# Patient Record
Sex: Female | Born: 1950
Health system: Southern US, Community
[De-identification: ages and names within clinical notes are randomized; demographics above are authoritative.]

## PROBLEM LIST (undated history)

## (undated) DIAGNOSIS — Z9289 Personal history of other medical treatment: Secondary | ICD-10-CM

## (undated) DIAGNOSIS — F32A Depression, unspecified: Secondary | ICD-10-CM

## (undated) DIAGNOSIS — Z973 Presence of spectacles and contact lenses: Secondary | ICD-10-CM

## (undated) DIAGNOSIS — IMO0001 Reserved for inherently not codable concepts without codable children: Secondary | ICD-10-CM

## (undated) DIAGNOSIS — R7303 Prediabetes: Secondary | ICD-10-CM

## (undated) DIAGNOSIS — J189 Pneumonia, unspecified organism: Secondary | ICD-10-CM

## (undated) DIAGNOSIS — K589 Irritable bowel syndrome without diarrhea: Secondary | ICD-10-CM

## (undated) DIAGNOSIS — M797 Fibromyalgia: Secondary | ICD-10-CM

## (undated) DIAGNOSIS — E119 Type 2 diabetes mellitus without complications: Secondary | ICD-10-CM

## (undated) DIAGNOSIS — K219 Gastro-esophageal reflux disease without esophagitis: Secondary | ICD-10-CM

## (undated) DIAGNOSIS — Z8709 Personal history of other diseases of the respiratory system: Secondary | ICD-10-CM

## (undated) DIAGNOSIS — R42 Dizziness and giddiness: Secondary | ICD-10-CM

## (undated) DIAGNOSIS — R519 Headache, unspecified: Secondary | ICD-10-CM

## (undated) DIAGNOSIS — B1920 Unspecified viral hepatitis C without hepatic coma: Secondary | ICD-10-CM

## (undated) DIAGNOSIS — G8929 Other chronic pain: Secondary | ICD-10-CM

## (undated) DIAGNOSIS — M1990 Unspecified osteoarthritis, unspecified site: Secondary | ICD-10-CM

## (undated) DIAGNOSIS — J449 Chronic obstructive pulmonary disease, unspecified: Secondary | ICD-10-CM

## (undated) DIAGNOSIS — F329 Major depressive disorder, single episode, unspecified: Secondary | ICD-10-CM

## (undated) DIAGNOSIS — F419 Anxiety disorder, unspecified: Secondary | ICD-10-CM

## (undated) HISTORY — DX: Other chronic pain: G89.29

## (undated) HISTORY — DX: Unspecified osteoarthritis, unspecified site: M19.90

## (undated) HISTORY — DX: Prediabetes: R73.03

## (undated) HISTORY — DX: Irritable bowel syndrome, unspecified: K58.9

## (undated) HISTORY — DX: Unspecified viral hepatitis C without hepatic coma: B19.20

## (undated) HISTORY — DX: Depression, unspecified: F32.A

## (undated) HISTORY — PX: UPPER GASTROINTESTINAL ENDOSCOPY: SHX188

## (undated) HISTORY — DX: Fibromyalgia: M79.7

## (undated) HISTORY — PX: COLONOSCOPY: SHX174

## (undated) HISTORY — PX: TONSILLECTOMY: SUR1361

## (undated) HISTORY — DX: Gastro-esophageal reflux disease without esophagitis: K21.9

## (undated) HISTORY — PX: CHOLECYSTECTOMY: SHX55

## (undated) HISTORY — DX: Major depressive disorder, single episode, unspecified: F32.9

---

## 1979-09-06 HISTORY — PX: MANDIBLE SURGERY: SHX707

## 1998-11-24 ENCOUNTER — Inpatient Hospital Stay (HOSPITAL_COMMUNITY): Admission: EM | Admit: 1998-11-24 | Discharge: 1998-11-25 | Payer: Self-pay | Admitting: *Deleted

## 1998-11-24 ENCOUNTER — Encounter: Payer: Self-pay | Admitting: *Deleted

## 1999-08-31 ENCOUNTER — Inpatient Hospital Stay (HOSPITAL_COMMUNITY): Admission: EM | Admit: 1999-08-31 | Discharge: 1999-09-03 | Payer: Self-pay | Admitting: *Deleted

## 2001-12-17 ENCOUNTER — Ambulatory Visit (HOSPITAL_COMMUNITY): Admission: RE | Admit: 2001-12-17 | Discharge: 2001-12-17 | Payer: Self-pay | Admitting: Internal Medicine

## 2001-12-17 ENCOUNTER — Encounter: Payer: Self-pay | Admitting: Internal Medicine

## 2002-06-06 ENCOUNTER — Ambulatory Visit (HOSPITAL_COMMUNITY): Admission: RE | Admit: 2002-06-06 | Discharge: 2002-06-06 | Payer: Self-pay | Admitting: Internal Medicine

## 2002-06-06 ENCOUNTER — Encounter (INDEPENDENT_AMBULATORY_CARE_PROVIDER_SITE_OTHER): Payer: Self-pay | Admitting: Internal Medicine

## 2002-06-12 ENCOUNTER — Inpatient Hospital Stay (HOSPITAL_COMMUNITY): Admission: RE | Admit: 2002-06-12 | Discharge: 2002-06-14 | Payer: Self-pay | Admitting: General Surgery

## 2003-08-01 ENCOUNTER — Ambulatory Visit (HOSPITAL_COMMUNITY): Admission: RE | Admit: 2003-08-01 | Discharge: 2003-08-01 | Payer: Self-pay | Admitting: Family Medicine

## 2003-08-07 ENCOUNTER — Ambulatory Visit (HOSPITAL_COMMUNITY): Admission: RE | Admit: 2003-08-07 | Discharge: 2003-08-07 | Payer: Self-pay | Admitting: Family Medicine

## 2004-01-19 ENCOUNTER — Ambulatory Visit (HOSPITAL_COMMUNITY): Admission: RE | Admit: 2004-01-19 | Discharge: 2004-01-19 | Payer: Self-pay | Admitting: Internal Medicine

## 2004-05-18 ENCOUNTER — Ambulatory Visit (HOSPITAL_COMMUNITY): Admission: RE | Admit: 2004-05-18 | Discharge: 2004-05-18 | Payer: Self-pay | Admitting: Family Medicine

## 2004-08-10 ENCOUNTER — Ambulatory Visit: Payer: Self-pay | Admitting: Psychiatry

## 2004-09-16 ENCOUNTER — Ambulatory Visit (HOSPITAL_COMMUNITY): Admission: RE | Admit: 2004-09-16 | Discharge: 2004-09-16 | Payer: Self-pay | Admitting: Family Medicine

## 2004-10-12 ENCOUNTER — Ambulatory Visit: Payer: Self-pay | Admitting: Psychiatry

## 2004-11-19 ENCOUNTER — Ambulatory Visit: Payer: Self-pay | Admitting: Psychology

## 2005-01-06 ENCOUNTER — Ambulatory Visit: Payer: Self-pay | Admitting: Psychology

## 2005-01-17 ENCOUNTER — Ambulatory Visit: Payer: Self-pay | Admitting: Internal Medicine

## 2005-01-20 ENCOUNTER — Ambulatory Visit: Payer: Self-pay | Admitting: Psychology

## 2005-03-22 ENCOUNTER — Ambulatory Visit: Payer: Self-pay | Admitting: Psychiatry

## 2005-05-05 ENCOUNTER — Ambulatory Visit: Payer: Self-pay | Admitting: Psychology

## 2005-06-21 ENCOUNTER — Ambulatory Visit: Payer: Self-pay | Admitting: Psychiatry

## 2005-08-10 ENCOUNTER — Ambulatory Visit: Payer: Self-pay | Admitting: Psychology

## 2005-10-04 ENCOUNTER — Ambulatory Visit: Payer: Self-pay | Admitting: Internal Medicine

## 2005-10-13 ENCOUNTER — Ambulatory Visit: Payer: Self-pay | Admitting: Psychology

## 2005-10-28 ENCOUNTER — Ambulatory Visit (HOSPITAL_COMMUNITY): Admission: RE | Admit: 2005-10-28 | Discharge: 2005-10-28 | Payer: Self-pay | Admitting: Family Medicine

## 2005-11-10 ENCOUNTER — Ambulatory Visit (HOSPITAL_COMMUNITY): Payer: Self-pay | Admitting: Psychology

## 2005-11-30 ENCOUNTER — Ambulatory Visit (HOSPITAL_COMMUNITY): Payer: Self-pay | Admitting: Psychology

## 2005-12-21 ENCOUNTER — Ambulatory Visit (HOSPITAL_COMMUNITY): Payer: Self-pay | Admitting: Psychology

## 2006-01-03 ENCOUNTER — Ambulatory Visit (HOSPITAL_COMMUNITY): Payer: Self-pay | Admitting: Psychiatry

## 2006-01-11 ENCOUNTER — Ambulatory Visit (HOSPITAL_COMMUNITY): Payer: Self-pay | Admitting: Psychology

## 2006-02-01 ENCOUNTER — Ambulatory Visit (HOSPITAL_COMMUNITY): Payer: Self-pay | Admitting: Psychology

## 2006-02-22 ENCOUNTER — Ambulatory Visit (HOSPITAL_COMMUNITY): Payer: Self-pay | Admitting: Psychology

## 2006-03-09 ENCOUNTER — Ambulatory Visit (HOSPITAL_COMMUNITY): Payer: Self-pay | Admitting: Psychiatry

## 2006-03-15 ENCOUNTER — Ambulatory Visit: Payer: Self-pay | Admitting: Internal Medicine

## 2006-03-27 ENCOUNTER — Ambulatory Visit (HOSPITAL_COMMUNITY): Payer: Self-pay | Admitting: Psychology

## 2006-04-10 ENCOUNTER — Ambulatory Visit (HOSPITAL_COMMUNITY): Payer: Self-pay | Admitting: Psychology

## 2006-05-01 ENCOUNTER — Ambulatory Visit (HOSPITAL_COMMUNITY): Payer: Self-pay | Admitting: Psychology

## 2006-05-22 ENCOUNTER — Ambulatory Visit (HOSPITAL_COMMUNITY): Payer: Self-pay | Admitting: Psychology

## 2006-05-25 ENCOUNTER — Ambulatory Visit (HOSPITAL_COMMUNITY): Payer: Self-pay | Admitting: Psychiatry

## 2006-06-08 ENCOUNTER — Ambulatory Visit (HOSPITAL_COMMUNITY): Admission: RE | Admit: 2006-06-08 | Discharge: 2006-06-08 | Payer: Self-pay | Admitting: Family Medicine

## 2006-07-06 ENCOUNTER — Ambulatory Visit (HOSPITAL_COMMUNITY): Payer: Self-pay | Admitting: Psychology

## 2006-08-01 ENCOUNTER — Ambulatory Visit (HOSPITAL_COMMUNITY): Payer: Self-pay | Admitting: Psychology

## 2006-08-07 ENCOUNTER — Ambulatory Visit (HOSPITAL_COMMUNITY): Payer: Self-pay | Admitting: Psychology

## 2006-08-22 ENCOUNTER — Ambulatory Visit (HOSPITAL_COMMUNITY): Payer: Self-pay | Admitting: Psychiatry

## 2006-11-01 ENCOUNTER — Ambulatory Visit (HOSPITAL_COMMUNITY): Payer: Self-pay | Admitting: Psychology

## 2006-11-16 ENCOUNTER — Ambulatory Visit (HOSPITAL_COMMUNITY): Payer: Self-pay | Admitting: Psychiatry

## 2006-11-20 ENCOUNTER — Ambulatory Visit (HOSPITAL_COMMUNITY): Payer: Self-pay | Admitting: Psychology

## 2006-12-13 ENCOUNTER — Ambulatory Visit (HOSPITAL_COMMUNITY): Payer: Self-pay | Admitting: Psychology

## 2006-12-27 ENCOUNTER — Ambulatory Visit (HOSPITAL_COMMUNITY): Payer: Self-pay | Admitting: Psychology

## 2007-01-17 ENCOUNTER — Ambulatory Visit (HOSPITAL_COMMUNITY): Payer: Self-pay | Admitting: Psychology

## 2007-02-01 ENCOUNTER — Ambulatory Visit (HOSPITAL_COMMUNITY): Payer: Self-pay | Admitting: Psychiatry

## 2007-02-07 ENCOUNTER — Ambulatory Visit (HOSPITAL_COMMUNITY): Payer: Self-pay | Admitting: Psychology

## 2007-03-22 ENCOUNTER — Ambulatory Visit (HOSPITAL_COMMUNITY): Payer: Self-pay | Admitting: Psychology

## 2007-04-23 ENCOUNTER — Ambulatory Visit (HOSPITAL_COMMUNITY): Payer: Self-pay | Admitting: Psychology

## 2007-05-14 ENCOUNTER — Ambulatory Visit (HOSPITAL_COMMUNITY): Payer: Self-pay | Admitting: Psychology

## 2007-05-15 ENCOUNTER — Ambulatory Visit (HOSPITAL_COMMUNITY): Payer: Self-pay | Admitting: Psychiatry

## 2007-05-23 ENCOUNTER — Emergency Department (HOSPITAL_COMMUNITY): Admission: EM | Admit: 2007-05-23 | Discharge: 2007-05-23 | Payer: Self-pay | Admitting: Emergency Medicine

## 2007-06-04 ENCOUNTER — Ambulatory Visit (HOSPITAL_COMMUNITY): Payer: Self-pay | Admitting: Psychology

## 2007-06-25 ENCOUNTER — Ambulatory Visit (HOSPITAL_COMMUNITY): Payer: Self-pay | Admitting: Psychology

## 2007-07-12 ENCOUNTER — Ambulatory Visit (HOSPITAL_COMMUNITY): Payer: Self-pay | Admitting: Psychology

## 2007-07-30 ENCOUNTER — Ambulatory Visit (HOSPITAL_COMMUNITY): Admission: RE | Admit: 2007-07-30 | Discharge: 2007-07-30 | Payer: Self-pay | Admitting: Family Medicine

## 2007-08-13 ENCOUNTER — Ambulatory Visit (HOSPITAL_COMMUNITY): Payer: Self-pay | Admitting: Psychology

## 2007-08-14 ENCOUNTER — Ambulatory Visit (HOSPITAL_COMMUNITY): Payer: Self-pay | Admitting: Psychiatry

## 2007-09-18 ENCOUNTER — Ambulatory Visit (HOSPITAL_COMMUNITY): Payer: Self-pay | Admitting: Psychology

## 2007-10-02 ENCOUNTER — Ambulatory Visit (HOSPITAL_COMMUNITY): Payer: Self-pay | Admitting: Psychology

## 2007-10-16 ENCOUNTER — Ambulatory Visit (HOSPITAL_COMMUNITY): Payer: Self-pay | Admitting: Psychology

## 2007-11-13 ENCOUNTER — Ambulatory Visit (HOSPITAL_COMMUNITY): Payer: Self-pay | Admitting: Psychiatry

## 2007-11-26 ENCOUNTER — Ambulatory Visit (HOSPITAL_COMMUNITY): Payer: Self-pay | Admitting: Psychology

## 2008-01-10 ENCOUNTER — Ambulatory Visit (HOSPITAL_COMMUNITY): Payer: Self-pay | Admitting: Psychiatry

## 2008-01-15 ENCOUNTER — Ambulatory Visit (HOSPITAL_COMMUNITY): Payer: Self-pay | Admitting: Psychology

## 2008-01-30 ENCOUNTER — Ambulatory Visit (HOSPITAL_COMMUNITY): Payer: Self-pay | Admitting: Psychology

## 2008-02-07 ENCOUNTER — Ambulatory Visit (HOSPITAL_COMMUNITY): Payer: Self-pay | Admitting: Psychiatry

## 2008-02-13 ENCOUNTER — Ambulatory Visit (HOSPITAL_COMMUNITY): Payer: Self-pay | Admitting: Psychology

## 2008-03-19 ENCOUNTER — Ambulatory Visit (HOSPITAL_COMMUNITY): Payer: Self-pay | Admitting: Psychology

## 2008-04-07 ENCOUNTER — Ambulatory Visit (HOSPITAL_COMMUNITY): Payer: Self-pay | Admitting: Psychology

## 2008-05-07 ENCOUNTER — Ambulatory Visit (HOSPITAL_COMMUNITY): Payer: Self-pay | Admitting: Psychology

## 2008-05-08 ENCOUNTER — Ambulatory Visit (HOSPITAL_COMMUNITY): Payer: Self-pay | Admitting: Psychiatry

## 2008-08-06 ENCOUNTER — Ambulatory Visit (HOSPITAL_COMMUNITY): Payer: Self-pay | Admitting: Psychology

## 2008-08-20 ENCOUNTER — Ambulatory Visit (HOSPITAL_COMMUNITY): Payer: Self-pay | Admitting: Psychology

## 2008-09-09 ENCOUNTER — Ambulatory Visit (HOSPITAL_COMMUNITY): Payer: Self-pay | Admitting: Psychiatry

## 2008-09-19 ENCOUNTER — Ambulatory Visit (HOSPITAL_COMMUNITY): Payer: Self-pay | Admitting: Psychology

## 2008-10-07 ENCOUNTER — Ambulatory Visit (HOSPITAL_COMMUNITY): Payer: Self-pay | Admitting: Psychology

## 2008-11-11 ENCOUNTER — Ambulatory Visit (HOSPITAL_COMMUNITY): Payer: Self-pay | Admitting: Psychology

## 2008-12-04 ENCOUNTER — Ambulatory Visit (HOSPITAL_COMMUNITY): Payer: Self-pay | Admitting: Psychiatry

## 2009-01-07 ENCOUNTER — Ambulatory Visit (HOSPITAL_COMMUNITY): Payer: Self-pay | Admitting: Psychology

## 2009-01-28 ENCOUNTER — Ambulatory Visit (HOSPITAL_COMMUNITY): Payer: Self-pay | Admitting: Psychology

## 2009-02-18 ENCOUNTER — Ambulatory Visit (HOSPITAL_COMMUNITY): Admission: RE | Admit: 2009-02-18 | Discharge: 2009-02-18 | Payer: Self-pay | Admitting: Family Medicine

## 2009-02-19 ENCOUNTER — Ambulatory Visit (HOSPITAL_COMMUNITY): Payer: Self-pay | Admitting: Psychology

## 2009-03-03 ENCOUNTER — Ambulatory Visit (HOSPITAL_COMMUNITY): Payer: Self-pay | Admitting: Psychiatry

## 2009-03-19 ENCOUNTER — Ambulatory Visit (HOSPITAL_COMMUNITY): Payer: Self-pay | Admitting: Psychiatry

## 2009-03-26 ENCOUNTER — Ambulatory Visit (HOSPITAL_COMMUNITY): Payer: Self-pay | Admitting: Psychology

## 2009-04-22 ENCOUNTER — Emergency Department (HOSPITAL_COMMUNITY): Admission: EM | Admit: 2009-04-22 | Discharge: 2009-04-23 | Payer: Self-pay | Admitting: Emergency Medicine

## 2009-04-28 ENCOUNTER — Ambulatory Visit (HOSPITAL_COMMUNITY): Payer: Self-pay | Admitting: Psychiatry

## 2009-05-03 ENCOUNTER — Emergency Department (HOSPITAL_COMMUNITY): Admission: EM | Admit: 2009-05-03 | Discharge: 2009-05-03 | Payer: Self-pay | Admitting: Emergency Medicine

## 2009-05-14 ENCOUNTER — Ambulatory Visit (HOSPITAL_COMMUNITY): Payer: Self-pay | Admitting: Psychiatry

## 2009-05-19 ENCOUNTER — Ambulatory Visit (HOSPITAL_COMMUNITY): Payer: Self-pay | Admitting: Psychology

## 2009-06-19 ENCOUNTER — Ambulatory Visit (HOSPITAL_COMMUNITY): Payer: Self-pay | Admitting: Psychology

## 2009-06-25 ENCOUNTER — Ambulatory Visit (HOSPITAL_COMMUNITY): Payer: Self-pay | Admitting: Psychiatry

## 2009-07-06 ENCOUNTER — Ambulatory Visit (HOSPITAL_COMMUNITY): Payer: Self-pay | Admitting: Psychology

## 2009-08-13 ENCOUNTER — Ambulatory Visit (HOSPITAL_COMMUNITY): Payer: Self-pay | Admitting: Psychology

## 2009-08-20 ENCOUNTER — Ambulatory Visit (HOSPITAL_COMMUNITY): Admission: RE | Admit: 2009-08-20 | Discharge: 2009-08-20 | Payer: Self-pay | Admitting: Family Medicine

## 2009-09-22 ENCOUNTER — Ambulatory Visit (HOSPITAL_COMMUNITY): Payer: Self-pay | Admitting: Psychiatry

## 2009-11-25 ENCOUNTER — Ambulatory Visit (HOSPITAL_COMMUNITY): Payer: Self-pay | Admitting: Psychology

## 2009-12-16 ENCOUNTER — Ambulatory Visit (HOSPITAL_COMMUNITY): Payer: Self-pay | Admitting: Psychology

## 2010-01-06 ENCOUNTER — Ambulatory Visit (HOSPITAL_COMMUNITY): Payer: Self-pay | Admitting: Psychology

## 2010-01-07 ENCOUNTER — Ambulatory Visit (HOSPITAL_COMMUNITY): Payer: Self-pay | Admitting: Psychiatry

## 2010-02-08 ENCOUNTER — Ambulatory Visit (HOSPITAL_COMMUNITY): Payer: Self-pay | Admitting: Psychology

## 2010-02-09 ENCOUNTER — Ambulatory Visit (HOSPITAL_COMMUNITY): Payer: Self-pay | Admitting: Psychiatry

## 2010-03-01 ENCOUNTER — Ambulatory Visit (HOSPITAL_COMMUNITY): Payer: Self-pay | Admitting: Psychology

## 2010-03-16 ENCOUNTER — Ambulatory Visit (HOSPITAL_COMMUNITY): Payer: Self-pay | Admitting: Psychology

## 2010-04-06 ENCOUNTER — Ambulatory Visit (HOSPITAL_COMMUNITY): Payer: Self-pay | Admitting: Psychiatry

## 2010-04-07 ENCOUNTER — Ambulatory Visit (HOSPITAL_COMMUNITY): Payer: Self-pay | Admitting: Psychology

## 2010-04-28 ENCOUNTER — Ambulatory Visit (HOSPITAL_COMMUNITY): Payer: Self-pay | Admitting: Psychology

## 2010-05-18 ENCOUNTER — Ambulatory Visit (HOSPITAL_COMMUNITY): Payer: Self-pay | Admitting: Psychology

## 2010-05-21 ENCOUNTER — Ambulatory Visit (HOSPITAL_COMMUNITY): Admission: RE | Admit: 2010-05-21 | Discharge: 2010-05-21 | Payer: Self-pay | Admitting: Family Medicine

## 2010-05-24 ENCOUNTER — Inpatient Hospital Stay (HOSPITAL_COMMUNITY): Admission: RE | Admit: 2010-05-24 | Discharge: 2010-05-28 | Payer: Self-pay | Admitting: Orthopedic Surgery

## 2010-06-24 ENCOUNTER — Ambulatory Visit (HOSPITAL_COMMUNITY): Payer: Self-pay | Admitting: Psychology

## 2010-08-11 ENCOUNTER — Ambulatory Visit (HOSPITAL_COMMUNITY): Payer: Self-pay | Admitting: Psychology

## 2010-08-24 ENCOUNTER — Ambulatory Visit (HOSPITAL_COMMUNITY): Payer: Self-pay | Admitting: Psychiatry

## 2010-08-25 ENCOUNTER — Ambulatory Visit (HOSPITAL_COMMUNITY): Payer: Self-pay | Admitting: Psychology

## 2010-09-14 ENCOUNTER — Ambulatory Visit: Admit: 2010-09-14 | Payer: Self-pay | Admitting: Internal Medicine

## 2010-09-21 ENCOUNTER — Ambulatory Visit (HOSPITAL_COMMUNITY)
Admission: RE | Admit: 2010-09-21 | Discharge: 2010-09-21 | Payer: Self-pay | Source: Home / Self Care | Attending: Psychiatry | Admitting: Psychiatry

## 2010-09-22 ENCOUNTER — Ambulatory Visit (HOSPITAL_COMMUNITY): Admit: 2010-09-22 | Payer: Self-pay | Admitting: Psychology

## 2010-09-24 ENCOUNTER — Ambulatory Visit (HOSPITAL_COMMUNITY)
Admission: RE | Admit: 2010-09-24 | Discharge: 2010-09-24 | Payer: Self-pay | Source: Home / Self Care | Attending: Psychology | Admitting: Psychology

## 2010-09-25 ENCOUNTER — Encounter: Payer: Self-pay | Admitting: Family Medicine

## 2010-09-26 ENCOUNTER — Encounter: Payer: Self-pay | Admitting: Family Medicine

## 2010-10-11 ENCOUNTER — Encounter (INDEPENDENT_AMBULATORY_CARE_PROVIDER_SITE_OTHER): Payer: Medicare Other | Admitting: Psychology

## 2010-10-11 DIAGNOSIS — F3289 Other specified depressive episodes: Secondary | ICD-10-CM

## 2010-10-11 DIAGNOSIS — F329 Major depressive disorder, single episode, unspecified: Secondary | ICD-10-CM

## 2010-10-11 DIAGNOSIS — F411 Generalized anxiety disorder: Secondary | ICD-10-CM

## 2010-10-19 ENCOUNTER — Ambulatory Visit (INDEPENDENT_AMBULATORY_CARE_PROVIDER_SITE_OTHER): Payer: Medicare Other | Admitting: Internal Medicine

## 2010-10-19 DIAGNOSIS — K219 Gastro-esophageal reflux disease without esophagitis: Secondary | ICD-10-CM

## 2010-10-19 DIAGNOSIS — B182 Chronic viral hepatitis C: Secondary | ICD-10-CM

## 2010-10-25 ENCOUNTER — Encounter (INDEPENDENT_AMBULATORY_CARE_PROVIDER_SITE_OTHER): Payer: Medicare Other | Admitting: Psychology

## 2010-10-25 DIAGNOSIS — F329 Major depressive disorder, single episode, unspecified: Secondary | ICD-10-CM

## 2010-10-25 DIAGNOSIS — F411 Generalized anxiety disorder: Secondary | ICD-10-CM

## 2010-11-08 ENCOUNTER — Encounter (INDEPENDENT_AMBULATORY_CARE_PROVIDER_SITE_OTHER): Payer: Medicare Other | Admitting: Psychology

## 2010-11-08 DIAGNOSIS — F411 Generalized anxiety disorder: Secondary | ICD-10-CM

## 2010-11-08 DIAGNOSIS — F329 Major depressive disorder, single episode, unspecified: Secondary | ICD-10-CM

## 2010-11-16 ENCOUNTER — Encounter (INDEPENDENT_AMBULATORY_CARE_PROVIDER_SITE_OTHER): Payer: Medicare Other | Admitting: Psychiatry

## 2010-11-16 DIAGNOSIS — F332 Major depressive disorder, recurrent severe without psychotic features: Secondary | ICD-10-CM

## 2010-11-18 LAB — TYPE AND SCREEN
ABO/RH(D): A NEG
Antibody Screen: NEGATIVE

## 2010-11-18 LAB — COMPREHENSIVE METABOLIC PANEL
ALT: 27 U/L (ref 0–35)
Alkaline Phosphatase: 67 U/L (ref 39–117)
CO2: 30 mEq/L (ref 19–32)
Chloride: 101 mEq/L (ref 96–112)
GFR calc non Af Amer: >60 mL/min (ref >60)
Glucose, Bld: 95 mg/dL (ref 70–99)
Potassium: 4.9 mEq/L (ref 3.5–5.1)
Sodium: 138 mEq/L (ref 135–145)
Total Protein: 7.8 g/dL (ref 6.0–8.3)

## 2010-11-18 LAB — CBC
HCT: 25.5 % — ABNORMAL LOW (ref 36.0–46.0)
HCT: 37.8 % (ref 36.0–46.0)
Hemoglobin: 13 g/dL (ref 12.0–15.0)
MCH: 29 pg (ref 26.0–34.0)
MCH: 29.3 pg (ref 26.0–34.0)
MCHC: 34 g/dL (ref 30.0–36.0)
MCHC: 34.3 g/dL (ref 30.0–36.0)
MCV: 85.4 fL (ref 78.0–100.0)
Platelets: 184 10*3/uL (ref 150–400)
Platelets: 184 10*3/uL (ref 150–400)
Platelets: 220 10*3/uL (ref 150–400)
RBC: 4.41 MIL/uL (ref 3.87–5.11)
RDW: 11.9 % (ref 11.5–15.5)
RDW: 11.9 % (ref 11.5–15.5)
RDW: 12 % (ref 11.5–15.5)
WBC: 6.4 10*3/uL (ref 4.0–10.5)
WBC: 6.4 10*3/uL (ref 4.0–10.5)

## 2010-11-18 LAB — BASIC METABOLIC PANEL
BUN: 4 mg/dL — ABNORMAL LOW (ref 6–23)
BUN: 5 mg/dL — ABNORMAL LOW (ref 6–23)
BUN: 8 mg/dL (ref 6–23)
CO2: 30 mEq/L (ref 19–32)
CO2: 33 mEq/L — ABNORMAL HIGH (ref 19–32)
Calcium: 8.2 mg/dL — ABNORMAL LOW (ref 8.4–10.5)
Calcium: 8.6 mg/dL (ref 8.4–10.5)
Chloride: 97 mEq/L (ref 96–112)
Creatinine, Ser: 0.63 mg/dL (ref 0.4–1.2)
Creatinine, Ser: 0.66 mg/dL (ref 0.4–1.2)
Creatinine, Ser: 0.67 mg/dL (ref 0.4–1.2)
GFR calc non Af Amer: >60 mL/min (ref >60)
GFR calc non Af Amer: >60 mL/min (ref >60)
Glucose, Bld: 139 mg/dL — ABNORMAL HIGH (ref 70–99)
Glucose, Bld: 147 mg/dL — ABNORMAL HIGH (ref 70–99)
Potassium: 4.3 mEq/L (ref 3.5–5.1)

## 2010-11-18 LAB — URINALYSIS, ROUTINE W REFLEX MICROSCOPIC
Glucose, UA: NEGATIVE mg/dL
pH: 6.5 (ref 5.0–8.0)

## 2010-11-18 LAB — PROTIME-INR
INR: 0.98 (ref 0.00–1.49)
INR: 1.04 (ref 0.00–1.49)
Prothrombin Time: 13.2 seconds (ref 11.6–15.2)
Prothrombin Time: 12.3 seconds (ref 11.6–15.2)

## 2010-11-22 ENCOUNTER — Other Ambulatory Visit (INDEPENDENT_AMBULATORY_CARE_PROVIDER_SITE_OTHER): Payer: Self-pay | Admitting: Internal Medicine

## 2010-11-22 DIAGNOSIS — B182 Chronic viral hepatitis C: Secondary | ICD-10-CM

## 2010-11-30 ENCOUNTER — Other Ambulatory Visit (INDEPENDENT_AMBULATORY_CARE_PROVIDER_SITE_OTHER): Payer: Self-pay | Admitting: Internal Medicine

## 2010-11-30 ENCOUNTER — Ambulatory Visit (HOSPITAL_COMMUNITY)
Admission: RE | Admit: 2010-11-30 | Discharge: 2010-11-30 | Disposition: A | Discharge status: Home / Self Care | Payer: Medicare Other | Source: Ambulatory Visit | Attending: Internal Medicine | Admitting: Internal Medicine

## 2010-11-30 ENCOUNTER — Other Ambulatory Visit: Payer: Self-pay | Admitting: Interventional Radiology

## 2010-11-30 ENCOUNTER — Ambulatory Visit (HOSPITAL_COMMUNITY): Payer: Medicare Other

## 2010-11-30 DIAGNOSIS — Z01812 Encounter for preprocedural laboratory examination: Secondary | ICD-10-CM | POA: Insufficient documentation

## 2010-11-30 DIAGNOSIS — B182 Chronic viral hepatitis C: Secondary | ICD-10-CM | POA: Insufficient documentation

## 2010-11-30 DIAGNOSIS — Z79899 Other long term (current) drug therapy: Secondary | ICD-10-CM | POA: Insufficient documentation

## 2010-11-30 LAB — PROTIME-INR: Prothrombin Time: 12.2 seconds (ref 11.6–15.2)

## 2010-11-30 LAB — CBC
HCT: 40.1 % (ref 36.0–46.0)
Hemoglobin: 12.9 g/dL (ref 12.0–15.0)
MCH: 26.8 pg (ref 26.0–34.0)
MCV: 83.2 fL (ref 78.0–100.0)
RBC: 4.82 MIL/uL (ref 3.87–5.11)

## 2010-12-01 ENCOUNTER — Encounter (INDEPENDENT_AMBULATORY_CARE_PROVIDER_SITE_OTHER): Payer: Medicare Other | Admitting: Psychology

## 2010-12-01 ENCOUNTER — Encounter (HOSPITAL_COMMUNITY): Payer: Medicare Other | Admitting: Psychology

## 2010-12-01 DIAGNOSIS — F329 Major depressive disorder, single episode, unspecified: Secondary | ICD-10-CM

## 2010-12-01 DIAGNOSIS — F411 Generalized anxiety disorder: Secondary | ICD-10-CM

## 2010-12-11 LAB — STOOL CULTURE

## 2010-12-11 LAB — BASIC METABOLIC PANEL
CO2: 28 mEq/L (ref 19–32)
CO2: 29 mEq/L (ref 19–32)
Calcium: 9.2 mg/dL (ref 8.4–10.5)
Chloride: 100 mEq/L (ref 96–112)
Chloride: 98 mEq/L (ref 96–112)
GFR calc Af Amer: >60 mL/min (ref >60)
GFR calc Af Amer: >60 mL/min (ref >60)
Potassium: 3.6 mEq/L (ref 3.5–5.1)
Sodium: 135 mEq/L (ref 135–145)
Sodium: 136 mEq/L (ref 135–145)

## 2010-12-11 LAB — CBC
HCT: 41.3 % (ref 36.0–46.0)
Hemoglobin: 12.7 g/dL (ref 12.0–15.0)
Hemoglobin: 13.9 g/dL (ref 12.0–15.0)
MCHC: 33.7 g/dL (ref 30.0–36.0)
MCHC: 34.6 g/dL (ref 30.0–36.0)
MCV: 85.2 fL (ref 78.0–100.0)
RBC: 4.36 MIL/uL (ref 3.87–5.11)
RBC: 4.86 MIL/uL (ref 3.87–5.11)

## 2010-12-11 LAB — DIFFERENTIAL
Basophils Relative: 0 % (ref 0–1)
Basophils Relative: 0 % (ref 0–1)
Eosinophils Absolute: 0 10*3/uL (ref 0.0–0.7)
Lymphs Abs: 1.4 10*3/uL (ref 0.7–4.0)
Monocytes Absolute: 0.6 10*3/uL (ref 0.1–1.0)
Monocytes Absolute: 0.8 10*3/uL (ref 0.1–1.0)
Monocytes Relative: 10 % (ref 3–12)
Monocytes Relative: 7 % (ref 3–12)
Neutro Abs: 4.5 10*3/uL (ref 1.7–7.7)

## 2010-12-11 LAB — RAPID URINE DRUG SCREEN, HOSP PERFORMED
Amphetamines: NOT DETECTED
Tetrahydrocannabinol: NOT DETECTED

## 2010-12-11 LAB — CLOSTRIDIUM DIFFICILE EIA

## 2010-12-11 LAB — OVA AND PARASITE EXAMINATION: Ova and parasites: NONE SEEN

## 2010-12-17 ENCOUNTER — Encounter (HOSPITAL_COMMUNITY): Payer: Medicare Other | Admitting: Psychology

## 2011-01-04 ENCOUNTER — Ambulatory Visit (INDEPENDENT_AMBULATORY_CARE_PROVIDER_SITE_OTHER): Payer: Medicare Other | Admitting: Internal Medicine

## 2011-01-06 ENCOUNTER — Ambulatory Visit (INDEPENDENT_AMBULATORY_CARE_PROVIDER_SITE_OTHER): Payer: Medicare Other | Admitting: Internal Medicine

## 2011-01-06 DIAGNOSIS — B182 Chronic viral hepatitis C: Secondary | ICD-10-CM

## 2011-01-07 ENCOUNTER — Encounter (INDEPENDENT_AMBULATORY_CARE_PROVIDER_SITE_OTHER): Payer: Medicare Other | Admitting: Psychology

## 2011-01-07 DIAGNOSIS — F329 Major depressive disorder, single episode, unspecified: Secondary | ICD-10-CM

## 2011-01-07 DIAGNOSIS — F411 Generalized anxiety disorder: Secondary | ICD-10-CM

## 2011-01-11 ENCOUNTER — Encounter (INDEPENDENT_AMBULATORY_CARE_PROVIDER_SITE_OTHER): Payer: Medicare Other | Admitting: Psychiatry

## 2011-01-11 DIAGNOSIS — F331 Major depressive disorder, recurrent, moderate: Secondary | ICD-10-CM

## 2011-01-21 ENCOUNTER — Encounter (INDEPENDENT_AMBULATORY_CARE_PROVIDER_SITE_OTHER): Payer: Medicare Other | Admitting: Psychology

## 2011-01-21 DIAGNOSIS — F329 Major depressive disorder, single episode, unspecified: Secondary | ICD-10-CM

## 2011-01-21 DIAGNOSIS — F411 Generalized anxiety disorder: Secondary | ICD-10-CM

## 2011-01-21 NOTE — Consult Note (Signed)
NAME:  Lauren [name], Lauren [name]                      ACCOUNT NO.:  [id number]   MEDICAL RECORD NO.:  [id number]                  PATIENT TYPE:   LOCATION:                                       FACILITY:  [facility]   PHYSICIAN:  Lionel December, M.D.                 DATE OF BIRTH:  [birth year]   DATE OF CONSULTATION:  12/23/2003  DATE OF DISCHARGE:                                   CONSULTATION   GASTROENTEROLOGY CONSULTATION:   REFERRING PHYSICIAN:  Corrie Mckusick, M.D.   CHIEF COMPLAINT:  Follow up chronic hepatitis C, due for screening  colonoscopy.   HISTORY OF PRESENT ILLNESS:  Ms. Lauren [name] is a 60 year old Caucasian  female with a history of chronic hepatitis C.  Her initial liver biopsy was  in August 2001 and she had another one in October 2003; there was no  progression of her disease whatsoever; the 2003 biopsy showed low-grade  chronic hepatitis, etiology unknown, without increased fibrosis; liver  function tests have been normal.  It is felt that she is not at high risk  for developing progressive disease with cirrhosis by Dr. Karilyn Cota therefore,  we have continued to monitor her.  She also has history of irritable bowel  syndrome and reports loose bowel movements approximately two times per week.  She has had multiple episodes of fecal incontinence as well.  She denies any  rectal bleeding, melena, or clay-colored stools.  Overall she has been  complaining of fatigue for years and was recently diagnosed with  fibromyalgia by a rheumatologist.  She has also had multiple episodes of  acute bronchitis and asthma exacerbations over the last couple months as  well.  She states she has had multiple stressful events in the last year or  so including death of both mother and father and a brother.  She denies any  rash or jaundice; she denies any darkening of her urine; she does have  history of chronic GERD which is well controlled on Nexium 40 mg b.i.d.; she  has taken Levsin in  the past however, she currently needs refill.   CURRENT MEDICATIONS:  1. Spiriva 18 mcg daily.  2. Clarinex 5 mg daily.  3. Singulair 10 mg at bedtime.  4. Albuterol inhaler p.r.n.  5. Xopenex nebs p.r.n.  6. Wellbutrin 150 mg daily.  7. Cymbalta 60 mg daily.  8. Lorazepam 1 mg t.i.d.  9. Vytorin 10/20 mg daily.  10.      Nexium 40 mg b.i.d.  11.      Flexeril 10 mg at bedtime.  12.      Ketek 400 mg daily.  13.      Advair 250 Diskus p.r.n.   ALLERGIES:  No known drug allergies.   PAST MEDICAL HISTORY:  1. Chronic GERD.  2. Fibromyalgia.  3. Acute bronchitis and asthma.  4. Chronic hepatitis C as described in HPI.  5. Rosacea.  6.  Depression.  7. Anxiety.  8. Right Achilles tendonitis.  9. Endometriosis diagnosed via laparoscopy in 1994.  10.      Tonsillectomy and jaw bone reconstruction in 1981 where she     underwent blood transfusion.   FAMILY HISTORY:  Mother is deceased secondary to CVA.  Father deceased  secondary to CVA at age 22.  She has recently lost one brother with renal  failure secondary to diabetes mellitus complications.  No known family  history of colorectal carcinoma, liver, or chronic GI problems.   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  She denies any current  tobacco use although she does have a remote history.  She denies heavy  alcohol use in the past however, she currently does not consume alcohol.  She denies any drug use.   REVIEW OF SYSTEMS:  CONSTITUTIONAL:  Weight is reportedly stable, her  appetite is good, she is complaining of fatigue however, she attributes this  to her fibromyalgia.  GI:  See HPI.  She denies any dysphagia or  odynophagia.  SKIN:  She denies any rash or jaundice.   PHYSICAL EXAMINATION:  VITAL SIGNS:  Weight 157.5 pounds.  Blood pressure  110/74, pulse 110.  GENERAL:  Ms. Lauren [name] is a 60 year old well-developed, well-nourished  Caucasian female in no acute distress.  She is alert, oriented, pleasant  and  cooperative.  HEENT:  Sclerae are clear, nonicteric.  Conjunctivae pink.  Oropharynx pink  and moist without any lesions.  NECK:  Supple without any mass or thyromegaly.  CHEST:  Heart regular rate and rhythm with normal S1, S2, without any  murmurs, clicks, rubs, or gallops.  LUNGS:  Clear to auscultation bilaterally.  ABDOMEN:  Positive bowel sounds x4, soft, nontender, nondistended.  No  palpable mass.  Liver is palpable just at the right costal margin to one  fingerbreadth below the right costal margin.  There is no palpable  splenomegaly.  EXTREMITIES:  Good pedal pulses bilaterally.  No edema.  SKIN:  Pink, warm and dry without any rashes or jaundice.  RECTAL:  Deferred.   ASSESSMENT:  1. Evaluated 60 year old Caucasian female with chronic hepatitis C, last     liver biopsy October 2003 without any progression of disease,     transaminases have remained normal.  2. Irritable bowel syndrome has been fairly well controlled.  She did report     good relief from cramping and loose stools with antispasmodic however,     she is currently in need of refill.  She is due for screening colonoscopy     and therefore, we will schedule this for her today given her age of 46.   RECOMMENDATIONS:  1. Prescription was given for Levsin sublingual to be taken up to t.i.d.     p.r.n. abdominal cramping (#90) with no refills.  2. Prescription was given for Nexium 40 mg one p.o. b.i.d. (#60) with 11     refills.  3. Labs today to include liver function tests.  We will schedule Ms. Elliott     for screening colonoscopy by Dr.     Karilyn Cota in the near future.  I have discussed this procedure including     risks and benefits which include but are not limited to bleeding,     infection, perforation, and drug reaction.  She agrees with this plan.     Consent will be obtained.     ________________________________________ ___________________________________________  Nicholas Lose, N.P.  Lionel December, M.D.   KC/MEDQ  D:  12/23/2003  T:  12/23/2003  Job:  [id number]   cc:   Corrie Mckusick, M.D.  [address] [place]. [place]  [place]  [place] [address]  Fax: [phone]

## 2011-01-21 NOTE — H&P (Signed)
NAME:  Lauren [name], Lauren [name]                      ACCOUNT NO.:  [id number]   MEDICAL RECORD NO.:  [id number]                   PATIENT TYPE:  AMB   LOCATION:  DAY                                  FACILITY:  [facility]   PHYSICIAN:  Jerolyn Shin C. Katrinka Blazing, M.D.                DATE OF BIRTH:  15-Jun-1951   DATE OF ADMISSION:  DATE OF DISCHARGE:                                HISTORY & PHYSICAL   HISTORY OF PRESENT ILLNESS:  A 60 year old female with a history of  recurrent abdominal pain for greater than three years.  She had reportedly  had an ultrasound of the gallbladder two years ago which was normal.  She  has had intermittent symptoms which have gotten much worse over the past  year.  She has had increasing nausea over the past few months.  Because of  recurrent symptoms, she had a repeat ultrasound which showed a large  gallstone with probable cystic duct obstruction.  She has had some fever and  chills, postprandial pain and nausea.  The patient is scheduled for  laparoscopic cholecystectomy.   PAST MEDICAL HISTORY:  She has hepatitis C, which has been positive for  quite some time.  It was caused by probable blood transfusion during an  operative procedure in 1981.  Biopsy in 2001 showed no major inflammatory  response, and her liver function studies have been normal.  She has  gastroesophageal reflux disease, depression, anxiety.   MEDICATIONS:  1. Nexium 40 mg b.i.d.  2. Ativan 1 mg q.i.d.  3. Wellbutrin 150 mg q.d.  4. Prempro one q.d.  5. Lexapro 10 mg q.d.  6. Naprosyn 220 mg p.r.n.   SOCIAL HISTORY:  She does not drink, smoke, or use drugs.   PAST SURGICAL HISTORY:  Her only surgery was jaw reconstruction in 1981 and  tonsillectomy.   PHYSICAL EXAMINATION:  GENERAL:  The patient appears to be in no acute  distress.  VITAL SIGNS:  Blood pressure 114/80, pulse 92, respirations 20, weight 154  pounds.  HEENT:  Unremarkable.  There is no evidence of jaundice.  NECK:  Supple  without JVD or bruit.  CHEST:  Clear to auscultation, no rales, rubs, rhonchi, or wheezes.  CARDIAC:  Regular rate and rhythm without murmur, gallop, or rub.  ABDOMEN:  Soft, nontender, no masses.  Normoactive bowel sounds.  EXTREMITIES:  No cyanosis, clubbing, or edema.  NEUROLOGIC:  No motor, sensory, or cerebellar deficit.   IMPRESSION:  1. Cholelithiasis with cholecystitis.  2. Hepatitis C.  3. Depression with anxiety.  4. Gastroesophageal reflux disease.   PLAN:  The patient will have laparoscopic cholecystectomy and laparoscopic  liver biopsy.  Dirk Dress. Katrinka Blazing, M.D.    LCS/MEDQ  D:  06/10/2002  T:  06/11/2002  Job:  [id number]

## 2011-01-21 NOTE — Op Note (Signed)
NAME:  Lauren [name], Lauren [name]                      ACCOUNT NO.:  [id number]   MEDICAL RECORD NO.:  [id number]                   PATIENT TYPE:  OBV   LOCATION:  A321                                 FACILITY:  [facility]   PHYSICIAN:  Dirk Dress. Katrinka Blazing, M.D.                DATE OF BIRTH:  08/17/1951   DATE OF PROCEDURE:  06/11/2002  DATE OF DISCHARGE:                                 OPERATIVE REPORT   PREOPERATIVE DIAGNOSIS:  1. Cholelithiasis.  2. Cholecystitis.  3. Hepatitis C.   POSTOPERATIVE DIAGNOSIS:  1. Cholelithiasis.  2. Cholecystitis.  3. Hepatitis C.   PROCEDURE:  1. Laparoscopic cholecystectomy.  2. Laparoscopic liver biopsy.   SURGEON:  Dirk Dress. Katrinka Blazing, M.D.   DESCRIPTION OF PROCEDURE:  Under general anesthesia the patient's abdomen  was prepped and draped in a sterile field.  A supraumbilical incision was  made; and a Veress needle was inserted without difficulty.  The abdomen was  insufflated with 2.5 liters of CO2 to a pressure of 16.  Using a Vis-A-Port  guide a 10-mm port was placed uneventfully.  A laparoscope was placed.  Under videoscopic guidance a 10-mm port was placed in right upper quadrant;  and two 5-mm ports were placed laterally.   The gallbladder was positioned.  The cystic duct was dissected and clipped  with 5 clips and divided.  The cystic artery branch was dissected, clipped  with 3 clips and divided.  Using electrocautery the gallbladder was then  dissected from the infrahepatic space without difficulty.  It was grasped  and retrieved.  It contained 1 large stone.  Hemostasis in the gallbladder  bed was achieved with electrocautery.  Irrigation was carried out to dilute  some bile that leaked.   CO2 was then allowed to escape from the abdomen so that we could have better  access to the liver.  A 6-inch TruCut needle was then passed through the  anterior abdominal wall.  The right lobe of the liver was biopsied with 2  cores of liver received without  difficulty.  The biopsy sites were  cauterized until they were no longer bleeding.  The patient tolerated the  procedure well.  The residual CO2 was allowed to escape from the abdomen and  the ports were removed.   The incisions were then closed using #0 Dexon on the fascia of the larger  incisions and staples on the skin.  The patient tolerated the procedure  well.  Dressings were placed.  She was awakened from anesthesia, transferred  to a bed, and taken to the postanesthetic care unit.                                               Dirk Dress. Katrinka Blazing, M.D.    LCS/MEDQ  D:  06/11/2002  T:  06/12/2002  Job:  [id number]

## 2011-01-21 NOTE — Discharge Summary (Signed)
   NAMEANASTASIA, TOMPSON                        ACCOUNT NO.:  [id number]   MEDICAL RECORD NO.:  [id number]                   PATIENT TYPE:   LOCATION:                                       FACILITY:   PHYSICIAN:  Dirk Dress. Katrinka Blazing, M.D.                DATE OF BIRTH:  1950-10-19   DATE OF ADMISSION:  06/12/2002  DATE OF DISCHARGE:  06/14/2002                                 DISCHARGE SUMMARY   DISCHARGE DIAGNOSES:  1. Cholelithiasis, cholecystitis.  2. Low grade chronic hepatitis due to hepatitis C.   SPECIAL PROCEDURE:  Laporascopic cholecystectomy with laporascopic liver  biopsy.   DISPOSITION:  The patient was discharged home in stable, satisfactory  condition with plans for followup in the office on October 16.   DISCHARGE MEDICATIONS:  1. Tylox 1 or 2 every 4 hours as needed for pain.  2. Phenergan 25 mg every 4 hours as needed for nausea.   SUMMARY:  A 60 year old female with history of recurrent abdominal pain for  greater than 3 years.  Ultrasound of the gallbladder 2 years ago was normal.  The patient had intermittent symptoms which got worse over the past year.  She had increase in nausea for the past few months.  Repeat ultrasound  showed a large gallstone with probable cystic duct obstruction.  She had  fever, chills, postprandial pain and nausea.  She was scheduled for  laporascopic cholecystectomy.  The patient's past history revealed that she  had hepatitis C which had been positive for quite some time.  She related  this to blood transfusion during an operative procedure in 1981.  Biopsy in  2001 showed no major inflammatory response and her liver function to be  normal.  I was asked by Dr. Susann Givens, her gastroenterologist, to do liver  biopsy during the surgery.  The patient was admitted for surgery and  underwent laporascopic cholecystectomy and laporascopic liver biopsy.  During her postoperative period, she had abdominal distension with nausea  and her  discharge was delayed.  She had emesis later that evening.  By  October 9, she felt much better.  Nausea persisted however.  She was given  Dulcolax tablets and continued on diet.  By the morning of October 10, she  was doing well.  She had had 3 bowel movements.  She had no nausea and  vomiting, vital signs were stable, abdomen was benign, and she was  discharged home in satisfactory condition.                                               Dirk Dress. Katrinka Blazing, M.D.    LCS/MEDQ  D:  08/25/2002  T:  08/26/2002  Job:  [id number]

## 2011-01-21 NOTE — Op Note (Signed)
NAME:  MODESTA, SAMMONS                      ACCOUNT NO.:  [id number]   MEDICAL RECORD NO.:  [id number]                   PATIENT TYPE:  AMB   LOCATION:  DAY                                  FACILITY:  [facility]   PHYSICIAN:  Lionel December, M.D.                 DATE OF BIRTH:  November 06, 1950   DATE OF PROCEDURE:  01/19/2004  DATE OF DISCHARGE:                                 OPERATIVE REPORT   PROCEDURE:  Total colonoscopy.   INDICATIONS:  Lauren [name] is a 60 year old Caucasian female who is undergoing  screening colonoscopy.  She does have intermittent diarrhea and cramping,  felt to have irritable bowel syndrome.  The procedure is reviewed with the  patient.  Informed consent was obtained.   PREMEDICATION:  Demerol 50 mg IV, Versed 11 mg IV in divided dose.   FINDINGS:  The procedure is performed in the endoscopy suite.  The patient's  vital signs and O2 saturations were monitored during the procedure and  remained stable.  The patient was placed in the left lateral decubitus  position and the rectal exam is performed.  No abnormality is noted in the  external or distal exam.  The Olympus video scope was placed in the rectum  and advanced under direct vision into the sigmoid colon and beyond.  A very  tortuous colon with some stool but the preparation was felt to be  satisfactory.  The scope was passed into the cecum which was identified by  the appendiceal orifice and ileocecal valve.  Pictures were taken for  accuracy.  As the scope was withdrawn, the colonic mucosa was once again  carefully examined, was normal throughout.  Her rectal mucosa similarly was  normal.  The scope was retroflexed and examined to the anorectal junction  which is unremarkable.  The scope was straightened and withdrawn.  The  patient tolerated the procedure well.   FINAL DIAGNOSIS:  Tortuous but normal colon.   RECOMMENDATIONS:  She will continue a high fiber diet and Levbid as before.  While she is here, we  also reviewed her liver function tests from Jan 14, 2004 which are within normal limits.  We therefore, would continue to  monitor her as far as her IBS is concerned.      ___________________________________________                                            Lionel December, M.D.   NR/MEDQ  D:  01/19/2004  T:  01/19/2004  Job:  [id number]   cc:   Corrie Mckusick, M.D.  [address] [place]. [place]  [place]  [place] [address]  Fax: [phone]

## 2011-02-03 ENCOUNTER — Encounter (HOSPITAL_COMMUNITY): Payer: Medicare Other | Admitting: Psychology

## 2011-03-24 ENCOUNTER — Encounter (INDEPENDENT_AMBULATORY_CARE_PROVIDER_SITE_OTHER): Payer: Medicare Other | Admitting: Psychology

## 2011-03-24 DIAGNOSIS — F329 Major depressive disorder, single episode, unspecified: Secondary | ICD-10-CM

## 2011-03-24 DIAGNOSIS — F3289 Other specified depressive episodes: Secondary | ICD-10-CM

## 2011-03-24 DIAGNOSIS — F411 Generalized anxiety disorder: Secondary | ICD-10-CM

## 2011-03-31 ENCOUNTER — Encounter (INDEPENDENT_AMBULATORY_CARE_PROVIDER_SITE_OTHER): Payer: Medicare Other | Admitting: Psychiatry

## 2011-03-31 DIAGNOSIS — F332 Major depressive disorder, recurrent severe without psychotic features: Secondary | ICD-10-CM

## 2011-04-05 ENCOUNTER — Encounter (HOSPITAL_COMMUNITY): Payer: Medicare Other | Admitting: Psychiatry

## 2011-04-07 ENCOUNTER — Encounter (INDEPENDENT_AMBULATORY_CARE_PROVIDER_SITE_OTHER): Payer: Medicare Other | Admitting: Psychology

## 2011-04-07 DIAGNOSIS — F411 Generalized anxiety disorder: Secondary | ICD-10-CM

## 2011-04-07 DIAGNOSIS — F329 Major depressive disorder, single episode, unspecified: Secondary | ICD-10-CM

## 2011-04-28 ENCOUNTER — Encounter (INDEPENDENT_AMBULATORY_CARE_PROVIDER_SITE_OTHER): Payer: Medicare Other | Admitting: Psychology

## 2011-04-28 DIAGNOSIS — F411 Generalized anxiety disorder: Secondary | ICD-10-CM

## 2011-04-28 DIAGNOSIS — F329 Major depressive disorder, single episode, unspecified: Secondary | ICD-10-CM

## 2011-05-12 ENCOUNTER — Encounter (HOSPITAL_COMMUNITY): Payer: Medicare Other | Admitting: Psychology

## 2011-05-19 ENCOUNTER — Encounter (INDEPENDENT_AMBULATORY_CARE_PROVIDER_SITE_OTHER): Payer: Medicare Other | Admitting: Psychology

## 2011-05-19 DIAGNOSIS — F411 Generalized anxiety disorder: Secondary | ICD-10-CM

## 2011-05-19 DIAGNOSIS — F329 Major depressive disorder, single episode, unspecified: Secondary | ICD-10-CM

## 2011-05-26 ENCOUNTER — Encounter (INDEPENDENT_AMBULATORY_CARE_PROVIDER_SITE_OTHER): Payer: Medicare Other | Admitting: Psychiatry

## 2011-05-26 DIAGNOSIS — F331 Major depressive disorder, recurrent, moderate: Secondary | ICD-10-CM

## 2011-06-08 ENCOUNTER — Encounter (INDEPENDENT_AMBULATORY_CARE_PROVIDER_SITE_OTHER): Admitting: Psychology

## 2011-06-08 DIAGNOSIS — F411 Generalized anxiety disorder: Secondary | ICD-10-CM

## 2011-06-08 DIAGNOSIS — F329 Major depressive disorder, single episode, unspecified: Secondary | ICD-10-CM

## 2011-06-16 LAB — POCT CARDIAC MARKERS
CKMB, poc: 1.2
Myoglobin, poc: 36.3
Operator id: 215201
Troponin i, poc: <0.05

## 2011-06-16 LAB — BASIC METABOLIC PANEL
Chloride: 104
Creatinine, Ser: 0.59
GFR calc Af Amer: >60
GFR calc non Af Amer: >60
Potassium: 4.3

## 2011-06-16 LAB — DIFFERENTIAL
Eosinophils Absolute: 0.1
Lymphocytes Relative: 30
Lymphs Abs: 2.3
Monocytes Relative: 8
Neutrophils Relative %: 60

## 2011-06-16 LAB — CBC
MCV: 85.6
RBC: 4.33
WBC: 7.7

## 2011-06-23 ENCOUNTER — Encounter (INDEPENDENT_AMBULATORY_CARE_PROVIDER_SITE_OTHER): Payer: Self-pay | Admitting: *Deleted

## 2011-07-19 ENCOUNTER — Encounter (HOSPITAL_COMMUNITY): Payer: Self-pay | Admitting: Psychology

## 2011-07-19 ENCOUNTER — Ambulatory Visit (INDEPENDENT_AMBULATORY_CARE_PROVIDER_SITE_OTHER): Admitting: Psychology

## 2011-07-19 DIAGNOSIS — F419 Anxiety disorder, unspecified: Secondary | ICD-10-CM

## 2011-07-19 DIAGNOSIS — F329 Major depressive disorder, single episode, unspecified: Secondary | ICD-10-CM

## 2011-07-19 DIAGNOSIS — F411 Generalized anxiety disorder: Secondary | ICD-10-CM

## 2011-07-19 NOTE — Progress Notes (Signed)
Patient:  Lauren [name]   DOB: [birth year]  MR Number: [id number]  Location: [facility] [address] [place] [address] Dept: [phone]  Start: 2 PM End: 3 PM  Provider/Observer:     Hershal Coria PSYD  Chief Complaint:      Chief Complaint  Patient presents with  . Depression  . Anxiety    Reason For Service:     The patient has a long history of major depressive condition and ongoing underlying depressive disorder. She is a recovering alcoholic but has many many years this Friday. Significant symptoms of anxiety have played a major role in substance abuse in the past. Significant medical problems and her inability to work have complicated her situation. When all these difficulties are put in the context and relationship to potential workability it is clear that she is disabled not do not expect her to return to gainful employment.  Interventions Strategy:  Cognitive/behavioral psychotherapy  Participation Level:   Active  Participation Quality:  Appropriate      Behavioral Observation:  Well Groomed, Alert, and Appropriate.   Current Psychosocial Factors: The patient reports that she and her husband have moved into their new house and has been quite a chore for her. 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 This was very emotional for the patient as she logically knows that there were reasons both at the workplace itself as well as her physical and mental status they left this an untenable situation it was nonetheless emotional for her and further instilled her feelings of helplessness and inadequacy.  Content of Session:   Review current symptoms and  continue to work on therapeutic interventions for improved coping skills and strategies.  Current Status:   The patient has done fairly well given her history. She was able to work at one job for about 6 months before this was too overwhelming for her. We are going to continue to look at other types of jobs that she can do if it only a part-time level.  Patient Progress:   Stable and improving  Target Goals:   Reduce the frequency of depressive events including feelings of hopelessness and helplessness, anhedonia, social withdrawal, as well as significant anxiety symptoms related to social avoidance, fear of abandonment and fear of inability to manage and cope with her world. We will also look at continuing complete sobriety.  Last Reviewed:   07/19/2011  Goals Addressed Today:    Today we worked on issues related to reducing the frequency of her days that are overwhelmed by free density of helplessness and hopelessness and anhedonia. We also worked on issues related to anxiety as well.  Impression/Diagnosis:   The patient has a long history of depressive disorder and underlying anxiety disorder. She has had numerous medical issues including severe fibromyalgia and severe arthritis. She has been diagnosed with significant arthritis throughout her body and in particular her hands and her feet. Numerous events and left are overwhelmed and unable to work. The patient has had periodic times of improvement for primary symptoms but there are other times where the symptoms are so problematic that she has not been able to maintain gainful employment.  Diagnosis:  Axis I:  1. Depressive disorder   2. Anxiety         Axis II: No diagnosis

## 2011-07-21 ENCOUNTER — Encounter (HOSPITAL_COMMUNITY): Payer: Medicare Other | Admitting: Psychiatry

## 2011-07-21 ENCOUNTER — Ambulatory Visit (INDEPENDENT_AMBULATORY_CARE_PROVIDER_SITE_OTHER): Admitting: Psychiatry

## 2011-07-21 ENCOUNTER — Encounter (HOSPITAL_COMMUNITY): Payer: Self-pay | Admitting: Psychiatry

## 2011-07-21 VITALS — Wt 134.0 lb

## 2011-07-21 DIAGNOSIS — F331 Major depressive disorder, recurrent, moderate: Secondary | ICD-10-CM

## 2011-07-21 MED ORDER — LORAZEPAM 1 MG PO TABS: ORAL_TABLET | ORAL | Status: DC

## 2011-07-21 MED ORDER — TRAZODONE HCL 100 MG PO TABS: 100.0000 mg | ORAL_TABLET | Freq: Every day | ORAL | Status: DC

## 2011-07-21 NOTE — Progress Notes (Signed)
Patient came for her followup appointment she's been compliant with her medication. Recently she has noticed more fibromyalgia pain and seen her rheumatologist recommended to take 90 mg of Cymbalta. Patient has just started taking 90 mg in past 2 days so far she's been tolerating okay and reported no side effects of medication. She's been sleeping good but the trazodone and continues to take Ativan 1-2 tablet at that time. 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 Overall her anxiety depression is controlled she's been seen therapist in this office on regular basis. She denies any agitation anger mood swings or paranoid thinking. She feels her current medication working very well. She does not drink or use drugs. She does not abuse her Ativan.  Mental status examination patient is casually dressed fairly groomed her speech is fast but clear and coherent. She denies any active or passive suicidal thinking. Her attention and concentration is fair. There no psychotic symptoms present. She's alert and oriented x3. Her insight judgment impulse control is okay.  Assessment Major depressive disorder. Anxiety disorder  Plan. I will continue trazodone 100 mg at bedtime and Ativan 1 mg one to 2 tablet at bedtime. I recommended to see her neurologist go is now managing Cymbalta and had increased the dose to 90 mg patient agreed to followup with her rheumatologist for continue use of Cymbalta. I explained the risks and benefits of medication. I will see her again in 2 months.

## 2011-08-09 ENCOUNTER — Ambulatory Visit (HOSPITAL_COMMUNITY): Admitting: Psychology

## 2011-08-19 ENCOUNTER — Ambulatory Visit (INDEPENDENT_AMBULATORY_CARE_PROVIDER_SITE_OTHER): Admitting: Psychology

## 2011-08-19 DIAGNOSIS — F331 Major depressive disorder, recurrent, moderate: Secondary | ICD-10-CM

## 2011-08-19 DIAGNOSIS — F411 Generalized anxiety disorder: Secondary | ICD-10-CM

## 2011-08-19 DIAGNOSIS — F419 Anxiety disorder, unspecified: Secondary | ICD-10-CM
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
## 2011-08-25 ENCOUNTER — Other Ambulatory Visit (HOSPITAL_COMMUNITY): Payer: Self-pay | Admitting: *Deleted

## 2011-08-26 ENCOUNTER — Ambulatory Visit (INDEPENDENT_AMBULATORY_CARE_PROVIDER_SITE_OTHER): Admitting: Psychology

## 2011-08-26 DIAGNOSIS — F332 Major depressive disorder, recurrent severe without psychotic features: Secondary | ICD-10-CM

## 2011-08-26 DIAGNOSIS — F419 Anxiety disorder, unspecified: Secondary | ICD-10-CM

## 2011-08-26 DIAGNOSIS — F411 Generalized anxiety disorder: Secondary | ICD-10-CM

## 2011-08-26 NOTE — Progress Notes (Signed)
Patient:  Lauren [name]   DOB: 01/09/1951  MR Number: [id number]  Location: BEHAVIORAL [facility] [address] [place] [address] Dept: [phone]  Start: 2 PM End: 3 PM  Provider/Observer:     Hershal Coria PSYD  Chief Complaint:      Chief Complaint  Patient presents with  . Anxiety  . Depression  . Manic Behavior  . Stress    Reason For Service:     The patient has a long history of major depressive condition and ongoing underlying depressive disorder. She is a recovering alcoholic but has many many years this Friday. Significant symptoms of anxiety have played a major role in substance abuse in the past. Significant medical problems and her inability to work have complicated her situation. When all these difficulties are put in the context and relationship to potential workability it is clear that she is disabled not do not expect her to return to gainful employment.  Interventions Strategy:  Cognitive/behavioral psychotherapy  Participation Level:   Active  Participation Quality:  Appropriate      Behavioral Observation:  Well Groomed, Alert, and Appropriate.   Current Psychosocial Factors: The patient has had a number of situations recently which really triggered her intrusive feelings of worthlessness and helplessness. These happen both at her church setting as well as with one of her friends.  Content of Session:   Review current symptoms and continue to work on therapeutic interventions for improved coping skills and strategies.  Current Status:   The patient has done fairly well given her history. She was able to work at one job for about 6 months before this was too overwhelming for her. We are going to continue to look at other types of jobs that she can do if it only a part-time level.  Patient Progress:   Stable and improving  Target Goals:   Reduce the frequency of depressive  events including feelings of hopelessness and helplessness, anhedonia, social withdrawal, as well as significant anxiety symptoms related to social avoidance, fear of abandonment and fear of inability to manage and cope with her world. We will also look at continuing complete sobriety.  Last Reviewed:   08/26/2011  Goals Addressed Today:    Today we worked on issues related to a significant worsening in her depression and anxiety. The primary goals had to do with her cognitive aspects of interpreting herself says that she is worthless and that other people are constantly having negative use of her.   Impression/Diagnosis:   The patient has a long history of depressive disorder and underlying anxiety disorder. She has had numerous medical issues including severe fibromyalgia and severe arthritis. She has been diagnosed with significant arthritis throughout her body and in particular her hands and her feet. Numerous events and left are overwhelmed and unable to work. The patient has had periodic times of improvement for primary symptoms but there are other times where the symptoms are so problematic that she has not been able to maintain gainful employment.  Diagnosis:    Axis I:  1. Major depressive disorder, recurrent episode, severe degree, without mention of psychotic behavior   2. Anxiety         Axis II: No diagnosis

## 2011-09-05 MED ORDER — TRAZODONE HCL 100 MG PO TABS: 100.0000 mg | ORAL_TABLET | Freq: Every day | ORAL | Status: DC

## 2011-09-07 ENCOUNTER — Ambulatory Visit (INDEPENDENT_AMBULATORY_CARE_PROVIDER_SITE_OTHER): Admitting: Psychology

## 2011-09-07 ENCOUNTER — Ambulatory Visit (HOSPITAL_COMMUNITY): Admitting: Psychology

## 2011-09-07 DIAGNOSIS — F331 Major depressive disorder, recurrent, moderate: Secondary | ICD-10-CM

## 2011-09-08 ENCOUNTER — Encounter (HOSPITAL_COMMUNITY): Payer: Self-pay | Admitting: Psychology

## 2011-09-08 NOTE — Progress Notes (Signed)
Patient:  Lauren [name]   DOB: [birth year]  MR Number: [id number]  Location: BEHAVIORAL [facility] [address] [place] [address] Dept: [phone]  Start: 2 PM End: 3 PM  Provider/Observer:     Hershal Coria PSYD  Chief Complaint:      Chief Complaint  Patient presents with  . Anxiety  . Depression  . Stress  . Trauma    Reason For Service:     The patient has a long history of major depressive condition and ongoing underlying depressive disorder. She is a recovering alcoholic but has many many years this Friday. Significant symptoms of anxiety have played a major role in substance abuse in the past. Significant medical problems and her inability to work have complicated her situation. When all these difficulties are put in the context and relationship to potential workability it is clear that she is disabled not do not expect her to return to gainful employment.  Interventions Strategy:  Cognitive/behavioral psychotherapy  Participation Level:   Active  Participation Quality:  Appropriate      Behavioral Observation:  Well Groomed, Alert, and Appropriate.   Current Psychosocial Factors: The patient has had a number of situations recently which really triggered her intrusive feelings of worthlessness and helplessness. These happen both at her church setting as well as with one of her friends.  Content of Session:   Review current symptoms and continue to work on therapeutic interventions for improved coping skills and strategies.  Current Status:   The patient has done fairly well given her history. She was able to work at one job for about 6 months before this was too overwhelming for her. We are going to continue to look at other types of jobs that she can do if it only a part-time level.  Patient Progress:   Stable and improving  Target Goals:   Reduce the frequency of depressive events  including feelings of hopelessness and helplessness, anhedonia, social withdrawal, as well as significant anxiety symptoms related to social avoidance, fear of abandonment and fear of inability to manage and cope with her world. We will also look at continuing complete sobriety.  Last Reviewed:   09/07/2011  Goals Addressed Today:    We continue to work on issues related to her current level of emotional distress. This time and he has always been a difficult one for her and has typical things have worsened. She verbalizes a desire to stop taking her narcotic pain medications and the conflicts with the use of these and angiolytics create with her participation in AA continue to be major issues for her. We worked on a plan for her to stop taking narcotic pain medications..   Impression/Diagnosis:   The patient has a long history of depressive disorder and underlying anxiety disorder. She has had numerous medical issues including severe fibromyalgia and severe arthritis. She has been diagnosed with significant arthritis throughout her body and in particular her hands and her feet. Numerous events and left are overwhelmed and unable to work. The patient has had periodic times of improvement for primary symptoms but there are other times where the symptoms are so problematic that she has not been able to maintain gainful employment.  Diagnosis:    Axis I:  1. Major depressive disorder, recurrent, moderate         Axis II: No diagnosis

## 2011-09-12 ENCOUNTER — Ambulatory Visit (HOSPITAL_COMMUNITY): Admitting: Psychology

## 2011-09-20 ENCOUNTER — Ambulatory Visit (INDEPENDENT_AMBULATORY_CARE_PROVIDER_SITE_OTHER): Admitting: Psychiatry

## 2011-09-20 DIAGNOSIS — F339 Major depressive disorder, recurrent, unspecified: Secondary | ICD-10-CM

## 2011-09-20 DIAGNOSIS — F329 Major depressive disorder, single episode, unspecified: Secondary | ICD-10-CM

## 2011-09-20 MED ORDER — LORAZEPAM 1 MG PO TABS: ORAL_TABLET | ORAL | Status: DC

## 2011-09-20 MED ORDER — TRAZODONE HCL 150 MG PO TABS: 150.0000 mg | ORAL_TABLET | Freq: Every day | ORAL | Status: DC

## 2011-09-20 NOTE — Progress Notes (Signed)
Patient came today for her followup appointment. She reported depression and having hysterical crying. She reported she has not been sleeping good for past 2 weeks. She reported she tried Cymbalta 90 mg but that make her very tired and groggy and now she is been cutting down her Cymbalta 30 mg and does not feel Cymbalta is helping her depression and anxiety and father watch her very well. Patient appears somewhat frustrated and irritable.  She has recently seen her rheumatologist who started on Savella however patient has not started yet. She admitted having crying spells constant pain and difficult night due do nervousness. She admitted taking more trazodone without calling us and when I confronted why she become more irritable and start crying. Patient has history of been demanding and irritable behavior. Though she denies any active or passive suicidal thoughts but her mood is been very labile. At this time she is taking Cymbalta 30 mg so that she should not go into withdrawal symptoms.  Mental status examination Patient is fairly groomed and appears to be her stated age. She maintained poor eye contact. Her behavior is irritable and at times labile mood. She denies any active or passive suicidal thoughts or homicidal thoughts. Her attention and concentration is distracted at times. Her speech is rambling with increased volume. At times she is pressured and difficult to stop. She denies any auditory or visual hallucination. There no psychotic symptoms present however she has lost mood lability. She's alert and oriented x3. Her insight judgment is fair. Her impulse control is okay.  Assessment Maj. depressive disorder  Plan I have a long discussion with the patient at this time I believe patient should try Savella for her fibromyalgia which is also antidepressant. However I recommended to stop Cymbalta as she cannot take to antidepressant together. I also increase her trazodone to 150 mg at bedtime  however recommended that in the future if she change her dosage then she need to call us. I will continue her Ativan which she has been taking as prescribed. I will see her again in 2 weeks if she has not shown any improvement with Savella then we will consider starting a new antidepressant. I recommended to call us if she has any question or concern or if she feels worsening of the symptoms otherwise I will see her again in 3 weeks. She will continue to see therapist in this office for increase coping and social skills.   become more irritable were night

## 2011-10-04 ENCOUNTER — Ambulatory Visit (HOSPITAL_COMMUNITY): Admitting: Psychiatry

## 2011-10-07 ENCOUNTER — Ambulatory Visit (INDEPENDENT_AMBULATORY_CARE_PROVIDER_SITE_OTHER): Admitting: Psychology

## 2011-10-07 DIAGNOSIS — F331 Major depressive disorder, recurrent, moderate: Secondary | ICD-10-CM

## 2011-10-10 ENCOUNTER — Encounter (HOSPITAL_COMMUNITY): Payer: Self-pay | Admitting: Psychology

## 2011-10-10 NOTE — Progress Notes (Signed)
Patient:  Lauren [name]   DOB: [birth year]  MR Number: [id number]  Location: BEHAVIORAL [facility] [address] [place] [address] Dept: [phone]  Start: 4 PM End: 5 PM  Provider/Observer:     Hershal Coria PSYD  Chief Complaint:      Chief Complaint  Patient presents with  . Stress  . Anxiety  . Depression  . Agitation    Reason For Service:     The patient has a long history of major depressive condition and ongoing underlying depressive disorder. She is a recovering alcoholic but has many many years this Friday. Significant symptoms of anxiety have played a major role in substance abuse in the past. Significant medical problems and her inability to work have complicated her situation. When all these difficulties are put in the context and relationship to potential workability it is clear that she is disabled not do not expect her to return to gainful employment.  Interventions Strategy:  Cognitive/behavioral psychotherapy  Participation Level:   Active  Participation Quality:  Appropriate      Behavioral Observation:  Well Groomed, Alert, and Appropriate.   Current Psychosocial Factors: The patient has been doing better with her situation and relationship with her husband. However, a recent setback with regard to a medication that she was "help her with her pain in mood has left her quite upset.  Content of Session:   Review current symptoms and continue to work on therapeutic interventions for improved coping skills and strategies.  Current Status:   The patient has done fairly well given her history. She was able to work at one job for about 6 months before this was too overwhelming for her. We are going to continue to look at other types of jobs that she can do if it only a part-time level.  Patient Progress:   Stable and improving  Target Goals:   Reduce the frequency of depressive  events including feelings of hopelessness and helplessness, anhedonia, social withdrawal, as well as significant anxiety symptoms related to social avoidance, fear of abandonment and fear of inability to manage and cope with her world. We will also look at continuing complete sobriety.  Last Reviewed:   10/07/2011  Goals Addressed Today:    The patient came in today feeling very upset about her recent failure on different medications that she is tried. She realizes that she is reaching and searching for fixes for her chronic pain/fibromyalgia condition and that her sensitivity and reaction to several different types of medication classes have been problematic but she continues to try different things that end up failing.   Impression/Diagnosis:   The patient has a long history of depressive disorder and underlying anxiety disorder. She has had numerous medical issues including severe fibromyalgia and severe arthritis. She has been diagnosed with significant arthritis throughout her body and in particular her hands and her feet. Numerous events and left are overwhelmed and unable to work. The patient has had periodic times of improvement for primary symptoms but there are other times where the symptoms are so problematic that she has not been able to maintain gainful employment.  Diagnosis:    Axis I:  1. Major depressive disorder, recurrent, moderate         Axis II: No diagnosis

## 2011-10-25 ENCOUNTER — Encounter (HOSPITAL_COMMUNITY): Payer: Self-pay | Admitting: Psychiatry

## 2011-10-25 ENCOUNTER — Ambulatory Visit (INDEPENDENT_AMBULATORY_CARE_PROVIDER_SITE_OTHER): Admitting: Psychiatry

## 2011-10-25 DIAGNOSIS — F339 Major depressive disorder, recurrent, unspecified: Secondary | ICD-10-CM

## 2011-10-25 DIAGNOSIS — F329 Major depressive disorder, single episode, unspecified: Secondary | ICD-10-CM

## 2011-10-25 MED ORDER — DULOXETINE HCL 60 MG PO CPEP: 60.0000 mg | ORAL_CAPSULE | Freq: Every day | ORAL | Status: DC

## 2011-10-25 MED ORDER — TRAZODONE HCL 100 MG PO TABS: 100.0000 mg | ORAL_TABLET | Freq: Every day | ORAL | Status: DC

## 2011-10-25 MED ORDER — LORAZEPAM 1 MG PO TABS: ORAL_TABLET | ORAL | Status: DC

## 2011-10-25 NOTE — Progress Notes (Signed)
Chief complaint I stopped Savella and restarted cymbalta  History of presenting illness Patient is 61 year old Caucasian female who came for her followup appointment. She was recently prescribed Savella by her primary care physician. She was told to stop Cymbalta as she cannot take to antidepressant. Patient complained of manic-like symptoms Savella and do not feel any improvement in her fibromyalgia. She had stopped that medication and restart taking Cymbalta which is helping her mood sleep and some pain. She is also taking hydrocodone on a regular basis for fibromyalgia pain. Since she started taking Cymbalta her mood is pleasant and she denies any agitation anger or frustration. She denies any crying spells. She also cut down her trazodone 200 mg which was increased to 150 on the last visit. She denies abusing her Ativan. She denies any drinking or using illicit drugs. She reported no side effects of medication.  Past psychiatric history Patient has a long history of depression. She has history of inpatient psychiatry treatment. She was last admitted in 2010 at old [facility] due to change mental status. She had tried multiple medication in the pas including Lexapro Wellbutrin letter to however she usually respond well on Cymbalta.  Medical history Patient has history of fibromyalgia, osteoarthritis, IBS, and GERD and chronic pain  Mental status examination Patient is fairly groomed and well dressed. She appears to be her stated age. She maintained good eye contact. She is pleasant and cooperative. She described her mood is pleasant and her affect is mood congruent. She denies any active or passive suicidal thoughts or homicidal thoughts. Her attention and concentration is improved from the past. Her speech is clear and coherent. She denies any auditory or visual hallucination. There no psychotic symptoms present at this time. She's alert and oriented x3. Her insight judgment and impulse  control is okay.  Assessment Axis I Maj. depressive disorder Axis II deferred Axis III see medical history Axis IV mild to moderate  Plan We will restart Cymbalta 60 mg daily as patient doing better on Cymbalta. She has discontinued Savella. She will require Ativan the she's been taking 1-2 mg as needed. I will also continue trazodone 100 mg which she was taking before. I have explained risks and benefits of medication. She will continue to see therapist in this office for increase coping and social skills. Time spent 20 minutes

## 2011-10-27 ENCOUNTER — Ambulatory Visit (INDEPENDENT_AMBULATORY_CARE_PROVIDER_SITE_OTHER): Admitting: Psychology

## 2011-10-27 DIAGNOSIS — F419 Anxiety disorder, unspecified: Secondary | ICD-10-CM

## 2011-10-27 DIAGNOSIS — F331 Major depressive disorder, recurrent, moderate: Secondary | ICD-10-CM

## 2011-10-27 DIAGNOSIS — F411 Generalized anxiety disorder: Secondary | ICD-10-CM

## 2011-11-23 ENCOUNTER — Encounter (HOSPITAL_COMMUNITY): Payer: Self-pay | Admitting: Psychology

## 2011-11-23 NOTE — Progress Notes (Signed)
Patient:  Lauren [name]   DOB: [birth year]  MR Number: [id number]  Location: BEHAVIORAL [facility] [address] [place] [address] Dept: [phone]  Start: 4 PM End: 5 PM  Provider/Observer:     Hershal Coria PSYD  Chief Complaint:      Chief Complaint  Patient presents with  . Manic Behavior  . Anxiety  . Stress    Reason For Service:     The patient has a long history of major depressive condition and ongoing underlying depressive disorder. She is a recovering alcoholic but has many many years this Friday. Significant symptoms of anxiety have played a major role in substance abuse in the past. Significant medical problems and her inability to work have complicated her situation. When all these difficulties are put in the context and relationship to potential workability it is clear that she is disabled not do not expect her to return to gainful employment.  Interventions Strategy:  Cognitive/behavioral psychotherapy  Participation Level:   Active  Participation Quality:  Appropriate      Behavioral Observation:  Well Groomed, Alert, and Appropriate.   Current Psychosocial Factors: The patient has been doing better with her situation and relationship with her husband. However, a recent setback with regard to a medication that she was "help her with her pain in mood has left her quite upset.  Content of Session:   Review current symptoms and continue to work on therapeutic interventions for improved coping skills and strategies.  Current Status:   The patient has done fairly well given her history. She was able to work at one job for about 6 months before this was too overwhelming for her. We are going to continue to look at other types of jobs that she can do if it only a part-time level.  Patient Progress:   Stable and improving  Target Goals:   Reduce the frequency of depressive events  including feelings of hopelessness and helplessness, anhedonia, social withdrawal, as well as significant anxiety symptoms related to social avoidance, fear of abandonment and fear of inability to manage and cope with her world. We will also look at continuing complete sobriety.  Last Reviewed:   10/27/2011  Goals Addressed Today:    The patient came in today feeling very upset about her recent failure on different medications that she is tried. She realizes that she is reaching and searching for fixes for her chronic pain/fibromyalgia condition and that her sensitivity and reaction to several different types of medication classes have been problematic but she continues to try different things that end up failing.   Impression/Diagnosis:   The patient has a long history of depressive disorder and underlying anxiety disorder. She has had numerous medical issues including severe fibromyalgia and severe arthritis. She has been diagnosed with significant arthritis throughout her body and in particular her hands and her feet. Numerous events and left are overwhelmed and unable to work. The patient has had periodic times of improvement for primary symptoms but there are other times where the symptoms are so problematic that she has not been able to maintain gainful employment.  Diagnosis:    Axis I:  1. Major depressive disorder, recurrent, moderate   2. Anxiety         Axis II: No diagnosis

## 2011-12-22 ENCOUNTER — Ambulatory Visit (HOSPITAL_COMMUNITY): Admitting: Psychiatry

## 2011-12-27 ENCOUNTER — Encounter (HOSPITAL_COMMUNITY): Payer: Self-pay | Admitting: Psychiatry

## 2011-12-27 ENCOUNTER — Ambulatory Visit (INDEPENDENT_AMBULATORY_CARE_PROVIDER_SITE_OTHER): Payer: Medicare Other | Admitting: Psychiatry

## 2011-12-27 VITALS — Wt 135.0 lb

## 2011-12-27 DIAGNOSIS — F32A Depression, unspecified: Secondary | ICD-10-CM | POA: Insufficient documentation

## 2011-12-27 DIAGNOSIS — F329 Major depressive disorder, single episode, unspecified: Secondary | ICD-10-CM

## 2011-12-27 MED ORDER — TRAZODONE HCL 100 MG PO TABS: 100.0000 mg | ORAL_TABLET | Freq: Every day | ORAL | Status: DC

## 2011-12-27 MED ORDER — MIRTAZAPINE 15 MG PO TBDP: 15.0000 mg | ORAL_TABLET | Freq: Every day | ORAL | Status: DC

## 2011-12-27 NOTE — Progress Notes (Signed)
Chief complaint I want to stop Ativan.    History of presenting illness Patient is 61 year old Caucasian female who came for her followup appointment.  She is taking Cymbalta 60 mg daily.  She likes Cymbalta.  She complained of diarrhea for past few weeks and missed appointment .  Her diarrhea is much resolved now.  She wants to stop Ativan as she feels she is dependent on this medication.  She likes to try a different medication for sleep and anxiety.  She wants to try Remeron however she has never tried before.  Overall her depression anxiety is better .  She denies any agitation anger or mood swings.  She denies any crying spells.  She does not want to take more trazodone due to excessive sedation and tired feeling in the morning.  She's not drinking or using any illegal substance.  Current psychiatric medication Cymbalta 60 mg daily Trazodone 100 mg at bedtime Ativan 1 mg 1-2 tablet as needed   Past psychiatric history Patient has a long history of depression. She has history of inpatient psychiatry treatment. She was last admitted in 2010 at old [facility] due to change mental status. She had tried multiple medication in the past including Lexapro Wellbutrin however she usually respond well on Cymbalta.  Medical history Patient has history of fibromyalgia, osteoarthritis, IBS, and GERD and chronic pain  Mental status examination Patient is fairly groomed and well dressed. She appears to be her stated age. She maintained fair eye contact. She is pleasant and cooperative. She described her mood is ok. She denies any active or passive suicidal thoughts or homicidal thoughts. Her attention and concentration is improved from the past. Her speech is clear and coherent. She denies any auditory or visual hallucination. There no psychotic symptoms present at this time. She's alert and oriented x3. Her insight judgment and impulse control is okay.  Assessment Axis I Maj. depressive  disorder Axis II deferred Axis III see medical history Axis IV mild to moderate  Plan I will discontinue Ativan since patient liked to try different medication for sleep.  I explained possible risk of withdrawal symptoms and if that happened then she need to call us if we get a.  I will start Remeron 15 mg at bedtime .  I recommended if she cannot sleep and she can take trazodone .  I explained risks and benefits especially weight gain with Remeron .  I recommended to call us if she has any question or concern about the medication.  I will see her again in 4 weeks.

## 2012-01-05 ENCOUNTER — Ambulatory Visit (INDEPENDENT_AMBULATORY_CARE_PROVIDER_SITE_OTHER): Payer: Medicare Other | Admitting: Psychology

## 2012-01-05 DIAGNOSIS — F419 Anxiety disorder, unspecified: Secondary | ICD-10-CM

## 2012-01-05 DIAGNOSIS — F331 Major depressive disorder, recurrent, moderate: Secondary | ICD-10-CM

## 2012-01-05 DIAGNOSIS — F411 Generalized anxiety disorder: Secondary | ICD-10-CM

## 2012-01-06 ENCOUNTER — Encounter (HOSPITAL_COMMUNITY): Payer: Self-pay | Admitting: Psychology

## 2012-01-06 NOTE — Progress Notes (Signed)
Patient:  Lauren [name]   DOB: [birth year]  MR Number: [id number]  Location: BEHAVIORAL [facility] [address] [place] [address] Dept: [phone]  Start: 4 PM End: 5 PM  Provider/Observer:     Hershal Coria PSYD  Chief Complaint:      Chief Complaint  Patient presents with  . Anxiety  . Depression  . Stress    Reason For Service:     The patient has a long history of major depressive condition and ongoing underlying depressive disorder. She is a recovering alcoholic but has many many years this Friday. Significant symptoms of anxiety have played a major role in substance abuse in the past. Significant medical problems and her inability to work have complicated her situation. When all these difficulties are put in the context and relationship to potential workability it is clear that she is disabled not do not expect her to return to gainful employment.  Interventions Strategy:  Cognitive/behavioral psychotherapy  Participation Level:   Active  Participation Quality:  Appropriate      Behavioral Observation:  Well Groomed, Alert, and Appropriate.   Current Psychosocial Factors: The patient and her husband came in today with her husband reporting some of the difficulties that she has been having recently. He reports that she is constantly having crying spells and becoming despondent and repeating responses about guilty feelings do to past history. The patient acknowledges this and we worked on coping skills around issues of past deal.  Content of Session:   Review current symptoms and continue to work on therapeutic interventions for improved coping skills and strategies.  Current Status:   The patient has stopped working do to difficulties that this produced and is having difficulty coping with past feelings.  Patient Progress:   Stable and improving  Target Goals:   Reduce the frequency of  depressive events including feelings of hopelessness and helplessness, anhedonia, social withdrawal, as well as significant anxiety symptoms related to social avoidance, fear of abandonment and fear of inability to manage and cope with her world. We will also look at continuing complete sobriety.  Last Reviewed:   01/06/2012  Goals Addressed Today:    Target goals addressed today have to do with issues of depression in particular feelings of helplessness and hopelessness.   Impression/Diagnosis:   The patient has a long history of depressive disorder and underlying anxiety disorder. She has had numerous medical issues including severe fibromyalgia and severe arthritis. She has been diagnosed with significant arthritis throughout her body and in particular her hands and her feet. Numerous events and left are overwhelmed and unable to work. The patient has had periodic times of improvement for primary symptoms but there are other times where the symptoms are so problematic that she has not been able to maintain gainful employment.  Diagnosis:    Axis I:  1. Major depressive disorder, recurrent, moderate   2. Anxiety         Axis II: No diagnosis

## 2012-01-09 ENCOUNTER — Encounter (INDEPENDENT_AMBULATORY_CARE_PROVIDER_SITE_OTHER): Payer: Self-pay | Admitting: *Deleted

## 2012-01-19 ENCOUNTER — Ambulatory Visit (HOSPITAL_COMMUNITY): Payer: Self-pay | Admitting: Psychology

## 2012-01-26 ENCOUNTER — Ambulatory Visit (INDEPENDENT_AMBULATORY_CARE_PROVIDER_SITE_OTHER): Payer: Medicare Other | Admitting: Psychiatry

## 2012-01-26 ENCOUNTER — Encounter (HOSPITAL_COMMUNITY): Payer: Self-pay | Admitting: Psychiatry

## 2012-01-26 VITALS — Wt 138.0 lb

## 2012-01-26 DIAGNOSIS — F329 Major depressive disorder, single episode, unspecified: Secondary | ICD-10-CM

## 2012-01-26 MED ORDER — DIVALPROEX SODIUM 250 MG PO DR TAB: DELAYED_RELEASE_TABLET | ORAL | Status: DC

## 2012-01-26 NOTE — Progress Notes (Addendum)
Chief complaint I I have reaction from the medication.      History of presenting illness Patient is 61 year old Caucasian female who came for her followup appointment.  Patient came with her boyfriend [place] .  Patient endorse that she took Remeron however she could not go to sleep and she took trazodone and then she noticed her brain is wired .  She could not concentrate and having severe anxiety attack.  Next day she stopped taking Remeron and trazodone .  Patient endorse she likes Cymbalta however she is wondering if all 3 medications cause significant side effects.  Her boyfriend also endorse that patient was very anxious after taking out 3 medication.  Patient also try a different medication however she does not want any controlled substance .  She denies any agitation anger and mood swing but endorse chronic anxiety and depressive thoughts.  She has some crying spells but overall she denies any active or passive suicidal thoughts.  She's not thinking or using any illegal substance. She gained some weight from Remeron but she liked.  She liked to gain some weight.  Weight 138 pounds. Current psychiatric medication Cymbalta 60 mg daily Trazodone 100 mg at bedtime, currently stopped. Remeron 15 mg at bedtime currently stopped.  Past psychiatric history Patient has a long history of depression. She has history of inpatient psychiatry treatment. She was last admitted in 2010 at old [facility] due to change mental status. She had tried multiple medication in the past including Lexapro Wellbutrin however she usually respond well on Cymbalta.  Medical history Patient has history of fibromyalgia, osteoarthritis, IBS, and GERD and chronic pain  Mental status examination Patient is fairly groomed and well dressed. She is anxious but cooperative.  Her speech is fast at times rambling and pressured.  Her thought process is also fast but logical.  She described her mood is anxious and her affect  is mood congruent.  She denies any active or passive suicidal thoughts or homicidal thoughts.  Her attention and concentration is distracted at times.  She denies any auditory or visual hallucination.  There no psychotic symptoms present at this time.  She's alert and oriented x3 and her insight judgment and impulse control is okay.  Assessment Axis I Maj. depressive disorder Axis II deferred Axis III see medical history Axis IV mild to moderate  Plan I I explained to the patient and her boyfriend that she was told that she could have a withdrawal symptoms by stopping Ativan.  She was also told to not take 3 antidepressant together however patient admitted that she was unable to sleep that day and took trazodone.  Patient may likely have serotonin syndrome which is not resolved.  I recommend to discontinue trazodone and Remeron and tried Depakote 250 mg at bedtime for one week and slow titration to 500 mg at bedtime.  I explained the risks and benefits of medication including metabolic side effects and weight gain.  Patient like to gain weight. I also recommend to call us if she has any question or concern about the medication or if she feels worsening of the symptoms.  She will stop trazodone and Remeron.  I also encouraged her to see therapist for coping and social skills.  I will see her again in 2 weeks.  Time spent 30 minutes.

## 2012-02-01 ENCOUNTER — Telehealth (HOSPITAL_COMMUNITY): Payer: Self-pay | Admitting: *Deleted

## 2012-02-01 ENCOUNTER — Other Ambulatory Visit (HOSPITAL_COMMUNITY): Payer: Self-pay | Admitting: Psychiatry

## 2012-02-02 ENCOUNTER — Other Ambulatory Visit (HOSPITAL_COMMUNITY): Payer: Self-pay | Admitting: Psychiatry

## 2012-02-02 DIAGNOSIS — F329 Major depressive disorder, single episode, unspecified: Secondary | ICD-10-CM

## 2012-02-02 MED ORDER — LORAZEPAM 1 MG PO TABS: ORAL_TABLET | ORAL | Status: DC

## 2012-02-02 NOTE — Telephone Encounter (Signed)
Call returned and spoke to the patient.  Patient complained of some mild tremors and insomnia.  However they are different tremors which she had in the past with 3 antidepressant.  I recommend to continue Depakote since in the initial phase Depakote can cause mild tremors.  I will order lorazepam 1 mg. She can take half to one tablet as needed for insomnia.  I recommend to call us if her tremor does not resolve in one week.

## 2012-02-02 NOTE — Progress Notes (Signed)
Chief complaint I I have reaction from the medication.      History of presenting illness Patient is 61-year-old Caucasian female who came for her followup appointment.  Patient came with her boyfriend [place] .  Patient endorse that she took Remeron however she could not go to sleep and she took trazodone and then she noticed her brain is wired .  She could not concentrate and having severe anxiety attack.  Next day she stopped taking Remeron and trazodone .  Patient endorse she likes Cymbalta however she is wondering if all 3 medications cause significant side effects.  Her boyfriend also endorse that patient was very anxious after taking out 3 medication.  Patient also try a different medication however she does not want any controlled substance .  She denies any agitation anger and mood swing but endorse chronic anxiety and depressive thoughts.  She has some crying spells but overall she denies any active or passive suicidal thoughts.  She's not thinking or using any illegal substance. She gained some weight from Remeron but she liked.  She liked to gain some weight.  Weight 138 pounds. Current psychiatric medication Cymbalta 60 mg daily Trazodone 100 mg at bedtime, currently stopped. Remeron 15 mg at bedtime currently stopped.  Past psychiatric history Patient has a long history of depression. She has history of inpatient psychiatry treatment. She was last admitted in 2010 at old [facility] due to change mental status. She had tried multiple medication in the past including Lexapro Wellbutrin however she usually respond well on Cymbalta.  Medical history Patient has history of fibromyalgia, osteoarthritis, IBS, and GERD and chronic pain  Mental status examination Patient is fairly groomed and well dressed. She is anxious but cooperative.  Her speech is fast at times rambling and pressured.  Her thought process is also fast but logical.  She described her mood is anxious and her affect  is mood congruent.  She denies any active or passive suicidal thoughts or homicidal thoughts.  Her attention and concentration is distracted at times.  She denies any auditory or visual hallucination.  There no psychotic symptoms present at this time.  She's alert and oriented x3 and her insight judgment and impulse control is okay.  Assessment Axis I Maj. depressive disorder Axis II deferred Axis III see medical history Axis IV mild to moderate  Plan I I explained to the patient and her boyfriend that she was told that she could have a withdrawal symptoms by stopping Ativan.  She was also told to not take 3 antidepressant together however patient admitted that she was unable to sleep that day and took trazodone.  Patient may likely have serotonin syndrome which is not resolved.  I recommend to discontinue trazodone and Remeron and tried Depakote 250 mg at bedtime for one week and slow titration to 500 mg at bedtime.  I explained the risks and benefits of medication including metabolic side effects and weight gain.  Patient like to gain weight. I also recommend to call us if she has any question or concern about the medication or if she feels worsening of the symptoms.  She will stop trazodone and Remeron.  I also encouraged her to see therapist for coping and social skills.  I will see her again in 2 weeks.  Time spent 30 minutes.  

## 2012-02-07 ENCOUNTER — Encounter (HOSPITAL_COMMUNITY): Payer: Self-pay | Admitting: Psychology

## 2012-02-07 ENCOUNTER — Ambulatory Visit (INDEPENDENT_AMBULATORY_CARE_PROVIDER_SITE_OTHER): Payer: Medicare Other | Admitting: Psychology

## 2012-02-07 DIAGNOSIS — F419 Anxiety disorder, unspecified: Secondary | ICD-10-CM

## 2012-02-07 DIAGNOSIS — F411 Generalized anxiety disorder: Secondary | ICD-10-CM

## 2012-02-07 DIAGNOSIS — F39 Unspecified mood [affective] disorder: Secondary | ICD-10-CM

## 2012-02-07 NOTE — Progress Notes (Signed)
Patient:  Lauren [name]   DOB: 14-Mar-1951  MR Number: [id number]  Location: BEHAVIORAL [facility] [address] [place] [address] Dept: [phone]  Start: 2 PM End: 3 PM  Provider/Observer:     Hershal Coria PSYD  Chief Complaint:      Chief Complaint  Patient presents with  . Stress  . Anxiety  . Depression  . Trauma    Reason For Service:     The patient has a long history of major depressive condition and ongoing underlying depressive disorder. She is a recovering alcoholic but has many many years this Friday. Significant symptoms of anxiety have played a major role in substance abuse in the past. Significant medical problems and her inability to work have complicated her situation. When all these difficulties are put in the context and relationship to potential workability it is clear that she is disabled not do not expect her to return to gainful employment.  Interventions Strategy:  Cognitive/behavioral psychotherapy  Participation Level:   Active  Participation Quality:  Appropriate      Behavioral Observation:  Well Groomed, Alert, and Appropriate.   Current Psychosocial Factors: The patient comes in today again very upset and brings her husband along with the recording how much distress she was in when she tried to stop taking thoracic pain. She only takes it at night before she goes to bed when she does not have it she reports that she feels extremely poor. We talked about how she will stop it for 3 or 4 days and as she starts to feel very poorly she will start taking it again which essentially the feet the whole process..  Content of Session:   Review current symptoms and continue to work on therapeutic interventions for improved coping skills and strategies.  Current Status:   The patient continues to be extremely agitated and manic.  Patient Progress:   Stable and  improving  Target Goals:   Reduce the frequency of depressive events including feelings of hopelessness and helplessness, anhedonia, social withdrawal, as well as significant anxiety symptoms related to social avoidance, fear of abandonment and fear of inability to manage and cope with her world. We will also look at continuing complete sobriety.  Last Reviewed:   02/07/2012  Goals Addressed Today:    Target goals addressed today have to do with issues of depression in particular feelings of helplessness and hopelessness.   Impression/Diagnosis:   The patient has a long history of depressive disorder and underlying anxiety disorder. She has had numerous medical issues including severe fibromyalgia and severe arthritis. She has been diagnosed with significant arthritis throughout her body and in particular her hands and her feet. Numerous events and left are overwhelmed and unable to work. The patient has had periodic times of improvement for primary symptoms but there are other times where the symptoms are so problematic that she has not been able to maintain gainful employment.  Diagnosis:    Axis I:  1. Mood disorder   2. Anxiety         Axis II: No diagnosis

## 2012-02-09 ENCOUNTER — Ambulatory Visit (HOSPITAL_COMMUNITY): Payer: Self-pay | Admitting: Psychiatry

## 2012-02-14 ENCOUNTER — Encounter (INDEPENDENT_AMBULATORY_CARE_PROVIDER_SITE_OTHER): Payer: Self-pay | Admitting: Internal Medicine

## 2012-02-14 ENCOUNTER — Ambulatory Visit (INDEPENDENT_AMBULATORY_CARE_PROVIDER_SITE_OTHER): Payer: Medicare Other | Admitting: Internal Medicine

## 2012-02-14 VITALS — BP 130/80 | HR 84 | Temp 97.7°F | Resp 20 | Ht 66.0 in | Wt 138.5 lb

## 2012-02-14 DIAGNOSIS — M179 Osteoarthritis of knee, unspecified: Secondary | ICD-10-CM | POA: Insufficient documentation

## 2012-02-14 DIAGNOSIS — M1710 Unilateral primary osteoarthritis, unspecified knee: Secondary | ICD-10-CM | POA: Insufficient documentation

## 2012-02-14 DIAGNOSIS — B1920 Unspecified viral hepatitis C without hepatic coma: Secondary | ICD-10-CM

## 2012-02-14 DIAGNOSIS — K589 Irritable bowel syndrome without diarrhea: Secondary | ICD-10-CM | POA: Insufficient documentation

## 2012-02-14 LAB — CBC
HCT: 37.1 % (ref 36.0–46.0)
MCH: 26.5 pg (ref 26.0–34.0)
MCHC: 33.2 g/dL (ref 30.0–36.0)
MCV: 80 fL (ref 78.0–100.0)
RDW: 15 % (ref 11.5–15.5)
WBC: 6.8 10*3/uL (ref 4.0–10.5)

## 2012-02-14 NOTE — Patient Instructions (Addendum)
Can take Lomotil/diphenoxylate one tablet every morning and  subsequent doses as needed. Fiber supplement 3-4 g daily. Physician will contact you with results of blood work.

## 2012-02-14 NOTE — Progress Notes (Signed)
Presenting complaint;  Followup for chronic hepatitis C and IBS.  Subjective:  Patient is 61 year old Caucasian female who is here for yearly visit. She has been advised by her primary care physician to schedule colonoscopy she believes her last exam was less than 10 years ago. She continues to have diarrhea easily controlled with when necessary use of Lomotil. She denies melena or rectal bleeding. Diarrhea has improved somewhat since she stopped her Nexium. She has changed her eating habits and rarely experiences heartburn. Her appetite is normal. Her weight last year was 139 pounds. She denies abdominal pain pruritus weakness or fatigue. She also is not having any problems with fluid retention.  Current Medications: Current Outpatient Prescriptions  Medication Sig Dispense Refill  . diphenoxylate-atropine (LOMOTIL) 2.5-0.025 MG per tablet Take 1 tablet by mouth as needed.      . DULoxetine (CYMBALTA) 60 MG capsule Take 1 capsule (60 mg total) by mouth daily.  90 capsule  0  . HYDROcodone-acetaminophen (NORCO) 10-325 MG per tablet 1 tablet as needed.       Marland Kitchen LORazepam (ATIVAN) 1 MG tablet Take 1/2 to 1 tab as need for insomnia.  30 tablet  0  . pravastatin (PRAVACHOL) 20 MG tablet Take 20 mg by mouth daily.       . divalproex (DEPAKOTE) 250 MG DR tablet Take 1 tab every night for one week and than 2 tab every night  60 tablet  0     Objective: Blood pressure 130/80, pulse 84, temperature 97.7 F (36.5 C), temperature source Oral, resp. rate 20, height 5\' 6"  (1.676 m), weight 138 lb 8 oz (62.823 kg). Patient is alert and in no acute distress. Conjunctiva is pink. Sclera is nonicteric Oropharyngeal mucosa is normal. No neck masses or thyromegaly noted. Cardiac exam with regular rhythm normal S1 and S2. No murmur or gallop noted. Lungs are clear to auscultation. Abdomen is flat and soft without tenderness organomegaly or masses.  No LE edema or clubbing noted.  Data reviewed; Last  colonoscopy was normal performed in May 2005. Liver biopsy in March 2012 revealed  grade 1 inflammation and stage 0 fibrosis.   Assessment:  #1. Chronic hepatitis C diagnosed over 10 years ago. Biopsy 15 months ago revealed small disease and/or fibrosis. Current treatment and centered not to be appropriate for this patient with history of depression. She would have an opportunity to be treated with known interferon-based therapies which possibly would be available next year. #2. Irritable bowel syndrome with diarrhea.   Plan:  She will go to the lab for CBC and LFTs. Take Lomotil one tablet by mouth every morning and second or third dose as needed. Fiber supplement 4 g by mouth daily. Office visit in one year. Screening colonoscopy due in May 2015.

## 2012-02-15 LAB — HEPATIC FUNCTION PANEL
ALT: 25 U/L (ref 0–35)
Albumin: 4.1 g/dL (ref 3.5–5.2)
Indirect Bilirubin: 0.3 mg/dL (ref 0.0–0.9)
Total Protein: 7.2 g/dL (ref 6.0–8.3)

## 2012-02-16 ENCOUNTER — Other Ambulatory Visit (HOSPITAL_COMMUNITY): Payer: Self-pay | Admitting: Psychiatry

## 2012-02-24 ENCOUNTER — Ambulatory Visit (HOSPITAL_COMMUNITY): Payer: Self-pay | Admitting: Psychology

## 2012-03-06 ENCOUNTER — Ambulatory Visit (INDEPENDENT_AMBULATORY_CARE_PROVIDER_SITE_OTHER): Payer: Medicare Other | Admitting: Psychiatry

## 2012-03-06 ENCOUNTER — Encounter (HOSPITAL_COMMUNITY): Payer: Self-pay | Admitting: Psychiatry

## 2012-03-06 DIAGNOSIS — F329 Major depressive disorder, single episode, unspecified: Secondary | ICD-10-CM

## 2012-03-06 MED ORDER — LAMOTRIGINE 25 MG PO TABS: ORAL_TABLET | ORAL | Status: DC

## 2012-03-06 MED ORDER — TRAZODONE HCL 100 MG PO TABS: 100.0000 mg | ORAL_TABLET | Freq: Every day | ORAL | Status: DC

## 2012-03-06 NOTE — Progress Notes (Signed)
Chief complaint I stopped taking Depakote.  It was causing me leg cramps.  I went to see [facility] physician who gave me Ativan 1 mg 120 tablet.  I'm taking 2 tablet every night.  History of presenting illness Patient is 61 year old Caucasian female who came for her followup appointment with her boyfriend [place] .  Patient is stopped taking Depakote due to side effects.  She was very anxious and having panic attack and recently visited her primary care physician at [facility] physician and physician Christiane Ha had prescribed Ativan 1 mg tablet up to 4 times a day.  She was given 120 tablet.  However she is taking 2 tablet at bedtime.  When I confronted why she need to get Ativan from her physician since it is been prescribed by this office she feel bad and reported that I was not able to see you anytime soon.  She is taking pain medication for her fibromyalgia.  She denies any abusing pain medication or Ativan.  She continues to have depressive thoughts and anxiety symptoms.  As per boyfriend patient sometime endorse paranoid thinking that people or talking about her, she also gets very emotional tearful and having crying episodes.  She does not leave her home and stays most of the time by herself.  Her isolation and feeling of hopelessness is been getting worse.  She sleeping better with Ativan however she continued to endorse chronic depression and social isolation.  She likes Cymbalta.  She is not taking Remeron at this time.  She's not drinking or using any illegal substance.   Current psychiatric medication Cymbalta 60 mg daily Trazodone 100 mg at bedtime Ativan 1 mg 2 tablet at bedtime prescribed by primary care physician  Past psychiatric history Patient has a long history of depression. She has history of inpatient psychiatry treatment. She was last admitted in 2010 at old [facility] due to change mental status. She had tried multiple medication in the past including Lexapro Wellbutrin however she  usually respond well on Cymbalta.  Medical history Patient has history of fibromyalgia, osteoarthritis, IBS, and GERD and chronic pain  Mental status examination Patient is fairly groomed and well dressed. She is anxious emotional but cooperative.  Her speech is fast at times rambling and pressured.  Her thought process is also fast but logical.  She described her mood is anxious and her affect is mood congruent.  She denies any active or passive suicidal thoughts or homicidal thoughts.  She endorse some paranoia that people are talking about her but she denies any hallucination. Her attention and concentration is distracted at times.  There were no obsession present at this time.  She's alert and oriented x3 and her insight judgment and impulse control is okay.  Assessment Axis I Maj. depressive disorder Axis II deferred Axis III see medical history Axis IV mild to moderate  Plan I discuss in length about her medication response.  We discussed the medication policy that she should not be taking any benzodiazepine from other physicians since he is been getting from this office.  She promised and agreed that she will not ask any more benzos from her primary care physician.  We also talk about taking too pain medication for her fibromyalgia.  She likes to continue trazodone which is helping her sleep along with Ativan.  I will start Lamictal 25 mg but slow to addition to 50 mg in one week to target her emotional symptoms.  At this time she does not want to  take Depakote.  I will discontinue the Depakote.  She will continue Cymbalta.  I recommend to call us back if she is any question or concern about the medication if he feel worsening of the symptoms.  Time spent 30 minutes.  I will see her again in 4 weeks.  Portion of this note is generated with voice recognition software and may contain typographical error.

## 2012-03-22 ENCOUNTER — Ambulatory Visit (HOSPITAL_COMMUNITY): Payer: Self-pay | Admitting: Psychiatry

## 2012-03-24 ENCOUNTER — Other Ambulatory Visit (HOSPITAL_COMMUNITY): Payer: Self-pay | Admitting: Psychiatry

## 2012-03-26 ENCOUNTER — Other Ambulatory Visit (HOSPITAL_COMMUNITY): Payer: Self-pay | Admitting: Psychiatry

## 2012-03-30 ENCOUNTER — Ambulatory Visit (HOSPITAL_COMMUNITY): Payer: Self-pay | Admitting: Psychology

## 2012-04-03 ENCOUNTER — Other Ambulatory Visit (HOSPITAL_COMMUNITY): Payer: Self-pay | Admitting: Psychiatry

## 2012-04-03 DIAGNOSIS — F329 Major depressive disorder, single episode, unspecified: Secondary | ICD-10-CM

## 2012-04-05 ENCOUNTER — Ambulatory Visit (HOSPITAL_COMMUNITY): Payer: Self-pay | Admitting: Psychiatry

## 2012-04-18 ENCOUNTER — Ambulatory Visit (INDEPENDENT_AMBULATORY_CARE_PROVIDER_SITE_OTHER): Payer: Medicare Other | Admitting: Psychology

## 2012-04-18 DIAGNOSIS — F419 Anxiety disorder, unspecified: Secondary | ICD-10-CM

## 2012-04-18 DIAGNOSIS — F411 Generalized anxiety disorder: Secondary | ICD-10-CM

## 2012-04-18 DIAGNOSIS — F39 Unspecified mood [affective] disorder: Secondary | ICD-10-CM

## 2012-04-19 ENCOUNTER — Encounter (HOSPITAL_COMMUNITY): Payer: Self-pay | Admitting: Psychology

## 2012-04-19 NOTE — Progress Notes (Signed)
Patient:  Lauren [name]   DOB: May 04, 1951  MR Number: [id number]  Location: BEHAVIORAL [facility] [address] [place] [address] Dept: [phone]  Start: 2 PM End: 3 PM  Provider/Observer:     Hershal Coria PSYD  Chief Complaint:      Chief Complaint  Patient presents with  . Anxiety  . Depression  . Stress  . Trauma    Reason For Service:     The patient has a long history of major depressive condition and ongoing underlying depressive disorder. She is a recovering alcoholic but has many many years this Friday. Significant symptoms of anxiety have played a major role in substance abuse in the past. Significant medical problems and her inability to work have complicated her situation. When all these difficulties are put in the context and relationship to potential workability it is clear that she is disabled not do not expect her to return to gainful employment.  Interventions Strategy:  Cognitive/behavioral psychotherapy  Participation Level:   Active  Participation Quality:  Appropriate      Behavioral Observation:  Well Groomed, Alert, and Appropriate.   Current Psychosocial Factors: The patient reports that she has been doing little bit better since her last visit but reports that she continues to feel overwhelmed with loss do to recent losses. The patient's husband's ex-wife died in 2023/03/21 and then more recently her sponsor died from illness. While these were neither shocking or surprising events she has taken it quite hard. It has really ignited some deep feelings since his of guilt and becomes a vehicle for her to look around and find self blame and criticism..  Content of Session:   Review current symptoms and continue to work on therapeutic interventions for improved coping skills and strategies.  Current Status:   The patient has been dealing with more symptoms of depression recently  and psychosocial stressors and deaths in her social circle have been quite stressful for her..  Patient Progress:   Stable and improving  Target Goals:   Reduce the frequency of depressive events including feelings of hopelessness and helplessness, anhedonia, social withdrawal, as well as significant anxiety symptoms related to social avoidance, fear of abandonment and fear of inability to manage and cope with her world. We will also look at continuing complete sobriety.  Last Reviewed:   04/18/2012  Goals Addressed Today:    Target goals addressed today have to do with issues of depression in particular feelings of helplessness and hopelessness.   Impression/Diagnosis:   The patient has a long history of depressive disorder and underlying anxiety disorder. She has had numerous medical issues including severe fibromyalgia and severe arthritis. She has been diagnosed with significant arthritis throughout her body and in particular her hands and her feet. Numerous events and left are overwhelmed and unable to work. The patient has had periodic times of improvement for primary symptoms but there are other times where the symptoms are so problematic that she has not been able to maintain gainful employment.  Diagnosis:    Axis I:  1. Mood disorder   2. Anxiety         Axis II: No diagnosis

## 2012-04-26 ENCOUNTER — Ambulatory Visit (HOSPITAL_COMMUNITY): Payer: Self-pay | Admitting: Psychiatry

## 2012-05-03 ENCOUNTER — Encounter (HOSPITAL_COMMUNITY): Payer: Self-pay | Admitting: Psychiatry

## 2012-05-03 ENCOUNTER — Ambulatory Visit (HOSPITAL_COMMUNITY): Payer: Self-pay | Admitting: Psychology

## 2012-05-03 ENCOUNTER — Ambulatory Visit (INDEPENDENT_AMBULATORY_CARE_PROVIDER_SITE_OTHER): Payer: Medicare Other | Admitting: Psychiatry

## 2012-05-03 VITALS — BP 116/80 | HR 105 | Wt 141.0 lb

## 2012-05-03 DIAGNOSIS — F329 Major depressive disorder, single episode, unspecified: Secondary | ICD-10-CM

## 2012-05-03 MED ORDER — TRAZODONE HCL 100 MG PO TABS: 100.0000 mg | ORAL_TABLET | Freq: Every day | ORAL | Status: DC

## 2012-05-03 MED ORDER — DULOXETINE HCL 60 MG PO CPEP: 60.0000 mg | ORAL_CAPSULE | Freq: Every day | ORAL | Status: DC

## 2012-05-03 NOTE — Progress Notes (Signed)
Chief complaint I DID not start Lamictal.  I was worried about the side effects.    History of presenting illness Patient is 61 year old Caucasian female who came for her followup appointment.  Patient came with depressed mood.  She was tearful crying and frustrated.  Patient told me she continues to have poor sleep and depressive thoughts.  She did not start the mental and she was worried about the side effects.  She admitted reading side effects made her very scared .  She endorse sometimes feeling tired taking psychiatric medication.  She continued to endorse pain .  She is taking Ativan 1 mg 2 tablet at bedtime.  Now her Ativan as prescribed by her primary care physician.  She continued to endorse some time paranoia and trusting other people.  She admitted some time she had manic-like symptoms but she is a scared to believe that she has bipolar disorder.  She is taking Cymbalta and trazodone as prescribed.  She has not abused her Ativan in recent days.  She's not drinking or using any illegal substance.  She continues to have fibromyalgia pain.     Current psychiatric medication Cymbalta 60 mg daily Trazodone 100 mg at bedtime Ativan 1 mg 2 tablet at bedtime prescribed by primary care physician  Past psychiatric history Patient has a long history of depression. She has history of inpatient psychiatry treatment. She was last admitted in 2010 at old [facility] due to change mental status. She had tried multiple medication in the past including Lexapro Wellbutrin however she usually respond well on Cymbalta.  Medical history Patient has history of fibromyalgia, osteoarthritis, IBS, and GERD and chronic pain  Mental status examination Patient is fairly groomed and well dressed. She is anxious emotional but cooperative.  Her speech is fast at times rambling and pressured.  Her thought process is also fast but logical.  She described her mood is anxious and her affect is mood congruent.  She  denies any active or passive suicidal thoughts or homicidal thoughts.  She endorse some paranoia that people are talking about her but she denies any hallucination. Her attention and concentration is distracted at times.  There were no obsession present at this time.  She's alert and oriented x3 and her insight judgment and impulse control is okay.  Assessment Axis I Maj. depressive disorder Axis II deferred Axis III see medical history Axis IV mild to moderate  Plan 1 more time I discuss her symptoms , prognosis and need of mood stabilizer.  I encouraged her to try Lamictal but she has not started.  Patient agreed with the plan.  She can always that she has to take some medication for her anger mood sleep and depression.  She'll start Lamictal as prescribed.  I will continue her Cymbalta and trazodone .  She will see therapist for coping and social skills.  I recommend to call us if she has any question or concern about the medication.  She is still taking Ativan prescribed by primary care physician.  Time spent 30 minutes.  I will see her in 3-4 weeks.  Portion of this note is generated with voice recognition software and may contain typographical error.

## 2012-05-10 ENCOUNTER — Ambulatory Visit (INDEPENDENT_AMBULATORY_CARE_PROVIDER_SITE_OTHER): Payer: Medicare Other | Admitting: Psychology

## 2012-05-10 DIAGNOSIS — F411 Generalized anxiety disorder: Secondary | ICD-10-CM

## 2012-05-10 DIAGNOSIS — F39 Unspecified mood [affective] disorder: Secondary | ICD-10-CM

## 2012-05-10 DIAGNOSIS — F419 Anxiety disorder, unspecified: Secondary | ICD-10-CM

## 2012-05-11 ENCOUNTER — Encounter (HOSPITAL_COMMUNITY): Payer: Self-pay | Admitting: Psychology

## 2012-05-11 NOTE — Progress Notes (Signed)
Patient:  Lauren [name]   DOB: 02-02-1951  MR Number: [id number]  Location: BEHAVIORAL [facility] [address] [place] [address] Dept: [phone]  Start: 2 PM End: 3 PM  Provider/Observer:     Hershal Coria PSYD  Chief Complaint:      Chief Complaint  Patient presents with  . Anxiety  . Agitation  . Depression    Reason For Service:     The patient has a long history of major depressive condition and ongoing underlying depressive disorder. She is a recovering alcoholic but has many many years this Friday. Significant symptoms of anxiety have played a major role in substance abuse in the past. Significant medical problems and her inability to work have complicated her situation. When all these difficulties are put in the context and relationship to potential workability it is clear that she is disabled not do not expect her to return to gainful employment.  Interventions Strategy:  Cognitive/behavioral psychotherapy  Participation Level:   Active  Participation Quality:  Appropriate      Behavioral Observation:  Well Groomed, Alert, and Appropriate.   Current Psychosocial Factors: The patient reports that she has been doing better in that she and her husband have been having positive interactions lately and dealing with her anxiety and pervasive feelings of guilt. However, the patient has begun ruminating more about the years of cardiovascular issues after her brother was diagnosed with a significant cardiovascular issue following what happened to her father and her other brothers issues. However, she has been assessed for these issues and she has not showed any sign of that..  Content of Session:   Review current symptoms and continue to work on therapeutic interventions for improved coping skills and strategies.  Current Status:   The patient has been dealing with more symptoms of depression  recently and psychosocial stressors and deaths in her social circle have been quite stressful for her..  Patient Progress:   Stable and improving  Target Goals:   Reduce the frequency of depressive events including feelings of hopelessness and helplessness, anhedonia, social withdrawal, as well as significant anxiety symptoms related to social avoidance, fear of abandonment and fear of inability to manage and cope with her world. We will also look at continuing complete sobriety.  Last Reviewed:   05/10/2012  Goals Addressed Today:    Target goals addressed today have to do with issues of depression in particular feelings of helplessness and hopelessness.   Impression/Diagnosis:   The patient has a long history of depressive disorder and underlying anxiety disorder. She has had numerous medical issues including severe fibromyalgia and severe arthritis. She has been diagnosed with significant arthritis throughout her body and in particular her hands and her feet. Numerous events and left are overwhelmed and unable to work. The patient has had periodic times of improvement for primary symptoms but there are other times where the symptoms are so problematic that she has not been able to maintain gainful employment.  Diagnosis:    Axis I:  1. Mood disorder   2. Anxiety         Axis II: No diagnosis

## 2012-05-31 ENCOUNTER — Ambulatory Visit (INDEPENDENT_AMBULATORY_CARE_PROVIDER_SITE_OTHER): Payer: Medicare Other | Admitting: Psychiatry

## 2012-05-31 ENCOUNTER — Encounter (HOSPITAL_COMMUNITY): Payer: Self-pay | Admitting: Psychiatry

## 2012-05-31 DIAGNOSIS — F329 Major depressive disorder, single episode, unspecified: Secondary | ICD-10-CM

## 2012-05-31 MED ORDER — LAMOTRIGINE 25 MG PO TABS: ORAL_TABLET | ORAL | Status: DC

## 2012-05-31 NOTE — Progress Notes (Signed)
Chief complaint I am taking Lamictal and I like this medication.      History of presenting illness Patient is 61 year old Caucasian female who came for her followup appointment.  Patient decided take Lamictal and she is taking 50 mg every day.  She denies any side effects including any rash or itching.  Recently she had upper respiratory infection and recently finished antibiotic.  Initially she has some itching however it resolved since she stop taking antibiotic.  She's physically doing better.  She continues to have some anxiety and depression but overall her crying spells or less intense and less frequent.  She still has some paranoia that people are talking about her but she denies any agitation anger mood swing.  She sleeping better.  She continued to endorse financial distress and sometime she gets overwhelmed .  She seeing therapist regularly for coping and social skills.  She likes her Cymbalta .  She does not require trazodone in recent weeks.  She has chronic fibromyalgia pain .  She's not drinking or using any illegal substance.  She still requires Ativan 1-2 tablet at bedtime.  She does not ask for early refills of her benzodiazepine.    Current psychiatric medication Cymbalta 60 mg daily Lamictal 50 mg daily Ativan 1 mg 2 tablet at bedtime prescribed by primary care physician  Past psychiatric history Patient has a long history of depression. She has history of inpatient psychiatry treatment. She was last admitted in 2010 at old [facility] due to change mental status. She had tried multiple medication in the past including Lexapro Wellbutrin however she usually respond well on Cymbalta.  Medical history Patient has history of fibromyalgia, osteoarthritis, IBS, and GERD and chronic pain  Mental status examination Patient is fairly groomed and well dressed. She is emotional but cooperative.  Her speech is fast and pressured.  Her thought process is logical and goal-directed.   She described her mood is better and her affect is improved.  She denies any active or passive suicidal thoughts or homicidal thoughts.  She still has some paranoia about people but she denies any hallucination. Her attention and concentration is fair. There were no obsession present at this time.  She's alert and oriented x3 and her insight judgment and impulse control is okay.  Assessment Axis I Maj. depressive disorder Axis II deferred Axis III see medical history Axis IV mild to moderate  Plan I encourage her to continue Lamictal since it is helping her mood and agitation.  I recommend to try Lamictal 75 mg daily.  I explained risks and benefits of medication especially rash in that case she need to stop the medication immediately.  She will see therapist for coping and social skills.  I will continue her Cymbalta and Ativan.  I also explained that there is a possibility she may see a new psychiatrist on her next appointment since I'm going to [place] office for full-time.  I recommend to call us if she is any question or concern about the medication if she feels worsening of the symptom.  Followup in 6 weeks. Time spent 30 minutes.  Portion of this note is generated with voice recognition software and may contain typographical error.

## 2012-06-07 ENCOUNTER — Ambulatory Visit (INDEPENDENT_AMBULATORY_CARE_PROVIDER_SITE_OTHER): Payer: Medicare Other | Admitting: Psychology

## 2012-06-07 DIAGNOSIS — F411 Generalized anxiety disorder: Secondary | ICD-10-CM

## 2012-06-07 DIAGNOSIS — F419 Anxiety disorder, unspecified: Secondary | ICD-10-CM

## 2012-06-07 DIAGNOSIS — F39 Unspecified mood [affective] disorder: Secondary | ICD-10-CM

## 2012-06-08 ENCOUNTER — Encounter (HOSPITAL_COMMUNITY): Payer: Self-pay | Admitting: Psychology

## 2012-06-08 NOTE — Progress Notes (Signed)
Patient:  Lauren [name]   DOB: 10-Feb-1951  MR Number: [id number]  Location: BEHAVIORAL [facility] [address] [place] [address] Dept: [phone]  Start: 4 PM End: 5 PM  Provider/Observer:     Hershal Coria PSYD  Chief Complaint:      Chief Complaint  Patient presents with  . Anxiety  . Agitation    Reason For Service:     The patient has a long history of major depressive condition and ongoing underlying depressive disorder. She is a recovering alcoholic but has many many years this Friday. Significant symptoms of anxiety have played a major role in substance abuse in the past. Significant medical problems and her inability to work have complicated her situation. When all these difficulties are put in the context and relationship to potential workability it is clear that she is disabled not do not expect her to return to gainful employment.  Interventions Strategy:  Cognitive/behavioral psychotherapy  Participation Level:   Active  Participation Quality:  Appropriate      Behavioral Observation:  Well Groomed, Alert, and Appropriate.   Current Psychosocial Factors: The patient reports that she's been having a lot of guilt around the use of her  Lorazepam and how issues such as benzodiazepine uses dealt with with in AA and NA. The patient is sponsoring someone in AA that use to use significant amounts of Xanax and abused them. However, they have reduce this individual Xanax use down to 0.25 mg 3 times a day which is a generally low dose. However, some of the people in AA are attacking her for this and attacking the patient for not making every effort to stop it or to get this individual out of the group.   Content of Session:   Review current symptoms and continue to work on therapeutic interventions for improved coping skills and strategies.  Current Status:   The patient has been dealing with  more symptoms of depression recently and psychosocial stressors and deaths in her social circle have been quite stressful for her..  Patient Progress:   Stable and improving  Target Goals:   Reduce the frequency of depressive events including feelings of hopelessness and helplessness, anhedonia, social withdrawal, as well as significant anxiety symptoms related to social avoidance, fear of abandonment and fear of inability to manage and cope with her world. We will also look at continuing complete sobriety.  Last Reviewed:   06/08/2012  Goals Addressed Today:    Target goals addressed today have to do with issues of depression in particular feelings of helplessness and hopelessness.   Impression/Diagnosis:   The patient has a long history of depressive disorder and underlying anxiety disorder. She has had numerous medical issues including severe fibromyalgia and severe arthritis. She has been diagnosed with significant arthritis throughout her body and in particular her hands and her feet. Numerous events and left are overwhelmed and unable to work. The patient has had periodic times of improvement for primary symptoms but there are other times where the symptoms are so problematic that she has not been able to maintain gainful employment.  Diagnosis:    Axis I:  1. Mood disorder   2. Anxiety         Axis II: No diagnosis

## 2012-06-27 ENCOUNTER — Other Ambulatory Visit (HOSPITAL_COMMUNITY): Payer: Self-pay | Admitting: Psychiatry

## 2012-06-28 ENCOUNTER — Other Ambulatory Visit (HOSPITAL_COMMUNITY): Payer: Self-pay | Admitting: Psychiatry

## 2012-06-28 DIAGNOSIS — F329 Major depressive disorder, single episode, unspecified: Secondary | ICD-10-CM

## 2012-06-28 MED ORDER — LAMOTRIGINE 25 MG PO TABS: ORAL_TABLET | ORAL | Status: DC

## 2012-07-06 ENCOUNTER — Ambulatory Visit (INDEPENDENT_AMBULATORY_CARE_PROVIDER_SITE_OTHER): Payer: Medicare Other | Admitting: Psychology

## 2012-07-06 DIAGNOSIS — F411 Generalized anxiety disorder: Secondary | ICD-10-CM

## 2012-07-06 DIAGNOSIS — F39 Unspecified mood [affective] disorder: Secondary | ICD-10-CM

## 2012-07-06 DIAGNOSIS — F419 Anxiety disorder, unspecified: Secondary | ICD-10-CM

## 2012-07-10 ENCOUNTER — Encounter (HOSPITAL_COMMUNITY): Payer: Self-pay | Admitting: Psychology

## 2012-07-10 NOTE — Progress Notes (Signed)
Patient:  Lauren [name]   DOB: 01-26-1951  MR Number: [id number]  Location: BEHAVIORAL [facility] [address] [place] [address] Dept: [phone]  Start: 4 PM End: 5 PM  Provider/Observer:     Hershal Coria PSYD  Chief Complaint:      Chief Complaint  Patient presents with  . Depression  . Anxiety  . Agitation  . Stress    Reason For Service:     The patient has a long history of major depressive condition and ongoing underlying depressive disorder. She is a recovering alcoholic but has many many years this Friday. Significant symptoms of anxiety have played a major role in substance abuse in the past. Significant medical problems and her inability to work have complicated her situation. When all these difficulties are put in the context and relationship to potential workability it is clear that she is disabled not do not expect her to return to gainful employment.  Interventions Strategy:  Cognitive/behavioral psychotherapy  Participation Level:   Active  Participation Quality:  Appropriate      Behavioral Observation:  Well Groomed, Alert, and Appropriate.   Current Psychosocial Factors: The patient reports that she has been doing much better with the current medication changes including Lamictal. The patient reports that she was quite apprehensive about this medication initially but feels like it is greatly helped her mood stability and while is has been difficult for her to except the realization that she has a mood disorder rather than simply depression she is looking at this situation a much better way..   Content of Session:   Review current symptoms and continue to work on therapeutic interventions for improved coping skills and strategies.  Current Status:   The patient appeared better today than I seen her in quite some time. While she continued to be rather manic in her speech it  was better than it has been recently. Her mood was much more stable.  Patient Progress:   Stable and improving  Target Goals:   Reduce the frequency of depressive events including feelings of hopelessness and helplessness, anhedonia, social withdrawal, as well as significant anxiety symptoms related to social avoidance, fear of abandonment and fear of inability to manage and cope with her world. We will also look at continuing complete sobriety.  Last Reviewed:   07/06/2012  Goals Addressed Today:    Target goals addressed today have to do with issues of depression in particular feelings of helplessness and hopelessness.   Impression/Diagnosis:   The patient has a long history of depressive disorder and underlying anxiety disorder. She has had numerous medical issues including severe fibromyalgia and severe arthritis. She has been diagnosed with significant arthritis throughout her body and in particular her hands and her feet. Numerous events and left are overwhelmed and unable to work. The patient has had periodic times of improvement for primary symptoms but there are other times where the symptoms are so problematic that she has not been able to maintain gainful employment.  Diagnosis:    Axis I:  1. Mood disorder   2. Anxiety         Axis II: No diagnosis

## 2012-07-12 ENCOUNTER — Ambulatory Visit (HOSPITAL_COMMUNITY): Payer: Self-pay | Admitting: Psychiatry

## 2012-07-18 ENCOUNTER — Ambulatory Visit (HOSPITAL_COMMUNITY): Payer: Self-pay | Admitting: Psychiatry

## 2012-07-25 ENCOUNTER — Ambulatory Visit (INDEPENDENT_AMBULATORY_CARE_PROVIDER_SITE_OTHER): Payer: Medicare Other | Admitting: Psychiatry

## 2012-07-25 ENCOUNTER — Encounter (HOSPITAL_COMMUNITY): Payer: Self-pay | Admitting: Psychiatry

## 2012-07-25 VITALS — BP 136/78 | HR 108 | Ht 64.5 in | Wt 149.2 lb

## 2012-07-25 DIAGNOSIS — R52 Pain, unspecified: Secondary | ICD-10-CM

## 2012-07-25 DIAGNOSIS — F489 Nonpsychotic mental disorder, unspecified: Secondary | ICD-10-CM

## 2012-07-25 DIAGNOSIS — F5105 Insomnia due to other mental disorder: Secondary | ICD-10-CM | POA: Insufficient documentation

## 2012-07-25 DIAGNOSIS — F329 Major depressive disorder, single episode, unspecified: Secondary | ICD-10-CM

## 2012-07-25 MED ORDER — MELOXICAM 7.5 MG PO TABS: 7.5000 mg | ORAL_TABLET | Freq: Every day | ORAL | Status: DC

## 2012-07-25 MED ORDER — AMITRIPTYLINE HCL 25 MG PO TABS: 25.0000 mg | ORAL_TABLET | Freq: Every day | ORAL | Status: DC

## 2012-07-25 MED ORDER — LAMOTRIGINE 25 MG PO TABS: ORAL_TABLET | ORAL | Status: DC

## 2012-07-25 MED ORDER — DULOXETINE HCL 60 MG PO CPEP: 60.0000 mg | ORAL_CAPSULE | Freq: Every day | ORAL | Status: DC

## 2012-07-25 MED ORDER — OMEPRAZOLE 20 MG PO CPDR: 20.0000 mg | DELAYED_RELEASE_CAPSULE | Freq: Every day | ORAL | Status: DC

## 2012-07-25 MED ORDER — GABAPENTIN 100 MG PO CAPS: 100.0000 mg | ORAL_CAPSULE | Freq: Three times a day (TID) | ORAL | Status: DC

## 2012-07-25 NOTE — Progress Notes (Signed)
Chief complaint The Lamictal is not helping.  It makes me nauseated and sleepy.     History of presenting illness Patient is 61 year old Caucasian female who came for her followup appointment.  Pt has pain issues, is an alcoholic, on opiates and benzos.  She feels that she does not have bipolar disorder, just has a chemical effect on her brain from the opiates and benzos.  Will stop both and prescribe pain meds and Neurontin for her anxiety.  Keep her on the Cymbalta for pain, depression, and anxiety.  Will taper off the Lamictal.  Stop Ativan and Hydrocodone.   Current psychiatric medication Cymbalta 60 mg daily Lamictal 50 mg daily Ativan 1 mg 2 tablet at bedtime prescribed by primary care physician  Past psychiatric history Patient has a long history of depression. She has history of inpatient psychiatry treatment. She was last admitted in 2010 at old [facility] due to change mental status. She had tried multiple medication in the past including Lexapro Wellbutrin however she usually respond well on Cymbalta.  Medical history Patient has history of fibromyalgia, osteoarthritis, IBS, and GERD and chronic pain  Mental status examination Patient is fairly groomed and well dressed. She is emotional but cooperative.  Her speech is fast and pressured.  Her thought process is logical and goal-directed.  She described her mood is better and her affect is improved.  She denies any active or passive suicidal thoughts or homicidal thoughts.  She still has some paranoia about people but she denies any hallucination. Her attention and concentration is fair. There were no obsession present at this time.  She's alert and oriented x3 and her insight judgment and impulse control is okay. Tearful about no body listening to her about her chemical effect on her brain.   Assessment Axis I Maj. depressive disorder, insomnia, pain issues, HIstory of alcoholism. Axis II deferred Axis III see medical  history Axis IV mild to moderate  Plan I reviewed CC, tobacco/med/surg Hx, meds effects/ side effects, problem list, therapies and responses as well as her situation/symptoms as well as options.  See above and pt instructions.

## 2012-07-25 NOTE — Patient Instructions (Addendum)
Stop opiates and benzodiazepines.   Use everything that is non narcotic for pain management. Yoga, meditation 15  Three times a day, bubble baths. Behold beauty on all walks  And find the beauty in the struggles. "Thank God for the seemingly bad, for that is where my lessons come from".  Strongly consider attending at least 6 Alanon Meetings to help you learn about how your helping others to the exclusion of helping yourself is actually hurting yourself and is actually an addiction to fixing others and that you need to work the 12 Step to Happiness through the [employer]. Al-Anon Family Groups could be helpful with how to deal with substance abusing family and friends. Or your own issues of being in victim role.  There are only 40 Alanon Family Group meetings a week here in [place].  Online are current listing of those meetings @ [email or web address].[email or web address]/meetings.[email or web address]  There are [employer].  Search on line and there you can learn the format and can access the schedule for yourself.  Their number is [phone]  "I am Wishes Fulfilled Meditation" by Marylene Buerger and Lyndal Pulley may be helpful for meditation  Could use "Move Free" or "Osteo bi Flex" for arthritic pain.   The important ingredients are Chondrotin Sulfate and Glucosamine.  "Native Wisdom for [employer] by Camelia Phenes could be very helpful.  "The [place] to [place]" compiled by [employer] could be helpful.

## 2012-07-27 ENCOUNTER — Ambulatory Visit (INDEPENDENT_AMBULATORY_CARE_PROVIDER_SITE_OTHER): Payer: Medicare Other | Admitting: Psychology

## 2012-07-27 ENCOUNTER — Other Ambulatory Visit (HOSPITAL_COMMUNITY): Payer: Self-pay | Admitting: *Deleted

## 2012-07-27 DIAGNOSIS — F419 Anxiety disorder, unspecified: Secondary | ICD-10-CM

## 2012-07-27 DIAGNOSIS — F5105 Insomnia due to other mental disorder: Secondary | ICD-10-CM

## 2012-07-27 DIAGNOSIS — R52 Pain, unspecified: Secondary | ICD-10-CM

## 2012-07-27 DIAGNOSIS — F39 Unspecified mood [affective] disorder: Secondary | ICD-10-CM

## 2012-07-27 DIAGNOSIS — F411 Generalized anxiety disorder: Secondary | ICD-10-CM

## 2012-07-27 MED ORDER — GABAPENTIN 100 MG PO CAPS: 100.0000 mg | ORAL_CAPSULE | Freq: Three times a day (TID) | ORAL | Status: DC

## 2012-07-30 ENCOUNTER — Telehealth (HOSPITAL_COMMUNITY): Payer: Self-pay | Admitting: *Deleted

## 2012-08-06 ENCOUNTER — Ambulatory Visit (INDEPENDENT_AMBULATORY_CARE_PROVIDER_SITE_OTHER): Payer: Medicare Other | Admitting: Psychiatry

## 2012-08-06 ENCOUNTER — Encounter (HOSPITAL_COMMUNITY): Payer: Self-pay | Admitting: Psychiatry

## 2012-08-06 DIAGNOSIS — R52 Pain, unspecified: Secondary | ICD-10-CM

## 2012-08-06 DIAGNOSIS — F489 Nonpsychotic mental disorder, unspecified: Secondary | ICD-10-CM

## 2012-08-06 DIAGNOSIS — F5105 Insomnia due to other mental disorder: Secondary | ICD-10-CM

## 2012-08-06 DIAGNOSIS — F329 Major depressive disorder, single episode, unspecified: Secondary | ICD-10-CM

## 2012-08-06 DIAGNOSIS — F32A Depression, unspecified: Secondary | ICD-10-CM

## 2012-08-06 MED ORDER — AMITRIPTYLINE HCL 10 MG PO TABS: 10.0000 mg | ORAL_TABLET | Freq: Every day | ORAL | Status: DC

## 2012-08-06 NOTE — Patient Instructions (Addendum)
Keep up the meditation practice and walking and communing with nature daily.  Cut back on sugar and carbohydrates, that means very limited fruits and starchy vegetables and very limited grains, breads  The goal is low GLYCEMIC INDEX.  Eat avocados, eggs, lean meat like grass fed beef and chicken  Try opening up the Neurontin capsule and pour out a small portion of it and take the smaller dose.

## 2012-08-06 NOTE — Progress Notes (Signed)
Chief complaint I can't take that much Neurontin.   History of presenting illness Patient is 61 year old Caucasian female who came for her followup appointment.  Her pain issues are considerably better, but she is sleeping too much.  Will cut back on the Neurontin dose.   Current psychiatric medication Cymbalta 60 mg daily Neurontin 100 mg 3 a day, but sleeping too much. Tried Elavil, but had bad dreams with that. Ativan 1 mg 1 tablet at bedtime prescribed by primary care physician  Past psychiatric history Patient has a long history of depression. She has history of inpatient psychiatry treatment. She was last admitted in 2010 at old [facility] due to change mental status. She had tried multiple medication in the past including Lexapro Wellbutrin however she usually respond well on Cymbalta.  Medical history Patient has history of fibromyalgia, osteoarthritis, IBS, and GERD and chronic pain  Mental status examination Patient is fairly groomed and well dressed. She is emotional but cooperative.  Her speech is fast and pressured.  Her thought process is logical and goal-directed.  She described her mood is better and her affect is improved.  She denies any active or passive suicidal thoughts or homicidal thoughts.  She still has some paranoia about people but she denies any hallucination. Her attention and concentration is fair. There were no obsession present at this time.  She's alert and oriented x3 and her insight judgment and impulse control is okay. Tearful about no body listening to her about her chemical effect on her brain.   Assessment Axis I Maj. depressive disorder, insomnia, pain issues, HIstory of alcoholism. Axis II deferred Axis III see medical history Axis IV mild to moderate  Plan I reviewed CC, tobacco/med/surg Hx, meds effects/ side effects, problem list, therapies and responses as well as her situation/symptoms as well as options.   See orders and pt  instructions for more details.

## 2012-08-14 ENCOUNTER — Encounter (HOSPITAL_COMMUNITY): Payer: Self-pay | Admitting: Psychology

## 2012-08-14 NOTE — Progress Notes (Signed)
Patient:  Lauren [name]   DOB: November 18, 1950  MR Number: [id number]  Location: BEHAVIORAL [facility] [address] [place] [address] Dept: [phone]  Start: 3 PM End: 4 PM  Provider/Observer:     Hershal Coria PSYD  Chief Complaint:      Chief Complaint  Patient presents with  . Agitation  . Anxiety  . Depression    Reason For Service:     The patient has a long history of major depressive condition and ongoing underlying depressive disorder. She is a recovering alcoholic but has many many years this Friday. Significant symptoms of anxiety have played a major role in substance abuse in the past. Significant medical problems and her inability to work have complicated her situation. When all these difficulties are put in the context and relationship to potential workability it is clear that she is disabled not do not expect her to return to gainful employment.  Interventions Strategy:  Cognitive/behavioral psychotherapy  Participation Level:   Active  Participation Quality:  Appropriate      Behavioral Observation:  Well Groomed, Alert, and Appropriate.   Current Psychosocial Factors: The patient is actually doing much better today and reports that she and her husband have been communicating better and that some of the initial stressors have been improving.   Content of Session:   Review current symptoms and continue to work on therapeutic interventions for improved coping skills and strategies.  Current Status:   The patient appeared better today than I seen her in quite some time. While she continued to be rather manic in her speech it was better than it has been recently. Her mood was much more stable.  Patient Progress:   Stable and improving  Target Goals:   Reduce the frequency of depressive events including feelings of hopelessness and helplessness, anhedonia, social withdrawal, as well  as significant anxiety symptoms related to social avoidance, fear of abandonment and fear of inability to manage and cope with her world. We will also look at continuing complete sobriety.  Last Reviewed:   07/27/2012  Goals Addressed Today:    Target goals addressed today have to do with issues of depression in particular feelings of helplessness and hopelessness.   Impression/Diagnosis:   The patient has a long history of depressive disorder and underlying anxiety disorder. She has had numerous medical issues including severe fibromyalgia and severe arthritis. She has been diagnosed with significant arthritis throughout her body and in particular her hands and her feet. Numerous events and left are overwhelmed and unable to work. The patient has had periodic times of improvement for primary symptoms but there are other times where the symptoms are so problematic that she has not been able to maintain gainful employment.  Diagnosis:    Axis I:  1. Mood disorder   2. Anxiety         Axis II: No diagnosis

## 2012-08-16 ENCOUNTER — Telehealth (HOSPITAL_COMMUNITY): Payer: Self-pay | Admitting: Psychiatry

## 2012-08-16 NOTE — Telephone Encounter (Signed)
Phone message completed in the phone message section.  

## 2012-08-17 ENCOUNTER — Ambulatory Visit (INDEPENDENT_AMBULATORY_CARE_PROVIDER_SITE_OTHER): Payer: Medicare Other | Admitting: Psychology

## 2012-08-17 ENCOUNTER — Telehealth (HOSPITAL_COMMUNITY): Payer: Self-pay | Admitting: Psychiatry

## 2012-08-17 DIAGNOSIS — F411 Generalized anxiety disorder: Secondary | ICD-10-CM

## 2012-08-17 DIAGNOSIS — F32A Depression, unspecified: Secondary | ICD-10-CM

## 2012-08-17 DIAGNOSIS — F329 Major depressive disorder, single episode, unspecified: Secondary | ICD-10-CM

## 2012-08-17 DIAGNOSIS — F39 Unspecified mood [affective] disorder: Secondary | ICD-10-CM

## 2012-08-17 DIAGNOSIS — F419 Anxiety disorder, unspecified: Secondary | ICD-10-CM

## 2012-08-17 MED ORDER — LAMOTRIGINE 25 MG PO TABS: 75.0000 mg | ORAL_TABLET | Freq: Every day | ORAL | Status: DC

## 2012-08-17 NOTE — Telephone Encounter (Signed)
Lamictal reordered per pt request.

## 2012-08-20 ENCOUNTER — Encounter (HOSPITAL_COMMUNITY): Payer: Self-pay | Admitting: Psychology

## 2012-08-20 NOTE — Progress Notes (Signed)
Patient:  Lauren [name]   DOB: November 02, 1950  MR Number: [id number]  Location: BEHAVIORAL [facility] [address] [place] [address] Dept: [phone]  Start: 1 PM End: 2 PM  Provider/Observer:     Hershal Coria PSYD  Chief Complaint:      Chief Complaint  Patient presents with  . Anxiety  . Depression  . Agitation  . Stress    Reason For Service:     The patient has a long history of major depressive condition and ongoing underlying depressive disorder. She is a recovering alcoholic but has many many years this Friday. Significant symptoms of anxiety have played a major role in substance abuse in the past. Significant medical problems and her inability to work have complicated her situation. When all these difficulties are put in the context and relationship to potential workability it is clear that she is disabled not do not expect her to return to gainful employment.  Interventions Strategy:  Cognitive/behavioral psychotherapy  Participation Level:   Active  Participation Quality:  Appropriate      Behavioral Observation:  Well Groomed, Alert, and Appropriate.   Current Psychosocial Factors: The patient is actually doing much better today and reports that she and her husband have been communicating better and that some of the initial stressors have been improving.   Content of Session:   Review current symptoms and continue to work on therapeutic interventions for improved coping skills and strategies.  Current Status:   The patient reports that she got into a hypomanic state recently and became increasingly agitated. The patient has a history of these episodes going back quite some time. She is trying to say some of her medications and has been really trying to reduce both the narcotic pain medications as well as the benzodiazepine. She is cut herself down completely with the narcotic pain  medications and only takes one every few days or so. The patient has been down to a half of a Klonopin on most days. She still having trouble with the benzodiazepine.  Patient Progress:   Stable and improving  Target Goals:   Reduce the frequency of depressive events including feelings of hopelessness and helplessness, anhedonia, social withdrawal, as well as significant anxiety symptoms related to social avoidance, fear of abandonment and fear of inability to manage and cope with her world. We will also look at continuing complete sobriety.  Last Reviewed:   08/17/2012  Goals Addressed Today:    Target goals addressed today have to do with issues of depression in particular feelings of helplessness and hopelessness.   Impression/Diagnosis:   The patient has a long history of depressive disorder and underlying anxiety disorder. She has had numerous medical issues including severe fibromyalgia and severe arthritis. She has been diagnosed with significant arthritis throughout her body and in particular her hands and her feet. Numerous events and left are overwhelmed and unable to work. The patient has had periodic times of improvement for primary symptoms but there are other times where the symptoms are so problematic that she has not been able to maintain gainful employment.  Diagnosis:    Axis I:  1. Mood disorder   2. Anxiety         Axis II: No diagnosis

## 2012-08-21 ENCOUNTER — Telehealth (HOSPITAL_COMMUNITY): Payer: Self-pay | Admitting: Psychiatry

## 2012-08-21 DIAGNOSIS — F329 Major depressive disorder, single episode, unspecified: Secondary | ICD-10-CM

## 2012-08-21 DIAGNOSIS — F32A Depression, unspecified: Secondary | ICD-10-CM

## 2012-08-21 MED ORDER — LAMOTRIGINE 25 MG PO TABS: 75.0000 mg | ORAL_TABLET | Freq: Every day | ORAL | Status: DC

## 2012-08-21 NOTE — Telephone Encounter (Signed)
Script requested by fax resent with note attached.

## 2012-08-24 ENCOUNTER — Ambulatory Visit (HOSPITAL_COMMUNITY): Payer: Self-pay | Admitting: Psychiatry

## 2012-09-12 ENCOUNTER — Ambulatory Visit (HOSPITAL_COMMUNITY): Payer: Self-pay | Admitting: Psychology

## 2012-09-14 ENCOUNTER — Ambulatory Visit (INDEPENDENT_AMBULATORY_CARE_PROVIDER_SITE_OTHER): Payer: Medicare Other | Admitting: Psychology

## 2012-09-14 DIAGNOSIS — F411 Generalized anxiety disorder: Secondary | ICD-10-CM

## 2012-09-14 DIAGNOSIS — F419 Anxiety disorder, unspecified: Secondary | ICD-10-CM

## 2012-09-14 DIAGNOSIS — F39 Unspecified mood [affective] disorder: Secondary | ICD-10-CM

## 2012-09-17 ENCOUNTER — Encounter (HOSPITAL_COMMUNITY): Payer: Self-pay | Admitting: Psychology

## 2012-09-17 NOTE — Progress Notes (Signed)
Patient:  Lauren [name]   DOB: 1951-08-30  MR Number: [id number]  Location: BEHAVIORAL [facility] [address] [place] [address] Dept: [phone]  Start: 3 PM End: 4 PM  Provider/Observer:     Hershal Coria PSYD  Chief Complaint:      Chief Complaint  Patient presents with  . Anxiety  . Agitation  . Stress    Reason For Service:     The patient has a long history of major depressive condition and ongoing underlying depressive disorder. She is a recovering alcoholic but has many many years this Friday. Significant symptoms of anxiety have played a major role in substance abuse in the past. Significant medical problems and her inability to work have complicated her situation. When all these difficulties are put in the context and relationship to potential workability it is clear that she is disabled not do not expect her to return to gainful employment.  Interventions Strategy:  Cognitive/behavioral psychotherapy  Participation Level:   Active  Participation Quality:  Appropriate      Behavioral Observation:  Well Groomed, Alert, and Appropriate.   Current Psychosocial Factors: The patient reports that she is continuing to experience a great deal of anxiety around the repeated attempts within the banking institution to 4 close on their house but did not actually fall". It does sound like a Bank is repeatedly delaying the foreclosure to avoid the responsible for taxes and other issues. They're climbing there have and maintaining, his insurance him and the patient will be responsible for that. However, the patient and her husband are both turned in the [place] for the house and are not planning on making the payments.   Content of Session:   Review current symptoms and continue to work on therapeutic interventions for improved coping skills and strategies.  Current Status:   The patient reports that  she's had great difficulty completely stopping any benzodiazepine. She reports that she has gone for extended periods of time without these that in great distress. She reports that the longest duration is been 3 full days without any benzodiazepine.  Patient Progress:   Stable and improving  Target Goals:   Reduce the frequency of depressive events including feelings of hopelessness and helplessness, anhedonia, social withdrawal, as well as significant anxiety symptoms related to social avoidance, fear of abandonment and fear of inability to manage and cope with her world. We will also look at continuing complete sobriety.  Last Reviewed:   09/14/2012   Goals Addressed Today:    Target goals addressed today have to do with issues of depression in particular feelings of helplessness and hopelessness.   Impression/Diagnosis:   The patient has a long history of depressive disorder and underlying anxiety disorder. She has had numerous medical issues including severe fibromyalgia and severe arthritis. She has been diagnosed with significant arthritis throughout her body and in particular her hands and her feet. Numerous events and left are overwhelmed and unable to work. The patient has had periodic times of improvement for primary symptoms but there are other times where the symptoms are so problematic that she has not been able to maintain gainful employment.  Diagnosis:    Axis I:  1. Mood disorder   2. Anxiety         Axis II: No diagnosis

## 2012-09-24 ENCOUNTER — Telehealth (HOSPITAL_COMMUNITY): Payer: Self-pay | Admitting: Psychiatry

## 2012-09-24 DIAGNOSIS — F5105 Insomnia due to other mental disorder: Secondary | ICD-10-CM

## 2012-09-24 MED ORDER — TRAZODONE HCL 100 MG PO TABS: 100.0000 mg | ORAL_TABLET | Freq: Every day | ORAL | Status: DC

## 2012-09-24 NOTE — Telephone Encounter (Signed)
Refill request approved via eScripts.  

## 2012-09-25 NOTE — Telephone Encounter (Signed)
Refill request printed out and available for pt to pick up and mail to mail order pharmacy

## 2012-10-02 ENCOUNTER — Ambulatory Visit (INDEPENDENT_AMBULATORY_CARE_PROVIDER_SITE_OTHER): Payer: Medicare Other | Admitting: Psychology

## 2012-10-02 ENCOUNTER — Encounter (HOSPITAL_COMMUNITY): Payer: Self-pay | Admitting: Psychology

## 2012-10-02 DIAGNOSIS — F39 Unspecified mood [affective] disorder: Secondary | ICD-10-CM

## 2012-10-02 DIAGNOSIS — F411 Generalized anxiety disorder: Secondary | ICD-10-CM

## 2012-10-02 DIAGNOSIS — F419 Anxiety disorder, unspecified: Secondary | ICD-10-CM

## 2012-10-02 NOTE — Progress Notes (Signed)
Patient:  Lauren [name]   DOB: 1950/10/23  MR Number: [id number]  Location: BEHAVIORAL [facility] [address] [place] [address] Dept: [phone]  Start:  1 PM End: 2 PM  Provider/Observer:     Hershal Coria PSYD  Chief Complaint:      Chief Complaint  Patient presents with  . Anxiety  . Depression  . Stress  . Trauma    Reason For Service:     The patient has a long history of major depressive condition and ongoing underlying depressive disorder. She is a recovering alcoholic but has many many years this Friday. Significant symptoms of anxiety have played a major role in substance abuse in the past. Significant medical problems and her inability to work have complicated her situation. When all these difficulties are put in the context and relationship to potential workability it is clear that she is disabled not do not expect her to return to gainful employment.  Interventions Strategy:  Cognitive/behavioral psychotherapy  Participation Level:   Active  Participation Quality:  Appropriate      Behavioral Observation:  Well Groomed, Alert, and Appropriate.   Current Psychosocial Factors: The patient comes in very upset and distraught. She reports that her husband was kind into buying a door to door sales and is vacuum clear that caused them a down payment of their extensive Dyson vacuum cleaner and $1400. The patient reports that she was not feeling well and embedded and he did this and it highlights his difficulty coping with other people when they appear to be in distress. The patient reports that this has heightened her fear of loss at not having enough resources and how they separate the money. The patient reports that this is brought other feelings of inadequacy and conflict between her and her husband..   Content of Session:   Review current symptoms and continue to work on therapeutic  interventions for improved coping skills and strategies.  Current Status:   The patient reports that she's had great difficulty completely stopping any benzodiazepine. She reports that she has gone for extended periods of time without these that in great distress. She reports that the longest duration is been 3 full days without any benzodiazepine.  Patient Progress:   Stable and improving  Target Goals:   Reduce the frequency of depressive events including feelings of hopelessness and helplessness, anhedonia, social withdrawal, as well as significant anxiety symptoms related to social avoidance, fear of abandonment and fear of inability to manage and cope with her world. We will also look at continuing complete sobriety.  Last Reviewed:   10/02/2012   Goals Addressed Today:    Target goals addressed today have to do with issues of depression in particular feelings of helplessness and hopelessness.   Impression/Diagnosis:   The patient has a long history of depressive disorder and underlying anxiety disorder. She has had numerous medical issues including severe fibromyalgia and severe arthritis. She has been diagnosed with significant arthritis throughout her body and in particular her hands and her feet. Numerous events and left are overwhelmed and unable to work. The patient has had periodic times of improvement for primary symptoms but there are other times where the symptoms are so problematic that she has not been able to maintain gainful employment.  Diagnosis:    Axis I:  1. Mood disorder   2. Anxiety         Axis II: No diagnosis

## 2012-10-04 ENCOUNTER — Other Ambulatory Visit (HOSPITAL_COMMUNITY): Payer: Self-pay | Admitting: *Deleted

## 2012-10-04 DIAGNOSIS — F329 Major depressive disorder, single episode, unspecified: Secondary | ICD-10-CM

## 2012-10-04 MED ORDER — DULOXETINE HCL 60 MG PO CPEP: 60.0000 mg | ORAL_CAPSULE | Freq: Every day | ORAL | Status: DC

## 2012-10-04 NOTE — Telephone Encounter (Signed)
Phone call referred to Dr.Arfeen from [facility] in Dr.Walker's absence:"Pt lost copy of Cymbalta RX for mail order.Needs 30 day RX for local refill" Left message for pt: Informed pt that RX authorized by Dr.Arfeen for 30 days and sent to local [facility]

## 2012-10-05 ENCOUNTER — Ambulatory Visit (HOSPITAL_COMMUNITY): Payer: Self-pay | Admitting: Psychology

## 2012-10-10 ENCOUNTER — Encounter (HOSPITAL_COMMUNITY): Payer: Self-pay | Admitting: Psychiatry

## 2012-10-10 ENCOUNTER — Ambulatory Visit (INDEPENDENT_AMBULATORY_CARE_PROVIDER_SITE_OTHER): Payer: Medicare Other | Admitting: Psychiatry

## 2012-10-10 VITALS — Wt 158.4 lb

## 2012-10-10 DIAGNOSIS — F32A Depression, unspecified: Secondary | ICD-10-CM

## 2012-10-10 DIAGNOSIS — R52 Pain, unspecified: Secondary | ICD-10-CM

## 2012-10-10 DIAGNOSIS — F489 Nonpsychotic mental disorder, unspecified: Secondary | ICD-10-CM

## 2012-10-10 DIAGNOSIS — F329 Major depressive disorder, single episode, unspecified: Secondary | ICD-10-CM

## 2012-10-10 DIAGNOSIS — F5105 Insomnia due to other mental disorder: Secondary | ICD-10-CM

## 2012-10-10 MED ORDER — GABAPENTIN 100 MG PO CAPS: 100.0000 mg | ORAL_CAPSULE | Freq: Three times a day (TID) | ORAL | Status: DC

## 2012-10-10 MED ORDER — LAMOTRIGINE 100 MG PO TABS: 50.0000 mg | ORAL_TABLET | Freq: Two times a day (BID) | ORAL | Status: DC

## 2012-10-10 MED ORDER — DULOXETINE HCL 60 MG PO CPEP: 60.0000 mg | ORAL_CAPSULE | Freq: Every day | ORAL | Status: DC

## 2012-10-10 MED ORDER — OMEPRAZOLE 20 MG PO CPDR: 20.0000 mg | DELAYED_RELEASE_CAPSULE | Freq: Every day | ORAL | Status: DC

## 2012-10-10 MED ORDER — TRAZODONE HCL 100 MG PO TABS: 100.0000 mg | ORAL_TABLET | Freq: Every day | ORAL | Status: DC

## 2012-10-10 NOTE — Patient Instructions (Addendum)
Yoga is a very helpful exercise method.  On TV or on line [employer] is a source of high quality information about yoga and videos on yoga.  Renee Ramus is the world's number one video yoga instructor according to some experts.  There are exceptional health benefits that can be achieved through yoga.  The main principles of yoga is acceptance, no competition, no comparison, and no judgement.  It is exceptional in helping people meditate and get to a very relaxed state.   "I am Wishes Fulfilled Meditation" by Marylene Buerger and Lyndal Pulley may be helpful for meditation [employer] is a good source for this.  Call if problems or concerns.

## 2012-10-10 NOTE — Progress Notes (Signed)
[facility] [id number] Progress Note ADAH STONEBERG MRN: [id number] DOB: [birth year] Age: 62 y.o.  Date: 10/10/2012 Start Time: 9:05 AM End Time: 9:35 AM  Chief Complaint: Chief Complaint  Patient presents with  . Depression  . Follow-up  . Medication Refill   Subjective: "I've had to use some Ativan to get to sleep. [facility] and walking in nature really help". Depression 5/10 and Anxiety 5/10, where 1 is the best and 10 is the worst.  Pain is 6/10 on the same scale.  History of presenting illness Patient is 62 year old Caucasian female who came for her followup appointment. Pt reports that she is compliant with the psychotropic medications with good benefit and no noticeable side effects now.  She is thinking that her depression needs more Lamictal now.  She is drinking more coffee to stay awake.    Current psychiatric medication Cymbalta 60 mg daily Neurontin 100 mg 3 a day Lamictal 25 mg three times a day Ativan 1 mg 1 tablet at bedtime prescribed by primary care physician  Past psychiatric history Patient has a long history of depression. She has history of inpatient psychiatry treatment. She was last admitted in 2010 at old [facility] due to change mental status. She had tried multiple medication in the past including Lexapro Wellbutrin however she usually respond well on Cymbalta.  Medical history Patient has history of fibromyalgia, osteoarthritis, IBS, and GERD and chronic pain  Family History family history includes Alcohol abuse in her cousin, father, maternal grandfather, maternal grandmother, paternal grandfather, paternal grandmother, and paternal uncle; Anxiety disorder in her maternal uncle and paternal uncle; Depression in her father; Diabetes in her brothers and father; Healthy in her brother; Heart disease in her brother; Irritable bowel syndrome in her mother; OCD in her brothers; Sexual abuse in her mother; and Stroke in her father and  mother.  There is no history of ADD / ADHD, and Bipolar disorder, and Dementia, and Drug abuse, and Paranoid behavior, and Schizophrenia, and Seizures, and Physical abuse, .  Mental status examination Patient is fairly groomed and well dressed. She is emotional but cooperative.  Her speech is fast and pressured.  Her thought process is logical and goal-directed.  She described her mood is better and her affect is improved.  She denies any active or passive suicidal thoughts or homicidal thoughts.  She still has some paranoia about people but she denies any hallucination. Her attention and concentration is fair. There were no obsession present at this time.  She's alert and oriented x3 and her insight judgment and impulse control is okay. Tearful about no body listening to her about her chemical effect on her brain.   Lab Results:  Recent Results (from the past 8736 hour(s))  CBC   Collection Time   02/14/12 11:12 AM      Component Value Range   WBC 6.8  4.0 - 10.5 K/uL   RBC 4.64  3.87 - 5.11 MIL/uL   Hemoglobin 12.3  12.0 - 15.0 g/dL   HCT 84.6  96.2 - 95.2 %   MCV 80.0  78.0 - 100.0 fL   MCH 26.5  26.0 - 34.0 pg   MCHC 33.2  30.0 - 36.0 g/dL   RDW 84.1  32.4 - 40.1 %   Platelets 272  150 - 400 K/uL  HEPATIC FUNCTION PANEL   Collection Time   02/14/12 11:12 AM      Component Value Range   Total Bilirubin 0.4  0.3 - 1.2 mg/dL   Bilirubin, Direct 0.1  0.0 - 0.3 mg/dL   Indirect Bilirubin 0.3  0.0 - 0.9 mg/dL   Alkaline Phosphatase 64  39 - 117 U/L   AST 28  0 - 37 U/L   ALT 25  0 - 35 U/L   Total Protein 7.2  6.0 - 8.3 g/dL   Albumin 4.1  3.5 - 5.2 g/dL  Family doctor is following her labs  Assessment Axis I Maj. depressive disorder, insomnia, pain issues, HIstory of alcoholism. Axis II deferred Axis III see medical history Axis IV mild to moderate  Plan: I took her vitals.  I reviewed CC, tobacco/med/surg Hx, meds effects/ side effects, problem list, therapies and responses as  well as current situation/symptoms discussed options. See orders and pt instructions for more details.  Medical Decision Making Problem Points:  Established problem, worsening (2), Review of last therapy session (1) and Review of psycho-social stressors (1) Data Points:  Review or order clinical lab tests (1) Review of new medications or change in dosage (2)  I certify that outpatient services furnished can reasonably be expected to improve the patient's condition.   Orson Aloe, MD, [facility]

## 2012-10-10 NOTE — Addendum Note (Signed)
Addended by: Mike Craze on: 10/10/2012 09:44 AM   Modules accepted: Orders

## 2012-10-23 ENCOUNTER — Ambulatory Visit (INDEPENDENT_AMBULATORY_CARE_PROVIDER_SITE_OTHER): Payer: Medicare Other | Admitting: Psychology

## 2012-10-23 DIAGNOSIS — F419 Anxiety disorder, unspecified: Secondary | ICD-10-CM

## 2012-10-23 DIAGNOSIS — F411 Generalized anxiety disorder: Secondary | ICD-10-CM

## 2012-10-23 DIAGNOSIS — F39 Unspecified mood [affective] disorder: Secondary | ICD-10-CM

## 2012-10-29 ENCOUNTER — Other Ambulatory Visit (HOSPITAL_COMMUNITY): Payer: Self-pay | Admitting: Psychiatry

## 2012-11-05 ENCOUNTER — Encounter (HOSPITAL_COMMUNITY): Payer: Self-pay | Admitting: Psychology

## 2012-11-05 NOTE — Progress Notes (Signed)
Patient:  Lauren [name]   DOB: January 02, 1951  MR Number: [id number]  Location: BEHAVIORAL [facility] [address] [place] [address] Dept: [phone]  Start:  1 PM End: 2 PM  Hadriel Northup/Observer:     Hershal Coria PSYD  Chief Complaint:      Chief Complaint  Patient presents with  . Anxiety  . Depression  . Stress    Reason For Service:     The patient has a long history of major depressive condition and ongoing underlying depressive disorder. She is a recovering alcoholic but has many many years this Friday. Significant symptoms of anxiety have played a major role in substance abuse in the past. Significant medical problems and her inability to work have complicated her situation. When all these difficulties are put in the context and relationship to potential workability it is clear that she is disabled not do not expect her to return to gainful employment.  Interventions Strategy:  Cognitive/behavioral psychotherapy  Participation Level:   Active  Participation Quality:  Appropriate      Behavioral Observation:  Well Groomed, Alert, and Appropriate.   Current Psychosocial Factors: The patient reports that she and her husband have been doing fairly well recently but she continues to worry about her anxiety and fears and the effects it has on him.   Content of Session:   Review current symptoms and continue to work on therapeutic interventions for improved coping skills and strategies.  Current Status:   The patient is continued to work on reducing benzodiazepine use and reports that this is very difficult sometimes.  Patient Progress:   Stable and improving  Target Goals:   Reduce the frequency of depressive events including feelings of hopelessness and helplessness, anhedonia, social withdrawal, as well as significant anxiety symptoms related to social avoidance, fear of abandonment and fear  of inability to manage and cope with her world. We will also look at continuing complete sobriety.  Last Reviewed:   10/23/2012   Goals Addressed Today:    Target goals addressed today have to do with issues of depression in particular feelings of helplessness and hopelessness.   Impression/Diagnosis:   The patient has a long history of depressive disorder and underlying anxiety disorder. She has had numerous medical issues including severe fibromyalgia and severe arthritis. She has been diagnosed with significant arthritis throughout her body and in particular her hands and her feet. Numerous events and left are overwhelmed and unable to work. The patient has had periodic times of improvement for primary symptoms but there are other times where the symptoms are so problematic that she has not been able to maintain gainful employment.  Diagnosis:    Axis I:  Mood disorder  Anxiety      Axis II: No diagnosis

## 2012-11-12 ENCOUNTER — Telehealth (HOSPITAL_COMMUNITY): Payer: Self-pay | Admitting: Psychiatry

## 2012-11-12 DIAGNOSIS — F329 Major depressive disorder, single episode, unspecified: Secondary | ICD-10-CM

## 2012-11-12 MED ORDER — DULOXETINE HCL 60 MG PO CPEP: 60.0000 mg | ORAL_CAPSULE | Freq: Every day | ORAL | Status: DC

## 2012-11-12 NOTE — Telephone Encounter (Signed)
S/W pt and renewed her meds. She wishes to have an appointment sooner than the current one set for May.

## 2012-11-13 ENCOUNTER — Telehealth (HOSPITAL_COMMUNITY): Payer: Self-pay | Admitting: Psychiatry

## 2012-11-13 DIAGNOSIS — F329 Major depressive disorder, single episode, unspecified: Secondary | ICD-10-CM

## 2012-11-13 MED ORDER — DULOXETINE HCL 60 MG PO CPEP: 60.0000 mg | ORAL_CAPSULE | Freq: Every day | ORAL | Status: DC

## 2012-11-13 NOTE — Telephone Encounter (Signed)
Refill request approved via eScripts.  

## 2012-11-14 ENCOUNTER — Telehealth (HOSPITAL_COMMUNITY): Payer: Self-pay | Admitting: *Deleted

## 2012-11-28 ENCOUNTER — Ambulatory Visit (HOSPITAL_COMMUNITY): Payer: Self-pay | Admitting: Psychiatry

## 2012-12-03 ENCOUNTER — Ambulatory Visit (HOSPITAL_COMMUNITY): Payer: Self-pay | Admitting: Psychology

## 2012-12-04 ENCOUNTER — Ambulatory Visit (HOSPITAL_COMMUNITY): Payer: Self-pay | Admitting: Psychiatry

## 2012-12-07 ENCOUNTER — Ambulatory Visit (HOSPITAL_COMMUNITY): Payer: Self-pay | Admitting: Psychiatry

## 2012-12-19 ENCOUNTER — Encounter (HOSPITAL_COMMUNITY): Payer: Self-pay | Admitting: Psychiatry

## 2012-12-19 ENCOUNTER — Ambulatory Visit (INDEPENDENT_AMBULATORY_CARE_PROVIDER_SITE_OTHER): Payer: Medicare Other | Admitting: Psychiatry

## 2012-12-19 VITALS — Wt 160.2 lb

## 2012-12-19 DIAGNOSIS — F329 Major depressive disorder, single episode, unspecified: Secondary | ICD-10-CM

## 2012-12-19 DIAGNOSIS — F32A Depression, unspecified: Secondary | ICD-10-CM

## 2012-12-19 DIAGNOSIS — F489 Nonpsychotic mental disorder, unspecified: Secondary | ICD-10-CM

## 2012-12-19 DIAGNOSIS — F5105 Insomnia due to other mental disorder: Secondary | ICD-10-CM

## 2012-12-19 DIAGNOSIS — R52 Pain, unspecified: Secondary | ICD-10-CM

## 2012-12-19 MED ORDER — VENLAFAXINE HCL ER 37.5 MG PO CP24: 37.5000 mg | ORAL_CAPSULE | Freq: Every day | ORAL | Status: DC

## 2012-12-19 MED ORDER — DULOXETINE HCL 30 MG PO CPEP: 30.0000 mg | ORAL_CAPSULE | Freq: Every day | ORAL | Status: DC

## 2012-12-19 MED ORDER — GABAPENTIN 100 MG PO CAPS: 100.0000 mg | ORAL_CAPSULE | Freq: Three times a day (TID) | ORAL | Status: DC

## 2012-12-19 NOTE — Progress Notes (Signed)
[facility] [address] Progress Note Lauren [name] MRN: [id number] DOB: 10/13/1950 Age: 62 y.o.  Date: 12/19/2012 Start Time: 1:04 PM End Time: 1:45 PM  Chief Complaint: Chief Complaint  Patient presents with  . Follow-up  . Depression  . Medication Refill   Subjective: "I've just gotten back from the cruise and my mood has been very dark". Depression 6/10 and Anxiety 5/10, where 1 is the best and 10 is the worst.  Pain is 0/10 on the same scale.    History of presenting illness Patient is 62 year old Caucasian female who came for her followup appointment. Pt reports that she is compliant with the psychotropic medications with poor to fair benefit and no noticeable side effects now.  She has noted tiredness and fatigue on the Lamictal  Current psychiatric medication Cymbalta 60 mg daily Neurontin 100 mg 3 a day Lamictal 50 mg once a day Trazodone 50 mg at night Ativan 1 mg 1 tablet at bedtime prescribed by primary care physician  Past psychiatric history Patient has a long history of depression. She has history of inpatient psychiatry treatment. She was last admitted in 2010 at old [facility] due to change mental status. She had tried multiple medication in the past including Lexapro Wellbutrin however she usually respond well on Cymbalta.  Medical history Patient has history of fibromyalgia, osteoarthritis, IBS, and GERD and chronic pain  Family History family history includes Alcohol abuse in her cousin, father, maternal grandfather, maternal grandmother, paternal grandfather, paternal grandmother, and paternal uncle; Anxiety disorder in her maternal uncle and paternal uncle; Depression in her father; Diabetes in her brothers and father; Healthy in her brother; Heart disease in her brother; Irritable bowel syndrome in her mother; OCD in her brothers; Sexual abuse in her mother; and Stroke in her father and mother.  There is no history of ADD / ADHD, and Bipolar  disorder, and Dementia, and Drug abuse, and Paranoid behavior, and Schizophrenia, and Seizures, and Physical abuse, .  Mental status examination Patient is fairly groomed and well dressed. She is emotional but cooperative.  Her speech is fast and pressured.  Her thought process is logical and goal-directed.  She described her mood is better and her affect is improved.  She denies any active or passive suicidal thoughts or homicidal thoughts.  She still has some paranoia about people but she denies any hallucination. Her attention and concentration is fair. There were no obsession present at this time.  She's alert and oriented x3 and her insight judgment and impulse control is okay. Tearful about no body listening to her about her chemical effect on her brain.   Lab Results:  Results for orders placed in visit on 02/14/12 (from the past 8736 hour(s))  CBC   Collection Time    02/14/12 11:12 AM      Result Value Range   WBC 6.8  4.0 - 10.5 K/uL   RBC 4.64  3.87 - 5.11 MIL/uL   Hemoglobin 12.3  12.0 - 15.0 g/dL   HCT 19.1  47.8 - 29.5 %   MCV 80.0  78.0 - 100.0 fL   MCH 26.5  26.0 - 34.0 pg   MCHC 33.2  30.0 - 36.0 g/dL   RDW 62.1  30.8 - 65.7 %   Platelets 272  150 - 400 K/uL  HEPATIC FUNCTION PANEL   Collection Time    02/14/12 11:12 AM      Result Value Range   Total Bilirubin 0.4  0.3 -  1.2 mg/dL   Bilirubin, Direct 0.1  0.0 - 0.3 mg/dL   Indirect Bilirubin 0.3  0.0 - 0.9 mg/dL   Alkaline Phosphatase 64  39 - 117 U/L   AST 28  0 - 37 U/L   ALT 25  0 - 35 U/L   Total Protein 7.2  6.0 - 8.3 g/dL   Albumin 4.1  3.5 - 5.2 g/dL  Family doctor is following her labs  Assessment Axis I Maj. depressive disorder, insomnia, pain issues, HIstory of alcoholism. Axis II deferred Axis III see medical history Axis IV mild to moderate  Plan: I took her vitals.  I reviewed CC, tobacco/med/surg Hx, meds effects/ side effects, problem list, therapies and responses as well as current  situation/symptoms discussed options. Reduce Cymbalta and Lamictal to get off and start Effexor.  Cut out sugar See orders and pt instructions for more details.  MEDICATIONS this encounter: No orders of the defined types were placed in this encounter.    Medical Decision Making Problem Points:  Established problem, worsening (2), Review of last therapy session (1) and Review of psycho-social stressors (1) Data Points:  Review or order clinical lab tests (1) Review of medication regiment & side effects (2) Review of new medications or change in dosage (2)  I certify that outpatient services furnished can reasonably be expected to improve the patient's condition.   Orson Aloe, MD, [facility]

## 2012-12-19 NOTE — Patient Instructions (Addendum)
CUT BACK/CUT OUT on sugar and carbohydrates, that means very limited fruits and starchy vegetables and very limited grains, breads  The goal is low GLYCEMIC INDEX.  CUT OUT all wheat, rye, or barley for the GLUTEN in them.  HIGH fat and LOW carbohydrate diet is the KEY.  Eat avocados, eggs, lean meat like grass fed beef and chicken  Nuts and seeds would be good foods as well.   Stevia is an excellent sweetener.  Safe for the brain.   Truvia is also a good safe sweetener, not the baking blend form of Truvia  Almond butter is awesome.  Check out all this on the Internet.  Dr Purlmutter is on the Internet with some good info about this.   http://www.drperlmutter.com is where that is.  An excellent site for info on this diet is http://paleoleap.com  [employer] makes dark chocolate that is sweetened with Stevia that is safe.  Set a timer for 8 minutes and walk for that amount of time in the house or in the yard.  Mark "8" on a calendar for that day.  Do that every day this week.  Then next week increase the time to 9 minutes and then mark the calendar with a 9 for that day.  Each week increase your exercise by one minute.  Keep a record of this so you can see what progress you are making.  Do this every day, just like eating and sleeping.  It is good for pain control, depression, and for your soul/spirit.  Bring the record in for your next visit so we can talk about your effort and how you feel with the new exercise program going and working for you.  Yoga is a very helpful exercise method.  On TV, on line, or by DVD [employer] is a source of high quality information about yoga and videos on yoga.  Rodney Yee is the world's number one video yoga instructor according to some experts.  There are exceptional health benefits that can be achieved through yoga.  The main principles of yoga is acceptance, no competition, no comparison, and no judgement.  It is exceptional in helping people  meditate and get to a very relaxed state.   Take care of yourself.  No one else is standing up to do the job and only you know what you need.   GET SERIOUS about taking care of yourself.  Do the next right thing and that often means doing something to care for yourself along the lines of are you hungry, are you angry, are you lonely, are you tired, are you scared?  HALTS is what that stands for.  Call if problems or concerns. 

## 2013-01-03 ENCOUNTER — Other Ambulatory Visit (HOSPITAL_COMMUNITY): Payer: Self-pay | Admitting: Cardiovascular Disease

## 2013-01-03 DIAGNOSIS — R079 Chest pain, unspecified: Secondary | ICD-10-CM

## 2013-01-03 DIAGNOSIS — R0602 Shortness of breath: Secondary | ICD-10-CM

## 2013-01-07 ENCOUNTER — Ambulatory Visit (HOSPITAL_COMMUNITY): Payer: Self-pay | Admitting: Psychiatry

## 2013-01-07 ENCOUNTER — Ambulatory Visit (INDEPENDENT_AMBULATORY_CARE_PROVIDER_SITE_OTHER): Payer: Medicare Other | Admitting: Psychology

## 2013-01-07 DIAGNOSIS — F419 Anxiety disorder, unspecified: Secondary | ICD-10-CM

## 2013-01-07 DIAGNOSIS — F39 Unspecified mood [affective] disorder: Secondary | ICD-10-CM

## 2013-01-07 DIAGNOSIS — F411 Generalized anxiety disorder: Secondary | ICD-10-CM

## 2013-01-11 ENCOUNTER — Encounter (HOSPITAL_COMMUNITY): Payer: Self-pay | Admitting: Psychiatry

## 2013-01-11 ENCOUNTER — Ambulatory Visit (INDEPENDENT_AMBULATORY_CARE_PROVIDER_SITE_OTHER): Payer: Medicare Other | Admitting: Psychiatry

## 2013-01-11 VITALS — BP 136/86 | Ht 65.5 in | Wt 155.2 lb

## 2013-01-11 DIAGNOSIS — F329 Major depressive disorder, single episode, unspecified: Secondary | ICD-10-CM

## 2013-01-11 DIAGNOSIS — F489 Nonpsychotic mental disorder, unspecified: Secondary | ICD-10-CM

## 2013-01-11 DIAGNOSIS — F5105 Insomnia due to other mental disorder: Secondary | ICD-10-CM

## 2013-01-11 DIAGNOSIS — R52 Pain, unspecified: Secondary | ICD-10-CM

## 2013-01-11 DIAGNOSIS — F32A Depression, unspecified: Secondary | ICD-10-CM

## 2013-01-11 MED ORDER — LIDOCAINE 5 % EX PTCH: 1.0000 | MEDICATED_PATCH | Freq: Two times a day (BID) | CUTANEOUS | Status: AC

## 2013-01-11 MED ORDER — DULOXETINE HCL 30 MG PO CPEP: 30.0000 mg | ORAL_CAPSULE | Freq: Two times a day (BID) | ORAL | Status: DC

## 2013-01-11 MED ORDER — OMEPRAZOLE 20 MG PO CPDR: 20.0000 mg | DELAYED_RELEASE_CAPSULE | Freq: Every day | ORAL | Status: DC

## 2013-01-11 MED ORDER — LIDOCAINE 5 % EX PTCH: 1.0000 | MEDICATED_PATCH | Freq: Two times a day (BID) | CUTANEOUS | Status: DC

## 2013-01-11 NOTE — Patient Instructions (Addendum)
Hold off on the Trazodone and Effexor for now.  Push the Neurontin to 3 or 4 or 5 capsules several times a day.  Up to 4 times a day.  Take care of yourself.  No one else is standing up to do the job and only you know what you need.   GET SERIOUS about taking care of yourself.  Do the next right thing and that often means doing something to care for yourself along the lines of are you hungry, are you angry, are you lonely, are you tired, are you scared?  HALTS is what that stands for.  Call if problems or concerns.

## 2013-01-11 NOTE — Progress Notes (Signed)
[facility] [address] Progress Note Lauren [name] MRN: [id number] DOB: 1950/12/23 Age: 62 y.o.  Date: 01/11/2013 Start Time: 2:00 PM End Time: 2:45 PM  Chief Complaint: Chief Complaint  Patient presents with  . Depression  . Anxiety  . Follow-up  . Medication Refill   Subjective: "I've gotten off the pain meds, the Ativan, and the Lamictal and mentally I'm not doing well". Depression 10/10 and Anxiety 10/10, where 1 is the best and 10 is the worst.  Pain is 10/10 on the same scale involving R knee and hip  History of presenting illness Patient is 62 year old Caucasian female who came for her followup appointment. Pt reports that she is compliant with the psychotropic medications with poor benefit and several side effects now.  She has noted nightmares on the Trazodone and real jittery on the combination of Effexor with the Cymbalta.  Current psychiatric medication Cymbalta 30 mg daily Neurontin 100 mg 3 a day Lamictal 50 mg once a day  Past psychiatric history Patient has a long history of depression. She has history of inpatient psychiatry treatment. She was last admitted in 2010 at old [facility] due to change mental status. She had tried multiple medication in the past including Lexapro Wellbutrin however she usually respond well on Cymbalta. Allergies: Allergies  Allergen Reactions  . Elavil (Amitriptyline) Other (See Comments)    Vivid dreams and almost suicidal  . Flagyl (Metronidazole) Itching  . Vistaril (Hydroxyzine Hcl) Other (See Comments)    Got higher than a kite on too much of this.  . Geodon (Ziprasidone Hydrochloride)   . Lactose Intolerance (Gi)   Medical History: Past Medical History  Diagnosis Date  . Depression   . Osteoarthritis   . Fibromyalgia   . IBS (irritable bowel syndrome)   . GERD (gastroesophageal reflux disease)   . Prediabetes   . Chronic pain   Surgical History: Past Surgical History  Procedure Laterality Date  .  Tonsillectomy    . Cholecystectomy    . Colonoscopy    . Upper gastrointestinal endoscopy    . Mandible surgery  09/06/1979  Family History family history includes Alcohol abuse in her cousin, father, maternal grandfather, maternal grandmother, paternal grandfather, paternal grandmother, and paternal uncle; Anxiety disorder in her maternal uncle and paternal uncle; Depression in her father; Diabetes in her brothers and father; Healthy in her brother; Heart disease in her brother; Irritable bowel syndrome in her mother; OCD in her brothers; Sexual abuse in her mother; and Stroke in her father and mother.  There is no history of ADD / ADHD, and Bipolar disorder, and Dementia, and Drug abuse, and Paranoid behavior, and Schizophrenia, and Seizures, and Physical abuse, . Reviewed al this again to day in the appointment  Mental status examination Patient is fairly groomed and well dressed. She is emotional but cooperative.  Her speech is fast and pressured.  Her thought process is logical and goal-directed.  She described her mood is better and her affect is improved.  She denies any active or passive suicidal thoughts or homicidal thoughts.  She still has some paranoia about people but she denies any hallucination. Her attention and concentration is fair. There were no obsession present at this time.  She's alert and oriented x3 and her insight judgment and impulse control is okay. Tearful about no body listening to her about her chemical effect on her brain.   Lab Results:  Results for orders placed in visit on 02/14/12 (from the past 8736  hour(s))  CBC   Collection Time    02/14/12 11:12 AM      Result Value Range   WBC 6.8  4.0 - 10.5 K/uL   RBC 4.64  3.87 - 5.11 MIL/uL   Hemoglobin 12.3  12.0 - 15.0 g/dL   HCT 11.9  14.7 - 82.9 %   MCV 80.0  78.0 - 100.0 fL   MCH 26.5  26.0 - 34.0 pg   MCHC 33.2  30.0 - 36.0 g/dL   RDW 56.2  13.0 - 86.5 %   Platelets 272  150 - 400 K/uL  HEPATIC FUNCTION  PANEL   Collection Time    02/14/12 11:12 AM      Result Value Range   Total Bilirubin 0.4  0.3 - 1.2 mg/dL   Bilirubin, Direct 0.1  0.0 - 0.3 mg/dL   Indirect Bilirubin 0.3  0.0 - 0.9 mg/dL   Alkaline Phosphatase 64  39 - 117 U/L   AST 28  0 - 37 U/L   ALT 25  0 - 35 U/L   Total Protein 7.2  6.0 - 8.3 g/dL   Albumin 4.1  3.5 - 5.2 g/dL  Family doctor is following her labs  Assessment Axis I Maj. depressive disorder, insomnia, pain issues, HIstory of alcoholism. Axis II deferred Axis III see medical history Axis IV mild to moderate  Plan: I took her vitals.  I reviewed CC, tobacco/med/surg Hx, meds effects/ side effects, problem list, therapies and responses as well as current situation/symptoms discussed options. Push Neurontin for optimal pain and anxiety and withdrawal control.  Hold off on Trazodone and Effexor for now.   See orders and pt instructions for more details.  MEDICATIONS this encounter: Meds ordered this encounter  Medications  . omeprazole (PRILOSEC) 20 MG capsule    Sig: Take 1 capsule (20 mg total) by mouth daily.    Dispense:  30 capsule    Refill:  2  . DISCONTD: DULoxetine (CYMBALTA) 30 MG capsule    Sig: Take 1 capsule (30 mg total) by mouth 2 (two) times daily.    Dispense:  180 capsule    Refill:  0    90 day supply  . DULoxetine (CYMBALTA) 30 MG capsule    Sig: Take 1 capsule (30 mg total) by mouth 2 (two) times daily.    Dispense:  60 capsule    Refill:  0    90 day supply  . DISCONTD: lidocaine (LIDODERM) 5 %    Sig: Place 1 patch onto the skin every 12 (twelve) hours. Remove & Discard patch within 12 hours or as directed by MD    Dispense:  30 patch    Refill:  1  . lidocaine (LIDODERM) 5 %    Sig: Place 1 patch onto the skin every 12 (twelve) hours. Remove & Discard patch within 12 hours or as directed by MD    Dispense:  90 patch    Refill:  1    90 day supply    Medical Decision Making Problem Points:  Established problem,  worsening (2), Review of last therapy session (1) and Review of psycho-social stressors (1) Data Points:  Review or order clinical lab tests (1) Review of medication regiment & side effects (2) Review of new medications or change in dosage (2)  I certify that outpatient services furnished can reasonably be expected to improve the patient's condition.   Orson Aloe, MD, [facility]

## 2013-01-16 ENCOUNTER — Ambulatory Visit (HOSPITAL_COMMUNITY): Payer: Self-pay | Admitting: Psychiatry

## 2013-01-16 ENCOUNTER — Encounter (HOSPITAL_COMMUNITY): Payer: Self-pay | Admitting: Psychology

## 2013-01-16 NOTE — Progress Notes (Signed)
Patient:  Lauren [name]   DOB: [birth year]  MR Number: [id number]  Location: BEHAVIORAL [facility] [address] [place] [address] Dept: [phone]  Start:  1 PM End: 2 PM  Provider/Observer:     Hershal Coria PSYD  Chief Complaint:      Chief Complaint  Patient presents with  . Agitation  . Anxiety  . Depression  . Stress  . Trauma    Reason For Service:     The patient has a long history of major depressive condition and ongoing underlying depressive disorder. She is a recovering alcoholic but has many many years this Friday. Significant symptoms of anxiety have played a major role in substance abuse in the past. Significant medical problems and her inability to work have complicated her situation. When all these difficulties are put in the context and relationship to potential workability it is clear that she is disabled not do not expect her to return to gainful employment.  Interventions Strategy:  Cognitive/behavioral psychotherapy  Participation Level:   Active  Participation Quality:  Appropriate      Behavioral Observation:  Well Groomed, Alert, and Appropriate.   Current Psychosocial Factors: The patient reports that she returned from her cruise and has been having a great deal of difficulty recently with feelings of guilt about staying so much money even though this is something that they ultimately before. The patient reports that as she expected her husband did very little around the house and she had a lot of cleaning and care of the house to do what she returned. This is made her pain symptoms worse.   Content of Session:   Review current symptoms and continue to work on therapeutic interventions for improved coping skills and strategies.  Current Status:   The patient is continued to work on reducing benzodiazepine use and reports that this is very difficult  sometimes.  Patient Progress:   Stable and improving  Target Goals:   Reduce the frequency of depressive events including feelings of hopelessness and helplessness, anhedonia, social withdrawal, as well as significant anxiety symptoms related to social avoidance, fear of abandonment and fear of inability to manage and cope with her world. We will also look at continuing complete sobriety.  Last Reviewed:   01/07/2013   Goals Addressed Today:    Target goals addressed today have to do with issues of depression in particular feelings of helplessness and hopelessness.   Impression/Diagnosis:   The patient has a long history of depressive disorder and underlying anxiety disorder. She has had numerous medical issues including severe fibromyalgia and severe arthritis. She has been diagnosed with significant arthritis throughout her body and in particular her hands and her feet. Numerous events and left are overwhelmed and unable to work. The patient has had periodic times of improvement for primary symptoms but there are other times where the symptoms are so problematic that she has not been able to maintain gainful employment.  Diagnosis:    Axis I:  Mood disorder  Anxiety      Axis II: No diagnosis

## 2013-01-17 ENCOUNTER — Ambulatory Visit (HOSPITAL_COMMUNITY)
Admission: RE | Admit: 2013-01-17 | Discharge: 2013-01-17 | Disposition: A | Discharge status: Home / Self Care | Payer: Medicare Other | Source: Ambulatory Visit | Attending: Cardiovascular Disease | Admitting: Cardiovascular Disease

## 2013-01-17 ENCOUNTER — Ambulatory Visit (HOSPITAL_BASED_OUTPATIENT_CLINIC_OR_DEPARTMENT_OTHER)
Admission: RE | Admit: 2013-01-17 | Discharge: 2013-01-17 | Disposition: A | Discharge status: Home / Self Care | Payer: Medicare Other | Source: Ambulatory Visit | Attending: Cardiovascular Disease | Admitting: Cardiovascular Disease

## 2013-01-17 DIAGNOSIS — I359 Nonrheumatic aortic valve disorder, unspecified: Secondary | ICD-10-CM | POA: Insufficient documentation

## 2013-01-17 DIAGNOSIS — R0602 Shortness of breath: Secondary | ICD-10-CM

## 2013-01-17 DIAGNOSIS — R079 Chest pain, unspecified: Secondary | ICD-10-CM | POA: Insufficient documentation

## 2013-01-17 DIAGNOSIS — F17200 Nicotine dependence, unspecified, uncomplicated: Secondary | ICD-10-CM | POA: Insufficient documentation

## 2013-01-17 DIAGNOSIS — K219 Gastro-esophageal reflux disease without esophagitis: Secondary | ICD-10-CM | POA: Insufficient documentation

## 2013-01-17 MED ORDER — REGADENOSON 0.4 MG/5ML IV SOLN
0.4000 mg | Freq: Once | INTRAVENOUS | Status: AC
  Administered 2013-01-17: 0.4 mg via INTRAVENOUS

## 2013-01-17 MED ORDER — TECHNETIUM TC 99M SESTAMIBI GENERIC - CARDIOLITE
11.0000 | Freq: Once | INTRAVENOUS | Status: AC | PRN
  Administered 2013-01-17: 11 via INTRAVENOUS

## 2013-01-17 MED ORDER — TECHNETIUM TC 99M SESTAMIBI GENERIC - CARDIOLITE
32.6000 | Freq: Once | INTRAVENOUS | Status: AC | PRN
  Administered 2013-01-17: 33 via INTRAVENOUS

## 2013-01-17 MED ORDER — AMINOPHYLLINE 25 MG/ML IV SOLN
75.0000 mg | Freq: Once | INTRAVENOUS | Status: AC
  Administered 2013-01-17: 75 mg via INTRAVENOUS

## 2013-01-17 NOTE — Progress Notes (Signed)
2D Echo Performed 01/17/2013    Mitsy Owen, RCS  

## 2013-01-17 NOTE — Procedures (Addendum)
[facility] [address] [place] [address] [phone]  Cardiology Nuclear Med Study  Lauren [name] is a 62 y.o. female     MRN : [id number]     DOB: May 27, 1951  Procedure Date: 01/17/2013  Nuclear Med Background Indication for Stress Test:  Evaluation for Ischemia History:  COPD Cardiac Risk Factors: Family History - CAD, History of Smoking, Lipids, NIDDM and Overweight  Symptoms:  Chest Pain, Dizziness, DOE, Fatigue, Light-Headedness, Near Syncope, Palpitations and SOB   Nuclear Pre-Procedure Caffeine/Decaff Intake:  10:00pm NPO After: 8:00am   IV Site: R Forearm  IV 0.9% NS with Angio Cath:  22g  Chest Size (in):  N/A  IV Started by: Emmit Pomfret, RN  Height: 5\' 5"  (1.651 m)  Cup Size: C  BMI:  Body mass index is 27.62 kg/(m^2). Weight:  166 lb (75.297 kg)   Tech Comments:  N/A    Nuclear Med Study 1 or 2 day study: 1 day  Stress Test Type:  Lexiscan  Order Authorizing Provider:  Thurmon Fair, MD   Resting Radionuclide: Technetium 93m Sestamibi  Resting Radionuclide Dose: 11.0 mCi   Stress Radionuclide:  Technetium 72m Sestamibi  Stress Radionuclide Dose: 32.6 mCi           Stress Protocol Rest HR: 80 Stress HR: 97  Rest BP: 125/87 Stress BP: 103/56  Exercise Time (min): n/a METS: n/a   Predicted Max HR: 157 bpm % Max HR: 61.78 bpm Rate Pressure Product: 21308  Dose of Adenosine (mg):  n/a Dose of Lexiscan: 0.4 mg  Dose of Atropine (mg): n/a Dose of Dobutamine: n/a mcg/kg/min (at max HR)  Stress Test Technologist: Esperanza Sheets, CCT Nuclear Technologist: Gonzella Lex, CNMT   Rest Procedure:  Myocardial perfusion imaging was performed at rest 45 minutes following the intravenous administration of Technetium 24m Sestamibi. Stress Procedure:  The patient received IV Lexiscan 0.4 mg over 15-seconds.  Technetium 95m Sestamibi injected at 30-seconds.  There were no significant changes with Lexiscan.   Quantitative spect images were obtained after a 45 minute delay.  Transient Ischemic Dilatation (Normal <1.22):  1.04 Lung/Heart Ratio (Normal <0.45):  0.22 QGS EDV:  48 ml QGS ESV:  11 ml LV Ejection Fraction: 77%     Rest ECG: NSR - Normal EKG  Stress ECG: No significant change from baseline ECG  QPS Raw Data Images:  Normal; no motion artifact; normal heart/lung ratio. Stress Images:  Normal homogeneous uptake in all areas of the myocardium. Rest Images:  Normal homogeneous uptake in all areas of the myocardium. Subtraction (SDS):  No evidence of ischemia.  Impression Exercise Capacity:  Lexiscan with no exercise. BP Response:  Normal blood pressure response. Clinical Symptoms:  No significant symptoms noted. ECG Impression:  No significant ECG changes with Lexiscan. Comparison with Prior Nuclear Study: No previous nuclear study performed  Overall Impression:  Normal stress nuclear study.  LV Wall Motion:  NL LV Function; NL Wall Motion   Kare Dado, MD  01/17/2013 7:25 PM

## 2013-01-21 ENCOUNTER — Ambulatory Visit (HOSPITAL_COMMUNITY): Payer: Self-pay | Admitting: Psychology

## 2013-01-21 NOTE — Progress Notes (Signed)
LMOM with normal nuc results.

## 2013-01-24 ENCOUNTER — Ambulatory Visit (HOSPITAL_COMMUNITY): Payer: Self-pay | Admitting: Psychology

## 2013-01-30 ENCOUNTER — Telehealth: Payer: Self-pay | Admitting: Cardiovascular Disease

## 2013-02-01 ENCOUNTER — Ambulatory Visit (INDEPENDENT_AMBULATORY_CARE_PROVIDER_SITE_OTHER): Payer: Medicare Other | Admitting: Psychiatry

## 2013-02-01 ENCOUNTER — Encounter (HOSPITAL_COMMUNITY): Payer: Self-pay | Admitting: Psychiatry

## 2013-02-01 VITALS — BP 131/87 | HR 93 | Ht 65.25 in | Wt 155.6 lb

## 2013-02-01 DIAGNOSIS — F32A Depression, unspecified: Secondary | ICD-10-CM

## 2013-02-01 DIAGNOSIS — F5105 Insomnia due to other mental disorder: Secondary | ICD-10-CM

## 2013-02-01 DIAGNOSIS — G4700 Insomnia, unspecified: Secondary | ICD-10-CM

## 2013-02-01 DIAGNOSIS — R52 Pain, unspecified: Secondary | ICD-10-CM

## 2013-02-01 DIAGNOSIS — F329 Major depressive disorder, single episode, unspecified: Secondary | ICD-10-CM

## 2013-02-01 MED ORDER — GABAPENTIN 300 MG PO CAPS: 300.0000 mg | ORAL_CAPSULE | Freq: Four times a day (QID) | ORAL | Status: DC

## 2013-02-01 NOTE — Progress Notes (Signed)
[facility] [address] Progress Note Lauren [name] MRN: [id number] DOB: [birth year] Age: 62 y.o.  Date: 02/01/2013 Start Time: 2:00 PM End Time: 2:14 PM  Chief Complaint: Chief Complaint  Patient presents with  . Depression  . Follow-up  . Medication Refill   Subjective: "I'm on 1 Ativan at night and I really like theNeurontinl". Depression 5/10 and Anxiety 5/10, where 1 is the best and 10 is the worst.  Pain is 5/10 with the hip  The Neurontin has been great for the fibromyalgia  History of presenting illness Patient is 62 year old Caucasian female who came for her followup appointment. Pt reports that she is compliant with the psychotropic medications with good benefit and no noticeable side effects now.  Doing well off Trazodone and Effexor.  Current psychiatric medication Cymbalta 30 mg twice a day Neurontin 100 mg 24 a day will switch to 300 mg caps  Past psychiatric history Patient has a long history of depression. She has history of inpatient psychiatry treatment. She was last admitted in 2010 at old [facility] due to change mental status. She had tried multiple medication in the past including Lexapro Wellbutrin however she usually respond well on Cymbalta. Allergies: Allergies  Allergen Reactions  . Elavil (Amitriptyline) Other (See Comments)    Vivid dreams and almost suicidal  . Flagyl (Metronidazole) Itching  . Vistaril (Hydroxyzine Hcl) Other (See Comments)    Got higher than a kite on too much of this.  . Geodon (Ziprasidone Hydrochloride)   . Lactose Intolerance (Gi)   Medical History: Past Medical History  Diagnosis Date  . Depression   . Osteoarthritis   . Fibromyalgia   . IBS (irritable bowel syndrome)   . GERD (gastroesophageal reflux disease)   . Prediabetes   . Chronic pain   Surgical History: Past Surgical History  Procedure Laterality Date  . Tonsillectomy    . Cholecystectomy    . Colonoscopy    . Upper gastrointestinal  endoscopy    . Mandible surgery  09/06/1979  Family History family history includes Alcohol abuse in her cousin, father, maternal grandfather, maternal grandmother, paternal grandfather, paternal grandmother, and paternal uncle; Anxiety disorder in her maternal uncle and paternal uncle; Depression in her father; Diabetes in her brothers and father; Healthy in her brother; Heart disease in her brother; Irritable bowel syndrome in her mother; OCD in her brothers; Sexual abuse in her mother; and Stroke in her father and mother.  There is no history of ADD / ADHD, and Bipolar disorder, and Dementia, and Drug abuse, and Paranoid behavior, and Schizophrenia, and Seizures, and Physical abuse, . Reviewed al this again to day in the appointment and nothing has changed  Mental status examination Patient is fairly groomed and well dressed. She is emotional but cooperative.  Her speech is fast and pressured.  Her thought process is logical and goal-directed.  She described her mood is better and her affect is improved.  She denies any active or passive suicidal thoughts or homicidal thoughts.  She still has some paranoia about people but she denies any hallucination. Her attention and concentration is fair. There were no obsession present at this time.  She's alert and oriented x3 and her insight judgment and impulse control is okay. Tearful about no body listening to her about her chemical effect on her brain.   Lab Results:  Results for orders placed in visit on 02/14/12 (from the past 8736 hour(s))  CBC   Collection Time    02/14/12  11:12 AM      Result Value Range   WBC 6.8  4.0 - 10.5 K/uL   RBC 4.64  3.87 - 5.11 MIL/uL   Hemoglobin 12.3  12.0 - 15.0 g/dL   HCT 40.9  81.1 - 91.4 %   MCV 80.0  78.0 - 100.0 fL   MCH 26.5  26.0 - 34.0 pg   MCHC 33.2  30.0 - 36.0 g/dL   RDW 78.2  95.6 - 21.3 %   Platelets 272  150 - 400 K/uL  HEPATIC FUNCTION PANEL   Collection Time    02/14/12 11:12 AM      Result  Value Range   Total Bilirubin 0.4  0.3 - 1.2 mg/dL   Bilirubin, Direct 0.1  0.0 - 0.3 mg/dL   Indirect Bilirubin 0.3  0.0 - 0.9 mg/dL   Alkaline Phosphatase 64  39 - 117 U/L   AST 28  0 - 37 U/L   ALT 25  0 - 35 U/L   Total Protein 7.2  6.0 - 8.3 g/dL   Albumin 4.1  3.5 - 5.2 g/dL  Family doctor is following her labs  Assessment Axis I Maj. depressive disorder, insomnia, pain issues, HIstory of alcoholism. Axis II deferred Axis III see medical history Axis IV mild to moderate  Plan: I took her vitals.  I reviewed CC, tobacco/med/surg Hx, meds effects/ side effects, problem list, therapies and responses as well as current situation/symptoms discussed options. Push Neurontin further for optimal pain and anxiety and withdrawal control.   See orders and pt instructions for more details.  MEDICATIONS this encounter: No orders of the defined types were placed in this encounter.    Medical Decision Making Problem Points:  Established problem, worsening (2), Review of last therapy session (1) and Review of psycho-social stressors (1) Data Points:  Review or order clinical lab tests (1) Review of medication regiment & side effects (2) Review of new medications or change in dosage (2)  I certify that outpatient services furnished can reasonably be expected to improve the patient's condition.   Orson Aloe, MD, [facility]

## 2013-02-01 NOTE — Patient Instructions (Addendum)
Try more of the Neurontin.  Take care of yourself.  No one else is standing up to do the job and only you know what you need.   GET SERIOUS about taking care of yourself.  Do the next right thing and that often means doing something to care for yourself along the lines of are you hungry, are you angry, are you lonely, are you tired, are you scared?  [employer] is what that stands for.  Call if problems or concerns.

## 2013-02-04 ENCOUNTER — Telehealth: Payer: Self-pay | Admitting: Cardiovascular Disease

## 2013-02-06 ENCOUNTER — Ambulatory Visit: Payer: Self-pay | Admitting: Cardiovascular Disease

## 2013-02-07 ENCOUNTER — Encounter (INDEPENDENT_AMBULATORY_CARE_PROVIDER_SITE_OTHER): Payer: Self-pay | Admitting: *Deleted

## 2013-02-20 ENCOUNTER — Other Ambulatory Visit (HOSPITAL_COMMUNITY): Payer: Self-pay | Admitting: Psychiatry

## 2013-02-21 ENCOUNTER — Telehealth (HOSPITAL_COMMUNITY): Payer: Self-pay | Admitting: Psychiatry

## 2013-02-21 DIAGNOSIS — F329 Major depressive disorder, single episode, unspecified: Secondary | ICD-10-CM

## 2013-02-21 MED ORDER — DULOXETINE HCL 30 MG PO CPEP: 30.0000 mg | ORAL_CAPSULE | Freq: Two times a day (BID) | ORAL | Status: DC

## 2013-02-21 NOTE — Telephone Encounter (Signed)
eScript sent for 30 day supply

## 2013-03-01 ENCOUNTER — Ambulatory Visit (HOSPITAL_COMMUNITY): Payer: Self-pay | Admitting: Psychiatry

## 2013-03-11 ENCOUNTER — Ambulatory Visit (INDEPENDENT_AMBULATORY_CARE_PROVIDER_SITE_OTHER): Payer: Self-pay | Admitting: Internal Medicine

## 2013-03-12 ENCOUNTER — Ambulatory Visit (INDEPENDENT_AMBULATORY_CARE_PROVIDER_SITE_OTHER): Payer: Medicare Other | Admitting: Internal Medicine

## 2013-03-12 ENCOUNTER — Encounter (INDEPENDENT_AMBULATORY_CARE_PROVIDER_SITE_OTHER): Payer: Self-pay | Admitting: Internal Medicine

## 2013-03-12 LAB — COMPREHENSIVE METABOLIC PANEL
AST: 27 U/L (ref 0–37)
Albumin: 4.1 g/dL (ref 3.5–5.2)
Alkaline Phosphatase: 70 U/L (ref 39–117)
Potassium: 4.3 mEq/L (ref 3.5–5.3)
Sodium: 137 mEq/L (ref 135–145)
Total Protein: 7.2 g/dL (ref 6.0–8.3)

## 2013-03-12 NOTE — Patient Instructions (Addendum)
Cmet, Hep C genotype, Hep C quaint. OV in October

## 2013-03-12 NOTE — Progress Notes (Signed)
Subjective:     Patient ID: Lauren [name], female   DOB: Jan 09, 1951, 62 y.o.   MRN: [id number]  HPI Here for f/u of her Hepatitis C. She was diagnosed with Hepatitis C in 1991  She had a liver biopsy in 2012 which revealed minimal chronic active hepatitis consistent with hepatitis C characterized by : Minimal activity, Grade 1.  Genotype 1 A on 01/16/2001). She tells me she is doing okay. [facility] is good. She has gained 11 pounds since her last visit.  No abdominal pain. She tells me she had a black tarry stool last week x 1. Now resolved.  Takes Lomotil on a prn basis for diarrhea. Acid reflux occasionally Risk factor for Hepatitis C was a blood transfusion.      Review of Systems see hpi Current Outpatient Prescriptions  Medication Sig Dispense Refill  . calcium-vitamin D (OSCAL WITH D) 500-200 MG-UNIT per tablet Take 1 tablet by mouth 2 (two) times daily.      . diphenoxylate-atropine (LOMOTIL) 2.5-0.025 MG per tablet Take 1 tablet by mouth as needed.      . DULoxetine (CYMBALTA) 30 MG capsule Take 1 capsule (30 mg total) by mouth 2 (two) times daily.  60 capsule  0  . gabapentin (NEURONTIN) 300 MG capsule Take 100 mg by mouth 4 (four) times daily.      Marland Kitchen HYDROcodone-acetaminophen (NORCO) 10-325 MG per tablet 1 tablet as needed.       . lidocaine (LIDODERM) 5 % Place 1 patch onto the skin every 12 (twelve) hours. Remove & Discard patch within 12 hours or as directed by MD  90 patch  1  . LORazepam (ATIVAN) 1 MG tablet Take 1 mg by mouth at bedtime as needed and may repeat dose one time if needed for anxiety.      . meloxicam (MOBIC) 7.5 MG tablet Take 7.5 mg by mouth daily.      . metformin (FORTAMET) 500 MG (OSM) 24 hr tablet Take 500 mg by mouth daily with breakfast.      . omeprazole (PRILOSEC) 20 MG capsule Take 1 capsule (20 mg total) by mouth daily.  30 capsule  2  . oxyCODONE-acetaminophen (PERCOCET) 10-325 MG per tablet       . pravastatin  (PRAVACHOL) 20 MG tablet Take 20 mg by mouth daily.       Marland Kitchen PROAIR HFA 108 (90 BASE) MCG/ACT inhaler       . promethazine (PHENERGAN) 25 MG tablet       . methocarbamol (ROBAXIN) 500 MG tablet        No current facility-administered medications for this visit.   Past Medical History  Diagnosis Date  . Depression   . Osteoarthritis   . Fibromyalgia   . IBS (irritable bowel syndrome)   . GERD (gastroesophageal reflux disease)   . Prediabetes   . Chronic pain    Past Surgical History  Procedure Laterality Date  . Tonsillectomy    . Cholecystectomy    . Colonoscopy    . Upper gastrointestinal endoscopy    . Mandible surgery  09/06/1979   Allergies  Allergen Reactions  . Elavil (Amitriptyline) Other (See Comments)    Vivid dreams and almost suicidal  . Flagyl (Metronidazole) Itching  . Vistaril (Hydroxyzine Hcl) Other (See Comments)    Got higher than a kite on too much of this.  . Geodon (Ziprasidone Hydrochloride)   . Lactose Intolerance (Gi)  Objective:   Physical Exam Filed Vitals:   03/12/13 1608  BP: 132/64  Pulse: 84  Temp: 97.9 F (36.6 C)  Height: 5\' 6"  (1.676 m)  Weight: 151 lb 1.6 oz (68.539 kg)  Alert and oriented. Skin warm and dry. Oral mucosa is moist.   . Sclera anicteric, conjunctivae is pink. Thyroid not enlarged. No cervical lymphadenopathy. Lungs clear. Heart regular rate and rhythm.  Abdomen is soft. Bowel sounds are positive. No hepatomegaly. No abdominal masses felt. No tenderness.  No edema to lower extremities.       Assessment:    Hepatitis C. New treatements will be out in the next couple of months with a shorter duration of therapy.    Plan:    OV in October. Cmet, Hep C quaint,

## 2013-03-14 ENCOUNTER — Encounter (HOSPITAL_COMMUNITY): Payer: Self-pay | Admitting: Psychiatry

## 2013-03-14 ENCOUNTER — Ambulatory Visit (INDEPENDENT_AMBULATORY_CARE_PROVIDER_SITE_OTHER): Payer: Medicare Other | Admitting: Psychiatry

## 2013-03-14 VITALS — Wt 152.0 lb

## 2013-03-14 DIAGNOSIS — F329 Major depressive disorder, single episode, unspecified: Secondary | ICD-10-CM

## 2013-03-14 LAB — HEPATITIS C RNA QUANTITATIVE: HCV Quantitative: 6583601 IU/mL — ABNORMAL HIGH (ref <15)

## 2013-03-14 MED ORDER — GABAPENTIN 300 MG PO CAPS: 300.0000 mg | ORAL_CAPSULE | Freq: Three times a day (TID) | ORAL | Status: DC

## 2013-03-14 MED ORDER — DULOXETINE HCL 60 MG PO CPEP: 60.0000 mg | ORAL_CAPSULE | Freq: Every day | ORAL | Status: DC

## 2013-03-14 NOTE — Progress Notes (Signed)
[facility] [address] Progress Note Lauren [name] MRN: [id number] DOB: 26-Jul-1951 Age: 62 y.o.  Date: 03/14/2013  Chief Complaint: Chief Complaint  Patient presents with  . Follow-up  . Medication Refill   Subjective: I cannot tolerate gabapentin 4 times a day.  It is making me very dizzy.  History of presenting illness Patient is 62 year old Caucasian female who came for her followup appointment. Pt reports that she is unable to tolerate gabapentin 300 mg 4 times a day.  She's feeling very dizzy.  She admitted less pain but cannot due to the side effects.  She' compliant with Cymbalta.  She prefers to take Cymbalta 60 mg a day.  She denies any irritability agitation anger or mood swing.  She wants to work at least part-time .  She's no more taking trazodone and Effexor.  She's not drinking or using any illegal substance.  She still taking Ativan from her primary care physician.  Past psychiatric history Patient has a long history of depression. She has history of inpatient psychiatry treatment. She was last admitted in 2010 at old [facility] due to change mental status. She had tried multiple medication in the past including Lexapro Wellbutrin however she usually respond well on Cymbalta. Allergies: Allergies  Allergen Reactions  . Elavil (Amitriptyline) Other (See Comments)    Vivid dreams and almost suicidal  . Flagyl (Metronidazole) Itching  . Vistaril (Hydroxyzine Hcl) Other (See Comments)    Got higher than a kite on too much of this.  . Geodon (Ziprasidone Hydrochloride)   . Lactose Intolerance (Gi)   Medical History: Past Medical History  Diagnosis Date  . Depression   . Osteoarthritis   . Fibromyalgia   . IBS (irritable bowel syndrome)   . GERD (gastroesophageal reflux disease)   . Prediabetes   . Chronic pain   Surgical History: Past Surgical History  Procedure Laterality Date  . Tonsillectomy    . Cholecystectomy    . Colonoscopy    . Upper  gastrointestinal endoscopy    . Mandible surgery  09/06/1979  Family History family history includes Alcohol abuse in her cousin, father, maternal grandfather, maternal grandmother, paternal grandfather, paternal grandmother, and paternal uncle; Anxiety disorder in her maternal uncle and paternal uncle; Depression in her father; Diabetes in her brothers and father; Healthy in her brother; Heart disease in her brother; Irritable bowel syndrome in her mother; OCD in her brothers; Sexual abuse in her mother; and Stroke in her father and mother.  There is no history of ADD / ADHD, and Bipolar disorder, and Dementia, and Drug abuse, and Paranoid behavior, and Schizophrenia, and Seizures, and Physical abuse, . Reviewed al this again to day in the appointment and nothing has changed  Mental status examination Patient is fairly groomed and well dressed. She is emotional but cooperative.  Her speech is fast and pressured.  Her thought process is logical and goal-directed.  She described her mood is okay and her affect is mood appropriate.  She denies any active or passive suicidal thoughts or homicidal thoughts.  She still has some paranoia about people but she denies any hallucination. Her attention and concentration is fair. There were no obsession present at this time.  She's alert and oriented x3 and her insight judgment and impulse control is okay. Tearful about no body listening to her about her chemical effect on her brain.   Lab Results:  Results for orders placed in visit on 03/12/13 (from the past 8736 hour(s))  HEPATITIS  C RNA QUANTITATIVE   Collection Time    03/12/13  4:38 PM      Result Value Range   HCV Quantitative    <15 IU/mL   HCV Quantitative Log    <1.18 log 10  COMPREHENSIVE METABOLIC PANEL   Collection Time    03/12/13  4:38 PM      Result Value Range   Sodium 137  135 - 145 mEq/L   Potassium 4.3  3.5 - 5.3 mEq/L   Chloride 99  96 - 112 mEq/L   CO2 30  19 - 32 mEq/L   Glucose,  Bld 99  70 - 99 mg/dL   BUN 17  6 - 23 mg/dL   Creat 4.09  8.11 - 9.14 mg/dL   Total Bilirubin 0.5  0.3 - 1.2 mg/dL   Alkaline Phosphatase 70  39 - 117 U/L   AST 27  0 - 37 U/L   ALT 31  0 - 35 U/L   Total Protein 7.2  6.0 - 8.3 g/dL   Albumin 4.1  3.5 - 5.2 g/dL   Calcium 9.7  8.4 - 78.2 mg/dL  Family doctor is following her labs  Assessment Axis I Maj. depressive disorder, insomnia, pain issues, HIstory of alcoholism. Axis II deferred Axis III see medical history Axis IV mild to moderate  Plan: I recommend to try gabapentin 300 mg 3 times a day since it is helping her pain.  Recommend not to take fourth  unless needed.  I will switch Cymbalta 60 mg daily.  Explain risk and benefits of the medication and recommend to call us back if he is any question or concern.  Followup in 3 months.  MEDICATIONS this encounter: Meds ordered this encounter  Medications  . DULoxetine (CYMBALTA) 60 MG capsule    Sig: Take 1 capsule (60 mg total) by mouth daily.    Dispense:  30 capsule    Refill:  2    30 day supply  . gabapentin (NEURONTIN) 300 MG capsule    Sig: Take 1 capsule (300 mg total) by mouth 3 (three) times daily.    Dispense:  90 capsule    Refill:  2    Medical Decision Making Problem Points:  Established problem, worsening (2), Review of last therapy session (1) and Review of psycho-social stressors (1) Data Points:  Review or order clinical lab tests (1) Review of medication regiment & side effects (2) Review of new medications or change in dosage (2)  I certify that outpatient services furnished can reasonably be expected to improve the patient's condition.   ARFEEN,SYED T., MD

## 2013-03-18 ENCOUNTER — Ambulatory Visit (HOSPITAL_COMMUNITY): Payer: Self-pay | Admitting: Psychology

## 2013-03-22 ENCOUNTER — Ambulatory Visit (INDEPENDENT_AMBULATORY_CARE_PROVIDER_SITE_OTHER): Payer: Medicare Other | Admitting: Psychology

## 2013-03-22 DIAGNOSIS — F419 Anxiety disorder, unspecified: Secondary | ICD-10-CM

## 2013-03-22 DIAGNOSIS — F411 Generalized anxiety disorder: Secondary | ICD-10-CM

## 2013-03-22 DIAGNOSIS — F39 Unspecified mood [affective] disorder: Secondary | ICD-10-CM

## 2013-03-25 ENCOUNTER — Telehealth (INDEPENDENT_AMBULATORY_CARE_PROVIDER_SITE_OTHER): Payer: Self-pay | Admitting: Internal Medicine

## 2013-03-25 DIAGNOSIS — R768 Other specified abnormal immunological findings in serum: Secondary | ICD-10-CM

## 2013-03-25 NOTE — Telephone Encounter (Signed)
Results reviewed with patient. Needs US liver

## 2013-03-26 NOTE — Telephone Encounter (Signed)
[place] sch'd 03/29/13 at [phone]), NPO after midnight, left message on answering machine advising patient of appt info

## 2013-03-29 ENCOUNTER — Ambulatory Visit (HOSPITAL_COMMUNITY)
Admission: RE | Admit: 2013-03-29 | Discharge: 2013-03-29 | Disposition: A | Discharge status: Home / Self Care | Payer: Medicare Other | Source: Ambulatory Visit | Attending: Internal Medicine | Admitting: Internal Medicine

## 2013-03-29 DIAGNOSIS — R7689 Other specified abnormal immunological findings in serum: Secondary | ICD-10-CM

## 2013-03-29 DIAGNOSIS — R768 Other specified abnormal immunological findings in serum: Secondary | ICD-10-CM

## 2013-03-29 DIAGNOSIS — B1920 Unspecified viral hepatitis C without hepatic coma: Secondary | ICD-10-CM | POA: Insufficient documentation

## 2013-03-29 DIAGNOSIS — Q619 Cystic kidney disease, unspecified: Secondary | ICD-10-CM | POA: Insufficient documentation

## 2013-04-05 ENCOUNTER — Ambulatory Visit (INDEPENDENT_AMBULATORY_CARE_PROVIDER_SITE_OTHER): Payer: Medicare Other | Admitting: Psychology

## 2013-04-05 DIAGNOSIS — F411 Generalized anxiety disorder: Secondary | ICD-10-CM

## 2013-04-05 DIAGNOSIS — F39 Unspecified mood [affective] disorder: Secondary | ICD-10-CM

## 2013-04-05 DIAGNOSIS — F419 Anxiety disorder, unspecified: Secondary | ICD-10-CM

## 2013-04-26 ENCOUNTER — Ambulatory Visit (HOSPITAL_COMMUNITY): Payer: Self-pay | Admitting: Psychology

## 2013-05-01 ENCOUNTER — Emergency Department (HOSPITAL_COMMUNITY)
Admission: EM | Admit: 2013-05-01 | Discharge: 2013-05-01 | Disposition: A | Discharge status: Home / Self Care | Payer: Medicare Other | Attending: Emergency Medicine | Admitting: Emergency Medicine

## 2013-05-01 ENCOUNTER — Encounter (HOSPITAL_COMMUNITY): Payer: Self-pay | Admitting: Emergency Medicine

## 2013-05-01 DIAGNOSIS — F411 Generalized anxiety disorder: Secondary | ICD-10-CM | POA: Insufficient documentation

## 2013-05-01 DIAGNOSIS — G8929 Other chronic pain: Secondary | ICD-10-CM | POA: Insufficient documentation

## 2013-05-01 DIAGNOSIS — Z8739 Personal history of other diseases of the musculoskeletal system and connective tissue: Secondary | ICD-10-CM | POA: Insufficient documentation

## 2013-05-01 DIAGNOSIS — F329 Major depressive disorder, single episode, unspecified: Secondary | ICD-10-CM | POA: Insufficient documentation

## 2013-05-01 DIAGNOSIS — R21 Rash and other nonspecific skin eruption: Secondary | ICD-10-CM | POA: Insufficient documentation

## 2013-05-01 DIAGNOSIS — Z8719 Personal history of other diseases of the digestive system: Secondary | ICD-10-CM | POA: Insufficient documentation

## 2013-05-01 DIAGNOSIS — Z79899 Other long term (current) drug therapy: Secondary | ICD-10-CM | POA: Insufficient documentation

## 2013-05-01 DIAGNOSIS — F3289 Other specified depressive episodes: Secondary | ICD-10-CM | POA: Insufficient documentation

## 2013-05-01 DIAGNOSIS — M1990 Unspecified osteoarthritis, unspecified site: Secondary | ICD-10-CM | POA: Insufficient documentation

## 2013-05-01 DIAGNOSIS — Z87891 Personal history of nicotine dependence: Secondary | ICD-10-CM | POA: Insufficient documentation

## 2013-05-01 MED ORDER — METHYLPREDNISOLONE SODIUM SUCC 125 MG IJ SOLR
125.0000 mg | Freq: Once | INTRAMUSCULAR | Status: AC
  Administered 2013-05-01: 125 mg via INTRAMUSCULAR
  Filled 2013-05-01: qty 2

## 2013-05-01 MED ORDER — DIPHENHYDRAMINE HCL 25 MG PO TABS: 25.0000 mg | ORAL_TABLET | Freq: Four times a day (QID) | ORAL | Status: DC

## 2013-05-01 MED ORDER — PROMETHAZINE HCL 12.5 MG PO TABS
12.5000 mg | ORAL_TABLET | Freq: Once | ORAL | Status: AC
  Administered 2013-05-01: 12.5 mg via ORAL
  Filled 2013-05-01: qty 1

## 2013-05-01 MED ORDER — TRIAMCINOLONE ACETONIDE 0.1 % EX CREA: TOPICAL_CREAM | Freq: Two times a day (BID) | CUTANEOUS | Status: DC

## 2013-05-01 MED ORDER — DIPHENHYDRAMINE HCL 25 MG PO CAPS
25.0000 mg | ORAL_CAPSULE | Freq: Once | ORAL | Status: AC
  Administered 2013-05-01: 25 mg via ORAL
  Filled 2013-05-01: qty 1

## 2013-05-01 MED ORDER — PREDNISONE 10 MG PO TABS: ORAL_TABLET | ORAL | Status: DC

## 2013-05-01 NOTE — ED Notes (Signed)
Pt c/o rash to neck over a month, but has gotten worse of last couple of days.

## 2013-05-01 NOTE — ED Provider Notes (Signed)
Medical screening examination/treatment/procedure(s) were performed by non-physician practitioner and as supervising physician I was immediately available for consultation/collaboration.  Juliet Rude. Rubin Payor, MD 05/01/13 [phone]

## 2013-05-01 NOTE — ED Notes (Signed)
Pt has red itching rash to ant neck for several weeks, Has appt with dermatologist next week, came to ER due to discomfort.

## 2013-05-01 NOTE — ED Provider Notes (Signed)
CSN: [id number]     Arrival date & time 05/01/13  1835 History   First MD Initiated Contact with Patient 05/01/13 2021     Chief Complaint  Patient presents with  . Rash   (Consider location/radiation/quality/duration/timing/severity/associated sxs/prior Treatment) Patient is a 62 y.o. female presenting with rash. The history is provided by the patient.  Rash Location:  Head/neck Head/neck rash location:  L neck and R neck Quality: burning, dryness, itchiness and peeling   Quality: not blistering   Severity:  Moderate Onset quality:  Gradual Duration:  1 month Timing:  Intermittent Progression:  Worsening Chronicity:  New Context: not exposure to similar rash, not food, not medications, not new detergent/soap and not plant contact   Relieved by:  Nothing Worsened by:  Nothing tried Associated symptoms: no abdominal pain, no fever, no headaches, no joint pain, no shortness of breath, no throat swelling, no tongue swelling, not vomiting and not wheezing     Past Medical History  Diagnosis Date  . Depression   . Osteoarthritis   . Fibromyalgia   . IBS (irritable bowel syndrome)   . GERD (gastroesophageal reflux disease)   . Prediabetes   . Chronic pain    Past Surgical History  Procedure Laterality Date  . Tonsillectomy    . Cholecystectomy    . Colonoscopy    . Upper gastrointestinal endoscopy    . Mandible surgery  09/06/1979   Family History  Problem Relation Age of Onset  . Alcohol abuse Father   . Diabetes Father   . Stroke Father   . Depression Father   . Anxiety disorder Paternal Uncle   . Alcohol abuse Paternal Uncle   . Alcohol abuse Maternal Grandfather   . Alcohol abuse Maternal Grandmother   . Alcohol abuse Paternal Grandfather   . Alcohol abuse Paternal Grandmother   . Alcohol abuse Cousin   . Anxiety disorder Maternal Uncle   . Stroke Mother   . Irritable bowel syndrome Mother   . Sexual abuse Mother   . Diabetes Brother   . OCD Brother   .  Healthy Brother   . OCD Brother   . Diabetes Brother   . Heart disease Brother   . ADD / ADHD Neg Hx   . Bipolar disorder Neg Hx   . Dementia Neg Hx   . Drug abuse Neg Hx   . Paranoid behavior Neg Hx   . Schizophrenia Neg Hx   . Seizures Neg Hx   . Physical abuse Neg Hx    History  Substance Use Topics  . Smoking status: Former Smoker -- 2.00 packs/day    Quit date: 09/06/1983  . Smokeless tobacco: Never Used  . Alcohol Use: No   OB History   Grav Para Term Preterm Abortions TAB SAB Ect Mult Living                 Review of Systems  Constitutional: Negative for fever and activity change.       All ROS Neg except as noted in HPI  HENT: Negative for nosebleeds and neck pain.   Eyes: Negative for photophobia and discharge.  Respiratory: Negative for cough, shortness of breath and wheezing.   Cardiovascular: Negative for chest pain and palpitations.  Gastrointestinal: Negative for vomiting, abdominal pain and blood in stool.  Genitourinary: Negative for dysuria, frequency and hematuria.  Musculoskeletal: Negative for back pain and arthralgias.  Skin: Positive for rash.  Neurological: Negative for dizziness, seizures, speech difficulty and headaches.  Psychiatric/Behavioral: Negative for hallucinations and confusion.    Allergies  Elavil; Flagyl; Vistaril; Geodon; and Lactose intolerance (gi)  Home Medications   Current Outpatient Rx  Name  Route  Sig  Dispense  Refill  . calcium-vitamin D (OSCAL WITH D) 500-200 MG-UNIT per tablet   Oral   Take 1 tablet by mouth 2 (two) times daily.         . diphenhydrAMINE (BENADRYL) 25 MG tablet   Oral   Take 1 tablet (25 mg total) by mouth every 6 (six) hours.   20 tablet   0   . diphenoxylate-atropine (LOMOTIL) 2.5-0.025 MG per tablet   Oral   Take 1 tablet by mouth as needed.         . DULoxetine (CYMBALTA) 60 MG capsule   Oral   Take 1 capsule (60 mg total) by mouth daily.   30 capsule   2     30 day supply    . gabapentin (NEURONTIN) 300 MG capsule   Oral   Take 1 capsule (300 mg total) by mouth 3 (three) times daily.   90 capsule   2   . HYDROcodone-acetaminophen (NORCO) 10-325 MG per tablet      1 tablet as needed.          . lidocaine (LIDODERM) 5 %   Transdermal   Place 1 patch onto the skin every 12 (twelve) hours. Remove & Discard patch within 12 hours or as directed by MD   90 patch   1     90 day supply   . LORazepam (ATIVAN) 1 MG tablet   Oral   Take 1 mg by mouth at bedtime as needed and may repeat dose one time if needed for anxiety.         . meloxicam (MOBIC) 7.5 MG tablet   Oral   Take 7.5 mg by mouth daily.         . metformin (FORTAMET) 500 MG (OSM) 24 hr tablet   Oral   Take 500 mg by mouth daily with breakfast.         . methocarbamol (ROBAXIN) 500 MG tablet               . omeprazole (PRILOSEC) 20 MG capsule   Oral   Take 1 capsule (20 mg total) by mouth daily.   30 capsule   2   . oxyCODONE-acetaminophen (PERCOCET) 10-325 MG per tablet               . pravastatin (PRAVACHOL) 20 MG tablet   Oral   Take 20 mg by mouth daily.          . predniSONE (DELTASONE) 10 MG tablet      5,4,3,2,1 - take with food   15 tablet   0   . PROAIR HFA 108 (90 BASE) MCG/ACT inhaler               . promethazine (PHENERGAN) 25 MG tablet               . triamcinolone cream (KENALOG) 0.1 %   Topical   Apply topically 2 (two) times daily.   30 g   0    BP 146/83  Pulse 84  Temp(Src) 98.2 F (36.8 C) (Oral)  Resp 20  Ht 5\' 6"  (1.676 m)  Wt 155 lb (70.308 kg)  BMI 25.03 kg/m2  SpO2 95% Physical Exam  Nursing note and vitals reviewed. Constitutional: She is oriented  to person, place, and time. She appears well-developed and well-nourished.  Non-toxic appearance.  HENT:  Head: Normocephalic.  Right Ear: Tympanic membrane and external ear normal.  Left Ear: Tympanic membrane and external ear normal.  Mouth/Throat: Oropharynx is  clear and moist.  Eyes: EOM and lids are normal. Pupils are equal, round, and reactive to light.  Neck: Normal range of motion. Neck supple. Carotid bruit is not present.  Dry scaling peeling red rash of the right and left and anterior neck.  Cardiovascular: Normal rate, regular rhythm, normal heart sounds, intact distal pulses and normal pulses.   Pulmonary/Chest: Breath sounds normal. No respiratory distress.  Abdominal: Soft. Bowel sounds are normal. There is no tenderness. There is no guarding.  Musculoskeletal: Normal range of motion.  Lymphadenopathy:       Head (right side): No submandibular adenopathy present.       Head (left side): No submandibular adenopathy present.    She has no cervical adenopathy.  Neurological: She is alert and oriented to person, place, and time. She has normal strength. No cranial nerve deficit or sensory deficit.  Skin: Skin is warm and dry.  Psychiatric: Her speech is normal. Her mood appears anxious.    ED Course  Procedures (including critical care time) Labs Review Labs Reviewed - No data to display Imaging Review No results found.  MDM   1. Rash of neck    *I have reviewed nursing notes, vital signs, and all appropriate lab and imaging results for this patient.**  Pt denies any new jewry, soap, clothes, or colones applied to the neck. She is scheduled to see a dermatologist next week. Rx for prednisone, triamcinolone, and benadryl given to the patient.  Kathie Dike, PA-C 05/01/13 2134

## 2013-05-15 ENCOUNTER — Encounter (HOSPITAL_COMMUNITY): Payer: Self-pay | Admitting: Psychology

## 2013-05-15 NOTE — Progress Notes (Signed)
Patient:  Lauren [name]   DOB: Jan 10, 1951  MR Number: [id number]  Location: BEHAVIORAL [facility] [address] [place] [address] Dept: [phone]  Start: 4 PM End: 5 PM  Provider/Observer:     Hershal Coria PSYD  Chief Complaint:      Chief Complaint  Patient presents with  . Anxiety  . Agitation  . Depression  . Stress  . Trauma    Reason For Service:     The patient has a long history of major depressive condition and ongoing underlying depressive disorder. She is a recovering alcoholic but has many many years this Friday. Significant symptoms of anxiety have played a major role in substance abuse in the past. Significant medical problems and her inability to work have complicated her situation. When all these difficulties are put in the context and relationship to potential workability it is clear that she is disabled not do not expect her to return to gainful employment.  Interventions Strategy:  Cognitive/behavioral psychotherapy  Participation Level:   Active  Participation Quality:  Appropriate      Behavioral Observation:  Well Groomed, Alert, and Appropriate.   Current Psychosocial Factors: The patient has continued to work but is struggling with the effects it is having on her fibromyalgia. However, she is continuing to be able to do limited work and is feeling good about that. However, there continues to be struggles between her and her husband.   Content of Session:   Review current symptoms and continue to work on therapeutic interventions for improved coping skills and strategies.  Current Status:   The patient is continued to work on reducing benzodiazepine use and reports that this is very difficult sometimes.  Patient Progress:   Stable and improving  Target Goals:   Reduce the frequency of depressive events including feelings of hopelessness and helplessness, anhedonia,  social withdrawal, as well as significant anxiety symptoms related to social avoidance, fear of abandonment and fear of inability to manage and cope with her world. We will also look at continuing complete sobriety.  Last Reviewed:   03/22/2013   Goals Addressed Today:    Target goals addressed today have to do with issues of depression in particular feelings of helplessness and hopelessness.   Impression/Diagnosis:   The patient has a long history of depressive disorder and underlying anxiety disorder. She has had numerous medical issues including severe fibromyalgia and severe arthritis. She has been diagnosed with significant arthritis throughout her body and in particular her hands and her feet. Numerous events and left are overwhelmed and unable to work. The patient has had periodic times of improvement for primary symptoms but there are other times where the symptoms are so problematic that she has not been able to maintain gainful employment.  Diagnosis:    Axis I:  Mood disorder  Anxiety      Axis II: No diagnosis

## 2013-05-20 ENCOUNTER — Telehealth (HOSPITAL_COMMUNITY): Payer: Self-pay | Admitting: *Deleted

## 2013-05-22 ENCOUNTER — Ambulatory Visit (INDEPENDENT_AMBULATORY_CARE_PROVIDER_SITE_OTHER): Payer: Medicare Other | Admitting: Psychology

## 2013-05-22 DIAGNOSIS — F419 Anxiety disorder, unspecified: Secondary | ICD-10-CM

## 2013-05-22 DIAGNOSIS — F411 Generalized anxiety disorder: Secondary | ICD-10-CM

## 2013-05-22 DIAGNOSIS — F39 Unspecified mood [affective] disorder: Secondary | ICD-10-CM

## 2013-05-23 ENCOUNTER — Encounter (HOSPITAL_COMMUNITY): Payer: Self-pay | Admitting: Psychology
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
## 2013-05-28 ENCOUNTER — Other Ambulatory Visit (HOSPITAL_COMMUNITY): Payer: Self-pay | Admitting: Family Medicine

## 2013-05-28 DIAGNOSIS — M810 Age-related osteoporosis without current pathological fracture: Secondary | ICD-10-CM

## 2013-05-31 ENCOUNTER — Other Ambulatory Visit (HOSPITAL_COMMUNITY): Payer: Self-pay

## 2013-06-03 ENCOUNTER — Encounter (HOSPITAL_COMMUNITY): Payer: Self-pay | Admitting: Psychology

## 2013-06-03 NOTE — Progress Notes (Signed)
Patient:  Lauren [name]   DOB: [birth year]  MR Number: [id number]  Location: BEHAVIORAL [facility] [address] [place] [address] Dept: [phone]  Start: 3 PM End: 4 PM  Provider/Observer:     Hershal Coria PSYD  Chief Complaint:      Chief Complaint  Patient presents with  . Agitation  . Anxiety  . Stress  . Depression    Reason For Service:     The patient has a long history of major depressive condition and ongoing underlying depressive disorder. She is a recovering alcoholic but has many many years this Friday. Significant symptoms of anxiety have played a major role in substance abuse in the past. Significant medical problems and her inability to work have complicated her situation. When all these difficulties are put in the context and relationship to potential workability it is clear that she is disabled not do not expect her to return to gainful employment.  Interventions Strategy:  Cognitive/behavioral psychotherapy  Participation Level:   Active  Participation Quality:  Appropriate      Behavioral Observation:  Well Groomed, Alert, and Appropriate.   Current Psychosocial Factors: The patient reports that she has been doing some work on a very limited basis and while she feels that this is very good for her at she is often overwhelmed and experiences significant problems and after she works. She is limited in this to a significant degree.   Content of Session:   Review current symptoms and continue to work on therapeutic interventions for improved coping skills and strategies.  Current Status:   The patient is continued to work on reducing benzodiazepine use and reports that this is very difficult sometimes.  Patient Progress:   Stable and improving  Target Goals:   Reduce the frequency of depressive events including feelings of hopelessness and helplessness, anhedonia, social  withdrawal, as well as significant anxiety symptoms related to social avoidance, fear of abandonment and fear of inability to manage and cope with her world. We will also look at continuing complete sobriety.  Last Reviewed:   04/05/2013   Goals Addressed Today:    Target goals addressed today have to do with issues of depression in particular feelings of helplessness and hopelessness.   Impression/Diagnosis:   The patient has a long history of depressive disorder and underlying anxiety disorder. She has had numerous medical issues including severe fibromyalgia and severe arthritis. She has been diagnosed with significant arthritis throughout her body and in particular her hands and her feet. Numerous events and left are overwhelmed and unable to work. The patient has had periodic times of improvement for primary symptoms but there are other times where the symptoms are so problematic that she has not been able to maintain gainful employment.  Diagnosis:    Axis I:  Mood disorder  Anxiety      Axis II: No diagnosis

## 2013-06-05 ENCOUNTER — Ambulatory Visit (HOSPITAL_COMMUNITY): Payer: Self-pay | Admitting: Psychology

## 2013-06-11 ENCOUNTER — Ambulatory Visit (INDEPENDENT_AMBULATORY_CARE_PROVIDER_SITE_OTHER): Payer: Self-pay | Admitting: Internal Medicine

## 2013-06-13 ENCOUNTER — Ambulatory Visit (HOSPITAL_COMMUNITY): Payer: Self-pay | Admitting: Psychiatry

## 2013-06-13 ENCOUNTER — Ambulatory Visit (INDEPENDENT_AMBULATORY_CARE_PROVIDER_SITE_OTHER): Payer: Medicare Other | Admitting: Psychiatry

## 2013-06-13 ENCOUNTER — Encounter (HOSPITAL_COMMUNITY): Payer: Self-pay | Admitting: Psychiatry

## 2013-06-13 VITALS — BP 140/82 | Ht 66.0 in | Wt 156.0 lb

## 2013-06-13 DIAGNOSIS — F39 Unspecified mood [affective] disorder: Secondary | ICD-10-CM

## 2013-06-13 DIAGNOSIS — G4700 Insomnia, unspecified: Secondary | ICD-10-CM

## 2013-06-13 DIAGNOSIS — F329 Major depressive disorder, single episode, unspecified: Secondary | ICD-10-CM

## 2013-06-13 DIAGNOSIS — R52 Pain, unspecified: Secondary | ICD-10-CM

## 2013-06-13 MED ORDER — LORAZEPAM 1 MG PO TABS: 1.0000 mg | ORAL_TABLET | Freq: Every evening | ORAL | Status: DC | PRN

## 2013-06-13 MED ORDER — DULOXETINE HCL 60 MG PO CPEP: 60.0000 mg | ORAL_CAPSULE | Freq: Every day | ORAL | Status: DC

## 2013-06-13 MED ORDER — GABAPENTIN 300 MG PO CAPS: 300.0000 mg | ORAL_CAPSULE | Freq: Three times a day (TID) | ORAL | Status: DC
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
## 2013-06-28 ENCOUNTER — Ambulatory Visit (HOSPITAL_COMMUNITY): Payer: Self-pay | Admitting: Psychology

## 2013-07-11 ENCOUNTER — Other Ambulatory Visit: Payer: Self-pay

## 2013-07-23 ENCOUNTER — Observation Stay (HOSPITAL_COMMUNITY): Payer: Medicare Other | Admitting: Anesthesiology

## 2013-07-23 ENCOUNTER — Encounter (HOSPITAL_COMMUNITY): Payer: Self-pay | Admitting: Emergency Medicine

## 2013-07-23 ENCOUNTER — Emergency Department (HOSPITAL_COMMUNITY): Payer: Medicare Other

## 2013-07-23 ENCOUNTER — Encounter (HOSPITAL_COMMUNITY): Payer: Medicare Other | Admitting: Anesthesiology

## 2013-07-23 ENCOUNTER — Encounter (HOSPITAL_COMMUNITY)
Admission: EM | Disposition: A | Discharge status: Home Health | Payer: Self-pay | Source: Home / Self Care | Attending: Orthopedic Surgery

## 2013-07-23 ENCOUNTER — Inpatient Hospital Stay (HOSPITAL_COMMUNITY)
Admission: EM | Admit: 2013-07-23 | Discharge: 2013-07-26 | DRG: 511 | Disposition: A | Discharge status: Home Health | Payer: Medicare Other | Attending: Orthopedic Surgery | Admitting: Orthopedic Surgery

## 2013-07-23 DIAGNOSIS — F329 Major depressive disorder, single episode, unspecified: Secondary | ICD-10-CM | POA: Diagnosis present

## 2013-07-23 DIAGNOSIS — S63599A Other specified sprain of unspecified wrist, initial encounter: Secondary | ICD-10-CM | POA: Diagnosis present

## 2013-07-23 DIAGNOSIS — S22000B Wedge compression fracture of unspecified thoracic vertebra, initial encounter for open fracture: Secondary | ICD-10-CM

## 2013-07-23 DIAGNOSIS — S5291XA Unspecified fracture of right forearm, initial encounter for closed fracture: Secondary | ICD-10-CM

## 2013-07-23 DIAGNOSIS — Z87891 Personal history of nicotine dependence: Secondary | ICD-10-CM

## 2013-07-23 DIAGNOSIS — S62121A Displaced fracture of lunate [semilunar], right wrist, initial encounter for closed fracture: Secondary | ICD-10-CM

## 2013-07-23 DIAGNOSIS — K589 Irritable bowel syndrome without diarrhea: Secondary | ICD-10-CM | POA: Diagnosis present

## 2013-07-23 DIAGNOSIS — S22009A Unspecified fracture of unspecified thoracic vertebra, initial encounter for closed fracture: Secondary | ICD-10-CM | POA: Diagnosis present

## 2013-07-23 DIAGNOSIS — S52202A Unspecified fracture of shaft of left ulna, initial encounter for closed fracture: Secondary | ICD-10-CM

## 2013-07-23 DIAGNOSIS — IMO0001 Reserved for inherently not codable concepts without codable children: Secondary | ICD-10-CM | POA: Diagnosis present

## 2013-07-23 DIAGNOSIS — Z96659 Presence of unspecified artificial knee joint: Secondary | ICD-10-CM

## 2013-07-23 DIAGNOSIS — E871 Hypo-osmolality and hyponatremia: Secondary | ICD-10-CM | POA: Diagnosis not present

## 2013-07-23 DIAGNOSIS — K219 Gastro-esophageal reflux disease without esophagitis: Secondary | ICD-10-CM | POA: Diagnosis present

## 2013-07-23 DIAGNOSIS — S62123A Displaced fracture of lunate [semilunar], unspecified wrist, initial encounter for closed fracture: Secondary | ICD-10-CM | POA: Diagnosis present

## 2013-07-23 DIAGNOSIS — F3289 Other specified depressive episodes: Secondary | ICD-10-CM | POA: Diagnosis present

## 2013-07-23 DIAGNOSIS — S8000XA Contusion of unspecified knee, initial encounter: Secondary | ICD-10-CM | POA: Diagnosis present

## 2013-07-23 DIAGNOSIS — S52599A Other fractures of lower end of unspecified radius, initial encounter for closed fracture: Principal | ICD-10-CM | POA: Diagnosis present

## 2013-07-23 DIAGNOSIS — M1990 Unspecified osteoarthritis, unspecified site: Secondary | ICD-10-CM | POA: Diagnosis present

## 2013-07-23 HISTORY — PX: ORIF WRIST FRACTURE: SHX2133

## 2013-07-23 LAB — CBC WITH DIFFERENTIAL/PLATELET
Basophils Absolute: 0 10*3/uL (ref 0.0–0.1)
Basophils Relative: 0 % (ref 0–1)
Eosinophils Absolute: 0.2 10*3/uL (ref 0.0–0.7)
HCT: 40.9 % (ref 36.0–46.0)
Lymphs Abs: 1.3 10*3/uL (ref 0.7–4.0)
MCH: 26.8 pg (ref 26.0–34.0)
MCHC: 31.8 g/dL (ref 30.0–36.0)
Monocytes Absolute: 0.6 10*3/uL (ref 0.1–1.0)
Neutrophils Relative %: 76 % (ref 43–77)
Platelets: 205 10*3/uL (ref 150–400)
RBC: 4.85 MIL/uL (ref 3.87–5.11)
RDW: 13.6 % (ref 11.5–15.5)

## 2013-07-23 LAB — POCT I-STAT, CHEM 8
Calcium, Ion: 1.26 mmol/L (ref 1.13–1.30)
Chloride: 101 mEq/L (ref 96–112)
Glucose, Bld: 123 mg/dL — ABNORMAL HIGH (ref 70–99)
HCT: 43 % (ref 36.0–46.0)
Hemoglobin: 14.6 g/dL (ref 12.0–15.0)

## 2013-07-23 LAB — BASIC METABOLIC PANEL
CO2: 32 mEq/L (ref 19–32)
GFR calc Af Amer: >90 mL/min (ref >90)
GFR calc non Af Amer: >90 mL/min (ref >90)
Potassium: 4.2 mEq/L (ref 3.5–5.1)
Sodium: 140 mEq/L (ref 135–145)

## 2013-07-23 SURGERY — OPEN REDUCTION INTERNAL FIXATION (ORIF) WRIST FRACTURE
Anesthesia: General | Site: Wrist | Laterality: Right | Wound class: Clean

## 2013-07-23 MED ORDER — LACTATED RINGERS IV SOLN
INTRAVENOUS | Status: DC | PRN
  Administered 2013-07-23 (×2): via INTRAVENOUS

## 2013-07-23 MED ORDER — OXYCODONE HCL 5 MG/5ML PO SOLN: 5.0000 mg | Freq: Once | ORAL | Status: DC | PRN

## 2013-07-23 MED ORDER — ALPRAZOLAM 0.5 MG PO TABS: 0.5000 mg | ORAL_TABLET | Freq: Four times a day (QID) | ORAL | Status: DC | PRN

## 2013-07-23 MED ORDER — ALBUTEROL SULFATE HFA 108 (90 BASE) MCG/ACT IN AERS
2.0000 | INHALATION_SPRAY | RESPIRATORY_TRACT | Status: DC | PRN
Start: 2013-07-23 — End: 2013-07-26
  Filled 2013-07-23: qty 6.7

## 2013-07-23 MED ORDER — ONDANSETRON HCL 4 MG/2ML IJ SOLN
INTRAMUSCULAR | Status: DC | PRN
  Administered 2013-07-23: 4 mg via INTRAVENOUS

## 2013-07-23 MED ORDER — LACTATED RINGERS IV SOLN
INTRAVENOUS | Status: DC
  Administered 2013-07-24 – 2013-07-25 (×3): via INTRAVENOUS

## 2013-07-23 MED ORDER — PHENYLEPHRINE HCL 10 MG/ML IJ SOLN
INTRAMUSCULAR | Status: DC | PRN
  Administered 2013-07-23: 80 ug via INTRAVENOUS
  Administered 2013-07-23: 40 ug via INTRAVENOUS
  Administered 2013-07-23: 80 ug via INTRAVENOUS
  Administered 2013-07-23: 40 ug via INTRAVENOUS

## 2013-07-23 MED ORDER — HYDROMORPHONE HCL PF 1 MG/ML IJ SOLN
1.0000 mg | Freq: Once | INTRAMUSCULAR | Status: AC
  Administered 2013-07-23: 1 mg via INTRAVENOUS

## 2013-07-23 MED ORDER — METOCLOPRAMIDE HCL 5 MG/ML IJ SOLN: 10.0000 mg | Freq: Once | INTRAMUSCULAR | Status: DC | PRN

## 2013-07-23 MED ORDER — ONDANSETRON HCL 4 MG/2ML IJ SOLN: 4.0000 mg | Freq: Three times a day (TID) | INTRAMUSCULAR | Status: AC | PRN

## 2013-07-23 MED ORDER — 0.9 % SODIUM CHLORIDE (POUR BTL) OPTIME
TOPICAL | Status: DC | PRN
  Administered 2013-07-23: 1000 mL

## 2013-07-23 MED ORDER — HYDROMORPHONE HCL PF 1 MG/ML IJ SOLN
0.2500 mg | INTRAMUSCULAR | Status: DC | PRN
  Administered 2013-07-23 (×2): 0.5 mg via INTRAVENOUS

## 2013-07-23 MED ORDER — ROCURONIUM BROMIDE 100 MG/10ML IV SOLN
INTRAVENOUS | Status: DC | PRN
  Administered 2013-07-23: 30 mg via INTRAVENOUS

## 2013-07-23 MED ORDER — IOHEXOL 300 MG/ML  SOLN
80.0000 mL | Freq: Once | INTRAMUSCULAR | Status: AC | PRN
  Administered 2013-07-23: 80 mL via INTRAVENOUS

## 2013-07-23 MED ORDER — PANTOPRAZOLE SODIUM 40 MG PO TBEC: 40.0000 mg | DELAYED_RELEASE_TABLET | Freq: Two times a day (BID) | ORAL | Status: DC | PRN

## 2013-07-23 MED ORDER — LIDOCAINE HCL (CARDIAC) 20 MG/ML IV SOLN
INTRAVENOUS | Status: DC | PRN
  Administered 2013-07-23: 50 mg via INTRAVENOUS

## 2013-07-23 MED ORDER — ONDANSETRON HCL 4 MG PO TABS
4.0000 mg | ORAL_TABLET | Freq: Four times a day (QID) | ORAL | Status: DC | PRN
  Administered 2013-07-24 – 2013-07-25 (×2): 4 mg via ORAL
  Filled 2013-07-23 (×2): qty 1

## 2013-07-23 MED ORDER — ONDANSETRON HCL 4 MG/2ML IJ SOLN: 4.0000 mg | Freq: Once | INTRAMUSCULAR | Status: DC

## 2013-07-23 MED ORDER — FENTANYL CITRATE 0.05 MG/ML IJ SOLN
INTRAMUSCULAR | Status: DC | PRN
  Administered 2013-07-23: 50 ug via INTRAVENOUS
  Administered 2013-07-23: 100 ug via INTRAVENOUS
  Administered 2013-07-23 (×2): 50 ug via INTRAVENOUS

## 2013-07-23 MED ORDER — ONDANSETRON HCL 4 MG/2ML IJ SOLN: 4.0000 mg | Freq: Four times a day (QID) | INTRAMUSCULAR | Status: DC | PRN

## 2013-07-23 MED ORDER — SODIUM CHLORIDE 0.9 % IJ SOLN
INTRAMUSCULAR | Status: AC
  Filled 2013-07-23: qty 500

## 2013-07-23 MED ORDER — DIPHENHYDRAMINE HCL 25 MG PO CAPS: 25.0000 mg | ORAL_CAPSULE | Freq: Four times a day (QID) | ORAL | Status: DC | PRN

## 2013-07-23 MED ORDER — LORAZEPAM 1 MG PO TABS
1.0000 mg | ORAL_TABLET | Freq: Every evening | ORAL | Status: DC | PRN
  Administered 2013-07-24 – 2013-07-25 (×3): 1 mg via ORAL
  Filled 2013-07-23 (×3): qty 1

## 2013-07-23 MED ORDER — MIDAZOLAM HCL 5 MG/5ML IJ SOLN
INTRAMUSCULAR | Status: DC | PRN
  Administered 2013-07-23: 2 mg via INTRAVENOUS

## 2013-07-23 MED ORDER — METHOCARBAMOL 100 MG/ML IJ SOLN
500.0000 mg | Freq: Four times a day (QID) | INTRAMUSCULAR | Status: DC | PRN
  Filled 2013-07-23: qty 5

## 2013-07-23 MED ORDER — HYDROMORPHONE HCL PF 1 MG/ML IJ SOLN
INTRAMUSCULAR | Status: AC
  Filled 2013-07-23: qty 1

## 2013-07-23 MED ORDER — METHOCARBAMOL 500 MG PO TABS
ORAL_TABLET | ORAL | Status: AC
  Administered 2013-07-23: 500 mg via ORAL
  Filled 2013-07-23: qty 1

## 2013-07-23 MED ORDER — EPHEDRINE SULFATE 50 MG/ML IJ SOLN
INTRAMUSCULAR | Status: DC | PRN
  Administered 2013-07-23 (×2): 10 mg via INTRAVENOUS

## 2013-07-23 MED ORDER — HYDROMORPHONE HCL PF 1 MG/ML IJ SOLN
1.0000 mg | Freq: Once | INTRAMUSCULAR | Status: AC
  Administered 2013-07-23: 1 mg via INTRAVENOUS
  Filled 2013-07-23: qty 1

## 2013-07-23 MED ORDER — ONDANSETRON HCL 4 MG/2ML IJ SOLN
4.0000 mg | Freq: Once | INTRAMUSCULAR | Status: AC
  Administered 2013-07-23: 4 mg via INTRAVENOUS

## 2013-07-23 MED ORDER — ADULT MULTIVITAMIN W/MINERALS CH
1.0000 | ORAL_TABLET | Freq: Every day | ORAL | Status: DC
  Administered 2013-07-25 – 2013-07-26 (×2): 1 via ORAL
  Filled 2013-07-23 (×3): qty 1

## 2013-07-23 MED ORDER — OXYCODONE HCL 5 MG PO TABS
5.0000 mg | ORAL_TABLET | ORAL | Status: DC | PRN
  Administered 2013-07-23 – 2013-07-26 (×15): 10 mg via ORAL
  Filled 2013-07-23 (×15): qty 2

## 2013-07-23 MED ORDER — SIMVASTATIN 10 MG PO TABS
10.0000 mg | ORAL_TABLET | Freq: Every day | ORAL | Status: DC
  Administered 2013-07-24 – 2013-07-25 (×2): 10 mg via ORAL
  Filled 2013-07-23 (×3): qty 1

## 2013-07-23 MED ORDER — CEFAZOLIN SODIUM 1-5 GM-% IV SOLN
1.0000 g | INTRAVENOUS | Status: AC
  Administered 2013-07-24: 1 g via INTRAVENOUS
  Filled 2013-07-23: qty 50

## 2013-07-23 MED ORDER — NEOSTIGMINE METHYLSULFATE 1 MG/ML IJ SOLN
INTRAMUSCULAR | Status: DC | PRN
  Administered 2013-07-23: 3.5 mg via INTRAVENOUS

## 2013-07-23 MED ORDER — OXYCODONE HCL 5 MG PO TABS: 5.0000 mg | ORAL_TABLET | Freq: Once | ORAL | Status: DC | PRN

## 2013-07-23 MED ORDER — LIDOCAINE 5 % EX PTCH
1.0000 | MEDICATED_PATCH | Freq: Two times a day (BID) | CUTANEOUS | Status: DC
  Administered 2013-07-24 – 2013-07-26 (×5): 1 via TRANSDERMAL
  Filled 2013-07-23 (×6): qty 1

## 2013-07-23 MED ORDER — CEFAZOLIN SODIUM-DEXTROSE 2-3 GM-% IV SOLR
INTRAVENOUS | Status: DC | PRN
  Administered 2013-07-23: 2 g via INTRAVENOUS

## 2013-07-23 MED ORDER — HYDROMORPHONE HCL PF 1 MG/ML IJ SOLN
1.0000 mg | INTRAMUSCULAR | Status: AC | PRN
  Administered 2013-07-24 (×2): 1 mg via INTRAVENOUS
  Filled 2013-07-23 (×2): qty 1

## 2013-07-23 MED ORDER — ONDANSETRON HCL 4 MG/2ML IJ SOLN
4.0000 mg | Freq: Once | INTRAMUSCULAR | Status: DC
  Filled 2013-07-23: qty 2

## 2013-07-23 MED ORDER — PROPOFOL 10 MG/ML IV BOLUS
INTRAVENOUS | Status: DC | PRN
  Administered 2013-07-23: 150 mg via INTRAVENOUS

## 2013-07-23 MED ORDER — GABAPENTIN 300 MG PO CAPS
300.0000 mg | ORAL_CAPSULE | Freq: Three times a day (TID) | ORAL | Status: DC
  Administered 2013-07-24 – 2013-07-26 (×9): 300 mg via ORAL
  Filled 2013-07-23 (×10): qty 1

## 2013-07-23 MED ORDER — SENNA 8.6 MG PO TABS
1.0000 | ORAL_TABLET | Freq: Two times a day (BID) | ORAL | Status: DC
  Administered 2013-07-24 – 2013-07-26 (×4): 8.6 mg via ORAL
  Filled 2013-07-23 (×7): qty 1

## 2013-07-23 MED ORDER — METHOCARBAMOL 500 MG PO TABS
500.0000 mg | ORAL_TABLET | Freq: Four times a day (QID) | ORAL | Status: DC | PRN
  Administered 2013-07-23 – 2013-07-26 (×5): 500 mg via ORAL
  Filled 2013-07-23 (×7): qty 1

## 2013-07-23 MED ORDER — MORPHINE SULFATE 2 MG/ML IJ SOLN
1.0000 mg | INTRAMUSCULAR | Status: DC | PRN
  Administered 2013-07-25: 1 mg via INTRAVENOUS
  Filled 2013-07-23: qty 1

## 2013-07-23 MED ORDER — OXYCODONE HCL 5 MG PO TABS
ORAL_TABLET | ORAL | Status: AC
  Administered 2013-07-23: 10 mg via ORAL
  Filled 2013-07-23: qty 2

## 2013-07-23 MED ORDER — MORPHINE SULFATE 4 MG/ML IJ SOLN
4.0000 mg | Freq: Once | INTRAMUSCULAR | Status: AC
  Administered 2013-07-23: 4 mg via INTRAVENOUS
  Filled 2013-07-23: qty 1

## 2013-07-23 MED ORDER — DULOXETINE HCL 60 MG PO CPEP
60.0000 mg | ORAL_CAPSULE | Freq: Every day | ORAL | Status: DC
  Administered 2013-07-24 – 2013-07-26 (×3): 60 mg via ORAL
  Filled 2013-07-23 (×3): qty 1

## 2013-07-23 MED ORDER — CEFAZOLIN SODIUM 1-5 GM-% IV SOLN
1.0000 g | Freq: Three times a day (TID) | INTRAVENOUS | Status: DC
  Administered 2013-07-24 – 2013-07-26 (×8): 1 g via INTRAVENOUS
  Filled 2013-07-23 (×10): qty 50

## 2013-07-23 MED ORDER — GLYCOPYRROLATE 0.2 MG/ML IJ SOLN
INTRAMUSCULAR | Status: DC | PRN
  Administered 2013-07-23: .4 mg via INTRAVENOUS

## 2013-07-23 MED ORDER — VITAMIN C 500 MG PO TABS
1000.0000 mg | ORAL_TABLET | Freq: Every day | ORAL | Status: DC
  Administered 2013-07-24 – 2013-07-26 (×3): 1000 mg via ORAL
  Filled 2013-07-23 (×3): qty 2

## 2013-07-23 MED ORDER — ONDANSETRON HCL 4 MG/2ML IJ SOLN
INTRAMUSCULAR | Status: AC
  Administered 2013-07-23: 4 mg via INTRAVENOUS
  Filled 2013-07-23: qty 2

## 2013-07-23 MED ORDER — ROCURONIUM BROMIDE 100 MG/10ML IV SOLN: INTRAVENOUS | Status: DC | PRN

## 2013-07-23 MED ORDER — HYDROMORPHONE HCL PF 1 MG/ML IJ SOLN
INTRAMUSCULAR | Status: AC
  Administered 2013-07-23: 0.5 mg via INTRAVENOUS
  Filled 2013-07-23: qty 1

## 2013-07-23 MED ORDER — INSULIN ASPART 100 UNIT/ML ~~LOC~~ SOLN
0.0000 [IU] | Freq: Three times a day (TID) | SUBCUTANEOUS | Status: DC
  Administered 2013-07-25 – 2013-07-26 (×2): 1 [IU] via SUBCUTANEOUS

## 2013-07-23 MED ORDER — SODIUM CHLORIDE 0.9 % IV SOLN: INTRAVENOUS | Status: AC

## 2013-07-23 MED ORDER — PROMETHAZINE HCL 25 MG RE SUPP
12.5000 mg | Freq: Four times a day (QID) | RECTAL | Status: DC | PRN
  Administered 2013-07-25: 12.5 mg via RECTAL
  Filled 2013-07-23: qty 1

## 2013-07-23 MED ORDER — SUCCINYLCHOLINE CHLORIDE 20 MG/ML IJ SOLN
INTRAMUSCULAR | Status: DC | PRN
  Administered 2013-07-23: 120 mg via INTRAVENOUS

## 2013-07-23 SURGICAL SUPPLY — 62 items
BANDAGE CONFORM 3  STR LF (GAUZE/BANDAGES/DRESSINGS) ×2 IMPLANT
BANDAGE ELASTIC 3 VELCRO ST LF (GAUZE/BANDAGES/DRESSINGS) ×2 IMPLANT
BANDAGE ELASTIC 4 VELCRO ST LF (GAUZE/BANDAGES/DRESSINGS) ×2 IMPLANT
BANDAGE GAUZE ELAST BULKY 4 IN (GAUZE/BANDAGES/DRESSINGS) ×2 IMPLANT
BIT DRILL MICR ACTRK 2 LNG PRF (BIT) ×1 IMPLANT
BIT DRILL MINI LNG ACUTRAK 2 (BIT) ×1 IMPLANT
BLADE SURG ROTATE 9660 (MISCELLANEOUS) IMPLANT
BNDG ESMARK 4X9 LF (GAUZE/BANDAGES/DRESSINGS) ×2 IMPLANT
CLOTH BEACON ORANGE TIMEOUT ST (SAFETY) ×2 IMPLANT
CORDS BIPOLAR (ELECTRODE) ×2 IMPLANT
COVER SURGICAL LIGHT HANDLE (MISCELLANEOUS) ×2 IMPLANT
CUFF TOURNIQUET SINGLE 18IN (TOURNIQUET CUFF) ×2 IMPLANT
CUFF TOURNIQUET SINGLE 24IN (TOURNIQUET CUFF) IMPLANT
DRAIN TLS ROUND 10FR (DRAIN) IMPLANT
DRAPE OEC MINIVIEW 54X84 (DRAPES) IMPLANT
DRAPE SURG 17X23 STRL (DRAPES) ×2 IMPLANT
DRILL MICRO ACUTRAK 2 LNG PROF (BIT) ×2
DRILL MINI LNG ACUTRAK 2 (BIT) ×2
DRSG ADAPTIC 3X8 NADH LF (GAUZE/BANDAGES/DRESSINGS) ×2 IMPLANT
GAUZE XEROFORM 1X8 LF (GAUZE/BANDAGES/DRESSINGS) ×2 IMPLANT
GAUZE XEROFORM 5X9 LF (GAUZE/BANDAGES/DRESSINGS) ×2 IMPLANT
GLOVE BIOGEL M STRL SZ7.5 (GLOVE) ×4 IMPLANT
GLOVE ORTHO TXT STRL SZ7.5 (GLOVE) ×2 IMPLANT
GLOVE SS BIOGEL STRL SZ 8 (GLOVE) ×1 IMPLANT
GLOVE SUPERSENSE BIOGEL SZ 8 (GLOVE) ×1
GOWN PREVENTION PLUS XLARGE (GOWN DISPOSABLE) ×2 IMPLANT
GOWN STRL NON-REIN LRG LVL3 (GOWN DISPOSABLE) ×6 IMPLANT
GOWN STRL REIN XL XLG (GOWN DISPOSABLE) ×4 IMPLANT
GUIDEWIRE ORTHO MICROSHT  ACUT (WIRE) ×2
GUIDEWIRE ORTHO MICROSHT .035 (WIRE) ×2 IMPLANT
GUIDEWIRE ORTHO MINI ACTK .045 (WIRE) ×2 IMPLANT
KIT BASIN OR (CUSTOM PROCEDURE TRAY) ×2 IMPLANT
KIT ROOM TURNOVER OR (KITS) ×2 IMPLANT
LOOP VESSEL MAXI BLUE (MISCELLANEOUS) IMPLANT
MANIFOLD NEPTUNE II (INSTRUMENTS) ×2 IMPLANT
NEEDLE 22X1 1/2 (OR ONLY) (NEEDLE) IMPLANT
NS IRRIG 1000ML POUR BTL (IV SOLUTION) ×2 IMPLANT
PACK ORTHO EXTREMITY (CUSTOM PROCEDURE TRAY) ×2 IMPLANT
PAD ARMBOARD 7.5X6 YLW CONV (MISCELLANEOUS) ×4 IMPLANT
PAD CAST 3X4 CTTN HI CHSV (CAST SUPPLIES) ×1 IMPLANT
PAD CAST 4YDX4 CTTN HI CHSV (CAST SUPPLIES) ×1 IMPLANT
PADDING CAST ABS 4INX4YD NS (CAST SUPPLIES) ×1
PADDING CAST ABS COTTON 4X4 ST (CAST SUPPLIES) ×1 IMPLANT
PADDING CAST COTTON 3X4 STRL (CAST SUPPLIES) ×1
PADDING CAST COTTON 4X4 STRL (CAST SUPPLIES) ×1
SCREW ACUTRAK 2 MICRO 14MM (Screw) ×2 IMPLANT
SCREW ACUTRAK 2 MINI 24MM (Screw) ×2 IMPLANT
SPONGE GAUZE 4X4 12PLY (GAUZE/BANDAGES/DRESSINGS) ×2 IMPLANT
SPONGE LAP 4X18 X RAY DECT (DISPOSABLE) IMPLANT
SUT FIBERWIRE 3-0 18 TAPR NDL (SUTURE) ×2
SUT MNCRL AB 4-0 PS2 18 (SUTURE) ×2 IMPLANT
SUT PROLENE 3 0 PS 2 (SUTURE) IMPLANT
SUT PROLENE 4 0 PS 2 18 (SUTURE) ×8 IMPLANT
SUT VIC AB 3-0 FS2 27 (SUTURE) ×2 IMPLANT
SUTURE FIBERWR 3-0 18 TAPR NDL (SUTURE) ×1 IMPLANT
SYR CONTROL 10ML LL (SYRINGE) IMPLANT
SYSTEM CHEST DRAIN TLS 7FR (DRAIN) ×2 IMPLANT
TOWEL OR 17X24 6PK STRL BLUE (TOWEL DISPOSABLE) ×2 IMPLANT
TOWEL OR 17X26 10 PK STRL BLUE (TOWEL DISPOSABLE) ×2 IMPLANT
TUBE CONNECTING 12X1/4 (SUCTIONS) ×2 IMPLANT
TUBE EVACUATION TLS (MISCELLANEOUS) ×2 IMPLANT
WATER STERILE IRR 1000ML POUR (IV SOLUTION) ×2 IMPLANT
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
## 2013-07-23 NOTE — Anesthesia Procedure Notes (Signed)
Procedure Name: Intubation Date/Time: 07/23/2013 8:43 PM Performed by: Nicholos Johns Pre-anesthesia Checklist: Patient identified, Emergency Drugs available, Timeout performed, Suction available and Patient being monitored Patient Re-evaluated:Patient Re-evaluated prior to inductionOxygen Delivery Method: Circle system utilized Preoxygenation: Pre-oxygenation with 100% oxygen Intubation Type: IV induction and Rapid sequence Ventilation: Mask ventilation without difficulty Laryngoscope Size: Mac and 3 Grade View: Grade I Tube type: Oral Tube size: 7.5 mm Number of attempts: 1 Airway Equipment and Method: Stylet Placement Confirmation: ETT inserted through vocal cords under direct vision,  positive ETCO2 and breath sounds checked- equal and bilateral Secured at: 21 cm Tube secured with: Tape Dental Injury: Teeth and Oropharynx as per pre-operative assessment
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
## 2013-07-23 NOTE — Anesthesia Postprocedure Evaluation (Signed)
Anesthesia Post Note  Patient: Lauren [name]  Procedure(s) Performed: Procedure(s) (LRB): OPEN REDUCTION INTERNAL FIXATION (ORIF) WRIST FRACTURE (Right)  Anesthesia type: General  Patient location: PACU  Post pain: Pain level controlled  Post assessment: Patient's Cardiovascular Status Stable  Last Vitals:  Filed Vitals:   07/23/13 2259  BP: 112/65  Pulse: 94  Temp:   Resp: 14    Post vital signs: Reviewed and stable  Level of consciousness: alert  Complications: No apparent anesthesia complications

## 2013-07-23 NOTE — Anesthesia Preprocedure Evaluation (Addendum)
Anesthesia Evaluation  Patient identified by MRN, date of birth, ID band Patient awake    Reviewed: Allergy & Precautions, H&P , NPO status , Patient's Chart, lab work & pertinent test results, reviewed documented beta blocker date and time   History of Anesthesia Complications Negative for: history of anesthetic complications  Airway Mallampati: II TM Distance: >3 FB Neck ROM: full    Dental   Pulmonary former smoker,  breath sounds clear to auscultation        Cardiovascular negative cardio ROS  Rhythm:regular     Neuro/Psych PSYCHIATRIC DISORDERS  Neuromuscular disease negative neurological ROS     GI/Hepatic negative GI ROS, GERD-  Medicated and Controlled,(+) Hepatitis -, C  Endo/Other  negative endocrine ROS  Renal/GU negative Renal ROS  negative genitourinary   Musculoskeletal   Abdominal   Peds  Hematology negative hematology ROS (+)   Anesthesia Other Findings See surgeon's H&P   Reproductive/Obstetrics negative OB ROS                        Anesthesia Physical Anesthesia Plan  ASA: II and emergent  Anesthesia Plan: General   Post-op Pain Management:    Induction: Intravenous  Airway Management Planned: Oral ETT  Additional Equipment:   Intra-op Plan:   Post-operative Plan: Extubation in OR  Informed Consent: I have reviewed the patients History and Physical, chart, labs and discussed the procedure including the risks, benefits and alternatives for the proposed anesthesia with the patient or authorized representative who has indicated his/her understanding and acceptance.   Dental Advisory Given  Plan Discussed with: CRNA and Surgeon  Anesthesia Plan Comments:         Anesthesia Quick Evaluation
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
## 2013-07-23 NOTE — ED Notes (Signed)
LSB removed by Dr. Adriana Simas assisted x 2. C-Collar remains in place. Pt also stating head pain, abrasion to neck.
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
## 2013-07-23 NOTE — Transfer of Care (Signed)
Immediate Anesthesia Transfer of Care Note  Patient: Lauren [name]  Procedure(s) Performed: Procedure(s): OPEN REDUCTION INTERNAL FIXATION (ORIF) WRIST FRACTURE (Right)  Patient Location: PACU  Anesthesia Type:General  Level of Consciousness: awake, alert  and oriented   Airway & Oxygen Therapy: Patient Spontanous Breathing and Patient connected to nasal cannula oxygen  Post-op Assessment: Report given to PACU RN and Post -op Vital signs reviewed and stable  Post vital signs: Reviewed and stable  Complications: No apparent anesthesia complications

## 2013-07-23 NOTE — ED Notes (Signed)
Dr Amanda Pea called and notified of patient's arrival.
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
## 2013-07-23 NOTE — ED Notes (Addendum)
C-Collar removed after c-spine cleared by EDP

## 2013-07-23 NOTE — Preoperative (Signed)
Beta Blockers   Reason not to administer Beta Blockers:Not Applicable 

## 2013-07-23 NOTE — Op Note (Signed)
See dictation #[id number] Wendall Stade Md

## 2013-07-24 LAB — COMPREHENSIVE METABOLIC PANEL
ALT: 25 U/L (ref 0–35)
Albumin: 3.5 g/dL (ref 3.5–5.2)
Alkaline Phosphatase: 76 U/L (ref 39–117)
BUN: 10 mg/dL (ref 6–23)
Chloride: 90 mEq/L — ABNORMAL LOW (ref 96–112)
Potassium: 4.2 mEq/L (ref 3.5–5.1)
Sodium: 129 mEq/L — ABNORMAL LOW (ref 135–145)
Total Bilirubin: 0.6 mg/dL (ref 0.3–1.2)
Total Protein: 7.2 g/dL (ref 6.0–8.3)

## 2013-07-24 LAB — CBC
HCT: 35.8 % — ABNORMAL LOW (ref 36.0–46.0)
Hemoglobin: 11.6 g/dL — ABNORMAL LOW (ref 12.0–15.0)
MCH: 27 pg (ref 26.0–34.0)
MCV: 83.4 fL (ref 78.0–100.0)
Platelets: 167 10*3/uL (ref 150–400)
RDW: 13.7 % (ref 11.5–15.5)
WBC: 8.9 10*3/uL (ref 4.0–10.5)

## 2013-07-24 LAB — GLUCOSE, CAPILLARY
Glucose-Capillary: 113 mg/dL — ABNORMAL HIGH (ref 70–99)
Glucose-Capillary: 121 mg/dL — ABNORMAL HIGH (ref 70–99)
Glucose-Capillary: 152 mg/dL — ABNORMAL HIGH (ref 70–99)
Glucose-Capillary: 131 mg/dL — ABNORMAL HIGH (ref 70–99)
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
## 2013-07-24 NOTE — Significant Event (Signed)
Rapid Response Event Note  Overview:  Time Called: 1455 Arrival Time: 1458 Event Type: Other (Comment)  Initial Focused Assessment:  Called by primary RN for patient unresponsive for about 20 seconds.  As per RN and family, patient was sitting on commode where she was unresponsive for about 20 seconds and slumped to the side a bit.  VSS, 135/72, 93% on nasal cannula 2 lpm hr 70's.  Patient does not remember episode.  Patient c/o headache and feeling woozy.   Interventions:  MD paged and notified   Event Summary: Rn to call if assistance needed   at      at          [facility]
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
## 2013-07-24 NOTE — Progress Notes (Signed)
RRT RN called to bedside at 1455. MD notified of situation. AM labs ordered. Will continue to monitor and assess.   Horton Marshall, RN

## 2013-07-24 NOTE — Progress Notes (Signed)
Orthopedic Tech Progress Note Patient Details:  Lauren [name] September 17, 1950 [id number]  Ortho Devices Type of Ortho Device: Arm sling Ortho Device/Splint Location: RUE Ortho Device/Splint Interventions: Casandra Doffing 07/24/2013, 6:38 PM

## 2013-07-25 LAB — CBC
HCT: 34.8 % — ABNORMAL LOW (ref 36.0–46.0)
Hemoglobin: 11.7 g/dL — ABNORMAL LOW (ref 12.0–15.0)
MCH: 27.2 pg (ref 26.0–34.0)
MCHC: 33.6 g/dL (ref 30.0–36.0)
RBC: 4.3 MIL/uL (ref 3.87–5.11)
RDW: 13 % (ref 11.5–15.5)

## 2013-07-25 LAB — COMPREHENSIVE METABOLIC PANEL
ALT: 22 U/L (ref 0–35)
AST: 34 U/L (ref 0–37)
Albumin: 3.2 g/dL — ABNORMAL LOW (ref 3.5–5.2)
Alkaline Phosphatase: 72 U/L (ref 39–117)
BUN: 8 mg/dL (ref 6–23)
CO2: 28 mEq/L (ref 19–32)
Chloride: 87 mEq/L — ABNORMAL LOW (ref 96–112)
GFR calc Af Amer: >90 mL/min (ref >90)
GFR calc non Af Amer: >90 mL/min (ref >90)
Glucose, Bld: 108 mg/dL — ABNORMAL HIGH (ref 70–99)
Potassium: 3.9 mEq/L (ref 3.5–5.1)
Sodium: 124 mEq/L — ABNORMAL LOW (ref 135–145)
Total Bilirubin: 0.7 mg/dL (ref 0.3–1.2)
Total Protein: 6.8 g/dL (ref 6.0–8.3)

## 2013-07-25 LAB — GLUCOSE, CAPILLARY
Glucose-Capillary: 114 mg/dL — ABNORMAL HIGH (ref 70–99)
Glucose-Capillary: 144 mg/dL — ABNORMAL HIGH (ref 70–99)

## 2013-07-25 MED ORDER — SODIUM CHLORIDE 0.9 % IV SOLN
INTRAVENOUS | Status: DC
  Administered 2013-07-25 – 2013-07-26 (×2): via INTRAVENOUS

## 2013-07-25 NOTE — Care Management Utilization Note (Signed)
Utilization review completed. Roshawn Lacina, RN BSN 
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
## 2013-07-25 NOTE — Progress Notes (Signed)
Subjective: 2 Days Post-Op Procedure(s) (LRB): OPEN REDUCTION INTERNAL FIXATION (ORIF) WRIST FRACTURE (Right) Patient reports pain as 6/10 at this juncture. He denies a n nausea vomiting shortness of breath fever or chills. She does have thoracic pain as noted. Shows an acute T9 compression fracture treated conservatively. Overall the wrist is doing fairly well  Objective: Vital signs in last 24 hours: Temp:  [97.5 F (36.4 C)-98.9 F (37.2 C)] 98 F (36.7 C) (11/20 0513) Pulse Rate:  [77-102] 86 (11/20 0513) Resp:  [18-19] 18 (11/20 0513) BP: (143-150)/(72-86) 143/78 mmHg (11/20 0513) SpO2:  [93 %-97 %] 97 % (11/20 0513)  Intake/Output from previous day: 11/19 0701 - 11/20 0700 In: 2172.3 [P.O.:480; I.V.:1592.3; IV Piggyback:100] Out: 3225 [Urine:3225] Intake/Output this shift: Total I/O In: -  Out: 1200 [Urine:1200]   Recent Labs  07/23/13 1027 07/23/13 1441 07/24/13 1658 07/25/13 0343  HGB 14.6 13.0 11.6* 11.7*    Recent Labs  07/24/13 1658 07/25/13 0343  WBC 8.9 8.4  RBC 4.29 4.30  HCT 35.8* 34.8*  PLT 167 174    Recent Labs  07/24/13 1658 07/25/13 0343  NA 129* 124*  K 4.2 3.9  CL 90* 87*  CO2 29 28  BUN 10 8  CREATININE 0.61 0.50  GLUCOSE 144* 108*  CALCIUM 8.8 8.7   No results found for this basename: LABPT, INR,  in the last 72 hours  Laceration right upper extremity shows her splint is intact digital range of motion is intact no signs of infection are present.  Assessment/Plan: 2 Days Post-Op Procedure(s) (LRB): OPEN REDUCTION INTERNAL FIXATION (ORIF) WRIST FRACTURE (Right) Continue in management, therapy for ambulation and ADLs. We will have her nonweightbearing about the right upper extremity and we'll have her avoid any bending at the waist given the T9 compression fracture. She'll need to work him diligent finger range of motion and elevation. Recheck her tomorrow. Arish Redner L 07/25/2013, 1:19 PM
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
## 2013-07-26 ENCOUNTER — Ambulatory Visit (INDEPENDENT_AMBULATORY_CARE_PROVIDER_SITE_OTHER): Payer: Self-pay | Admitting: Internal Medicine

## 2013-07-26 ENCOUNTER — Encounter (HOSPITAL_COMMUNITY): Payer: Self-pay | Admitting: Orthopedic Surgery

## 2013-07-26 LAB — COMPREHENSIVE METABOLIC PANEL
ALT: 27 U/L (ref 0–35)
CO2: 27 mEq/L (ref 19–32)
Calcium: 8.4 mg/dL (ref 8.4–10.5)
Creatinine, Ser: 0.51 mg/dL (ref 0.50–1.10)
GFR calc Af Amer: >90 mL/min (ref >90)
Glucose, Bld: 132 mg/dL — ABNORMAL HIGH (ref 70–99)
Sodium: 124 mEq/L — ABNORMAL LOW (ref 135–145)
Total Bilirubin: 0.5 mg/dL (ref 0.3–1.2)
Total Protein: 6.8 g/dL (ref 6.0–8.3)

## 2013-07-26 LAB — GLUCOSE, CAPILLARY
Glucose-Capillary: 131 mg/dL — ABNORMAL HIGH (ref 70–99)
Glucose-Capillary: 118 mg/dL — ABNORMAL HIGH (ref 70–99)
Glucose-Capillary: 113 mg/dL — ABNORMAL HIGH (ref 70–99)

## 2013-07-26 LAB — CBC
Hemoglobin: 11.7 g/dL — ABNORMAL LOW (ref 12.0–15.0)
MCH: 27.3 pg (ref 26.0–34.0)
MCHC: 33.7 g/dL (ref 30.0–36.0)

## 2013-07-26 MED ORDER — METHOCARBAMOL 500 MG PO TABS: 500.0000 mg | ORAL_TABLET | Freq: Four times a day (QID) | ORAL | Status: DC | PRN

## 2013-07-26 MED ORDER — OXYCODONE HCL 5 MG PO TABS
5.0000 mg | ORAL_TABLET | ORAL | Status: DC | PRN
Start: 2013-07-26 — End: 2014-03-17

## 2013-07-26 MED ORDER — CEPHALEXIN 500 MG PO CAPS: 500.0000 mg | ORAL_CAPSULE | Freq: Four times a day (QID) | ORAL | Status: DC

## 2013-07-26 NOTE — Progress Notes (Signed)
07/26/2013 1100 NCM spoke to pt and offered choice for [facility]. Pt agreeable to [facility] for HH. She has received her platform RW. States her husband is at home to assist with care. Isidoro Donning RN CCM Case Mgmt phone [phone]
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
## 2013-08-06 ENCOUNTER — Other Ambulatory Visit (HOSPITAL_COMMUNITY): Payer: Self-pay | Admitting: Family Medicine

## 2013-08-06 DIAGNOSIS — Z139 Encounter for screening, unspecified: Secondary | ICD-10-CM

## 2013-08-19 ENCOUNTER — Other Ambulatory Visit (HOSPITAL_COMMUNITY): Payer: Self-pay

## 2013-09-13 ENCOUNTER — Ambulatory Visit (INDEPENDENT_AMBULATORY_CARE_PROVIDER_SITE_OTHER): Payer: Medicare Other | Admitting: Psychiatry

## 2013-09-13 ENCOUNTER — Encounter (HOSPITAL_COMMUNITY): Payer: Self-pay | Admitting: Psychiatry

## 2013-09-13 VITALS — BP 120/78 | Ht 68.0 in | Wt 152.0 lb

## 2013-09-13 DIAGNOSIS — F329 Major depressive disorder, single episode, unspecified: Secondary | ICD-10-CM

## 2013-09-13 DIAGNOSIS — F39 Unspecified mood [affective] disorder: Secondary | ICD-10-CM

## 2013-09-13 DIAGNOSIS — G4700 Insomnia, unspecified: Secondary | ICD-10-CM

## 2013-09-13 MED ORDER — GABAPENTIN 300 MG PO CAPS: 300.0000 mg | ORAL_CAPSULE | Freq: Three times a day (TID) | ORAL | Status: DC

## 2013-09-13 MED ORDER — LORAZEPAM 1 MG PO TABS: 1.0000 mg | ORAL_TABLET | Freq: Every evening | ORAL | Status: DC | PRN

## 2013-09-13 MED ORDER — RISPERIDONE 0.5 MG PO TABS: 0.5000 mg | ORAL_TABLET | Freq: Two times a day (BID) | ORAL | Status: DC

## 2013-09-13 MED ORDER — DULOXETINE HCL 60 MG PO CPEP: 60.0000 mg | ORAL_CAPSULE | Freq: Every day | ORAL | Status: DC
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
## 2013-09-27 ENCOUNTER — Ambulatory Visit (HOSPITAL_COMMUNITY): Payer: Self-pay | Admitting: Psychology

## 2013-10-11 ENCOUNTER — Encounter (HOSPITAL_COMMUNITY): Payer: Self-pay | Admitting: Psychiatry

## 2013-10-11 ENCOUNTER — Ambulatory Visit (INDEPENDENT_AMBULATORY_CARE_PROVIDER_SITE_OTHER): Payer: Medicare Other | Admitting: Psychiatry

## 2013-10-11 VITALS — BP 120/80 | Ht 66.0 in | Wt 149.0 lb

## 2013-10-11 DIAGNOSIS — F329 Major depressive disorder, single episode, unspecified: Secondary | ICD-10-CM

## 2013-10-11 DIAGNOSIS — G4700 Insomnia, unspecified: Secondary | ICD-10-CM

## 2013-10-11 DIAGNOSIS — F39 Unspecified mood [affective] disorder: Secondary | ICD-10-CM
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
## 2013-10-17 ENCOUNTER — Ambulatory Visit (INDEPENDENT_AMBULATORY_CARE_PROVIDER_SITE_OTHER): Payer: Medicare Other | Admitting: Psychology

## 2013-10-17 DIAGNOSIS — F411 Generalized anxiety disorder: Secondary | ICD-10-CM

## 2013-10-17 DIAGNOSIS — F39 Unspecified mood [affective] disorder: Secondary | ICD-10-CM

## 2013-10-17 DIAGNOSIS — F419 Anxiety disorder, unspecified: Secondary | ICD-10-CM

## 2013-10-23 ENCOUNTER — Encounter (HOSPITAL_COMMUNITY): Payer: Self-pay | Admitting: Psychology
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
## 2013-11-01 ENCOUNTER — Ambulatory Visit (INDEPENDENT_AMBULATORY_CARE_PROVIDER_SITE_OTHER): Payer: Medicare Other | Admitting: Psychology

## 2013-11-01 ENCOUNTER — Encounter (HOSPITAL_COMMUNITY): Payer: Self-pay | Admitting: Psychology

## 2013-11-01 DIAGNOSIS — F419 Anxiety disorder, unspecified: Secondary | ICD-10-CM

## 2013-11-01 DIAGNOSIS — F39 Unspecified mood [affective] disorder: Secondary | ICD-10-CM

## 2013-11-01 DIAGNOSIS — F411 Generalized anxiety disorder: Secondary | ICD-10-CM
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
## 2013-11-20 ENCOUNTER — Telehealth (HOSPITAL_COMMUNITY): Payer: Self-pay | Admitting: *Deleted

## 2013-11-22 ENCOUNTER — Ambulatory Visit (INDEPENDENT_AMBULATORY_CARE_PROVIDER_SITE_OTHER): Payer: Medicare Other | Admitting: Psychology

## 2013-11-22 ENCOUNTER — Encounter (HOSPITAL_COMMUNITY): Payer: Self-pay | Admitting: Psychology

## 2013-11-22 DIAGNOSIS — F411 Generalized anxiety disorder: Secondary | ICD-10-CM

## 2013-11-22 DIAGNOSIS — F419 Anxiety disorder, unspecified: Secondary | ICD-10-CM

## 2013-11-22 DIAGNOSIS — F39 Unspecified mood [affective] disorder: Secondary | ICD-10-CM
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
## 2013-12-03 ENCOUNTER — Encounter (HOSPITAL_COMMUNITY): Payer: Self-pay | Admitting: Psychiatry

## 2013-12-03 ENCOUNTER — Ambulatory Visit (INDEPENDENT_AMBULATORY_CARE_PROVIDER_SITE_OTHER): Payer: Medicare Other | Admitting: Psychiatry

## 2013-12-03 VITALS — BP 110/80 | Ht 66.0 in | Wt 152.0 lb

## 2013-12-03 DIAGNOSIS — F1021 Alcohol dependence, in remission: Secondary | ICD-10-CM

## 2013-12-03 DIAGNOSIS — F39 Unspecified mood [affective] disorder: Secondary | ICD-10-CM

## 2013-12-03 DIAGNOSIS — F329 Major depressive disorder, single episode, unspecified: Secondary | ICD-10-CM

## 2013-12-03 DIAGNOSIS — R52 Pain, unspecified: Secondary | ICD-10-CM

## 2013-12-03 DIAGNOSIS — G4700 Insomnia, unspecified: Secondary | ICD-10-CM

## 2013-12-03 MED ORDER — RISPERIDONE 0.5 MG PO TABS: 0.5000 mg | ORAL_TABLET | Freq: Two times a day (BID) | ORAL | Status: DC

## 2013-12-03 MED ORDER — PRAZOSIN HCL 5 MG PO CAPS: 5.0000 mg | ORAL_CAPSULE | Freq: Every day | ORAL | Status: DC

## 2013-12-03 MED ORDER — GABAPENTIN 300 MG PO CAPS: 300.0000 mg | ORAL_CAPSULE | Freq: Three times a day (TID) | ORAL | Status: DC

## 2013-12-03 MED ORDER — DULOXETINE HCL 60 MG PO CPEP: 60.0000 mg | ORAL_CAPSULE | Freq: Every day | ORAL | Status: DC
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
## 2013-12-06 ENCOUNTER — Ambulatory Visit (HOSPITAL_COMMUNITY): Payer: Self-pay | Admitting: Psychiatry

## 2013-12-19 ENCOUNTER — Ambulatory Visit (INDEPENDENT_AMBULATORY_CARE_PROVIDER_SITE_OTHER): Payer: Medicare Other | Admitting: Psychology

## 2013-12-19 DIAGNOSIS — F39 Unspecified mood [affective] disorder: Secondary | ICD-10-CM

## 2013-12-19 DIAGNOSIS — F411 Generalized anxiety disorder: Secondary | ICD-10-CM

## 2013-12-19 DIAGNOSIS — F419 Anxiety disorder, unspecified: Secondary | ICD-10-CM

## 2013-12-20 ENCOUNTER — Encounter (HOSPITAL_COMMUNITY): Payer: Self-pay | Admitting: Psychology
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
## 2013-12-25 NOTE — Telephone Encounter (Signed)
Encounter has been closed--TP 12/25/13 

## 2013-12-30 ENCOUNTER — Encounter (INDEPENDENT_AMBULATORY_CARE_PROVIDER_SITE_OTHER): Payer: Self-pay | Admitting: *Deleted

## 2013-12-31 ENCOUNTER — Ambulatory Visit (HOSPITAL_COMMUNITY): Payer: Self-pay | Admitting: Psychiatry

## 2014-01-09 ENCOUNTER — Ambulatory Visit (INDEPENDENT_AMBULATORY_CARE_PROVIDER_SITE_OTHER): Payer: Medicare Other | Admitting: Psychology

## 2014-01-09 ENCOUNTER — Encounter (HOSPITAL_COMMUNITY): Payer: Self-pay | Admitting: Psychology

## 2014-01-09 DIAGNOSIS — F39 Unspecified mood [affective] disorder: Secondary | ICD-10-CM

## 2014-01-09 NOTE — Progress Notes (Signed)
    PROGRESS NOTE   PROGRESS NOTE    THERAPIST PROGRESS NOTE  Patient:  Lauren [name]   DOB: 10/11/1950  MR Number: [id number]  Location: [facility] [address] [place] [address] Dept: [phone]  Start: 2 PM End: 3 PM  Provider/Observer:     Edgardo Roys PSYD  Chief Complaint:      Chief Complaint  Patient presents with  . Depression  . Anxiety  . Agitation  . Stress  . Trauma    Reason For Service:     The patient has a long history of major depressive condition and ongoing underlying depressive disorder. She is a recovering alcoholic but has many many years this Friday. Significant symptoms of anxiety have played a major role in substance abuse in the past. Significant medical problems and her inability to work have complicated her situation. When all these difficulties are put in the context and relationship to potential workability it is clear that she is disabled not do not expect her to return to gainful employment.  Interventions Strategy:  Cognitive/behavioral psychotherapy  Participation Level:   Active  Participation Quality:  Appropriate      Behavioral Observation:  Well Groomed, Alert, and Appropriate.   Current Psychosocial Factors: The patient reports that she has turned in her 2 week notice as she just could not keep up with even the limited job.  She is more relaxed and not completely overwhelmed with anxiety and stress.   Content of Session:   Review current symptoms and continue to work on therapeutic interventions for improved coping skills and strategies.  Current Status:   The patient reports that she is still having some fatigue and thinks some of there medications may be playing a role, although when she has tried to reduce her anxiety becomes overwhelming..  Patient Progress:   Stable and improving  Target Goals:   Reduce the frequency of  depressive events including feelings of hopelessness and helplessness, anhedonia, social withdrawal, as well as significant anxiety symptoms related to social avoidance, fear of abandonment and fear of inability to manage and cope with her world. We will also look at continuing complete sobriety.  Last Reviewed:   01/09/2014   Goals Addressed Today:    Target goals addressed today have to do with issues of depression in particular feelings of helplessness and hopelessness.   Impression/Diagnosis:   The patient has a long history of depressive disorder and underlying anxiety disorder. She has had numerous medical issues including severe fibromyalgia and severe arthritis. She has been diagnosed with significant arthritis throughout her body and in particular her hands and her feet. Numerous events and left are overwhelmed and unable to work. The patient has had periodic times of improvement for primary symptoms but there are other times where the symptoms are so problematic that she has not been able to maintain gainful employment.  Diagnosis:    Axis I:  Mood disorder    Edgardo Roys, PsyD 01/09/2014    Vincent Streater R, PsyD 01/09/2014     Edgardo Roys, PsyD 01/09/2014

## 2014-01-10 ENCOUNTER — Other Ambulatory Visit: Payer: Self-pay | Admitting: *Deleted

## 2014-01-10 MED ORDER — PRAVASTATIN SODIUM 20 MG PO TABS: 20.0000 mg | ORAL_TABLET | Freq: Every day | ORAL | Status: DC

## 2014-01-13 ENCOUNTER — Ambulatory Visit (HOSPITAL_COMMUNITY): Payer: Self-pay | Admitting: Psychiatry

## 2014-01-14 ENCOUNTER — Encounter (HOSPITAL_COMMUNITY): Payer: Self-pay | Admitting: Psychiatry

## 2014-01-14 ENCOUNTER — Ambulatory Visit (INDEPENDENT_AMBULATORY_CARE_PROVIDER_SITE_OTHER): Payer: Medicare Other | Admitting: Psychiatry

## 2014-01-14 VITALS — BP 110/70 | Ht 66.0 in | Wt 144.0 lb

## 2014-01-14 DIAGNOSIS — G4700 Insomnia, unspecified: Secondary | ICD-10-CM

## 2014-01-14 DIAGNOSIS — F419 Anxiety disorder, unspecified: Secondary | ICD-10-CM

## 2014-01-14 DIAGNOSIS — F1021 Alcohol dependence, in remission: Secondary | ICD-10-CM

## 2014-01-14 DIAGNOSIS — F39 Unspecified mood [affective] disorder: Secondary | ICD-10-CM

## 2014-01-14 DIAGNOSIS — F329 Major depressive disorder, single episode, unspecified: Secondary | ICD-10-CM

## 2014-01-14 DIAGNOSIS — R52 Pain, unspecified: Secondary | ICD-10-CM

## 2014-01-14 MED ORDER — VENLAFAXINE HCL ER 75 MG PO CP24: 75.0000 mg | ORAL_CAPSULE | Freq: Every day | ORAL | Status: DC

## 2014-01-14 MED ORDER — LORAZEPAM 0.5 MG PO TABS: 0.5000 mg | ORAL_TABLET | Freq: Every day | ORAL | Status: DC

## 2014-01-14 MED ORDER — RISPERIDONE 0.5 MG PO TABS: 0.5000 mg | ORAL_TABLET | Freq: Two times a day (BID) | ORAL | Status: DC

## 2014-01-14 MED ORDER — DULOXETINE HCL 60 MG PO CPEP: 60.0000 mg | ORAL_CAPSULE | Freq: Every day | ORAL | Status: DC

## 2014-01-14 NOTE — Progress Notes (Signed)
Patient ID: Lauren [name], female   DOB: 08-Dec-1950, 63 y.o.   MRN: [id number] Patient ID: Lauren [name], female   DOB: [birth year], 63 y.o.   MRN: [id number] Patient ID: Lauren [name], female   DOB: June 16, 1951, 63 y.o.   MRN: [id number] Patient ID: Lauren [name], female   DOB: 1951/04/04, 63 y.o.   MRN: [id number] Patient ID: Lauren [name], female   DOB: 07-10-1951, 63 y.o.   MRN: [id number] [place] [id number] Progress Note Lauren [name] MRN: [id number] DOB: April 23, 1951 Age: 63 y.o.  Date: 01/14/2014  Chief Complaint: Chief Complaint  Patient presents with  . Anxiety  . Depression  . Follow-up   Subjective: "I'm still depressed."  History of presenting illness This patient is a 63 year old married white female who lives with her husband. She has one stepchild. Her step son killed himself 7 years ago at age 63. He was an alcoholic. 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  The patient has a long history of depression. She was last hospitalized in 2010 after she took too many pain pills and got confused. She use to drink heavily but has been sober 23 years and is very active in [employer] and church. For the most part she feels her mood is stable. She sleeping pretty well. Her energy is good. She has a hard time functioning without Ativan but she only usually takes one pill a day side think this is reasonable. She tried a higher dose of Cymbalta but it made her manic and revved up  The patient returns after 2 months. Last time we had a long discussion about not combining Ativan and Xanax. She claims now she only uses the Ativan at bedtime. She states that she is still depressed and doesn't have much energy. She's going to quit her job at the dialysis clinic. She claims she did well on Effexor in the past in terms of depression but Cymbalta helps her fibromyalgia. I told her we could add a little bit of Effexor  to the Cymbalta and watch carefully for any signs of tremor shaking or nausea. She denies any suicidal ideation  Allergies: Allergies  Allergen Reactions  . Elavil [Amitriptyline] Other (See Comments)    Vivid dreams and almost suicidal  . Flagyl [Metronidazole] Itching  . Vistaril [Hydroxyzine Hcl] Other (See Comments)    Got higher than a kite on too much of this.  Lindajo Royal [Ziprasidone Hydrochloride]   . Lactose Intolerance (Gi)   Medical History: Past Medical History  Diagnosis Date  . Depression   . Osteoarthritis   . Fibromyalgia   . IBS (irritable bowel syndrome)   . GERD (gastroesophageal reflux disease)   . Prediabetes   . Chronic pain   Surgical History: Past Surgical History  Procedure Laterality Date  . Tonsillectomy    . Cholecystectomy    . Colonoscopy    . Upper gastrointestinal endoscopy    . Mandible surgery  09/06/1979  . Orif wrist fracture Right 07/23/2013    Procedure: OPEN REDUCTION INTERNAL FIXATION (ORIF) WRIST FRACTURE;  Surgeon: Roseanne Kaufman, MD;  Location: [place];  Service: Orthopedics;  Laterality: Right;  Family History family history includes Alcohol abuse in her cousin, father, maternal grandfather, maternal grandmother, paternal grandfather, paternal grandmother, and paternal uncle; Anxiety disorder in her maternal uncle and paternal uncle; Depression in her father; Diabetes in her brother, brother, and father; Healthy in  her brother; Heart disease in her brother; Irritable bowel syndrome in her mother; OCD in her brother and brother; Sexual abuse in her mother; Stroke in her father and mother. There is no history of ADD / ADHD, Bipolar disorder, Dementia, Drug abuse, Paranoid behavior, Schizophrenia, Seizures, or Physical abuse. Reviewed al this again to day in the appointment and nothing has changed  Mental status examination Patient is fairly groomed and dressed in her work uniform. She is anxious and mildly labile his usual. She claims she is  still depressed is going to quit her job so she is more time to exercise with her husband.  She denies any active or passive suicidal thoughts or homicidal thoughts.  She denies paranoia and she denies any hallucination. Her attention and concentration is fair. There were no obsession present at this time.  She's alert and oriented x3 and her insight judgment and impulse control is okay. .   Lab Results:  Results for orders placed during the hospital encounter of 07/23/13 (from the past 8736 hour(s))  POCT I-STAT, CHEM 8   Collection Time    07/23/13 10:27 AM      Result Value Ref Range   Sodium 140  135 - 145 mEq/L   Potassium 4.5  3.5 - 5.1 mEq/L   Chloride 101  96 - 112 mEq/L   BUN 20  6 - 23 mg/dL   Creatinine, Ser 0.90  0.50 - 1.10 mg/dL   Glucose, Bld 123 (*) 70 - 99 mg/dL   Calcium, Ion 1.26  1.13 - 1.30 mmol/L   TCO2 29  0 - 100 mmol/L   Hemoglobin 14.6  12.0 - 15.0 g/dL   HCT 43.0  36.0 - 46.0 %  CBC WITH DIFFERENTIAL   Collection Time    07/23/13  2:41 PM      Result Value Ref Range   WBC 9.2  4.0 - 10.5 K/uL   RBC 4.85  3.87 - 5.11 MIL/uL   Hemoglobin 13.0  12.0 - 15.0 g/dL   HCT 40.9  36.0 - 46.0 %   MCV 84.3  78.0 - 100.0 fL   MCH 26.8  26.0 - 34.0 pg   MCHC 31.8  30.0 - 36.0 g/dL   RDW 13.6  11.5 - 15.5 %   Platelets 205  150 - 400 K/uL   Neutrophils Relative % 76  43 - 77 %   Neutro Abs 7.0  1.7 - 7.7 K/uL   Lymphocytes Relative 14  12 - 46 %   Lymphs Abs 1.3  0.7 - 4.0 K/uL   Monocytes Relative 7  3 - 12 %   Monocytes Absolute 0.6  0.1 - 1.0 K/uL   Eosinophils Relative 2  0 - 5 %   Eosinophils Absolute 0.2  0.0 - 0.7 K/uL   Basophils Relative 0  0 - 1 %   Basophils Absolute 0.0  0.0 - 0.1 K/uL  BASIC METABOLIC PANEL   Collection Time    07/23/13  2:41 PM      Result Value Ref Range   Sodium 140  135 - 145 mEq/L   Potassium 4.2  3.5 - 5.1 mEq/L   Chloride 100  96 - 112 mEq/L   CO2 32  19 - 32 mEq/L   Glucose, Bld 100 (*) 70 - 99 mg/dL   BUN 15  6 - 23  mg/dL   Creatinine, Ser 0.64  0.50 - 1.10 mg/dL   Calcium 9.8  8.4 - 10.5  mg/dL   GFR calc non Af Amer >90  >90 mL/min   GFR calc Af Amer >90  >90 mL/min  GLUCOSE, CAPILLARY   Collection Time    07/23/13 10:30 PM      Result Value Ref Range   Glucose-Capillary 112 (*) 70 - 99 mg/dL  GLUCOSE, CAPILLARY   Collection Time    07/24/13  7:28 AM      Result Value Ref Range   Glucose-Capillary 113 (*) 70 - 99 mg/dL  GLUCOSE, CAPILLARY   Collection Time    07/24/13 11:54 AM      Result Value Ref Range   Glucose-Capillary 121 (*) 70 - 99 mg/dL  GLUCOSE, CAPILLARY   Collection Time    07/24/13  2:50 PM      Result Value Ref Range   Glucose-Capillary 152 (*) 70 - 99 mg/dL  GLUCOSE, CAPILLARY   Collection Time    07/24/13  4:23 PM      Result Value Ref Range   Glucose-Capillary 138 (*) 70 - 99 mg/dL   Comment 1 Documented in Chart     Comment 2 Notify RN    COMPREHENSIVE METABOLIC PANEL   Collection Time    07/24/13  4:58 PM      Result Value Ref Range   Sodium 129 (*) 135 - 145 mEq/L   Potassium 4.2  3.5 - 5.1 mEq/L   Chloride 90 (*) 96 - 112 mEq/L   CO2 29  19 - 32 mEq/L   Glucose, Bld 144 (*) 70 - 99 mg/dL   BUN 10  6 - 23 mg/dL   Creatinine, Ser 0.61  0.50 - 1.10 mg/dL   Calcium 8.8  8.4 - 10.5 mg/dL   Total Protein 7.2  6.0 - 8.3 g/dL   Albumin 3.5  3.5 - 5.2 g/dL   AST 37  0 - 37 U/L   ALT 25  0 - 35 U/L   Alkaline Phosphatase 76  39 - 117 U/L   Total Bilirubin 0.6  0.3 - 1.2 mg/dL   GFR calc non Af Amer >90  >90 mL/min   GFR calc Af Amer >90  >90 mL/min  CBC   Collection Time    07/24/13  4:58 PM      Result Value Ref Range   WBC 8.9  4.0 - 10.5 K/uL   RBC 4.29  3.87 - 5.11 MIL/uL   Hemoglobin 11.6 (*) 12.0 - 15.0 g/dL   HCT 35.8 (*) 36.0 - 46.0 %   MCV 83.4  78.0 - 100.0 fL   MCH 27.0  26.0 - 34.0 pg   MCHC 32.4  30.0 - 36.0 g/dL   RDW 13.7  11.5 - 15.5 %   Platelets 167  150 - 400 K/uL  GLUCOSE, CAPILLARY   Collection Time    07/24/13  9:44 PM       Result Value Ref Range   Glucose-Capillary 131 (*) 70 - 99 mg/dL  CBC   Collection Time    07/25/13  3:43 AM      Result Value Ref Range   WBC 8.4  4.0 - 10.5 K/uL   RBC 4.30  3.87 - 5.11 MIL/uL   Hemoglobin 11.7 (*) 12.0 - 15.0 g/dL   HCT 34.8 (*) 36.0 - 46.0 %   MCV 80.9  78.0 - 100.0 fL   MCH 27.2  26.0 - 34.0 pg   MCHC 33.6  30.0 - 36.0 g/dL   RDW 13.0  11.5 -  15.5 %   Platelets 174  150 - 400 K/uL  COMPREHENSIVE METABOLIC PANEL   Collection Time    07/25/13  3:43 AM      Result Value Ref Range   Sodium 124 (*) 135 - 145 mEq/L   Potassium 3.9  3.5 - 5.1 mEq/L   Chloride 87 (*) 96 - 112 mEq/L   CO2 28  19 - 32 mEq/L   Glucose, Bld 108 (*) 70 - 99 mg/dL   BUN 8  6 - 23 mg/dL   Creatinine, Ser 0.50  0.50 - 1.10 mg/dL   Calcium 8.7  8.4 - 10.5 mg/dL   Total Protein 6.8  6.0 - 8.3 g/dL   Albumin 3.2 (*) 3.5 - 5.2 g/dL   AST 34  0 - 37 U/L   ALT 22  0 - 35 U/L   Alkaline Phosphatase 72  39 - 117 U/L   Total Bilirubin 0.7  0.3 - 1.2 mg/dL   GFR calc non Af Amer >90  >90 mL/min   GFR calc Af Amer >90  >90 mL/min  GLUCOSE, CAPILLARY   Collection Time    07/25/13  6:47 AM      Result Value Ref Range   Glucose-Capillary 119 (*) 70 - 99 mg/dL  GLUCOSE, CAPILLARY   Collection Time    07/25/13 11:17 AM      Result Value Ref Range   Glucose-Capillary 124 (*) 70 - 99 mg/dL   Comment 1 Documented in Chart     Comment 2 Notify RN    GLUCOSE, CAPILLARY   Collection Time    07/25/13 12:28 PM      Result Value Ref Range   Glucose-Capillary 167 (*) 70 - 99 mg/dL  GLUCOSE, CAPILLARY   Collection Time    07/25/13  4:26 PM      Result Value Ref Range   Glucose-Capillary 114 (*) 70 - 99 mg/dL   Comment 1 Documented in Chart     Comment 2 Notify RN    GLUCOSE, CAPILLARY   Collection Time    07/25/13  9:09 PM      Result Value Ref Range   Glucose-Capillary 144 (*) 70 - 99 mg/dL  GLUCOSE, CAPILLARY   Collection Time    07/26/13  7:07 AM      Result Value Ref Range    Glucose-Capillary 131 (*) 70 - 99 mg/dL  COMPREHENSIVE METABOLIC PANEL   Collection Time    07/26/13  9:05 AM      Result Value Ref Range   Sodium 124 (*) 135 - 145 mEq/L   Potassium 3.6  3.5 - 5.1 mEq/L   Chloride 88 (*) 96 - 112 mEq/L   CO2 27  19 - 32 mEq/L   Glucose, Bld 132 (*) 70 - 99 mg/dL   BUN 11  6 - 23 mg/dL   Creatinine, Ser 0.51  0.50 - 1.10 mg/dL   Calcium 8.4  8.4 - 10.5 mg/dL   Total Protein 6.8  6.0 - 8.3 g/dL   Albumin 3.2 (*) 3.5 - 5.2 g/dL   AST 43 (*) 0 - 37 U/L   ALT 27  0 - 35 U/L   Alkaline Phosphatase 71  39 - 117 U/L   Total Bilirubin 0.5  0.3 - 1.2 mg/dL   GFR calc non Af Amer >90  >90 mL/min   GFR calc Af Amer >90  >90 mL/min  GLUCOSE, CAPILLARY   Collection Time    07/26/13 11:56  AM      Result Value Ref Range   Glucose-Capillary 118 (*) 70 - 99 mg/dL  CBC   Collection Time    07/26/13 12:00 PM      Result Value Ref Range   WBC 10.0  4.0 - 10.5 K/uL   RBC 4.29  3.87 - 5.11 MIL/uL   Hemoglobin 11.7 (*) 12.0 - 15.0 g/dL   HCT 34.7 (*) 36.0 - 46.0 %   MCV 80.9  78.0 - 100.0 fL   MCH 27.3  26.0 - 34.0 pg   MCHC 33.7  30.0 - 36.0 g/dL   RDW 13.3  11.5 - 15.5 %   Platelets 178  150 - 400 K/uL  GLUCOSE, CAPILLARY   Collection Time    07/26/13  4:37 PM      Result Value Ref Range   Glucose-Capillary 113 (*) 70 - 99 mg/dL  Results for orders placed in visit on 03/12/13 (from the past 8736 hour(s))  HEPATITIS C RNA QUANTITATIVE   Collection Time    03/12/13  4:38 PM      Result Value Ref Range   HCV Quantitative [id number] (*) <15 IU/mL   HCV Quantitative Log 6.82 (*) <1.18 log 10  COMPREHENSIVE METABOLIC PANEL   Collection Time    03/12/13  4:38 PM      Result Value Ref Range   Sodium 137  135 - 145 mEq/L   Potassium 4.3  3.5 - 5.3 mEq/L   Chloride 99  96 - 112 mEq/L   CO2 30  19 - 32 mEq/L   Glucose, Bld 99  70 - 99 mg/dL   BUN 17  6 - 23 mg/dL   Creat 0.72  0.50 - 1.10 mg/dL   Total Bilirubin 0.5  0.3 - 1.2 mg/dL   Alkaline Phosphatase  70  39 - 117 U/L   AST 27  0 - 37 U/L   ALT 31  0 - 35 U/L   Total Protein 7.2  6.0 - 8.3 g/dL   Albumin 4.1  3.5 - 5.2 g/dL   Calcium 9.7  8.4 - 10.5 mg/dL  Family doctor is following her labs  Assessment Axis I Maj. depressive disorder, insomnia, pain issues, HIstory of alcoholism. Axis II deferred Axis III see medical history Axis IV mild to moderate  Plan: She will continue Cymbalta but add Effexor XR 75 mg every morning. She can still continue Risperdal and use lorazepam 0.5 mg each bedtime. She is stop the Xanax and Minipress she feels any worse she can call here or go to the emergency room or call 911  Explain risk and benefits of the medication and recommend to call us back if she has any question or concern.  Followup in 4 weeks  MEDICATIONS this encounter: Meds ordered this encounter  Medications  . venlafaxine XR (EFFEXOR XR) 75 MG 24 hr capsule    Sig: Take 1 capsule (75 mg total) by mouth daily.    Dispense:  30 capsule    Refill:  2  . DULoxetine (CYMBALTA) 60 MG capsule    Sig: Take 1 capsule (60 mg total) by mouth daily.    Dispense:  30 capsule    Refill:  2    30 day supply  . risperiDONE (RISPERDAL) 0.5 MG tablet    Sig: Take 1 tablet (0.5 mg total) by mouth 2 (two) times daily.    Dispense:  60 tablet    Refill:  2  . LORazepam (ATIVAN)  0.5 MG tablet    Sig: Take 1 tablet (0.5 mg total) by mouth at bedtime.    Dispense:  30 tablet    Refill:  2    Medical Decision Making Problem Points:  Established problem, worsening (2), Review of last therapy session (1) and Review of psycho-social stressors (1) Data Points:  Review or order clinical lab tests (1) Review of medication regiment & side effects (2) Review of new medications or change in dosage (2)  I certify that outpatient services furnished can reasonably be expected to improve the patient's condition.   Levonne Spiller, MD

## 2014-02-07 ENCOUNTER — Ambulatory Visit (INDEPENDENT_AMBULATORY_CARE_PROVIDER_SITE_OTHER): Payer: Medicare Other | Admitting: Psychology

## 2014-02-07 DIAGNOSIS — F39 Unspecified mood [affective] disorder: Secondary | ICD-10-CM

## 2014-02-07 DIAGNOSIS — F419 Anxiety disorder, unspecified: Secondary | ICD-10-CM

## 2014-02-07 DIAGNOSIS — F411 Generalized anxiety disorder: Secondary | ICD-10-CM

## 2014-02-11 ENCOUNTER — Ambulatory Visit (HOSPITAL_COMMUNITY): Payer: Self-pay | Admitting: Psychiatry

## 2014-02-13 ENCOUNTER — Encounter (HOSPITAL_COMMUNITY): Payer: Self-pay | Admitting: Psychology

## 2014-02-13 NOTE — Progress Notes (Signed)
    PROGRESS NOTE   Patient:  Lauren [name]   DOB: Feb 06, 1951  MR Number: [id number]  Location: [facility] [address] [place] [address] Dept: [phone]  Start: 4 PM End: 5 PM  Provider/Observer:     Edgardo Roys PSYD  Chief Complaint:      Chief Complaint  Patient presents with  . Depression  . Anxiety  . Stress    Reason For Service:     The patient has a long history of major depressive condition and ongoing underlying depressive disorder. She is a recovering alcoholic but has many many years this Friday. Significant symptoms of anxiety have played a major role in substance abuse in the past. Significant medical problems and her inability to work have complicated her situation. When all these difficulties are put in the context and relationship to potential workability it is clear that she is disabled not do not expect her to return to gainful employment.  Interventions Strategy:  Cognitive/behavioral psychotherapy  Participation Level:   Active  Participation Quality:  Appropriate      Behavioral Observation:  Well Groomed, Alert, and Appropriate.   Current Psychosocial Factors: The patient reports that as expected she has been having a great deal of struggle with the fact that she is no longer been able to work. She reports that she even struggle to see if she can continue to work and even less reduce hours and initially placed she was working agreed but she had more trouble and they mutually decided that even this was not at work. The patient has been quite agitated now on by the situation.  Content of Session:   Review current symptoms and continue to work on therapeutic interventions for improved coping skills and strategies.  Current Status:   The patient reports that she is still having some fatigue and thinks some of there medications may be playing a role, although  when she has tried to reduce her anxiety becomes overwhelming..  Patient Progress:   Stable and improving  Target Goals:   Reduce the frequency of depressive events including feelings of hopelessness and helplessness, anhedonia, social withdrawal, as well as significant anxiety symptoms related to social avoidance, fear of abandonment and fear of inability to manage and cope with her world. We will also look at continuing complete sobriety.  Last Reviewed:   02/07/2014   Goals Addressed Today:    Target goals addressed today have to do with issues of depression in particular feelings of helplessness and hopelessness.   Impression/Diagnosis:   The patient has a long history of depressive disorder and underlying anxiety disorder. She has had numerous medical issues including severe fibromyalgia and severe arthritis. She has been diagnosed with significant arthritis throughout her body and in particular her hands and her feet. Numerous events and left are overwhelmed and unable to work. The patient has had periodic times of improvement for primary symptoms but there are other times where the symptoms are so problematic that she has not been able to maintain gainful employment.  Diagnosis:    Axis I:  Mood disorder  Anxiety    Jenne Sellinger R, PsyD 02/13/2014

## 2014-02-24 ENCOUNTER — Encounter (HOSPITAL_COMMUNITY): Payer: Self-pay | Admitting: Psychiatry

## 2014-02-24 ENCOUNTER — Ambulatory Visit (INDEPENDENT_AMBULATORY_CARE_PROVIDER_SITE_OTHER): Payer: Medicare Other | Admitting: Psychiatry

## 2014-02-24 VITALS — BP 110/70 | Ht 66.0 in | Wt 145.0 lb

## 2014-02-24 DIAGNOSIS — F1021 Alcohol dependence, in remission: Secondary | ICD-10-CM

## 2014-02-24 DIAGNOSIS — F329 Major depressive disorder, single episode, unspecified: Secondary | ICD-10-CM

## 2014-02-24 DIAGNOSIS — G4700 Insomnia, unspecified: Secondary | ICD-10-CM

## 2014-02-24 DIAGNOSIS — F39 Unspecified mood [affective] disorder: Secondary | ICD-10-CM

## 2014-02-24 DIAGNOSIS — R52 Pain, unspecified: Secondary | ICD-10-CM

## 2014-02-24 MED ORDER — DULOXETINE HCL 30 MG PO CPEP: 30.0000 mg | ORAL_CAPSULE | Freq: Every day | ORAL | Status: DC

## 2014-02-24 MED ORDER — RISPERIDONE 1 MG PO TABS: 1.0000 mg | ORAL_TABLET | Freq: Two times a day (BID) | ORAL | Status: DC

## 2014-02-24 MED ORDER — VENLAFAXINE HCL ER 150 MG PO CP24: 150.0000 mg | ORAL_CAPSULE | Freq: Every day | ORAL | Status: DC

## 2014-02-24 MED ORDER — LORAZEPAM 0.5 MG PO TABS: 0.5000 mg | ORAL_TABLET | Freq: Every day | ORAL | Status: DC

## 2014-02-24 MED ORDER — GABAPENTIN 300 MG PO CAPS: 300.0000 mg | ORAL_CAPSULE | Freq: Three times a day (TID) | ORAL | Status: DC

## 2014-02-24 NOTE — Progress Notes (Signed)
Patient ID: Lauren [name], female   DOB: June 08, 1951, 63 y.o.   MRN: [id number] Patient ID: Lauren [name], female   DOB: 02-22-1951, 63 y.o.   MRN: [id number] Patient ID: Lauren [name], female   DOB: 16-Feb-1951, 63 y.o.   MRN: [id number] Patient ID: Lauren [name], female   DOB: 02-Apr-1951, 63 y.o.   MRN: [id number] Patient ID: Lauren [name], female   DOB: 1950/10/31, 63 y.o.   MRN: [id number] Patient ID: Lauren [name], female   DOB: 1951/02/26, 63 y.o.   MRN: [id number] [place] [address] Progress Note Lauren [name] MRN: [id number] DOB: 1951-06-02 Age: 63 y.o.  Date: 02/24/2014  Chief Complaint: Chief Complaint  Patient presents with  . Anxiety  . Depression  . Follow-up   Subjective: "I'm still depressed."  History of presenting illness This patient is a 63 year old married white female who lives with her husband. She has one stepchild. Her step son killed himself 7 years ago at age 49. He was an alcoholic. 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  The patient has a long history of depression. She was last hospitalized in 2010 after she took too many pain pills and got confused. She use to drink heavily but has been sober 23 years and is very active in [employer] and church. For the most part she feels her mood is stable. She sleeping pretty well. Her energy is good. She has a hard time functioning without Ativan but she only usually takes one pill a day side think this is reasonable. She tried a higher dose of Cymbalta but it made her manic and revved up  The patient returns after 2 months. She brought her husband with her this time. She states that she's been very depressed lately and she doesn't know exactly why. Her husband states that she ruminates and is extremely wrapped up in negative thoughts. She constantly put herself down and compares herself to others. She's not been going to [employer]  recently. She's been sleeping okay but cries all the time and has had suicidal thoughts but no specific plan. She recently quit her job so she should have more time for meetings. She constantly thinks people are judging her crit taking her and her thoughts almost border on being paranoid. I told her we should probably increase her Risperdal to help with this as well as increasing the Effexor and diminishing the Cymbalta.  Allergies: Allergies  Allergen Reactions  . Elavil [Amitriptyline] Other (See Comments)    Vivid dreams and almost suicidal  . Flagyl [Metronidazole] Itching  . Vistaril [Hydroxyzine Hcl] Other (See Comments)    Got higher than a kite on too much of this.  Lindajo Royal [Ziprasidone Hydrochloride]   . Lactose Intolerance (Gi)   Medical History: Past Medical History  Diagnosis Date  . Depression   . Osteoarthritis   . Fibromyalgia   . IBS (irritable bowel syndrome)   . GERD (gastroesophageal reflux disease)   . Prediabetes   . Chronic pain   Surgical History: Past Surgical History  Procedure Laterality Date  . Tonsillectomy    . Cholecystectomy    . Colonoscopy    . Upper gastrointestinal endoscopy    . Mandible surgery  09/06/1979  . Orif wrist fracture Right 07/23/2013    Procedure: OPEN REDUCTION INTERNAL FIXATION (ORIF) WRIST FRACTURE;  Surgeon: Roseanne Kaufman, MD;  Location: [facility];  Service: Orthopedics;  Laterality: Right;  Family History family history includes Alcohol abuse in her cousin, father, maternal grandfather, maternal grandmother, paternal grandfather, paternal grandmother, and paternal uncle; Anxiety disorder in her maternal uncle and paternal uncle; Depression in her father; Diabetes in her brother, brother, and father; Healthy in her brother; Heart disease in her brother; Irritable bowel syndrome in her mother; OCD in her brother and brother; Sexual abuse in her mother; Stroke in her father and mother. There is no history of ADD / ADHD, Bipolar disorder,  Dementia, Drug abuse, Paranoid behavior, Schizophrenia, Seizures, or Physical abuse. Reviewed al this again to day in the appointment and nothing has changed  Mental status examination Patient is fairly groomed and dressedShe is anxious depressed and tearful .  She admits to passive suicidal thoughts but denies homicidal thoughts.  She denies paranoia and she denies any hallucination. Her attention and concentration is fair. She is quite obsessed about what other people think of her  She's alert and oriented x3 and her insight judgment and impulse control is okay. .   Lab Results:  Results for orders placed during the hospital encounter of 07/23/13 (from the past 8736 hour(s))  POCT I-STAT, CHEM 8   Collection Time    07/23/13 10:27 AM      Result Value Ref Range   Sodium 140  135 - 145 mEq/L   Potassium 4.5  3.5 - 5.1 mEq/L   Chloride 101  96 - 112 mEq/L   BUN 20  6 - 23 mg/dL   Creatinine, Ser 0.90  0.50 - 1.10 mg/dL   Glucose, Bld 123 (*) 70 - 99 mg/dL   Calcium, Ion 1.26  1.13 - 1.30 mmol/L   TCO2 29  0 - 100 mmol/L   Hemoglobin 14.6  12.0 - 15.0 g/dL   HCT 43.0  36.0 - 46.0 %  CBC WITH DIFFERENTIAL   Collection Time    07/23/13  2:41 PM      Result Value Ref Range   WBC 9.2  4.0 - 10.5 K/uL   RBC 4.85  3.87 - 5.11 MIL/uL   Hemoglobin 13.0  12.0 - 15.0 g/dL   HCT 40.9  36.0 - 46.0 %   MCV 84.3  78.0 - 100.0 fL   MCH 26.8  26.0 - 34.0 pg   MCHC 31.8  30.0 - 36.0 g/dL   RDW 13.6  11.5 - 15.5 %   Platelets 205  150 - 400 K/uL   Neutrophils Relative % 76  43 - 77 %   Neutro Abs 7.0  1.7 - 7.7 K/uL   Lymphocytes Relative 14  12 - 46 %   Lymphs Abs 1.3  0.7 - 4.0 K/uL   Monocytes Relative 7  3 - 12 %   Monocytes Absolute 0.6  0.1 - 1.0 K/uL   Eosinophils Relative 2  0 - 5 %   Eosinophils Absolute 0.2  0.0 - 0.7 K/uL   Basophils Relative 0  0 - 1 %   Basophils Absolute 0.0  0.0 - 0.1 K/uL  BASIC METABOLIC PANEL   Collection Time    07/23/13  2:41 PM      Result Value Ref  Range   Sodium 140  135 - 145 mEq/L   Potassium 4.2  3.5 - 5.1 mEq/L   Chloride 100  96 - 112 mEq/L   CO2 32  19 - 32 mEq/L   Glucose, Bld 100 (*) 70 - 99 mg/dL   BUN 15  6 -  23 mg/dL   Creatinine, Ser 0.64  0.50 - 1.10 mg/dL   Calcium 9.8  8.4 - 10.5 mg/dL   GFR calc non Af Amer >90  >90 mL/min   GFR calc Af Amer >90  >90 mL/min  GLUCOSE, CAPILLARY   Collection Time    07/23/13 10:30 PM      Result Value Ref Range   Glucose-Capillary 112 (*) 70 - 99 mg/dL  GLUCOSE, CAPILLARY   Collection Time    07/24/13  7:28 AM      Result Value Ref Range   Glucose-Capillary 113 (*) 70 - 99 mg/dL  GLUCOSE, CAPILLARY   Collection Time    07/24/13 11:54 AM      Result Value Ref Range   Glucose-Capillary 121 (*) 70 - 99 mg/dL  GLUCOSE, CAPILLARY   Collection Time    07/24/13  2:50 PM      Result Value Ref Range   Glucose-Capillary 152 (*) 70 - 99 mg/dL  GLUCOSE, CAPILLARY   Collection Time    07/24/13  4:23 PM      Result Value Ref Range   Glucose-Capillary 138 (*) 70 - 99 mg/dL   Comment 1 Documented in Chart     Comment 2 Notify RN    COMPREHENSIVE METABOLIC PANEL   Collection Time    07/24/13  4:58 PM      Result Value Ref Range   Sodium 129 (*) 135 - 145 mEq/L   Potassium 4.2  3.5 - 5.1 mEq/L   Chloride 90 (*) 96 - 112 mEq/L   CO2 29  19 - 32 mEq/L   Glucose, Bld 144 (*) 70 - 99 mg/dL   BUN 10  6 - 23 mg/dL   Creatinine, Ser 0.61  0.50 - 1.10 mg/dL   Calcium 8.8  8.4 - 10.5 mg/dL   Total Protein 7.2  6.0 - 8.3 g/dL   Albumin 3.5  3.5 - 5.2 g/dL   AST 37  0 - 37 U/L   ALT 25  0 - 35 U/L   Alkaline Phosphatase 76  39 - 117 U/L   Total Bilirubin 0.6  0.3 - 1.2 mg/dL   GFR calc non Af Amer >90  >90 mL/min   GFR calc Af Amer >90  >90 mL/min  CBC   Collection Time    07/24/13  4:58 PM      Result Value Ref Range   WBC 8.9  4.0 - 10.5 K/uL   RBC 4.29  3.87 - 5.11 MIL/uL   Hemoglobin 11.6 (*) 12.0 - 15.0 g/dL   HCT 35.8 (*) 36.0 - 46.0 %   MCV 83.4  78.0 - 100.0 fL   MCH  27.0  26.0 - 34.0 pg   MCHC 32.4  30.0 - 36.0 g/dL   RDW 13.7  11.5 - 15.5 %   Platelets 167  150 - 400 K/uL  GLUCOSE, CAPILLARY   Collection Time    07/24/13  9:44 PM      Result Value Ref Range   Glucose-Capillary 131 (*) 70 - 99 mg/dL  CBC   Collection Time    07/25/13  3:43 AM      Result Value Ref Range   WBC 8.4  4.0 - 10.5 K/uL   RBC 4.30  3.87 - 5.11 MIL/uL   Hemoglobin 11.7 (*) 12.0 - 15.0 g/dL   HCT 34.8 (*) 36.0 - 46.0 %   MCV 80.9  78.0 - 100.0 fL   MCH 27.2  26.0 - 34.0 pg   MCHC 33.6  30.0 - 36.0 g/dL   RDW 13.0  11.5 - 15.5 %   Platelets 174  150 - 400 K/uL  COMPREHENSIVE METABOLIC PANEL   Collection Time    07/25/13  3:43 AM      Result Value Ref Range   Sodium 124 (*) 135 - 145 mEq/L   Potassium 3.9  3.5 - 5.1 mEq/L   Chloride 87 (*) 96 - 112 mEq/L   CO2 28  19 - 32 mEq/L   Glucose, Bld 108 (*) 70 - 99 mg/dL   BUN 8  6 - 23 mg/dL   Creatinine, Ser 0.50  0.50 - 1.10 mg/dL   Calcium 8.7  8.4 - 10.5 mg/dL   Total Protein 6.8  6.0 - 8.3 g/dL   Albumin 3.2 (*) 3.5 - 5.2 g/dL   AST 34  0 - 37 U/L   ALT 22  0 - 35 U/L   Alkaline Phosphatase 72  39 - 117 U/L   Total Bilirubin 0.7  0.3 - 1.2 mg/dL   GFR calc non Af Amer >90  >90 mL/min   GFR calc Af Amer >90  >90 mL/min  GLUCOSE, CAPILLARY   Collection Time    07/25/13  6:47 AM      Result Value Ref Range   Glucose-Capillary 119 (*) 70 - 99 mg/dL  GLUCOSE, CAPILLARY   Collection Time    07/25/13 11:17 AM      Result Value Ref Range   Glucose-Capillary 124 (*) 70 - 99 mg/dL   Comment 1 Documented in Chart     Comment 2 Notify RN    GLUCOSE, CAPILLARY   Collection Time    07/25/13 12:28 PM      Result Value Ref Range   Glucose-Capillary 167 (*) 70 - 99 mg/dL  GLUCOSE, CAPILLARY   Collection Time    07/25/13  4:26 PM      Result Value Ref Range   Glucose-Capillary 114 (*) 70 - 99 mg/dL   Comment 1 Documented in Chart     Comment 2 Notify RN    GLUCOSE, CAPILLARY   Collection Time    07/25/13   9:09 PM      Result Value Ref Range   Glucose-Capillary 144 (*) 70 - 99 mg/dL  GLUCOSE, CAPILLARY   Collection Time    07/26/13  7:07 AM      Result Value Ref Range   Glucose-Capillary 131 (*) 70 - 99 mg/dL  COMPREHENSIVE METABOLIC PANEL   Collection Time    07/26/13  9:05 AM      Result Value Ref Range   Sodium 124 (*) 135 - 145 mEq/L   Potassium 3.6  3.5 - 5.1 mEq/L   Chloride 88 (*) 96 - 112 mEq/L   CO2 27  19 - 32 mEq/L   Glucose, Bld 132 (*) 70 - 99 mg/dL   BUN 11  6 - 23 mg/dL   Creatinine, Ser 0.51  0.50 - 1.10 mg/dL   Calcium 8.4  8.4 - 10.5 mg/dL   Total Protein 6.8  6.0 - 8.3 g/dL   Albumin 3.2 (*) 3.5 - 5.2 g/dL   AST 43 (*) 0 - 37 U/L   ALT 27  0 - 35 U/L   Alkaline Phosphatase 71  39 - 117 U/L   Total Bilirubin 0.5  0.3 - 1.2 mg/dL   GFR calc non Af Amer >90  >90 mL/min  GFR calc Af Amer >90  >90 mL/min  GLUCOSE, CAPILLARY   Collection Time    07/26/13 11:56 AM      Result Value Ref Range   Glucose-Capillary 118 (*) 70 - 99 mg/dL  CBC   Collection Time    07/26/13 12:00 PM      Result Value Ref Range   WBC 10.0  4.0 - 10.5 K/uL   RBC 4.29  3.87 - 5.11 MIL/uL   Hemoglobin 11.7 (*) 12.0 - 15.0 g/dL   HCT 34.7 (*) 36.0 - 46.0 %   MCV 80.9  78.0 - 100.0 fL   MCH 27.3  26.0 - 34.0 pg   MCHC 33.7  30.0 - 36.0 g/dL   RDW 13.3  11.5 - 15.5 %   Platelets 178  150 - 400 K/uL  GLUCOSE, CAPILLARY   Collection Time    07/26/13  4:37 PM      Result Value Ref Range   Glucose-Capillary 113 (*) 70 - 99 mg/dL  Results for orders placed in visit on 03/12/13 (from the past 8736 hour(s))  HEPATITIS C RNA QUANTITATIVE   Collection Time    03/12/13  4:38 PM      Result Value Ref Range   HCV Quantitative [id number] (*) <15 IU/mL   HCV Quantitative Log 6.82 (*) <1.18 log 10  COMPREHENSIVE METABOLIC PANEL   Collection Time    03/12/13  4:38 PM      Result Value Ref Range   Sodium 137  135 - 145 mEq/L   Potassium 4.3  3.5 - 5.3 mEq/L   Chloride 99  96 - 112 mEq/L    CO2 30  19 - 32 mEq/L   Glucose, Bld 99  70 - 99 mg/dL   BUN 17  6 - 23 mg/dL   Creat 0.72  0.50 - 1.10 mg/dL   Total Bilirubin 0.5  0.3 - 1.2 mg/dL   Alkaline Phosphatase 70  39 - 117 U/L   AST 27  0 - 37 U/L   ALT 31  0 - 35 U/L   Total Protein 7.2  6.0 - 8.3 g/dL   Albumin 4.1  3.5 - 5.2 g/dL   Calcium 9.7  8.4 - 10.5 mg/dL  Family doctor is following her labs  Assessment Axis I Maj. depressive disorder, insomnia, pain issues, HIstory of alcoholism. Axis II deferred Axis III see medical history Axis IV mild to moderate  Plan: She will she will decrease Cymbalta 30 mg every morning increase Effexor XR to 150 mg every morning She will increase Risperdal to 1 mg twice a day and use lorazepam 0.5 mg each bedtime.If  she feels any worse she can call here or go to the emergency room or call 911  Explain risk and benefits of the medication and recommend to call us back if she has any question or concern.  Followup in 3 weeks  MEDICATIONS this encounter: Meds ordered this encounter  Medications  . risperiDONE (RISPERDAL) 1 MG tablet    Sig: Take 1 tablet (1 mg total) by mouth 2 (two) times daily.    Dispense:  60 tablet    Refill:  2  . venlafaxine XR (EFFEXOR XR) 150 MG 24 hr capsule    Sig: Take 1 capsule (150 mg total) by mouth daily with breakfast.    Dispense:  30 capsule    Refill:  2  . DULoxetine (CYMBALTA) 30 MG capsule    Sig: Take 1 capsule (30 mg  total) by mouth daily.    Dispense:  30 capsule    Refill:  2  . gabapentin (NEURONTIN) 300 MG capsule    Sig: Take 1 capsule (300 mg total) by mouth 3 (three) times daily.    Dispense:  90 capsule    Refill:  2  . LORazepam (ATIVAN) 0.5 MG tablet    Sig: Take 1 tablet (0.5 mg total) by mouth at bedtime.    Dispense:  30 tablet    Refill:  2    Medical Decision Making Problem Points:  Established problem, worsening (2), Review of last therapy session (1) and Review of psycho-social stressors (1) Data Points:  Review or  order clinical lab tests (1) Review of medication regiment & side effects (2) Review of new medications or change in dosage (2)  I certify that outpatient services furnished can reasonably be expected to improve the patient's condition.   Levonne Spiller, MD

## 2014-03-17 ENCOUNTER — Encounter (HOSPITAL_COMMUNITY): Payer: Self-pay | Admitting: Psychiatry

## 2014-03-17 ENCOUNTER — Encounter (HOSPITAL_COMMUNITY): Payer: Self-pay | Admitting: Emergency Medicine

## 2014-03-17 ENCOUNTER — Emergency Department (HOSPITAL_COMMUNITY)
Admission: EM | Admit: 2014-03-17 | Discharge: 2014-03-18 | Disposition: A | Discharge status: Other Acute Inpatient Hospital | Payer: Medicare Other | Attending: Emergency Medicine | Admitting: Emergency Medicine

## 2014-03-17 ENCOUNTER — Ambulatory Visit (INDEPENDENT_AMBULATORY_CARE_PROVIDER_SITE_OTHER): Payer: Medicare Other | Admitting: Psychiatry

## 2014-03-17 VITALS — BP 122/80 | Ht 66.0 in | Wt 141.0 lb

## 2014-03-17 DIAGNOSIS — K219 Gastro-esophageal reflux disease without esophagitis: Secondary | ICD-10-CM | POA: Diagnosis not present

## 2014-03-17 DIAGNOSIS — F39 Unspecified mood [affective] disorder: Secondary | ICD-10-CM

## 2014-03-17 DIAGNOSIS — Z79899 Other long term (current) drug therapy: Secondary | ICD-10-CM | POA: Insufficient documentation

## 2014-03-17 DIAGNOSIS — F3289 Other specified depressive episodes: Secondary | ICD-10-CM | POA: Insufficient documentation

## 2014-03-17 DIAGNOSIS — R197 Diarrhea, unspecified: Secondary | ICD-10-CM | POA: Insufficient documentation

## 2014-03-17 DIAGNOSIS — F329 Major depressive disorder, single episode, unspecified: Secondary | ICD-10-CM | POA: Insufficient documentation

## 2014-03-17 DIAGNOSIS — M1990 Unspecified osteoarthritis, unspecified site: Secondary | ICD-10-CM | POA: Insufficient documentation

## 2014-03-17 DIAGNOSIS — Z87891 Personal history of nicotine dependence: Secondary | ICD-10-CM | POA: Diagnosis not present

## 2014-03-17 DIAGNOSIS — F411 Generalized anxiety disorder: Secondary | ICD-10-CM | POA: Diagnosis not present

## 2014-03-17 DIAGNOSIS — R45851 Suicidal ideations: Secondary | ICD-10-CM | POA: Insufficient documentation

## 2014-03-17 DIAGNOSIS — G8929 Other chronic pain: Secondary | ICD-10-CM | POA: Diagnosis not present

## 2014-03-17 DIAGNOSIS — Z046 Encounter for general psychiatric examination, requested by authority: Secondary | ICD-10-CM | POA: Diagnosis present

## 2014-03-17 DIAGNOSIS — IMO0001 Reserved for inherently not codable concepts without codable children: Secondary | ICD-10-CM | POA: Insufficient documentation

## 2014-03-17 DIAGNOSIS — F32A Depression, unspecified: Secondary | ICD-10-CM

## 2014-03-17 LAB — CBC WITH DIFFERENTIAL/PLATELET
Basophils Absolute: 0 10*3/uL (ref 0.0–0.1)
Basophils Relative: 0 % (ref 0–1)
EOS ABS: 0.1 10*3/uL (ref 0.0–0.7)
Eosinophils Relative: 2 % (ref 0–5)
HEMATOCRIT: 42.7 % (ref 36.0–46.0)
Hemoglobin: 14.1 g/dL (ref 12.0–15.0)
LYMPHS ABS: 1.6 10*3/uL (ref 0.7–4.0)
Lymphocytes Relative: 26 % (ref 12–46)
MCH: 28.1 pg (ref 26.0–34.0)
MCHC: 33 g/dL (ref 30.0–36.0)
MCV: 85.1 fL (ref 78.0–100.0)
MONO ABS: 0.5 10*3/uL (ref 0.1–1.0)
MONOS PCT: 8 % (ref 3–12)
Neutro Abs: 3.8 10*3/uL (ref 1.7–7.7)
Neutrophils Relative %: 64 % (ref 43–77)
Platelets: 214 10*3/uL (ref 150–400)
RBC: 5.02 MIL/uL (ref 3.87–5.11)
RDW: 13.3 % (ref 11.5–15.5)
WBC: 6 10*3/uL (ref 4.0–10.5)

## 2014-03-17 LAB — URINALYSIS, ROUTINE W REFLEX MICROSCOPIC
Bilirubin Urine: NEGATIVE
GLUCOSE, UA: NEGATIVE mg/dL
HGB URINE DIPSTICK: NEGATIVE
Ketones, ur: NEGATIVE mg/dL
Leukocytes, UA: NEGATIVE
Nitrite: NEGATIVE
Protein, ur: NEGATIVE mg/dL
Specific Gravity, Urine: <1.005 — ABNORMAL LOW (ref 1.005–1.030)
Urobilinogen, UA: 0.2 mg/dL (ref 0.0–1.0)
pH: 6.5 (ref 5.0–8.0)

## 2014-03-17 LAB — BASIC METABOLIC PANEL
Anion gap: 10 (ref 5–15)
BUN: 10 mg/dL (ref 6–23)
CO2: 29 mEq/L (ref 19–32)
CREATININE: 0.55 mg/dL (ref 0.50–1.10)
Calcium: 10 mg/dL (ref 8.4–10.5)
Chloride: 99 mEq/L (ref 96–112)
GFR calc Af Amer: >90 mL/min (ref >90)
GLUCOSE: 122 mg/dL — AB (ref 70–99)
POTASSIUM: 4.4 meq/L (ref 3.7–5.3)
Sodium: 138 mEq/L (ref 137–147)

## 2014-03-17 LAB — RAPID URINE DRUG SCREEN, HOSP PERFORMED
Amphetamines: NOT DETECTED
BARBITURATES: NOT DETECTED
Benzodiazepines: NOT DETECTED
Cocaine: NOT DETECTED
OPIATES: NOT DETECTED
TETRAHYDROCANNABINOL: NOT DETECTED

## 2014-03-17 LAB — ETHANOL: Alcohol, Ethyl (B): <11 mg/dL (ref 0–11)

## 2014-03-17 MED ORDER — ZOLPIDEM TARTRATE 5 MG PO TABS
5.0000 mg | ORAL_TABLET | Freq: Every evening | ORAL | Status: DC | PRN
  Administered 2014-03-17: 5 mg via ORAL
  Filled 2014-03-17: qty 1

## 2014-03-17 MED ORDER — LOPERAMIDE HCL 2 MG PO CAPS
4.0000 mg | ORAL_CAPSULE | ORAL | Status: DC | PRN
  Administered 2014-03-17: 4 mg via ORAL
  Filled 2014-03-17: qty 2

## 2014-03-17 MED ORDER — RISPERIDONE 1 MG PO TABS
1.0000 mg | ORAL_TABLET | Freq: Every day | ORAL | Status: DC
  Administered 2014-03-17: 1 mg via ORAL
  Filled 2014-03-17 (×2): qty 1

## 2014-03-17 MED ORDER — ACETAMINOPHEN 325 MG PO TABS: 650.0000 mg | ORAL_TABLET | ORAL | Status: DC | PRN

## 2014-03-17 MED ORDER — SIMVASTATIN 10 MG PO TABS
10.0000 mg | ORAL_TABLET | Freq: Every day | ORAL | Status: DC
  Filled 2014-03-17: qty 1

## 2014-03-17 MED ORDER — NICOTINE 21 MG/24HR TD PT24: 21.0000 mg | MEDICATED_PATCH | Freq: Every day | TRANSDERMAL | Status: DC | PRN

## 2014-03-17 MED ORDER — METHOCARBAMOL 500 MG PO TABS
500.0000 mg | ORAL_TABLET | Freq: Every day | ORAL | Status: DC
  Administered 2014-03-17: 500 mg via ORAL
  Filled 2014-03-17: qty 1

## 2014-03-17 MED ORDER — ALUM & MAG HYDROXIDE-SIMETH 200-200-20 MG/5ML PO SUSP: 30.0000 mL | ORAL | Status: DC | PRN

## 2014-03-17 MED ORDER — ONDANSETRON HCL 4 MG PO TABS: 4.0000 mg | ORAL_TABLET | Freq: Three times a day (TID) | ORAL | Status: DC | PRN

## 2014-03-17 MED ORDER — DULOXETINE HCL 30 MG PO CPEP
30.0000 mg | ORAL_CAPSULE | Freq: Every day | ORAL | Status: DC
  Administered 2014-03-17: 30 mg via ORAL
  Filled 2014-03-17 (×4): qty 1

## 2014-03-17 MED ORDER — RISPERIDONE 1 MG PO TABS
ORAL_TABLET | ORAL | Status: AC
  Filled 2014-03-17: qty 1

## 2014-03-17 MED ORDER — GABAPENTIN 300 MG PO CAPS
300.0000 mg | ORAL_CAPSULE | Freq: Three times a day (TID) | ORAL | Status: DC
  Administered 2014-03-17: 300 mg via ORAL
  Filled 2014-03-17 (×9): qty 1

## 2014-03-17 MED ORDER — ALBUTEROL SULFATE HFA 108 (90 BASE) MCG/ACT IN AERS: 2.0000 | INHALATION_SPRAY | RESPIRATORY_TRACT | Status: DC | PRN

## 2014-03-17 MED ORDER — IBUPROFEN 400 MG PO TABS: 600.0000 mg | ORAL_TABLET | Freq: Three times a day (TID) | ORAL | Status: DC | PRN

## 2014-03-17 MED ORDER — METFORMIN HCL ER 500 MG PO TB24
500.0000 mg | ORAL_TABLET | Freq: Every day | ORAL | Status: DC
  Filled 2014-03-17 (×3): qty 1

## 2014-03-17 NOTE — BH Assessment (Signed)
Assessment Note  Lauren [name] is an 63 y.o. female who presents to [facility] at the recommendation of her psychiatrist, Lauren [name], for evaluation of chronic suicidal thoughts.  [name] reports she's experienced multiple recent losses including her ability work outside the home or in it due to her fibromyalgia.  She also reports that since their move to [place] she's lost touch with the recovery community that was so important to her and her 24 year sobriety.  She reports that she's been talking about suicide and death a lot and her husband is concerned.  She says she's been feeling worthless, irritable, tired, isolating herself, crying, had decreased appetite, concentration and memory, and been experiencing feelings of guilt. She also has panic attacks at least daily.  She states that she has thought of overdosing on her pills, specifically of taking a handful or lorazepam.  She reports that her father got older and sickly and constantly stated that he wanted to die.  Her brother quit dialysis at age 28 because he wanted to die and when he did die her mother began vocalizing her own desire to die.  She reports that just feels like what you do when you get older.  She says that she's beginning to forget things and "I feel ike I'd be better off dead If I didn't remember what's going on.  I just think my husband would be better."  She reports she does not have any thoughts of harming anyone else or have any AVH.  She does report that their are firearms in the closet in her home.  She would like to go home and handle her treatment outpatient, but this writer explained that her psychiatrist felt she needed more support at this time.  Consulted with Lauren [name], [facility] NP, who is in agreement with the disposition for inpatient treatment.    Axis I: Depressive Disorder NOS and Generalized Anxiety Disorder Axis II: Deferred Axis III:  Past Medical History  Diagnosis Date  . Depression   . Osteoarthritis   .  Fibromyalgia   . IBS (irritable bowel syndrome)   . GERD (gastroesophageal reflux disease)   . Prediabetes   . Chronic pain    Axis IV: occupational problems, problems related to social environment and problems with primary support group Axis V: 41-50 serious symptoms  Past Medical History:  Past Medical History  Diagnosis Date  . Depression   . Osteoarthritis   . Fibromyalgia   . IBS (irritable bowel syndrome)   . GERD (gastroesophageal reflux disease)   . Prediabetes   . Chronic pain     Past Surgical History  Procedure Laterality Date  . Tonsillectomy    . Cholecystectomy    . Colonoscopy    . Upper gastrointestinal endoscopy    . Mandible surgery  09/06/1979  . Orif wrist fracture Right 07/23/2013    Procedure: OPEN REDUCTION INTERNAL FIXATION (ORIF) WRIST FRACTURE;  Surgeon: Roseanne Kaufman, MD;  Location: [facility];  Service: Orthopedics;  Laterality: Right;    Family History:  Family History  Problem Relation Age of Onset  . Alcohol abuse Father   . Diabetes Father   . Stroke Father   . Depression Father   . Anxiety disorder Paternal Uncle   . Alcohol abuse Paternal Uncle   . Alcohol abuse Maternal Grandfather   . Alcohol abuse Maternal Grandmother   . Alcohol abuse Paternal Grandfather   . Alcohol abuse Paternal Grandmother   . Alcohol abuse Cousin   .  Anxiety disorder Maternal Uncle   . Stroke Mother   . Irritable bowel syndrome Mother   . Sexual abuse Mother   . Diabetes Brother   . OCD Brother   . Healthy Brother   . OCD Brother   . Diabetes Brother   . Heart disease Brother   . ADD / ADHD Neg Hx   . Bipolar disorder Neg Hx   . Dementia Neg Hx   . Drug abuse Neg Hx   . Paranoid behavior Neg Hx   . Schizophrenia Neg Hx   . Seizures Neg Hx   . Physical abuse Neg Hx     Social History:  reports that she quit smoking about 30 years ago. She has never used smokeless tobacco. She reports that she does not drink alcohol or use illicit  drugs.  Additional Social History:  Alcohol / Drug Use Longest period of sobriety (when/how long): 24 years  CIWA: CIWA-Ar BP: 124/89 mmHg Pulse Rate: 99 COWS:    Allergies:  Allergies  Allergen Reactions  . Elavil [Amitriptyline] Other (See Comments)    Vivid dreams and almost suicidal  . Flagyl [Metronidazole] Itching  . Vistaril [Hydroxyzine Hcl] Other (See Comments)    Got higher than a kite on too much of this.  Lindajo Royal [Ziprasidone Hydrochloride]   . Lactose Intolerance (Gi)     Home Medications:  (Not in a hospital admission)  OB/GYN Status:  No LMP recorded. Patient is postmenopausal.  General Assessment Data Location of Assessment: [facility] Is this a Tele or Face-to-Face Assessment?: Face-to-Face Is this an Initial Assessment or a Re-assessment for this encounter?: Initial Assessment Living Arrangements: Spouse/significant other Can pt return to current living arrangement?: Yes Admission Status: Voluntary Is patient capable of signing voluntary admission?: Yes Transfer from: [facility] Referral Source: Self/Family/Friend     [place] Living Arrangements: Spouse/significant other Name of Psychiatrist: Margurite [name] Name of Therapist: [place]  Education Status Is patient currently in school?: No Highest grade of school patient has completed: college  Risk to self Suicidal Ideation: Yes-Currently Present Suicidal Intent: No-Not Currently/Within Last 6 Months Is patient at risk for suicide?: Yes Suicidal Plan?: Yes-Currently Present Specify Current Suicidal Plan: overdose on Lorazepam Access to Means: Yes Specify Access to Suicidal Means: Lorazepam What has been your use of drugs/alcohol within the last 12 months?: sober 24 years Previous Attempts/Gestures: Yes How many times?: 1 (20 + years ago) Triggers for Past Attempts:  (substsance abuse) Intentional Self Injurious Behavior: None Family Suicide History: Yes (step son) Recent  stressful life event(s): Loss (Comment) (ability to work, Medical laboratory scientific officer) Persecutory voices/beliefs?: No Depression: Yes Depression Symptoms: Despondent;Insomnia;Tearfulness;Isolating;Fatigue;Guilt;Feeling angry/irritable;Feeling worthless/self pity;Loss of interest in usual pleasures Substance abuse history and/or treatment for substance abuse?: No Suicide prevention information given to non-admitted patients: Not applicable  Risk to Others Homicidal Ideation: No Thoughts of Harm to Others: No Current Homicidal Intent: No Current Homicidal Plan: No Access to Homicidal Means: No History of harm to others?: No Assessment of Violence: None Noted Does patient have access to weapons?: No Criminal Charges Pending?: No Does patient have a court date: No  Psychosis Hallucinations: None noted Delusions: None noted  Mental Status Report Appear/Hygiene: Disheveled Eye Contact: Good Motor Activity: Freedom of movement Speech: Logical/coherent Level of Consciousness: Alert Mood: Depressed Affect: Appropriate to circumstance Anxiety Level: Panic Attacks Panic attack frequency: daily Most recent panic attack: today Thought Processes: Coherent;Relevant Judgement: Unimpaired Orientation: Person;Place;Time;Situation Obsessive Compulsive Thoughts/Behaviors: Minimal  Cognitive Functioning Concentration: Decreased  Memory: Recent Intact;Remote Intact IQ: Average Insight: Fair Impulse Control: Fair Appetite: Poor Weight Loss:  (unk) Weight Gain: 0 Sleep: Increased Total Hours of Sleep: 8 (and sleeping during the day) Vegetative Symptoms: Staying in bed;Decreased grooming  ADLScreening [facility] Assessment Services) Patient's cognitive ability adequate to safely complete daily activities?: Yes Patient able to express need for assistance with ADLs?: Yes Independently performs ADLs?: Yes (appropriate for developmental age)  Prior Inpatient Therapy Prior Inpatient Therapy: No  Prior  Outpatient Therapy Prior Outpatient Therapy: Yes Prior Therapy Dates: ongoing Prior Therapy Facilty/Provider(s): Dr Harrington Challenger and Dr Sima Matas Reason for Treatment: depression  ADL Screening (condition at time of admission) Patient's cognitive ability adequate to safely complete daily activities?: Yes Patient able to express need for assistance with ADLs?: Yes Independently performs ADLs?: Yes (appropriate for developmental age)         Values / Beliefs Cultural Requests During Hospitalization: None Spiritual Requests During Hospitalization: None   Advance Directives (For Healthcare) Advance Directive: Patient does not have advance directive;Patient would not like information Nutrition Screen- [facility] Patient's home diet: Regular  Additional Information 1:1 In Past 12 Months?: No CIRT Risk: No Elopement Risk: No Does patient have medical clearance?: Yes     Disposition:  Disposition Initial Assessment Completed for this Encounter: Yes Disposition of Patient: Inpatient treatment program Type of inpatient treatment program: Adult  On Site Evaluation by:   Reviewed with Physician:    Darlys Gales 03/17/2014 5:22 PM

## 2014-03-17 NOTE — ED Notes (Signed)
Personal belongings sent with husband.

## 2014-03-17 NOTE — Progress Notes (Signed)
Patient ID: Lauren [name], female   DOB: 04-30-1951, 63 y.o.   MRN: [id number] Patient ID: Lauren [name], female   DOB: [birth year], 63 y.o.   MRN: [id number] Patient ID: Lauren [name], female   DOB: 1951/02/06, 63 y.o.   MRN: [id number] Patient ID: Lauren [name], female   DOB: February 21, 1951, 63 y.o.   MRN: [id number] Patient ID: Lauren [name], female   DOB: 13-Dec-1950, 63 y.o.   MRN: [id number] Patient ID: Lauren [name], female   DOB: Jun 04, 1951, 63 y.o.   MRN: [id number] Patient ID: Lauren [name], female   DOB: 1951/06/12, 63 y.o.   MRN: [id number] [place] [address] Progress Note Lauren [name] MRN: [id number] DOB: [birth year] Age: 63 y.o.  Date: 03/17/2014  Chief Complaint: Chief Complaint  Patient presents with  . Anxiety  . Depression  . Hallucinations  . Follow-up   Subjective: "I'm  More depressed."  History of presenting illness This patient is a 63 year old married white female who lives with her husband. She has one stepchild. Her step son killed himself 7 years ago at age 41. He was an alcoholic. 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  The patient has a long history of depression. She was last hospitalized in 2010 after she took too many pain pills and got confused. She use to drink heavily but has been sober 23 years and is very active in [employer] and church. For the most part she feels her mood is stable. She sleeping pretty well. Her energy is good. She has a hard time functioning without Ativan but she only usually takes one pill a day side think this is reasonable. She tried a higher dose of Cymbalta but it made her manic and revved up  The patient returns after 3 weeks. She still not doing well. She's trying to get out and do things like go to the [employer] or attend church. However her brain never stops and she is constantly thinking negative thoughts. She feels like people are judging her and  putting her down. She feels that she doesn't deserve to live and possibly thinks about dying or committing suicide. Explained to her that these symptoms may criteria for hospitalization. She does admit that there are guns in her home but so far she has not used them because she doesn't want to hurt her husband. While here I called her husband and they both agreed to go over to the hospital ER permission to psychiatry.  Allergies: Allergies  Allergen Reactions  . Elavil [Amitriptyline] Other (See Comments)    Vivid dreams and almost suicidal  . Flagyl [Metronidazole] Itching  . Vistaril [Hydroxyzine Hcl] Other (See Comments)    Got higher than a kite on too much of this.  Lindajo Royal [Ziprasidone Hydrochloride]   . Lactose Intolerance (Gi)   Medical History: Past Medical History  Diagnosis Date  . Depression   . Osteoarthritis   . Fibromyalgia   . IBS (irritable bowel syndrome)   . GERD (gastroesophageal reflux disease)   . Prediabetes   . Chronic pain   Surgical History: Past Surgical History  Procedure Laterality Date  . Tonsillectomy    . Cholecystectomy    . Colonoscopy    . Upper gastrointestinal endoscopy    . Mandible surgery  09/06/1979  . Orif wrist fracture Right 07/23/2013    Procedure: OPEN REDUCTION INTERNAL FIXATION (ORIF) WRIST FRACTURE;  Surgeon:  Roseanne Kaufman, MD;  Location: [facility];  Service: Orthopedics;  Laterality: Right;  Family History family history includes Alcohol abuse in her cousin, father, maternal grandfather, maternal grandmother, paternal grandfather, paternal grandmother, and paternal uncle; Anxiety disorder in her maternal uncle and paternal uncle; Depression in her father; Diabetes in her brother, brother, and father; Healthy in her brother; Heart disease in her brother; Irritable bowel syndrome in her mother; OCD in her brother and brother; Sexual abuse in her mother; Stroke in her father and mother. There is no history of ADD / ADHD, Bipolar disorder,  Dementia, Drug abuse, Paranoid behavior, Schizophrenia, Seizures, or Physical abuse. Reviewed al this again to day in the appointment and nothing has changed  Mental status examination Patient is fairly groomed and dressedShe is anxious depressed and tearful .  She admits to  suicidal thoughts but denies homicidal thoughts.  She denies paranoia and she denies any hallucination. Her attention and concentration is fair. She is quite obsessed about what other people think of her  She's alert and oriented x3 and her insight judgment and impulse control is difficult to to assess due to her constant negativity. She seems unable to make decisions and given her constant suicidal ideation she would be safer in the hospital.   Lab Results:  Results for orders placed during the hospital encounter of 07/23/13 (from the past 8736 hour(s))  POCT I-STAT, CHEM 8   Collection Time    07/23/13 10:27 AM      Result Value Ref Range   Sodium 140  135 - 145 mEq/L   Potassium 4.5  3.5 - 5.1 mEq/L   Chloride 101  96 - 112 mEq/L   BUN 20  6 - 23 mg/dL   Creatinine, Ser 0.90  0.50 - 1.10 mg/dL   Glucose, Bld 123 (*) 70 - 99 mg/dL   Calcium, Ion 1.26  1.13 - 1.30 mmol/L   TCO2 29  0 - 100 mmol/L   Hemoglobin 14.6  12.0 - 15.0 g/dL   HCT 43.0  36.0 - 46.0 %  CBC WITH DIFFERENTIAL   Collection Time    07/23/13  2:41 PM      Result Value Ref Range   WBC 9.2  4.0 - 10.5 K/uL   RBC 4.85  3.87 - 5.11 MIL/uL   Hemoglobin 13.0  12.0 - 15.0 g/dL   HCT 40.9  36.0 - 46.0 %   MCV 84.3  78.0 - 100.0 fL   MCH 26.8  26.0 - 34.0 pg   MCHC 31.8  30.0 - 36.0 g/dL   RDW 13.6  11.5 - 15.5 %   Platelets 205  150 - 400 K/uL   Neutrophils Relative % 76  43 - 77 %   Neutro Abs 7.0  1.7 - 7.7 K/uL   Lymphocytes Relative 14  12 - 46 %   Lymphs Abs 1.3  0.7 - 4.0 K/uL   Monocytes Relative 7  3 - 12 %   Monocytes Absolute 0.6  0.1 - 1.0 K/uL   Eosinophils Relative 2  0 - 5 %   Eosinophils Absolute 0.2  0.0 - 0.7 K/uL    Basophils Relative 0  0 - 1 %   Basophils Absolute 0.0  0.0 - 0.1 K/uL  BASIC METABOLIC PANEL   Collection Time    07/23/13  2:41 PM      Result Value Ref Range   Sodium 140  135 - 145 mEq/L   Potassium 4.2  3.5 -  5.1 mEq/L   Chloride 100  96 - 112 mEq/L   CO2 32  19 - 32 mEq/L   Glucose, Bld 100 (*) 70 - 99 mg/dL   BUN 15  6 - 23 mg/dL   Creatinine, Ser 0.64  0.50 - 1.10 mg/dL   Calcium 9.8  8.4 - 10.5 mg/dL   GFR calc non Af Amer >90  >90 mL/min   GFR calc Af Amer >90  >90 mL/min  GLUCOSE, CAPILLARY   Collection Time    07/23/13 10:30 PM      Result Value Ref Range   Glucose-Capillary 112 (*) 70 - 99 mg/dL  GLUCOSE, CAPILLARY   Collection Time    07/24/13  7:28 AM      Result Value Ref Range   Glucose-Capillary 113 (*) 70 - 99 mg/dL  GLUCOSE, CAPILLARY   Collection Time    07/24/13 11:54 AM      Result Value Ref Range   Glucose-Capillary 121 (*) 70 - 99 mg/dL  GLUCOSE, CAPILLARY   Collection Time    07/24/13  2:50 PM      Result Value Ref Range   Glucose-Capillary 152 (*) 70 - 99 mg/dL  GLUCOSE, CAPILLARY   Collection Time    07/24/13  4:23 PM      Result Value Ref Range   Glucose-Capillary 138 (*) 70 - 99 mg/dL   Comment 1 Documented in Chart     Comment 2 Notify RN    COMPREHENSIVE METABOLIC PANEL   Collection Time    07/24/13  4:58 PM      Result Value Ref Range   Sodium 129 (*) 135 - 145 mEq/L   Potassium 4.2  3.5 - 5.1 mEq/L   Chloride 90 (*) 96 - 112 mEq/L   CO2 29  19 - 32 mEq/L   Glucose, Bld 144 (*) 70 - 99 mg/dL   BUN 10  6 - 23 mg/dL   Creatinine, Ser 0.61  0.50 - 1.10 mg/dL   Calcium 8.8  8.4 - 10.5 mg/dL   Total Protein 7.2  6.0 - 8.3 g/dL   Albumin 3.5  3.5 - 5.2 g/dL   AST 37  0 - 37 U/L   ALT 25  0 - 35 U/L   Alkaline Phosphatase 76  39 - 117 U/L   Total Bilirubin 0.6  0.3 - 1.2 mg/dL   GFR calc non Af Amer >90  >90 mL/min   GFR calc Af Amer >90  >90 mL/min  CBC   Collection Time    07/24/13  4:58 PM      Result Value Ref Range    WBC 8.9  4.0 - 10.5 K/uL   RBC 4.29  3.87 - 5.11 MIL/uL   Hemoglobin 11.6 (*) 12.0 - 15.0 g/dL   HCT 35.8 (*) 36.0 - 46.0 %   MCV 83.4  78.0 - 100.0 fL   MCH 27.0  26.0 - 34.0 pg   MCHC 32.4  30.0 - 36.0 g/dL   RDW 13.7  11.5 - 15.5 %   Platelets 167  150 - 400 K/uL  GLUCOSE, CAPILLARY   Collection Time    07/24/13  9:44 PM      Result Value Ref Range   Glucose-Capillary 131 (*) 70 - 99 mg/dL  CBC   Collection Time    07/25/13  3:43 AM      Result Value Ref Range   WBC 8.4  4.0 - 10.5 K/uL   RBC  4.30  3.87 - 5.11 MIL/uL   Hemoglobin 11.7 (*) 12.0 - 15.0 g/dL   HCT 34.8 (*) 36.0 - 46.0 %   MCV 80.9  78.0 - 100.0 fL   MCH 27.2  26.0 - 34.0 pg   MCHC 33.6  30.0 - 36.0 g/dL   RDW 13.0  11.5 - 15.5 %   Platelets 174  150 - 400 K/uL  COMPREHENSIVE METABOLIC PANEL   Collection Time    07/25/13  3:43 AM      Result Value Ref Range   Sodium 124 (*) 135 - 145 mEq/L   Potassium 3.9  3.5 - 5.1 mEq/L   Chloride 87 (*) 96 - 112 mEq/L   CO2 28  19 - 32 mEq/L   Glucose, Bld 108 (*) 70 - 99 mg/dL   BUN 8  6 - 23 mg/dL   Creatinine, Ser 0.50  0.50 - 1.10 mg/dL   Calcium 8.7  8.4 - 10.5 mg/dL   Total Protein 6.8  6.0 - 8.3 g/dL   Albumin 3.2 (*) 3.5 - 5.2 g/dL   AST 34  0 - 37 U/L   ALT 22  0 - 35 U/L   Alkaline Phosphatase 72  39 - 117 U/L   Total Bilirubin 0.7  0.3 - 1.2 mg/dL   GFR calc non Af Amer >90  >90 mL/min   GFR calc Af Amer >90  >90 mL/min  GLUCOSE, CAPILLARY   Collection Time    07/25/13  6:47 AM      Result Value Ref Range   Glucose-Capillary 119 (*) 70 - 99 mg/dL  GLUCOSE, CAPILLARY   Collection Time    07/25/13 11:17 AM      Result Value Ref Range   Glucose-Capillary 124 (*) 70 - 99 mg/dL   Comment 1 Documented in Chart     Comment 2 Notify RN    GLUCOSE, CAPILLARY   Collection Time    07/25/13 12:28 PM      Result Value Ref Range   Glucose-Capillary 167 (*) 70 - 99 mg/dL  GLUCOSE, CAPILLARY   Collection Time    07/25/13  4:26 PM      Result Value Ref  Range   Glucose-Capillary 114 (*) 70 - 99 mg/dL   Comment 1 Documented in Chart     Comment 2 Notify RN    GLUCOSE, CAPILLARY   Collection Time    07/25/13  9:09 PM      Result Value Ref Range   Glucose-Capillary 144 (*) 70 - 99 mg/dL  GLUCOSE, CAPILLARY   Collection Time    07/26/13  7:07 AM      Result Value Ref Range   Glucose-Capillary 131 (*) 70 - 99 mg/dL  COMPREHENSIVE METABOLIC PANEL   Collection Time    07/26/13  9:05 AM      Result Value Ref Range   Sodium 124 (*) 135 - 145 mEq/L   Potassium 3.6  3.5 - 5.1 mEq/L   Chloride 88 (*) 96 - 112 mEq/L   CO2 27  19 - 32 mEq/L   Glucose, Bld 132 (*) 70 - 99 mg/dL   BUN 11  6 - 23 mg/dL   Creatinine, Ser 0.51  0.50 - 1.10 mg/dL   Calcium 8.4  8.4 - 10.5 mg/dL   Total Protein 6.8  6.0 - 8.3 g/dL   Albumin 3.2 (*) 3.5 - 5.2 g/dL   AST 43 (*) 0 - 37 U/L   ALT 27  0 - 35 U/L   Alkaline Phosphatase 71  39 - 117 U/L   Total Bilirubin 0.5  0.3 - 1.2 mg/dL   GFR calc non Af Amer >90  >90 mL/min   GFR calc Af Amer >90  >90 mL/min  GLUCOSE, CAPILLARY   Collection Time    07/26/13 11:56 AM      Result Value Ref Range   Glucose-Capillary 118 (*) 70 - 99 mg/dL  CBC   Collection Time    07/26/13 12:00 PM      Result Value Ref Range   WBC 10.0  4.0 - 10.5 K/uL   RBC 4.29  3.87 - 5.11 MIL/uL   Hemoglobin 11.7 (*) 12.0 - 15.0 g/dL   HCT 34.7 (*) 36.0 - 46.0 %   MCV 80.9  78.0 - 100.0 fL   MCH 27.3  26.0 - 34.0 pg   MCHC 33.7  30.0 - 36.0 g/dL   RDW 13.3  11.5 - 15.5 %   Platelets 178  150 - 400 K/uL  GLUCOSE, CAPILLARY   Collection Time    07/26/13  4:37 PM      Result Value Ref Range   Glucose-Capillary 113 (*) 70 - 99 mg/dL  Family doctor is following her labs  Assessment Axis I Maj. depressive disorder, insomnia, pain issues, HIstory of alcoholism. Axis II deferred Axis III see medical history Axis IV mild to moderate  Plan: She and her husband will go  to [facility]. She will followup after discharge  MEDICATIONS this encounter: No orders of the defined types were placed in this encounter.    Medical Decision Making Problem Points:  Established problem, worsening (2), Review of last therapy session (1) and Review of psycho-social stressors (1) Data Points:  Review or order clinical lab tests (1) Review of medication regiment & side effects (2) Review of new medications or change in dosage (2)  I certify that outpatient services furnished can reasonably be expected to improve the patient's condition.   Levonne Spiller, MD

## 2014-03-17 NOTE — ED Notes (Signed)
Portable phone offered at patient request

## 2014-03-17 NOTE — BH Assessment (Signed)
[facility] Assessment Progress Note      Spoke to Dr Eulis Foster regarding patient. Psychiatrist referred pt due to increasing depression and suicidal thoughts.  Does not yet have a plan.

## 2014-03-17 NOTE — ED Notes (Signed)
Sent over by her psychiatrist today who sent her over for evaluation d/t pt having thoughts of " wanting to kill myself" flat affect in triage. Does not have plan.

## 2014-03-17 NOTE — ED Provider Notes (Addendum)
CSN: [id number]     Arrival date & time 03/17/14  1345 History   First MD Initiated Contact with Patient 03/17/14 1409     Chief Complaint  Patient presents with  . V70.1     (Consider location/radiation/quality/duration/timing/severity/associated sxs/prior Treatment) HPI  Lauren [name] is a 63 y.o. female who has been seeing her psychiatrist, recently for depression, and gradually worsening suicidal ideation. She does not have a suicidal plan. Today, after her appointment, her psychiatrist sent her, by private vehicle, for evaluation of suicidal ideation. She has a remote history of suicide attempt by taking pills. She lives with her husband, with whom, she states that she is getting along with, well. She states that she feels anxious. She had one episode of loose bowel movement, today. She does not have fever, chills, cough, shortness of breath, weakness, or dizziness. She's taking her usual medications, without relief. There are no other known modifying factors.   Past Medical History  Diagnosis Date  . Depression   . Osteoarthritis   . Fibromyalgia   . IBS (irritable bowel syndrome)   . GERD (gastroesophageal reflux disease)   . Prediabetes   . Chronic pain    Past Surgical History  Procedure Laterality Date  . Tonsillectomy    . Cholecystectomy    . Colonoscopy    . Upper gastrointestinal endoscopy    . Mandible surgery  09/06/1979  . Orif wrist fracture Right 07/23/2013    Procedure: OPEN REDUCTION INTERNAL FIXATION (ORIF) WRIST FRACTURE;  Surgeon: Roseanne Kaufman, MD;  Location: [facility];  Service: Orthopedics;  Laterality: Right;   Family History  Problem Relation Age of Onset  . Alcohol abuse Father   . Diabetes Father   . Stroke Father   . Depression Father   . Anxiety disorder Paternal Uncle   . Alcohol abuse Paternal Uncle   . Alcohol abuse Maternal Grandfather   . Alcohol abuse Maternal Grandmother   . Alcohol abuse Paternal Grandfather   . Alcohol abuse  Paternal Grandmother   . Alcohol abuse Cousin   . Anxiety disorder Maternal Uncle   . Stroke Mother   . Irritable bowel syndrome Mother   . Sexual abuse Mother   . Diabetes Brother   . OCD Brother   . Healthy Brother   . OCD Brother   . Diabetes Brother   . Heart disease Brother   . ADD / ADHD Neg Hx   . Bipolar disorder Neg Hx   . Dementia Neg Hx   . Drug abuse Neg Hx   . Paranoid behavior Neg Hx   . Schizophrenia Neg Hx   . Seizures Neg Hx   . Physical abuse Neg Hx    History  Substance Use Topics  . Smoking status: Former Smoker -- 2.00 packs/day    Quit date: 09/06/1983  . Smokeless tobacco: Never Used  . Alcohol Use: No   OB History   Grav Para Term Preterm Abortions TAB SAB Ect Mult Living                 Review of Systems  All other systems reviewed and are negative.     Allergies  Elavil; Flagyl; Vistaril; Geodon; and Lactose intolerance (gi)  Home Medications   Prior to Admission medications   Medication Sig Start Date End Date Taking? Authorizing Provider  albuterol (PROAIR HFA) 108 (90 BASE) MCG/ACT inhaler Inhale 2 puffs into the lungs every 4 (four) hours as needed for wheezing or shortness of  breath. 03/21/14   Encarnacion Slates, NP  calcium-vitamin D (OSCAL WITH D) 500-200 MG-UNIT per tablet Take 1 tablet by mouth 2 (two) times daily. For low calcium 03/21/14   Encarnacion Slates, NP  diphenoxylate-atropine (LOMOTIL) 2.5-0.025 MG per tablet Take 1 tablet by mouth 4 (four) times daily as needed for diarrhea or loose stools. 03/21/14   Encarnacion Slates, NP  LORazepam (ATIVAN) 1 MG tablet Take 1 tablet (1 mg total) by mouth every 12 (twelve) hours. For anxiety 03/21/14   Encarnacion Slates, NP  methocarbamol (ROBAXIN) 500 MG tablet Take 1 tablet (500 mg total) by mouth at bedtime. Muscle pain 03/21/14   Encarnacion Slates, NP  pravastatin (PRAVACHOL) 20 MG tablet Take 1 tablet (20 mg total) by mouth daily. For high cholesterol 03/21/14   Encarnacion Slates, NP  traZODone (DESYREL) 25  mg TABS tablet Take 0.5 tablets (25 mg total) by mouth at bedtime. For sleep 03/21/14   Encarnacion Slates, NP  venlafaxine XR (EFFEXOR-XR) 75 MG 24 hr capsule Take 3 capsules (225 mg total) by mouth daily with breakfast. For depression 03/21/14   Encarnacion Slates, NP   BP 106/58  Pulse 88  Temp(Src) 98 F (36.7 C) (Oral)  Resp 18  SpO2 95% Physical Exam  Nursing note and vitals reviewed. Constitutional: She is oriented to person, place, and time. She appears well-developed and well-nourished.  HENT:  Head: Normocephalic and atraumatic.  Eyes: Conjunctivae and EOM are normal. Pupils are equal, round, and reactive to light.  Neck: Normal range of motion and phonation normal. Neck supple.  Cardiovascular: Normal rate, regular rhythm and intact distal pulses.   Pulmonary/Chest: Effort normal and breath sounds normal. She exhibits no tenderness.  Abdominal: Soft. She exhibits no distension. There is no tenderness. There is no guarding.  Musculoskeletal: Normal range of motion.  Neurological: She is alert and oriented to person, place, and time. She exhibits normal muscle tone.  Skin: Skin is warm and dry.  Psychiatric: Her behavior is normal. Judgment and thought content normal.  She appears depressed    ED Course  Procedures (including critical care time)  Medications - No data to display  No data found.   14:25- Discussed with TSS, they will see the patient  4:28 PM Reevaluation with update and discussion. After initial assessment and treatment, an updated evaluation reveals no change in status. Lauren [name]      Labs Review Labs Reviewed  BASIC METABOLIC PANEL - Abnormal; Notable for the following:    Glucose, Bld 122 (*)    All other components within normal limits  URINALYSIS, ROUTINE W REFLEX MICROSCOPIC - Abnormal; Notable for the following:    Specific Gravity, Urine <1.005 (*)    All other components within normal limits  CBG MONITORING, ED - Abnormal; Notable for the  following:    Glucose-Capillary 196 (*)    All other components within normal limits  URINE CULTURE  CBC WITH DIFFERENTIAL  ETHANOL  URINE RAPID DRUG SCREEN (HOSP PERFORMED)    Imaging Review No results found.   EKG Interpretation None      MDM   Final diagnoses:  Depression    Depression with suicidal ideation. She does not have active plan. She has failed outpatient treatment, with her psychiatric service.  Nursing Notes Reviewed/ Care Coordinated Applicable Imaging Reviewed Interpretation of Laboratory Data incorporated into ED treatment  Plan- As per oncoming treatment team, with assisstance of TTS    Michalina Calbert [name]  Eulis Foster, MD 03/17/14 [place], MD 03/26/14 1024

## 2014-03-17 NOTE — ED Notes (Signed)
Pt ambulated to restroom & returned to room w/ no complications. Sitter remains w/ pt. Pt allowed to use the phone.

## 2014-03-17 NOTE — ED Notes (Signed)
Pt ambulated to restroom & returned to room w/ no complications. Sitter remains w/ pt.

## 2014-03-18 ENCOUNTER — Encounter (HOSPITAL_COMMUNITY): Payer: Self-pay | Admitting: *Deleted

## 2014-03-18 ENCOUNTER — Inpatient Hospital Stay (HOSPITAL_COMMUNITY)
Admission: AD | Admit: 2014-03-18 | Discharge: 2014-03-21 | DRG: 885 | Disposition: A | Discharge status: Home / Self Care | Payer: 59 | Source: Intra-hospital | Attending: Psychiatry | Admitting: Psychiatry

## 2014-03-18 DIAGNOSIS — F329 Major depressive disorder, single episode, unspecified: Secondary | ICD-10-CM | POA: Diagnosis present

## 2014-03-18 DIAGNOSIS — E7800 Pure hypercholesterolemia, unspecified: Secondary | ICD-10-CM | POA: Diagnosis present

## 2014-03-18 DIAGNOSIS — Z833 Family history of diabetes mellitus: Secondary | ICD-10-CM | POA: Diagnosis not present

## 2014-03-18 DIAGNOSIS — Z87891 Personal history of nicotine dependence: Secondary | ICD-10-CM | POA: Diagnosis not present

## 2014-03-18 DIAGNOSIS — Z96659 Presence of unspecified artificial knee joint: Secondary | ICD-10-CM

## 2014-03-18 DIAGNOSIS — M1990 Unspecified osteoarthritis, unspecified site: Secondary | ICD-10-CM | POA: Diagnosis present

## 2014-03-18 DIAGNOSIS — Z823 Family history of stroke: Secondary | ICD-10-CM | POA: Diagnosis not present

## 2014-03-18 DIAGNOSIS — Z8249 Family history of ischemic heart disease and other diseases of the circulatory system: Secondary | ICD-10-CM | POA: Diagnosis not present

## 2014-03-18 DIAGNOSIS — R45851 Suicidal ideations: Secondary | ICD-10-CM

## 2014-03-18 DIAGNOSIS — IMO0001 Reserved for inherently not codable concepts without codable children: Secondary | ICD-10-CM | POA: Diagnosis present

## 2014-03-18 DIAGNOSIS — G8929 Other chronic pain: Secondary | ICD-10-CM | POA: Diagnosis present

## 2014-03-18 DIAGNOSIS — J45909 Unspecified asthma, uncomplicated: Secondary | ICD-10-CM | POA: Diagnosis present

## 2014-03-18 DIAGNOSIS — Z23 Encounter for immunization: Secondary | ICD-10-CM | POA: Diagnosis not present

## 2014-03-18 DIAGNOSIS — F332 Major depressive disorder, recurrent severe without psychotic features: Principal | ICD-10-CM | POA: Diagnosis present

## 2014-03-18 DIAGNOSIS — F3289 Other specified depressive episodes: Secondary | ICD-10-CM | POA: Diagnosis not present

## 2014-03-18 DIAGNOSIS — K589 Irritable bowel syndrome without diarrhea: Secondary | ICD-10-CM | POA: Diagnosis present

## 2014-03-18 DIAGNOSIS — F411 Generalized anxiety disorder: Secondary | ICD-10-CM | POA: Diagnosis present

## 2014-03-18 DIAGNOSIS — F1021 Alcohol dependence, in remission: Secondary | ICD-10-CM | POA: Diagnosis present

## 2014-03-18 DIAGNOSIS — F32A Depression, unspecified: Secondary | ICD-10-CM

## 2014-03-18 DIAGNOSIS — G4700 Insomnia, unspecified: Secondary | ICD-10-CM | POA: Diagnosis present

## 2014-03-18 DIAGNOSIS — K219 Gastro-esophageal reflux disease without esophagitis: Secondary | ICD-10-CM | POA: Diagnosis present

## 2014-03-18 LAB — URINE CULTURE
Colony Count: NO GROWTH
Culture: NO GROWTH

## 2014-03-18 LAB — CBG MONITORING, ED: Glucose-Capillary: 196 mg/dL — ABNORMAL HIGH (ref 70–99)

## 2014-03-18 LAB — GLUCOSE, CAPILLARY: Glucose-Capillary: 114 mg/dL — ABNORMAL HIGH (ref 70–99)

## 2014-03-18 MED ORDER — DIPHENOXYLATE-ATROPINE 2.5-0.025 MG PO TABS: 1.0000 | ORAL_TABLET | Freq: Four times a day (QID) | ORAL | Status: DC | PRN

## 2014-03-18 MED ORDER — LORAZEPAM 1 MG PO TABS
1.0000 mg | ORAL_TABLET | Freq: Three times a day (TID) | ORAL | Status: DC
  Administered 2014-03-18 – 2014-03-19 (×3): 1 mg via ORAL
  Filled 2014-03-18 (×5): qty 1

## 2014-03-18 MED ORDER — VENLAFAXINE HCL ER 75 MG PO CP24
225.0000 mg | ORAL_CAPSULE | Freq: Every day | ORAL | Status: DC
  Administered 2014-03-18 – 2014-03-21 (×4): 225 mg via ORAL
  Filled 2014-03-18 (×3): qty 3
  Filled 2014-03-18: qty 12
  Filled 2014-03-18: qty 3
  Filled 2014-03-18: qty 6
  Filled 2014-03-18 (×2): qty 3

## 2014-03-18 MED ORDER — VENLAFAXINE HCL ER 150 MG PO CP24
150.0000 mg | ORAL_CAPSULE | Freq: Every day | ORAL | Status: DC
  Filled 2014-03-18 (×2): qty 1

## 2014-03-18 MED ORDER — SIMVASTATIN 10 MG PO TABS
10.0000 mg | ORAL_TABLET | Freq: Every day | ORAL | Status: DC
  Administered 2014-03-18 – 2014-03-20 (×3): 10 mg via ORAL
  Filled 2014-03-18 (×5): qty 1

## 2014-03-18 MED ORDER — ALBUTEROL SULFATE HFA 108 (90 BASE) MCG/ACT IN AERS: 2.0000 | INHALATION_SPRAY | RESPIRATORY_TRACT | Status: DC | PRN

## 2014-03-18 MED ORDER — METHOCARBAMOL 500 MG PO TABS
500.0000 mg | ORAL_TABLET | Freq: Every day | ORAL | Status: DC
  Administered 2014-03-18 – 2014-03-20 (×3): 500 mg via ORAL
  Filled 2014-03-18 (×3): qty 1
  Filled 2014-03-18: qty 4
  Filled 2014-03-18: qty 1

## 2014-03-18 MED ORDER — CALCIUM CARBONATE-VITAMIN D 500-200 MG-UNIT PO TABS
1.0000 | ORAL_TABLET | Freq: Two times a day (BID) | ORAL | Status: DC
  Administered 2014-03-18 – 2014-03-21 (×6): 1 via ORAL
  Filled 2014-03-18 (×10): qty 1

## 2014-03-18 MED ORDER — RISPERIDONE 1 MG PO TABS
1.0000 mg | ORAL_TABLET | Freq: Every day | ORAL | Status: DC
  Administered 2014-03-18: 1 mg via ORAL
  Filled 2014-03-18 (×4): qty 1

## 2014-03-18 MED ORDER — DULOXETINE HCL 30 MG PO CPEP
30.0000 mg | ORAL_CAPSULE | Freq: Every day | ORAL | Status: DC
  Filled 2014-03-18 (×2): qty 1
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
## 2014-03-18 NOTE — Tx Team (Signed)
Initial Interdisciplinary Treatment Plan  PATIENT STRENGTHS: (choose at least two) Ability for insight Average or above average intelligence Capable of independent living Communication skills [place] of knowledge Motivation for treatment/growth Physical Health Religious Affiliation Supportive family/friends Work skills  PATIENT STRESSORS: Loss of her AA group, her lifelong church and her job   PROBLEM LIST: Problem List/Patient Goals Date to be addressed Date deferred Reason deferred Estimated date of resolution  Improved mood 03/18/14      No longer feels suicidal  03/18/14                                                DISCHARGE CRITERIA:  Improved stabilization in mood, thinking, and/or behavior Reduction of life-threatening or endangering symptoms to within safe limits Verbal commitment to aftercare and medication compliance  PRELIMINARY DISCHARGE PLAN: Attend aftercare/continuing care group Return to previous living arrangement  PATIENT/FAMIILY INVOLVEMENT: This treatment plan has been presented to and reviewed with the patient, Lauren [name].  The patient and family have been given the opportunity to ask questions and make suggestions.  Clayborn Heron 03/18/2014, 11:44 AM

## 2014-03-18 NOTE — ED Notes (Signed)
Pt ambulated to BR without difficulty after eating breakfast. Sitter at bed side

## 2014-03-18 NOTE — Progress Notes (Signed)
Recreation Therapy Notes  Animal-Assisted Activity/Therapy (AAA/T) Program Checklist/Progress Notes Patient Eligibility Criteria Checklist & Daily Group note for Rec Tx Intervention  Date: 07.14.2015 Time: 3:15pm Location: [address]   AAA/T Program Assumption of Risk Form signed by Patient/ or Parent Legal Guardian yes  Patient is free of allergies or sever asthma yes  Patient reports no fear of animals yes  Patient reports no history of cruelty to animals yes   Patient understands his/her participation is voluntary yes  Patient washes hands before animal contact yes  Patient washes hands after animal contact yes  Behavioral Response: Engaged, Appropriate   Education: Hand Washing, Appropriate Animal Interaction   Education Outcome: Acknowledges understanding   Clinical Observations/Feedback: Patient engaged appropriately with therapy dog and peers in session. Patient asked appropriate questions about therapy dog and his training.   Laureen Ochs Merrie Epler, LRT/CTRS        Antwonette Feliz L 03/18/2014 5:07 PM

## 2014-03-18 NOTE — Progress Notes (Signed)
Adult Psychoeducational Group Note  Date:  03/18/2014 Time:  9:39 PM  Group Topic/Focus:  Wrap-Up Group:   The focus of this group is to help patients review their daily goal of treatment and discuss progress on daily workbooks.  Participation Level:  Active  Participation Quality:  Appropriate  Affect:  Appropriate  Cognitive:  Alert  Insight: Appropriate and Good  Engagement in Group:  Developing/Improving and Engaged  Modes of Intervention:  Activity and Education  Additional Comments:  Pt was engaged in group... Talked about the fun she had when the therapeutic dog came today   [name] 03/18/2014, 9:39 PM

## 2014-03-18 NOTE — Progress Notes (Signed)
Patient ID: Lauren [name], female   DOB: [birth year], 63 y.o.   MRN: [id number] Patient is 63 year old female admitted for depression and suicidal ideation without a plan.  She has been sad, anxious, cooperative and tearful at times.  She has been in recovery 24 years and recently her home group of AA dissolved.  Her lifelong church also merged with another church and her job working in a dialysis clinic ended.  She feels like "I don't fit in anywhere."  She started on effexor 3 weeks ago and while it has helped she thinks it needs to be increased as she is still very depressed and anxious.  She says that her mind races and she "obsesses about what people think of me". She is diabetic but has not been taking metforman.  Says she has been managing her diabetes with her diet and has lost weight sine watching her carb inatle. She also has IBS and was having diarrhea yesterday, but none today.  She takes imodium for this.  She is a fall risk due to her age and some dizziness that she attributes to [place] she took last night. Fall risk safety plan initiated.  She hsa not had her effexor this am and is concerned about this.  She is already participating in groups and is hoping to learn a lot while here.

## 2014-03-18 NOTE — BHH Group Notes (Signed)
[place] LCSW Group Therapy  03/18/2014   1:15 PM   Type of Therapy:  Group Therapy  Participation Level:  Active  Participation Quality:  Attentive, Sharing and Supportive  Affect:  Depressed and Flat  Cognitive:  Alert and Oriented  Insight:  Developing/Improving and Engaged  Engagement in Therapy:  Developing/Improving and Engaged  Modes of Intervention:  Activity, Clarification, Confrontation, Discussion, Education, Exploration, Limit-setting, Orientation, Problem-solving, Rapport Building, Art therapist, Socialization and Support  Summary of Progress/Problems: Patient was attentive and engaged with speaker from [place].  Patient was attentive to speaker while they shared their story of dealing with mental health and overcoming it.  Patient expressed interest in their programs and services and received information on their agency.  Patient processed ways they can relate to the speaker.     Regan Lemming, LCSW 03/18/2014  1:43 PM
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
## 2014-03-18 NOTE — ED Notes (Signed)
Pt in room watching TV at this time. Calm, co-operative, denies SI at this time. Reports she has had SI over the last several weeks and feels she needs help with her medications. Pt given room at [facility].

## 2014-03-18 NOTE — ED Notes (Signed)
Pt given belongings, red shirt and khaki pants. Pt on phone at this time with husband and waiting on transport to [facility].
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
## 2014-03-18 NOTE — Tx Team (Signed)
Interdisciplinary Treatment Plan Update (Adult)  Date: 03/18/2014  Time Reviewed:  9:45 AM  Progress in Treatment: Attending groups: Yes Participating in groups:  Yes Taking medication as prescribed:  Yes Tolerating medication:  Yes Family/Significant othe contact made: CSW assessing  Patient understands diagnosis:  Yes Discussing patient identified problems/goals with staff:  Yes Medical problems stabilized or resolved:  Yes Denies suicidal/homicidal ideation: Yes Issues/concerns per patient self-inventory:  Yes Other:  New problem(s) identified: N/A  Discharge Plan or Barriers: CSW assessing for appropriate referrals.  Reason for Continuation of Hospitalization: Anxiety Depression Medication Stabilization  Comments: N/A  Estimated length of stay: 3-5 days  For review of initial/current patient goals, please see plan of care.  Attendees: Patient:     Family:     Physician:  Dr. Parke Poisson 03/18/2014 11:16 AM   Nursing:   Grayland Ormond, RN 03/18/2014 11:16 AM   Clinical Social Worker:  Regan Lemming, LCSW 03/18/2014 11:16 AM   Other: Joette Catching, LCSW 03/18/2014 11:16 AM   Other:  Thurnell Garbe, RN 03/18/2014 11:16 AM   Other:  Marilynne Halsted, RN 03/18/2014 11:16 AM   Other:     Other:    Other:    Other:    Other:    Other:    Other:     Scribe for Treatment Team:   Ane Payment, 03/18/2014 11:16 AM

## 2014-03-18 NOTE — ED Notes (Signed)
Patient resting in bed in a position of comfort. Eyes closed, even rise and fall of chest.

## 2014-03-19 DIAGNOSIS — F329 Major depressive disorder, single episode, unspecified: Secondary | ICD-10-CM | POA: Diagnosis present

## 2014-03-19 LAB — GLUCOSE, CAPILLARY
Glucose-Capillary: 110 mg/dL — ABNORMAL HIGH (ref 70–99)
Glucose-Capillary: 140 mg/dL — ABNORMAL HIGH (ref 70–99)
Glucose-Capillary: 120 mg/dL — ABNORMAL HIGH (ref 70–99)

## 2014-03-19 LAB — TSH: TSH: 2.4 u[IU]/mL (ref 0.350–4.500)

## 2014-03-19 MED ORDER — NICOTINE 21 MG/24HR TD PT24: 21.0000 mg | MEDICATED_PATCH | Freq: Every day | TRANSDERMAL | Status: DC

## 2014-03-19 MED ORDER — ACETAMINOPHEN 325 MG PO TABS: 650.0000 mg | ORAL_TABLET | Freq: Four times a day (QID) | ORAL | Status: DC | PRN

## 2014-03-19 MED ORDER — ALUM & MAG HYDROXIDE-SIMETH 200-200-20 MG/5ML PO SUSP: 30.0000 mL | ORAL | Status: DC | PRN

## 2014-03-19 MED ORDER — MAGNESIUM HYDROXIDE 400 MG/5ML PO SUSP: 30.0000 mL | Freq: Every day | ORAL | Status: DC | PRN

## 2014-03-19 MED ORDER — TRAZODONE HCL 50 MG PO TABS
50.0000 mg | ORAL_TABLET | Freq: Every day | ORAL | Status: DC
  Administered 2014-03-19: 50 mg via ORAL
  Filled 2014-03-19 (×3): qty 1

## 2014-03-19 NOTE — BHH Group Notes (Signed)
[facility] LCSW Aftercare Discharge Planning Group Note   03/19/2014 8:45 AM  Participation Quality:  Alert, Appropriate and Oriented  Mood/Affect:  Flat and Depressed  Depression Rating:  4  Anxiety Rating:  4  Thoughts of Suicide:  Pt denies SI/HI  Will you contract for safety?   Yes  Current AVH:  Pt denies  Plan for Discharge/Comments:  Pt attended discharge planning group and actively participated in group.  CSW provided pt with today's workbook.  Pt reports coming to the hospital for depression.  Pt states that she lives in [place] with her husband and has follow up scheduled with [facility] for medication management and therapy.  No further needs voiced by pt at this time.    Transportation Means: Pt reports access to transportation - husband will pick pt up  Supports: husband is main [place], LCSW 03/19/2014 10:08 AM

## 2014-03-19 NOTE — Progress Notes (Signed)
Pt has been observed sitting in the dayroom watching TV and talking with peers.  Pt reports she is doing better and denies SI/HI/AV with this Probation officer.  Pt says she will return home with her husband at discharge.  She says her husband is supportive.  Pt voices no needs or concerns this evening.  Pt was encouraged to make her needs known to staff.  Pt attended evening group.  Support and encouragement offered.  Safety maintained with q15 minute checks.

## 2014-03-19 NOTE — BHH Group Notes (Signed)
[place] LCSW Group Therapy  03/19/2014  1:15 PM   Type of Therapy:  Group Therapy  Participation Level:  Active  Participation Quality:  Attentive, Sharing and Supportive  Affect:  Depressed and Flat  Cognitive:  Alert and Oriented  Insight:  Developing/Improving and Engaged  Engagement in Therapy:  Developing/Improving and Engaged  Modes of Intervention:  Clarification, Confrontation, Discussion, Education, Exploration, Limit-setting, Orientation, Problem-solving, Rapport Building, Art therapist, Socialization and Support  Summary of Progress/Problems: The topic for group today was emotional regulation.  This group focused on both positive and negative emotion identification and allowed group members to process ways to identify feelings, regulate negative emotions, and find healthy ways to manage internal/external emotions. Group members were asked to reflect on a time when their reaction to an emotion led to a negative outcome and explored how alternative responses using emotion regulation would have benefited them. Group members were also asked to discuss a time when emotion regulation was utilized when a negative emotion was experienced.  Pt shared that she struggles with fear and trust, as she has lost a lot of her support system and struggles with trusting people again.  Pt explained that she utilized her support system to stay clean and sober for 24 years, but recently lost them all, for various reasons, which resulted in her depressed state.  Pt states that she knows she needs to find new support in order to prevent relapse and improve her mood.  Pt was encouraging to peers in regards to early recovery and getting through.  Pt actively participated and was engaged in group discussion.    Regan Lemming, LCSW 03/19/2014  2:58 PM

## 2014-03-19 NOTE — B[facility] Group Notes (Signed)
Adult Psychoeducational Group Note  Date:  03/19/2014 Time:  11:34 AM  Group Topic/Focus:  Developing a Wellness Toolbox:   The focus of this group is to help patients develop a "wellness toolbox" with skills and strategies to promote recovery upon discharge.  Participation Level:  Active  Participation Quality:  Appropriate  Affect:  Appropriate  Cognitive:  Alert and Appropriate  Insight: Appropriate  Engagement in Group:  Engaged  Modes of Intervention:  Activity and Discussion  Additional Comments:  The focus of this group is to help patients develop a "wellness toolbox" with skills and strategies to promote recovery upon discharge. Her stated that her husband was a good support system and he would be able to aid in his recovery.   Hettie Holstein D 03/19/2014, 11:34 AM

## 2014-03-19 NOTE — Progress Notes (Signed)
Adult Psychoeducational Group Note  Date:  03/19/2014 Time:  10:19 PM  Group Topic/Focus:  Wrap-Up Group:   The focus of this group is to help patients review their daily goal of treatment and discuss progress on daily workbooks.  Participation Level:  Active  Participation Quality:  Appropriate  Affect:  Appropriate  Cognitive:  Appropriate  Insight: Appropriate  Engagement in Group:  Engaged  Modes of Intervention:  Discussion  Additional Comments: The patient expressed that she learned in group about emotions.The patient said that the emotion that she talked about was fear.  Nash Shearer 03/19/2014, 10:19 PM

## 2014-03-19 NOTE — Progress Notes (Signed)
[facility] MD Progress Note  03/19/2014 3:06 PM Lauren [name]  MRN:  [id number] Subjective:  States she feels a little better, but still depressed and still anxious. " Like I worry a lot about what other people are thinking about me". Objective: Diagnosis:  Major Depression, Recurrent severe, Alcohol Dependence in sustained remission   Total Time spent with patient: 20 minutes   ADL's: fair  Sleep:  Slept better last night  Appetite:  Fair  Suicidal Ideation:  Denies any suicidal ideations  Homicidal Ideation:  Denies any homicidal ideations AEB (as evidenced by):  Psychiatric Specialty Exam: Physical Exam  Review of Systems  Constitutional: Negative for fever and chills.  Respiratory: Negative for cough and shortness of breath.   Cardiovascular: Negative for chest pain.  Psychiatric/Behavioral: Positive for depression. The patient is nervous/anxious.     Blood pressure 117/72, pulse 120, temperature 97.9 F (36.6 C), temperature source Oral, resp. rate 16, height 5\' 6"  (1.676 m), weight 63.957 kg (141 lb).Body mass index is 22.77 kg/(m^2).  General Appearance: Fairly Groomed  Engineer, water::  Good  Speech:  Normal Rate  Volume:  Normal  Mood:  Anxious and Depressed  Affect:  Depressed, mildly anxious  Thought Process:  Goal Directed and Linear  Orientation:  NA- fully alert and attentive  Thought Content:  denies any hallucinations, no delusions  Suicidal Thoughts:  No- denies any current suicidal ideations, no homicidal ideations, contracts for safety on the unit  Homicidal Thoughts:  No  Memory:  NA  Judgement:  Fair  Insight:  Fair  Psychomotor Activity:  Normal  Concentration:  Good  Recall:  Good  Fund of Knowledge:Good  Language: Good  Akathisia:  Negative  Handed:  Right  AIMS (if indicated):     Assets:  Communication Skills Resilience  Sleep:  Number of Hours: 6   Musculoskeletal: Strength & Muscle Tone: within normal limits Gait & Station:  normal Patient leans: N/A  Current Medications: Current Facility-Administered Medications  Medication Dose Route Frequency Provider Last Rate Last Dose  . acetaminophen (TYLENOL) tablet 650 mg  650 mg Oral Q6H PRN Encarnacion Slates, NP      . albuterol (PROVENTIL HFA;VENTOLIN HFA) 108 (90 BASE) MCG/ACT inhaler 2 puff  2 puff Inhalation Q4H PRN Benjamine Mola, FNP      . alum & mag hydroxide-simeth (MAALOX/MYLANTA) 200-200-20 MG/5ML suspension 30 mL  30 mL Oral Q4H PRN Encarnacion Slates, NP      . calcium-vitamin D (OSCAL WITH D) 500-200 MG-UNIT per tablet 1 tablet  1 tablet Oral BID Benjamine Mola, FNP   1 tablet at 03/19/14 [phone]  . diphenoxylate-atropine (LOMOTIL) 2.5-0.025 MG per tablet 1 tablet  1 tablet Oral QID PRN Benjamine Mola, FNP      . LORazepam (ATIVAN) tablet 1 mg  1 mg Oral 3 times per day Benjamine Mola, FNP   1 mg at 03/18/14 2153  . magnesium hydroxide (MILK OF MAGNESIA) suspension 30 mL  30 mL Oral Daily PRN Encarnacion Slates, NP      . methocarbamol (ROBAXIN) tablet 500 mg  500 mg Oral QHS Benjamine Mola, FNP   500 mg at 03/18/14 2153  . risperiDONE (RISPERDAL) tablet 1 mg  1 mg Oral QHS Benjamine Mola, FNP   1 mg at 03/18/14 2153  . simvastatin (ZOCOR) tablet 10 mg  10 mg Oral q1800 Benjamine Mola, FNP   10 mg at 03/18/14 1701  . traZODone (DESYREL)  tablet 50 mg  50 mg Oral QHS Encarnacion Slates, NP      . venlafaxine XR (EFFEXOR-XR) 24 hr capsule 225 mg  225 mg Oral Q breakfast Neita Garnet, MD   225 mg at 03/19/14 1275    Lab Results:  Results for orders placed during the hospital encounter of 03/18/14 (from the past 48 hour(s))  GLUCOSE, CAPILLARY     Status: Abnormal   Collection Time    03/18/14 12:08 PM      Result Value Ref Range   Glucose-Capillary 114 (*) 70 - 99 mg/dL  GLUCOSE, CAPILLARY     Status: Abnormal   Collection Time    03/18/14  5:09 PM      Result Value Ref Range   Glucose-Capillary 110 (*) 70 - 99 mg/dL   Comment 1 Notify RN    TSH     Status: None    Collection Time    03/19/14  6:30 AM      Result Value Ref Range   TSH 2.400  0.350 - 4.500 uIU/mL   Comment: Performed at [place], CAPILLARY     Status: Abnormal   Collection Time    03/19/14  6:31 AM      Result Value Ref Range   Glucose-Capillary 140 (*) 70 - 99 mg/dL    Physical Findings: AIMS: Facial and Oral Movements Muscles of Facial Expression: None, normal Lips and Perioral Area: None, normal Jaw: None, normal Tongue: None, normal,Extremity Movements Upper (arms, wrists, hands, fingers): None, normal Lower (legs, knees, ankles, toes): None, normal, Trunk Movements Neck, shoulders, hips: None, normal, Overall Severity Severity of abnormal movements (highest score from questions above): None, normal Incapacitation due to abnormal movements: None, normal Patient's awareness of abnormal movements (rate only patient's report): No Awareness, Dental Status Current problems with teeth and/or dentures?: No Does patient usually wear dentures?: No  CIWA:    COWS:     Assessment : Patient remains depressed , but partially better insofar as her mood. She is quite anxious, and she reports a long history of social anxiety/ phobia, which causes significant ruminations about her social interactions and what others think of her. She has been on Ativan for years, and denies abuse or misuse. She is not currently suicidal or psychotic . She is tolerating Effexor XR well. ( recently titrated)  Treatment Plan Summary: Daily contact with patient to assess and evaluate symptoms and progress in treatment Medication management See below  Plan: For now continue Ativan 1 mgr TID- will work on slowly tapering it down. Continue Effexor XR at 225 mgrs QDAY.  Trazodone PRN for insomnia No clear indication for low dose Risperidone- patient not psychotic. We discussed and we agreed to D/C   Medical Decision Making Problem Points:  Established problem, stable/improving (1)  and Review of psycho-social stressors (1) Data Points:  Review of medication regiment & side effects (2) Review of new medications or change in dosage (2)  I certify that inpatient services furnished can reasonably be expected to improve the patient's condition.   Philicia Heyne 03/19/2014, 3:06 PM
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
## 2014-03-19 NOTE — Progress Notes (Signed)
Patient ID: Lauren [name], female   DOB: [birth year], 63 y.o.   MRN: [id number]  D: Pt. Denies SI/HI and A/V Hallucinations. Patient rates her depression and hopelessness at 4/10 for the day. Patient does not report any pain or discomfort at this time.  Patient reports that she slept well last night and her energy level is normal. Patient reports that her level of concentration is poor today. Patient reports that her goal for today is to attend groups. Patient reports that she will meet her goal by getting up and going to groups.   A: Support and encouragement provided to the patient. Scheduled medications administered to patient per physician's orders except for Ativan. Patient reports, "I don't take Ativan."  R: Patient is receptive and cooperative but minimal with this Probation officer. Patient is seen in the milieu and is attending groups. Patient  Q15 minute checks are maintained for safety.

## 2014-03-20 LAB — GLUCOSE, CAPILLARY
GLUCOSE-CAPILLARY: 121 mg/dL — AB (ref 70–99)
Glucose-Capillary: 119 mg/dL — ABNORMAL HIGH (ref 70–99)

## 2014-03-20 MED ORDER — LORAZEPAM 1 MG PO TABS
1.0000 mg | ORAL_TABLET | Freq: Once | ORAL | Status: AC
  Administered 2014-03-20: 1 mg via ORAL
  Filled 2014-03-20: qty 1

## 2014-03-20 MED ORDER — LORAZEPAM 1 MG PO TABS
1.0000 mg | ORAL_TABLET | Freq: Two times a day (BID) | ORAL | Status: DC
  Administered 2014-03-21: 1 mg via ORAL
  Filled 2014-03-20: qty 1

## 2014-03-20 MED ORDER — TRAZODONE 25 MG HALF TABLET
25.0000 mg | ORAL_TABLET | Freq: Every day | ORAL | Status: DC
  Administered 2014-03-20: 25 mg via ORAL
  Filled 2014-03-20 (×2): qty 1
  Filled 2014-03-20: qty 2

## 2014-03-20 NOTE — Progress Notes (Signed)
Patient ID: Lauren [name], female   DOB: 1951-05-07, 63 y.o.   MRN: [id number] [facility] MD Progress Note  03/20/2014 3:02 PM Lauren [name]  MRN:  [id number] Subjective:   States she likes trazodone , which helps her sleep better, but states that current dose causes excessive AM sedation. Objective: At this time reports improvement. States she is feeling less depressed, and states she feels more optimistic and hopeful. She states she is feeling ready for discharge soon and is looking forward to reunite with her husband and go back home. States her husband has come to visit her, and has told her she seems to be better and is hoping she is discharged soon. Continues to want to gradually taper off Ativan, which she has been on for  Many years( denies any abuse or misuse) , due to concern that it might negatively affect her memory and cognition as she grows older.  We discussed the rationale to taper off this medication gradually over a period of weeks , to minimize risk of WDL or rebound anxiety. Will taper Ativan to 1 mgr BID. Has been going to groups and is a good group participant.  Regarding history of alcohol dependence, patient has been sober for many years. She states" I need to start going to [place] meetings again" and states she will do this when she is discharged. Denies medication side effects. TSH Normal Diagnosis:  Major Depression, Recurrent severe, Alcohol Dependence in sustained remission   Total Time spent with patient: 20 minutes   ADL's: fair  Sleep:  Slept better last night  Appetite:  Fair  Suicidal Ideation:  Denies any suicidal ideations  Homicidal Ideation:  Denies any homicidal ideations AEB (as evidenced by):  Psychiatric Specialty Exam: Physical Exam  Review of Systems  Constitutional: Negative for fever and chills.  Respiratory: Negative for cough and shortness of breath.   Cardiovascular: Negative for chest pain.  Psychiatric/Behavioral: Positive for  depression. The patient is nervous/anxious.     Blood pressure 116/75, pulse 108, temperature 98.5 F (36.9 C), temperature source Oral, resp. rate 16, height 5\' 6"  (1.676 m), weight 63.957 kg (141 lb).Body mass index is 22.77 kg/(m^2).  General Appearance: Better groomed  Eye Contact::  Good  Speech:  Normal Rate  Volume:  Normal  Mood:  Improving , less anxious   Affect:  Affect brighter, more reactive, smiles often and appropriately  Thought Process:  Goal Directed and Linear  Orientation:  NA- fully alert and attentive  Thought Content:  denies any hallucinations, no delusions  Suicidal Thoughts:  No- denies any current suicidal ideations, no homicidal ideations, contracts for safety on the unit  Homicidal Thoughts:  No  Memory:  NA  Judgement:  Fair  Insight:  improving  Psychomotor Activity:  Normal  Concentration:  Good  Recall:  Good  Fund of Knowledge:Good  Language: Good  Akathisia:  Negative  Handed:  Right  AIMS (if indicated):     Assets:  Communication Skills Resilience  Sleep:  Number of Hours: 6.75   Musculoskeletal: Strength & Muscle Tone: within normal limits Gait & Station: normal Patient leans: N/A  Current Medications: Current Facility-Administered Medications  Medication Dose Route Frequency Provider Last Rate Last Dose  . acetaminophen (TYLENOL) tablet 650 mg  650 mg Oral Q6H PRN Encarnacion Slates, NP      . albuterol (PROVENTIL HFA;VENTOLIN HFA) 108 (90 BASE) MCG/ACT inhaler 2 puff  2 puff Inhalation Q4H PRN Benjamine Mola, FNP      .  alum & mag hydroxide-simeth (MAALOX/MYLANTA) 200-200-20 MG/5ML suspension 30 mL  30 mL Oral Q4H PRN Encarnacion Slates, NP      . calcium-vitamin D (OSCAL WITH D) 500-200 MG-UNIT per tablet 1 tablet  1 tablet Oral BID Benjamine Mola, FNP   1 tablet at 03/20/14 0758  . diphenoxylate-atropine (LOMOTIL) 2.5-0.025 MG per tablet 1 tablet  1 tablet Oral QID PRN Benjamine Mola, FNP      . LORazepam (ATIVAN) tablet 1 mg  1 mg Oral 3  times per day Benjamine Mola, FNP   1 mg at 03/19/14 2120  . magnesium hydroxide (MILK OF MAGNESIA) suspension 30 mL  30 mL Oral Daily PRN Encarnacion Slates, NP      . methocarbamol (ROBAXIN) tablet 500 mg  500 mg Oral QHS Benjamine Mola, FNP   500 mg at 03/19/14 2120  . simvastatin (ZOCOR) tablet 10 mg  10 mg Oral q1800 Benjamine Mola, FNP   10 mg at 03/19/14 1705  . traZODone (DESYREL) tablet 50 mg  50 mg Oral QHS Encarnacion Slates, NP   50 mg at 03/19/14 2120  . venlafaxine XR (EFFEXOR-XR) 24 hr capsule 225 mg  225 mg Oral Q breakfast Neita Garnet, MD   225 mg at 03/20/14 8341    Lab Results:  Results for orders placed during the hospital encounter of 03/18/14 (from the past 48 hour(s))  GLUCOSE, CAPILLARY     Status: Abnormal   Collection Time    03/18/14  5:09 PM      Result Value Ref Range   Glucose-Capillary 110 (*) 70 - 99 mg/dL   Comment 1 Notify RN    TSH     Status: None   Collection Time    03/19/14  6:30 AM      Result Value Ref Range   TSH 2.400  0.350 - 4.500 uIU/mL   Comment: Performed at [place], CAPILLARY     Status: Abnormal   Collection Time    03/19/14  6:31 AM      Result Value Ref Range   Glucose-Capillary 140 (*) 70 - 99 mg/dL  GLUCOSE, CAPILLARY     Status: Abnormal   Collection Time    03/19/14  4:54 PM      Result Value Ref Range   Glucose-Capillary 120 (*) 70 - 99 mg/dL   Comment 1 Notify RN    GLUCOSE, CAPILLARY     Status: Abnormal   Collection Time    03/20/14  5:59 AM      Result Value Ref Range   Glucose-Capillary 119 (*) 70 - 99 mg/dL    Physical Findings: AIMS: Facial and Oral Movements Muscles of Facial Expression: None, normal Lips and Perioral Area: None, normal Jaw: None, normal Tongue: None, normal,Extremity Movements Upper (arms, wrists, hands, fingers): None, normal Lower (legs, knees, ankles, toes): None, normal, Trunk Movements Neck, shoulders, hips: None, normal, Overall Severity Severity of abnormal  movements (highest score from questions above): None, normal Incapacitation due to abnormal movements: None, normal Patient's awareness of abnormal movements (rate only patient's report): No Awareness, Dental Status Current problems with teeth and/or dentures?: No Does patient usually wear dentures?: No  CIWA:    COWS:     Assessment : Patient is significantly improved today- less anxious, less depressed, more future oriented, stating she wants to return home soon and start going to AA again. She is tolerating Effexor XR well. She wants to  taper Trazodone dose down to minimize AM sedation, but not stop it altogether as it does help her sleep. She wants to initiate an Ativan taper. Has been on Ativan for years, and usually takes between 2-3 mgrs per day.  Treatment Plan Summary: Daily contact with patient to assess and evaluate symptoms and progress in treatment Medication management See below  Plan: Taper Ativan  To 1 mgr BID- will work on slowly tapering it down. Continue Effexor XR at 225 mgrs QDAY.  Taper Trazodone to 25 mgrs QHS  PRN for insomnia Treatment team working on disposition planning.   Medical Decision Making Problem Points:  Established problem, stable/improving (1) and Review of psycho-social stressors (1) Data Points:  Review of medication regiment & side effects (2) Review of new medications or change in dosage (2)  I certify that inpatient services furnished can reasonably be expected to improve the patient's condition.   COBOS, FERNANDO 03/20/2014, 3:02 PM

## 2014-03-20 NOTE — Progress Notes (Signed)
Patient ID: Lauren [name], female   DOB: [birth year], 63 y.o.   MRN: [id number]  0900 Morning Wellness Group 0900  The focus of this group is to educate the patient on the purpose and policies of crisis stabilization and provide a format to answer questions about their admission.  The group details unit policies and expectations of patients while admitted.  Patient attended group and participated in morning stretching exercises. Patient was engaged in group and reports that she has a lot of goal but her main one she is focusing on is not isolating herself. Patient reports that she will be more engaged in the milieu and get more active after discharge.

## 2014-03-20 NOTE — Progress Notes (Signed)
Patient ID: Lauren [name], female   DOB: [birth year], 62 y.o.   MRN: [id number]  D: Pt. Denies SI/HI and A/V Hallucinations. Patient reports that her anxiety is 3/10 for the day as well as hopelessness and depression. Patient reported mild pain in back and received a heat pack for the discomfort. Patient reports that she slept well, her appetite is good and her concentration is good.   A: Support and encouragement provided to the patient. Scheduled medications administered to patient per physician's orders.  R: Patient is receptive and cooperative but minimal with this Probation officer. Patient is seen in the milieu and attending groups. Q15 minute checks are maintained for safety.

## 2014-03-20 NOTE — Progress Notes (Signed)
D: Pt denies SI/HI/AVH. Pt is pleasant and cooperative. Pt said she was going to start back to [employer], but feels a little anxious about starting a new group with new people. The last group stopped after 10+ yrs.   A: Pt was offered support and encouragement. Pt was given scheduled medications. Pt was encourage to attend groups. Q 15 minute checks were done for safety.   R:Pt attends groups and interacts well with peers and staff. Pt is taking medication. Pt has no complaints at this time.Pt receptive to treatment and safety maintained on unit.

## 2014-03-20 NOTE — Progress Notes (Signed)
Adult Psychoeducational Group Note  Date:  03/20/2014 Time:  10:00am Group Topic/Focus:  Making Healthy Choices:   The focus of this group is to help patients identify negative/unhealthy choices they were using prior to admission and identify positive/healthier coping strategies to replace them upon discharge.  Participation Level:  Active  Participation Quality:  Appropriate and Attentive  Affect:  Appropriate  Cognitive:  Alert and Appropriate  Insight: Appropriate  Engagement in Group:  Engaged  Modes of Intervention:  Discussion and Education  Additional Comments:   Pt attended and participated in group.  The question was asked what is your biggest weakness? Pt stated negative self talk.  Marlowe [place] D 03/20/2014, 10:39 AM

## 2014-03-20 NOTE — BHH Group Notes (Signed)
[facility] LCSW Group Therapy  03/20/2014  1:15 PM   Type of Therapy:  Group Therapy  Participation Level:  Active  Participation Quality:  Attentive, Sharing and Supportive  Affect:  Depressed and Flat  Cognitive:  Alert and Oriented  Insight:  Developing/Improving and Engaged  Engagement in Therapy:  Developing/Improving and Engaged  Modes of Intervention:  Clarification, Confrontation, Discussion, Education, Exploration, Limit-setting, Orientation, Problem-solving, Rapport Building, Art therapist, Socialization and Support  Summary of Progress/Problems: The topic for group was balance in life.  Today's group focused on defining balance in one's own words, identifying things that can knock one off balance, and exploring healthy ways to maintain balance in life. Group members were asked to provide an example of a time when they felt off balance, describe how they handled that situation,and process healthier ways to regain balance in the future. Group members were asked to share the most important tool for maintaining balance that they learned while at [facility] and how they plan to apply this method after discharge.  Pt states that her life has been unbalanced due to all of the losses with her support systems.  Pt states that she knows she needs to get reinstated with AA and set a routine to get her life balanced again.  Pt was able to relate to peers in being sober for 24 years and how she continues to struggle even after all of this time.  Pt actively participated and was engaged in group discussion.    Regan Lemming, LCSW 03/20/2014  2:47 PM

## 2014-03-21 LAB — GLUCOSE, CAPILLARY: GLUCOSE-CAPILLARY: 135 mg/dL — AB (ref 70–99)

## 2014-03-21 MED ORDER — TRAZODONE 25 MG HALF TABLET: 25.0000 mg | ORAL_TABLET | Freq: Every day | ORAL | Status: DC

## 2014-03-21 MED ORDER — LORAZEPAM 1 MG PO TABS: 1.0000 mg | ORAL_TABLET | Freq: Two times a day (BID) | ORAL | Status: DC

## 2014-03-21 MED ORDER — DIPHENOXYLATE-ATROPINE 2.5-0.025 MG PO TABS: 1.0000 | ORAL_TABLET | Freq: Four times a day (QID) | ORAL | Status: DC | PRN

## 2014-03-21 MED ORDER — VENLAFAXINE HCL ER 75 MG PO CP24: 225.0000 mg | ORAL_CAPSULE | Freq: Every day | ORAL | Status: DC

## 2014-03-21 MED ORDER — CALCIUM CARBONATE-VITAMIN D 500-200 MG-UNIT PO TABS: 1.0000 | ORAL_TABLET | Freq: Two times a day (BID) | ORAL | Status: DC

## 2014-03-21 MED ORDER — PRAVASTATIN SODIUM 20 MG PO TABS: 20.0000 mg | ORAL_TABLET | Freq: Every day | ORAL | Status: DC

## 2014-03-21 MED ORDER — ALBUTEROL SULFATE HFA 108 (90 BASE) MCG/ACT IN AERS: 2.0000 | INHALATION_SPRAY | RESPIRATORY_TRACT | Status: DC | PRN

## 2014-03-21 MED ORDER — METHOCARBAMOL 500 MG PO TABS: 500.0000 mg | ORAL_TABLET | Freq: Every day | ORAL | Status: DC

## 2014-03-21 NOTE — BHH Suicide Risk Assessment (Addendum)
Demographic Factors:  63 year old caucasian female, married, lives with husband, on disability  Total Time spent with patient: 30 minutes  Psychiatric Specialty Exam: Physical Exam  ROS  Blood pressure 123/78, pulse 104, temperature 98.6 F (37 C), temperature source Oral, resp. rate 20, height 5\' 6"  (1.676 m), weight 63.957 kg (141 lb).Body mass index is 22.77 kg/(m^2).  General Appearance: improved grooming  Eye Contact::  Good  Speech:  Normal Rate  Volume:  Normal  Mood:  improved mood .   Affect:  Appropriate and fuller in range  Thought Process:  Goal Directed and Linear  Orientation:  Full (Time, Place, and Person)  Thought Content:  no hallucinations,no delusions  Suicidal Thoughts:  No- denies any suicidal or homicidal ideations, contracts for safety  Homicidal Thoughts:  No  Memory:  NA  Judgement:  Good  Insight:  Fair  Psychomotor Activity:  Normal  Concentration:  Negative  Recall:  Good- of note, patient has expressed concern about cognitive decline. She is fully oriented x 3, and I did a MMSE today, and she scored 30/30. She was reassured.  Fund of Knowledge:Good  Language: Good  Akathisia:  No  Handed:  Right  AIMS (if indicated):     Assets:  Communication Skills Housing Resilience Social Support  Sleep:  Number of Hours: 6.75    Musculoskeletal: Strength & Muscle Tone: within normal limits Gait & Station: normal Patient leans: N/A   Mental Status Per Nursing Assessment::   On Admission:  Suicidal ideation indicated by patient;Self-harm thoughts  Current Mental Status by Physician: Mood and affect improved. No thought disorder, no suicidal or homicidal ideations  Loss Factors: On disability  Historical Factors: History of depression, history of alcohol dependence, now sober for many years. A suicide attempt many years ago, when drinking  Risk Reduction Factors:   Living with another person, especially a relative, Positive social support  and Positive coping skills or problem solving skills  Continued Clinical Symptoms:  Mood and affect significantly improved upon discharge  Cognitive Features That Contribute To Risk:  No gross cognitive deficits noted upon discharge  Suicide Risk:  Mild:  Suicidal ideation of limited frequency, intensity, duration, and specificity.  There are no identifiable plans, no associated intent, mild dysphoria and related symptoms, good self-control (both objective and subjective assessment), few other risk factors, and identifiable protective factors, including available and accessible social support.  Discharge Diagnoses:   AXIS I:  Major Depression, Recurrent severe, Alcohol Dependence in sustained remission, Consider Social Phobia AXIS II:  Deferred AXIS III:   Past Medical History  Diagnosis Date  . Depression   . Osteoarthritis   . Fibromyalgia   . IBS (irritable bowel syndrome)   . GERD (gastroesophageal reflux disease)   . Prediabetes   . Chronic pain    AXIS IV:  occupational problems- on disability AXIS V:  51-60 moderate symptoms ( 60 upon discharge)   Plan Of Care/Follow-up recommendations:  Activity:  as tolerated Diet:  low carbohydrate diet  Tests:  NA Other:  See below  Is patient on multiple antipsychotic therapies at discharge:  No   Has Patient had three or more failed trials of antipsychotic monotherapy by history:  No  Recommended Plan for Multiple Antipsychotic Therapies: NA  Patient plans to return home to her husband. Follow up with [facility] On 03/28/2014. (Appointment scheduled at 2:45 pm on this date with Dr. Sima Matas for therapy)  Follow up with [facility]  Outpatient On 04/21/2014. (Appointment scheduled at 1:00 pm on this date with Dr. Harrington Challenger for medication management)  Follow up at [facility] ( Dr. Roseanne Reno) for medical issues as needed  Patient plans to discuss gradual Ativan taper with her outpatient psychiatrist.    Neita Garnet 03/21/2014, 10:23 AM

## 2014-03-21 NOTE — Discharge Summary (Signed)
Physician Discharge Summary Note  Patient:  Lauren [name] is an 63 y.o., female MRN:  [id number] DOB:  12/25/1950 Patient phone:  [phone] (home)  Patient address:   [address] [place] [address],  Total Time spent with patient: Greater than 30 minutes  Date of Admission:  03/18/2014 Date of Discharge: 03/21/14  Reason for Admission: Mood stabilization  Discharge Diagnoses: Active Problems:   MDD (major depressive disorder)   Psychiatric Specialty Exam: Physical Exam  Psychiatric: Her speech is normal and behavior is normal. Judgment and thought content normal. Her mood appears not anxious. Her affect is not angry, not blunt, not labile and not inappropriate. Cognition and memory are normal. She does not exhibit a depressed mood.    Review of Systems  Constitutional: Negative.   HENT: Negative.   Eyes: Negative.   Respiratory: Negative.   Cardiovascular: Negative.   Gastrointestinal: Negative.   Genitourinary: Negative.   Musculoskeletal: Negative.   Skin: Negative.   Neurological: Negative.   Endo/Heme/Allergies: Negative.   Psychiatric/Behavioral: Positive for depression (Stable). Negative for suicidal ideas, hallucinations, memory loss and substance abuse. The patient has insomnia (Stable). The patient is not nervous/anxious.     Blood pressure 123/78, pulse 104, temperature 98.6 F (37 C), temperature source Oral, resp. rate 20, height 5\' 6"  (1.676 m), weight 63.957 kg (141 lb).Body mass index is 22.77 kg/(m^2).   General Appearance: improved grooming   Eye Contact:: Good   Speech: Normal Rate   Volume: Normal   Mood: improved mood .   Affect: Appropriate and fuller in range   Thought Process: Goal Directed and Linear   Orientation: Full (Time, Place, and Person)   Thought Content: no hallucinations,no delusions   Suicidal Thoughts: No- denies any suicidal or homicidal ideations, contracts for safety   Homicidal Thoughts: No   Memory: NA   Judgement:  Good   Insight: Fair   Psychomotor Activity: Normal   Concentration: Negative   Recall: Good- of note, patient has expressed concern about cognitive decline. She is fully oriented x 3, and I did a MMSE today, and she scored 30/30. She was reassured.   Fund of Knowledge:Good   Language: Good   Akathisia: No   Handed: Right   AIMS (if indicated):   Assets: Communication Skills  Housing  Resilience  Social Support   Sleep: Number of Hours: 6.75    Past Psychiatric History: Diagnosis: Major Depression, Recurrent severe  Hospitalizations: [facility] adult unit  Outpatient Care: [facility]  Substance Abuse Care: NA  Self-Mutilation: NA  Suicidal Attempts: NA  Violent Behaviors: NA   Musculoskeletal: Strength & Muscle Tone: within normal limits Gait & Station: normal Patient leans: N/A  DSM5: Schizophrenia Disorders:  NA Obsessive-Compulsive Disorders:  NA Trauma-Stressor Disorders:  NA Substance/Addictive Disorders:  NA Depressive Disorders: Major depressive disorder,    Axis Diagnosis:   AXIS I:  Major Depression, Recurrent severe AXIS II:  Deferred AXIS III:   Past Medical History  Diagnosis Date  . Depression   . Osteoarthritis   . Fibromyalgia   . IBS (irritable bowel syndrome)   . GERD (gastroesophageal reflux disease)   . Prediabetes   . Chronic pain    AXIS IV:  other psychosocial or environmental problems and mental illness, chronic AXIS V:  63  Level of Care:  OP  Hospital Course:   63 year old woman, who reports she has been feeling Increasingly depressed and anxious . She states she has been feeling "  very negative" , with feelings of guilt , worthlessness, regrets. States " I used to go to church and AA , but not recently". States she feels guilty for her stepson committing suicide about 11 years ago. Recently has been having vague suicidal ideations, feeling she would be better off dead, and feeling Like a burden to family.  Lauren [name]  was admitted to the hospital due to worsening depression coupled with suicidal ideation and guilt. She was in need of mood re-stabilization. This was achieved using medication management and group counseling services. Lauren [name] was medicated and discharged on Effexor XR 75 mg daily for depression, Lorazepam 1 mg twice daily for anxiety and Trazodone 25 mg Q bedtime for sleep. She was resumed and discharged on all her pertinent home medications for her other pre-existing medical issues. She learned coping skills from her participation in group sessions.  Lauren [name] has shown a stable mood.  This is evidenced by her reports of improved mood and absence of suicidal ideations. She is currently being discharged to continue psychiatric care at [facility] in [place], [place]. She is provided with a 4 days worth supply samples of her [facility] discharge medications. Upon discharge, she adamantly denies any SIHI, AVH, delusions and or paranoia. She left [facility] with all personal belongings in no distress. Transportation per husband.  Consults:  psychiatry  Significant Diagnostic Studies:  labs: CBC with diff, CMP, UDS, toxicology tests, U/A  Discharge Vitals:   Blood pressure 123/78, pulse 104, temperature 98.6 F (37 C), temperature source Oral, resp. rate 20, height 5\' 6"  (1.676 m), weight 63.957 kg (141 lb). Body mass index is 22.77 kg/(m^2). Lab Results:   Results for orders placed during the hospital encounter of 03/18/14 (from the past 72 hour(s))  GLUCOSE, CAPILLARY     Status: Abnormal   Collection Time    03/18/14  5:09 PM      Result Value Ref Range   Glucose-Capillary 110 (*) 70 - 99 mg/dL   Comment 1 Notify RN    TSH     Status: None   Collection Time    03/19/14  6:30 AM      Result Value Ref Range   TSH 2.400  0.350 - 4.500 uIU/mL   Comment: Performed at [facility], CAPILLARY     Status: Abnormal   Collection Time    03/19/14  6:31 AM      Result Value Ref  Range   Glucose-Capillary 140 (*) 70 - 99 mg/dL  GLUCOSE, CAPILLARY     Status: Abnormal   Collection Time    03/19/14  4:54 PM      Result Value Ref Range   Glucose-Capillary 120 (*) 70 - 99 mg/dL   Comment 1 Notify RN    GLUCOSE, CAPILLARY     Status: Abnormal   Collection Time    03/20/14  5:59 AM      Result Value Ref Range   Glucose-Capillary 119 (*) 70 - 99 mg/dL  GLUCOSE, CAPILLARY     Status: Abnormal   Collection Time    03/20/14  4:43 PM      Result Value Ref Range   Glucose-Capillary 121 (*) 70 - 99 mg/dL   Comment 1 Notify RN    GLUCOSE, CAPILLARY     Status: Abnormal   Collection Time    03/21/14  6:06 AM      Result Value Ref Range   Glucose-Capillary 135 (*) 70 - 99 mg/dL  Physical Findings: AIMS: Facial and Oral Movements Muscles of Facial Expression: None, normal Lips and Perioral Area: None, normal Jaw: None, normal Tongue: None, normal,Extremity Movements Upper (arms, wrists, hands, fingers): None, normal Lower (legs, knees, ankles, toes): None, normal, Trunk Movements Neck, shoulders, hips: None, normal, Overall Severity Severity of abnormal movements (highest score from questions above): None, normal Incapacitation due to abnormal movements: None, normal Patient's awareness of abnormal movements (rate only patient's report): No Awareness, Dental Status Current problems with teeth and/or dentures?: No Does patient usually wear dentures?: No  CIWA:    COWS:     Psychiatric Specialty Exam: See Psychiatric Specialty Exam and Suicide Risk Assessment completed by Attending Physician prior to discharge.  Discharge destination:  Home  Is patient on multiple antipsychotic therapies at discharge:  No   Has Patient had three or more failed trials of antipsychotic monotherapy by history:  No  Recommended Plan for Multiple Antipsychotic Therapies: NA    Medication List    STOP taking these medications       DULoxetine 30 MG capsule  Commonly known  as:  CYMBALTA     oxyCODONE-acetaminophen 10-325 MG per tablet  Commonly known as:  PERCOCET     risperiDONE 1 MG tablet  Commonly known as:  RISPERDAL      TAKE these medications     Indication   albuterol 108 (90 BASE) MCG/ACT inhaler  Commonly known as:  PROAIR HFA  Inhale 2 puffs into the lungs every 4 (four) hours as needed for wheezing or shortness of breath.   Indication:  Asthma     calcium-vitamin D 500-200 MG-UNIT per tablet  Commonly known as:  OSCAL WITH D  Take 1 tablet by mouth 2 (two) times daily. For low calcium   Indication:  Low Amount of Calcium in the Blood     diphenoxylate-atropine 2.5-0.025 MG per tablet  Commonly known as:  LOMOTIL  Take 1 tablet by mouth 4 (four) times daily as needed for diarrhea or loose stools.   Indication:  Diarrhea     LORazepam 1 MG tablet  Commonly known as:  ATIVAN  Take 1 tablet (1 mg total) by mouth every 12 (twelve) hours. For anxiety   Indication:  Feeling Anxious     methocarbamol 500 MG tablet  Commonly known as:  ROBAXIN  Take 1 tablet (500 mg total) by mouth at bedtime. Muscle pain   Indication:  Musculoskeletal Pain     pravastatin 20 MG tablet  Commonly known as:  PRAVACHOL  Take 1 tablet (20 mg total) by mouth daily. For high cholesterol   Indication:  Inherited Heterozygous Hypercholesterolemia     traZODone 25 mg Tabs tablet  Commonly known as:  DESYREL  Take 0.5 tablets (25 mg total) by mouth at bedtime. For sleep   Indication:  Trouble Sleeping     venlafaxine XR 75 MG 24 hr capsule  Commonly known as:  EFFEXOR-XR  Take 3 capsules (225 mg total) by mouth daily with breakfast. For depression   Indication:  Major Depressive Disorder       Follow-up Information   Follow up with [facility]patient On 03/28/2014. (Appointment scheduled at 2:45 pm on this date with Dr. Sima Matas for therapy)    Contact information:   [place], [place], [place] [address] Phone: [phone]       Follow up with [facility] Outpatient On 04/21/2014. (Appointment scheduled at 1:00 pm on this date with Dr.  Ross for medication management)    Contact information:   [address] [place], [place] [address] Phone: [phone]     Follow-up recommendations:  Activity:  As tolerated Diet: As recommended by your primary care doctor. Keep all scheduled follow-up appointments as recommended.  Comments: Take all your medications as prescribed by your mental healthcare provider. Report any adverse effects and or reactions from your medicines to your outpatient provider promptly. Patient is instructed and cautioned to not engage in alcohol and or illegal drug use while on prescription medicines. In the event of worsening symptoms, patient is instructed to call the crisis hotline, 911 and or go to the nearest ED for appropriate evaluation and treatment of symptoms. Follow-up with your primary care provider for your other medical issues, concerns and or health care needs.  Total Discharge Time:  Greater than 30 minutes.  Signed: Encarnacion Slates, [place] 03/21/2014, 3:55 PM   Patient seen, Suicide Assessment Completed.  Disposition Plan Reviewed

## 2014-03-21 NOTE — Tx Team (Signed)
Interdisciplinary Treatment Plan Update (Adult)  Date: 03/21/2014  Time Reviewed:  9:45 AM  Progress in Treatment: Attending groups: Yes Participating in groups:  Yes Taking medication as prescribed:  Yes Tolerating medication:  Yes Family/Significant othe contact made: No, CSW attempting to reach pt's husband Patient understands diagnosis:  Yes Discussing patient identified problems/goals with staff:  Yes Medical problems stabilized or resolved:  Yes Denies suicidal/homicidal ideation: Yes Issues/concerns per patient self-inventory:  Yes Other:  New problem(s) identified: N/A  Discharge Plan or Barriers: Pt will follow up at [facility] for medication management and therapy.    Reason for Continuation of Hospitalization: Stable to d/c today  Comments: N/A  Estimated length of stay: D/C today  For review of initial/current patient goals, please see plan of care.  Attendees: Patient:     Family:     Physician:  Dr. Parke Poisson 03/21/2014 9:26 AM   Nursing:  Vira Browns, RN 03/21/2014 9:26 AM   Clinical Social Worker:  Regan Lemming, LCSW 03/21/2014 9:26 AM   Other:   Other:     Other:     Other:     Other:    Other:    Other:    Other:    Other:      Scribe for Treatment Team:   Ane Payment, 03/21/2014 , 9:26 AM

## 2014-03-21 NOTE — Progress Notes (Signed)
Pt was just discharged. Her husband picked her up. Pt was given her meds and RX . She denies feeling SI and HI and contracts for safety.

## 2014-03-21 NOTE — BHH Group Notes (Signed)
[facility] LCSW Aftercare Discharge Planning Group Note   03/21/2014 8:45 AM  Participation Quality:  Alert, Appropriate and Oriented  Mood/Affect:  Calm  Depression Rating:  3  Anxiety Rating:  3  Thoughts of Suicide:  Pt denies SI/HI  Will you contract for safety?   Yes  Current AVH:  Pt denies  Plan for Discharge/Comments:  Pt attended discharge planning group and actively participated in group.  CSW provided pt with today's workbook.  Pt reports feeling sleepy but ready to d/c.  Pt will return home in [place] with her husband and has follow up scheduled with [facility] for medication management and therapy.  No further needs voiced by pt at this time.    Transportation Means: Pt reports access to transportation - husband will pick pt up  Supports: husband is main [place], LCSW 03/21/2014 9:19 AM

## 2014-03-21 NOTE — BHH Suicide Risk Assessment (Signed)
[facility] INPATIENT:  Family/Significant Other Suicide Prevention Education  Suicide Prevention Education:  Education Completed; Lauren [name] - husband [phone]),  (name of family member/significant other) has been identified by the patient as the family member/significant other with whom the patient will be residing, and identified as the person(s) who will aid the patient in the event of a mental health crisis (suicidal ideations/suicide attempt).  With written consent from the patient, the family member/significant other has been provided the following suicide prevention education, prior to the and/or following the discharge of the patient.  The suicide prevention education provided includes the following:  Suicide risk factors  Suicide prevention and interventions  National Suicide Hotline telephone number  [facility] assessment telephone number  [facility] Emergency Assistance [place] and/or Residential Mobile Crisis Unit telephone number  Request made of family/significant other to:  Remove weapons (e.g., guns, rifles, knives), all items previously/currently identified as safety concern.  Pt's husband states that he has a gun but will secure it before she d/c today.   Remove drugs/medications (over-the-counter, prescriptions, illicit drugs), all items previously/currently identified as a safety concern.  The family member/significant other verbalizes understanding of the suicide prevention education information provided.  The family member/significant other agrees to remove the items of safety concern listed above.  Ane Payment 03/21/2014, 10:03 AM

## 2014-03-21 NOTE — Progress Notes (Signed)
[facility] Adult Case Management Discharge Plan :  Will you be returning to the same living situation after discharge: Yes,  returning home in [place], husband is supportive At discharge, do you have transportation home?:Yes,  husband will pick pt up Do you have the ability to pay for your medications:Yes,  provided pt prescriptions and pt verbalizes ability to afford meds  Release of information consent forms completed and in the chart;  Patient's signature needed at discharge.  Patient to Follow up at: Follow-up Information   Follow up with [facility] On 03/28/2014. (Appointment scheduled at 2:45 pm on this date with Dr. Sima Matas for therapy)    Contact information:   [place], [place], [place] [address] Phone: [phone]      Follow up with [facility] On 04/21/2014. (Appointment scheduled at 1:00 pm on this date with Dr. Harrington Challenger for medication management)    Contact information:   [place], [place], [place] [address] Phone: [phone]      Patient denies SI/HI:   Yes,  denies SI/HI    Safety Planning and Suicide Prevention discussed:  Yes,  discussed with pt and pt's husband.  See suicide prevention education note.   Ane Payment 03/21/2014, 9:40 AM

## 2014-03-25 NOTE — Progress Notes (Signed)
Patient Discharge Instructions:  Next Level Care Provider Has Access to the EMR, 03/25/14 Records provided to [facility] via [facility]/Epic access.  Lauren [name], 03/25/2014, 3:20 PM

## 2014-03-28 ENCOUNTER — Ambulatory Visit (INDEPENDENT_AMBULATORY_CARE_PROVIDER_SITE_OTHER): Payer: Medicare Other | Admitting: Psychology

## 2014-03-28 DIAGNOSIS — F39 Unspecified mood [affective] disorder: Secondary | ICD-10-CM

## 2014-03-28 DIAGNOSIS — F419 Anxiety disorder, unspecified: Secondary | ICD-10-CM

## 2014-03-28 DIAGNOSIS — F411 Generalized anxiety disorder: Secondary | ICD-10-CM

## 2014-04-02 ENCOUNTER — Encounter (HOSPITAL_COMMUNITY): Payer: Self-pay | Admitting: Psychology

## 2014-04-02 NOTE — Progress Notes (Signed)
    PROGRESS NOTE   Patient:  Lauren [name]   DOB: 01-14-1951  MR Number: [id number]  Location: [facility]- [address] [place] [address] Dept: [phone]  Start: 3 PM End: 4 PM  Provider/Observer:     Edgardo Roys PSYD  Chief Complaint:      Chief Complaint  Patient presents with  . Anxiety  . Depression  . Stress  . Trauma    Reason For Service:     The patient has a long history of major depressive condition and ongoing underlying depressive disorder. She is a recovering alcoholic but has many many years this Friday. Significant symptoms of anxiety have played a major role in substance abuse in the past. Significant medical problems and her inability to work have complicated her situation. When all these difficulties are put in the context and relationship to potential workability it is clear that she is disabled not do not expect her to return to gainful employment.  Interventions Strategy:  Cognitive/behavioral psychotherapy  Participation Level:   Active  Participation Quality:  Appropriate      Behavioral Observation:  Well Groomed, Alert, and Appropriate.   Current Psychosocial Factors: The patient comes in along with her husband. She described her recent hospitalization after becoming overwhelmed with severe symptoms of depression and anxiety. This was building for some time but the patient feels like her hospitalization did help to some degree. The patient did develop suicidal ideation. The patient's husband reports that he has been trying to do everything he can but he becomes overwhelmed by the situation with his wife's increasing and worsening depression. This is been building since she had to quit her job do to affect her physically.  Content of Session:   Review current symptoms and continue to work on therapeutic interventions for improved coping skills and  strategies.  Current Status:   The patient reports that she has been iridescence for discharge from the inpatient unit. She reports of the suicidal ideation or not there but that she continues to be overwhelmed with intrusive thoughts and anxiety and pervasive feelings of guilt.  Patient Progress:   Stable and improving  Target Goals:   Reduce the frequency of depressive events including feelings of hopelessness and helplessness, anhedonia, social withdrawal, as well as significant anxiety symptoms related to social avoidance, fear of abandonment and fear of inability to manage and cope with her world. We will also look at continuing complete sobriety.  Last Reviewed:   03/28/2014   Goals Addressed Today:    Target goals addressed today have to do with issues of depression in particular feelings of helplessness and hopelessness.   Impression/Diagnosis:   The patient has a long history of depressive disorder and underlying anxiety disorder. She has had numerous medical issues including severe fibromyalgia and severe arthritis. She has been diagnosed with significant arthritis throughout her body and in particular her hands and her feet. Numerous events and left are overwhelmed and unable to work. The patient has had periodic times of improvement for primary symptoms but there are other times where the symptoms are so problematic that she has not been able to maintain gainful employment.  Diagnosis:    Axis I:  Mood disorder  Anxiety    Yazan Gatling R, PsyD 04/02/2014

## 2014-04-16 ENCOUNTER — Ambulatory Visit (INDEPENDENT_AMBULATORY_CARE_PROVIDER_SITE_OTHER): Payer: Medicare Other | Admitting: Psychology

## 2014-04-16 ENCOUNTER — Encounter (HOSPITAL_COMMUNITY): Payer: Self-pay | Admitting: Psychology

## 2014-04-16 ENCOUNTER — Telehealth (HOSPITAL_COMMUNITY): Payer: Self-pay | Admitting: *Deleted

## 2014-04-16 DIAGNOSIS — F419 Anxiety disorder, unspecified: Secondary | ICD-10-CM

## 2014-04-16 DIAGNOSIS — F411 Generalized anxiety disorder: Secondary | ICD-10-CM

## 2014-04-16 DIAGNOSIS — F39 Unspecified mood [affective] disorder: Secondary | ICD-10-CM

## 2014-04-16 NOTE — Telephone Encounter (Signed)
Send in Trazodone 50 mg until visit

## 2014-04-16 NOTE — Telephone Encounter (Signed)
Pt came in to see Dr. Sima Matas today 04-16-14 and stated she was out of her Trazodone. Pt last refill was 03-21-14 and has an appt with Dr. Harrington Challenger 04-21-14. Informed pt that her last refill should have lasted her until her appt with Dr. Harrington Challenger. Pt then stated she is doubling her Trazodone per day and have not informed Dr. Harrington Challenger yet. Informed pt that I have to talk to Dr. Harrington Challenger about refilling her Trazodone. Pt agreed.

## 2014-04-16 NOTE — Progress Notes (Signed)
    PROGRESS NOTE   Patient:  Lauren [name]   DOB: 1951-01-01  MR Number: [id number]  Location: [facility] [address] [place] [address] Dept: [phone]  Start: 2 PM End: 3 PM  Provider/Observer:     Edgardo Roys PSYD  Chief Complaint:      Chief Complaint  Patient presents with  . Anxiety  . Depression  . Agitation  . Stress    Reason For Service:     The patient has a long history of major depressive condition and ongoing underlying depressive disorder. She is a recovering alcoholic but has many many years this Friday. Significant symptoms of anxiety have played a major role in substance abuse in the past. Significant medical problems and her inability to work have complicated her situation. When all these difficulties are put in the context and relationship to potential workability it is clear that she is disabled not do not expect her to return to gainful employment.  Interventions Strategy:  Cognitive/behavioral psychotherapy  Participation Level:   Active  Participation Quality:  Appropriate      Behavioral Observation:  Well Groomed, Alert, and Appropriate.   Current Psychosocial Factors: The patient reports that she is doing much better.  She is responding to the Effexor well.  She has been out to dinner with grand daughter and doing other things out side of home.    Content of Session:   Review current symptoms and continue to work on therapeutic interventions for improved coping skills and strategies.  Current Status:   The patient reports that she has been experiencing improved mood and not as much anxieity, guilt and depression.  Patient Progress:   Stable and improving  Target Goals:   Reduce the frequency of depressive events including feelings of hopelessness and helplessness, anhedonia, social withdrawal, as well as significant anxiety symptoms related to  social avoidance, fear of abandonment and fear of inability to manage and cope with her world. We will also look at continuing complete sobriety.  Last Reviewed:   04/16/2014   Goals Addressed Today:    Target goals addressed today have to do with issues of depression in particular feelings of helplessness and hopelessness.   Impression/Diagnosis:   The patient has a long history of depressive disorder and underlying anxiety disorder. She has had numerous medical issues including severe fibromyalgia and severe arthritis. She has been diagnosed with significant arthritis throughout her body and in particular her hands and her feet. Numerous events and left are overwhelmed and unable to work. The patient has had periodic times of improvement for primary symptoms but there are other times where the symptoms are so problematic that she has not been able to maintain gainful employment.  Diagnosis:    Axis I:  Mood disorder  Anxiety    RODENBOUGH,JOHN R, PsyD 04/16/2014

## 2014-04-18 ENCOUNTER — Other Ambulatory Visit (HOSPITAL_COMMUNITY): Payer: Self-pay | Admitting: *Deleted

## 2014-04-18 MED ORDER — TRAZODONE HCL 50 MG PO TABS: 50.0000 mg | ORAL_TABLET | Freq: Every day | ORAL | Status: DC

## 2014-04-18 NOTE — Telephone Encounter (Signed)
Pt is aware of Dr. Harrington Challenger decision about her Trazodone and pt agreed

## 2014-04-18 NOTE — Telephone Encounter (Signed)
Fax Received. Refill Completed. Lauren [name] (R.M.A)   

## 2014-04-20 ENCOUNTER — Other Ambulatory Visit: Payer: Self-pay | Admitting: Cardiovascular Disease

## 2014-04-21 ENCOUNTER — Ambulatory Visit (HOSPITAL_COMMUNITY): Payer: Self-pay | Admitting: Psychiatry

## 2014-04-23 ENCOUNTER — Telehealth (HOSPITAL_COMMUNITY): Payer: Self-pay | Admitting: *Deleted

## 2014-04-23 ENCOUNTER — Other Ambulatory Visit (HOSPITAL_COMMUNITY): Payer: Self-pay | Admitting: *Deleted

## 2014-04-23 NOTE — Telephone Encounter (Signed)
Called pharmacy to see if pt still have valid refill on file with them and the pharmacist stated there's still one on file for pt. Called pt to inform her that [facility] in [place] still have a valid refill for her to call in. Informed pt she can go to [facility] and pick meds up. Pt agreed.

## 2014-04-23 NOTE — Telephone Encounter (Signed)
Pt calling stating she is out of her Effexor and is out of town. Pt later stated she is aware that she cancelled her last appt with Dr. Harrington Challenger 03-17-14 due to being out of town. Pt next appt with Dr. Harrington Challenger is 04-30-14. [phone].

## 2014-04-23 NOTE — Telephone Encounter (Signed)
Send in enough effexor to cover until appt

## 2014-04-23 NOTE — Telephone Encounter (Signed)
Opened in Error.

## 2014-04-30 ENCOUNTER — Ambulatory Visit (INDEPENDENT_AMBULATORY_CARE_PROVIDER_SITE_OTHER): Payer: Medicare Other | Admitting: Psychiatry

## 2014-04-30 ENCOUNTER — Encounter (HOSPITAL_COMMUNITY): Payer: Self-pay | Admitting: Psychiatry

## 2014-04-30 VITALS — BP 132/81 | HR 88 | Ht 66.0 in | Wt 139.2 lb

## 2014-04-30 DIAGNOSIS — G4700 Insomnia, unspecified: Secondary | ICD-10-CM

## 2014-04-30 DIAGNOSIS — F39 Unspecified mood [affective] disorder: Secondary | ICD-10-CM

## 2014-04-30 DIAGNOSIS — F329 Major depressive disorder, single episode, unspecified: Secondary | ICD-10-CM

## 2014-04-30 DIAGNOSIS — F1010 Alcohol abuse, uncomplicated: Secondary | ICD-10-CM

## 2014-04-30 DIAGNOSIS — F332 Major depressive disorder, recurrent severe without psychotic features: Secondary | ICD-10-CM

## 2014-04-30 DIAGNOSIS — R52 Pain, unspecified: Secondary | ICD-10-CM

## 2014-04-30 MED ORDER — LORAZEPAM 1 MG PO TABS: 1.0000 mg | ORAL_TABLET | Freq: Two times a day (BID) | ORAL | Status: DC

## 2014-04-30 MED ORDER — VENLAFAXINE HCL ER 150 MG PO CP24: ORAL_CAPSULE | ORAL | Status: DC

## 2014-04-30 MED ORDER — TRAZODONE HCL 50 MG PO TABS: 50.0000 mg | ORAL_TABLET | Freq: Every day | ORAL | Status: DC

## 2014-04-30 NOTE — Progress Notes (Signed)
Patient ID: Lauren [name], female   DOB: 14-Feb-1951, 63 y.o.   MRN: [id number] Patient ID: Lauren [name], female   DOB: 1951-01-01, 63 y.o.   MRN: [id number] Patient ID: Lauren [name], female   DOB: September 21, 1950, 63 y.o.   MRN: [id number] Patient ID: Lauren [name], female   DOB: 1951-03-07, 63 y.o.   MRN: [id number] Patient ID: Lauren [name], female   DOB: 10-Nov-1950, 62 y.o.   MRN: [id number] Patient ID: Lauren [name], female   DOB: [birth year], 63 y.o.   MRN: [id number] Patient ID: Lauren [name], female   DOB: 06-05-1951, 63 y.o.   MRN: [id number] Patient ID: Lauren [name], female   DOB: 1951-02-20, 63 y.o.   MRN: [id number] Lauren [name] [id number] Progress Note Lauren [name] MRN: [id number] DOB: 11/24/1950 Age: 63 y.o.   Date: 04/30/2014  Chief Complaint: Chief Complaint  Patient presents with  . Anxiety  . Depression  . Follow-up   Subjective: "I'm  doing better"  History of presenting illness This patient is a 63 year old married white female who lives with her husband. She has one stepchild. Her step son killed himself 7 years ago at age 110. He was an alcoholic. 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  The patient has a long history of depression. She was last hospitalized in 2010 after she took too many pain pills and got confused. She use to drink heavily but has been sober 23 years and is very active in [employer] and church. For the most part she feels her mood is stable. She sleeping pretty well. Her energy is good. She has a hard time functioning without Ativan but she only usually takes one pill a day side think this is reasonable. She tried a higher dose of Cymbalta but it made her manic and revved up  The patient returns after 6 weeks.. Last time she was acutely suicidal and I referred her to for hospitalization [facility]. She was there from July 13 for the 17th. She's no  longer on Cymbalta but is on an increased dose of Effexor and uses trazodone for sleep. She's feeling better and her mood is improved. She still somewhat depressed and asked if we can increase the Effexor a little bit more from 225 to 300 mg and I think this is reasonable. She has no psychotic symptoms and denies any thoughts of suicide  Allergies: Allergies  Allergen Reactions  . Elavil [Amitriptyline] Other (See Comments)    Vivid dreams and almost suicidal  . Flagyl [Metronidazole] Itching  . Vistaril [Hydroxyzine Hcl] Other (See Comments)    Got higher than a kite on too much of this.  Lindajo Royal [Ziprasidone Hydrochloride]   . Lactose Intolerance (Gi)   Medical History: Past Medical History  Diagnosis Date  . Depression   . Osteoarthritis   . Fibromyalgia   . IBS (irritable bowel syndrome)   . GERD (gastroesophageal reflux disease)   . Prediabetes   . Chronic pain   Surgical History: Past Surgical History  Procedure Laterality Date  . Tonsillectomy    . Cholecystectomy    . Colonoscopy    . Upper gastrointestinal endoscopy    . Mandible surgery  09/06/1979  . Orif wrist fracture Right 07/23/2013    Procedure: OPEN REDUCTION INTERNAL FIXATION (ORIF) WRIST FRACTURE;  Surgeon: Roseanne Kaufman, MD;  Location: [facility];  Service: Orthopedics;  Laterality:  Right;  Family History family history includes Alcohol abuse in her cousin, father, maternal grandfather, maternal grandmother, paternal grandfather, paternal grandmother, and paternal uncle; Anxiety disorder in her maternal uncle and paternal uncle; Depression in her father; Diabetes in her brother, brother, and father; Healthy in her brother; Heart disease in her brother; Irritable bowel syndrome in her mother; OCD in her brother and brother; Sexual abuse in her mother; Stroke in her father and mother. There is no history of ADD / ADHD, Bipolar disorder, Dementia, Drug abuse, Paranoid behavior, Schizophrenia, Seizures, or Physical  abuse. Reviewed al this again to day in the appointment and nothing has changed  Mental status examination Patient is neatly dressed and groomed.  She she states her mood is only mildly depressed and her affect is mood congruent She denies paranoia and she denies any hallucination. She denies suicidal and homicidal ideation and denies auditory and visual summations or paranoia Her attention and concentration is fair. Her thought process is normal  She's alert and oriented x3 and her insight judgment and impulse control are good  Lab Results:  Results for orders placed during the hospital encounter of 03/18/14 (from the past 8736 hour(s))  GLUCOSE, CAPILLARY   Collection Time    03/18/14 12:08 PM      Result Value Ref Range   Glucose-Capillary 114 (*) 70 - 99 mg/dL  GLUCOSE, CAPILLARY   Collection Time    03/18/14  5:09 PM      Result Value Ref Range   Glucose-Capillary 110 (*) 70 - 99 mg/dL   Comment 1 Notify RN    TSH   Collection Time    03/19/14  6:30 AM      Result Value Ref Range   TSH 2.400  0.350 - 4.500 uIU/mL  GLUCOSE, CAPILLARY   Collection Time    03/19/14  6:31 AM      Result Value Ref Range   Glucose-Capillary 140 (*) 70 - 99 mg/dL  GLUCOSE, CAPILLARY   Collection Time    03/19/14  4:54 PM      Result Value Ref Range   Glucose-Capillary 120 (*) 70 - 99 mg/dL   Comment 1 Notify RN    GLUCOSE, CAPILLARY   Collection Time    03/20/14  5:59 AM      Result Value Ref Range   Glucose-Capillary 119 (*) 70 - 99 mg/dL  GLUCOSE, CAPILLARY   Collection Time    03/20/14  4:43 PM      Result Value Ref Range   Glucose-Capillary 121 (*) 70 - 99 mg/dL   Comment 1 Notify RN    GLUCOSE, CAPILLARY   Collection Time    03/21/14  6:06 AM      Result Value Ref Range   Glucose-Capillary 135 (*) 70 - 99 mg/dL  Results for orders placed during the hospital encounter of 03/17/14 (from the past 8736 hour(s))  URINE CULTURE   Collection Time    03/17/14  2:31 PM      Result  Value Ref Range   Specimen Description URINE, CLEAN CATCH     Special Requests NONE     Culture  Setup Time       Value: 03/17/2014 20:40     Performed at [facility]       Value: NO GROWTH     Performed at [facility]   Culture       Value: NO GROWTH     Performed  at [facility]   Report Status 03/18/2014 FINAL    URINALYSIS, ROUTINE W REFLEX MICROSCOPIC   Collection Time    03/17/14  2:31 PM      Result Value Ref Range   Color, Urine YELLOW  YELLOW   APPearance CLEAR  CLEAR   Specific Gravity, Urine <1.005 (*) 1.005 - 1.030   pH 6.5  5.0 - 8.0   Glucose, UA NEGATIVE  NEGATIVE mg/dL   Hgb urine dipstick NEGATIVE  NEGATIVE   Bilirubin Urine NEGATIVE  NEGATIVE   Ketones, ur NEGATIVE  NEGATIVE mg/dL   Protein, ur NEGATIVE  NEGATIVE mg/dL   Urobilinogen, UA 0.2  0.0 - 1.0 mg/dL   Nitrite NEGATIVE  NEGATIVE   Leukocytes, UA NEGATIVE  NEGATIVE  URINE RAPID DRUG SCREEN (HOSP PERFORMED)   Collection Time    03/17/14  2:31 PM      Result Value Ref Range   Opiates NONE DETECTED  NONE DETECTED   Cocaine NONE DETECTED  NONE DETECTED   Benzodiazepines NONE DETECTED  NONE DETECTED   Amphetamines NONE DETECTED  NONE DETECTED   Tetrahydrocannabinol NONE DETECTED  NONE DETECTED   Barbiturates NONE DETECTED  NONE DETECTED  CBC WITH DIFFERENTIAL   Collection Time    03/17/14  2:32 PM      Result Value Ref Range   WBC 6.0  4.0 - 10.5 K/uL   RBC 5.02  3.87 - 5.11 MIL/uL   Hemoglobin 14.1  12.0 - 15.0 g/dL   HCT 42.7  36.0 - 46.0 %   MCV 85.1  78.0 - 100.0 fL   MCH 28.1  26.0 - 34.0 pg   MCHC 33.0  30.0 - 36.0 g/dL   RDW 13.3  11.5 - 15.5 %   Platelets 214  150 - 400 K/uL   Neutrophils Relative % 64  43 - 77 %   Neutro Abs 3.8  1.7 - 7.7 K/uL   Lymphocytes Relative 26  12 - 46 %   Lymphs Abs 1.6  0.7 - 4.0 K/uL   Monocytes Relative 8  3 - 12 %   Monocytes Absolute 0.5  0.1 - 1.0 K/uL   Eosinophils Relative 2  0 - 5 %   Eosinophils  Absolute 0.1  0.0 - 0.7 K/uL   Basophils Relative 0  0 - 1 %   Basophils Absolute 0.0  0.0 - 0.1 K/uL  BASIC METABOLIC PANEL   Collection Time    03/17/14  2:32 PM      Result Value Ref Range   Sodium 138  137 - 147 mEq/L   Potassium 4.4  3.7 - 5.3 mEq/L   Chloride 99  96 - 112 mEq/L   CO2 29  19 - 32 mEq/L   Glucose, Bld 122 (*) 70 - 99 mg/dL   BUN 10  6 - 23 mg/dL   Creatinine, Ser 0.55  0.50 - 1.10 mg/dL   Calcium 10.0  8.4 - 10.5 mg/dL   GFR calc non Af Amer >90  >90 mL/min   GFR calc Af Amer >90  >90 mL/min   Anion gap 10  5 - 15  ETHANOL   Collection Time    03/17/14  2:32 PM      Result Value Ref Range   Alcohol, Ethyl (B) <11  0 - 11 mg/dL  CBG MONITORING, ED   Collection Time    03/18/14  9:07 AM      Result Value Ref Range  Glucose-Capillary 196 (*) 70 - 99 mg/dL  Results for orders placed during the hospital encounter of 07/23/13 (from the past 8736 hour(s))  POCT I-STAT, CHEM 8   Collection Time    07/23/13 10:27 AM      Result Value Ref Range   Sodium 140  135 - 145 mEq/L   Potassium 4.5  3.5 - 5.1 mEq/L   Chloride 101  96 - 112 mEq/L   BUN 20  6 - 23 mg/dL   Creatinine, Ser 0.90  0.50 - 1.10 mg/dL   Glucose, Bld 123 (*) 70 - 99 mg/dL   Calcium, Ion 1.26  1.13 - 1.30 mmol/L   TCO2 29  0 - 100 mmol/L   Hemoglobin 14.6  12.0 - 15.0 g/dL   HCT 43.0  36.0 - 46.0 %  CBC WITH DIFFERENTIAL   Collection Time    07/23/13  2:41 PM      Result Value Ref Range   WBC 9.2  4.0 - 10.5 K/uL   RBC 4.85  3.87 - 5.11 MIL/uL   Hemoglobin 13.0  12.0 - 15.0 g/dL   HCT 40.9  36.0 - 46.0 %   MCV 84.3  78.0 - 100.0 fL   MCH 26.8  26.0 - 34.0 pg   MCHC 31.8  30.0 - 36.0 g/dL   RDW 13.6  11.5 - 15.5 %   Platelets 205  150 - 400 K/uL   Neutrophils Relative % 76  43 - 77 %   Neutro Abs 7.0  1.7 - 7.7 K/uL   Lymphocytes Relative 14  12 - 46 %   Lymphs Abs 1.3  0.7 - 4.0 K/uL   Monocytes Relative 7  3 - 12 %   Monocytes Absolute 0.6  0.1 - 1.0 K/uL   Eosinophils Relative  2  0 - 5 %   Eosinophils Absolute 0.2  0.0 - 0.7 K/uL   Basophils Relative 0  0 - 1 %   Basophils Absolute 0.0  0.0 - 0.1 K/uL  BASIC METABOLIC PANEL   Collection Time    07/23/13  2:41 PM      Result Value Ref Range   Sodium 140  135 - 145 mEq/L   Potassium 4.2  3.5 - 5.1 mEq/L   Chloride 100  96 - 112 mEq/L   CO2 32  19 - 32 mEq/L   Glucose, Bld 100 (*) 70 - 99 mg/dL   BUN 15  6 - 23 mg/dL   Creatinine, Ser 0.64  0.50 - 1.10 mg/dL   Calcium 9.8  8.4 - 10.5 mg/dL   GFR calc non Af Amer >90  >90 mL/min   GFR calc Af Amer >90  >90 mL/min  GLUCOSE, CAPILLARY   Collection Time    07/23/13 10:30 PM      Result Value Ref Range   Glucose-Capillary 112 (*) 70 - 99 mg/dL  GLUCOSE, CAPILLARY   Collection Time    07/24/13  7:28 AM      Result Value Ref Range   Glucose-Capillary 113 (*) 70 - 99 mg/dL  GLUCOSE, CAPILLARY   Collection Time    07/24/13 11:54 AM      Result Value Ref Range   Glucose-Capillary 121 (*) 70 - 99 mg/dL  GLUCOSE, CAPILLARY   Collection Time    07/24/13  2:50 PM      Result Value Ref Range   Glucose-Capillary 152 (*) 70 - 99 mg/dL  GLUCOSE, CAPILLARY   Collection Time  07/24/13  4:23 PM      Result Value Ref Range   Glucose-Capillary 138 (*) 70 - 99 mg/dL   Comment 1 Documented in Chart     Comment 2 Notify RN    COMPREHENSIVE METABOLIC PANEL   Collection Time    07/24/13  4:58 PM      Result Value Ref Range   Sodium 129 (*) 135 - 145 mEq/L   Potassium 4.2  3.5 - 5.1 mEq/L   Chloride 90 (*) 96 - 112 mEq/L   CO2 29  19 - 32 mEq/L   Glucose, Bld 144 (*) 70 - 99 mg/dL   BUN 10  6 - 23 mg/dL   Creatinine, Ser 0.61  0.50 - 1.10 mg/dL   Calcium 8.8  8.4 - 10.5 mg/dL   Total Protein 7.2  6.0 - 8.3 g/dL   Albumin 3.5  3.5 - 5.2 g/dL   AST 37  0 - 37 U/L   ALT 25  0 - 35 U/L   Alkaline Phosphatase 76  39 - 117 U/L   Total Bilirubin 0.6  0.3 - 1.2 mg/dL   GFR calc non Af Amer >90  >90 mL/min   GFR calc Af Amer >90  >90 mL/min  CBC   Collection  Time    07/24/13  4:58 PM      Result Value Ref Range   WBC 8.9  4.0 - 10.5 K/uL   RBC 4.29  3.87 - 5.11 MIL/uL   Hemoglobin 11.6 (*) 12.0 - 15.0 g/dL   HCT 35.8 (*) 36.0 - 46.0 %   MCV 83.4  78.0 - 100.0 fL   MCH 27.0  26.0 - 34.0 pg   MCHC 32.4  30.0 - 36.0 g/dL   RDW 13.7  11.5 - 15.5 %   Platelets 167  150 - 400 K/uL  GLUCOSE, CAPILLARY   Collection Time    07/24/13  9:44 PM      Result Value Ref Range   Glucose-Capillary 131 (*) 70 - 99 mg/dL  CBC   Collection Time    07/25/13  3:43 AM      Result Value Ref Range   WBC 8.4  4.0 - 10.5 K/uL   RBC 4.30  3.87 - 5.11 MIL/uL   Hemoglobin 11.7 (*) 12.0 - 15.0 g/dL   HCT 34.8 (*) 36.0 - 46.0 %   MCV 80.9  78.0 - 100.0 fL   MCH 27.2  26.0 - 34.0 pg   MCHC 33.6  30.0 - 36.0 g/dL   RDW 13.0  11.5 - 15.5 %   Platelets 174  150 - 400 K/uL  COMPREHENSIVE METABOLIC PANEL   Collection Time    07/25/13  3:43 AM      Result Value Ref Range   Sodium 124 (*) 135 - 145 mEq/L   Potassium 3.9  3.5 - 5.1 mEq/L   Chloride 87 (*) 96 - 112 mEq/L   CO2 28  19 - 32 mEq/L   Glucose, Bld 108 (*) 70 - 99 mg/dL   BUN 8  6 - 23 mg/dL   Creatinine, Ser 0.50  0.50 - 1.10 mg/dL   Calcium 8.7  8.4 - 10.5 mg/dL   Total Protein 6.8  6.0 - 8.3 g/dL   Albumin 3.2 (*) 3.5 - 5.2 g/dL   AST 34  0 - 37 U/L   ALT 22  0 - 35 U/L   Alkaline Phosphatase 72  39 - 117 U/L  Total Bilirubin 0.7  0.3 - 1.2 mg/dL   GFR calc non Af Amer >90  >90 mL/min   GFR calc Af Amer >90  >90 mL/min  GLUCOSE, CAPILLARY   Collection Time    07/25/13  6:47 AM      Result Value Ref Range   Glucose-Capillary 119 (*) 70 - 99 mg/dL  GLUCOSE, CAPILLARY   Collection Time    07/25/13 11:17 AM      Result Value Ref Range   Glucose-Capillary 124 (*) 70 - 99 mg/dL   Comment 1 Documented in Chart     Comment 2 Notify RN    GLUCOSE, CAPILLARY   Collection Time    07/25/13 12:28 PM      Result Value Ref Range   Glucose-Capillary 167 (*) 70 - 99 mg/dL  GLUCOSE, CAPILLARY    Collection Time    07/25/13  4:26 PM      Result Value Ref Range   Glucose-Capillary 114 (*) 70 - 99 mg/dL   Comment 1 Documented in Chart     Comment 2 Notify RN    GLUCOSE, CAPILLARY   Collection Time    07/25/13  9:09 PM      Result Value Ref Range   Glucose-Capillary 144 (*) 70 - 99 mg/dL  GLUCOSE, CAPILLARY   Collection Time    07/26/13  7:07 AM      Result Value Ref Range   Glucose-Capillary 131 (*) 70 - 99 mg/dL  COMPREHENSIVE METABOLIC PANEL   Collection Time    07/26/13  9:05 AM      Result Value Ref Range   Sodium 124 (*) 135 - 145 mEq/L   Potassium 3.6  3.5 - 5.1 mEq/L   Chloride 88 (*) 96 - 112 mEq/L   CO2 27  19 - 32 mEq/L   Glucose, Bld 132 (*) 70 - 99 mg/dL   BUN 11  6 - 23 mg/dL   Creatinine, Ser 0.51  0.50 - 1.10 mg/dL   Calcium 8.4  8.4 - 10.5 mg/dL   Total Protein 6.8  6.0 - 8.3 g/dL   Albumin 3.2 (*) 3.5 - 5.2 g/dL   AST 43 (*) 0 - 37 U/L   ALT 27  0 - 35 U/L   Alkaline Phosphatase 71  39 - 117 U/L   Total Bilirubin 0.5  0.3 - 1.2 mg/dL   GFR calc non Af Amer >90  >90 mL/min   GFR calc Af Amer >90  >90 mL/min  GLUCOSE, CAPILLARY   Collection Time    07/26/13 11:56 AM      Result Value Ref Range   Glucose-Capillary 118 (*) 70 - 99 mg/dL  CBC   Collection Time    07/26/13 12:00 PM      Result Value Ref Range   WBC 10.0  4.0 - 10.5 K/uL   RBC 4.29  3.87 - 5.11 MIL/uL   Hemoglobin 11.7 (*) 12.0 - 15.0 g/dL   HCT 34.7 (*) 36.0 - 46.0 %   MCV 80.9  78.0 - 100.0 fL   MCH 27.3  26.0 - 34.0 pg   MCHC 33.7  30.0 - 36.0 g/dL   RDW 13.3  11.5 - 15.5 %   Platelets 178  150 - 400 K/uL  GLUCOSE, CAPILLARY   Collection Time    07/26/13  4:37 PM      Result Value Ref Range   Glucose-Capillary 113 (*) 70 - 99 mg/dL  Family doctor is following  her labs  Assessment Axis I Maj. depressive disorder, insomnia, pain issues, HIstory of alcoholism. Axis II deferred Axis III see medical history Axis IV mild to moderate  Plan: She she'll continue trazodone  and Ativan but increase Effexor XR to 300 mg daily. She'll return in four-weeks  MEDICATIONS this encounter: Meds ordered this encounter  Medications  . traZODone (DESYREL) 50 MG tablet    Sig: Take 1 tablet (50 mg total) by mouth at bedtime.    Dispense:  30 tablet    Refill:  2  . venlafaxine XR (EFFEXOR-XR) 150 MG 24 hr capsule    Sig: Take two in the am    Dispense:  60 capsule    Refill:  2  . LORazepam (ATIVAN) 1 MG tablet    Sig: Take 1 tablet (1 mg total) by mouth every 12 (twelve) hours. For anxiety    Dispense:  60 tablet    Refill:  2    Medical Decision Making Problem Points:  Established problem, worsening (2), Review of last therapy session (1) and Review of psycho-social stressors (1) Data Points:  Review or order clinical lab tests (1) Review of medication regiment & side effects (2) Review of new medications or change in dosage (2)  I certify that outpatient services furnished can reasonably be expected to improve the patient's condition.   Levonne Spiller, MD

## 2014-05-01 NOTE — Telephone Encounter (Signed)
Rx was sent to pharmacy electronically. 

## 2014-05-08 ENCOUNTER — Ambulatory Visit (INDEPENDENT_AMBULATORY_CARE_PROVIDER_SITE_OTHER): Payer: Medicare Other | Admitting: Psychology

## 2014-05-08 DIAGNOSIS — F39 Unspecified mood [affective] disorder: Secondary | ICD-10-CM

## 2014-05-08 DIAGNOSIS — F419 Anxiety disorder, unspecified: Secondary | ICD-10-CM

## 2014-05-08 DIAGNOSIS — F411 Generalized anxiety disorder: Secondary | ICD-10-CM

## 2014-05-26 ENCOUNTER — Telehealth (HOSPITAL_COMMUNITY): Payer: Self-pay | Admitting: *Deleted

## 2014-05-26 NOTE — Telephone Encounter (Signed)
Per pt call, pt states being off her Risperidone is making her Manic. Per pt she is of no danger to herself or others around her. Pt do not know what to do. Pt number [phone]. Pt has an upcoming appointment to see Dr. Harrington Challenger 05-28-14.

## 2014-05-27 ENCOUNTER — Other Ambulatory Visit (HOSPITAL_COMMUNITY): Payer: Self-pay | Admitting: Psychiatry

## 2014-05-27 MED ORDER — RISPERIDONE 0.5 MG PO TABS: 0.5000 mg | ORAL_TABLET | Freq: Two times a day (BID) | ORAL | Status: DC

## 2014-05-27 NOTE — Telephone Encounter (Signed)
Tell her to go back on it-I sent new prescription to her pharmacy. Also move up appointment

## 2014-05-28 ENCOUNTER — Encounter (HOSPITAL_COMMUNITY): Payer: Self-pay | Admitting: Psychiatry

## 2014-05-28 ENCOUNTER — Ambulatory Visit (INDEPENDENT_AMBULATORY_CARE_PROVIDER_SITE_OTHER): Payer: Medicare Other | Admitting: Psychiatry

## 2014-05-28 VITALS — BP 123/77 | HR 92 | Ht 66.0 in | Wt 139.6 lb

## 2014-05-28 DIAGNOSIS — G4700 Insomnia, unspecified: Secondary | ICD-10-CM

## 2014-05-28 DIAGNOSIS — F39 Unspecified mood [affective] disorder: Secondary | ICD-10-CM

## 2014-05-28 DIAGNOSIS — F329 Major depressive disorder, single episode, unspecified: Secondary | ICD-10-CM

## 2014-05-28 NOTE — Progress Notes (Signed)
Patient ID: Lauren [name], female   DOB: 12-Sep-1950, 63 y.o.   MRN: [id number] Patient ID: NEKEISHA [name], female   DOB: Apr 13, 1951, 63 y.o.   MRN: [id number] Patient ID: LYNETT [name], female   DOB: Mar 08, 1951, 63 y.o.   MRN: [id number] Patient ID: Lauren [name], female   DOB: [birth year], 63 y.o.   MRN: [id number] Patient ID: [name], female   DOB: 25-Jul-1951, 63 y.o.   MRN: [id number] Patient ID: [name], female   DOB: 06/12/1951, 63 y.o.   MRN: [id number] Patient ID: [name], female   DOB: [birth year], 63 y.o.   MRN: [id number] Patient ID: Lauren [name], female   DOB: 1951/01/29, 63 y.o.   MRN: [id number] Patient ID: [name], female   DOB: [birth year], 63 y.o.   MRN: [id number] [place] [address] Progress Note [name] MRN: [id number] DOB: 1950/09/17 Age: 63 y.o.  Date: 05/28/2014  Chief Complaint: Chief Complaint  Patient presents with  . Anxiety  . Depression  . Manic Behavior  . Follow-up   Subjective: "I'm  doing better"  History of presenting illness This patient is a 63 year old married white female who lives with her husband. She has one stepchild. Her step son killed himself 7 years ago at age 73. He was an alcoholic. 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  The patient has a long history of depression. She was last hospitalized in 2010 after she took too many pain pills and got confused. She use to drink heavily but has been sober 23 years and is very active in [employer] and church. For the most part she feels her mood is stable. She sleeping pretty well. Her energy is good. She has a hard time functioning without Ativan but she only usually takes one pill a day side think this is reasonable. She tried a higher dose of Cymbalta but it made her manic and revved up  The patient returns as a work in today. She was seen about 3 weeks ago. She called 2 days ago and  claimed she was getting "manic" without the Risperdal. I did call the medicine in for her but she has not yet picked it up. She states that today she called she had been spending time with a friend and get very aggressive in controlling. This made the patient feel anxious and agitated inside. For the last couple of [facility] and she feels much better. She still is going to pick up the Risperdal but will only use it if needed because it makes her feel very groggy. She denies being suicidal and she is sleeping well at night  Allergies: Allergies  Allergen Reactions  . Elavil [Amitriptyline] Other (See Comments)    Vivid dreams and almost suicidal  . Flagyl [Metronidazole] Itching  . Vistaril [Hydroxyzine Hcl] Other (See Comments)    Got higher than a kite on too much of this.  Lindajo Royal [Ziprasidone Hydrochloride]   . Lactose Intolerance (Gi)   Medical History: Past Medical History  Diagnosis Date  . Depression   . Osteoarthritis   . Fibromyalgia   . IBS (irritable bowel syndrome)   . GERD (gastroesophageal reflux disease)   . Prediabetes   . Chronic pain   Surgical History: Past Surgical History  Procedure Laterality Date  . Tonsillectomy    . Cholecystectomy    .  Colonoscopy    . Upper gastrointestinal endoscopy    . Mandible surgery  09/06/1979  . Orif wrist fracture Right 07/23/2013    Procedure: OPEN REDUCTION INTERNAL FIXATION (ORIF) WRIST FRACTURE;  Surgeon: Roseanne Kaufman, MD;  Location: [facility];  Service: Orthopedics;  Laterality: Right;  Family History family history includes Alcohol abuse in her cousin, father, maternal grandfather, maternal grandmother, paternal grandfather, paternal grandmother, and paternal uncle; Anxiety disorder in her maternal uncle and paternal uncle; Depression in her father; Diabetes in her brother, brother, and father; Healthy in her brother; Heart disease in her brother; Irritable bowel syndrome in her mother; OCD in her  brother and brother; Sexual abuse in her mother; Stroke in her father and mother. There is no history of ADD / ADHD, Bipolar disorder, Dementia, Drug abuse, Paranoid behavior, Schizophrenia, Seizures, or Physical abuse. Reviewed al this again to day in the appointment and nothing has changed  Mental status examination Patient is neatly dressed and groomed.  She she states her mood is good and her affect is mood congruent. She is a little anxious She denies paranoia and she denies any hallucination. She denies suicidal and homicidal ideation and denies auditory and visual summations or paranoia Her attention and concentration is fair. Her thought process is normal  She's alert and oriented x3 and her insight judgment and impulse control are good  Lab Results:  Results for orders placed during the hospital encounter of 03/18/14 (from the past 8736 hour(s))  GLUCOSE, CAPILLARY   Collection Time    03/18/14 12:08 PM      Result Value Ref Range   Glucose-Capillary 114 (*) 70 - 99 mg/dL  GLUCOSE, CAPILLARY   Collection Time    03/18/14  5:09 PM      Result Value Ref Range   Glucose-Capillary 110 (*) 70 - 99 mg/dL   Comment 1 Notify RN    TSH   Collection Time    03/19/14  6:30 AM      Result Value Ref Range   TSH 2.400  0.350 - 4.500 uIU/mL  GLUCOSE, CAPILLARY   Collection Time    03/19/14  6:31 AM      Result Value Ref Range   Glucose-Capillary 140 (*) 70 - 99 mg/dL  GLUCOSE, CAPILLARY   Collection Time    03/19/14  4:54 PM      Result Value Ref Range   Glucose-Capillary 120 (*) 70 - 99 mg/dL   Comment 1 Notify RN    GLUCOSE, CAPILLARY   Collection Time    03/20/14  5:59 AM      Result Value Ref Range   Glucose-Capillary 119 (*) 70 - 99 mg/dL  GLUCOSE, CAPILLARY   Collection Time    03/20/14  4:43 PM      Result Value Ref Range   Glucose-Capillary 121 (*) 70 - 99 mg/dL   Comment 1 Notify RN    GLUCOSE, CAPILLARY   Collection Time    03/21/14  6:06 AM      Result Value Ref  Range   Glucose-Capillary 135 (*) 70 - 99 mg/dL  Results for orders placed during the hospital encounter of 03/17/14 (from the past 8736 hour(s))  URINE CULTURE   Collection Time    03/17/14  2:31 PM      Result Value Ref Range   Specimen Description URINE, CLEAN CATCH     Special Requests NONE     Culture  Setup Time  Value: 03/17/2014 20:40     Performed at [facility]       Value: NO GROWTH     Performed at [facility]   Culture       Value: NO GROWTH     Performed at [facility]   Report Status 03/18/2014 FINAL    URINALYSIS, ROUTINE W REFLEX MICROSCOPIC   Collection Time    03/17/14  2:31 PM      Result Value Ref Range   Color, Urine YELLOW  YELLOW   APPearance CLEAR  CLEAR   Specific Gravity, Urine <1.005 (*) 1.005 - 1.030   pH 6.5  5.0 - 8.0   Glucose, UA NEGATIVE  NEGATIVE mg/dL   Hgb urine dipstick NEGATIVE  NEGATIVE   Bilirubin Urine NEGATIVE  NEGATIVE   Ketones, ur NEGATIVE  NEGATIVE mg/dL   Protein, ur NEGATIVE  NEGATIVE mg/dL   Urobilinogen, UA 0.2  0.0 - 1.0 mg/dL   Nitrite NEGATIVE  NEGATIVE   Leukocytes, UA NEGATIVE  NEGATIVE  URINE RAPID DRUG SCREEN (HOSP PERFORMED)   Collection Time    03/17/14  2:31 PM      Result Value Ref Range   Opiates NONE DETECTED  NONE DETECTED   Cocaine NONE DETECTED  NONE DETECTED   Benzodiazepines NONE DETECTED  NONE DETECTED   Amphetamines NONE DETECTED  NONE DETECTED   Tetrahydrocannabinol NONE DETECTED  NONE DETECTED   Barbiturates NONE DETECTED  NONE DETECTED  CBC WITH DIFFERENTIAL   Collection Time    03/17/14  2:32 PM      Result Value Ref Range   WBC 6.0  4.0 - 10.5 K/uL   RBC 5.02  3.87 - 5.11 MIL/uL   Hemoglobin 14.1  12.0 - 15.0 g/dL   HCT 42.7  36.0 - 46.0 %   MCV 85.1  78.0 - 100.0 fL   MCH 28.1  26.0 - 34.0 pg   MCHC 33.0  30.0 - 36.0 g/dL   RDW 13.3  11.5 - 15.5 %   Platelets 214  150 - 400 K/uL   Neutrophils Relative % 64  43 - 77 %   Neutro Abs 3.8   1.7 - 7.7 K/uL   Lymphocytes Relative 26  12 - 46 %   Lymphs Abs 1.6  0.7 - 4.0 K/uL   Monocytes Relative 8  3 - 12 %   Monocytes Absolute 0.5  0.1 - 1.0 K/uL   Eosinophils Relative 2  0 - 5 %   Eosinophils Absolute 0.1  0.0 - 0.7 K/uL   Basophils Relative 0  0 - 1 %   Basophils Absolute 0.0  0.0 - 0.1 K/uL  BASIC METABOLIC PANEL   Collection Time    03/17/14  2:32 PM      Result Value Ref Range   Sodium 138  137 - 147 mEq/L   Potassium 4.4  3.7 - 5.3 mEq/L   Chloride 99  96 - 112 mEq/L   CO2 29  19 - 32 mEq/L   Glucose, Bld 122 (*) 70 - 99 mg/dL   BUN 10  6 - 23 mg/dL   Creatinine, Ser 0.55  0.50 - 1.10 mg/dL   Calcium 10.0  8.4 - 10.5 mg/dL   GFR calc non Af Amer >90  >90 mL/min   GFR calc Af Amer >90  >90 mL/min   Anion gap 10  5 - 15  ETHANOL   Collection Time  03/17/14  2:32 PM      Result Value Ref Range   Alcohol, Ethyl (B) <11  0 - 11 mg/dL  CBG MONITORING, ED   Collection Time    03/18/14  9:07 AM      Result Value Ref Range   Glucose-Capillary 196 (*) 70 - 99 mg/dL  Results for orders placed during the hospital encounter of 07/23/13 (from the past 8736 hour(s))  POCT I-STAT, CHEM 8   Collection Time    07/23/13 10:27 AM      Result Value Ref Range   Sodium 140  135 - 145 mEq/L   Potassium 4.5  3.5 - 5.1 mEq/L   Chloride 101  96 - 112 mEq/L   BUN 20  6 - 23 mg/dL   Creatinine, Ser 0.90  0.50 - 1.10 mg/dL   Glucose, Bld 123 (*) 70 - 99 mg/dL   Calcium, Ion 1.26  1.13 - 1.30 mmol/L   TCO2 29  0 - 100 mmol/L   Hemoglobin 14.6  12.0 - 15.0 g/dL   HCT 43.0  36.0 - 46.0 %  CBC WITH DIFFERENTIAL   Collection Time    07/23/13  2:41 PM      Result Value Ref Range   WBC 9.2  4.0 - 10.5 K/uL   RBC 4.85  3.87 - 5.11 MIL/uL   Hemoglobin 13.0  12.0 - 15.0 g/dL   HCT 40.9  36.0 - 46.0 %   MCV 84.3  78.0 - 100.0 fL   MCH 26.8  26.0 - 34.0 pg   MCHC 31.8  30.0 - 36.0 g/dL   RDW 13.6  11.5 - 15.5 %   Platelets 205  150 - 400 K/uL   Neutrophils Relative % 76  43  - 77 %   Neutro Abs 7.0  1.7 - 7.7 K/uL   Lymphocytes Relative 14  12 - 46 %   Lymphs Abs 1.3  0.7 - 4.0 K/uL   Monocytes Relative 7  3 - 12 %   Monocytes Absolute 0.6  0.1 - 1.0 K/uL   Eosinophils Relative 2  0 - 5 %   Eosinophils Absolute 0.2  0.0 - 0.7 K/uL   Basophils Relative 0  0 - 1 %   Basophils Absolute 0.0  0.0 - 0.1 K/uL  BASIC METABOLIC PANEL   Collection Time    07/23/13  2:41 PM      Result Value Ref Range   Sodium 140  135 - 145 mEq/L   Potassium 4.2  3.5 - 5.1 mEq/L   Chloride 100  96 - 112 mEq/L   CO2 32  19 - 32 mEq/L   Glucose, Bld 100 (*) 70 - 99 mg/dL   BUN 15  6 - 23 mg/dL   Creatinine, Ser 0.64  0.50 - 1.10 mg/dL   Calcium 9.8  8.4 - 10.5 mg/dL   GFR calc non Af Amer >90  >90 mL/min   GFR calc Af Amer >90  >90 mL/min  GLUCOSE, CAPILLARY   Collection Time    07/23/13 10:30 PM      Result Value Ref Range   Glucose-Capillary 112 (*) 70 - 99 mg/dL  GLUCOSE, CAPILLARY   Collection Time    07/24/13  7:28 AM      Result Value Ref Range   Glucose-Capillary 113 (*) 70 - 99 mg/dL  GLUCOSE, CAPILLARY   Collection Time    07/24/13 11:54 AM      Result Value Ref  Range   Glucose-Capillary 121 (*) 70 - 99 mg/dL  GLUCOSE, CAPILLARY   Collection Time    07/24/13  2:50 PM      Result Value Ref Range   Glucose-Capillary 152 (*) 70 - 99 mg/dL  GLUCOSE, CAPILLARY   Collection Time    07/24/13  4:23 PM      Result Value Ref Range   Glucose-Capillary 138 (*) 70 - 99 mg/dL   Comment 1 Documented in Chart     Comment 2 Notify RN    COMPREHENSIVE METABOLIC PANEL   Collection Time    07/24/13  4:58 PM      Result Value Ref Range   Sodium 129 (*) 135 - 145 mEq/L   Potassium 4.2  3.5 - 5.1 mEq/L   Chloride 90 (*) 96 - 112 mEq/L   CO2 29  19 - 32 mEq/L   Glucose, Bld 144 (*) 70 - 99 mg/dL   BUN 10  6 - 23 mg/dL   Creatinine, Ser 0.61  0.50 - 1.10 mg/dL   Calcium 8.8  8.4 - 10.5 mg/dL   Total Protein 7.2  6.0 - 8.3 g/dL   Albumin 3.5  3.5 - 5.2 g/dL   AST 37   0 - 37 U/L   ALT 25  0 - 35 U/L   Alkaline Phosphatase 76  39 - 117 U/L   Total Bilirubin 0.6  0.3 - 1.2 mg/dL   GFR calc non Af Amer >90  >90 mL/min   GFR calc Af Amer >90  >90 mL/min  CBC   Collection Time    07/24/13  4:58 PM      Result Value Ref Range   WBC 8.9  4.0 - 10.5 K/uL   RBC 4.29  3.87 - 5.11 MIL/uL   Hemoglobin 11.6 (*) 12.0 - 15.0 g/dL   HCT 35.8 (*) 36.0 - 46.0 %   MCV 83.4  78.0 - 100.0 fL   MCH 27.0  26.0 - 34.0 pg   MCHC 32.4  30.0 - 36.0 g/dL   RDW 13.7  11.5 - 15.5 %   Platelets 167  150 - 400 K/uL  GLUCOSE, CAPILLARY   Collection Time    07/24/13  9:44 PM      Result Value Ref Range   Glucose-Capillary 131 (*) 70 - 99 mg/dL  CBC   Collection Time    07/25/13  3:43 AM      Result Value Ref Range   WBC 8.4  4.0 - 10.5 K/uL   RBC 4.30  3.87 - 5.11 MIL/uL   Hemoglobin 11.7 (*) 12.0 - 15.0 g/dL   HCT 34.8 (*) 36.0 - 46.0 %   MCV 80.9  78.0 - 100.0 fL   MCH 27.2  26.0 - 34.0 pg   MCHC 33.6  30.0 - 36.0 g/dL   RDW 13.0  11.5 - 15.5 %   Platelets 174  150 - 400 K/uL  COMPREHENSIVE METABOLIC PANEL   Collection Time    07/25/13  3:43 AM      Result Value Ref Range   Sodium 124 (*) 135 - 145 mEq/L   Potassium 3.9  3.5 - 5.1 mEq/L   Chloride 87 (*) 96 - 112 mEq/L   CO2 28  19 - 32 mEq/L   Glucose, Bld 108 (*) 70 - 99 mg/dL   BUN 8  6 - 23 mg/dL   Creatinine, Ser 0.50  0.50 - 1.10 mg/dL   Calcium 8.7  8.4 - 10.5 mg/dL   Total Protein 6.8  6.0 - 8.3 g/dL   Albumin 3.2 (*) 3.5 - 5.2 g/dL   AST 34  0 - 37 U/L   ALT 22  0 - 35 U/L   Alkaline Phosphatase 72  39 - 117 U/L   Total Bilirubin 0.7  0.3 - 1.2 mg/dL   GFR calc non Af Amer >90  >90 mL/min   GFR calc Af Amer >90  >90 mL/min  GLUCOSE, CAPILLARY   Collection Time    07/25/13  6:47 AM      Result Value Ref Range   Glucose-Capillary 119 (*) 70 - 99 mg/dL  GLUCOSE, CAPILLARY   Collection Time    07/25/13 11:17 AM      Result Value Ref Range   Glucose-Capillary 124 (*) 70 - 99 mg/dL   Comment  1 Documented in Chart     Comment 2 Notify RN    GLUCOSE, CAPILLARY   Collection Time    07/25/13 12:28 PM      Result Value Ref Range   Glucose-Capillary 167 (*) 70 - 99 mg/dL  GLUCOSE, CAPILLARY   Collection Time    07/25/13  4:26 PM      Result Value Ref Range   Glucose-Capillary 114 (*) 70 - 99 mg/dL   Comment 1 Documented in Chart     Comment 2 Notify RN    GLUCOSE, CAPILLARY   Collection Time    07/25/13  9:09 PM      Result Value Ref Range   Glucose-Capillary 144 (*) 70 - 99 mg/dL  GLUCOSE, CAPILLARY   Collection Time    07/26/13  7:07 AM      Result Value Ref Range   Glucose-Capillary 131 (*) 70 - 99 mg/dL  COMPREHENSIVE METABOLIC PANEL   Collection Time    07/26/13  9:05 AM      Result Value Ref Range   Sodium 124 (*) 135 - 145 mEq/L   Potassium 3.6  3.5 - 5.1 mEq/L   Chloride 88 (*) 96 - 112 mEq/L   CO2 27  19 - 32 mEq/L   Glucose, Bld 132 (*) 70 - 99 mg/dL   BUN 11  6 - 23 mg/dL   Creatinine, Ser 0.51  0.50 - 1.10 mg/dL   Calcium 8.4  8.4 - 10.5 mg/dL   Total Protein 6.8  6.0 - 8.3 g/dL   Albumin 3.2 (*) 3.5 - 5.2 g/dL   AST 43 (*) 0 - 37 U/L   ALT 27  0 - 35 U/L   Alkaline Phosphatase 71  39 - 117 U/L   Total Bilirubin 0.5  0.3 - 1.2 mg/dL   GFR calc non Af Amer >90  >90 mL/min   GFR calc Af Amer >90  >90 mL/min  GLUCOSE, CAPILLARY   Collection Time    07/26/13 11:56 AM      Result Value Ref Range   Glucose-Capillary 118 (*) 70 - 99 mg/dL  CBC   Collection Time    07/26/13 12:00 PM      Result Value Ref Range   WBC 10.0  4.0 - 10.5 K/uL   RBC 4.29  3.87 - 5.11 MIL/uL   Hemoglobin 11.7 (*) 12.0 - 15.0 g/dL   HCT 34.7 (*) 36.0 - 46.0 %   MCV 80.9  78.0 - 100.0 fL   MCH 27.3  26.0 - 34.0 pg   MCHC 33.7  30.0 - 36.0 g/dL  RDW 13.3  11.5 - 15.5 %   Platelets 178  150 - 400 K/uL  GLUCOSE, CAPILLARY   Collection Time    07/26/13  4:37 PM      Result Value Ref Range   Glucose-Capillary 113 (*) 70 - 99 mg/dL  Family doctor is following her  labs  Assessment Axis I Maj. depressive disorder, insomnia, pain issues, HIstory of alcoholism. Axis II deferred Axis III see medical history Axis IV mild to moderate  Plan: She she'll continue trazodone and Ativan  and Effexor XR to 300 mg daily. She will use Risperdal 0.5 mg qhs as needed. She'll return in four-weeks  MEDICATIONS this encounter: Meds ordered this encounter  Medications  . risperiDONE (RISPERDAL) 0.5 MG tablet    Sig: Take 0.5 mg by mouth at bedtime.    Medical Decision Making Problem Points:  Established problem, worsening (2), Review of last therapy session (1) and Review of psycho-social stressors (1) Data Points:  Review or order clinical lab tests (1) Review of medication regiment & side effects (2) Review of new medications or change in dosage (2)  I certify that outpatient services furnished can reasonably be expected to improve the patient's condition.   Levonne Spiller, MD

## 2014-05-29 NOTE — Telephone Encounter (Signed)
Pt is aware of Dr. Harrington Challenger decision and shows understanding

## 2014-06-03 ENCOUNTER — Ambulatory Visit (INDEPENDENT_AMBULATORY_CARE_PROVIDER_SITE_OTHER): Payer: 59 | Admitting: Psychology

## 2014-06-03 DIAGNOSIS — F39 Unspecified mood [affective] disorder: Secondary | ICD-10-CM

## 2014-06-03 DIAGNOSIS — F411 Generalized anxiety disorder: Secondary | ICD-10-CM

## 2014-06-03 DIAGNOSIS — F419 Anxiety disorder, unspecified: Secondary | ICD-10-CM

## 2014-06-09 ENCOUNTER — Ambulatory Visit (INDEPENDENT_AMBULATORY_CARE_PROVIDER_SITE_OTHER): Payer: Medicare Other | Admitting: Psychology

## 2014-06-09 DIAGNOSIS — F419 Anxiety disorder, unspecified: Secondary | ICD-10-CM

## 2014-06-09 DIAGNOSIS — F39 Unspecified mood [affective] disorder: Secondary | ICD-10-CM

## 2014-06-10 ENCOUNTER — Encounter (HOSPITAL_COMMUNITY): Payer: Self-pay | Admitting: Psychology

## 2014-06-10 NOTE — Progress Notes (Signed)
    PROGRESS NOTE   Patient:  Lauren [name]   DOB: 1951-02-28  MR Number: [id number]  Location: [facility] [address] [place] [address] Dept: [phone]  Start: 2 PM End: 3 PM  Provider/Observer:     Edgardo Roys PSYD  Chief Complaint:      Chief Complaint  Patient presents with  . Anxiety  . Depression    Reason For Service:     The patient has a long history of major depressive condition and ongoing underlying depressive disorder. She is a recovering alcoholic but has many many years this Friday. Significant symptoms of anxiety have played a major role in substance abuse in the past. Significant medical problems and her inability to work have complicated her situation. When all these difficulties are put in the context and relationship to potential workability it is clear that she is disabled not do not expect her to return to gainful employment.  Interventions Strategy:  Cognitive/behavioral psychotherapy  Participation Level:   Active  Participation Quality:  Appropriate      Behavioral Observation:  Well Groomed, Alert, and Appropriate.   Current Psychosocial Factors: The patient reports that she has done very poorly over the past 2 days. She reports that her close friend's husband committed suicide and he brought up feelings about her stepson suicide and essentially overwhelm her. Another friend has been in the hospital and intensive care and she has not been able to spend much time there and feels very guilty about this .    Content of Session:   Review current symptoms and continue to work on therapeutic interventions for improved coping skills and strategies.  Current Status:   The patient reports that she has had an acute episode of decompensation recently with the death of her friend's husband from suicide and other psychosocial stressors.  Patient  Progress:   Stable and improving  Target Goals:   Reduce the frequency of depressive events including feelings of hopelessness and helplessness, anhedonia, social withdrawal, as well as significant anxiety symptoms related to social avoidance, fear of abandonment and fear of inability to manage and cope with her world. We will also look at continuing complete sobriety.  Last Reviewed:   06/09/2014   Goals Addressed Today:    Target goals addressed today have to do with issues of depression in particular feelings of helplessness and hopelessness.   Impression/Diagnosis:   The patient has a long history of depressive disorder and underlying anxiety disorder. She has had numerous medical issues including severe fibromyalgia and severe arthritis. She has been diagnosed with significant arthritis throughout her body and in particular her hands and her feet. Numerous events and left are overwhelmed and unable to work. The patient has had periodic times of improvement for primary symptoms but there are other times where the symptoms are so problematic that she has not been able to maintain gainful employment.  Diagnosis:    Axis I:  Mood disorder  Anxiety    RODENBOUGH,JOHN R, PsyD 06/10/2014

## 2014-06-19 ENCOUNTER — Encounter (HOSPITAL_COMMUNITY): Payer: Self-pay | Admitting: Psychology

## 2014-06-19 NOTE — Progress Notes (Signed)
    PROGRESS NOTE   Patient:  Lauren [name]   DOB: March 21, 1951  MR Number: [id number]  Location: [facility] [address] [place] [address] Dept: [phone]  Start: 1 PM End: 2 PM  Provider/Observer:     Edgardo Roys PSYD  Chief Complaint:      Chief Complaint  Patient presents with  . Agitation  . Depression  . Anxiety    Reason For Service:     The patient has a long history of major depressive condition and ongoing underlying depressive disorder. She is a recovering alcoholic but has many many years this Friday. Significant symptoms of anxiety have played a major role in substance abuse in the past. Significant medical problems and her inability to work have complicated her situation. When all these difficulties are put in the context and relationship to potential workability it is clear that she is disabled not do not expect her to return to gainful employment.  Interventions Strategy:  Cognitive/behavioral psychotherapy  Participation Level:   Active  Participation Quality:  Appropriate      Behavioral Observation:  Well Groomed, Alert, and Appropriate.   Current Psychosocial Factors: The patient reports that she has been doing better overall but is beginning to feel more anxiety than she was her last visit. We worked on Radiographer, therapeutic and strategies. She continues to ruminate about guilty feelings about things that she really had little control over her life..    Content of Session:   Review current symptoms and continue to work on therapeutic interventions for improved coping skills and strategies.  Current Status:   The patient reports that she has been experiencing improved mood and not as much anxieity, guilt and depression.  Patient Progress:   Stable and improving  Target Goals:   Reduce the frequency of depressive events including feelings of hopelessness and  helplessness, anhedonia, social withdrawal, as well as significant anxiety symptoms related to social avoidance, fear of abandonment and fear of inability to manage and cope with her world. We will also look at continuing complete sobriety.  Last Reviewed:   05/08/2014   Goals Addressed Today:    Target goals addressed today have to do with issues of depression in particular feelings of helplessness and hopelessness.   Impression/Diagnosis:   The patient has a long history of depressive disorder and underlying anxiety disorder. She has had numerous medical issues including severe fibromyalgia and severe arthritis. She has been diagnosed with significant arthritis throughout her body and in particular her hands and her feet. Numerous events and left are overwhelmed and unable to work. The patient has had periodic times of improvement for primary symptoms but there are other times where the symptoms are so problematic that she has not been able to maintain gainful employment.  Diagnosis:    Axis I:  Mood disorder  Anxiety    RODENBOUGH,JOHN R, PsyD 06/19/2014

## 2014-06-25 ENCOUNTER — Encounter (HOSPITAL_COMMUNITY): Payer: Self-pay | Admitting: Psychiatry

## 2014-06-25 ENCOUNTER — Ambulatory Visit (INDEPENDENT_AMBULATORY_CARE_PROVIDER_SITE_OTHER): Payer: 59 | Admitting: Psychiatry

## 2014-06-25 VITALS — BP 97/70 | HR 107 | Ht 66.0 in | Wt 140.2 lb

## 2014-06-25 DIAGNOSIS — F39 Unspecified mood [affective] disorder: Secondary | ICD-10-CM

## 2014-06-25 DIAGNOSIS — F329 Major depressive disorder, single episode, unspecified: Secondary | ICD-10-CM

## 2014-06-25 DIAGNOSIS — F1021 Alcohol dependence, in remission: Secondary | ICD-10-CM

## 2014-06-25 DIAGNOSIS — G4700 Insomnia, unspecified: Secondary | ICD-10-CM

## 2014-06-25 MED ORDER — LORAZEPAM 1 MG PO TABS: 1.0000 mg | ORAL_TABLET | Freq: Three times a day (TID) | ORAL | Status: DC

## 2014-06-25 MED ORDER — TRAZODONE HCL 50 MG PO TABS: 50.0000 mg | ORAL_TABLET | Freq: Every day | ORAL | Status: DC

## 2014-06-25 MED ORDER — VENLAFAXINE HCL ER 150 MG PO CP24: 150.0000 mg | ORAL_CAPSULE | Freq: Every day | ORAL | Status: DC

## 2014-06-25 NOTE — Progress Notes (Signed)
Patient ID: Lauren [name], female   DOB: 27-Dec-1950, 63 y.o.   MRN: [id number] Patient ID: Lauren [name], female   DOB: May 27, 1951, 63 y.o.   MRN: [id number] Patient ID: Lauren [name], female   DOB: July 03, 1951, 63 y.o.   MRN: [id number] Patient ID: Lauren [name], female   DOB: July 26, 1951, 63 y.o.   MRN: [id number] Patient ID: Lauren [name], female   DOB: October 24, 1950, 63 y.o.   MRN: [id number] Patient ID: Lauren [name], female   DOB: [birth year], 63 y.o.   MRN: [id number] Patient ID: Lauren [name], female   DOB: 1951/02/13, 63 y.o.   MRN: [id number] Patient ID: Lauren [name], female   DOB: [birth year], 63 y.o.   MRN: [id number] Patient ID: Lauren [name], female   DOB: 1950/10/11, 63 y.o.   MRN: [id number] Patient ID: Lauren [name], female   DOB: [birth year], 63 y.o.   MRN: [id number] [place] [address] Progress Note Lauren [name] MRN: [id number] DOB: [birth year] Age: 63 y.o.  Date: 06/25/2014  Chief Complaint: Chief Complaint  Patient presents with  . Anxiety  . Depression  . Manic Behavior  . Follow-up   Subjective: I've been stressed  History of presenting illness This patient is a 63 year old married white female who lives with her husband. She has one stepchild. Her step son killed himself 7 years ago at age 29. He was an alcoholic. 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  The patient has a long history of depression. She was last hospitalized in 2010 after she took too many pain pills and got confused. She use to drink heavily but has been sober 23 years and is very active in [employer] and church. For the most part she feels her mood is stable. She sleeping pretty well. Her energy is good. She has a hard time functioning without Ativan but she only usually takes one pill a day side think this is reasonable. She tried a higher dose of Cymbalta but it made her manic and revved up  The patient returns  after four-week's. She tells me she cannot take Effexor up to 300 mg a day. It makes her too agitated and she's unable to sleep. She does fairly well on just the 150 mg per day. She sleeping well with the trazodone and Ativan helps her anxiety. For some reason she's been questioning her past behavior quite a bit and has "a lot of regrets." She particularly feels guilty about not spending more time with her husband's children. Explained to her that this can not be changed and she can only move forward from today. She denies suicidal ideation  Allergies: Allergies  Allergen Reactions  . Elavil [Amitriptyline] Other (See Comments)    Vivid dreams and almost suicidal  . Flagyl [Metronidazole] Itching  . Vistaril [Hydroxyzine Hcl] Other (See Comments)    Got higher than a kite on too much of this.  Lindajo Royal [Ziprasidone Hydrochloride]   . Lactose Intolerance (Gi)   Medical History: Past Medical History  Diagnosis Date  . Depression   . Osteoarthritis   . Fibromyalgia   . IBS (irritable bowel syndrome)   . GERD (gastroesophageal reflux disease)   . Prediabetes   . Chronic pain   Surgical History: Past Surgical History  Procedure Laterality Date  . Tonsillectomy    . Cholecystectomy    . Colonoscopy    . Upper gastrointestinal endoscopy    .  Mandible surgery  09/06/1979  . Orif wrist fracture Right 07/23/2013    Procedure: OPEN REDUCTION INTERNAL FIXATION (ORIF) WRIST FRACTURE;  Surgeon: Roseanne Kaufman, MD;  Location: [facility];  Service: Orthopedics;  Laterality: Right;  Family History family history includes Alcohol abuse in her cousin, father, maternal grandfather, maternal grandmother, paternal grandfather, paternal grandmother, and paternal uncle; Anxiety disorder in her maternal uncle and paternal uncle; Depression in her father; Diabetes in her brother, brother, and father; Healthy in her brother; Heart disease in her brother; Irritable bowel syndrome in her mother; OCD in her brother and  brother; Sexual abuse in her mother; Stroke in her father and mother. There is no history of ADD / ADHD, Bipolar disorder, Dementia, Drug abuse, Paranoid behavior, Schizophrenia, Seizures, or Physical abuse. Reviewed al this again to day in the appointment and nothing has changed  Mental status examination Patient is neatly dressed and groomed.  She she states her mood is anxious and her affect is mood congruent.  She denies paranoia and she denies any hallucination. She denies suicidal and homicidal ideation and denies auditory and visual summations or paranoia Her attention and concentration is fair. Her thought process is normal  She's alert and oriented x3 and her insight judgment and impulse control are good  Lab Results:  Results for orders placed during the hospital encounter of 03/18/14 (from the past 8736 hour(s))  GLUCOSE, CAPILLARY   Collection Time    03/18/14 12:08 PM      Result Value Ref Range   Glucose-Capillary 114 (*) 70 - 99 mg/dL  GLUCOSE, CAPILLARY   Collection Time    03/18/14  5:09 PM      Result Value Ref Range   Glucose-Capillary 110 (*) 70 - 99 mg/dL   Comment 1 Notify RN    TSH   Collection Time    03/19/14  6:30 AM      Result Value Ref Range   TSH 2.400  0.350 - 4.500 uIU/mL  GLUCOSE, CAPILLARY   Collection Time    03/19/14  6:31 AM      Result Value Ref Range   Glucose-Capillary 140 (*) 70 - 99 mg/dL  GLUCOSE, CAPILLARY   Collection Time    03/19/14  4:54 PM      Result Value Ref Range   Glucose-Capillary 120 (*) 70 - 99 mg/dL   Comment 1 Notify RN    GLUCOSE, CAPILLARY   Collection Time    03/20/14  5:59 AM      Result Value Ref Range   Glucose-Capillary 119 (*) 70 - 99 mg/dL  GLUCOSE, CAPILLARY   Collection Time    03/20/14  4:43 PM      Result Value Ref Range   Glucose-Capillary 121 (*) 70 - 99 mg/dL   Comment 1 Notify RN    GLUCOSE, CAPILLARY   Collection Time    03/21/14  6:06 AM      Result Value Ref Range   Glucose-Capillary 135  (*) 70 - 99 mg/dL  Results for orders placed during the hospital encounter of 03/17/14 (from the past 8736 hour(s))  URINE CULTURE   Collection Time    03/17/14  2:31 PM      Result Value Ref Range   Specimen Description URINE, CLEAN CATCH     Special Requests NONE     Culture  Setup Time       Value: 03/17/2014 20:40     Performed at [facility]  Value: NO GROWTH     Performed at [facility]   Culture       Value: NO GROWTH     Performed at [facility]   Report Status 03/18/2014 FINAL    URINALYSIS, ROUTINE W REFLEX MICROSCOPIC   Collection Time    03/17/14  2:31 PM      Result Value Ref Range   Color, Urine YELLOW  YELLOW   APPearance CLEAR  CLEAR   Specific Gravity, Urine <1.005 (*) 1.005 - 1.030   pH 6.5  5.0 - 8.0   Glucose, UA NEGATIVE  NEGATIVE mg/dL   Hgb urine dipstick NEGATIVE  NEGATIVE   Bilirubin Urine NEGATIVE  NEGATIVE   Ketones, ur NEGATIVE  NEGATIVE mg/dL   Protein, ur NEGATIVE  NEGATIVE mg/dL   Urobilinogen, UA 0.2  0.0 - 1.0 mg/dL   Nitrite NEGATIVE  NEGATIVE   Leukocytes, UA NEGATIVE  NEGATIVE  URINE RAPID DRUG SCREEN (HOSP PERFORMED)   Collection Time    03/17/14  2:31 PM      Result Value Ref Range   Opiates NONE DETECTED  NONE DETECTED   Cocaine NONE DETECTED  NONE DETECTED   Benzodiazepines NONE DETECTED  NONE DETECTED   Amphetamines NONE DETECTED  NONE DETECTED   Tetrahydrocannabinol NONE DETECTED  NONE DETECTED   Barbiturates NONE DETECTED  NONE DETECTED  CBC WITH DIFFERENTIAL   Collection Time    03/17/14  2:32 PM      Result Value Ref Range   WBC 6.0  4.0 - 10.5 K/uL   RBC 5.02  3.87 - 5.11 MIL/uL   Hemoglobin 14.1  12.0 - 15.0 g/dL   HCT 42.7  36.0 - 46.0 %   MCV 85.1  78.0 - 100.0 fL   MCH 28.1  26.0 - 34.0 pg   MCHC 33.0  30.0 - 36.0 g/dL   RDW 13.3  11.5 - 15.5 %   Platelets 214  150 - 400 K/uL   Neutrophils Relative % 64  43 - 77 %   Neutro Abs 3.8  1.7 - 7.7 K/uL   Lymphocytes  Relative 26  12 - 46 %   Lymphs Abs 1.6  0.7 - 4.0 K/uL   Monocytes Relative 8  3 - 12 %   Monocytes Absolute 0.5  0.1 - 1.0 K/uL   Eosinophils Relative 2  0 - 5 %   Eosinophils Absolute 0.1  0.0 - 0.7 K/uL   Basophils Relative 0  0 - 1 %   Basophils Absolute 0.0  0.0 - 0.1 K/uL  BASIC METABOLIC PANEL   Collection Time    03/17/14  2:32 PM      Result Value Ref Range   Sodium 138  137 - 147 mEq/L   Potassium 4.4  3.7 - 5.3 mEq/L   Chloride 99  96 - 112 mEq/L   CO2 29  19 - 32 mEq/L   Glucose, Bld 122 (*) 70 - 99 mg/dL   BUN 10  6 - 23 mg/dL   Creatinine, Ser 0.55  0.50 - 1.10 mg/dL   Calcium 10.0  8.4 - 10.5 mg/dL   GFR calc non Af Amer >90  >90 mL/min   GFR calc Af Amer >90  >90 mL/min   Anion gap 10  5 - 15  ETHANOL   Collection Time    03/17/14  2:32 PM      Result Value Ref Range   Alcohol, Ethyl (B) <11  0 -  11 mg/dL  CBG MONITORING, ED   Collection Time    03/18/14  9:07 AM      Result Value Ref Range   Glucose-Capillary 196 (*) 70 - 99 mg/dL  Results for orders placed during the hospital encounter of 07/23/13 (from the past 8736 hour(s))  POCT I-STAT, CHEM 8   Collection Time    07/23/13 10:27 AM      Result Value Ref Range   Sodium 140  135 - 145 mEq/L   Potassium 4.5  3.5 - 5.1 mEq/L   Chloride 101  96 - 112 mEq/L   BUN 20  6 - 23 mg/dL   Creatinine, Ser 0.90  0.50 - 1.10 mg/dL   Glucose, Bld 123 (*) 70 - 99 mg/dL   Calcium, Ion 1.26  1.13 - 1.30 mmol/L   TCO2 29  0 - 100 mmol/L   Hemoglobin 14.6  12.0 - 15.0 g/dL   HCT 43.0  36.0 - 46.0 %  CBC WITH DIFFERENTIAL   Collection Time    07/23/13  2:41 PM      Result Value Ref Range   WBC 9.2  4.0 - 10.5 K/uL   RBC 4.85  3.87 - 5.11 MIL/uL   Hemoglobin 13.0  12.0 - 15.0 g/dL   HCT 40.9  36.0 - 46.0 %   MCV 84.3  78.0 - 100.0 fL   MCH 26.8  26.0 - 34.0 pg   MCHC 31.8  30.0 - 36.0 g/dL   RDW 13.6  11.5 - 15.5 %   Platelets 205  150 - 400 K/uL   Neutrophils Relative % 76  43 - 77 %   Neutro Abs 7.0  1.7  - 7.7 K/uL   Lymphocytes Relative 14  12 - 46 %   Lymphs Abs 1.3  0.7 - 4.0 K/uL   Monocytes Relative 7  3 - 12 %   Monocytes Absolute 0.6  0.1 - 1.0 K/uL   Eosinophils Relative 2  0 - 5 %   Eosinophils Absolute 0.2  0.0 - 0.7 K/uL   Basophils Relative 0  0 - 1 %   Basophils Absolute 0.0  0.0 - 0.1 K/uL  BASIC METABOLIC PANEL   Collection Time    07/23/13  2:41 PM      Result Value Ref Range   Sodium 140  135 - 145 mEq/L   Potassium 4.2  3.5 - 5.1 mEq/L   Chloride 100  96 - 112 mEq/L   CO2 32  19 - 32 mEq/L   Glucose, Bld 100 (*) 70 - 99 mg/dL   BUN 15  6 - 23 mg/dL   Creatinine, Ser 0.64  0.50 - 1.10 mg/dL   Calcium 9.8  8.4 - 10.5 mg/dL   GFR calc non Af Amer >90  >90 mL/min   GFR calc Af Amer >90  >90 mL/min  GLUCOSE, CAPILLARY   Collection Time    07/23/13 10:30 PM      Result Value Ref Range   Glucose-Capillary 112 (*) 70 - 99 mg/dL  GLUCOSE, CAPILLARY   Collection Time    07/24/13  7:28 AM      Result Value Ref Range   Glucose-Capillary 113 (*) 70 - 99 mg/dL  GLUCOSE, CAPILLARY   Collection Time    07/24/13 11:54 AM      Result Value Ref Range   Glucose-Capillary 121 (*) 70 - 99 mg/dL  GLUCOSE, CAPILLARY   Collection Time    07/24/13  2:50 PM      Result Value Ref Range   Glucose-Capillary 152 (*) 70 - 99 mg/dL  GLUCOSE, CAPILLARY   Collection Time    07/24/13  4:23 PM      Result Value Ref Range   Glucose-Capillary 138 (*) 70 - 99 mg/dL   Comment 1 Documented in Chart     Comment 2 Notify RN    COMPREHENSIVE METABOLIC PANEL   Collection Time    07/24/13  4:58 PM      Result Value Ref Range   Sodium 129 (*) 135 - 145 mEq/L   Potassium 4.2  3.5 - 5.1 mEq/L   Chloride 90 (*) 96 - 112 mEq/L   CO2 29  19 - 32 mEq/L   Glucose, Bld 144 (*) 70 - 99 mg/dL   BUN 10  6 - 23 mg/dL   Creatinine, Ser 0.61  0.50 - 1.10 mg/dL   Calcium 8.8  8.4 - 10.5 mg/dL   Total Protein 7.2  6.0 - 8.3 g/dL   Albumin 3.5  3.5 - 5.2 g/dL   AST 37  0 - 37 U/L   ALT 25  0 - 35  U/L   Alkaline Phosphatase 76  39 - 117 U/L   Total Bilirubin 0.6  0.3 - 1.2 mg/dL   GFR calc non Af Amer >90  >90 mL/min   GFR calc Af Amer >90  >90 mL/min  CBC   Collection Time    07/24/13  4:58 PM      Result Value Ref Range   WBC 8.9  4.0 - 10.5 K/uL   RBC 4.29  3.87 - 5.11 MIL/uL   Hemoglobin 11.6 (*) 12.0 - 15.0 g/dL   HCT 35.8 (*) 36.0 - 46.0 %   MCV 83.4  78.0 - 100.0 fL   MCH 27.0  26.0 - 34.0 pg   MCHC 32.4  30.0 - 36.0 g/dL   RDW 13.7  11.5 - 15.5 %   Platelets 167  150 - 400 K/uL  GLUCOSE, CAPILLARY   Collection Time    07/24/13  9:44 PM      Result Value Ref Range   Glucose-Capillary 131 (*) 70 - 99 mg/dL  CBC   Collection Time    07/25/13  3:43 AM      Result Value Ref Range   WBC 8.4  4.0 - 10.5 K/uL   RBC 4.30  3.87 - 5.11 MIL/uL   Hemoglobin 11.7 (*) 12.0 - 15.0 g/dL   HCT 34.8 (*) 36.0 - 46.0 %   MCV 80.9  78.0 - 100.0 fL   MCH 27.2  26.0 - 34.0 pg   MCHC 33.6  30.0 - 36.0 g/dL   RDW 13.0  11.5 - 15.5 %   Platelets 174  150 - 400 K/uL  COMPREHENSIVE METABOLIC PANEL   Collection Time    07/25/13  3:43 AM      Result Value Ref Range   Sodium 124 (*) 135 - 145 mEq/L   Potassium 3.9  3.5 - 5.1 mEq/L   Chloride 87 (*) 96 - 112 mEq/L   CO2 28  19 - 32 mEq/L   Glucose, Bld 108 (*) 70 - 99 mg/dL   BUN 8  6 - 23 mg/dL   Creatinine, Ser 0.50  0.50 - 1.10 mg/dL   Calcium 8.7  8.4 - 10.5 mg/dL   Total Protein 6.8  6.0 - 8.3 g/dL   Albumin 3.2 (*) 3.5 - 5.2  g/dL   AST 34  0 - 37 U/L   ALT 22  0 - 35 U/L   Alkaline Phosphatase 72  39 - 117 U/L   Total Bilirubin 0.7  0.3 - 1.2 mg/dL   GFR calc non Af Amer >90  >90 mL/min   GFR calc Af Amer >90  >90 mL/min  GLUCOSE, CAPILLARY   Collection Time    07/25/13  6:47 AM      Result Value Ref Range   Glucose-Capillary 119 (*) 70 - 99 mg/dL  GLUCOSE, CAPILLARY   Collection Time    07/25/13 11:17 AM      Result Value Ref Range   Glucose-Capillary 124 (*) 70 - 99 mg/dL   Comment 1 Documented in Chart      Comment 2 Notify RN    GLUCOSE, CAPILLARY   Collection Time    07/25/13 12:28 PM      Result Value Ref Range   Glucose-Capillary 167 (*) 70 - 99 mg/dL  GLUCOSE, CAPILLARY   Collection Time    07/25/13  4:26 PM      Result Value Ref Range   Glucose-Capillary 114 (*) 70 - 99 mg/dL   Comment 1 Documented in Chart     Comment 2 Notify RN    GLUCOSE, CAPILLARY   Collection Time    07/25/13  9:09 PM      Result Value Ref Range   Glucose-Capillary 144 (*) 70 - 99 mg/dL  GLUCOSE, CAPILLARY   Collection Time    07/26/13  7:07 AM      Result Value Ref Range   Glucose-Capillary 131 (*) 70 - 99 mg/dL  COMPREHENSIVE METABOLIC PANEL   Collection Time    07/26/13  9:05 AM      Result Value Ref Range   Sodium 124 (*) 135 - 145 mEq/L   Potassium 3.6  3.5 - 5.1 mEq/L   Chloride 88 (*) 96 - 112 mEq/L   CO2 27  19 - 32 mEq/L   Glucose, Bld 132 (*) 70 - 99 mg/dL   BUN 11  6 - 23 mg/dL   Creatinine, Ser 0.51  0.50 - 1.10 mg/dL   Calcium 8.4  8.4 - 10.5 mg/dL   Total Protein 6.8  6.0 - 8.3 g/dL   Albumin 3.2 (*) 3.5 - 5.2 g/dL   AST 43 (*) 0 - 37 U/L   ALT 27  0 - 35 U/L   Alkaline Phosphatase 71  39 - 117 U/L   Total Bilirubin 0.5  0.3 - 1.2 mg/dL   GFR calc non Af Amer >90  >90 mL/min   GFR calc Af Amer >90  >90 mL/min  GLUCOSE, CAPILLARY   Collection Time    07/26/13 11:56 AM      Result Value Ref Range   Glucose-Capillary 118 (*) 70 - 99 mg/dL  CBC   Collection Time    07/26/13 12:00 PM      Result Value Ref Range   WBC 10.0  4.0 - 10.5 K/uL   RBC 4.29  3.87 - 5.11 MIL/uL   Hemoglobin 11.7 (*) 12.0 - 15.0 g/dL   HCT 34.7 (*) 36.0 - 46.0 %   MCV 80.9  78.0 - 100.0 fL   MCH 27.3  26.0 - 34.0 pg   MCHC 33.7  30.0 - 36.0 g/dL   RDW 13.3  11.5 - 15.5 %   Platelets 178  150 - 400 K/uL  GLUCOSE, CAPILLARY   Collection  Time    07/26/13  4:37 PM      Result Value Ref Range   Glucose-Capillary 113 (*) 70 - 99 mg/dL  Family doctor is following her labs  Assessment Axis I Maj.  depressive disorder, insomnia, pain issues, HIstory of alcoholism. Axis II deferred Axis III see medical history Axis IV mild to moderate  Plan: She she'll continue trazodone and Ativan  and Effexor XR 150 mg mg daily. She will use Risperdal 0.5 mg qhs only as needed. She'll return in four-weeks  MEDICATIONS this encounter: Meds ordered this encounter  Medications  . DISCONTD: LORazepam (ATIVAN) 1 MG tablet    Sig: Take 1 mg by mouth 3 (three) times daily. For anxiety  . Oxycodone-Acetaminophen (PERCOCET PO)    Sig: Take by mouth as needed.  . venlafaxine XR (EFFEXOR-XR) 150 MG 24 hr capsule    Sig: Take 1 capsule (150 mg total) by mouth daily with breakfast.    Dispense:  30 capsule    Refill:  2  . traZODone (DESYREL) 50 MG tablet    Sig: Take 1 tablet (50 mg total) by mouth at bedtime.    Dispense:  30 tablet    Refill:  2  . DISCONTD: LORazepam (ATIVAN) 1 MG tablet    Sig: Take 1 tablet (1 mg total) by mouth 3 (three) times daily. For anxiety    Dispense:  90 tablet    Refill:  2  . LORazepam (ATIVAN) 1 MG tablet    Sig: Take 1 tablet (1 mg total) by mouth 3 (three) times daily. For anxiety    Dispense:  90 tablet    Refill:  2    Medical Decision Making Problem Points:  Established problem, worsening (2), Review of last therapy session (1) and Review of psycho-social stressors (1) Data Points:  Review or order clinical lab tests (1) Review of medication regiment & side effects (2) Review of new medications or change in dosage (2)  I certify that outpatient services furnished can reasonably be expected to improve the patient's condition.   Levonne Spiller, MD

## 2014-06-26 ENCOUNTER — Ambulatory Visit (INDEPENDENT_AMBULATORY_CARE_PROVIDER_SITE_OTHER): Payer: 59 | Admitting: Psychology

## 2014-06-26 DIAGNOSIS — F39 Unspecified mood [affective] disorder: Secondary | ICD-10-CM

## 2014-06-26 DIAGNOSIS — F419 Anxiety disorder, unspecified: Secondary | ICD-10-CM

## 2014-07-14 ENCOUNTER — Ambulatory Visit (INDEPENDENT_AMBULATORY_CARE_PROVIDER_SITE_OTHER): Payer: 59 | Admitting: Psychology

## 2014-07-14 DIAGNOSIS — F419 Anxiety disorder, unspecified: Secondary | ICD-10-CM

## 2014-07-14 DIAGNOSIS — F39 Unspecified mood [affective] disorder: Secondary | ICD-10-CM

## 2014-07-22 ENCOUNTER — Ambulatory Visit (INDEPENDENT_AMBULATORY_CARE_PROVIDER_SITE_OTHER): Payer: 59 | Admitting: Psychiatry

## 2014-07-22 ENCOUNTER — Encounter (HOSPITAL_COMMUNITY): Payer: Self-pay | Admitting: Psychiatry

## 2014-07-22 VITALS — BP 135/88 | HR 86 | Ht 66.0 in | Wt 135.0 lb

## 2014-07-22 DIAGNOSIS — F39 Unspecified mood [affective] disorder: Secondary | ICD-10-CM

## 2014-07-22 DIAGNOSIS — G4700 Insomnia, unspecified: Secondary | ICD-10-CM

## 2014-07-22 DIAGNOSIS — F329 Major depressive disorder, single episode, unspecified: Secondary | ICD-10-CM

## 2014-07-22 MED ORDER — TRAZODONE HCL 50 MG PO TABS: 50.0000 mg | ORAL_TABLET | Freq: Every day | ORAL | Status: DC

## 2014-07-22 MED ORDER — RISPERIDONE 0.5 MG PO TABS: 0.5000 mg | ORAL_TABLET | Freq: Every day | ORAL | Status: DC

## 2014-07-22 MED ORDER — LAMOTRIGINE 25 MG PO TABS: ORAL_TABLET | ORAL | Status: DC

## 2014-07-22 NOTE — Progress Notes (Signed)
Patient ID: Lauren [name], female   DOB: Mar 25, 1951, 63 y.o.   MRN: [id number] Patient ID: Lauren [name], female   DOB: [birth year], 63 y.o.   MRN: [id number] Patient ID: Lauren [name], female   DOB: 10/13/1950, 63 y.o.   MRN: [id number] Patient ID: Lauren [name], female   DOB: 07-08-1951, 63 y.o.   MRN: [id number] Patient ID: Lauren [name], female   DOB: 31-Jan-1951, 63 y.o.   MRN: [id number] Patient ID: Lauren [name], female   DOB: Jan 10, 1951, 63 y.o.   MRN: [id number] Patient ID: Lauren [name], female   DOB: [birth year], 63 y.o.   MRN: [id number] Patient ID: Lauren [name], female   DOB: 1950-11-14, 64 y.o.   MRN: [id number] Patient ID: Lauren [name], female   DOB: 08-Jul-1951, 63 y.o.   MRN: [id number] Patient ID: Lauren [name], female   DOB: 11-07-1950, 63 y.o.   MRN: [id number] Patient ID: Lauren [name], female   DOB: 1951-06-26, 63 y.o.   MRN: [id number] [place] [address] Progress Note Lauren [name] MRN: [id number] DOB: 21-Sep-1950 Age: 63 y.o.  Date: 07/22/2014  Chief Complaint: Chief Complaint  Patient presents with  . Anxiety  . Manic Behavior  . Follow-up   Subjective: I've been stressed  History of presenting illness This patient is a 63 year old married white female who lives with her husband. She has one stepchild. Her step son killed himself 7 years ago at age 24. He was an alcoholic. 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  The patient has a long history of depression. She was last hospitalized in 2010 after she took too many pain pills and got confused. She use to drink heavily but has been sober 23 years and is very active in [employer] and church. For the most part she feels her mood is stable. She sleeping pretty well. Her energy is good. She has a hard time functioning without Ativan but she only usually takes one pill a day side think this is reasonable. She tried a higher dose of  Cymbalta but it made her manic and revved up  The patient returns after four-week's. She states that her husband and several of her friends think that she is bipolar. She has severe mood swings at times. She snaps often gets irritable easily. She would like to try something to help with this and explained that most the medications had pretty significant side effects. However she is willing to try Lamictal and get on it  Gradually.  Allergies: Allergies  Allergen Reactions  . Elavil [Amitriptyline] Other (See Comments)    Vivid dreams and almost suicidal  . Flagyl [Metronidazole] Itching  . Vistaril [Hydroxyzine Hcl] Other (See Comments)    Got higher than a kite on too much of this.  Lindajo Royal [Ziprasidone Hydrochloride]   . Lactose Intolerance (Gi)   Medical History: Past Medical History  Diagnosis Date  . Depression   . Osteoarthritis   . Fibromyalgia   . IBS (irritable bowel syndrome)   . GERD (gastroesophageal reflux disease)   . Prediabetes   . Chronic pain   Surgical History: Past Surgical History  Procedure Laterality Date  . Tonsillectomy    . Cholecystectomy    . Colonoscopy    . Upper gastrointestinal endoscopy    . Mandible surgery  09/06/1979  . Orif wrist fracture Right 07/23/2013    Procedure: OPEN REDUCTION INTERNAL  FIXATION (ORIF) WRIST FRACTURE;  Surgeon: Roseanne Kaufman, MD;  Location: [facility];  Service: Orthopedics;  Laterality: Right;  Family History family history includes Alcohol abuse in her cousin, father, maternal grandfather, maternal grandmother, paternal grandfather, paternal grandmother, and paternal uncle; Anxiety disorder in her maternal uncle and paternal uncle; Depression in her father; Diabetes in her brother, brother, and father; Healthy in her brother; Heart disease in her brother; Irritable bowel syndrome in her mother; OCD in her brother and brother; Sexual abuse in her mother; Stroke in her father and mother. There is no history of ADD / ADHD,  Bipolar disorder, Dementia, Drug abuse, Paranoid behavior, Schizophrenia, Seizures, or Physical abuse. Reviewed al this again to day in the appointment and nothing has changed  Mental status examination Patient is neatly dressed and groomed.  She she states her mood is anxious and her affect is mood congruent.  She denies paranoia and she denies any hallucination. She denies suicidal and homicidal ideationbut did have some passive suicidal ideation over the past weekend , She denies auditory and visual hallucinations or paranoia Her attention and concentration is fair. Her thought process is normal  She's alert and oriented x3 and her insight judgment and impulse control are good  Lab Results:  Results for orders placed or performed during the hospital encounter of 03/18/14 (from the past 8736 hour(s))  Glucose, capillary   Collection Time: 03/18/14 12:08 PM  Result Value Ref Range   Glucose-Capillary 114 (H) 70 - 99 mg/dL  Glucose, capillary   Collection Time: 03/18/14  5:09 PM  Result Value Ref Range   Glucose-Capillary 110 (H) 70 - 99 mg/dL   Comment 1 Notify RN   TSH   Collection Time: 03/19/14  6:30 AM  Result Value Ref Range   TSH 2.400 0.350 - 4.500 uIU/mL  Glucose, capillary   Collection Time: 03/19/14  6:31 AM  Result Value Ref Range   Glucose-Capillary 140 (H) 70 - 99 mg/dL  Glucose, capillary   Collection Time: 03/19/14  4:54 PM  Result Value Ref Range   Glucose-Capillary 120 (H) 70 - 99 mg/dL   Comment 1 Notify RN   Glucose, capillary   Collection Time: 03/20/14  5:59 AM  Result Value Ref Range   Glucose-Capillary 119 (H) 70 - 99 mg/dL  Glucose, capillary   Collection Time: 03/20/14  4:43 PM  Result Value Ref Range   Glucose-Capillary 121 (H) 70 - 99 mg/dL   Comment 1 Notify RN   Glucose, capillary   Collection Time: 03/21/14  6:06 AM  Result Value Ref Range   Glucose-Capillary 135 (H) 70 - 99 mg/dL  Results for orders placed or performed during the hospital  encounter of 03/17/14 (from the past 8736 hour(s))  Urine culture   Collection Time: 03/17/14  2:31 PM  Result Value Ref Range   Specimen Description URINE, CLEAN CATCH    Special Requests NONE    Culture  Setup Time      03/17/2014 20:40 Performed at [place] Performed at [facility]    Culture NO GROWTH Performed at [facility]    Report Status 03/18/2014 FINAL   Urinalysis, Routine w reflex microscopic   Collection Time: 03/17/14  2:31 PM  Result Value Ref Range   Color, Urine YELLOW YELLOW   APPearance CLEAR CLEAR   Specific Gravity, Urine <1.005 (L) 1.005 - 1.030   pH 6.5 5.0 - 8.0   Glucose, UA NEGATIVE  NEGATIVE mg/dL   Hgb urine dipstick NEGATIVE NEGATIVE   Bilirubin Urine NEGATIVE NEGATIVE   Ketones, ur NEGATIVE NEGATIVE mg/dL   Protein, ur NEGATIVE NEGATIVE mg/dL   Urobilinogen, UA 0.2 0.0 - 1.0 mg/dL   Nitrite NEGATIVE NEGATIVE   Leukocytes, UA NEGATIVE NEGATIVE  Urine rapid drug screen (hosp performed)   Collection Time: 03/17/14  2:31 PM  Result Value Ref Range   Opiates NONE DETECTED NONE DETECTED   Cocaine NONE DETECTED NONE DETECTED   Benzodiazepines NONE DETECTED NONE DETECTED   Amphetamines NONE DETECTED NONE DETECTED   Tetrahydrocannabinol NONE DETECTED NONE DETECTED   Barbiturates NONE DETECTED NONE DETECTED  CBC with Differential   Collection Time: 03/17/14  2:32 PM  Result Value Ref Range   WBC 6.0 4.0 - 10.5 K/uL   RBC 5.02 3.87 - 5.11 MIL/uL   Hemoglobin 14.1 12.0 - 15.0 g/dL   HCT 42.7 36.0 - 46.0 %   MCV 85.1 78.0 - 100.0 fL   MCH 28.1 26.0 - 34.0 pg   MCHC 33.0 30.0 - 36.0 g/dL   RDW 13.3 11.5 - 15.5 %   Platelets 214 150 - 400 K/uL   Neutrophils Relative % 64 43 - 77 %   Neutro Abs 3.8 1.7 - 7.7 K/uL   Lymphocytes Relative 26 12 - 46 %   Lymphs Abs 1.6 0.7 - 4.0 K/uL   Monocytes Relative 8 3 - 12 %   Monocytes Absolute 0.5 0.1 - 1.0 K/uL   Eosinophils Relative 2 0 - 5 %    Eosinophils Absolute 0.1 0.0 - 0.7 K/uL   Basophils Relative 0 0 - 1 %   Basophils Absolute 0.0 0.0 - 0.1 K/uL  Basic metabolic panel   Collection Time: 03/17/14  2:32 PM  Result Value Ref Range   Sodium 138 137 - 147 mEq/L   Potassium 4.4 3.7 - 5.3 mEq/L   Chloride 99 96 - 112 mEq/L   CO2 29 19 - 32 mEq/L   Glucose, Bld 122 (H) 70 - 99 mg/dL   BUN 10 6 - 23 mg/dL   Creatinine, Ser 0.55 0.50 - 1.10 mg/dL   Calcium 10.0 8.4 - 10.5 mg/dL   GFR calc non Af Amer >90 >90 mL/min   GFR calc Af Amer >90 >90 mL/min   Anion gap 10 5 - 15  Ethanol   Collection Time: 03/17/14  2:32 PM  Result Value Ref Range   Alcohol, Ethyl (B) <11 0 - 11 mg/dL  POC CBG, ED   Collection Time: 03/18/14  9:07 AM  Result Value Ref Range   Glucose-Capillary 196 (H) 70 - 99 mg/dL  Results for orders placed or performed during the hospital encounter of 07/23/13 (from the past 8736 hour(s))  Glucose, capillary   Collection Time: 07/23/13 10:30 PM  Result Value Ref Range   Glucose-Capillary 112 (H) 70 - 99 mg/dL  Glucose, capillary   Collection Time: 07/24/13  7:28 AM  Result Value Ref Range   Glucose-Capillary 113 (H) 70 - 99 mg/dL  Glucose, capillary   Collection Time: 07/24/13 11:54 AM  Result Value Ref Range   Glucose-Capillary 121 (H) 70 - 99 mg/dL  Glucose, capillary   Collection Time: 07/24/13  2:50 PM  Result Value Ref Range   Glucose-Capillary 152 (H) 70 - 99 mg/dL  Glucose, capillary   Collection Time: 07/24/13  4:23 PM  Result Value Ref Range   Glucose-Capillary 138 (H) 70 - 99 mg/dL   Comment 1 Documented  in Chart    Comment 2 Notify RN   Comprehensive metabolic panel   Collection Time: 07/24/13  4:58 PM  Result Value Ref Range   Sodium 129 (L) 135 - 145 mEq/L   Potassium 4.2 3.5 - 5.1 mEq/L   Chloride 90 (L) 96 - 112 mEq/L   CO2 29 19 - 32 mEq/L   Glucose, Bld 144 (H) 70 - 99 mg/dL   BUN 10 6 - 23 mg/dL   Creatinine, Ser 0.61 0.50 - 1.10 mg/dL   Calcium 8.8 8.4 - 10.5 mg/dL    Total Protein 7.2 6.0 - 8.3 g/dL   Albumin 3.5 3.5 - 5.2 g/dL   AST 37 0 - 37 U/L   ALT 25 0 - 35 U/L   Alkaline Phosphatase 76 39 - 117 U/L   Total Bilirubin 0.6 0.3 - 1.2 mg/dL   GFR calc non Af Amer >90 >90 mL/min   GFR calc Af Amer >90 >90 mL/min  CBC   Collection Time: 07/24/13  4:58 PM  Result Value Ref Range   WBC 8.9 4.0 - 10.5 K/uL   RBC 4.29 3.87 - 5.11 MIL/uL   Hemoglobin 11.6 (L) 12.0 - 15.0 g/dL   HCT 35.8 (L) 36.0 - 46.0 %   MCV 83.4 78.0 - 100.0 fL   MCH 27.0 26.0 - 34.0 pg   MCHC 32.4 30.0 - 36.0 g/dL   RDW 13.7 11.5 - 15.5 %   Platelets 167 150 - 400 K/uL  Glucose, capillary   Collection Time: 07/24/13  9:44 PM  Result Value Ref Range   Glucose-Capillary 131 (H) 70 - 99 mg/dL  CBC   Collection Time: 07/25/13  3:43 AM  Result Value Ref Range   WBC 8.4 4.0 - 10.5 K/uL   RBC 4.30 3.87 - 5.11 MIL/uL   Hemoglobin 11.7 (L) 12.0 - 15.0 g/dL   HCT 34.8 (L) 36.0 - 46.0 %   MCV 80.9 78.0 - 100.0 fL   MCH 27.2 26.0 - 34.0 pg   MCHC 33.6 30.0 - 36.0 g/dL   RDW 13.0 11.5 - 15.5 %   Platelets 174 150 - 400 K/uL  Comprehensive metabolic panel   Collection Time: 07/25/13  3:43 AM  Result Value Ref Range   Sodium 124 (L) 135 - 145 mEq/L   Potassium 3.9 3.5 - 5.1 mEq/L   Chloride 87 (L) 96 - 112 mEq/L   CO2 28 19 - 32 mEq/L   Glucose, Bld 108 (H) 70 - 99 mg/dL   BUN 8 6 - 23 mg/dL   Creatinine, Ser 0.50 0.50 - 1.10 mg/dL   Calcium 8.7 8.4 - 10.5 mg/dL   Total Protein 6.8 6.0 - 8.3 g/dL   Albumin 3.2 (L) 3.5 - 5.2 g/dL   AST 34 0 - 37 U/L   ALT 22 0 - 35 U/L   Alkaline Phosphatase 72 39 - 117 U/L   Total Bilirubin 0.7 0.3 - 1.2 mg/dL   GFR calc non Af Amer >90 >90 mL/min   GFR calc Af Amer >90 >90 mL/min  Glucose, capillary   Collection Time: 07/25/13  6:47 AM  Result Value Ref Range   Glucose-Capillary 119 (H) 70 - 99 mg/dL  Glucose, capillary   Collection Time: 07/25/13 11:17 AM  Result Value Ref Range   Glucose-Capillary 124 (H) 70 - 99 mg/dL   Comment  1 Documented in Chart    Comment 2 Notify RN   Glucose, capillary   Collection Time: 07/25/13  12:28 PM  Result Value Ref Range   Glucose-Capillary 167 (H) 70 - 99 mg/dL  Glucose, capillary   Collection Time: 07/25/13  4:26 PM  Result Value Ref Range   Glucose-Capillary 114 (H) 70 - 99 mg/dL   Comment 1 Documented in Chart    Comment 2 Notify RN   Glucose, capillary   Collection Time: 07/25/13  9:09 PM  Result Value Ref Range   Glucose-Capillary 144 (H) 70 - 99 mg/dL  Glucose, capillary   Collection Time: 07/26/13  7:07 AM  Result Value Ref Range   Glucose-Capillary 131 (H) 70 - 99 mg/dL  Comprehensive metabolic panel   Collection Time: 07/26/13  9:05 AM  Result Value Ref Range   Sodium 124 (L) 135 - 145 mEq/L   Potassium 3.6 3.5 - 5.1 mEq/L   Chloride 88 (L) 96 - 112 mEq/L   CO2 27 19 - 32 mEq/L   Glucose, Bld 132 (H) 70 - 99 mg/dL   BUN 11 6 - 23 mg/dL   Creatinine, Ser 0.51 0.50 - 1.10 mg/dL   Calcium 8.4 8.4 - 10.5 mg/dL   Total Protein 6.8 6.0 - 8.3 g/dL   Albumin 3.2 (L) 3.5 - 5.2 g/dL   AST 43 (H) 0 - 37 U/L   ALT 27 0 - 35 U/L   Alkaline Phosphatase 71 39 - 117 U/L   Total Bilirubin 0.5 0.3 - 1.2 mg/dL   GFR calc non Af Amer >90 >90 mL/min   GFR calc Af Amer >90 >90 mL/min  Glucose, capillary   Collection Time: 07/26/13 11:56 AM  Result Value Ref Range   Glucose-Capillary 118 (H) 70 - 99 mg/dL  CBC   Collection Time: 07/26/13 12:00 PM  Result Value Ref Range   WBC 10.0 4.0 - 10.5 K/uL   RBC 4.29 3.87 - 5.11 MIL/uL   Hemoglobin 11.7 (L) 12.0 - 15.0 g/dL   HCT 34.7 (L) 36.0 - 46.0 %   MCV 80.9 78.0 - 100.0 fL   MCH 27.3 26.0 - 34.0 pg   MCHC 33.7 30.0 - 36.0 g/dL   RDW 13.3 11.5 - 15.5 %   Platelets 178 150 - 400 K/uL  Glucose, capillary   Collection Time: 07/26/13  4:37 PM  Result Value Ref Range   Glucose-Capillary 113 (H) 70 - 99 mg/dL  Family doctor is following her labs  Assessment Axis I Maj. depressive disorder, insomnia, pain issues, HIstory  of alcoholism. Axis II deferred Axis III see medical history Axis IV mild to moderate  Plan: She she'll continue trazodone and Ativan  and Effexor XR 150 mg mg daily. She will use Risperdal 0.5 mg qhs.she'll start Lamictal 25 mg per day for 1 week and gradually work up to 100 mg per day over 4 weeksShe'll return in four-weeks  MEDICATIONS this encounter: Meds ordered this encounter  Medications  . lamoTRIgine (LAMICTAL) 25 MG tablet    Sig: Take one tablet daily for one week, then one twice a day for 1 week, then three a day for one week, then 2 twice a day    Dispense:  30 tablet    Refill:  2  . traZODone (DESYREL) 50 MG tablet    Sig: Take 1 tablet (50 mg total) by mouth at bedtime.    Dispense:  30 tablet    Refill:  2  . risperiDONE (RISPERDAL) 0.5 MG tablet    Sig: Take 1 tablet (0.5 mg total) by mouth at bedtime.  Dispense:  30 tablet    Refill:  2    Medical Decision Making Problem Points:  Established problem, worsening (2), Review of last therapy session (1) and Review of psycho-social stressors (1) Data Points:  Review or order clinical lab tests (1) Review of medication regiment & side effects (2) Review of new medications or change in dosage (2)  I certify that outpatient services furnished can reasonably be expected to improve the patient's condition.   Levonne Spiller, MD

## 2014-07-24 ENCOUNTER — Encounter (INDEPENDENT_AMBULATORY_CARE_PROVIDER_SITE_OTHER): Payer: Self-pay | Admitting: *Deleted

## 2014-07-25 ENCOUNTER — Encounter (HOSPITAL_COMMUNITY): Payer: Self-pay | Admitting: Psychology

## 2014-07-25 NOTE — Progress Notes (Signed)
     PROGRESS NOTE   Patient:  Lauren [name]   DOB: Jul 17, 1951  MR Number: [id number]  Location: [facility] [address] [address] Dept: [phone]  Start: 2 PM End: 3 PM  Provider/Observer:     Edgardo Roys PSYD  Chief Complaint:      Chief Complaint  Patient presents with  . Anxiety  . Depression  . Agitation  . Stress  . Trauma    Reason For Service:     The patient has a long history of major depressive condition and ongoing underlying depressive disorder. She is a recovering alcoholic but has many many years this Friday. Significant symptoms of anxiety have played a major role in substance abuse in the past. Significant medical problems and her inability to work have complicated her situation. When all these difficulties are put in the context and relationship to potential workability it is clear that she is disabled not do not expect her to return to gainful employment.  Interventions Strategy:  Cognitive/behavioral psychotherapy  Participation Level:   Active  Participation Quality:  Appropriate      Behavioral Observation:  Well Groomed, Alert, and Appropriate.   Current Psychosocial Factors: The patient reports that she has increased her stability a little bit over the last time I saw her. The patient reports that there've been a lot of acute stressors going on in her life recently but she has been actively utilizing her coping skills and strategies to better deal with these situations..    Content of Session:   Review current symptoms and continue to work on therapeutic interventions for improved coping skills and strategies.  Current Status:   The patient reports that she has had an acute episode of decompensation recently with the death of her friend's husband from suicide and other psychosocial stressors.  Patient Progress:   Stable and  improving  Target Goals:   Reduce the frequency of depressive events including feelings of hopelessness and helplessness, anhedonia, social withdrawal, as well as significant anxiety symptoms related to social avoidance, fear of abandonment and fear of inability to manage and cope with her world. We will also look at continuing complete sobriety.  Last Reviewed:   07/14/2014   Goals Addressed Today:    Target goals addressed today have to do with issues of depression in particular feelings of helplessness and hopelessness.   Impression/Diagnosis:   The patient has a long history of depressive disorder and underlying anxiety disorder. She has had numerous medical issues including severe fibromyalgia and severe arthritis. She has been diagnosed with significant arthritis throughout her body and in particular her hands and her feet. Numerous events and left are overwhelmed and unable to work. The patient has had periodic times of improvement for primary symptoms but there are other times where the symptoms are so problematic that she has not been able to maintain gainful employment.  Diagnosis:    Axis I:  Mood disorder  Anxiety    RODENBOUGH,JOHN R, PsyD 07/25/2014

## 2014-07-28 ENCOUNTER — Telehealth (HOSPITAL_COMMUNITY): Payer: Self-pay | Admitting: *Deleted

## 2014-07-28 NOTE — Telephone Encounter (Signed)
Pt calling asking if she should still be taking her Effexor. Pt states when she takes this medications, she still feel depressed. Pt is not suicidal. Per pt she keeps crying. Pt would like to talk to Dr. Harrington Challenger in what she need to do [phone].

## 2014-07-29 ENCOUNTER — Telehealth (HOSPITAL_COMMUNITY): Payer: Self-pay | Admitting: *Deleted

## 2014-07-29 NOTE — Telephone Encounter (Signed)
Tried to call, no answer

## 2014-07-29 NOTE — Telephone Encounter (Signed)
Pt calling stating she is not feeling suicidal right now but have had suicidal thoughts for the past 3 months. Per pt, she feels like if she went out of town and not stay in the house, she will feel much better. Per pt, staying in the house have been making her to have thoughts of taking pills to kill herself and just crying off and on during the day. Pt would like to know if she should take her Lamictal in the morning or even and should she stop taking her Effexor or keep taking it? Per pt, she don't think her Effexor is working. Per pt, she do not want to be admitted to inpatient,she just think if she leaves the house for the holiday, she will be fine. Per pt, she would like to talk to Dr. Harrington Challenger about her medicines. Pt number is [phone].

## 2014-07-29 NOTE — Telephone Encounter (Signed)
Spoke to husband, Lamictal to be taken at night

## 2014-07-29 NOTE — Telephone Encounter (Signed)
Pt returning Dr. Harrington Challenger call. Pt called stating she do not know what time off the day to take her Lamictal. Pt number is [phone].

## 2014-07-30 ENCOUNTER — Ambulatory Visit (INDEPENDENT_AMBULATORY_CARE_PROVIDER_SITE_OTHER): Payer: 59 | Admitting: Psychology

## 2014-07-30 DIAGNOSIS — F419 Anxiety disorder, unspecified: Secondary | ICD-10-CM

## 2014-07-30 DIAGNOSIS — F39 Unspecified mood [affective] disorder: Secondary | ICD-10-CM

## 2014-08-07 ENCOUNTER — Telehealth (HOSPITAL_COMMUNITY): Payer: Self-pay | Admitting: *Deleted

## 2014-08-07 NOTE — Telephone Encounter (Signed)
Tell her to give quantity of 120, 2 refills

## 2014-08-07 NOTE — Telephone Encounter (Signed)
Tammy from pt pharmacy calling stating pt Lamictal quantity is not enough because of pt SIG. Pt is suppose to be increasing her sig every week. Tammy needs authorization to increase pt quantity on her Lamictal. Pt medication was last filled 07-22-14.

## 2014-08-07 NOTE — Telephone Encounter (Signed)
Tammy from [facility]. Lamictal 25 mg. now ready to go to 4 a day.   Script is only for 30.

## 2014-08-11 ENCOUNTER — Telehealth (HOSPITAL_COMMUNITY): Payer: Self-pay | Admitting: *Deleted

## 2014-08-11 NOTE — Telephone Encounter (Signed)
lmtcb to resch appt due to provider being out of office during scheduled appt. number provided

## 2014-08-12 ENCOUNTER — Ambulatory Visit (INDEPENDENT_AMBULATORY_CARE_PROVIDER_SITE_OTHER): Payer: 59 | Admitting: Psychology

## 2014-08-12 DIAGNOSIS — F39 Unspecified mood [affective] disorder: Secondary | ICD-10-CM

## 2014-08-12 DIAGNOSIS — F419 Anxiety disorder, unspecified: Secondary | ICD-10-CM

## 2014-08-13 ENCOUNTER — Encounter (HOSPITAL_COMMUNITY): Payer: Self-pay | Admitting: Psychology

## 2014-08-13 NOTE — Progress Notes (Signed)
      PROGRESS NOTE   Patient:  Lauren [name]   DOB: May 29, 1951  MR Number: [id number]  Location: [facility] [address] [place] [address] Dept: [phone]  Start: 3 PM End: 4 PM  Provider/Observer:     Edgardo Roys PSYD  Chief Complaint:      Chief Complaint  Patient presents with  . Agitation  . Anxiety  . Depression    Reason For Service:     The patient has a long history of major depressive condition and ongoing underlying depressive disorder. She is a recovering alcoholic but has many many years this Friday. Significant symptoms of anxiety have played a major role in substance abuse in the past. Significant medical problems and her inability to work have complicated her situation. When all these difficulties are put in the context and relationship to potential workability it is clear that she is disabled not do not expect her to return to gainful employment.  Interventions Strategy:  Cognitive/behavioral psychotherapy  Participation Level:   Active  Participation Quality:  Appropriate      Behavioral Observation:  Well Groomed, Alert, and Appropriate.   Current Psychosocial Factors: The patient reports that she has been doing better overall but is beginning to feel more anxiety than she was her last visit. We worked on Radiographer, therapeutic and strategies. She continues to ruminate about guilty feelings about things that she really had little control over her life..    Content of Session:   Review current symptoms and continue to work on therapeutic interventions for improved coping skills and strategies.  Current Status:   The patient reports that she has been experiencing improved mood and not as much anxieity, guilt and depression.  Patient Progress:   Stable and improving  Target Goals:   Reduce the frequency of depressive events including feelings of hopelessness and  helplessness, anhedonia, social withdrawal, as well as significant anxiety symptoms related to social avoidance, fear of abandonment and fear of inability to manage and cope with her world. We will also look at continuing complete sobriety.  Last Reviewed:   06/03/2014   Goals Addressed Today:    Target goals addressed today have to do with issues of depression in particular feelings of helplessness and hopelessness.   Impression/Diagnosis:   The patient has a long history of depressive disorder and underlying anxiety disorder. She has had numerous medical issues including severe fibromyalgia and severe arthritis. She has been diagnosed with significant arthritis throughout her body and in particular her hands and her feet. Numerous events and left are overwhelmed and unable to work. The patient has had periodic times of improvement for primary symptoms but there are other times where the symptoms are so problematic that she has not been able to maintain gainful employment.  Diagnosis:    Axis I:  Mood disorder  Anxiety    RODENBOUGH,JOHN R, PsyD 08/13/2014

## 2014-08-13 NOTE — Progress Notes (Signed)
      PROGRESS NOTE   Patient:  Lauren [name]   DOB: 1951/04/26  MR Number: [id number]  Location: [facility] [address] [place] [address] Dept: [phone]  Start: 2 PM End: 3 PM  Provider/Observer:     Edgardo Roys PSYD  Chief Complaint:      Chief Complaint  Patient presents with  . Anxiety  . Depression    Reason For Service:     The patient has a long history of major depressive condition and ongoing underlying depressive disorder. She is a recovering alcoholic but has many many years this Friday. Significant symptoms of anxiety have played a major role in substance abuse in the past. Significant medical problems and her inability to work have complicated her situation. When all these difficulties are put in the context and relationship to potential workability it is clear that she is disabled not do not expect her to return to gainful employment.  Interventions Strategy:  Cognitive/behavioral psychotherapy  Participation Level:   Active  Participation Quality:  Appropriate      Behavioral Observation:  Well Groomed, Alert, and Appropriate.   Current Psychosocial Factors: The patient reports that she has been getting around spend time with some of her friends but ultimately ends up taking care of them or feeling guilty that she cannot do more. The patient reports things are going better at home with her husband .    Content of Session:   Review current symptoms and continue to work on therapeutic interventions for improved coping skills and strategies.  Current Status:   The patient reports that she has been doing much better than she was after her acute decompensation about 3 weeks ago. The patient reports that while she continues to have significant depression anxiety she has shown steady improvement persistent improvement.  Patient Progress:   Stable and  improving  Target Goals:   Reduce the frequency of depressive events including feelings of hopelessness and helplessness, anhedonia, social withdrawal, as well as significant anxiety symptoms related to social avoidance, fear of abandonment and fear of inability to manage and cope with her world. We will also look at continuing complete sobriety.  Last Reviewed:   08/13/2014   Goals Addressed Today:    Target goals addressed today have to do with issues of depression in particular feelings of helplessness and hopelessness.   Impression/Diagnosis:   The patient has a long history of depressive disorder and underlying anxiety disorder. She has had numerous medical issues including severe fibromyalgia and severe arthritis. She has been diagnosed with significant arthritis throughout her body and in particular her hands and her feet. Numerous events and left are overwhelmed and unable to work. The patient has had periodic times of improvement for primary symptoms but there are other times where the symptoms are so problematic that she has not been able to maintain gainful employment.  Diagnosis:    Axis I:  Mood disorder  Anxiety    RODENBOUGH,JOHN R, PsyD 08/13/2014     RODENBOUGH,JOHN R, PsyD 08/13/2014

## 2014-08-13 NOTE — Progress Notes (Signed)
      PROGRESS NOTE   Patient:  Lauren [name]   DOB: 1951/08/31  MR Number: [id number]  Location: [facility] [address] [place] [address] Dept: [phone]  Start: 2 PM End: 3 PM  Provider/Observer:     Edgardo Roys PSYD  Chief Complaint:      Chief Complaint  Patient presents with  . Anxiety  . Agitation  . Stress  . Depression    Reason For Service:     The patient has a long history of major depressive condition and ongoing underlying depressive disorder. She is a recovering alcoholic but has many many years this Friday. Significant symptoms of anxiety have played a major role in substance abuse in the past. Significant medical problems and her inability to work have complicated her situation. When all these difficulties are put in the context and relationship to potential workability it is clear that she is disabled not do not expect her to return to gainful employment.  Interventions Strategy:  Cognitive/behavioral psychotherapy  Participation Level:   Active  Participation Quality:  Appropriate      Behavioral Observation:  Well Groomed, Alert, and Appropriate.   Current Psychosocial Factors: The patient reports that she continues to be doing very poorly. She reports that the sudden death of one of her friend's husband has added to a lot of stress that she tends to carry on her shoulders feel like it is her issues to manage and take care of .    Content of Session:   Review current symptoms and continue to work on therapeutic interventions for improved coping skills and strategies.  Current Status:   The patient reports that she has had an acute episode of decompensation recently with the death of her friend's husband from suicide and other psychosocial stressors.  Patient Progress:   Stable and improving  Target Goals:   Reduce the frequency of depressive events  including feelings of hopelessness and helplessness, anhedonia, social withdrawal, as well as significant anxiety symptoms related to social avoidance, fear of abandonment and fear of inability to manage and cope with her world. We will also look at continuing complete sobriety.  Last Reviewed:   22   Goals Addressed Today:    Target goals addressed today have to do with issues of depression in particular feelings of helplessness and hopelessness.   Impression/Diagnosis:   The patient has a long history of depressive disorder and underlying anxiety disorder. She has had numerous medical issues including severe fibromyalgia and severe arthritis. She has been diagnosed with significant arthritis throughout her body and in particular her hands and her feet. Numerous events and left are overwhelmed and unable to work. The patient has had periodic times of improvement for primary symptoms but there are other times where the symptoms are so problematic that she has not been able to maintain gainful employment.  Diagnosis:    Axis I:  Mood disorder  Anxiety    Harmonee Tozer R, PsyD 08/13/2014

## 2014-08-14 ENCOUNTER — Encounter (HOSPITAL_COMMUNITY): Payer: Self-pay | Admitting: Psychology

## 2014-08-14 NOTE — Progress Notes (Signed)
       PROGRESS NOTE   Patient:  Lauren [name]   DOB: 1951-08-06  MR Number: [id number]  Location: [facility] [address] [place] [address] Dept: [phone]  Start: 3 PM End: 4 PM  Provider/Observer:     Edgardo Roys PSYD  Chief Complaint:      Chief Complaint  Patient presents with  . Agitation  . Anxiety  . Depression  . Stress  . Trauma    Reason For Service:     The patient has a long history of major depressive condition and ongoing underlying depressive disorder. She is a recovering alcoholic but has many many years this Friday. Significant symptoms of anxiety have played a major role in substance abuse in the past. Significant medical problems and her inability to work have complicated her situation. When all these difficulties are put in the context and relationship to potential workability it is clear that she is disabled not do not expect her to return to gainful employment.  Interventions Strategy:  Cognitive/behavioral psychotherapy  Participation Level:   Active  Participation Quality:  Appropriate      Behavioral Observation:  Well Groomed, Alert, and Appropriate.   Current Psychosocial Factors: The patient reports that she been doing much better over the past few days. She reports that her husband has been helping out a lot relatively speaking and that she has been doing more to take care of herself when interacting with her friends that have potential for manipulating her or her allowing them to take over her life.    Content of Session:   Review current symptoms and continue to work on therapeutic interventions for improved coping skills and strategies.  Current Status:   The patient reports that she has had an acute episode of decompensation recently with the death of her friend's husband from suicide and other psychosocial stressors.  The patient reports  that she has improved from this decompensation over the past week or so.  Patient Progress:   Stable and improving  Target Goals:   Reduce the frequency of depressive events including feelings of hopelessness and helplessness, anhedonia, social withdrawal, as well as significant anxiety symptoms related to social avoidance, fear of abandonment and fear of inability to manage and cope with her world. We will also look at continuing complete sobriety.  Last Reviewed:   07/30/2014   Goals Addressed Today:    Target goals addressed today have to do with issues of depression in particular feelings of helplessness and hopelessness.   Impression/Diagnosis:   The patient has a long history of depressive disorder and underlying anxiety disorder. She has had numerous medical issues including severe fibromyalgia and severe arthritis. She has been diagnosed with significant arthritis throughout her body and in particular her hands and her feet. Numerous events and left are overwhelmed and unable to work. The patient has had periodic times of improvement for primary symptoms but there are other times where the symptoms are so problematic that she has not been able to maintain gainful employment.  Diagnosis:    Axis I:  Mood disorder  Anxiety    RODENBOUGH,JOHN R, PsyD 08/14/2014

## 2014-08-19 ENCOUNTER — Encounter (HOSPITAL_COMMUNITY): Payer: Self-pay | Admitting: Psychiatry

## 2014-08-19 ENCOUNTER — Ambulatory Visit (INDEPENDENT_AMBULATORY_CARE_PROVIDER_SITE_OTHER): Payer: 59 | Admitting: Psychiatry

## 2014-08-19 VITALS — BP 123/87 | HR 86 | Ht 66.0 in | Wt 138.0 lb

## 2014-08-19 DIAGNOSIS — G4700 Insomnia, unspecified: Secondary | ICD-10-CM

## 2014-08-19 DIAGNOSIS — R52 Pain, unspecified: Secondary | ICD-10-CM

## 2014-08-19 DIAGNOSIS — F39 Unspecified mood [affective] disorder: Secondary | ICD-10-CM

## 2014-08-19 DIAGNOSIS — F329 Major depressive disorder, single episode, unspecified: Secondary | ICD-10-CM

## 2014-08-19 DIAGNOSIS — F1021 Alcohol dependence, in remission: Secondary | ICD-10-CM

## 2014-08-19 MED ORDER — TRAZODONE HCL 50 MG PO TABS: 50.0000 mg | ORAL_TABLET | Freq: Every day | ORAL | Status: DC

## 2014-08-19 MED ORDER — VENLAFAXINE HCL ER 75 MG PO CP24: 75.0000 mg | ORAL_CAPSULE | Freq: Every day | ORAL | Status: DC

## 2014-08-19 MED ORDER — LAMOTRIGINE 25 MG PO TABS: 50.0000 mg | ORAL_TABLET | Freq: Two times a day (BID) | ORAL | Status: DC

## 2014-08-19 MED ORDER — LORAZEPAM 1 MG PO TABS: ORAL_TABLET | ORAL | Status: DC

## 2014-08-19 NOTE — Progress Notes (Signed)
Patient ID: ZALEIGH BERMINGHAM, female   DOB: 1951-01-14, 63 y.o.   MRN: [id number] Patient ID: AVERIANA CLOUATRE, female   DOB: [birth year], 63 y.o.   MRN: [id number] Patient ID: ANHAR MCDERMOTT, female   DOB: 1951-06-29, 63 y.o.   MRN: [id number] Patient ID: KARINGTON ZARAZUA, female   DOB: 11/26/1950, 63 y.o.   MRN: [id number] Patient ID: CATHALEEN KOROL, female   DOB: 08-16-1951, 63 y.o.   MRN: [id number] Patient ID: TALYAH SEDER, female   DOB: Sep 08, 1950, 63 y.o.   MRN: [id number] Patient ID: SARIKA BALDINI, female   DOB: March 11, 1951, 63 y.o.   MRN: [id number] Patient ID: JESSLYNN KRUCK, female   DOB: 09-11-1950, 63 y.o.   MRN: [id number] Patient ID: TENEKA MALMBERG, female   DOB: 29-Jul-1951, 63 y.o.   MRN: [id number] Patient ID: OCEAN KEARLEY, female   DOB: [birth year], 63 y.o.   MRN: [id number] Patient ID: ANNISHA BAAR, female   DOB: [birth year], 63 y.o.   MRN: [id number] Patient ID: CHYREL TAHA, female   DOB: [birth year], 63 y.o.   MRN: [id number] [place] [address] Progress Note CARRON MCMURRY MRN: [id number] DOB: 1951/02/18 Age: 63 y.o.  Date: 08/19/2014  Chief Complaint: Chief Complaint  Patient presents with  . Depression  . Manic Behavior  . Follow-up   Subjective: I'm doing better  History of presenting illness This patient is a 62 year old married white female who lives with her husband. She has one stepchild. Her step son killed himself 7 years ago at age 69. He was an alcoholic. 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  The patient has a long history of depression. She was last hospitalized in 2010 after she took too many pain pills and got confused. She use to drink heavily but has been sober 23 years and is very active in [employer] and church. For the most part she feels her mood is stable. She sleeping pretty well. Her energy is good. She has a hard time functioning without Ativan but she only usually  takes one pill a day side think this is reasonable. She tried a higher dose of Cymbalta but it made her manic and revved up  The patient returns after four-week's. She is now on Lamictal 100 mg daily. Her mood is much more even. She's less angry and irritable. She notes that the Percocet she uses for pain make her irritable and I suggested she call her primary doctor about this. She uses a low dose of trazodone along with Ativan to help her sleep and her sleep seems to be  better. She is no longer agitated or having suicidal thoughts Allergies: Allergies  Allergen Reactions  . Elavil [Amitriptyline] Other (See Comments)    Vivid dreams and almost suicidal  . Flagyl [Metronidazole] Itching  . Vistaril [Hydroxyzine Hcl] Other (See Comments)    Got higher than a kite on too much of this.  Lindajo Royal [Ziprasidone Hydrochloride]   . Lactose Intolerance (Gi)   Medical History: Past Medical History  Diagnosis Date  . Depression   . Osteoarthritis   . Fibromyalgia   . IBS (irritable bowel syndrome)   . GERD (gastroesophageal reflux disease)   . Prediabetes   . Chronic pain   Surgical History: Past Surgical History  Procedure Laterality Date  . Tonsillectomy    . Cholecystectomy    . Colonoscopy    .  Upper gastrointestinal endoscopy    . Mandible surgery  09/06/1979  . Orif wrist fracture Right 07/23/2013    Procedure: OPEN REDUCTION INTERNAL FIXATION (ORIF) WRIST FRACTURE;  Surgeon: Roseanne Kaufman, MD;  Location: [facility];  Service: Orthopedics;  Laterality: Right;  Family History family history includes Alcohol abuse in her cousin, father, maternal grandfather, maternal grandmother, paternal grandfather, paternal grandmother, and paternal uncle; Anxiety disorder in her maternal uncle and paternal uncle; Depression in her father; Diabetes in her brother, brother, and father; Healthy in her brother; Heart disease in her brother; Irritable bowel syndrome in her mother; OCD in her brother and  brother; Sexual abuse in her mother; Stroke in her father and mother. There is no history of ADD / ADHD, Bipolar disorder, Dementia, Drug abuse, Paranoid behavior, Schizophrenia, Seizures, or Physical abuse. Reviewed al this again to day in the appointment and nothing has changed  Mental status examination Patient is neatly dressed and groomed.  She she states her mood is fairly good and her affect is bright  She denies paranoia and she denies any hallucination. She denies suicidal and homicidal ideation her speech is clear and understandable, She denies auditory and visual hallucinations or paranoia Her attention and concentration is fair. Her thought process is normal  She's alert and oriented x3 and her insight judgment and impulse control are good  Lab Results:  Results for orders placed or performed during the hospital encounter of 03/18/14 (from the past 8736 hour(s))  Glucose, capillary   Collection Time: 03/18/14 12:08 PM  Result Value Ref Range   Glucose-Capillary 114 (H) 70 - 99 mg/dL  Glucose, capillary   Collection Time: 03/18/14  5:09 PM  Result Value Ref Range   Glucose-Capillary 110 (H) 70 - 99 mg/dL   Comment 1 Notify RN   TSH   Collection Time: 03/19/14  6:30 AM  Result Value Ref Range   TSH 2.400 0.350 - 4.500 uIU/mL  Glucose, capillary   Collection Time: 03/19/14  6:31 AM  Result Value Ref Range   Glucose-Capillary 140 (H) 70 - 99 mg/dL  Glucose, capillary   Collection Time: 03/19/14  4:54 PM  Result Value Ref Range   Glucose-Capillary 120 (H) 70 - 99 mg/dL   Comment 1 Notify RN   Glucose, capillary   Collection Time: 03/20/14  5:59 AM  Result Value Ref Range   Glucose-Capillary 119 (H) 70 - 99 mg/dL  Glucose, capillary   Collection Time: 03/20/14  4:43 PM  Result Value Ref Range   Glucose-Capillary 121 (H) 70 - 99 mg/dL   Comment 1 Notify RN   Glucose, capillary   Collection Time: 03/21/14  6:06 AM  Result Value Ref Range   Glucose-Capillary 135 (H) 70 -  99 mg/dL  Results for orders placed or performed during the hospital encounter of 03/17/14 (from the past 8736 hour(s))  Urine culture   Collection Time: 03/17/14  2:31 PM  Result Value Ref Range   Specimen Description URINE, CLEAN CATCH    Special Requests NONE    Culture  Setup Time      03/17/2014 20:40 Performed at [facility] Performed at [facility]    Culture NO GROWTH Performed at [facility]    Report Status 03/18/2014 FINAL   Urinalysis, Routine w reflex microscopic   Collection Time: 03/17/14  2:31 PM  Result Value Ref Range   Color, Urine YELLOW YELLOW   APPearance CLEAR CLEAR  Specific Gravity, Urine <1.005 (L) 1.005 - 1.030   pH 6.5 5.0 - 8.0   Glucose, UA NEGATIVE NEGATIVE mg/dL   Hgb urine dipstick NEGATIVE NEGATIVE   Bilirubin Urine NEGATIVE NEGATIVE   Ketones, ur NEGATIVE NEGATIVE mg/dL   Protein, ur NEGATIVE NEGATIVE mg/dL   Urobilinogen, UA 0.2 0.0 - 1.0 mg/dL   Nitrite NEGATIVE NEGATIVE   Leukocytes, UA NEGATIVE NEGATIVE  Urine rapid drug screen (hosp performed)   Collection Time: 03/17/14  2:31 PM  Result Value Ref Range   Opiates NONE DETECTED NONE DETECTED   Cocaine NONE DETECTED NONE DETECTED   Benzodiazepines NONE DETECTED NONE DETECTED   Amphetamines NONE DETECTED NONE DETECTED   Tetrahydrocannabinol NONE DETECTED NONE DETECTED   Barbiturates NONE DETECTED NONE DETECTED  CBC with Differential   Collection Time: 03/17/14  2:32 PM  Result Value Ref Range   WBC 6.0 4.0 - 10.5 K/uL   RBC 5.02 3.87 - 5.11 MIL/uL   Hemoglobin 14.1 12.0 - 15.0 g/dL   HCT 42.7 36.0 - 46.0 %   MCV 85.1 78.0 - 100.0 fL   MCH 28.1 26.0 - 34.0 pg   MCHC 33.0 30.0 - 36.0 g/dL   RDW 13.3 11.5 - 15.5 %   Platelets 214 150 - 400 K/uL   Neutrophils Relative % 64 43 - 77 %   Neutro Abs 3.8 1.7 - 7.7 K/uL   Lymphocytes Relative 26 12 - 46 %   Lymphs Abs 1.6 0.7 - 4.0 K/uL   Monocytes Relative 8 3 - 12 %   Monocytes  Absolute 0.5 0.1 - 1.0 K/uL   Eosinophils Relative 2 0 - 5 %   Eosinophils Absolute 0.1 0.0 - 0.7 K/uL   Basophils Relative 0 0 - 1 %   Basophils Absolute 0.0 0.0 - 0.1 K/uL  Basic metabolic panel   Collection Time: 03/17/14  2:32 PM  Result Value Ref Range   Sodium 138 137 - 147 mEq/L   Potassium 4.4 3.7 - 5.3 mEq/L   Chloride 99 96 - 112 mEq/L   CO2 29 19 - 32 mEq/L   Glucose, Bld 122 (H) 70 - 99 mg/dL   BUN 10 6 - 23 mg/dL   Creatinine, Ser 0.55 0.50 - 1.10 mg/dL   Calcium 10.0 8.4 - 10.5 mg/dL   GFR calc non Af Amer >90 >90 mL/min   GFR calc Af Amer >90 >90 mL/min   Anion gap 10 5 - 15  Ethanol   Collection Time: 03/17/14  2:32 PM  Result Value Ref Range   Alcohol, Ethyl (B) <11 0 - 11 mg/dL  POC CBG, ED   Collection Time: 03/18/14  9:07 AM  Result Value Ref Range   Glucose-Capillary 196 (H) 70 - 99 mg/dL  Family doctor is following her labs  Assessment Axis I Maj. depressive disorder, insomnia, pain issues, HIstory of alcoholism. Axis II deferred Axis III see medical history Axis IV mild to moderate  Plan: She she'll continue trazodone and Ativan.she'll continue Lamictal 50 mg twice a day. She'll cut down Effexor XR to 75 mg daily She'll return in 6-weeks  MEDICATIONS this encounter: Meds ordered this encounter  Medications  . DISCONTD: lamoTRIgine (LAMICTAL) 25 MG tablet    Sig: Take 50 mg by mouth 2 (two) times daily.  Marland Kitchen venlafaxine XR (EFFEXOR XR) 75 MG 24 hr capsule    Sig: Take 1 capsule (75 mg total) by mouth daily.    Dispense:  30 capsule  Refill:  2  . lamoTRIgine (LAMICTAL) 25 MG tablet    Sig: Take 2 tablets (50 mg total) by mouth 2 (two) times daily.    Dispense:  120 tablet    Refill:  2  . traZODone (DESYREL) 50 MG tablet    Sig: Take 1 tablet (50 mg total) by mouth at bedtime.    Dispense:  30 tablet    Refill:  2  . LORazepam (ATIVAN) 1 MG tablet    Sig: Take 2 at night    Dispense:  60 tablet    Refill:  2    Medical Decision  Making Problem Points:  Established problem, worsening (2), Review of last therapy session (1) and Review of psycho-social stressors (1) Data Points:  Review or order clinical lab tests (1) Review of medication regiment & side effects (2) Review of new medications or change in dosage (2)  I certify that outpatient services furnished can reasonably be expected to improve the patient's condition.   Levonne Spiller, MD

## 2014-09-01 ENCOUNTER — Ambulatory Visit (HOSPITAL_COMMUNITY): Payer: Self-pay | Admitting: Psychology

## 2014-09-08 DIAGNOSIS — G894 Chronic pain syndrome: Secondary | ICD-10-CM | POA: Diagnosis not present

## 2014-09-08 DIAGNOSIS — E119 Type 2 diabetes mellitus without complications: Secondary | ICD-10-CM | POA: Diagnosis not present

## 2014-09-08 DIAGNOSIS — Z6822 Body mass index (BMI) 22.0-22.9, adult: Secondary | ICD-10-CM | POA: Diagnosis not present

## 2014-09-08 DIAGNOSIS — R110 Nausea: Secondary | ICD-10-CM | POA: Diagnosis not present

## 2014-09-11 ENCOUNTER — Telehealth (INDEPENDENT_AMBULATORY_CARE_PROVIDER_SITE_OTHER): Payer: Self-pay | Admitting: *Deleted

## 2014-09-11 ENCOUNTER — Ambulatory Visit (HOSPITAL_COMMUNITY): Payer: Self-pay | Admitting: Psychology

## 2014-09-11 ENCOUNTER — Other Ambulatory Visit (INDEPENDENT_AMBULATORY_CARE_PROVIDER_SITE_OTHER): Payer: Self-pay | Admitting: *Deleted

## 2014-09-11 ENCOUNTER — Encounter (INDEPENDENT_AMBULATORY_CARE_PROVIDER_SITE_OTHER): Payer: Self-pay | Admitting: Internal Medicine

## 2014-09-11 ENCOUNTER — Ambulatory Visit (INDEPENDENT_AMBULATORY_CARE_PROVIDER_SITE_OTHER): Payer: Medicare Other | Admitting: Internal Medicine

## 2014-09-11 VITALS — BP 100/52 | HR 84 | Temp 97.9°F | Ht 66.0 in | Wt 137.9 lb

## 2014-09-11 DIAGNOSIS — Z1211 Encounter for screening for malignant neoplasm of colon: Secondary | ICD-10-CM

## 2014-09-11 DIAGNOSIS — R110 Nausea: Secondary | ICD-10-CM | POA: Diagnosis not present

## 2014-09-11 DIAGNOSIS — M797 Fibromyalgia: Secondary | ICD-10-CM

## 2014-09-11 MED ORDER — OMEPRAZOLE 40 MG PO CPDR: 40.0000 mg | DELAYED_RELEASE_CAPSULE | Freq: Every day | ORAL | Status: DC

## 2014-09-11 NOTE — Patient Instructions (Addendum)
Rx for Omeprazole 40mg  Daily. H. pylori

## 2014-09-11 NOTE — Progress Notes (Signed)
Subjective:    Patient ID: Lauren [name], female    DOB: 10-15-1950, 64 y.o.   MRN: [id number]  HPI Here today with c/o of nuaea for about 3 weeks.   She tells me she is doing okay. She does tell me she has had some nausea for about 3 weeks. Saw Dr. Gerarda Fraction and placed on Phenergan. The nausea has been almost every day x 3 weeks. She wakes up in the morning as is nauseated. She denies prior hx of nausea.  Started Lamitical about 6 weeks ago for mood disorder. Appetite is good. No weight loss.  No abdominal pain. She has normal BMs. No melena or BRRB.  She does occasionally have acid reflux. Hx of Hepatitis C diagnosed in 2001. Liver biopsy in 2001 and 2003.  She underwent a Fibrosure test and came back abnormal suggesting that she had significant fibrosis. Liver biopsy March 2012: Grade 1, Stage 0.     Her last colonoscopy was in May of 2005 by Dr. Laural Golden: patient tolerated the procedure well. FINAL DIAGNOSIS: Tortuous but normal colon.  CBC    Component Value Date/Time   WBC 6.0 03/17/2014 1432   RBC 5.02 03/17/2014 1432   HGB 14.1 03/17/2014 1432   HCT 42.7 03/17/2014 1432   PLT 214 03/17/2014 1432   MCV 85.1 03/17/2014 1432   MCH 28.1 03/17/2014 1432   MCHC 33.0 03/17/2014 1432   RDW 13.3 03/17/2014 1432   LYMPHSABS 1.6 03/17/2014 1432   MONOABS 0.5 03/17/2014 1432   EOSABS 0.1 03/17/2014 1432   BASOSABS 0.0 03/17/2014 1432      Review of Systems Past Medical History  Diagnosis Date  . Depression   . Osteoarthritis   . Fibromyalgia   . IBS (irritable bowel syndrome)   . GERD (gastroesophageal reflux disease)   . Prediabetes   . Chronic pain   . Fibromyalgia     Past Surgical History  Procedure Laterality Date  . Tonsillectomy    . Cholecystectomy    . Colonoscopy    . Upper gastrointestinal endoscopy    . Mandible surgery  09/06/1979  . Orif wrist fracture Right 07/23/2013    Procedure: OPEN REDUCTION INTERNAL FIXATION (ORIF) WRIST FRACTURE;  Surgeon:  Roseanne Kaufman, MD;  Location: [facility];  Service: Orthopedics;  Laterality: Right;    Allergies  Allergen Reactions  . Elavil [Amitriptyline] Other (See Comments)    Vivid dreams and almost suicidal  . Flagyl [Metronidazole] Itching  . Vistaril [Hydroxyzine Hcl] Other (See Comments)    Got higher than a kite on too much of this.  Lindajo Royal [Ziprasidone Hydrochloride]   . Lactose Intolerance (Gi)     Current Outpatient Prescriptions on File Prior to Visit  Medication Sig Dispense Refill  . albuterol (PROAIR HFA) 108 (90 BASE) MCG/ACT inhaler Inhale 2 puffs into the lungs every 4 (four) hours as needed for wheezing or shortness of breath.    . calcium-vitamin D (OSCAL WITH D) 500-200 MG-UNIT per tablet Take 1 tablet by mouth 2 (two) times daily. For low calcium    . diphenoxylate-atropine (LOMOTIL) 2.5-0.025 MG per tablet Take 1 tablet by mouth 4 (four) times daily as needed for diarrhea or loose stools. 30 tablet 0  . lamoTRIgine (LAMICTAL) 25 MG tablet Take 2 tablets (50 mg total) by mouth 2 (two) times daily. 120 tablet 2  . LORazepam (ATIVAN) 1 MG tablet Take 2 at night (Patient taking differently: As needed) 60 tablet 2  . methocarbamol (ROBAXIN)  500 MG tablet Take 1 tablet (500 mg total) by mouth at bedtime. Muscle pain (Patient taking differently: Take 500 mg by mouth as needed. Muscle pain)    . Oxycodone-Acetaminophen (PERCOCET PO) Take by mouth as needed.    . pravastatin (PRAVACHOL) 20 MG tablet Take 1 tablet (20 mg total) by mouth daily. For high cholesterol 90 tablet 0  . traZODone (DESYREL) 50 MG tablet Take 1 tablet (50 mg total) by mouth at bedtime. 30 tablet 2  . venlafaxine XR (EFFEXOR XR) 75 MG 24 hr capsule Take 1 capsule (75 mg total) by mouth daily. 30 capsule 2   No current facility-administered medications on file prior to visit.        Objective:   Physical Exam  Filed Vitals:   09/11/14 1543  Height: 5\' 6"  (1.676 m)  Weight: 137 lb 14.4 oz (62.551 kg)    Alert and oriented. Skin warm and dry. Oral mucosa is moist.   . Sclera anicteric, conjunctivae is pink. Thyroid not enlarged. No cervical lymphadenopathy. Lungs clear. Heart regular rate and rhythm.  Abdomen is soft. Bowel sounds are positive. No hepatomegaly. No abdominal masses felt. No tenderness.  No edema to lower extremities.       Assessment & Plan:  Nausea x 3 weeks. Will try her on Omeprazole 40mg  daily 30 minutes before breakfast. Will get an H. Pylori on her. Will schedule a colonoscopy.  Hepatitis C: She is interested in treatment and this will be addressed after her colonosocopy.

## 2014-09-11 NOTE — Telephone Encounter (Signed)
Patient needs movi prep 

## 2014-09-12 LAB — H. PYLORI ANTIBODY, IGG: H Pylori IgG: 0.52 {ISR}

## 2014-09-12 MED ORDER — PEG-KCL-NACL-NASULF-NA ASC-C 100 G PO SOLR: 1.0000 | Freq: Once | ORAL | Status: DC

## 2014-09-17 ENCOUNTER — Other Ambulatory Visit (INDEPENDENT_AMBULATORY_CARE_PROVIDER_SITE_OTHER): Payer: Self-pay | Admitting: *Deleted

## 2014-09-17 DIAGNOSIS — R110 Nausea: Secondary | ICD-10-CM

## 2014-09-17 DIAGNOSIS — Z1211 Encounter for screening for malignant neoplasm of colon: Secondary | ICD-10-CM

## 2014-09-23 DIAGNOSIS — Z01419 Encounter for gynecological examination (general) (routine) without abnormal findings: Secondary | ICD-10-CM | POA: Diagnosis not present

## 2014-09-23 DIAGNOSIS — Z6821 Body mass index (BMI) 21.0-21.9, adult: Secondary | ICD-10-CM | POA: Diagnosis not present

## 2014-09-24 DIAGNOSIS — Z01419 Encounter for gynecological examination (general) (routine) without abnormal findings: Secondary | ICD-10-CM | POA: Diagnosis not present

## 2014-09-30 ENCOUNTER — Ambulatory Visit (HOSPITAL_COMMUNITY): Payer: Self-pay | Admitting: Psychiatry

## 2014-10-01 ENCOUNTER — Ambulatory Visit (INDEPENDENT_AMBULATORY_CARE_PROVIDER_SITE_OTHER): Payer: 59 | Admitting: Psychiatry

## 2014-10-01 ENCOUNTER — Encounter (HOSPITAL_COMMUNITY): Payer: Self-pay | Admitting: Psychiatry

## 2014-10-01 VITALS — BP 119/66 | HR 83 | Ht 66.0 in | Wt 139.8 lb

## 2014-10-01 DIAGNOSIS — F329 Major depressive disorder, single episode, unspecified: Secondary | ICD-10-CM

## 2014-10-01 DIAGNOSIS — G4700 Insomnia, unspecified: Secondary | ICD-10-CM

## 2014-10-01 DIAGNOSIS — F39 Unspecified mood [affective] disorder: Secondary | ICD-10-CM

## 2014-10-01 DIAGNOSIS — R52 Pain, unspecified: Secondary | ICD-10-CM

## 2014-10-01 DIAGNOSIS — F419 Anxiety disorder, unspecified: Secondary | ICD-10-CM

## 2014-10-01 MED ORDER — LAMOTRIGINE 25 MG PO TABS: 50.0000 mg | ORAL_TABLET | Freq: Two times a day (BID) | ORAL | Status: DC

## 2014-10-01 MED ORDER — TRAZODONE HCL 50 MG PO TABS: 50.0000 mg | ORAL_TABLET | Freq: Every day | ORAL | Status: DC

## 2014-10-01 MED ORDER — LORAZEPAM 1 MG PO TABS: ORAL_TABLET | ORAL | Status: DC

## 2014-10-01 MED ORDER — VENLAFAXINE HCL ER 75 MG PO CP24: 75.0000 mg | ORAL_CAPSULE | Freq: Every day | ORAL | Status: DC

## 2014-10-01 NOTE — Progress Notes (Signed)
Patient ID: Lauren [name], female   DOB: [birth year], 64 y.o.   MRN: [id number] Patient ID: Lauren [name], female   DOB: Jul 12, 1951, 64 y.o.   MRN: [id number] Patient ID: Lauren [name], female   DOB: 1951-05-27, 64 y.o.   MRN: [id number] Patient ID: Lauren [name], female   DOB: Sep 06, 1950, 64 y.o.   MRN: [id number] Patient ID: Lauren [name], female   DOB: Jun 30, 1951, 64 y.o.   MRN: [id number] Patient ID: Lauren [name], female   DOB: August 29, 1951, 64 y.o.   MRN: [id number] Patient ID: Lauren [name], female   DOB: October 22, 1950, 64 y.o.   MRN: [id number] Patient ID: Lauren [name], female   DOB: 1951-05-04, 64 y.o.   MRN: [id number] Patient ID: Lauren [name], female   DOB: 04/16/1951, 64 y.o.   MRN: [id number] Patient ID: Lauren [name], female   DOB: 1950-12-03, 64 y.o.   MRN: [id number] Patient ID: Lauren [name], female   DOB: November 08, 1950, 64 y.o.   MRN: [id number] Patient ID: Lauren [name], female   DOB: 02-07-1951, 64 y.o.   MRN: [id number] Patient ID: Lauren [name], female   DOB: [birth year], 64 y.o.   MRN: [id number] [place] [address] Progress Note Lauren [name] MRN: [id number] DOB: 1951/01/13 Age: 64 y.o.  Date: 10/01/2014  Chief Complaint: Chief Complaint  Patient presents with  . Depression  . Manic Behavior  . Follow-up   Subjective: I'm doing better  History of presenting illness This patient is a 64 year old married white female who lives with her husband. She has one stepchild. Her step son killed himself 7 years ago at age 12. He was an alcoholic. 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  The patient has a long history of depression. She was last hospitalized in 2010 after she took too many pain pills and got confused. She use to drink heavily but has been sober 23 years and is very active in [employer] and church. For the most part she feels her mood is stable. She sleeping pretty well. Her energy  is good. She has a hard time functioning without Ativan but she only usually takes one pill a day side think this is reasonable. She tried a higher dose of Cymbalta but it made her manic and revved up  The patient returns after 2 months. In general she is doing better. She had a rough time around the holidays because her stepdaughter didn't want to bring the grandchildren over for Christmas. She keeps blaming herself for this. Overall however her mood is improved. Sometimes she forgets to take the Effexor and she notices she gets upset and irritable again. She is using the Ativan only at night. She's been having a lot of nausea and is now scheduled for an endoscopy and colonoscopy. She has a history of irritable bowel syndrome Allergies: Allergies  Allergen Reactions  . Elavil [Amitriptyline] Other (See Comments)    Vivid dreams and almost suicidal  . Flagyl [Metronidazole] Itching  . Vistaril [Hydroxyzine Hcl] Other (See Comments)    Got higher than a kite on too much of this.  Lindajo Royal [Ziprasidone Hydrochloride]   . Lactose Intolerance (Gi)   Medical History: Past Medical History  Diagnosis Date  . Depression   . Osteoarthritis   . Fibromyalgia   . IBS (irritable bowel syndrome)   . GERD (gastroesophageal reflux disease)   . Prediabetes   .  Chronic pain   . Fibromyalgia   Surgical History: Past Surgical History  Procedure Laterality Date  . Tonsillectomy    . Cholecystectomy    . Colonoscopy    . Upper gastrointestinal endoscopy    . Mandible surgery  09/06/1979  . Orif wrist fracture Right 07/23/2013    Procedure: OPEN REDUCTION INTERNAL FIXATION (ORIF) WRIST FRACTURE;  Surgeon: Roseanne Kaufman, MD;  Location: [facility];  Service: Orthopedics;  Laterality: Right;  Family History family history includes Alcohol abuse in her cousin, father, maternal grandfather, maternal grandmother, paternal grandfather, paternal grandmother, and paternal uncle; Anxiety disorder in her maternal uncle  and paternal uncle; Depression in her father; Diabetes in her brother, brother, and father; Healthy in her brother; Heart disease in her brother; Irritable bowel syndrome in her mother; OCD in her brother and brother; Sexual abuse in her mother; Stroke in her father and mother. There is no history of ADD / ADHD, Bipolar disorder, Dementia, Drug abuse, Paranoid behavior, Schizophrenia, Seizures, or Physical abuse. Reviewed al this again to day in the appointment and nothing has changed  Mental status examination Patient is neatly dressed and groomed.  She she states her mood is fairly good and her affect is bright  She denies paranoia and she denies any hallucination. She denies suicidal and homicidal ideation her speech is clear and understandable, she no longer has sped up speech or agitation She denies auditory and visual hallucinations or paranoia Her attention and concentration is fair. Her thought process is normal  She's alert and oriented x3 and her insight judgment and impulse control are good  Lab Results:  Results for orders placed or performed in visit on 09/11/14 (from the past 8736 hour(s))  H. pylori antibody, IgG   Collection Time: 09/11/14  4:16 PM  Result Value Ref Range   H Pylori IgG 0.52 ISR  Results for orders placed or performed during the hospital encounter of 03/18/14 (from the past 8736 hour(s))  Glucose, capillary   Collection Time: 03/18/14 12:08 PM  Result Value Ref Range   Glucose-Capillary 114 (H) 70 - 99 mg/dL  Glucose, capillary   Collection Time: 03/18/14  5:09 PM  Result Value Ref Range   Glucose-Capillary 110 (H) 70 - 99 mg/dL   Comment 1 Notify RN   TSH   Collection Time: 03/19/14  6:30 AM  Result Value Ref Range   TSH 2.400 0.350 - 4.500 uIU/mL  Glucose, capillary   Collection Time: 03/19/14  6:31 AM  Result Value Ref Range   Glucose-Capillary 140 (H) 70 - 99 mg/dL  Glucose, capillary   Collection Time: 03/19/14  4:54 PM  Result Value Ref Range    Glucose-Capillary 120 (H) 70 - 99 mg/dL   Comment 1 Notify RN   Glucose, capillary   Collection Time: 03/20/14  5:59 AM  Result Value Ref Range   Glucose-Capillary 119 (H) 70 - 99 mg/dL  Glucose, capillary   Collection Time: 03/20/14  4:43 PM  Result Value Ref Range   Glucose-Capillary 121 (H) 70 - 99 mg/dL   Comment 1 Notify RN   Glucose, capillary   Collection Time: 03/21/14  6:06 AM  Result Value Ref Range   Glucose-Capillary 135 (H) 70 - 99 mg/dL  Results for orders placed or performed during the hospital encounter of 03/17/14 (from the past 8736 hour(s))  Urine culture   Collection Time: 03/17/14  2:31 PM  Result Value Ref Range   Specimen Description URINE, [place]  Special Requests NONE    Culture  Setup Time      03/17/2014 20:40 Performed at [facility] Performed at [facility]    Culture NO GROWTH Performed at [facility]    Report Status 03/18/2014 FINAL   Urinalysis, Routine w reflex microscopic   Collection Time: 03/17/14  2:31 PM  Result Value Ref Range   Color, Urine YELLOW YELLOW   APPearance CLEAR CLEAR   Specific Gravity, Urine <1.005 (L) 1.005 - 1.030   pH 6.5 5.0 - 8.0   Glucose, UA NEGATIVE NEGATIVE mg/dL   Hgb urine dipstick NEGATIVE NEGATIVE   Bilirubin Urine NEGATIVE NEGATIVE   Ketones, ur NEGATIVE NEGATIVE mg/dL   Protein, ur NEGATIVE NEGATIVE mg/dL   Urobilinogen, UA 0.2 0.0 - 1.0 mg/dL   Nitrite NEGATIVE NEGATIVE   Leukocytes, UA NEGATIVE NEGATIVE  Urine rapid drug screen (hosp performed)   Collection Time: 03/17/14  2:31 PM  Result Value Ref Range   Opiates NONE DETECTED NONE DETECTED   Cocaine NONE DETECTED NONE DETECTED   Benzodiazepines NONE DETECTED NONE DETECTED   Amphetamines NONE DETECTED NONE DETECTED   Tetrahydrocannabinol NONE DETECTED NONE DETECTED   Barbiturates NONE DETECTED NONE DETECTED  CBC with Differential   Collection Time: 03/17/14  2:32 PM  Result Value Ref  Range   WBC 6.0 4.0 - 10.5 K/uL   RBC 5.02 3.87 - 5.11 MIL/uL   Hemoglobin 14.1 12.0 - 15.0 g/dL   HCT 42.7 36.0 - 46.0 %   MCV 85.1 78.0 - 100.0 fL   MCH 28.1 26.0 - 34.0 pg   MCHC 33.0 30.0 - 36.0 g/dL   RDW 13.3 11.5 - 15.5 %   Platelets 214 150 - 400 K/uL   Neutrophils Relative % 64 43 - 77 %   Neutro Abs 3.8 1.7 - 7.7 K/uL   Lymphocytes Relative 26 12 - 46 %   Lymphs Abs 1.6 0.7 - 4.0 K/uL   Monocytes Relative 8 3 - 12 %   Monocytes Absolute 0.5 0.1 - 1.0 K/uL   Eosinophils Relative 2 0 - 5 %   Eosinophils Absolute 0.1 0.0 - 0.7 K/uL   Basophils Relative 0 0 - 1 %   Basophils Absolute 0.0 0.0 - 0.1 K/uL  Basic metabolic panel   Collection Time: 03/17/14  2:32 PM  Result Value Ref Range   Sodium 138 137 - 147 mEq/L   Potassium 4.4 3.7 - 5.3 mEq/L   Chloride 99 96 - 112 mEq/L   CO2 29 19 - 32 mEq/L   Glucose, Bld 122 (H) 70 - 99 mg/dL   BUN 10 6 - 23 mg/dL   Creatinine, Ser 0.55 0.50 - 1.10 mg/dL   Calcium 10.0 8.4 - 10.5 mg/dL   GFR calc non Af Amer >90 >90 mL/min   GFR calc Af Amer >90 >90 mL/min   Anion gap 10 5 - 15  Ethanol   Collection Time: 03/17/14  2:32 PM  Result Value Ref Range   Alcohol, Ethyl (B) <11 0 - 11 mg/dL  POC CBG, ED   Collection Time: 03/18/14  9:07 AM  Result Value Ref Range   Glucose-Capillary 196 (H) 70 - 99 mg/dL  Family doctor is following her labs  Assessment Axis I Maj. depressive disorder, insomnia, pain issues, HIstory of alcoholism. Axis II deferred Axis III see medical history Axis IV mild to moderate  Plan: She she'll continue trazodone and Ativan.she'll continue  Lamictal 50 mg twice a day. She'll cut down Effexor XR to 75 mg daily She'll return in 2 months  MEDICATIONS this encounter: Meds ordered this encounter  Medications  . lamoTRIgine (LAMICTAL) 25 MG tablet    Sig: Take 2 tablets (50 mg total) by mouth 2 (two) times daily.    Dispense:  120 tablet    Refill:  2  . traZODone (DESYREL) 50 MG tablet    Sig: Take 1  tablet (50 mg total) by mouth at bedtime.    Dispense:  30 tablet    Refill:  2  . venlafaxine XR (EFFEXOR XR) 75 MG 24 hr capsule    Sig: Take 1 capsule (75 mg total) by mouth daily.    Dispense:  30 capsule    Refill:  2  . LORazepam (ATIVAN) 1 MG tablet    Sig: Take 2 at night    Dispense:  60 tablet    Refill:  2    Medical Decision Making Problem Points:  Established problem, worsening (2), Review of last therapy session (1) and Review of psycho-social stressors (1) Data Points:  Review or order clinical lab tests (1) Review of medication regiment & side effects (2) Review of new medications or change in dosage (2)  I certify that outpatient services furnished can reasonably be expected to improve the patient's condition.   Levonne Spiller, MD

## 2014-10-02 ENCOUNTER — Ambulatory Visit (HOSPITAL_COMMUNITY): Payer: 59 | Admitting: Psychology

## 2014-10-21 ENCOUNTER — Other Ambulatory Visit (HOSPITAL_COMMUNITY): Payer: Self-pay | Admitting: Internal Medicine

## 2014-10-21 DIAGNOSIS — L03032 Cellulitis of left toe: Secondary | ICD-10-CM | POA: Diagnosis not present

## 2014-10-21 DIAGNOSIS — R5383 Other fatigue: Secondary | ICD-10-CM | POA: Diagnosis not present

## 2014-10-21 DIAGNOSIS — G894 Chronic pain syndrome: Secondary | ICD-10-CM | POA: Diagnosis not present

## 2014-10-21 DIAGNOSIS — M810 Age-related osteoporosis without current pathological fracture: Secondary | ICD-10-CM

## 2014-10-21 DIAGNOSIS — F419 Anxiety disorder, unspecified: Secondary | ICD-10-CM | POA: Diagnosis not present

## 2014-10-23 ENCOUNTER — Encounter (HOSPITAL_COMMUNITY): Payer: Self-pay | Admitting: *Deleted

## 2014-10-23 ENCOUNTER — Ambulatory Visit: Admit: 2014-10-23 | Payer: Self-pay | Admitting: Internal Medicine

## 2014-10-23 ENCOUNTER — Ambulatory Visit (HOSPITAL_COMMUNITY)
Admission: RE | Admit: 2014-10-23 | Discharge: 2014-10-23 | Disposition: A | Discharge status: Home / Self Care | Payer: Medicare Other | Source: Ambulatory Visit | Attending: Internal Medicine | Admitting: Internal Medicine

## 2014-10-23 ENCOUNTER — Encounter (HOSPITAL_COMMUNITY)
Admission: RE | Disposition: A | Discharge status: Home / Self Care | Payer: Self-pay | Source: Ambulatory Visit | Attending: Internal Medicine

## 2014-10-23 DIAGNOSIS — M797 Fibromyalgia: Secondary | ICD-10-CM | POA: Insufficient documentation

## 2014-10-23 DIAGNOSIS — K648 Other hemorrhoids: Secondary | ICD-10-CM | POA: Diagnosis not present

## 2014-10-23 DIAGNOSIS — Z79899 Other long term (current) drug therapy: Secondary | ICD-10-CM | POA: Diagnosis not present

## 2014-10-23 DIAGNOSIS — K219 Gastro-esophageal reflux disease without esophagitis: Secondary | ICD-10-CM | POA: Insufficient documentation

## 2014-10-23 DIAGNOSIS — F329 Major depressive disorder, single episode, unspecified: Secondary | ICD-10-CM | POA: Diagnosis not present

## 2014-10-23 DIAGNOSIS — Z87891 Personal history of nicotine dependence: Secondary | ICD-10-CM | POA: Diagnosis not present

## 2014-10-23 DIAGNOSIS — R630 Anorexia: Secondary | ICD-10-CM | POA: Diagnosis not present

## 2014-10-23 DIAGNOSIS — K210 Gastro-esophageal reflux disease with esophagitis: Secondary | ICD-10-CM

## 2014-10-23 DIAGNOSIS — R634 Abnormal weight loss: Secondary | ICD-10-CM | POA: Diagnosis not present

## 2014-10-23 DIAGNOSIS — Z7982 Long term (current) use of aspirin: Secondary | ICD-10-CM | POA: Diagnosis not present

## 2014-10-23 DIAGNOSIS — R110 Nausea: Secondary | ICD-10-CM

## 2014-10-23 DIAGNOSIS — K2270 Barrett's esophagus without dysplasia: Secondary | ICD-10-CM | POA: Insufficient documentation

## 2014-10-23 DIAGNOSIS — Z1211 Encounter for screening for malignant neoplasm of colon: Secondary | ICD-10-CM | POA: Diagnosis not present

## 2014-10-23 DIAGNOSIS — K644 Residual hemorrhoidal skin tags: Secondary | ICD-10-CM | POA: Diagnosis not present

## 2014-10-23 HISTORY — PX: COLONOSCOPY: SHX5424

## 2014-10-23 HISTORY — PX: ESOPHAGOGASTRODUODENOSCOPY: SHX5428

## 2014-10-23 SURGERY — COLONOSCOPY
Anesthesia: Moderate Sedation

## 2014-10-23 MED ORDER — SODIUM CHLORIDE 0.9 % IV SOLN
INTRAVENOUS | Status: DC
  Administered 2014-10-23: 1000 mL via INTRAVENOUS

## 2014-10-23 MED ORDER — MIDAZOLAM HCL 5 MG/5ML IJ SOLN
INTRAMUSCULAR | Status: DC | PRN
  Administered 2014-10-23 (×4): 2 mg via INTRAVENOUS
  Administered 2014-10-23: 1 mg via INTRAVENOUS
  Administered 2014-10-23: 3 mg via INTRAVENOUS
  Administered 2014-10-23: 2 mg via INTRAVENOUS

## 2014-10-23 MED ORDER — BUTAMBEN-TETRACAINE-BENZOCAINE 2-2-14 % EX AERO
INHALATION_SPRAY | CUTANEOUS | Status: DC | PRN
  Administered 2014-10-23: 2 via TOPICAL

## 2014-10-23 MED ORDER — MEPERIDINE HCL 50 MG/ML IJ SOLN
INTRAMUSCULAR | Status: AC
  Filled 2014-10-23: qty 1

## 2014-10-23 MED ORDER — STERILE WATER FOR IRRIGATION IR SOLN
Status: DC | PRN
  Administered 2014-10-23: 11:00:00

## 2014-10-23 MED ORDER — MIDAZOLAM HCL 5 MG/5ML IJ SOLN
INTRAMUSCULAR | Status: AC
  Filled 2014-10-23: qty 10

## 2014-10-23 MED ORDER — MEPERIDINE HCL 50 MG/ML IJ SOLN
INTRAMUSCULAR | Status: DC | PRN
Start: 2014-10-23 — End: 2014-10-23
  Administered 2014-10-23 (×2): 25 mg via INTRAVENOUS

## 2014-10-23 MED ORDER — HYOSCYAMINE SULFATE 0.125 MG SL SUBL: 0.1250 mg | SUBLINGUAL_TABLET | Freq: Three times a day (TID) | SUBLINGUAL | Status: DC | PRN

## 2014-10-23 MED ORDER — MIDAZOLAM HCL 5 MG/5ML IJ SOLN
INTRAMUSCULAR | Status: AC
  Filled 2014-10-23: qty 5

## 2014-10-23 NOTE — Op Note (Signed)
EGD PROCEDURE REPORT  PATIENT:  Lauren [name]  MR#:  [id number] Birthdate:  1950/10/12, 64 y.o., female Endoscopist:  Dr. Rogene Houston, MD Referred By:  Dr. Glo Herring, MD  Procedure Date: 10/23/2014  Procedure:   EGD & Colonoscopy  Indications: Patient is 64 year old Caucasian female with several year history of GERD who presents with intermittent postprandial nausea. She also has noted drop in her appetite and 5 pound weight loss in 4 weeks. She is undergoing diagnostic EGD followed by average risk screening colonoscopy. Last colonoscopy was in 2005.            Informed Consent:  The risks, benefits, alternatives & imponderables which include, but are not limited to, bleeding, infection, perforation, drug reaction and potential missed lesion have been reviewed.  The potential for biopsy, lesion removal, esophageal dilation, etc. have also been discussed.  Questions have been answered.  All parties agreeable.  Please see history & physical in medical record for more information.  Medications:  Demerol 50 mg IV Versed 14 mg IV Cetacaine spray topically for oropharyngeal anesthesia  EGD  Description of procedure:  The endoscope was introduced through the mouth and advanced to the second portion of the duodenum without difficulty or limitations. The mucosal surfaces were surveyed very carefully during advancement of the scope and upon withdrawal.  Findings:  Esophagus:  Mucosa of the esophagus was normal. GE junction was serrated or wavy with focal erythema. GEJ:  37 cm Stomach:  Stomach was empty and distended very well with insufflation. Folds in the proximal stomach were normal. Examination of mucosa at body, antrum, pyloric channel, angularis, fundus and cardia was normal. Duodenum:  Normal bulbar and post bulbar mucosa.  Therapeutic/Diagnostic Maneuvers Performed:  Biopsy taken from GE junction.  COLONOSCOPY Description of procedure:  After a digital rectal exam was  performed, that colonoscope was advanced from the anus through the rectum and colon to the area of the cecum, ileocecal valve and appendiceal orifice. The cecum was deeply intubated. These structures were well-seen and photographed for the record. From the level of the cecum and ileocecal valve, the scope was slowly and cautiously withdrawn. The mucosal surfaces were carefully surveyed utilizing scope tip to flexion to facilitate fold flattening as needed. The scope was pulled down into the rectum where a thorough exam including retroflexion was performed.  Findings:   Prep satisfactory. Normal mucosa of cecum, ascending colon, hepatic flexure, transverse colon, splenic flexure, descending and sigmoid colon. Normal rectal mucosa. Small hemorrhoids below the dentate line.  Therapeutic/Diagnostic Maneuvers Performed:  None  Complications:  None  Cecal Withdrawal Time:  8 minutes  Impression:  EGD findings; Serrated gastroesophageal junction with focal erythema consistent with mild changes of reflux esophagitis. Biopsy taken to rule out short segment Barrett's. No evidence of peptic ulcer disease or pyloric stenosis.  Colonoscopy findings; Small external hemorrhoids otherwise normal colonoscopy.  Recommendations:  Standard instructions given. Levsin sublingual 1 tablet 3 times a day when necessary. I will be contacting patient with biopsy results and further recommendations.  Kirby Cortese U  10/23/2014 11:40 AM  CC: Dr. Glo Herring., MD & Dr. Rayne Du ref. provider found

## 2014-10-23 NOTE — Discharge Instructions (Signed)
Resume usual medications and diet. Hyoscyamine sublingual 1 tablet up to 3 times a day as needed. No driving for 24 hours. Physician will call with biopsy results.  Colonoscopy, Care After Refer to this sheet in the next few weeks. These instructions provide you with information on caring for yourself after your procedure. Your health care provider may also give you more specific instructions. Your treatment has been planned according to current medical practices, but problems sometimes occur. Call your health care provider if you have any problems or questions after your procedure. WHAT TO EXPECT AFTER THE PROCEDURE  After your procedure, it is typical to have the following:  A small amount of blood in your stool.  Moderate amounts of gas and mild abdominal cramping or bloating. HOME CARE INSTRUCTIONS  Do not drive, operate machinery, or sign important documents for 24 hours.  You may shower and resume your regular physical activities, but move at a slower pace for the first 24 hours.  Take frequent rest periods for the first 24 hours.  Walk around or put a warm pack on your abdomen to help reduce abdominal cramping and bloating.  Drink enough fluids to keep your urine clear or pale yellow.  You may resume your normal diet as instructed by your health care provider. Avoid heavy or fried foods that are hard to digest.  Avoid drinking alcohol for 24 hours or as instructed by your health care provider.  Only take over-the-counter or prescription medicines as directed by your health care provider.  If a tissue sample (biopsy) was taken during your procedure:  Do not take aspirin or blood thinners for 7 days, or as instructed by your health care provider.  Do not drink alcohol for 7 days, or as instructed by your health care provider.  Eat soft foods for the first 24 hours. SEEK MEDICAL CARE IF: You have persistent spotting of blood in your stool 2-3 days after the procedure. SEEK  IMMEDIATE MEDICAL CARE IF:  You have more than a small spotting of blood in your stool.  You pass large blood clots in your stool.  Your abdomen is swollen (distended).  You have nausea or vomiting.  You have a fever.  You have increasing abdominal pain that is not relieved with medicine. Document Released: 04/05/2004 Document Revised: 06/12/2013 Document Reviewed: 04/29/2013 [facility] Patient Information 2015 [place], [place]. This information is not intended to replace advice given to you by your health care provider. Make sure you discuss any questions you have with your health care provider.

## 2014-10-23 NOTE — H&P (Signed)
Lauren [name] is an 64 y.o. female.   Chief Complaint: Patient is here for EGD and colonoscopy. HPI: Patient is 64 year old Caucasian female who presents with 2 month history of nausea anorexia 5 pound weight loss. She has chronic GERD and feels heartburns well controlled with therapy. She said symptoms of GERD for close to 20 years. Her upper GI tract has never been examined. She says stools been black at times she denies rectal bleeding or diarrhea. Last colonoscopy was in 2005.  Past Medical History  Diagnosis Date  . Depression   . Osteoarthritis   . Fibromyalgia   . IBS (irritable bowel syndrome)   . GERD (gastroesophageal reflux disease)   . Prediabetes   . Chronic pain   . Fibromyalgia     Past Surgical History  Procedure Laterality Date  . Tonsillectomy    . Cholecystectomy    . Colonoscopy    . Upper gastrointestinal endoscopy    . Mandible surgery  09/06/1979  . Orif wrist fracture Right 07/23/2013    Procedure: OPEN REDUCTION INTERNAL FIXATION (ORIF) WRIST FRACTURE;  Surgeon: Lauren Kaufman, MD;  Location: [facility];  Service: Orthopedics;  Laterality: Right;    Family History  Problem Relation Age of Onset  . Alcohol abuse Father   . Diabetes Father   . Stroke Father   . Depression Father   . Anxiety disorder Paternal Uncle   . Alcohol abuse Paternal Uncle   . Alcohol abuse Maternal Grandfather   . Alcohol abuse Maternal Grandmother   . Alcohol abuse Paternal Grandfather   . Alcohol abuse Paternal Grandmother   . Alcohol abuse Cousin   . Anxiety disorder Maternal Uncle   . Stroke Mother   . Irritable bowel syndrome Mother   . Sexual abuse Mother   . Diabetes Brother   . OCD Brother   . Healthy Brother   . OCD Brother   . Diabetes Brother   . Heart disease Brother   . ADD / ADHD Neg Hx   . Bipolar disorder Neg Hx   . Dementia Neg Hx   . Drug abuse Neg Hx   . Paranoid behavior Neg Hx   . Schizophrenia Neg Hx   . Seizures Neg Hx   . Physical abuse Neg  Hx    Social History:  reports that she quit smoking about 31 years ago. She has never used smokeless tobacco. She reports that she does not drink alcohol or use illicit drugs.  Allergies:  Allergies  Allergen Reactions  . Elavil [Amitriptyline] Other (See Comments)    Vivid dreams and almost suicidal  . Flagyl [Metronidazole] Itching  . Vistaril [Hydroxyzine Hcl] Other (See Comments)    Got higher than a kite on too much of this.  Lindajo Royal [Ziprasidone Hydrochloride]   . Lactose Intolerance (Gi)     Medications Prior to Admission  Medication Sig Dispense Refill  . aspirin 81 MG tablet Take 81 mg by mouth daily.    . calcium-vitamin D (OSCAL WITH D) 500-200 MG-UNIT per tablet Take 1 tablet by mouth 2 (two) times daily. For low calcium    . diphenoxylate-atropine (LOMOTIL) 2.5-0.025 MG per tablet Take 1 tablet by mouth 4 (four) times daily as needed for diarrhea or loose stools. 30 tablet 0  . lamoTRIgine (LAMICTAL) 25 MG tablet Take 2 tablets (50 mg total) by mouth 2 (two) times daily. 120 tablet 2  . LORazepam (ATIVAN) 1 MG tablet Take 2 at night (Patient taking differently: Take  2 mg by mouth at bedtime. Take 2 at night) 60 tablet 2  . methocarbamol (ROBAXIN) 500 MG tablet Take 1 tablet (500 mg total) by mouth at bedtime. Muscle pain (Patient taking differently: Take 500 mg by mouth as needed. Muscle pain)    . omeprazole (PRILOSEC) 40 MG capsule Take 1 capsule (40 mg total) by mouth daily. 90 capsule 3  . oxyCODONE-acetaminophen (PERCOCET) 10-325 MG per tablet Take 1 tablet by mouth 4 (four) times daily.  0  . peg 3350 powder (MOVIPREP) 100 G SOLR Take 1 kit (200 g total) by mouth once. 1 kit 0  . pravastatin (PRAVACHOL) 20 MG tablet Take 1 tablet (20 mg total) by mouth daily. For high cholesterol (Patient taking differently: Take 40 mg by mouth daily. For high cholesterol) 90 tablet 0  . promethazine (PHENERGAN) 25 MG tablet Take 25 mg by mouth every 6 (six) hours as needed for nausea  or vomiting.    Marland Kitchen tiZANidine (ZANAFLEX) 2 MG tablet Take by mouth every 6 (six) hours as needed for muscle spasms.    . traZODone (DESYREL) 50 MG tablet Take 1 tablet (50 mg total) by mouth at bedtime. 30 tablet 2  . venlafaxine XR (EFFEXOR XR) 75 MG 24 hr capsule Take 1 capsule (75 mg total) by mouth daily. 30 capsule 2  . albuterol (PROAIR HFA) 108 (90 BASE) MCG/ACT inhaler Inhale 2 puffs into the lungs every 4 (four) hours as needed for wheezing or shortness of breath.      No results found for this or any previous visit (from the past 48 hour(s)). No results found.  ROS  Blood pressure 107/71, pulse 88, temperature 96.7 F (35.9 C), temperature source Oral, resp. rate 20, height 5' 6"  (1.676 m), weight 135 lb (61.236 kg), SpO2 97 %. Physical Exam  Constitutional: She appears well-developed and well-nourished.  HENT:  Mouth/Throat: Oropharynx is clear and moist.  Eyes: Conjunctivae are normal. No scleral icterus.  Neck: No thyromegaly present.  Cardiovascular: Normal rate, regular rhythm and normal heart sounds.   No murmur heard. Respiratory: Effort normal and breath sounds normal.  GI: Soft. She exhibits no distension and no mass. There is no tenderness.  Musculoskeletal: She exhibits no edema.  Lymphadenopathy:    She has no cervical adenopathy.  Neurological: She is alert.  Skin: Skin is warm and dry.     Assessment/Plan Nausea anorexia and weight loss. Chronic GERD. Diagnostic EGD and average risk screening colonoscopy.  Lauren [name] U 10/23/2014, 10:48 AM

## 2014-10-27 ENCOUNTER — Other Ambulatory Visit (INDEPENDENT_AMBULATORY_CARE_PROVIDER_SITE_OTHER): Payer: Self-pay | Admitting: Internal Medicine

## 2014-10-27 ENCOUNTER — Ambulatory Visit (HOSPITAL_COMMUNITY)
Admission: RE | Admit: 2014-10-27 | Discharge: 2014-10-27 | Disposition: A | Discharge status: Home / Self Care | Payer: Medicare Other | Source: Ambulatory Visit | Attending: Internal Medicine | Admitting: Internal Medicine

## 2014-10-27 ENCOUNTER — Encounter (INDEPENDENT_AMBULATORY_CARE_PROVIDER_SITE_OTHER): Payer: Self-pay | Admitting: *Deleted

## 2014-10-27 DIAGNOSIS — R29890 Loss of height: Secondary | ICD-10-CM | POA: Diagnosis not present

## 2014-10-27 DIAGNOSIS — M069 Rheumatoid arthritis, unspecified: Secondary | ICD-10-CM | POA: Insufficient documentation

## 2014-10-27 DIAGNOSIS — Z13820 Encounter for screening for osteoporosis: Secondary | ICD-10-CM | POA: Insufficient documentation

## 2014-10-27 DIAGNOSIS — Z780 Asymptomatic menopausal state: Secondary | ICD-10-CM | POA: Diagnosis not present

## 2014-10-27 DIAGNOSIS — B1920 Unspecified viral hepatitis C without hepatic coma: Secondary | ICD-10-CM

## 2014-10-27 DIAGNOSIS — M810 Age-related osteoporosis without current pathological fracture: Secondary | ICD-10-CM

## 2014-10-27 DIAGNOSIS — E559 Vitamin D deficiency, unspecified: Secondary | ICD-10-CM | POA: Insufficient documentation

## 2014-10-28 ENCOUNTER — Encounter (HOSPITAL_COMMUNITY): Payer: Self-pay | Admitting: Internal Medicine

## 2014-10-30 ENCOUNTER — Ambulatory Visit (INDEPENDENT_AMBULATORY_CARE_PROVIDER_SITE_OTHER): Payer: 59 | Admitting: Psychology

## 2014-10-30 DIAGNOSIS — F419 Anxiety disorder, unspecified: Secondary | ICD-10-CM | POA: Diagnosis not present

## 2014-10-30 DIAGNOSIS — F39 Unspecified mood [affective] disorder: Secondary | ICD-10-CM | POA: Diagnosis not present

## 2014-10-31 ENCOUNTER — Ambulatory Visit (HOSPITAL_COMMUNITY)
Admission: RE | Admit: 2014-10-31 | Discharge: 2014-10-31 | Disposition: A | Discharge status: Home / Self Care | Payer: Medicare Other | Source: Ambulatory Visit | Attending: Internal Medicine | Admitting: Internal Medicine

## 2014-10-31 DIAGNOSIS — R110 Nausea: Secondary | ICD-10-CM | POA: Insufficient documentation

## 2014-10-31 DIAGNOSIS — B182 Chronic viral hepatitis C: Secondary | ICD-10-CM | POA: Diagnosis not present

## 2014-10-31 DIAGNOSIS — Z9049 Acquired absence of other specified parts of digestive tract: Secondary | ICD-10-CM | POA: Diagnosis not present

## 2014-10-31 DIAGNOSIS — B1920 Unspecified viral hepatitis C without hepatic coma: Secondary | ICD-10-CM | POA: Diagnosis not present

## 2014-11-03 ENCOUNTER — Telehealth (INDEPENDENT_AMBULATORY_CARE_PROVIDER_SITE_OTHER): Payer: Self-pay | Admitting: *Deleted

## 2014-11-03 DIAGNOSIS — B182 Chronic viral hepatitis C: Secondary | ICD-10-CM

## 2014-11-03 NOTE — Telephone Encounter (Signed)
Per Dr.Rehman the patient will need to have labs drawn prior to Hep C treatment.

## 2014-11-07 ENCOUNTER — Telehealth (HOSPITAL_COMMUNITY): Payer: Self-pay | Admitting: *Deleted

## 2014-11-07 DIAGNOSIS — B182 Chronic viral hepatitis C: Secondary | ICD-10-CM | POA: Diagnosis not present

## 2014-11-08 LAB — HEPATIC FUNCTION PANEL
ALBUMIN: 4.3 g/dL (ref 3.5–5.2)
ALT: 34 U/L (ref 0–35)
AST: 31 U/L (ref 0–37)
Alkaline Phosphatase: 58 U/L (ref 39–117)
BILIRUBIN INDIRECT: 0.3 mg/dL (ref 0.2–1.2)
Bilirubin, Direct: 0.1 mg/dL (ref 0.0–0.3)
TOTAL PROTEIN: 7.2 g/dL (ref 6.0–8.3)
Total Bilirubin: 0.4 mg/dL (ref 0.2–1.2)

## 2014-11-10 LAB — HEPATITIS C RNA QUANTITATIVE
HCV QUANT LOG: 6.76 {Log} — AB (ref <1.18)
HCV QUANT: 5769365 [IU]/mL — AB (ref <15)

## 2014-11-11 LAB — HEPATITIS C GENOTYPE

## 2014-11-19 ENCOUNTER — Encounter (HOSPITAL_COMMUNITY): Payer: Self-pay | Admitting: Psychology

## 2014-11-19 ENCOUNTER — Ambulatory Visit (INDEPENDENT_AMBULATORY_CARE_PROVIDER_SITE_OTHER): Payer: 59 | Admitting: Psychology

## 2014-11-19 DIAGNOSIS — F39 Unspecified mood [affective] disorder: Secondary | ICD-10-CM | POA: Diagnosis not present

## 2014-11-19 DIAGNOSIS — F419 Anxiety disorder, unspecified: Secondary | ICD-10-CM

## 2014-11-19 NOTE — Progress Notes (Signed)
PROGRESS NOTE   Patient:  Lauren [name]   DOB: 02/20/1951  MR Number: [id number]  Location: [facility] [address] [place] [address] Dept: [phone]  Start: 2 PM End: 3 PM  Provider/Observer:     Edgardo Roys PSYD  Chief Complaint:      Chief Complaint  Patient presents with  . Agitation  . Anxiety  . Depression  . Stress  . Trauma    Reason For Service:     The patient has a long history of major depressive condition and ongoing underlying depressive disorder. She is a recovering alcoholic but has many many years this Friday. Significant symptoms of anxiety have played a major role in substance abuse in the past. Significant medical problems and her inability to work have complicated her situation. When all these difficulties are put in the context and relationship to potential workability it is clear that she is disabled not do not expect her to return to gainful employment.  Interventions Strategy:  Cognitive/behavioral psychotherapy  Participation Level:   Active  Participation Quality:  Appropriate      Behavioral Observation:  Well Groomed, Alert, and Appropriate.   Current Psychosocial Factors: The patient reports that she has been overwhelmed recently after having the face of the fact that she had been looking at her daughter-in-law's emails on her husband's phone. This was not a spine type of situation with the patient found some way to hack into the emails but they appeared on her husband's phone when she knows she totaled about it after a period of time and the remaining there the patient looked at them out of concerns about what the daughter-in-law was thinking about her or her family. While she actually found out that the daughter-in-law was not as manipulative or secretive her husband ended up saying something to the daughter-in-law that the patient fears  that the daughter-in-law know that she looked at these emails and was feeling overwhelmed by it.    Content of Session:   Review current symptoms and continue to work on therapeutic interventions for improved coping skills and strategies.  Current Status:   The patient reports that she has been doing much better than she was after her acute decompensation about 3 weeks ago. The patient reports that while she continues to have significant depression anxiety she has shown steady improvement persistent improvement.  Patient Progress:   Stable and improving  Target Goals:   Reduce the frequency of depressive events including feelings of hopelessness and helplessness, anhedonia, social withdrawal, as well as significant anxiety symptoms related to social avoidance, fear of abandonment and fear of inability to manage and cope with her world. We will also look at continuing complete sobriety.  Last Reviewed:   11/19/2014  Goals Addressed Today:    Target goals addressed today have to do with issues of depression in particular feelings of helplessness and hopelessness.   Impression/Diagnosis:   The patient has a long history of depressive disorder and underlying anxiety disorder. She has had numerous medical issues including severe fibromyalgia and severe arthritis. She has been diagnosed with significant arthritis throughout her body and in particular her hands and her feet. Numerous events and left are overwhelmed and unable to work. The patient has had periodic times of improvement for primary symptoms but there are other times where the symptoms are so problematic that she has not been able to maintain  gainful employment.  Diagnosis:    Axis I:  Mood disorder  Anxiety    RODENBOUGH,JOHN R, PsyD 11/19/2014     RODENBOUGH,JOHN R, PsyD 11/19/2014

## 2014-11-24 ENCOUNTER — Ambulatory Visit (HOSPITAL_COMMUNITY): Payer: Self-pay | Admitting: Psychology

## 2014-11-26 ENCOUNTER — Ambulatory Visit (INDEPENDENT_AMBULATORY_CARE_PROVIDER_SITE_OTHER): Payer: 59 | Admitting: Psychiatry

## 2014-11-26 ENCOUNTER — Encounter (HOSPITAL_COMMUNITY): Payer: Self-pay | Admitting: Psychiatry

## 2014-11-26 VITALS — BP 139/77 | HR 90 | Ht 66.0 in | Wt 138.8 lb

## 2014-11-26 DIAGNOSIS — F332 Major depressive disorder, recurrent severe without psychotic features: Secondary | ICD-10-CM | POA: Diagnosis not present

## 2014-11-26 DIAGNOSIS — G4700 Insomnia, unspecified: Secondary | ICD-10-CM | POA: Diagnosis not present

## 2014-11-26 MED ORDER — VENLAFAXINE HCL ER 75 MG PO CP24: 75.0000 mg | ORAL_CAPSULE | Freq: Every day | ORAL | Status: DC

## 2014-11-26 MED ORDER — TRAZODONE HCL 50 MG PO TABS: 50.0000 mg | ORAL_TABLET | Freq: Every evening | ORAL | Status: DC | PRN

## 2014-11-26 MED ORDER — LORAZEPAM 1 MG PO TABS
ORAL_TABLET | ORAL | Status: DC
Start: 2014-11-26 — End: 2015-01-23

## 2014-11-26 MED ORDER — LAMOTRIGINE 25 MG PO TABS: 50.0000 mg | ORAL_TABLET | Freq: Two times a day (BID) | ORAL | Status: DC

## 2014-11-26 NOTE — Progress Notes (Signed)
Patient ID: ADESUWA OSGOOD, female   DOB: Jul 19, 1951, 64 y.o.   MRN: [id number] Patient ID: SERA HITSMAN, female   DOB: 1950-10-21, 64 y.o.   MRN: [id number] Patient ID: QUIANNA AVERY, female   DOB: Jun 05, 1951, 64 y.o.   MRN: [id number] Patient ID: JADALYNN BURR, female   DOB: 12/23/1950, 64 y.o.   MRN: [id number] Patient ID: AIRAM RUNIONS, female   DOB: March 12, 1951, 64 y.o.   MRN: [id number] Patient ID: WHITTNEY STEENSON, female   DOB: [birth year], 64 y.o.   MRN: [id number] Patient ID: WANONA STARE, female   DOB: 1951-01-02, 64 y.o.   MRN: [id number] Patient ID: KEIARAH ORLOWSKI, female   DOB: 1951/09/04, 64 y.o.   MRN: [id number] Patient ID: MISTIE ADNEY, female   DOB: [birth year], 64 y.o.   MRN: [id number] Patient ID: KALKIDAN CAUDELL, female   DOB: 06/12/1951, 64 y.o.   MRN: [id number] Patient ID: KAMBRE MESSNER, female   DOB: 10-May-1951, 64 y.o.   MRN: [id number] Patient ID: MYLI PAE, female   DOB: 1951/06/09, 64 y.o.   MRN: [id number] Patient ID: BRYLYNN HANSSEN, female   DOB: 1950-09-25, 64 y.o.   MRN: [id number] Patient ID: SHIKITA VAILLANCOURT, female   DOB: [birth year], 64 y.o.   MRN: [id number] [place] [address] Progress Note KARIELLE DAVIDOW MRN: [id number] DOB: 1951/06/23 Age: 64 y.o.  Date: 11/26/2014  Chief Complaint: Chief Complaint  Patient presents with  . Anxiety  . Manic Behavior  . Depression   Subjective: I'm doing better  History of presenting illness This patient is a 64 year old married white female who lives with her husband. She has one stepchild. Her step son killed himself 7 years ago at age 29. He was an alcoholic. 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  The patient has a long history of depression. She was last hospitalized in 2010 after she took too many pain pills and got confused. She use to drink heavily but has been sober 23 years and is very active in [employer] and church. For the  most part she feels her mood is stable. She sleeping pretty well. Her energy is good. She has a hard time functioning without Ativan but she only usually takes one pill a day side think this is reasonable. She tried a higher dose of Cymbalta but it made her manic and revved up  The patient returns after 2 months. In general she is doing ok. Her stepdaughter is avoiding her and is not bringing the grandchildren around. She feels bad because she accidentally read one of her stepdaughter's emails on her husband's phone and felt like "she invaded her privacy". She's blowing this out of proportion and crying a lot. On the positive side she's been to spitting and church activities, she sleeping well and her mood is generally pretty good. She's not had any manic symptoms and denies suicidal ideation. Allergies: Allergies  Allergen Reactions  . Elavil [Amitriptyline] Other (See Comments)    Vivid dreams and almost suicidal  . Flagyl [Metronidazole] Itching  . Vistaril [Hydroxyzine Hcl] Other (See Comments)    Got higher than a kite on too much of this.  Lindajo Royal [Ziprasidone Hydrochloride]   . Lactose Intolerance (Gi)   Medical History: Past Medical History  Diagnosis Date  . Depression   . Osteoarthritis   . Fibromyalgia   . IBS (irritable bowel syndrome)   .  GERD (gastroesophageal reflux disease)   . Prediabetes   . Chronic pain   . Fibromyalgia   Surgical History: Past Surgical History  Procedure Laterality Date  . Tonsillectomy    . Cholecystectomy    . Colonoscopy    . Upper gastrointestinal endoscopy    . Mandible surgery  09/06/1979  . Orif wrist fracture Right 07/23/2013    Procedure: OPEN REDUCTION INTERNAL FIXATION (ORIF) WRIST FRACTURE;  Surgeon: Roseanne Kaufman, MD;  Location: [facility];  Service: Orthopedics;  Laterality: Right;  . Colonoscopy N/A 10/23/2014    Procedure: COLONOSCOPY;  Surgeon: Rogene Houston, MD;  [facility];  Service: Endoscopy;  Laterality: N/A;  1030   . Esophagogastroduodenoscopy N/A 10/23/2014    Procedure: ESOPHAGOGASTRODUODENOSCOPY (EGD);  Surgeon: Rogene Houston, MD;  [facility];  Service: Endoscopy;  Laterality: N/A;  Family History family history includes Alcohol abuse in her cousin, father, maternal grandfather, maternal grandmother, paternal grandfather, paternal grandmother, and paternal uncle; Anxiety disorder in her maternal uncle and paternal uncle; Depression in her father; Diabetes in her brother, brother, and father; Healthy in her brother; Heart disease in her brother; Irritable bowel syndrome in her mother; OCD in her brother and brother; Sexual abuse in her mother; Stroke in her father and mother. There is no history of ADD / ADHD, Bipolar disorder, Dementia, Drug abuse, Paranoid behavior, Schizophrenia, Seizures, or Physical abuse. Reviewed al this again to day in the appointment and nothing has changed  Mental status examination Patient is neatly dressed and groomed.  She she states her mood is anxious and she is a bit tearful about her stepdaughter but brightened up after a while She denies paranoia and she denies any hallucination. She denies suicidal and homicidal ideation her speech is clear and understandable, she no longer has sped up speech or agitation She denies auditory and visual hallucinations or paranoia Her attention and concentration is fair. Her thought process is normal  She's alert and oriented x3 and her insight judgment and impulse control are good  Lab Results:  Results for orders placed or performed in visit on 11/03/14 (from the past 8736 hour(s))  Hepatic function panel   Collection Time: 11/07/14  4:03 PM  Result Value Ref Range   Total Bilirubin 0.4 0.2 - 1.2 mg/dL   Bilirubin, Direct 0.1 0.0 - 0.3 mg/dL   Indirect Bilirubin 0.3 0.2 - 1.2 mg/dL   Alkaline Phosphatase 58 39 - 117 U/L   AST 31 0 - 37 U/L   ALT 34 0 - 35 U/L   Total Protein 7.2 6.0 - 8.3 g/dL   Albumin 4.3 3.5 - 5.2  g/dL  Hepatitis C RNA quantitative   Collection Time: 11/07/14  4:03 PM  Result Value Ref Range   HCV Quantitative 5631497 (H) <15 IU/mL   HCV Quantitative Log 6.76 (H) <1.18 log 10  Hepatitis C genotype   Collection Time: 11/07/14  4:03 PM  Result Value Ref Range   HCV Genotype 1a   Results for orders placed or performed in visit on 09/11/14 (from the past 8736 hour(s))  H. pylori antibody, IgG   Collection Time: 09/11/14  4:16 PM  Result Value Ref Range   H Pylori IgG 0.52 ISR  Results for orders placed or performed during the hospital encounter of 03/18/14 (from the past 8736 hour(s))  Glucose, capillary   Collection Time: 03/18/14 12:08 PM  Result Value Ref Range   Glucose-Capillary 114 (H) 70 - 99 mg/dL  Glucose, capillary   Collection Time: 03/18/14  5:09 PM  Result Value Ref Range   Glucose-Capillary 110 (H) 70 - 99 mg/dL   Comment 1 Notify RN   TSH   Collection Time: 03/19/14  6:30 AM  Result Value Ref Range   TSH 2.400 0.350 - 4.500 uIU/mL  Glucose, capillary   Collection Time: 03/19/14  6:31 AM  Result Value Ref Range   Glucose-Capillary 140 (H) 70 - 99 mg/dL  Glucose, capillary   Collection Time: 03/19/14  4:54 PM  Result Value Ref Range   Glucose-Capillary 120 (H) 70 - 99 mg/dL   Comment 1 Notify RN   Glucose, capillary   Collection Time: 03/20/14  5:59 AM  Result Value Ref Range   Glucose-Capillary 119 (H) 70 - 99 mg/dL  Glucose, capillary   Collection Time: 03/20/14  4:43 PM  Result Value Ref Range   Glucose-Capillary 121 (H) 70 - 99 mg/dL   Comment 1 Notify RN   Glucose, capillary   Collection Time: 03/21/14  6:06 AM  Result Value Ref Range   Glucose-Capillary 135 (H) 70 - 99 mg/dL  Results for orders placed or performed during the hospital encounter of 03/17/14 (from the past 8736 hour(s))  Urine culture   Collection Time: 03/17/14  2:31 PM  Result Value Ref Range   Specimen Description URINE, CLEAN CATCH    Special Requests NONE    Culture   Setup Time      03/17/2014 20:40 Performed at [facility] Performed at [facility]    Culture NO GROWTH Performed at [facility]    Report Status 03/18/2014 FINAL   Urinalysis, Routine w reflex microscopic   Collection Time: 03/17/14  2:31 PM  Result Value Ref Range   Color, Urine YELLOW YELLOW   APPearance CLEAR CLEAR   Specific Gravity, Urine <1.005 (L) 1.005 - 1.030   pH 6.5 5.0 - 8.0   Glucose, UA NEGATIVE NEGATIVE mg/dL   Hgb urine dipstick NEGATIVE NEGATIVE   Bilirubin Urine NEGATIVE NEGATIVE   Ketones, ur NEGATIVE NEGATIVE mg/dL   Protein, ur NEGATIVE NEGATIVE mg/dL   Urobilinogen, UA 0.2 0.0 - 1.0 mg/dL   Nitrite NEGATIVE NEGATIVE   Leukocytes, UA NEGATIVE NEGATIVE  Urine rapid drug screen (hosp performed)   Collection Time: 03/17/14  2:31 PM  Result Value Ref Range   Opiates NONE DETECTED NONE DETECTED   Cocaine NONE DETECTED NONE DETECTED   Benzodiazepines NONE DETECTED NONE DETECTED   Amphetamines NONE DETECTED NONE DETECTED   Tetrahydrocannabinol NONE DETECTED NONE DETECTED   Barbiturates NONE DETECTED NONE DETECTED  CBC with Differential   Collection Time: 03/17/14  2:32 PM  Result Value Ref Range   WBC 6.0 4.0 - 10.5 K/uL   RBC 5.02 3.87 - 5.11 MIL/uL   Hemoglobin 14.1 12.0 - 15.0 g/dL   HCT 42.7 36.0 - 46.0 %   MCV 85.1 78.0 - 100.0 fL   MCH 28.1 26.0 - 34.0 pg   MCHC 33.0 30.0 - 36.0 g/dL   RDW 13.3 11.5 - 15.5 %   Platelets 214 150 - 400 K/uL   Neutrophils Relative % 64 43 - 77 %   Neutro Abs 3.8 1.7 - 7.7 K/uL   Lymphocytes Relative 26 12 - 46 %   Lymphs Abs 1.6 0.7 - 4.0 K/uL   Monocytes Relative 8 3 - 12 %   Monocytes Absolute 0.5 0.1 - 1.0 K/uL   Eosinophils  Relative 2 0 - 5 %   Eosinophils Absolute 0.1 0.0 - 0.7 K/uL   Basophils Relative 0 0 - 1 %   Basophils Absolute 0.0 0.0 - 0.1 K/uL  Basic metabolic panel   Collection Time: 03/17/14  2:32 PM  Result Value Ref Range   Sodium 138 137 -  147 mEq/L   Potassium 4.4 3.7 - 5.3 mEq/L   Chloride 99 96 - 112 mEq/L   CO2 29 19 - 32 mEq/L   Glucose, Bld 122 (H) 70 - 99 mg/dL   BUN 10 6 - 23 mg/dL   Creatinine, Ser 0.55 0.50 - 1.10 mg/dL   Calcium 10.0 8.4 - 10.5 mg/dL   GFR calc non Af Amer >90 >90 mL/min   GFR calc Af Amer >90 >90 mL/min   Anion gap 10 5 - 15  Ethanol   Collection Time: 03/17/14  2:32 PM  Result Value Ref Range   Alcohol, Ethyl (B) <11 0 - 11 mg/dL  POC CBG, ED   Collection Time: 03/18/14  9:07 AM  Result Value Ref Range   Glucose-Capillary 196 (H) 70 - 99 mg/dL  Family doctor is following her labs  Assessment Axis I Maj. depressive disorder, insomnia, pain issues, HIstory of alcoholism. Axis II deferred Axis III see medical history Axis IV mild to moderate  Plan: She she'll continue trazodone and Ativan.she'll continue Lamictal 50 mg twice a day. She'll continue Effexor XR to 75 mg daily She'll return in 2 months  MEDICATIONS this encounter: Meds ordered this encounter  Medications  . venlafaxine XR (EFFEXOR XR) 75 MG 24 hr capsule    Sig: Take 1 capsule (75 mg total) by mouth daily.    Dispense:  30 capsule    Refill:  2  . traZODone (DESYREL) 50 MG tablet    Sig: Take 1 tablet (50 mg total) by mouth at bedtime as needed.    Dispense:  30 tablet    Refill:  2  . lamoTRIgine (LAMICTAL) 25 MG tablet    Sig: Take 2 tablets (50 mg total) by mouth 2 (two) times daily.    Dispense:  120 tablet    Refill:  2  . LORazepam (ATIVAN) 1 MG tablet    Sig: Take 2 at night    Dispense:  60 tablet    Refill:  2    Medical Decision Making Problem Points:  Established problem, worsening (2), Review of last therapy session (1) and Review of psycho-social stressors (1) Data Points:  Review or order clinical lab tests (1) Review of medication regiment & side effects (2) Review of new medications or change in dosage (2)  I certify that outpatient services furnished can reasonably be expected to improve  the patient's condition.   Levonne Spiller, MD

## 2014-12-01 ENCOUNTER — Ambulatory Visit (HOSPITAL_COMMUNITY): Payer: Self-pay | Admitting: Psychiatry

## 2014-12-02 ENCOUNTER — Telehealth (INDEPENDENT_AMBULATORY_CARE_PROVIDER_SITE_OTHER): Payer: Self-pay | Admitting: *Deleted

## 2014-12-02 NOTE — Telephone Encounter (Signed)
Harvoni has been received and [place] has concerns/questions about the medicine. Would like to speak with the doctor before she starts the medicine and the return phone number is [phone]. Patient called back crying wanting to speak with Dr. Laural Golden only.

## 2014-12-03 ENCOUNTER — Ambulatory Visit (HOSPITAL_COMMUNITY)
Admission: RE | Admit: 2014-12-03 | Discharge: 2014-12-03 | Disposition: A | Discharge status: Home / Self Care | Payer: Medicare Other | Source: Ambulatory Visit | Attending: Internal Medicine | Admitting: Internal Medicine

## 2014-12-03 ENCOUNTER — Other Ambulatory Visit (HOSPITAL_COMMUNITY): Payer: Self-pay | Admitting: Internal Medicine

## 2014-12-03 DIAGNOSIS — S99911A Unspecified injury of right ankle, initial encounter: Secondary | ICD-10-CM

## 2014-12-03 DIAGNOSIS — S99921A Unspecified injury of right foot, initial encounter: Secondary | ICD-10-CM

## 2014-12-03 DIAGNOSIS — X58XXXA Exposure to other specified factors, initial encounter: Secondary | ICD-10-CM | POA: Diagnosis not present

## 2014-12-03 DIAGNOSIS — M7731 Calcaneal spur, right foot: Secondary | ICD-10-CM | POA: Insufficient documentation

## 2014-12-03 DIAGNOSIS — S99919A Unspecified injury of unspecified ankle, initial encounter: Secondary | ICD-10-CM | POA: Diagnosis not present

## 2014-12-03 NOTE — Telephone Encounter (Signed)
Patient has been contacted by Butch Penny, and given an appointment for 12/04/14 at 3:45 pm to discuss treatment.

## 2014-12-04 ENCOUNTER — Encounter (INDEPENDENT_AMBULATORY_CARE_PROVIDER_SITE_OTHER): Payer: Self-pay | Admitting: Internal Medicine

## 2014-12-04 ENCOUNTER — Ambulatory Visit (INDEPENDENT_AMBULATORY_CARE_PROVIDER_SITE_OTHER): Payer: Medicare Other | Admitting: Internal Medicine

## 2014-12-04 VITALS — BP 122/62 | HR 96 | Temp 98.5°F | Resp 18 | Ht 66.0 in | Wt 135.0 lb

## 2014-12-04 DIAGNOSIS — B182 Chronic viral hepatitis C: Secondary | ICD-10-CM

## 2014-12-04 MED ORDER — RANITIDINE HCL 150 MG PO TABS: 150.0000 mg | ORAL_TABLET | Freq: Two times a day (BID) | ORAL | Status: DC

## 2014-12-04 NOTE — Patient Instructions (Addendum)
Stop pravastatin. Omeprazole dose on 12/06/2014 and stop. Ranitidine 150 mg by mouth twice a day. Will plan to start [facility] next week. Will arrange for office visit in 4 weeks.

## 2014-12-04 NOTE — Progress Notes (Signed)
Presenting complaint;  Patient is here to discuss therapy for hepatitis C.  Subjective:  Patient is 64 year old Caucasian female was chronic hepatitis 1a and therapy has been delayed because of history of depression and anxiety until now. She was approved for therapy with Harvoni. He has received 4 weeks of therapy. After reading about side effects and potential interaction she wanted to talk with me first. She states her depression anxiety is well controlled with therapy. She is just worried about drug interactions.    Current Medications: Outpatient Encounter Prescriptions as of 12/04/2014  Medication Sig  . albuterol (PROAIR HFA) 108 (90 BASE) MCG/ACT inhaler Inhale 2 puffs into the lungs every 4 (four) hours as needed for wheezing or shortness of breath.  Marland Kitchen aspirin 81 MG tablet Take 81 mg by mouth daily.  . calcium-vitamin D (OSCAL WITH D) 500-200 MG-UNIT per tablet Take 1 tablet by mouth 2 (two) times daily. For low calcium  . diphenoxylate-atropine (LOMOTIL) 2.5-0.025 MG per tablet Take 1 tablet by mouth 4 (four) times daily as needed for diarrhea or loose stools.  . hyoscyamine (LEVSIN SL) 0.125 MG SL tablet Place 1 tablet (0.125 mg total) under the tongue 3 (three) times daily as needed.  . lamoTRIgine (LAMICTAL) 25 MG tablet Take 2 tablets (50 mg total) by mouth 2 (two) times daily.  Marland Kitchen LORazepam (ATIVAN) 1 MG tablet Take 2 at night  . omeprazole (PRILOSEC) 40 MG capsule Take 1 capsule (40 mg total) by mouth daily.  Marland Kitchen oxyCODONE-acetaminophen (PERCOCET) 10-325 MG per tablet Take 1 tablet by mouth 4 (four) times daily.  . pravastatin (PRAVACHOL) 20 MG tablet Take 1 tablet (20 mg total) by mouth daily. For high cholesterol (Patient taking differently: Take 40 mg by mouth daily. For high cholesterol)  . promethazine (PHENERGAN) 25 MG tablet Take 25 mg by mouth every 6 (six) hours as needed for nausea or vomiting.  Marland Kitchen tiZANidine (ZANAFLEX) 2 MG tablet Take by mouth every 6 (six) hours as  needed for muscle spasms.  . traZODone (DESYREL) 50 MG tablet Take 1 tablet (50 mg total) by mouth at bedtime as needed.  . venlafaxine XR (EFFEXOR XR) 75 MG 24 hr capsule Take 1 capsule (75 mg total) by mouth daily.    Objective: Blood pressure 122/62, pulse 96, temperature 98.5 F (36.9 C), temperature source Oral, resp. rate 18, height 5\' 6"  (1.676 m), weight 135 lb (61.236 kg). Patient is alert and in no acute distress..  Labs/studies Results: Lab data from 11/07/2014 Bilirubin 0.4, AP 58, AST 31, ALT 34 and albumin 4.3 HCVRNA quantitative is 2355732 IU/mL HCV quantitative log is 6.76 Genotype 1A  Assessment:  #1. Chronic hepatitis C genotype 1a. Because of history of depression and anxiety therapy she was advised against therapy with interferon based treatments. Patient's medications reviewed. She needs to be off PPI and pravastatin while she is on antiviral therapy. I do not believe any changes need to be made and other medications that she will need to be followed closely.   Plan:  Patient reassured and advised to contact me if she has any questions. Discontinue pravastatin. Discontinue omeprazole after next dose on 4 second 2016. Ranitidine 150 mg by mouth twice a day. Patient will call office with progress report at which time we'll make decision about initiating antiviral therapy. CBC after she has been on Harvoni for 2 weeks. CBC LFTs and HCV RNA level at 4 weeks and office visit in 5 weeks.

## 2014-12-09 ENCOUNTER — Emergency Department (HOSPITAL_COMMUNITY)
Admission: EM | Admit: 2014-12-09 | Discharge: 2014-12-09 | Discharge status: Against Medical Advice | Payer: Medicare Other | Attending: Emergency Medicine | Admitting: Emergency Medicine

## 2014-12-09 DIAGNOSIS — G8929 Other chronic pain: Secondary | ICD-10-CM | POA: Diagnosis not present

## 2014-12-09 DIAGNOSIS — M79603 Pain in arm, unspecified: Secondary | ICD-10-CM | POA: Insufficient documentation

## 2014-12-09 NOTE — ED Notes (Addendum)
Pt expressing some concern about having an MI.  Discussed with patient classic signs of an MI, to including chest pain, SOB, diaphoresis, nausea, in addition to pain in arm.  Pt denies any of these symptoms. Denies previous history of cardiac problems, HTN, or other risk factors. Pt stating "Well, then I think I'm just going to go home."  Explained to pt that without testing, there would be no way to determine definitely if she had an MI.  Pt decided she would go home without being seen or evaluated.

## 2014-12-09 NOTE — ED Notes (Addendum)
Pt reporting pain in arm that started about 11pm.  Denies injury, states pain just began. Pt also reporting pain then moved into shoulder.

## 2014-12-11 ENCOUNTER — Ambulatory Visit (HOSPITAL_COMMUNITY): Admitting: Psychology

## 2014-12-23 ENCOUNTER — Ambulatory Visit (INDEPENDENT_AMBULATORY_CARE_PROVIDER_SITE_OTHER): Payer: 59 | Admitting: Psychology

## 2014-12-23 DIAGNOSIS — F419 Anxiety disorder, unspecified: Secondary | ICD-10-CM

## 2014-12-23 DIAGNOSIS — F332 Major depressive disorder, recurrent severe without psychotic features: Secondary | ICD-10-CM

## 2014-12-24 ENCOUNTER — Encounter (HOSPITAL_COMMUNITY): Payer: Self-pay | Admitting: Psychology

## 2014-12-24 NOTE — Progress Notes (Signed)
      PROGRESS NOTE   Patient:  Lauren [name]   DOB: 01-Mar-1951  MR Number: [id number]  Location: [facility] [address] [place] [address] Dept: [phone]  Start: 2 PM End: 3 PM  Provider/Observer:     Edgardo Roys PSYD  Chief Complaint:      Chief Complaint  Patient presents with  . Anxiety  . Depression  . Paranoid    Reason For Service:     The patient has a long history of major depressive condition and ongoing underlying depressive disorder. She is a recovering alcoholic but has many many years this Friday. Significant symptoms of anxiety have played a major role in substance abuse in the past. Significant medical problems and her inability to work have complicated her situation. When all these difficulties are put in the context and relationship to potential workability it is clear that she is disabled not do not expect her to return to gainful employment.  Interventions Strategy:  Cognitive/behavioral psychotherapy  Participation Level:   Active  Participation Quality:  Appropriate      Behavioral Observation:  Well Groomed, Alert, and Appropriate.   Current Psychosocial Factors: The patient reports that she has been overwhelmed recently after having the face of the fact that she had been looking at her daughter-in-law's emails on her husband's phone. This was not a spine type of situation with the patient found some way to hack into the emails but they appeared on her husband's phone when she knows she totaled about it after a period of time and the remaining there the patient looked at them out of concerns about what the daughter-in-law was thinking about her or her family. While she actually found out that the daughter-in-law was not as manipulative or secretive her husband ended up saying something to the daughter-in-law that the patient fears that the daughter-in-law  know that she looked at these emails and was feeling overwhelmed by it.    Content of Session:   Review current symptoms and continue to work on therapeutic interventions for improved coping skills and strategies.  Current Status:   The patient reports that she has   Patient Progress:   Stable and improving  Target Goals:   Reduce the frequency of depressive events including feelings of hopelessness and helplessness, anhedonia, social withdrawal, as well as significant anxiety symptoms related to social avoidance, fear of abandonment and fear of inability to manage and cope with her world. We will also look at continuing complete sobriety.  Last Reviewed:   12/23/2014  Goals Addressed Today:    Target goals addressed today have to do with issues of depression in particular feelings of helplessness and hopelessness.   Impression/Diagnosis:   The patient has a long history of depressive disorder and underlying anxiety disorder. She has had numerous medical issues including severe fibromyalgia and severe arthritis. She has been diagnosed with significant arthritis throughout her body and in particular her hands and her feet. Numerous events and left are overwhelmed and unable to work. The patient has had periodic times of improvement for primary symptoms but there are other times where the symptoms are so problematic that she has not been able to maintain gainful employment.  Diagnosis:    Axis I:  Major depressive disorder, recurrent, severe without psychotic features  Anxiety    Tinsley Lomas R, PsyD 12/24/2014

## 2014-12-30 ENCOUNTER — Telehealth (HOSPITAL_COMMUNITY): Payer: Self-pay | Admitting: *Deleted

## 2014-12-30 NOTE — Telephone Encounter (Signed)
Called pt home number but it just kept ringing. Called pt mobile and lmtcb and number provided. need to resch appt from 01-26-15 to another day due to provider being out of office

## 2015-01-12 ENCOUNTER — Ambulatory Visit (INDEPENDENT_AMBULATORY_CARE_PROVIDER_SITE_OTHER): Payer: Self-pay | Admitting: Internal Medicine

## 2015-01-15 ENCOUNTER — Ambulatory Visit (HOSPITAL_COMMUNITY): Payer: Self-pay | Admitting: Psychology

## 2015-01-21 ENCOUNTER — Encounter (INDEPENDENT_AMBULATORY_CARE_PROVIDER_SITE_OTHER): Payer: Self-pay | Admitting: Internal Medicine

## 2015-01-21 ENCOUNTER — Ambulatory Visit (INDEPENDENT_AMBULATORY_CARE_PROVIDER_SITE_OTHER): Payer: Medicare Other | Admitting: Internal Medicine

## 2015-01-21 DIAGNOSIS — B1920 Unspecified viral hepatitis C without hepatic coma: Secondary | ICD-10-CM | POA: Diagnosis not present

## 2015-01-21 DIAGNOSIS — K219 Gastro-esophageal reflux disease without esophagitis: Secondary | ICD-10-CM | POA: Insufficient documentation

## 2015-01-21 LAB — CBC WITH DIFFERENTIAL/PLATELET
BASOS ABS: 0.1 10*3/uL (ref 0.0–0.1)
Basophils Relative: 1 % (ref 0–1)
EOS PCT: 4 % (ref 0–5)
Eosinophils Absolute: 0.2 10*3/uL (ref 0.0–0.7)
HEMATOCRIT: 39.6 % (ref 36.0–46.0)
Hemoglobin: 13.2 g/dL (ref 12.0–15.0)
Lymphocytes Relative: 32 % (ref 12–46)
Lymphs Abs: 1.9 10*3/uL (ref 0.7–4.0)
MCH: 27.9 pg (ref 26.0–34.0)
MCHC: 33.3 g/dL (ref 30.0–36.0)
MCV: 83.7 fL (ref 78.0–100.0)
MONO ABS: 0.5 10*3/uL (ref 0.1–1.0)
MONOS PCT: 9 % (ref 3–12)
MPV: 10.7 fL (ref 8.6–12.4)
NEUTROS ABS: 3.2 10*3/uL (ref 1.7–7.7)
Neutrophils Relative %: 54 % (ref 43–77)
PLATELETS: 241 10*3/uL (ref 150–400)
RBC: 4.73 MIL/uL (ref 3.87–5.11)
RDW: 12.9 % (ref 11.5–15.5)
WBC: 5.9 10*3/uL (ref 4.0–10.5)

## 2015-01-21 NOTE — Progress Notes (Addendum)
   Subjective:    Patient ID: Lauren [name], female    DOB: 01/11/1951, 64 y.o.   MRN: [id number]  HPI Here today for f/u of her Hepatitis C. She is Genotype 1A. S . She wast last seen by Dr. Laural Golden in March. She had received the Harvoni but had not started. She did not let us know when she started. This is almost 6 weeks for her.  10/31/2014 [place] Elastrography: F2 an F3.   She tells me she is doing good.  No rashes. Appetite is good. No weight loss. She has actually gained 2 pounds since her last  Visit.  BMs are normal. No melena or BRRB/     CBC    Component Value Date/Time   WBC 6.0 03/17/2014 1432   RBC 5.02 03/17/2014 1432   HGB 14.1 03/17/2014 1432   HCT 42.7 03/17/2014 1432   PLT 214 03/17/2014 1432   MCV 85.1 03/17/2014 1432   MCH 28.1 03/17/2014 1432   MCHC 33.0 03/17/2014 1432   RDW 13.3 03/17/2014 1432   LYMPHSABS 1.6 03/17/2014 1432   MONOABS 0.5 03/17/2014 1432   EOSABS 0.1 03/17/2014 1432   BASOSABS 0.0 03/17/2014 1432    Hepatic Function Panel     Component Value Date/Time   PROT 7.2 11/07/2014 1603   ALBUMIN 4.3 11/07/2014 1603   AST 31 11/07/2014 1603   ALT 34 11/07/2014 1603   ALKPHOS 58 11/07/2014 1603   BILITOT 0.4 11/07/2014 1603   BILIDIR 0.1 11/07/2014 1603   IBILI 0.3 11/07/2014 1603     Review of Systems Married, no children    Objective:   Physical Exam Blood pressure 108/60, pulse 56, temperature 97.8 F (36.6 C), height 5\' 6"  (1.676 m), weight 137 lb 3.2 oz (62.234 kg).  Alert and oriented. Skin warm and dry. Oral mucosa is moist.   . Sclera anicteric, conjunctivae is pink. Thyroid not enlarged. No cervical lymphadenopathy. Lungs clear. Heart regular rate and rhythm.  Abdomen is soft. Bowel sounds are positive. No hepatomegaly. No abdominal masses felt. No tenderness.  No edema to lower extremities.        Assessment & Plan:  Hepatitis C. This is week 6 for her . She is doing well. No side effects. She will go to lab and get a CBC  with diff. Hepatic function and Hep C quaint.

## 2015-01-21 NOTE — Patient Instructions (Signed)
OV in 4 weeks with a CBC with diff, Hepatic function and Hep C quaint.

## 2015-01-22 ENCOUNTER — Telehealth (INDEPENDENT_AMBULATORY_CARE_PROVIDER_SITE_OTHER): Payer: Self-pay | Admitting: *Deleted

## 2015-01-22 DIAGNOSIS — B182 Chronic viral hepatitis C: Secondary | ICD-10-CM

## 2015-01-22 LAB — HEPATIC FUNCTION PANEL
ALBUMIN: 4 g/dL (ref 3.5–5.2)
ALT: 17 U/L (ref 0–35)
AST: 16 U/L (ref 0–37)
Alkaline Phosphatase: 56 U/L (ref 39–117)
BILIRUBIN TOTAL: 0.2 mg/dL (ref 0.2–1.2)
Bilirubin, Direct: 0.1 mg/dL (ref 0.0–0.3)
Indirect Bilirubin: 0.1 mg/dL — ABNORMAL LOW (ref 0.2–1.2)
TOTAL PROTEIN: 7.1 g/dL (ref 6.0–8.3)

## 2015-01-22 LAB — HEPATITIS C RNA QUANTITATIVE: HCV Quantitative: NOT DETECTED IU/mL (ref <15)

## 2015-01-22 NOTE — Telephone Encounter (Signed)
Noted . Forwarded to Tiana Loft for [employer]

## 2015-01-22 NOTE — Telephone Encounter (Signed)
For Terri & Lelon Frohlich

## 2015-01-22 NOTE — Telephone Encounter (Signed)
noted 

## 2015-01-22 NOTE — Telephone Encounter (Signed)
Lauren [name] said the Hep C Treatment start date was 12/12/14.

## 2015-01-22 NOTE — Telephone Encounter (Signed)
.  Per Lauren [name] patient is to have labs in 4 weeks.

## 2015-01-23 ENCOUNTER — Ambulatory Visit (INDEPENDENT_AMBULATORY_CARE_PROVIDER_SITE_OTHER): Payer: 59 | Admitting: Psychiatry

## 2015-01-23 ENCOUNTER — Encounter (HOSPITAL_COMMUNITY): Payer: Self-pay | Admitting: Psychiatry

## 2015-01-23 VITALS — BP 120/80 | Ht 66.0 in | Wt 135.0 lb

## 2015-01-23 DIAGNOSIS — F332 Major depressive disorder, recurrent severe without psychotic features: Secondary | ICD-10-CM | POA: Diagnosis not present

## 2015-01-23 MED ORDER — TRAZODONE HCL 50 MG PO TABS: 50.0000 mg | ORAL_TABLET | Freq: Every evening | ORAL | Status: DC | PRN

## 2015-01-23 MED ORDER — LAMOTRIGINE 25 MG PO TABS: 50.0000 mg | ORAL_TABLET | Freq: Two times a day (BID) | ORAL | Status: DC

## 2015-01-23 MED ORDER — VENLAFAXINE HCL ER 75 MG PO CP24: 75.0000 mg | ORAL_CAPSULE | Freq: Every day | ORAL | Status: DC

## 2015-01-23 MED ORDER — LORAZEPAM 1 MG PO TABS: ORAL_TABLET | ORAL | Status: DC

## 2015-01-23 NOTE — Psych (Signed)
[place] MD/PA/NP OP Progress Note  01/23/2015 1:35 PM Lauren [name]  MRN:  [id number]  Subjective:  This patient is a 64 year-old married white female who lives with her husband. She has one stepchild. Her step son killed himself 7 years ago at age 69. He was an alcoholic. 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  The patient has a long history of depression. She was last hospitalized in 2010 after she took too many pain pills and got confused. She use to drink heavily but has been sober 23 years and is very active in [employer] and church. For the most part she feels her mood is stable. She sleeping pretty well. Her energy is good. She has a hard time functioning without Ativan but she only usually takes one pill a day side think this is reasonable. She tried a higher dose of Cymbalta but it made her manic and revved up  The patient returns after 3 months. Since I last saw her she has had her hepatitis C treated with Harvoni. Her HCV counts of gone down to 0 and she is really excited about it. Her mood is been very good but she denies being manic. Her appetite remains good and she is sleeping well. She still has days in which her fibromyalgia really kicks in and she has a lot of body pain. However overall her energy is improved and her mood is better Chief Complaint:  Chief Complaint    Manic Behavior     Visit Diagnosis:     ICD-9-CM ICD-10-CM   1. Major depressive disorder, recurrent, severe without psychotic features 296.33 F33.2     Past Medical History:  Past Medical History  Diagnosis Date  . Depression   . Osteoarthritis   . Fibromyalgia   . IBS (irritable bowel syndrome)   . GERD (gastroesophageal reflux disease)   . Prediabetes   . Chronic pain   . Fibromyalgia   . Hepatitis C     Past Surgical History  Procedure Laterality Date  . Tonsillectomy    . Cholecystectomy    . Colonoscopy    . Upper  gastrointestinal endoscopy    . Mandible surgery  09/06/1979  . Orif wrist fracture Right 07/23/2013    Procedure: OPEN REDUCTION INTERNAL FIXATION (ORIF) WRIST FRACTURE;  Surgeon: Roseanne Kaufman, MD;  Location: [place];  Service: Orthopedics;  Laterality: Right;  . Colonoscopy N/A 10/23/2014    Procedure: COLONOSCOPY;  Surgeon: Rogene Houston, MD;  [facility];  Service: Endoscopy;  Laterality: N/A;  1030  . Esophagogastroduodenoscopy N/A 10/23/2014    Procedure: ESOPHAGOGASTRODUODENOSCOPY (EGD);  Surgeon: Rogene Houston, MD;  [facility];  Service: Endoscopy;  Laterality: N/A;  . Total knee arthroplasty      2011 rt knee   Family History:  Family History  Problem Relation Age of Onset  . Alcohol abuse Father   . Diabetes Father   . Stroke Father   . Depression Father   . Anxiety disorder Paternal Uncle   . Alcohol abuse Paternal Uncle   . Alcohol abuse Maternal Grandfather   . Alcohol abuse Maternal Grandmother   . Alcohol abuse Paternal Grandfather   . Alcohol abuse Paternal Grandmother   . Alcohol abuse Cousin   . Anxiety disorder Maternal Uncle   . Stroke Mother   . Irritable bowel syndrome Mother   . Sexual abuse Mother   .  Diabetes Brother   . OCD Brother   . Healthy Brother   . OCD Brother   . Diabetes Brother   . Heart disease Brother   . ADD / ADHD Neg Hx   . Bipolar disorder Neg Hx   . Dementia Neg Hx   . Drug abuse Neg Hx   . Paranoid behavior Neg Hx   . Schizophrenia Neg Hx   . Seizures Neg Hx   . Physical abuse Neg Hx    Social History:  History   Social History  . Marital Status: Married    Spouse Name: N/A  . Number of Children: N/A  . Years of Education: N/A   Social History Main Topics  . Smoking status: Former Smoker -- 2.00 packs/day    Quit date: 09/06/1983  . Smokeless tobacco: Never Used  . Alcohol Use: No  . Drug Use: No  . Sexual Activity: No   Other Topics Concern  . None   Social History Narrative    Additional History:   Assessment: The patient is stable on her current medications  Musculoskeletal: Strength & Muscle Tone: within normal limits Gait & Station: normal Patient leans: N/A  Psychiatric Specialty Exam: HPI  Review of Systems  Musculoskeletal: Positive for myalgias and joint pain.  Psychiatric/Behavioral: The patient is nervous/anxious.   All other systems reviewed and are negative.   Blood pressure 120/80, height 5\' 6"  (1.676 m), weight 61.236 kg (135 lb).Body mass index is 21.8 kg/(m^2).  General Appearance: Casual, Neat and Well Groomed  Eye Contact:  Good  Speech:  Clear and Coherent  Volume:  Increased  Mood:  Euthymic  Affect:  Congruent  Thought Process:  Coherent  Orientation:  Full (Time, Place, and Person)  Thought Content:  Rumination  Suicidal Thoughts:  No  Homicidal Thoughts:  No  Memory:  Immediate;   Good Recent;   Good Remote;   Good  Judgement:  Fair  Insight:  Fair  Psychomotor Activity:  Normal  Concentration:  Fair  Recall:  [place] of Knowledge: Good  Language: Good  Akathisia:  No  Handed:  Right  AIMS (if indicated):    Assets:  Communication Skills Desire for Improvement Physical Health  ADL's:  Intact  Cognition: WNL  Sleep:  Good    Is the patient at risk to self?  No. Has the patient been a risk to self in the past 6 months?  No. Has the patient been a risk to self within the distant past?  Yes.   Is the patient a risk to others?  No. Has the patient been a risk to others in the past 6 months?  No. Has the patient been a risk to others within the distant past?  No.  Current Medications: Current Outpatient Prescriptions  Medication Sig Dispense Refill  . albuterol (PROAIR HFA) 108 (90 BASE) MCG/ACT inhaler Inhale 2 puffs into the lungs every 4 (four) hours as needed for wheezing or shortness of breath.    Marland Kitchen aspirin 81 MG tablet Take 81 mg by mouth daily.    . calcium-vitamin D (OSCAL WITH D) 500-200 MG-UNIT  per tablet Take 1 tablet by mouth 2 (two) times daily. For low calcium    . diphenoxylate-atropine (LOMOTIL) 2.5-0.025 MG per tablet Take 1 tablet by mouth 4 (four) times daily as needed for diarrhea or loose stools. 30 tablet 0  . hyoscyamine (LEVSIN SL) 0.125 MG SL tablet Place 1 tablet (0.125 mg total) under the tongue  3 (three) times daily as needed. 90 tablet 2  . lamoTRIgine (LAMICTAL) 25 MG tablet Take 2 tablets (50 mg total) by mouth 2 (two) times daily. 120 tablet 2  . Ledipasvir-Sofosbuvir 90-400 MG TABS Take by mouth.    Marland Kitchen LORazepam (ATIVAN) 1 MG tablet Take 2 at night 60 tablet 2  . oxyCODONE-acetaminophen (PERCOCET) 10-325 MG per tablet Take 1 tablet by mouth 4 (four) times daily.  0  . pravastatin (PRAVACHOL) 20 MG tablet Take 1 tablet (20 mg total) by mouth daily. For high cholesterol (Patient not taking: Reported on 01/21/2015) 90 tablet 0  . promethazine (PHENERGAN) 25 MG tablet Take 25 mg by mouth every 6 (six) hours as needed for nausea or vomiting.    . ranitidine (ZANTAC) 150 MG tablet Take 1 tablet (150 mg total) by mouth 2 (two) times daily. 60 tablet 2  . tiZANidine (ZANAFLEX) 2 MG tablet Take by mouth every 6 (six) hours as needed for muscle spasms.    . traZODone (DESYREL) 50 MG tablet Take 1 tablet (50 mg total) by mouth at bedtime as needed. 30 tablet 2  . venlafaxine XR (EFFEXOR XR) 75 MG 24 hr capsule Take 1 capsule (75 mg total) by mouth daily. 30 capsule 2   No current facility-administered medications for this visit.    Medical Decision Making:  Established Problem, Stable/Improving (1), Review of Psycho-Social Stressors (1), Review or order clinical lab tests (1), Review of Last Therapy Session (1) and Review of Medication Regimen & Side Effects (2)  Treatment Plan Summary:Medication management patient will continue Lamictal for mood disorder, Effexor for depression, trazodone for insomnia and lorazepam for anxiety. She'll return in 3 months Lauren [name],  [facility] 01/23/2015, 1:35 PM

## 2015-01-26 ENCOUNTER — Ambulatory Visit (HOSPITAL_COMMUNITY): Payer: Self-pay | Admitting: Psychiatry

## 2015-01-26 ENCOUNTER — Telehealth (INDEPENDENT_AMBULATORY_CARE_PROVIDER_SITE_OTHER): Payer: Self-pay | Admitting: *Deleted

## 2015-01-26 NOTE — Telephone Encounter (Signed)
Labs faxed to [facility] 

## 2015-01-26 NOTE — Telephone Encounter (Signed)
[facility] is needing the patient's 4 week lab results. These are dated 01/22/15. Patient was approved for 8 weeks of treatment per insurance and Dr.Rehman is requesting 12 weeks, this is the reason they need the lab work. Their fax number is [phone] attention [facility].

## 2015-01-28 ENCOUNTER — Other Ambulatory Visit (INDEPENDENT_AMBULATORY_CARE_PROVIDER_SITE_OTHER): Payer: Self-pay | Admitting: *Deleted

## 2015-01-28 ENCOUNTER — Encounter (INDEPENDENT_AMBULATORY_CARE_PROVIDER_SITE_OTHER): Payer: Self-pay | Admitting: *Deleted

## 2015-01-28 DIAGNOSIS — B182 Chronic viral hepatitis C: Secondary | ICD-10-CM

## 2015-02-03 ENCOUNTER — Telehealth (INDEPENDENT_AMBULATORY_CARE_PROVIDER_SITE_OTHER): Payer: Self-pay | Admitting: *Deleted

## 2015-02-03 NOTE — Telephone Encounter (Signed)
Lauren [name] from [facility] called and they did rec'v all recent labs but the Hepatitis C RNA Quant by PCR. I believe that the date of service was 01/28/15. Please fax this to [place] or [place] @ [phone] , this is to try and get additional medication.

## 2015-02-03 NOTE — Telephone Encounter (Signed)
Labs faxed to [facility]

## 2015-02-05 ENCOUNTER — Other Ambulatory Visit (HOSPITAL_COMMUNITY): Payer: Self-pay | Admitting: Family Medicine

## 2015-02-05 ENCOUNTER — Ambulatory Visit (HOSPITAL_COMMUNITY): Payer: Self-pay | Admitting: Psychology

## 2015-02-05 DIAGNOSIS — M810 Age-related osteoporosis without current pathological fracture: Secondary | ICD-10-CM

## 2015-02-05 DIAGNOSIS — Z1231 Encounter for screening mammogram for malignant neoplasm of breast: Secondary | ICD-10-CM

## 2015-02-12 NOTE — Progress Notes (Signed)
      PROGRESS NOTE   Patient:  Lauren [name]   DOB: 05-23-1951  MR Number: [id number]  Location: [facility] [address] [place] [address] Dept: [phone]  Start: 2 PM End: 3 PM  Provider/Observer:     Edgardo Roys PSYD  Chief Complaint:      Chief Complaint  Patient presents with  . Agitation  . Anxiety  . Depression    Reason For Service:     The patient has a long history of major depressive condition and ongoing underlying depressive disorder. She is a recovering alcoholic but has many many years this Friday. Significant symptoms of anxiety have played a major role in substance abuse in the past. Significant medical problems and her inability to work have complicated her situation. When all these difficulties are put in the context and relationship to potential workability it is clear that she is disabled not do not expect her to return to gainful employment.  Interventions Strategy:  Cognitive/behavioral psychotherapy  Participation Level:   Active  Participation Quality:  Appropriate      Behavioral Observation:  Well Groomed, Alert, and Appropriate.   Current Psychosocial Factors: The patient reports that she has been getting around spend time with some of her friends but ultimately ends up taking care of them or feeling guilty that she cannot do more. The patient reports things are going better at home with her husband .    Content of Session:   Review current symptoms and continue to work on therapeutic interventions for improved coping skills and strategies.  Current Status:   The patient reports that she has been doing much better than she was after her acute decompensation about 3 weeks ago. The patient reports that while she continues to have significant depression anxiety she has shown steady improvement persistent improvement.  Patient Progress:   Stable and  improving  Target Goals:   Reduce the frequency of depressive events including feelings of hopelessness and helplessness, anhedonia, social withdrawal, as well as significant anxiety symptoms related to social avoidance, fear of abandonment and fear of inability to manage and cope with her world. We will also look at continuing complete sobriety.  Last Reviewed:   10/21/2014  Goals Addressed Today:    Target goals addressed today have to do with issues of depression in particular feelings of helplessness and hopelessness.   Impression/Diagnosis:   The patient has a long history of depressive disorder and underlying anxiety disorder. She has had numerous medical issues including severe fibromyalgia and severe arthritis. She has been diagnosed with significant arthritis throughout her body and in particular her hands and her feet. Numerous events and left are overwhelmed and unable to work. The patient has had periodic times of improvement for primary symptoms but there are other times where the symptoms are so problematic that she has not been able to maintain gainful employment.  Diagnosis:    Axis I:  Mood disorder  Anxiety    Jordann Grime R, PsyD 02/12/2015     Seichi Kaufhold R, PsyD 02/12/2015

## 2015-02-13 ENCOUNTER — Ambulatory Visit (INDEPENDENT_AMBULATORY_CARE_PROVIDER_SITE_OTHER): Admitting: Psychology

## 2015-02-13 DIAGNOSIS — F419 Anxiety disorder, unspecified: Secondary | ICD-10-CM | POA: Diagnosis not present

## 2015-02-13 DIAGNOSIS — F332 Major depressive disorder, recurrent severe without psychotic features: Secondary | ICD-10-CM

## 2015-02-18 ENCOUNTER — Ambulatory Visit (HOSPITAL_COMMUNITY): Payer: Self-pay

## 2015-02-19 ENCOUNTER — Other Ambulatory Visit (INDEPENDENT_AMBULATORY_CARE_PROVIDER_SITE_OTHER): Payer: Self-pay | Admitting: Internal Medicine

## 2015-02-19 ENCOUNTER — Encounter (INDEPENDENT_AMBULATORY_CARE_PROVIDER_SITE_OTHER): Payer: Self-pay | Admitting: Internal Medicine

## 2015-02-19 ENCOUNTER — Ambulatory Visit (INDEPENDENT_AMBULATORY_CARE_PROVIDER_SITE_OTHER): Payer: Medicare Other | Admitting: Internal Medicine

## 2015-02-19 VITALS — BP 112/60 | HR 60 | Temp 98.0°F | Ht 66.0 in | Wt 136.4 lb

## 2015-02-19 DIAGNOSIS — B1920 Unspecified viral hepatitis C without hepatic coma: Secondary | ICD-10-CM | POA: Diagnosis not present

## 2015-02-19 NOTE — Patient Instructions (Addendum)
CBC with diff, Hepatic function, Hep C quaint in 6 weeks OV in 6 weeks.

## 2015-02-19 NOTE — Progress Notes (Addendum)
Subjective:    Patient ID: Lauren [name], female    DOB: [birth year], 64 y.o.   MRN: [id number]  HPI Here today for f/u of her Hepatitis C. She has cleared the virus. She started tx with Harvoni 12/12/2014.  This is week 10 for her. She will be treated for 12 weeks. She is Genotype 1A.  [place]  Elastrography: F2 an F3. Appetite is good. No weight loss. She denies any joint pain. No side effects from the medication. There is no depression.  She did receive Shingle shot 2 weeks ago.   Hepatic Function Panel     Component Value Date/Time   PROT 7.1 01/21/2015 1506   ALBUMIN 4.0 01/21/2015 1506   AST 16 01/21/2015 1506   ALT 17 01/21/2015 1506   ALKPHOS 56 01/21/2015 1506   BILITOT 0.2 01/21/2015 1506   BILIDIR 0.1 01/21/2015 1506   IBILI 0.1* 01/21/2015 1506   CBC    Component Value Date/Time   WBC 5.9 01/21/2015 1506   RBC 4.73 01/21/2015 1506   HGB 13.2 01/21/2015 1506   HCT 39.6 01/21/2015 1506   PLT 241 01/21/2015 1506   MCV 83.7 01/21/2015 1506   MCH 27.9 01/21/2015 1506   MCHC 33.3 01/21/2015 1506   RDW 12.9 01/21/2015 1506   LYMPHSABS 1.9 01/21/2015 1506   MONOABS 0.5 01/21/2015 1506   EOSABS 0.2 01/21/2015 1506   BASOSABS 0.1 01/21/2015 1506   01/21/2015 Hep C Quaint undetected.         Review of Systems Past Medical History  Diagnosis Date  . Depression   . Osteoarthritis   . Fibromyalgia   . IBS (irritable bowel syndrome)   . GERD (gastroesophageal reflux disease)   . Prediabetes   . Chronic pain   . Fibromyalgia   . Hepatitis C     Past Surgical History  Procedure Laterality Date  . Tonsillectomy    . Cholecystectomy    . Colonoscopy    . Upper gastrointestinal endoscopy    . Mandible surgery  09/06/1979  . Orif wrist fracture Right 07/23/2013    Procedure: OPEN REDUCTION INTERNAL FIXATION (ORIF) WRIST FRACTURE;  Surgeon: Roseanne Kaufman, MD;  Location: [place];  Service: Orthopedics;  Laterality: Right;  . Colonoscopy N/A 10/23/2014   Procedure: COLONOSCOPY;  Surgeon: Rogene Houston, MD;  [facility];  Service: Endoscopy;  Laterality: N/A;  1030  . Esophagogastroduodenoscopy N/A 10/23/2014    Procedure: ESOPHAGOGASTRODUODENOSCOPY (EGD);  Surgeon: Rogene Houston, MD;  [facility];  Service: Endoscopy;  Laterality: N/A;  . Total knee arthroplasty      2011 rt knee    Allergies  Allergen Reactions  . Elavil [Amitriptyline] Other (See Comments)    Vivid dreams and almost suicidal  . Flagyl [Metronidazole] Itching  . Vistaril [Hydroxyzine Hcl] Other (See Comments)    Got higher than a kite on too much of this.  [place] [Ziprasidone Hydrochloride]   . Lactose Intolerance (Gi)     Current Outpatient Prescriptions on File Prior to Visit  Medication Sig Dispense Refill  . albuterol (PROAIR HFA) 108 (90 BASE) MCG/ACT inhaler Inhale 2 puffs into the lungs every 4 (four) hours as needed for wheezing or shortness of breath.    Marland Kitchen aspirin 81 MG tablet Take 81 mg by mouth daily.    . calcium-vitamin D (OSCAL WITH D) 500-200 MG-UNIT per tablet Take 1 tablet by mouth 2 (two) times daily. For low calcium    .  diphenoxylate-atropine (LOMOTIL) 2.5-0.025 MG per tablet Take 1 tablet by mouth 4 (four) times daily as needed for diarrhea or loose stools. 30 tablet 0  . hyoscyamine (LEVSIN SL) 0.125 MG SL tablet Place 1 tablet (0.125 mg total) under the tongue 3 (three) times daily as needed. 90 tablet 2  . lamoTRIgine (LAMICTAL) 25 MG tablet Take 2 tablets (50 mg total) by mouth 2 (two) times daily. 120 tablet 2  . Ledipasvir-Sofosbuvir 90-400 MG TABS Take by mouth.    Marland Kitchen LORazepam (ATIVAN) 1 MG tablet Take 2 at night 60 tablet 2  . oxyCODONE-acetaminophen (PERCOCET) 10-325 MG per tablet Take 1 tablet by mouth 4 (four) times daily.  0  . pravastatin (PRAVACHOL) 20 MG tablet Take 1 tablet (20 mg total) by mouth daily. For high cholesterol 90 tablet 0  . promethazine (PHENERGAN) 25 MG tablet Take 25 mg by mouth every 6  (six) hours as needed for nausea or vomiting.    . ranitidine (ZANTAC) 150 MG tablet Take 1 tablet (150 mg total) by mouth 2 (two) times daily. 60 tablet 2  . tiZANidine (ZANAFLEX) 2 MG tablet Take by mouth every 6 (six) hours as needed for muscle spasms.    . traZODone (DESYREL) 50 MG tablet Take 1 tablet (50 mg total) by mouth at bedtime as needed. 30 tablet 2  . venlafaxine XR (EFFEXOR XR) 75 MG 24 hr capsule Take 1 capsule (75 mg total) by mouth daily. 30 capsule 2   No current facility-administered medications on file prior to visit.        Objective:   Physical Exam Blood pressure 112/60, pulse 60, temperature 98 F (36.7 C), height 5\' 6"  (1.676 m), weight 136 lb 6.4 oz (61.871 kg).  Alert and oriented. Skin warm and dry. Oral mucosa is moist.   . Sclera anicteric, conjunctivae is pink. Thyroid not enlarged. No cervical lymphadenopathy. Lungs clear. Heart regular rate and rhythm.  Abdomen is soft. Bowel sounds are positive. No hepatomegaly. No abdominal masses felt. No tenderness.  No edema to lower extremities. Patient is alert and oriented.       Assessment & Plan:  Hepatitis C. She is doing well. She has cleared the virus. She will go to lab and get a CBC, Hepatic function and Hep C quaint. OV in 6 weeks with a CBC, Hepatic function and Hep C quaint.

## 2015-02-20 ENCOUNTER — Telehealth (INDEPENDENT_AMBULATORY_CARE_PROVIDER_SITE_OTHER): Payer: Self-pay | Admitting: *Deleted

## 2015-02-20 DIAGNOSIS — B182 Chronic viral hepatitis C: Secondary | ICD-10-CM

## 2015-02-20 LAB — HEPATIC FUNCTION PANEL
ALK PHOS: 60 U/L (ref 39–117)
ALT: 14 U/L (ref 0–35)
AST: 21 U/L (ref 0–37)
Albumin: 4.2 g/dL (ref 3.5–5.2)
BILIRUBIN INDIRECT: 0.4 mg/dL (ref 0.2–1.2)
Bilirubin, Direct: 0.1 mg/dL (ref 0.0–0.3)
Total Bilirubin: 0.5 mg/dL (ref 0.2–1.2)
Total Protein: 7.5 g/dL (ref 6.0–8.3)

## 2015-02-20 LAB — CBC WITH DIFFERENTIAL/PLATELET
BASOS ABS: 0.1 10*3/uL (ref 0.0–0.1)
BASOS PCT: 1 % (ref 0–1)
Eosinophils Absolute: 0.3 10*3/uL (ref 0.0–0.7)
Eosinophils Relative: 4 % (ref 0–5)
HCT: 42.1 % (ref 36.0–46.0)
Hemoglobin: 14 g/dL (ref 12.0–15.0)
LYMPHS PCT: 34 % (ref 12–46)
Lymphs Abs: 2.2 10*3/uL (ref 0.7–4.0)
MCH: 28.3 pg (ref 26.0–34.0)
MCHC: 33.3 g/dL (ref 30.0–36.0)
MCV: 85.2 fL (ref 78.0–100.0)
MONO ABS: 0.5 10*3/uL (ref 0.1–1.0)
MPV: 10.3 fL (ref 8.6–12.4)
Monocytes Relative: 7 % (ref 3–12)
NEUTROS ABS: 3.5 10*3/uL (ref 1.7–7.7)
Neutrophils Relative %: 54 % (ref 43–77)
Platelets: 241 10*3/uL (ref 150–400)
RBC: 4.94 MIL/uL (ref 3.87–5.11)
RDW: 13.6 % (ref 11.5–15.5)
WBC: 6.5 10*3/uL (ref 4.0–10.5)

## 2015-02-20 LAB — HEPATITIS C RNA QUANTITATIVE: HCV Quantitative: NOT DETECTED IU/mL (ref <15)

## 2015-02-20 NOTE — Telephone Encounter (Signed)
.  Per Lelon Perla patient will need to have lab work in 6 weeks.

## 2015-02-25 ENCOUNTER — Other Ambulatory Visit (INDEPENDENT_AMBULATORY_CARE_PROVIDER_SITE_OTHER): Payer: Self-pay | Admitting: *Deleted

## 2015-02-25 ENCOUNTER — Encounter (INDEPENDENT_AMBULATORY_CARE_PROVIDER_SITE_OTHER): Payer: Self-pay | Admitting: *Deleted

## 2015-02-25 DIAGNOSIS — B182 Chronic viral hepatitis C: Secondary | ICD-10-CM

## 2015-03-12 ENCOUNTER — Ambulatory Visit (HOSPITAL_COMMUNITY): Payer: Self-pay | Admitting: Psychology

## 2015-04-02 ENCOUNTER — Ambulatory Visit (INDEPENDENT_AMBULATORY_CARE_PROVIDER_SITE_OTHER): Payer: Self-pay | Admitting: Internal Medicine

## 2015-04-05 ENCOUNTER — Other Ambulatory Visit (HOSPITAL_COMMUNITY): Payer: Self-pay | Admitting: Psychiatry

## 2015-04-08 ENCOUNTER — Ambulatory Visit (INDEPENDENT_AMBULATORY_CARE_PROVIDER_SITE_OTHER): Payer: Medicare Other | Admitting: Internal Medicine

## 2015-04-08 ENCOUNTER — Encounter (INDEPENDENT_AMBULATORY_CARE_PROVIDER_SITE_OTHER): Payer: Self-pay | Admitting: Internal Medicine

## 2015-04-08 VITALS — BP 112/62 | HR 72 | Temp 97.9°F | Ht 67.0 in | Wt 140.3 lb

## 2015-04-08 DIAGNOSIS — B1710 Acute hepatitis C without hepatic coma: Secondary | ICD-10-CM

## 2015-04-08 LAB — CBC WITH DIFFERENTIAL/PLATELET
BASOS ABS: 0 10*3/uL (ref 0.0–0.1)
Basophils Relative: 1 % (ref 0–1)
EOS PCT: 3 % (ref 0–5)
Eosinophils Absolute: 0.1 10*3/uL (ref 0.0–0.7)
HCT: 37.6 % (ref 36.0–46.0)
Hemoglobin: 12.5 g/dL (ref 12.0–15.0)
LYMPHS PCT: 41 % (ref 12–46)
Lymphs Abs: 2 10*3/uL (ref 0.7–4.0)
MCH: 28 pg (ref 26.0–34.0)
MCHC: 33.2 g/dL (ref 30.0–36.0)
MCV: 84.3 fL (ref 78.0–100.0)
MPV: 9.8 fL (ref 8.6–12.4)
Monocytes Absolute: 0.4 10*3/uL (ref 0.1–1.0)
Monocytes Relative: 9 % (ref 3–12)
NEUTROS ABS: 2.2 10*3/uL (ref 1.7–7.7)
NEUTROS PCT: 46 % (ref 43–77)
PLATELETS: 220 10*3/uL (ref 150–400)
RBC: 4.46 MIL/uL (ref 3.87–5.11)
RDW: 13.6 % (ref 11.5–15.5)
WBC: 4.8 10*3/uL (ref 4.0–10.5)

## 2015-04-08 NOTE — Progress Notes (Addendum)
Subjective:    Patient ID: Lauren [name], female    DOB: 24-May-1951, 64 y.o.   MRN: [id number]  HPI  Here today for f/u of her Hepatitis C. She is genotype 1A. Elastrogrpahy F2 and F3.    Liver biopsy March 2012: Grade 1, Stage 0.   She started tx with Harvoni on 12/12/2014 x 12 weeks. She was successfully treated, She is doing good. No complaints. Appetite is good. No weight loss. No rash. No joint pain. 02/19/2015 Hep C quaint undetected CBC    Component Value Date/Time   WBC 6.5 02/19/2015 1255   RBC 4.94 02/19/2015 1255   HGB 14.0 02/19/2015 1255   HCT 42.1 02/19/2015 1255   PLT 241 02/19/2015 1255   MCV 85.2 02/19/2015 1255   MCH 28.3 02/19/2015 1255   MCHC 33.3 02/19/2015 1255   RDW 13.6 02/19/2015 1255   LYMPHSABS 2.2 02/19/2015 1255   MONOABS 0.5 02/19/2015 1255   EOSABS 0.3 02/19/2015 1255   BASOSABS 0.1 02/19/2015 1255    Hepatic Function Latest Ref Rng 02/19/2015 01/21/2015 11/07/2014  Total Protein 6.0 - 8.3 g/dL 7.5 7.1 7.2  Albumin 3.5 - 5.2 g/dL 4.2 4.0 4.3  AST 0 - 37 U/L 21 16 31   ALT 0 - 35 U/L 14 17 34  Alk Phosphatase 39 - 117 U/L 60 56 58  Total Bilirubin 0.2 - 1.2 mg/dL 0.5 0.2 0.4  Bilirubin, Direct 0.0 - 0.3 mg/dL 0.1 0.1 0.1    Hep C quaint undetected.   Review of Systems     Past Medical History  Diagnosis Date  . Depression   . Osteoarthritis   . Fibromyalgia   . IBS (irritable bowel syndrome)   . GERD (gastroesophageal reflux disease)   . Prediabetes   . Chronic pain   . Fibromyalgia   . Hepatitis C     Past Surgical History  Procedure Laterality Date  . Tonsillectomy    . Cholecystectomy    . Colonoscopy    . Upper gastrointestinal endoscopy    . Mandible surgery  09/06/1979  . Orif wrist fracture Right 07/23/2013    Procedure: OPEN REDUCTION INTERNAL FIXATION (ORIF) WRIST FRACTURE;  Surgeon: Roseanne Kaufman, MD;  Location: [facility];  Service: Orthopedics;  Laterality: Right;  . Colonoscopy N/A 10/23/2014    Procedure:  COLONOSCOPY;  Surgeon: Rogene Houston, MD;  [facility];  Service: Endoscopy;  Laterality: N/A;  1030  . Esophagogastroduodenoscopy N/A 10/23/2014    Procedure: ESOPHAGOGASTRODUODENOSCOPY (EGD);  Surgeon: Rogene Houston, MD;  [facility];  Service: Endoscopy;  Laterality: N/A;  . Total knee arthroplasty      2011 rt knee    Allergies  Allergen Reactions  . Elavil [Amitriptyline] Other (See Comments)    Vivid dreams and almost suicidal  . Flagyl [Metronidazole] Itching  . Vistaril [Hydroxyzine Hcl] Other (See Comments)    Got higher than a kite on too much of this.  Lindajo Royal [Ziprasidone Hydrochloride]   . Lactose Intolerance (Gi)     Current Outpatient Prescriptions on File Prior to Visit  Medication Sig Dispense Refill  . albuterol (PROAIR HFA) 108 (90 BASE) MCG/ACT inhaler Inhale 2 puffs into the lungs every 4 (four) hours as needed for wheezing or shortness of breath.    Marland Kitchen aspirin 81 MG tablet Take 81 mg by mouth daily.    . calcium-vitamin D (OSCAL WITH D) 500-200 MG-UNIT per tablet Take 1 tablet by mouth 2 (two) times  daily. For low calcium    . diphenoxylate-atropine (LOMOTIL) 2.5-0.025 MG per tablet Take 1 tablet by mouth 4 (four) times daily as needed for diarrhea or loose stools. 30 tablet 0  . hyoscyamine (LEVSIN SL) 0.125 MG SL tablet Place 1 tablet (0.125 mg total) under the tongue 3 (three) times daily as needed. 90 tablet 2  . lamoTRIgine (LAMICTAL) 25 MG tablet Take 2 tablets (50 mg total) by mouth 2 (two) times daily. 120 tablet 2  . LORazepam (ATIVAN) 1 MG tablet Take 2 at night 60 tablet 2  . oxyCODONE-acetaminophen (PERCOCET) 10-325 MG per tablet Take 1 tablet by mouth 4 (four) times daily.  0  . pravastatin (PRAVACHOL) 20 MG tablet Take 1 tablet (20 mg total) by mouth daily. For high cholesterol 90 tablet 0  . promethazine (PHENERGAN) 25 MG tablet Take 25 mg by mouth every 6 (six) hours as needed for nausea or vomiting.    . ranitidine  (ZANTAC) 150 MG tablet Take 1 tablet (150 mg total) by mouth 2 (two) times daily. 60 tablet 2  . tiZANidine (ZANAFLEX) 2 MG tablet Take by mouth every 6 (six) hours as needed for muscle spasms.    . traZODone (DESYREL) 50 MG tablet Take 1 tablet (50 mg total) by mouth at bedtime as needed. 30 tablet 2  . venlafaxine XR (EFFEXOR XR) 75 MG 24 hr capsule Take 1 capsule (75 mg total) by mouth daily. 30 capsule 2   No current facility-administered medications on file prior to visit.    .cbc Objective:   Physical ExamBlood pressure 112/62, pulse 72, temperature 97.9 F (36.6 C), height 5' 7"  (1.702 m), weight 140 lb 4.8 oz (63.64 kg).  Alert and oriented. Skin warm and dry. Oral mucosa is moist.   . Sclera anicteric, conjunctivae is pink. Thyroid not enlarged. No cervical lymphadenopathy. Lungs clear. Heart regular rate and rhythm.  Abdomen is soft. Bowel sounds are positive. No hepatomegaly. No abdominal masses felt. No tenderness.  No edema to lower extremities.         Assessment & Plan:  Hepatitis C. CBC with diff. Hepatic profile, Hep C quaint. OV in 6 months.

## 2015-04-08 NOTE — Patient Instructions (Signed)
OV 6 months.  

## 2015-04-09 LAB — HEPATIC FUNCTION PANEL
ALT: 13 U/L (ref 6–29)
AST: 19 U/L (ref 10–35)
Albumin: 4 g/dL (ref 3.6–5.1)
Alkaline Phosphatase: 48 U/L (ref 33–130)
BILIRUBIN DIRECT: 0.1 mg/dL (ref <=0.2)
Indirect Bilirubin: 0.2 mg/dL (ref 0.2–1.2)
TOTAL PROTEIN: 6.8 g/dL (ref 6.1–8.1)
Total Bilirubin: 0.3 mg/dL (ref 0.2–1.2)

## 2015-04-09 LAB — HEPATITIS C RNA QUANTITATIVE: HCV QUANT: NOT DETECTED [IU]/mL (ref <15)

## 2015-04-09 LAB — AFP TUMOR MARKER: AFP-Tumor Marker: 3.4 ng/mL (ref <6.1)

## 2015-04-17 ENCOUNTER — Ambulatory Visit (INDEPENDENT_AMBULATORY_CARE_PROVIDER_SITE_OTHER): Admitting: Psychology

## 2015-04-17 ENCOUNTER — Telehealth (HOSPITAL_COMMUNITY): Payer: Self-pay | Admitting: *Deleted

## 2015-04-17 DIAGNOSIS — F332 Major depressive disorder, recurrent severe without psychotic features: Secondary | ICD-10-CM | POA: Diagnosis not present

## 2015-04-17 DIAGNOSIS — F419 Anxiety disorder, unspecified: Secondary | ICD-10-CM

## 2015-04-24 ENCOUNTER — Ambulatory Visit (HOSPITAL_COMMUNITY): Payer: Self-pay | Admitting: Psychiatry

## 2015-04-28 ENCOUNTER — Ambulatory Visit (HOSPITAL_COMMUNITY): Payer: Self-pay | Admitting: Psychology

## 2015-04-29 ENCOUNTER — Ambulatory Visit (INDEPENDENT_AMBULATORY_CARE_PROVIDER_SITE_OTHER): Payer: 59 | Admitting: Psychology

## 2015-04-29 DIAGNOSIS — F419 Anxiety disorder, unspecified: Secondary | ICD-10-CM | POA: Diagnosis not present

## 2015-04-29 DIAGNOSIS — F332 Major depressive disorder, recurrent severe without psychotic features: Secondary | ICD-10-CM

## 2015-04-30 ENCOUNTER — Encounter (HOSPITAL_COMMUNITY): Payer: Self-pay | Admitting: Psychology

## 2015-04-30 NOTE — Progress Notes (Signed)
      PROGRESS NOTE   Patient:  Lauren [name]   DOB: 1951/01/26  MR Number: [id number]  Location: [facility] [address] [place] [address] Dept: [phone]  Start: 3 PM End: 4 PM  Provider/Observer:     Edgardo Roys PSYD  Chief Complaint:      Chief Complaint  Patient presents with  . Anxiety  . Depression  . Stress  . Trauma    Reason For Service:     The patient has a long history of major depressive condition and ongoing underlying depressive disorder. She is a recovering alcoholic but has many many years this Friday. Significant symptoms of anxiety have played a major role in substance abuse in the past. Significant medical problems and her inability to work have complicated her situation. When all these difficulties are put in the context and relationship to potential workability it is clear that she is disabled not do not expect her to return to gainful employment.  Interventions Strategy:  Cognitive/behavioral psychotherapy  Participation Level:   Active  Participation Quality:  Appropriate      Behavioral Observation:  Well Groomed, Alert, and Appropriate.   Current Psychosocial Factors: The patient reports that she has continued to do very poorly with regard to her anxiety and coping skills. The patient's personality styles complicate her progress greatly. She has been able to stay out of the hospital but has continued to very poorly with regard to life events and how she is cope.    Content of Session:   Review current symptoms and continue to work on therapeutic interventions for improved coping skills and strategies.  Current Status:   The patient reports that she has   Patient Progress:   Stable and improving  Target Goals:   Reduce the frequency of depressive events including feelings of hopelessness and helplessness, anhedonia, social withdrawal, as well  as significant anxiety symptoms related to social avoidance, fear of abandonment and fear of inability to manage and cope with her world. We will also look at continuing complete sobriety.  Last Reviewed:   04/29/2015  Goals Addressed Today:    Target goals addressed today have to do with issues of depression in particular feelings of helplessness and hopelessness.   Impression/Diagnosis:   The patient has a long history of depressive disorder and underlying anxiety disorder. She has had numerous medical issues including severe fibromyalgia and severe arthritis. She has been diagnosed with significant arthritis throughout her body and in particular her hands and her feet. Numerous events and left are overwhelmed and unable to work. The patient has had periodic times of improvement for primary symptoms but there are other times where the symptoms are so problematic that she has not been able to maintain gainful employment.  Diagnosis:    Axis I:  Major depressive disorder, recurrent, severe without psychotic features  Anxiety    RODENBOUGH,JOHN R, PsyD 04/30/2015

## 2015-05-04 ENCOUNTER — Encounter (HOSPITAL_COMMUNITY): Payer: Self-pay | Admitting: Psychiatry

## 2015-05-04 ENCOUNTER — Encounter: Payer: Self-pay | Admitting: Cardiovascular Disease

## 2015-05-04 ENCOUNTER — Ambulatory Visit (INDEPENDENT_AMBULATORY_CARE_PROVIDER_SITE_OTHER): Payer: 59 | Admitting: Psychiatry

## 2015-05-04 VITALS — BP 102/74 | HR 88 | Ht 67.0 in | Wt 140.4 lb

## 2015-05-04 DIAGNOSIS — F1021 Alcohol dependence, in remission: Secondary | ICD-10-CM | POA: Diagnosis not present

## 2015-05-04 DIAGNOSIS — R52 Pain, unspecified: Secondary | ICD-10-CM

## 2015-05-04 DIAGNOSIS — F332 Major depressive disorder, recurrent severe without psychotic features: Secondary | ICD-10-CM

## 2015-05-04 DIAGNOSIS — G4700 Insomnia, unspecified: Secondary | ICD-10-CM | POA: Diagnosis not present

## 2015-05-04 DIAGNOSIS — F329 Major depressive disorder, single episode, unspecified: Secondary | ICD-10-CM | POA: Diagnosis not present

## 2015-05-04 DIAGNOSIS — F419 Anxiety disorder, unspecified: Secondary | ICD-10-CM

## 2015-05-04 MED ORDER — LAMOTRIGINE 25 MG PO TABS: 50.0000 mg | ORAL_TABLET | Freq: Two times a day (BID) | ORAL | Status: DC

## 2015-05-04 MED ORDER — LORAZEPAM 1 MG PO TABS: 1.0000 mg | ORAL_TABLET | Freq: Three times a day (TID) | ORAL | Status: DC

## 2015-05-04 MED ORDER — VENLAFAXINE HCL ER 75 MG PO CP24: 75.0000 mg | ORAL_CAPSULE | Freq: Every day | ORAL | Status: DC

## 2015-05-04 NOTE — Progress Notes (Signed)
Patient ID: Lauren [name], female   DOB: Jan 06, 1951, 64 y.o.   MRN: [id number] Patient ID: Lauren [name], female   DOB: 18-May-1951, 64 y.o.   MRN: [id number] Patient ID: Lauren [name], female   DOB: 1950/10/26, 64 y.o.   MRN: [id number] Patient ID: Lauren [name], female   DOB: [birth year], 64 y.o.   MRN: [id number] Patient ID: Lauren [name], female   DOB: [birth year], 64 y.o.   MRN: [id number] Patient ID: Lauren [name], female   DOB: August 28, 1951, 64 y.o.   MRN: [id number] Patient ID: Lauren [name], female   DOB: Sep 06, 1950, 64 y.o.   MRN: [id number] Patient ID: Lauren [name], female   DOB: 1951-02-01, 64 y.o.   MRN: [id number] Patient ID: Lauren [name], female   DOB: 1951/08/21, 64 y.o.   MRN: [id number] Patient ID: Lauren [name], female   DOB: 11/22/1950, 64 y.o.   MRN: [id number] Patient ID: Lauren [name], female   DOB: July 30, 1951, 64 y.o.   MRN: [id number] Patient ID: Lauren [name], female   DOB: 12-26-1950, 64 y.o.   MRN: [id number] Patient ID: Lauren [name], female   DOB: Oct 25, 1950, 63 y.o.   MRN: [id number] Patient ID: Lauren [name], female   DOB: 06-11-1951, 64 y.o.   MRN: [id number] Patient ID: Lauren [name], female   DOB: Jun 01, 1951, 64 y.o.   MRN: [id number] Lauren [name] [id number] Progress Note Lauren [name] MRN: [id number] DOB: [birth year] Age: 64 y.o.  Date: 05/04/2015  Chief Complaint: Chief Complaint  Patient presents with  . Depression  . Manic Behavior  . Follow-up   Subjective: I'm more anxious lately  History of presenting illness This patient is a 64 year old married white female who lives with her husband. She has one stepchild. Her step son killed himself 7 years ago at age 62. He was an alcoholic. The patient is on disability for fibromyalgia  The patient has a long history of depression. She was last hospitalized in 2010 after she took too many pain pills and got confused. She use to drink heavily but has been sober 23 years and is very active in [employer] and  church. For the most part she feels her mood is stable. She sleeping pretty well. Her energy is good. She has a hard time functioning without Ativan but she only usually takes one pill a day side think this is reasonable. She tried a higher dose of Cymbalta but it made her manic and revved up  The patient returns after 3 months. She's been attending a lot of funerals lately  for people in her church and also for her ex-mother-in-law. This is gotten her down and she's become increasingly anxious. She states she is increased her Ativan to 1 mg 3 times a day which is helped. She's been staying in more because she feels self-conscious about going out for no apparent reason. She has been getting out and walking her dogs. She denies being severely depressed or manic or suicidal. Allergies: Allergies  Allergen Reactions  . Elavil [Amitriptyline] Other (See Comments)    Vivid dreams and almost suicidal  . Flagyl [Metronidazole] Itching  . Vistaril [Hydroxyzine Hcl] Other (See Comments)    Got higher than a kite on too much of this.  Lindajo Royal [Ziprasidone Hydrochloride]   . Lactose Intolerance (Gi)   Medical History: Past Medical History  Diagnosis Date  . Depression   . Osteoarthritis   . Fibromyalgia   . IBS (irritable bowel  syndrome)   . GERD (gastroesophageal reflux disease)   . Prediabetes   . Chronic pain   . Fibromyalgia   . Hepatitis C   Surgical History: Past Surgical History  Procedure Laterality Date  . Tonsillectomy    . Cholecystectomy    . Colonoscopy    . Upper gastrointestinal endoscopy    . Mandible surgery  09/06/1979  . Orif wrist fracture Right 07/23/2013    Procedure: OPEN REDUCTION INTERNAL FIXATION (ORIF) WRIST FRACTURE;  Surgeon: Roseanne Kaufman, MD;  Location: [facility];  Service: Orthopedics;  Laterality: Right;  . Colonoscopy N/A 10/23/2014    Procedure: COLONOSCOPY;  Surgeon: Rogene Houston, MD;  [facility];  Service: Endoscopy;  Laterality: N/A;  1030  .  Esophagogastroduodenoscopy N/A 10/23/2014    Procedure: ESOPHAGOGASTRODUODENOSCOPY (EGD);  Surgeon: Rogene Houston, MD;  [facility];  Service: Endoscopy;  Laterality: N/A;  . Total knee arthroplasty      2011 rt knee  Family History family history includes Alcohol abuse in her cousin, father, maternal grandfather, maternal grandmother, paternal grandfather, paternal grandmother, and paternal uncle; Anxiety disorder in her maternal uncle and paternal uncle; Depression in her father; Diabetes in her brother, brother, and father; Healthy in her brother; Heart disease in her brother; Irritable bowel syndrome in her mother; OCD in her brother and brother; Sexual abuse in her mother; Stroke in her father and mother. There is no history of ADD / ADHD, Bipolar disorder, Dementia, Drug abuse, Paranoid behavior, Schizophrenia, Seizures, or Physical abuse. Reviewed al this again to day in the appointment and nothing has changed  Mental status examination Patient is neatly dressed and groomed.  She she states her mood is anxious and she is a bit tearful about her  but brightened up after a while She denies paranoia and she denies any hallucination. She denies suicidal and homicidal ideation her speech is clear and understandable, she no longer has sped up speech or agitation She denies auditory and visual hallucinations or paranoia Her attention and concentration is fair. Her thought process is normal  She's alert and oriented x3 and her insight judgment and impulse control are good  Lab Results:  Results for orders placed or performed in visit on 04/08/15 (from the past 8736 hour(s))  CBC with Differential/Platelet   Collection Time: 04/08/15  8:40 AM  Result Value Ref Range   WBC 4.8 4.0 - 10.5 K/uL   RBC 4.46 3.87 - 5.11 MIL/uL   Hemoglobin 12.5 12.0 - 15.0 g/dL   HCT 37.6 36.0 - 46.0 %   MCV 84.3 78.0 - 100.0 fL   MCH 28.0 26.0 - 34.0 pg   MCHC 33.2 30.0 - 36.0 g/dL   RDW 13.6 11.5 - 15.5  %   Platelets 220 150 - 400 K/uL   MPV 9.8 8.6 - 12.4 fL   Neutrophils Relative % 46 43 - 77 %   Neutro Abs 2.2 1.7 - 7.7 K/uL   Lymphocytes Relative 41 12 - 46 %   Lymphs Abs 2.0 0.7 - 4.0 K/uL   Monocytes Relative 9 3 - 12 %   Monocytes Absolute 0.4 0.1 - 1.0 K/uL   Eosinophils Relative 3 0 - 5 %   Eosinophils Absolute 0.1 0.0 - 0.7 K/uL   Basophils Relative 1 0 - 1 %   Basophils Absolute 0.0 0.0 - 0.1 K/uL   Smear Review Criteria for review not met   Hepatic function panel   Collection Time: 04/08/15  8:40 AM  Result Value Ref Range   Total Bilirubin 0.3 0.2 - 1.2 mg/dL   Bilirubin, Direct 0.1 <=0.2 mg/dL   Indirect Bilirubin 0.2 0.2 - 1.2 mg/dL   Alkaline Phosphatase 48 33 - 130 U/L   AST 19 10 - 35 U/L   ALT 13 6 - 29 U/L   Total Protein 6.8 6.1 - 8.1 g/dL   Albumin 4.0 3.6 - 5.1 g/dL  Hepatitis C RNA quantitative   Collection Time: 04/08/15  8:40 AM  Result Value Ref Range   HCV Quantitative Not Detected <15 IU/mL   HCV Quantitative Log NOT CALC <1.18 log 10  AFP tumor marker   Collection Time: 04/08/15  9:22 AM  Result Value Ref Range   AFP-Tumor Marker 3.4 <6.1 ng/mL  Results for orders placed or performed in visit on 02/19/15 (from the past 8736 hour(s))  Hepatic function panel   Collection Time: 02/19/15 12:57 PM  Result Value Ref Range   Total Bilirubin 0.5 0.2 - 1.2 mg/dL   Bilirubin, Direct 0.1 0.0 - 0.3 mg/dL   Indirect Bilirubin 0.4 0.2 - 1.2 mg/dL   Alkaline Phosphatase 60 39 - 117 U/L   AST 21 0 - 37 U/L   ALT 14 0 - 35 U/L   Total Protein 7.5 6.0 - 8.3 g/dL   Albumin 4.2 3.5 - 5.2 g/dL  Results for orders placed or performed in visit on 01/28/15 (from the past 8736 hour(s))  CBC with Differential/Platelet   Collection Time: 02/19/15 12:55 PM  Result Value Ref Range   WBC 6.5 4.0 - 10.5 K/uL   RBC 4.94 3.87 - 5.11 MIL/uL   Hemoglobin 14.0 12.0 - 15.0 g/dL   HCT 42.1 36.0 - 46.0 %   MCV 85.2 78.0 - 100.0 fL   MCH 28.3 26.0 - 34.0 pg   MCHC 33.3  30.0 - 36.0 g/dL   RDW 13.6 11.5 - 15.5 %   Platelets 241 150 - 400 K/uL   MPV 10.3 8.6 - 12.4 fL   Neutrophils Relative % 54 43 - 77 %   Neutro Abs 3.5 1.7 - 7.7 K/uL   Lymphocytes Relative 34 12 - 46 %   Lymphs Abs 2.2 0.7 - 4.0 K/uL   Monocytes Relative 7 3 - 12 %   Monocytes Absolute 0.5 0.1 - 1.0 K/uL   Eosinophils Relative 4 0 - 5 %   Eosinophils Absolute 0.3 0.0 - 0.7 K/uL   Basophils Relative 1 0 - 1 %   Basophils Absolute 0.1 0.0 - 0.1 K/uL   Smear Review Criteria for review not met   Hepatitis C RNA quantitative   Collection Time: 02/19/15 12:59 PM  Result Value Ref Range   HCV Quantitative Not Detected <15 IU/mL   HCV Quantitative Log NOT CALC <1.18 log 10  Results for orders placed or performed in visit on 01/21/15 (from the past 8736 hour(s))  CBC with Differential/Platelet   Collection Time: 01/21/15  3:06 PM  Result Value Ref Range   WBC 5.9 4.0 - 10.5 K/uL   RBC 4.73 3.87 - 5.11 MIL/uL   Hemoglobin 13.2 12.0 - 15.0 g/dL   HCT 39.6 36.0 - 46.0 %   MCV 83.7 78.0 - 100.0 fL   MCH 27.9 26.0 - 34.0 pg   MCHC 33.3 30.0 - 36.0 g/dL   RDW 12.9 11.5 - 15.5 %   Platelets 241 150 - 400 K/uL   MPV 10.7 8.6 - 12.4 fL   Neutrophils  Relative % 54 43 - 77 %   Neutro Abs 3.2 1.7 - 7.7 K/uL   Lymphocytes Relative 32 12 - 46 %   Lymphs Abs 1.9 0.7 - 4.0 K/uL   Monocytes Relative 9 3 - 12 %   Monocytes Absolute 0.5 0.1 - 1.0 K/uL   Eosinophils Relative 4 0 - 5 %   Eosinophils Absolute 0.2 0.0 - 0.7 K/uL   Basophils Relative 1 0 - 1 %   Basophils Absolute 0.1 0.0 - 0.1 K/uL   Smear Review Criteria for review not met   Hepatic function panel   Collection Time: 01/21/15  3:06 PM  Result Value Ref Range   Total Bilirubin 0.2 0.2 - 1.2 mg/dL   Bilirubin, Direct 0.1 0.0 - 0.3 mg/dL   Indirect Bilirubin 0.1 (L) 0.2 - 1.2 mg/dL   Alkaline Phosphatase 56 39 - 117 U/L   AST 16 0 - 37 U/L   ALT 17 0 - 35 U/L   Total Protein 7.1 6.0 - 8.3 g/dL   Albumin 4.0 3.5 - 5.2 g/dL   Hepatitis C RNA quantitative   Collection Time: 01/21/15  3:06 PM  Result Value Ref Range   HCV Quantitative Not Detected <15 IU/mL   HCV Quantitative Log NOT CALC <1.18 log 10  Results for orders placed or performed in visit on 11/03/14 (from the past 8736 hour(s))  Hepatic function panel   Collection Time: 11/07/14  4:03 PM  Result Value Ref Range   Total Bilirubin 0.4 0.2 - 1.2 mg/dL   Bilirubin, Direct 0.1 0.0 - 0.3 mg/dL   Indirect Bilirubin 0.3 0.2 - 1.2 mg/dL   Alkaline Phosphatase 58 39 - 117 U/L   AST 31 0 - 37 U/L   ALT 34 0 - 35 U/L   Total Protein 7.2 6.0 - 8.3 g/dL   Albumin 4.3 3.5 - 5.2 g/dL  Hepatitis C RNA quantitative   Collection Time: 11/07/14  4:03 PM  Result Value Ref Range   HCV Quantitative [id number] (H) <15 IU/mL   HCV Quantitative Log 6.76 (H) <1.18 log 10  Hepatitis C genotype   Collection Time: 11/07/14  4:03 PM  Result Value Ref Range   HCV Genotype 1a   Results for orders placed or performed in visit on 09/11/14 (from the past 8736 hour(s))  H. pylori antibody, IgG   Collection Time: 09/11/14  4:16 PM  Result Value Ref Range   H Pylori IgG 0.52 ISR  Family doctor is following her labs  Assessment Axis I Maj. depressive disorder, insomnia, pain issues, HIstory of alcoholism. Axis II deferred Axis III see medical history Axis IV mild to moderate  Plan: She will continue Lamictal 50 mg twice a day for mood stabilization. She'll continue Effexor XR to 75 mg daily for depression. She will also continue Ativan but increase the dose to 1 mg 3 times a day for anxiety. She is no longer using trazodone She'll return in 2 months  MEDICATIONS this encounter: Meds ordered this encounter  Medications  . venlafaxine XR (EFFEXOR XR) 75 MG 24 hr capsule    Sig: Take 1 capsule (75 mg total) by mouth daily.    Dispense:  30 capsule    Refill:  2  . lamoTRIgine (LAMICTAL) 25 MG tablet    Sig: Take 2 tablets (50 mg total) by mouth 2 (two) times daily.     Dispense:  120 tablet    Refill:  2  . LORazepam (ATIVAN) 1 MG  tablet    Sig: Take 1 tablet (1 mg total) by mouth 3 (three) times daily.    Dispense:  90 tablet    Refill:  2    Medical Decision Making Problem Points:  Established problem, worsening (2), Review of last therapy session (1) and Review of psycho-social stressors (1) Data Points:  Review or order clinical lab tests (1) Review of medication regiment & side effects (2) Review of new medications or change in dosage (2)  I certify that outpatient services furnished can reasonably be expected to improve the patient's condition.   Levonne Spiller, MD

## 2015-05-15 ENCOUNTER — Ambulatory Visit (INDEPENDENT_AMBULATORY_CARE_PROVIDER_SITE_OTHER): Payer: 59 | Admitting: Psychology

## 2015-05-15 DIAGNOSIS — F332 Major depressive disorder, recurrent severe without psychotic features: Secondary | ICD-10-CM | POA: Diagnosis not present

## 2015-05-15 DIAGNOSIS — F419 Anxiety disorder, unspecified: Secondary | ICD-10-CM

## 2015-06-01 ENCOUNTER — Ambulatory Visit (INDEPENDENT_AMBULATORY_CARE_PROVIDER_SITE_OTHER): Payer: 59 | Admitting: Psychology

## 2015-06-01 DIAGNOSIS — F332 Major depressive disorder, recurrent severe without psychotic features: Secondary | ICD-10-CM | POA: Diagnosis not present

## 2015-06-01 DIAGNOSIS — F419 Anxiety disorder, unspecified: Secondary | ICD-10-CM | POA: Diagnosis not present

## 2015-06-02 ENCOUNTER — Encounter (HOSPITAL_COMMUNITY): Payer: Self-pay | Admitting: Psychology

## 2015-06-02 NOTE — Progress Notes (Signed)
      PROGRESS NOTE   Patient:  Lauren [name]   DOB: 03/17/1951  MR Number: [id number]  Location: [facility] [address] [place] [address] Dept: [phone]  Start: 4 PM End: 5 PM  Provider/Observer:     Edgardo Roys PSYD  Chief Complaint:      Chief Complaint  Patient presents with  . Agitation  . Anxiety  . Stress    Reason For Service:     The patient has a long history of major depressive condition and ongoing underlying depressive disorder. She is a recovering alcoholic but has many many years this Friday. Significant symptoms of anxiety have played a major role in substance abuse in the past. Significant medical problems and her inability to work have complicated her situation. When all these difficulties are put in the context and relationship to potential workability it is clear that she is disabled not do not expect her to return to gainful employment.  Interventions Strategy:  Cognitive/behavioral psychotherapy  Participation Level:   Active  Participation Quality:  Appropriate      Behavioral Observation:  Well Groomed, Alert, and Appropriate.   Current Psychosocial Factors: The patient reports that she has been overwhelmed by even the slightest psychological stressor recently. The patient reports that she continues to feel overwhelmed by a number of issues and has not been coping very well..    Content of Session:   Review current symptoms and continue to work on therapeutic interventions for improved coping skills and strategies.  Current Status:   The patient reports that she has had more anxiety and for a sustained period of time lately. The patient reports that she is not sure what was told that she continues to not do very well at all. We continue to work on coping skills and strategies and she is trying her therapeutic interventions but reports that she  continues to be quite agitated and overwhelmed with anxiety   Patient Progress:   Stable and improving  Target Goals:   Reduce the frequency of depressive events including feelings of hopelessness and helplessness, anhedonia, social withdrawal, as well as significant anxiety symptoms related to social avoidance, fear of abandonment and fear of inability to manage and cope with her world. We will also look at continuing complete sobriety.  Last Reviewed:   04/16/2015  Goals Addressed Today:    Target goals addressed today have to do with issues of depression in particular feelings of helplessness and hopelessness.   Impression/Diagnosis:   The patient has a long history of depressive disorder and underlying anxiety disorder. She has had numerous medical issues including severe fibromyalgia and severe arthritis. She has been diagnosed with significant arthritis throughout her body and in particular her hands and her feet. Numerous events and left are overwhelmed and unable to work. The patient has had periodic times of improvement for primary symptoms but there are other times where the symptoms are so problematic that she has not been able to maintain gainful employment.  Diagnosis:    Axis I:  Major depressive disorder, recurrent, severe without psychotic features  Anxiety    RODENBOUGH,JOHN R, PsyD 06/02/2015

## 2015-06-02 NOTE — Progress Notes (Signed)
      PROGRESS NOTE   Patient:  Lauren [name]   DOB: 1951-07-10  MR Number: [id number]  Location: [facility] [address] [place] [address] Dept: [phone]  Start: 4 PM End: 5 PM  Provider/Observer:     Edgardo Roys PSYD  Chief Complaint:      Chief Complaint  Patient presents with  . Depression  . Anxiety    Reason For Service:     The patient has a long history of major depressive condition and ongoing underlying depressive disorder. She is a recovering alcoholic but has many many years this Friday. Significant symptoms of anxiety have played a major role in substance abuse in the past. Significant medical problems and her inability to work have complicated her situation. When all these difficulties are put in the context and relationship to potential workability it is clear that she is disabled not do not expect her to return to gainful employment.  Interventions Strategy:  Cognitive/behavioral psychotherapy  Participation Level:   Active  Participation Quality:  Appropriate      Behavioral Observation:  Well Groomed, Alert, and Appropriate.   Current Psychosocial Factors: The patient reports that she has continued to have a lot of stress particularly with psychosocial issues that she becomes overwhelmed by. The patient reports that she knows that the actual events are not as severe as she reacts to that she continues to react greatly to issues with her husband, extended family member and her inability to work.    Content of Session:   Review current symptoms and continue to work on therapeutic interventions for improved coping skills and strategies.  Current Status:   The patient reports that she has had more anxiety and for a sustained period of time lately. The patient reports that she is not sure what was told that she continues to not do very well at all. We continue  to work on coping skills and strategies and she is trying her therapeutic interventions but reports that she continues to be quite agitated and overwhelmed with anxiety   Patient Progress:   Stable and improving  Target Goals:   Reduce the frequency of depressive events including feelings of hopelessness and helplessness, anhedonia, social withdrawal, as well as significant anxiety symptoms related to social avoidance, fear of abandonment and fear of inability to manage and cope with her world. We will also look at continuing complete sobriety.  Last Reviewed:   02/13/2015  Goals Addressed Today:    Target goals addressed today have to do with issues of depression in particular feelings of helplessness and hopelessness.   Impression/Diagnosis:   The patient has a long history of depressive disorder and underlying anxiety disorder. She has had numerous medical issues including severe fibromyalgia and severe arthritis. She has been diagnosed with significant arthritis throughout her body and in particular her hands and her feet. Numerous events and left are overwhelmed and unable to work. The patient has had periodic times of improvement for primary symptoms but there are other times where the symptoms are so problematic that she has not been able to maintain gainful employment.  Diagnosis:    Axis I:  Major depressive disorder, recurrent, severe without psychotic features  Anxiety    RODENBOUGH,JOHN R, PsyD 06/02/2015

## 2015-06-12 ENCOUNTER — Other Ambulatory Visit (INDEPENDENT_AMBULATORY_CARE_PROVIDER_SITE_OTHER): Payer: Self-pay | Admitting: Internal Medicine

## 2015-06-17 ENCOUNTER — Ambulatory Visit (INDEPENDENT_AMBULATORY_CARE_PROVIDER_SITE_OTHER): Payer: 59 | Admitting: Psychology

## 2015-06-17 DIAGNOSIS — F39 Unspecified mood [affective] disorder: Secondary | ICD-10-CM

## 2015-06-17 DIAGNOSIS — F332 Major depressive disorder, recurrent severe without psychotic features: Secondary | ICD-10-CM

## 2015-06-17 DIAGNOSIS — F419 Anxiety disorder, unspecified: Secondary | ICD-10-CM | POA: Diagnosis not present

## 2015-06-18 ENCOUNTER — Encounter (HOSPITAL_COMMUNITY): Payer: Self-pay | Admitting: Psychology

## 2015-06-18 NOTE — Progress Notes (Signed)
      PROGRESS NOTE   Patient:  Lauren [name]   DOB: [birth year]  MR Number: [id number]  Location: [facility] [address] [place] [address] Dept: [phone]  Start: 3 PM End: 4 PM  Provider/Observer:     Edgardo Roys PSYD  Chief Complaint:      Chief Complaint  Patient presents with  . Depression  . Anxiety    Reason For Service:     The patient has a long history of major depressive condition and ongoing underlying depressive disorder. She is a recovering alcoholic but has many many years this Friday. Significant symptoms of anxiety have played a major role in substance abuse in the past. Significant medical problems and her inability to work have complicated her situation. When all these difficulties are put in the context and relationship to potential workability it is clear that she is disabled not do not expect her to return to gainful employment.  Interventions Strategy:  Cognitive/behavioral psychotherapy  Participation Level:   Active  Participation Quality:  Appropriate      Behavioral Observation:  Well Groomed, Alert, and Appropriate.   Current Psychosocial Factors: The patient reports that she has continued to do very poorly with regard to her anxiety and coping skills. The patient reports that she has been actively working on coping interventions and while there has been a slight improvement she continues to do very poorly with regard to her anxiety..    Content of Session:   Review current symptoms and continue to work on therapeutic interventions for improved coping skills and strategies.  Current Status:   The patient reports that she has she has continued to be an episode of height anxiety and distress.  Patient Progress:   Stable and improving  Target Goals:   Reduce the frequency of depressive events including feelings of hopelessness and helplessness,  anhedonia, social withdrawal, as well as significant anxiety symptoms related to social avoidance, fear of abandonment and fear of inability to manage and cope with her world. We will also look at continuing complete sobriety.  Last Reviewed:   05/15/2015  Goals Addressed Today:    Target goals addressed today have to do with issues of depression in particular feelings of helplessness and hopelessness.   Impression/Diagnosis:   The patient has a long history of depressive disorder and underlying anxiety disorder. She has had numerous medical issues including severe fibromyalgia and severe arthritis. She has been diagnosed with significant arthritis throughout her body and in particular her hands and her feet. Numerous events and left are overwhelmed and unable to work. The patient has had periodic times of improvement for primary symptoms but there are other times where the symptoms are so problematic that she has not been able to maintain gainful employment.  Diagnosis:    Axis I:  Major depressive disorder, recurrent, severe without psychotic features ([place])  Anxiety    Madigan Rosensteel R, PsyD 06/18/2015

## 2015-06-18 NOTE — Progress Notes (Signed)
      PROGRESS NOTE   Patient:  Lauren [name]   DOB: 10/06/1950  MR Number: [id number]  Location: [facility] [address] [place] [address] Dept: [phone]  Start: 3 PM End: 4 PM  Provider/Observer:     Edgardo Roys PSYD  Chief Complaint:      Chief Complaint  Patient presents with  . Agitation  . Anxiety  . Depression    Reason For Service:     The patient has a long history of major depressive condition and ongoing underlying depressive disorder. She is a recovering alcoholic but has many many years this Friday. Significant symptoms of anxiety have played a major role in substance abuse in the past. Significant medical problems and her inability to work have complicated her situation. When all these difficulties are put in the context and relationship to potential workability it is clear that she is disabled not do not expect her to return to gainful employment.  Interventions Strategy:  Cognitive/behavioral psychotherapy  Participation Level:   Active  Participation Quality:  Appropriate      Behavioral Observation:  Well Groomed, Alert, and Appropriate.   Current Psychosocial Factors: The patient reports that she and her husband have agreed to take in a gentleman who has kindly to remain free from any type of opiate abuse. This is somewhat that they have known for some time and is always had a good job etc. While there is some apprehension about it the patient and her husband agreed to do this. The patient is worried that this will turn into another complicated situation for her husband to cope with.    Content of Session:   Review current symptoms and continue to work on therapeutic interventions for improved coping skills and strategies.  Current Status:   The patient reports that she has has had a slight improvement in her depression anxiety recently. She has been  actively working on therapeutic interventions we have been discussing.  Patient Progress:   Stable and improving  Target Goals:   Reduce the frequency of depressive events including feelings of hopelessness and helplessness, anhedonia, social withdrawal, as well as significant anxiety symptoms related to social avoidance, fear of abandonment and fear of inability to manage and cope with her world. We will also look at continuing complete sobriety.  Last Reviewed:   06/01/2015  Goals Addressed Today:    Target goals addressed today have to do with issues of depression in particular feelings of helplessness and hopelessness.   Impression/Diagnosis:   The patient has a long history of depressive disorder and underlying anxiety disorder. She has had numerous medical issues including severe fibromyalgia and severe arthritis. She has been diagnosed with significant arthritis throughout her body and in particular her hands and her feet. Numerous events and left are overwhelmed and unable to work. The patient has had periodic times of improvement for primary symptoms but there are other times where the symptoms are so problematic that she has not been able to maintain gainful employment.  Diagnosis:    Axis I:  Major depressive disorder, recurrent, severe without psychotic features ([place])  Anxiety    Zuzu Befort R, PsyD 06/18/2015

## 2015-06-18 NOTE — Progress Notes (Signed)
      PROGRESS NOTE   Patient:  Lauren [name]   DOB: May 20, 1951  MR Number: [id number]  Location: [facility] [address] [place] [address] Dept: [phone]  Start: 4 PM End: 5 PM  Provider/Observer:     Edgardo Roys PSYD  Chief Complaint:      Chief Complaint  Patient presents with  . Agitation  . Anxiety  . Depression  . Stress    Reason For Service:     The patient has a long history of major depressive condition and ongoing underlying depressive disorder. She is a recovering alcoholic but has many many years this Friday. Significant symptoms of anxiety have played a major role in substance abuse in the past. Significant medical problems and her inability to work have complicated her situation. When all these difficulties are put in the context and relationship to potential workability it is clear that she is disabled not do not expect her to return to gainful employment.  Interventions Strategy:  Cognitive/behavioral psychotherapy  Participation Level:   Active  Participation Quality:  Appropriate      Behavioral Observation:  Well Groomed, Alert, and Appropriate.   Current Psychosocial Factors: The patient reports that she has been doing better lately. The gentleman that has been staying at their house during his drug rehabilitation/[facility] efforts has been doing quite well. She reports that he has been helping out tremendously around the house and in the yard and is also been helping them inside the house. The patient reports that at this point he has not relapsed which was one of the primary fears.  Content of Session:   Review current symptoms and continue to work on therapeutic interventions for improved coping skills and strategies.  Current Status:   The patient reports that she has   Patient Progress:   Stable and improving  Target Goals:   Reduce the  frequency of depressive events including feelings of hopelessness and helplessness, anhedonia, social withdrawal, as well as significant anxiety symptoms related to social avoidance, fear of abandonment and fear of inability to manage and cope with her world. We will also look at continuing complete sobriety.  Last Reviewed:   06/17/2015  Goals Addressed Today:    Target goals addressed today have to do with issues of depression in particular feelings of helplessness and hopelessness.   Impression/Diagnosis:   The patient has a long history of depressive disorder and underlying anxiety disorder. She has had numerous medical issues including severe fibromyalgia and severe arthritis. She has been diagnosed with significant arthritis throughout her body and in particular her hands and her feet. Numerous events and left are overwhelmed and unable to work. The patient has had periodic times of improvement for primary symptoms but there are other times where the symptoms are so problematic that she has not been able to maintain gainful employment.  Diagnosis:    Axis I:  Major depressive disorder, recurrent, severe without psychotic features ([place])  Anxiety  Mood disorder ([place])    RODENBOUGH,JOHN R, PsyD 06/18/2015

## 2015-07-06 ENCOUNTER — Telehealth (HOSPITAL_COMMUNITY): Payer: Self-pay | Admitting: *Deleted

## 2015-07-10 ENCOUNTER — Ambulatory Visit (HOSPITAL_COMMUNITY): Payer: Self-pay | Admitting: Psychiatry

## 2015-07-12 IMAGING — US US ABDOMEN COMPLETE W/ ELASTOGRAPHY
1 series · 13 of 25 positions shown · non-contrast
Comparison: Abdomen ultrasound on 03/29/2013

CLINICAL DATA: Chronic hepatitis-C without hepatic coma. Prior
cholecystectomy. Nausea.



[Series 1: us abdomen complete w/ elastography · 0.20mm/px · 13 of 82 slices shown]
[im 1/82]
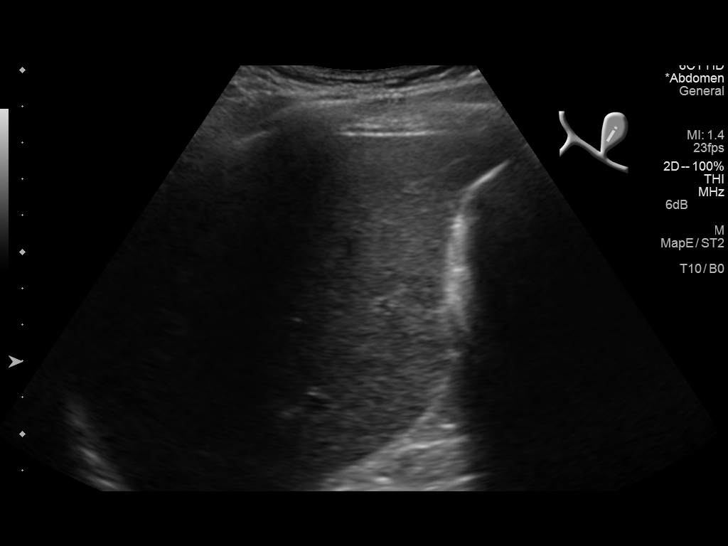
[im 7/82]
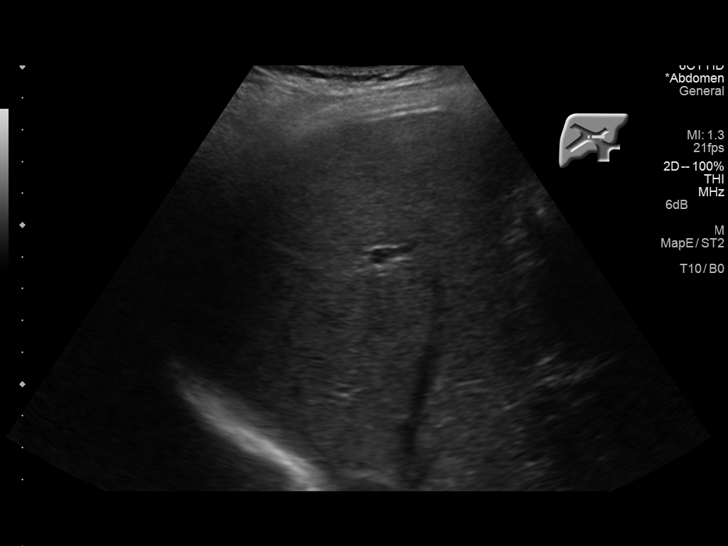
[im 14/82]
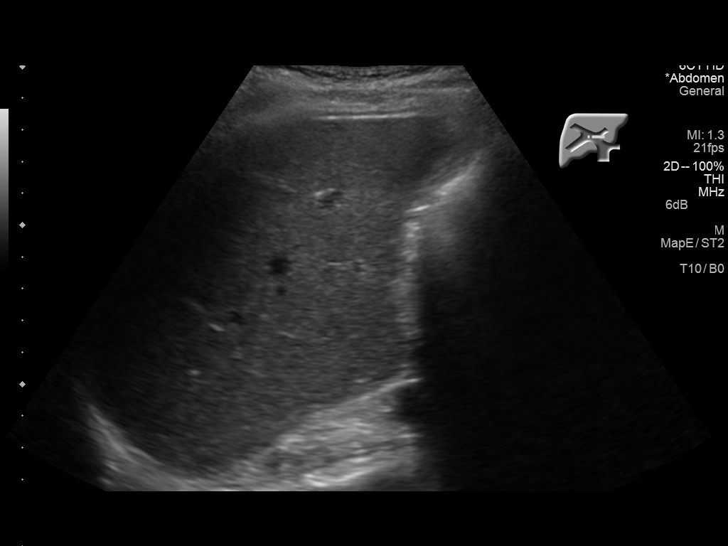
[im 21/82]
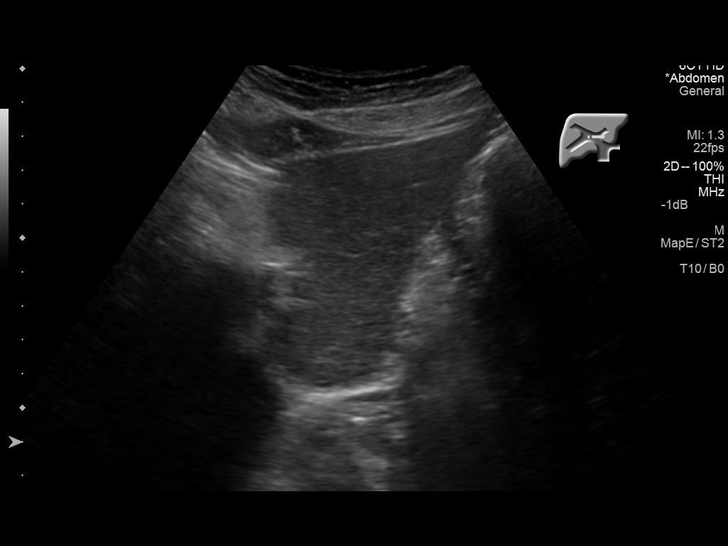
[im 28/82]
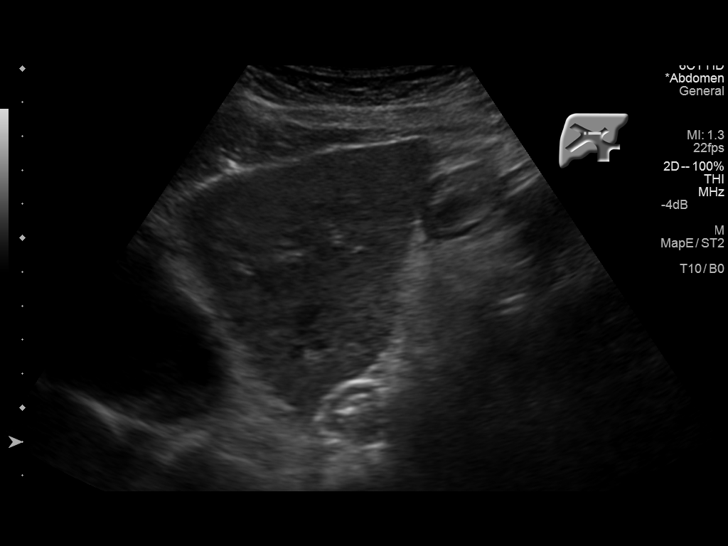
[im 34/82]
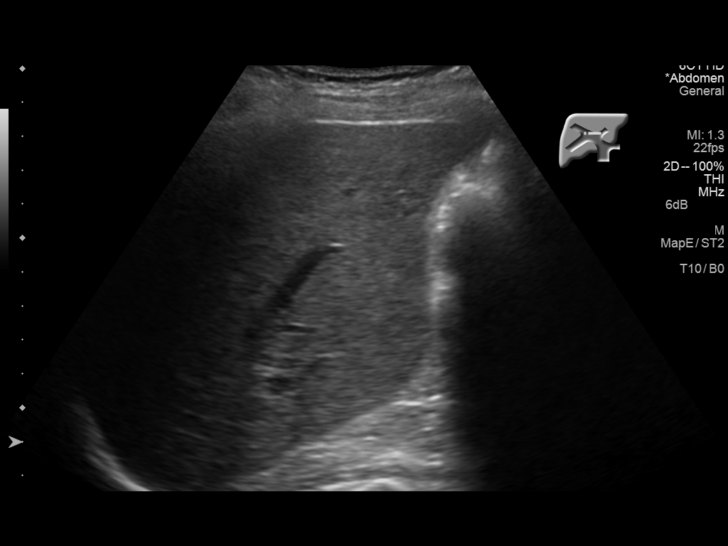
[im 41/82]
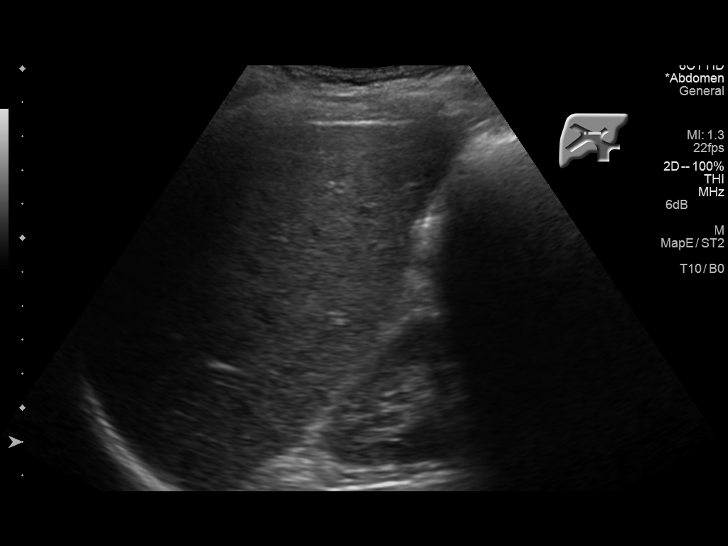
[im 48/82]
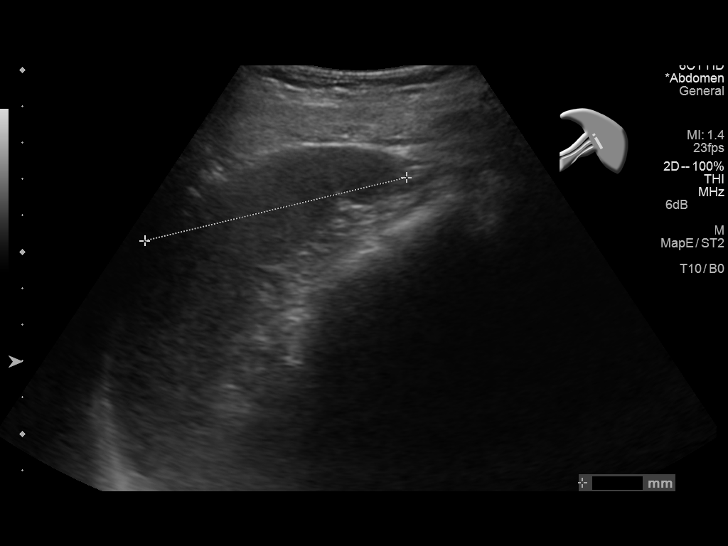
[im 55/82]
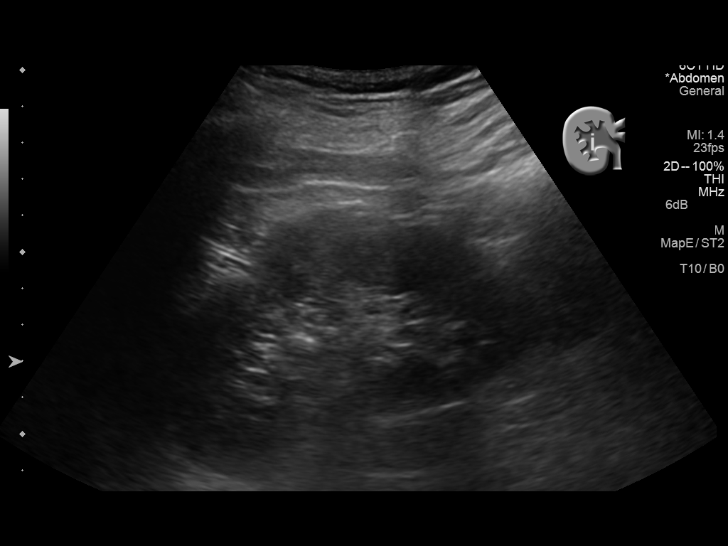
[im 61/82]
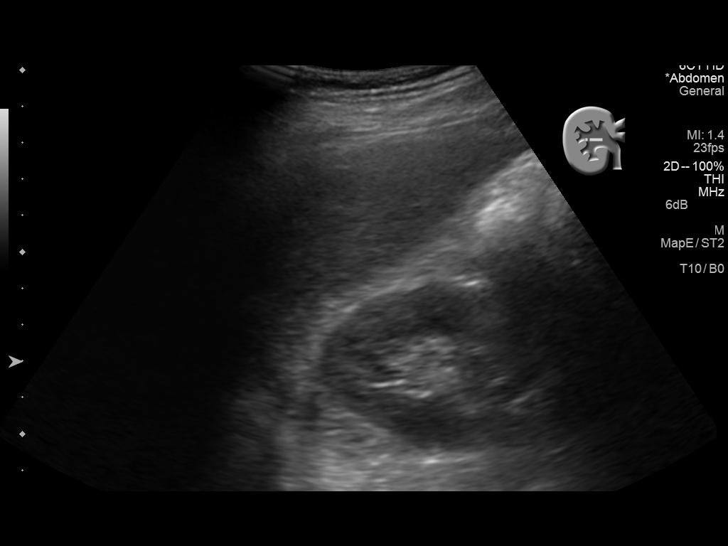
[im 68/82]
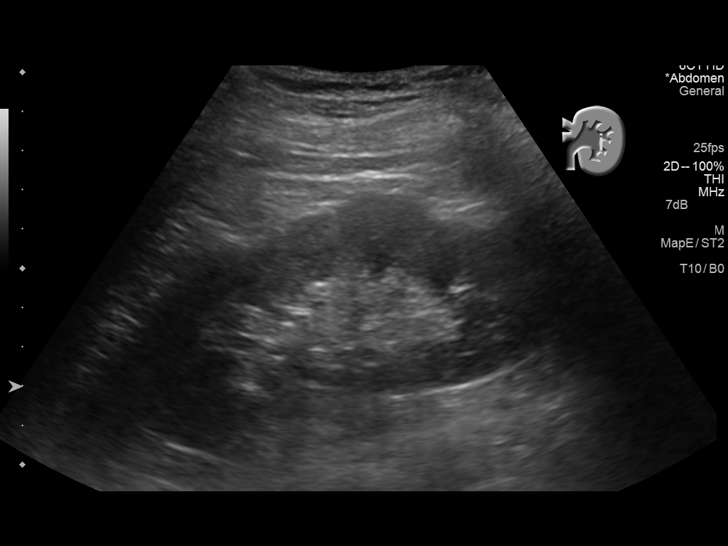
[im 75/82]
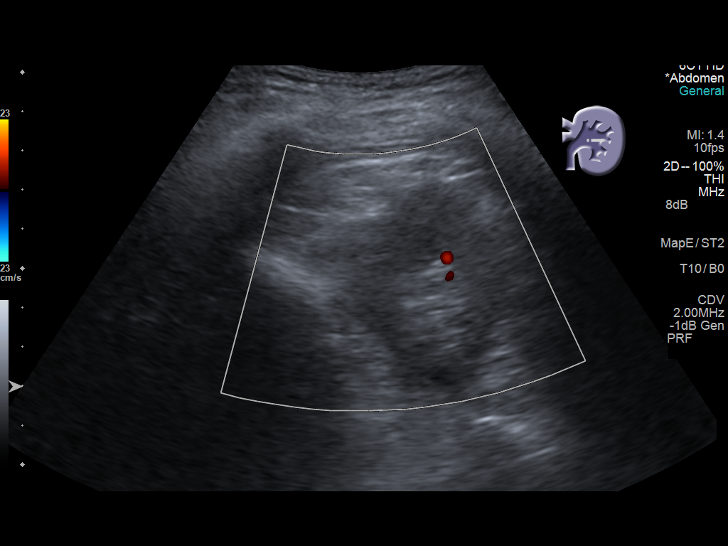
[im 82/82]
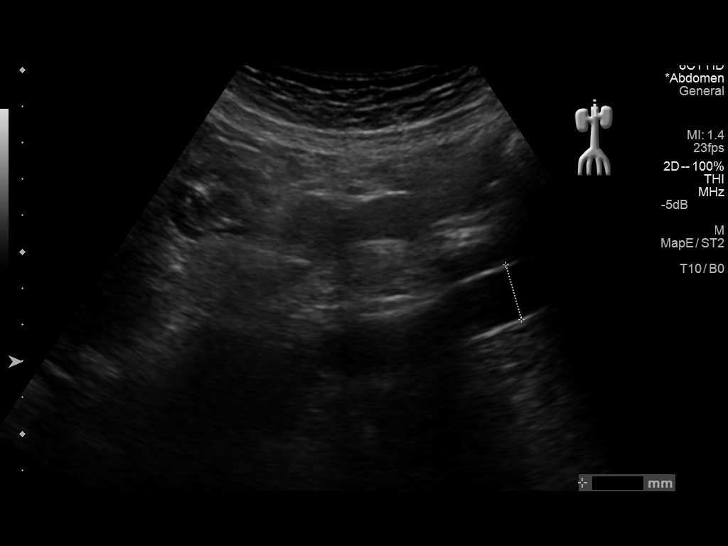

[13 of 25 positions shown; findings below may reference images not displayed]

FINDINGS: ULTRASOUND ABDOMEN

Gallbladder: Surgically absent.

Common bile duct: Diameter: 6 mm, within normal limits status post
cholecystectomy.

Liver: No focal lesion identified. Within normal limits in
parenchymal echogenicity.

IVC: No abnormality visualized.

Pancreas: Visualized portion unremarkable.

Spleen: Size and appearance within normal limits.

Right Kidney: Length: 10.8 cm. Echogenicity within normal limits. No
mass or hydronephrosis visualized.

Left Kidney: Length: 11.5 cm. Echogenicity within normal limits. No
mass or hydronephrosis visualized.

Abdominal aorta: No aneurysm visualized.

Other findings: None.

ULTRASOUND HEPATIC ELASTOGRAPHY

Device: Siemens Helix VTQ

Transducer 6C 1

Patient position: Left lateral decubitus

Number of measurements:  8

Hepatic Segment:  8

Median velocity:   1.59  m/sec

IQR:

IQR/Median velocity ratio

Corresponding Metavir fibrosis score:  F2+ some F3

Risk of fibrosis: Moderate

Limitations of exam: Some liver motion during evaluation

Pertinent findings noted on other imaging exams:  None

Please note that abnormal shear wave velocities may also be
identified in clinical settings other than with hepatic fibrosis,
such as: acute hepatitis, elevated right heart and central venous
pressures including use of beta blockers, Aaron disease
([place]), infiltrative processes such as
mastocytosis/amyloidosis/infiltrative tumor, extrahepatic
cholestasis, in the post-prandial state, and liver transplantation.
Correlation with patient history, laboratory data, and clinical
condition recommended.
IMPRESSION: Prior cholecystectomy.  No evidence of biliary ductal dilatation.

Unremarkable sonographic appearance of hepatic parenchyma. No other
significant abnormality identified.

Median hepatic shear wave velocity is calculated at 1.59 m/sec.

Corresponding Metavir fibrosis score is F2 +some F3.

Risk of fibrosis is moderate.

Follow-up:  Additional testing appropriate

## 2015-07-15 ENCOUNTER — Encounter (HOSPITAL_COMMUNITY): Payer: Self-pay | Admitting: Psychology

## 2015-07-15 ENCOUNTER — Ambulatory Visit (INDEPENDENT_AMBULATORY_CARE_PROVIDER_SITE_OTHER): Payer: 59 | Admitting: Psychology

## 2015-07-15 DIAGNOSIS — F419 Anxiety disorder, unspecified: Secondary | ICD-10-CM | POA: Diagnosis not present

## 2015-07-15 DIAGNOSIS — F332 Major depressive disorder, recurrent severe without psychotic features: Secondary | ICD-10-CM

## 2015-07-15 NOTE — Progress Notes (Signed)
      PROGRESS NOTE   Patient:  Lauren [name]   DOB: Jan 17, 1951  MR Number: [id number]  Location: [facility] [address] [place] [address] Dept: [phone]  Start: 11 AM End: 12 PM  Provider/Observer:     Edgardo Roys PSYD  Chief Complaint:      Chief Complaint  Patient presents with  . Agitation  . Anxiety  . Depression  . Stress    Reason For Service:     The patient has a long history of major depressive condition and ongoing underlying depressive disorder. She is a recovering alcoholic but has many many years this Friday. Significant symptoms of anxiety have played a major role in substance abuse in the past. Significant medical problems and her inability to work have complicated her situation. When all these difficulties are put in the context and relationship to potential workability it is clear that she is disabled not do not expect her to return to gainful employment.  Interventions Strategy:  Cognitive/behavioral psychotherapy  Participation Level:   Active  Participation Quality:  Appropriate      Behavioral Observation:  Well Groomed, Alert, and Appropriate.   Current Psychosocial Factors: The patient reports that she has done better and the man that is living at their house is doing well.  She reports that she is freaking out about Trump and this has caused her a lot of stress today.  She reports that she could not sleep last night do to fears about what might happen.  Content of Session:   Review current symptoms and continue to work on therapeutic interventions for improved coping skills and strategies.  Current Status:   The patient reports that she has been doing better is some ways until the past 24 hours.  We worked on Therapist, occupational and using them to cope right now.  Patient Progress:   Stable and improving  Target Goals:   Reduce the frequency  of depressive events including feelings of hopelessness and helplessness, anhedonia, social withdrawal, as well as significant anxiety symptoms related to social avoidance, fear of abandonment and fear of inability to manage and cope with her world. We will also look at continuing complete sobriety.  Last Reviewed:   07/15/2015  Goals Addressed Today:    Target goals addressed today have to do with issues of depression in particular feelings of helplessness and hopelessness.   Impression/Diagnosis:   The patient has a long history of depressive disorder and underlying anxiety disorder. She has had numerous medical issues including severe fibromyalgia and severe arthritis. She has been diagnosed with significant arthritis throughout her body and in particular her hands and her feet. Numerous events and left are overwhelmed and unable to work. The patient has had periodic times of improvement for primary symptoms but there are other times where the symptoms are so problematic that she has not been able to maintain gainful employment.  Diagnosis:    Axis I:  Major depressive disorder, recurrent, severe without psychotic features ([place])  Anxiety    Laurie Penado R, PsyD 07/15/2015

## 2015-07-23 ENCOUNTER — Encounter (HOSPITAL_COMMUNITY): Payer: Self-pay | Admitting: Psychiatry

## 2015-07-23 ENCOUNTER — Ambulatory Visit (INDEPENDENT_AMBULATORY_CARE_PROVIDER_SITE_OTHER): Payer: 59 | Admitting: Psychiatry

## 2015-07-23 VITALS — BP 142/85 | HR 92 | Ht 67.0 in | Wt 142.0 lb

## 2015-07-23 DIAGNOSIS — F332 Major depressive disorder, recurrent severe without psychotic features: Secondary | ICD-10-CM

## 2015-07-23 MED ORDER — VENLAFAXINE HCL ER 75 MG PO CP24: 75.0000 mg | ORAL_CAPSULE | Freq: Every day | ORAL | Status: DC

## 2015-07-23 MED ORDER — LAMOTRIGINE 25 MG PO TABS: 50.0000 mg | ORAL_TABLET | Freq: Two times a day (BID) | ORAL | Status: DC

## 2015-07-23 MED ORDER — LORAZEPAM 1 MG PO TABS: 1.0000 mg | ORAL_TABLET | Freq: Three times a day (TID) | ORAL | Status: DC

## 2015-07-23 NOTE — Progress Notes (Signed)
Patient ID: Lauren [name], female   DOB: 07-24-1951, 64 y.o.   MRN: [id number] Patient ID: Lauren [name], female   DOB: [birth year], 64 y.o.   MRN: [id number] Patient ID: Lauren [name], female   DOB: 01-01-1951, 64 y.o.   MRN: [id number] Patient ID: Lauren [name], female   DOB: [birth year], 64 y.o.   MRN: [id number] Patient ID: Lauren [name], female   DOB: December 27, 1950, 64 y.o.   MRN: [id number] Patient ID: Lauren [name], female   DOB: 1951/04/27, 64 y.o.   MRN: [id number] Patient ID: Lauren [name], female   DOB: September 20, 1950, 64 y.o.   MRN: [id number] Patient ID: Lauren [name], female   DOB: [birth year], 64 y.o.   MRN: [id number] Patient ID: Lauren [name], female   DOB: 11/17/1950, 64 y.o.   MRN: [id number] Patient ID: Lauren [name], female   DOB: [birth year], 64 y.o.   MRN: [id number] Patient ID: Lauren [name], female   DOB: 08/09/1951, 64 y.o.   MRN: [id number] Patient ID: Lauren [name], female   DOB: [birth year], 64 y.o.   MRN: [id number] Patient ID: Lauren [name], female   DOB: Oct 25, 1950, 64 y.o.   MRN: [id number] Patient ID: Lauren [name], female   DOB: 1951/03/20, 64 y.o.   MRN: [id number] Patient ID: Lauren [name], female   DOB: 24-Mar-1951, 64 y.o.   MRN: [id number] Patient ID: Lauren [name], female   DOB: [birth year], 64 y.o.   MRN: [id number] [place] [id number] Progress Note Lauren [name] MRN: [id number] DOB: Jan 14, 1951 Age: 64 y.o.  Date: 07/23/2015  Chief Complaint: Chief Complaint  Patient presents with  . Depression  . Manic Behavior  . Anxiety  . Follow-up   Subjective: I'm doing well  History of presenting illness This patient is a 64 year old married white female who lives with her husband. She has one stepchild. Her step son killed himself 7 years ago at age 52. He was an alcoholic. The patient is on disability for fibromyalgia  The patient has a long history of depression. She was last hospitalized in 2010 after she took too many pain pills and got confused.  She use to drink heavily but has been sober 23 years and is very active in [employer] and church. For the most part she feels her mood is stable. She sleeping pretty well. Her energy is good. She has a hard time functioning without Ativan but she only usually takes one pill a day side think this is reasonable. She tried a higher dose of Cymbalta but it made her manic and revved up  The patient returns after 3 months. She's been doing well. Her husband's 75 year old nephew moved in after attending rehabilitation. He's been a lot of help and she is focused on helping him as well. Her mood is been stable and she's not had any symptoms of depression mania or anxiety and she is sleeping well. Allergies: Allergies  Allergen Reactions  . Elavil [Amitriptyline] Other (See Comments)    Vivid dreams and almost suicidal  . Flagyl [Metronidazole] Itching  . Vistaril [Hydroxyzine Hcl] Other (See Comments)    Got higher than a kite on too much of this.  Lindajo Royal [Ziprasidone Hydrochloride]   . Lactose Intolerance (Gi)   Medical History: Past Medical History  Diagnosis Date  . Depression   . Osteoarthritis   . Fibromyalgia   . IBS (irritable bowel syndrome)   . GERD (gastroesophageal reflux disease)   . Prediabetes   .  Chronic pain   . Fibromyalgia   . Hepatitis C   Surgical History: Past Surgical History  Procedure Laterality Date  . Tonsillectomy    . Cholecystectomy    . Colonoscopy    . Upper gastrointestinal endoscopy    . Mandible surgery  09/06/1979  . Orif wrist fracture Right 07/23/2013    Procedure: OPEN REDUCTION INTERNAL FIXATION (ORIF) WRIST FRACTURE;  Surgeon: Roseanne Kaufman, MD;  Location: [facility];  Service: Orthopedics;  Laterality: Right;  . Colonoscopy N/A 10/23/2014    Procedure: COLONOSCOPY;  Surgeon: Rogene Houston, MD;  [facility];  Service: Endoscopy;  Laterality: N/A;  1030  . Esophagogastroduodenoscopy N/A 10/23/2014    Procedure: ESOPHAGOGASTRODUODENOSCOPY (EGD);   Surgeon: Rogene Houston, MD;  [facility];  Service: Endoscopy;  Laterality: N/A;  . Total knee arthroplasty      2011 rt knee  Family History family history includes Alcohol abuse in her cousin, father, maternal grandfather, maternal grandmother, paternal grandfather, paternal grandmother, and paternal uncle; Anxiety disorder in her maternal uncle and paternal uncle; Depression in her father; Diabetes in her brother, brother, and father; Healthy in her brother; Heart disease in her brother; Irritable bowel syndrome in her mother; OCD in her brother and brother; Sexual abuse in her mother; Stroke in her father and mother. There is no history of ADD / ADHD, Bipolar disorder, Dementia, Drug abuse, Paranoid behavior, Schizophrenia, Seizures, or Physical abuse. Reviewed al this again to day in the appointment and nothing has changed  Mental status examination Patient is neatly dressed and groomed.  She she states her mood is good and her affect is bright She denies paranoia and she denies any hallucination. She denies suicidal and homicidal ideation her speech is clear and understandable, she no longer has sped up speech or agitation She denies auditory and visual hallucinations or paranoia Her attention and concentration is fair. Her thought process is normal  She's alert and oriented x3 and her insight judgment and impulse control are good  Lab Results:  Results for orders placed or performed in visit on 04/08/15 (from the past 8736 hour(s))  CBC with Differential/Platelet   Collection Time: 04/08/15  8:40 AM  Result Value Ref Range   WBC 4.8 4.0 - 10.5 K/uL   RBC 4.46 3.87 - 5.11 MIL/uL   Hemoglobin 12.5 12.0 - 15.0 g/dL   HCT 37.6 36.0 - 46.0 %   MCV 84.3 78.0 - 100.0 fL   MCH 28.0 26.0 - 34.0 pg   MCHC 33.2 30.0 - 36.0 g/dL   RDW 13.6 11.5 - 15.5 %   Platelets 220 150 - 400 K/uL   MPV 9.8 8.6 - 12.4 fL   Neutrophils Relative % 46 43 - 77 %   Neutro Abs 2.2 1.7 - 7.7 K/uL    Lymphocytes Relative 41 12 - 46 %   Lymphs Abs 2.0 0.7 - 4.0 K/uL   Monocytes Relative 9 3 - 12 %   Monocytes Absolute 0.4 0.1 - 1.0 K/uL   Eosinophils Relative 3 0 - 5 %   Eosinophils Absolute 0.1 0.0 - 0.7 K/uL   Basophils Relative 1 0 - 1 %   Basophils Absolute 0.0 0.0 - 0.1 K/uL   Smear Review Criteria for review not met   Hepatic function panel   Collection Time: 04/08/15  8:40 AM  Result Value Ref Range   Total Bilirubin 0.3 0.2 - 1.2 mg/dL   Bilirubin, Direct 0.1 <=0.2 mg/dL  Indirect Bilirubin 0.2 0.2 - 1.2 mg/dL   Alkaline Phosphatase 48 33 - 130 U/L   AST 19 10 - 35 U/L   ALT 13 6 - 29 U/L   Total Protein 6.8 6.1 - 8.1 g/dL   Albumin 4.0 3.6 - 5.1 g/dL  Hepatitis C RNA quantitative   Collection Time: 04/08/15  8:40 AM  Result Value Ref Range   HCV Quantitative Not Detected <15 IU/mL   HCV Quantitative Log NOT CALC <1.18 log 10  AFP tumor marker   Collection Time: 04/08/15  9:22 AM  Result Value Ref Range   AFP-Tumor Marker 3.4 <6.1 ng/mL  Results for orders placed or performed in visit on 02/19/15 (from the past 8736 hour(s))  Hepatic function panel   Collection Time: 02/19/15 12:57 PM  Result Value Ref Range   Total Bilirubin 0.5 0.2 - 1.2 mg/dL   Bilirubin, Direct 0.1 0.0 - 0.3 mg/dL   Indirect Bilirubin 0.4 0.2 - 1.2 mg/dL   Alkaline Phosphatase 60 39 - 117 U/L   AST 21 0 - 37 U/L   ALT 14 0 - 35 U/L   Total Protein 7.5 6.0 - 8.3 g/dL   Albumin 4.2 3.5 - 5.2 g/dL  Results for orders placed or performed in visit on 01/28/15 (from the past 8736 hour(s))  CBC with Differential/Platelet   Collection Time: 02/19/15 12:55 PM  Result Value Ref Range   WBC 6.5 4.0 - 10.5 K/uL   RBC 4.94 3.87 - 5.11 MIL/uL   Hemoglobin 14.0 12.0 - 15.0 g/dL   HCT 42.1 36.0 - 46.0 %   MCV 85.2 78.0 - 100.0 fL   MCH 28.3 26.0 - 34.0 pg   MCHC 33.3 30.0 - 36.0 g/dL   RDW 13.6 11.5 - 15.5 %   Platelets 241 150 - 400 K/uL   MPV 10.3 8.6 - 12.4 fL   Neutrophils Relative % 54 43 -  77 %   Neutro Abs 3.5 1.7 - 7.7 K/uL   Lymphocytes Relative 34 12 - 46 %   Lymphs Abs 2.2 0.7 - 4.0 K/uL   Monocytes Relative 7 3 - 12 %   Monocytes Absolute 0.5 0.1 - 1.0 K/uL   Eosinophils Relative 4 0 - 5 %   Eosinophils Absolute 0.3 0.0 - 0.7 K/uL   Basophils Relative 1 0 - 1 %   Basophils Absolute 0.1 0.0 - 0.1 K/uL   Smear Review Criteria for review not met   Hepatitis C RNA quantitative   Collection Time: 02/19/15 12:59 PM  Result Value Ref Range   HCV Quantitative Not Detected <15 IU/mL   HCV Quantitative Log NOT CALC <1.18 log 10  Results for orders placed or performed in visit on 01/21/15 (from the past 8736 hour(s))  CBC with Differential/Platelet   Collection Time: 01/21/15  3:06 PM  Result Value Ref Range   WBC 5.9 4.0 - 10.5 K/uL   RBC 4.73 3.87 - 5.11 MIL/uL   Hemoglobin 13.2 12.0 - 15.0 g/dL   HCT 39.6 36.0 - 46.0 %   MCV 83.7 78.0 - 100.0 fL   MCH 27.9 26.0 - 34.0 pg   MCHC 33.3 30.0 - 36.0 g/dL   RDW 12.9 11.5 - 15.5 %   Platelets 241 150 - 400 K/uL   MPV 10.7 8.6 - 12.4 fL   Neutrophils Relative % 54 43 - 77 %   Neutro Abs 3.2 1.7 - 7.7 K/uL   Lymphocytes Relative 32 12 - 46 %  Lymphs Abs 1.9 0.7 - 4.0 K/uL   Monocytes Relative 9 3 - 12 %   Monocytes Absolute 0.5 0.1 - 1.0 K/uL   Eosinophils Relative 4 0 - 5 %   Eosinophils Absolute 0.2 0.0 - 0.7 K/uL   Basophils Relative 1 0 - 1 %   Basophils Absolute 0.1 0.0 - 0.1 K/uL   Smear Review Criteria for review not met   Hepatic function panel   Collection Time: 01/21/15  3:06 PM  Result Value Ref Range   Total Bilirubin 0.2 0.2 - 1.2 mg/dL   Bilirubin, Direct 0.1 0.0 - 0.3 mg/dL   Indirect Bilirubin 0.1 (L) 0.2 - 1.2 mg/dL   Alkaline Phosphatase 56 39 - 117 U/L   AST 16 0 - 37 U/L   ALT 17 0 - 35 U/L   Total Protein 7.1 6.0 - 8.3 g/dL   Albumin 4.0 3.5 - 5.2 g/dL  Hepatitis C RNA quantitative   Collection Time: 01/21/15  3:06 PM  Result Value Ref Range   HCV Quantitative Not Detected <15 IU/mL    HCV Quantitative Log NOT CALC <1.18 log 10  Results for orders placed or performed in visit on 11/03/14 (from the past 8736 hour(s))  Hepatic function panel   Collection Time: 11/07/14  4:03 PM  Result Value Ref Range   Total Bilirubin 0.4 0.2 - 1.2 mg/dL   Bilirubin, Direct 0.1 0.0 - 0.3 mg/dL   Indirect Bilirubin 0.3 0.2 - 1.2 mg/dL   Alkaline Phosphatase 58 39 - 117 U/L   AST 31 0 - 37 U/L   ALT 34 0 - 35 U/L   Total Protein 7.2 6.0 - 8.3 g/dL   Albumin 4.3 3.5 - 5.2 g/dL  Hepatitis C RNA quantitative   Collection Time: 11/07/14  4:03 PM  Result Value Ref Range   HCV Quantitative 9532023 (H) <15 IU/mL   HCV Quantitative Log 6.76 (H) <1.18 log 10  Hepatitis C genotype   Collection Time: 11/07/14  4:03 PM  Result Value Ref Range   HCV Genotype 1a   Results for orders placed or performed in visit on 09/11/14 (from the past 8736 hour(s))  H. pylori antibody, IgG   Collection Time: 09/11/14  4:16 PM  Result Value Ref Range   H Pylori IgG 0.52 ISR  Family doctor is following her labs  Assessment Axis I Maj. depressive disorder, insomnia, pain issues, HIstory of alcoholism. Axis II deferred Axis III see medical history Axis IV mild to moderate  Plan: She will continue Lamictal 50 mg twice a day for mood stabilization. She'll continue Effexor XR to 75 mg daily for depression. She will also continue Ativan1 mg 3 times a day for anxiety. She is no longer using trazodone She'll return in 3 months  MEDICATIONS this encounter: Meds ordered this encounter  Medications  . dicyclomine (BENTYL) 20 MG tablet    Sig: Take 20 mg by mouth every 6 (six) hours.  Marland Kitchen venlafaxine XR (EFFEXOR XR) 75 MG 24 hr capsule    Sig: Take 1 capsule (75 mg total) by mouth daily.    Dispense:  30 capsule    Refill:  2  . lamoTRIgine (LAMICTAL) 25 MG tablet    Sig: Take 2 tablets (50 mg total) by mouth 2 (two) times daily.    Dispense:  120 tablet    Refill:  2  . LORazepam (ATIVAN) 1 MG tablet    Sig:  Take 1 tablet (1 mg total) by mouth  3 (three) times daily.    Dispense:  90 tablet    Refill:  2    Medical Decision Making Problem Points:  Established problem, worsening (2), Review of last therapy session (1) and Review of psycho-social stressors (1) Data Points:  Review or order clinical lab tests (1) Review of medication regiment & side effects (2) Review of new medications or change in dosage (2)  I certify that outpatient services furnished can reasonably be expected to improve the patient's condition.   Levonne Spiller, MD

## 2015-07-27 ENCOUNTER — Other Ambulatory Visit (HOSPITAL_COMMUNITY): Payer: Self-pay | Admitting: Rheumatology

## 2015-07-28 ENCOUNTER — Other Ambulatory Visit (HOSPITAL_COMMUNITY): Payer: Self-pay | Admitting: Rheumatology

## 2015-07-28 DIAGNOSIS — M25562 Pain in left knee: Secondary | ICD-10-CM

## 2015-08-06 ENCOUNTER — Ambulatory Visit (HOSPITAL_COMMUNITY): Admission: RE | Admit: 2015-08-06 | Payer: Medicare Other | Source: Ambulatory Visit

## 2015-08-07 ENCOUNTER — Ambulatory Visit (HOSPITAL_COMMUNITY): Admission: RE | Admit: 2015-08-07 | Payer: Medicare Other | Source: Ambulatory Visit

## 2015-08-10 ENCOUNTER — Ambulatory Visit (INDEPENDENT_AMBULATORY_CARE_PROVIDER_SITE_OTHER): Payer: 59 | Admitting: Psychology

## 2015-08-10 DIAGNOSIS — F332 Major depressive disorder, recurrent severe without psychotic features: Secondary | ICD-10-CM | POA: Diagnosis not present

## 2015-08-10 DIAGNOSIS — F419 Anxiety disorder, unspecified: Secondary | ICD-10-CM | POA: Diagnosis not present

## 2015-08-14 IMAGING — CR DG ANKLE COMPLETE 3+V*R*
3 series · 3 of 3 positions shown · non-contrast
Comparison: None.

CLINICAL DATA: Twisted ankle.  Pain.  Swelling.

EXAM:
RIGHT ANKLE - COMPLETE 3+ VIEW

[view not recorded (1 of 3)]
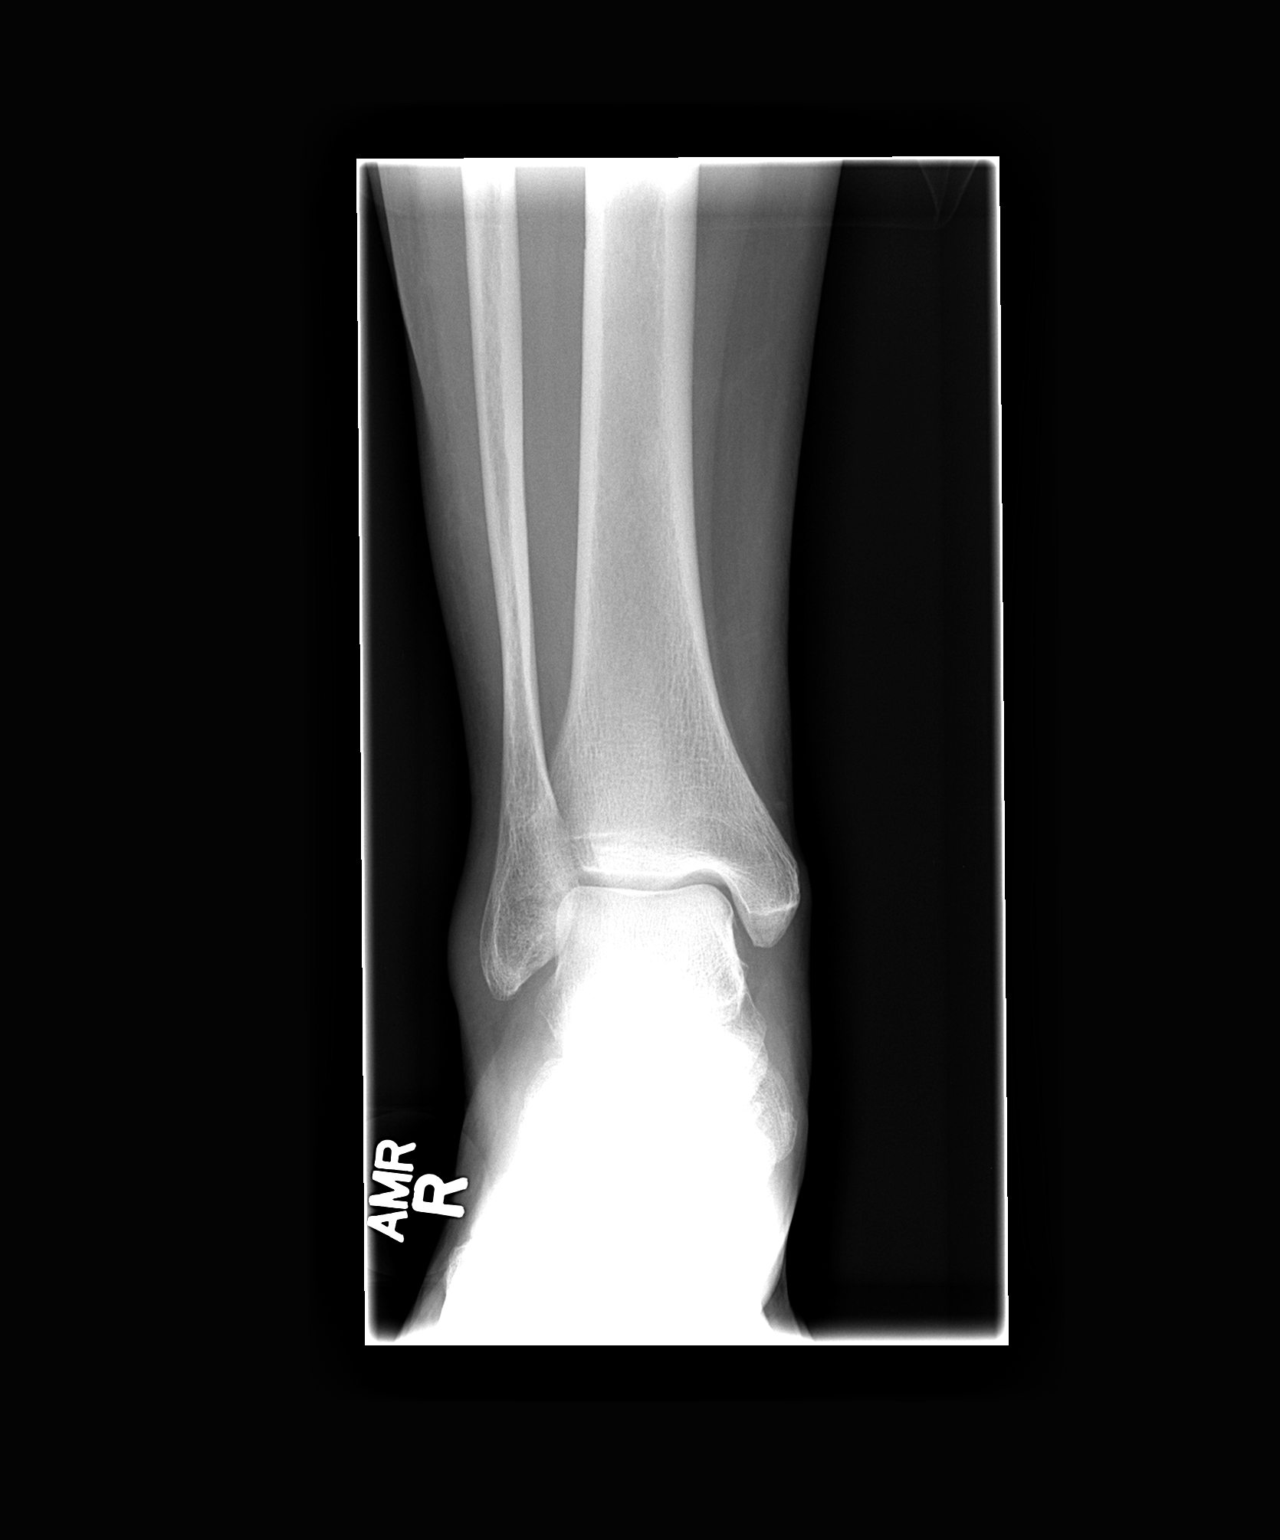

[view not recorded (2 of 3)]
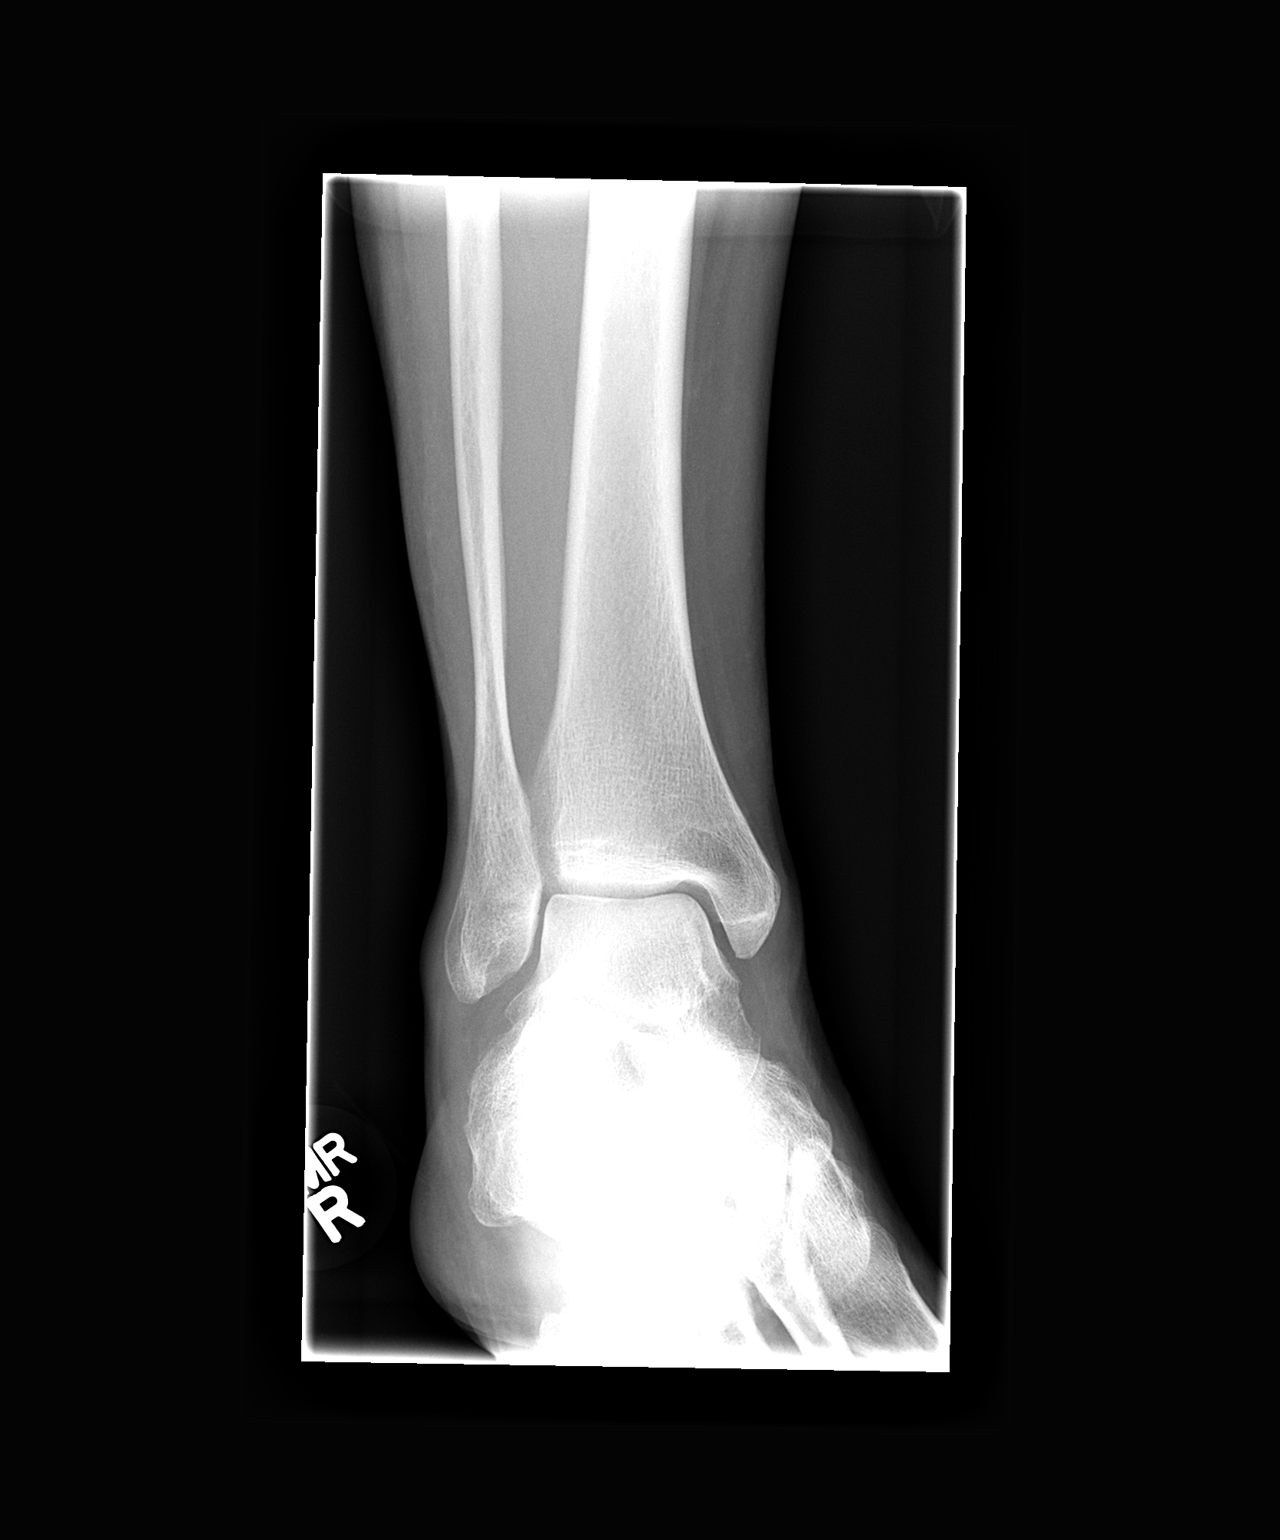

[view not recorded (3 of 3)]
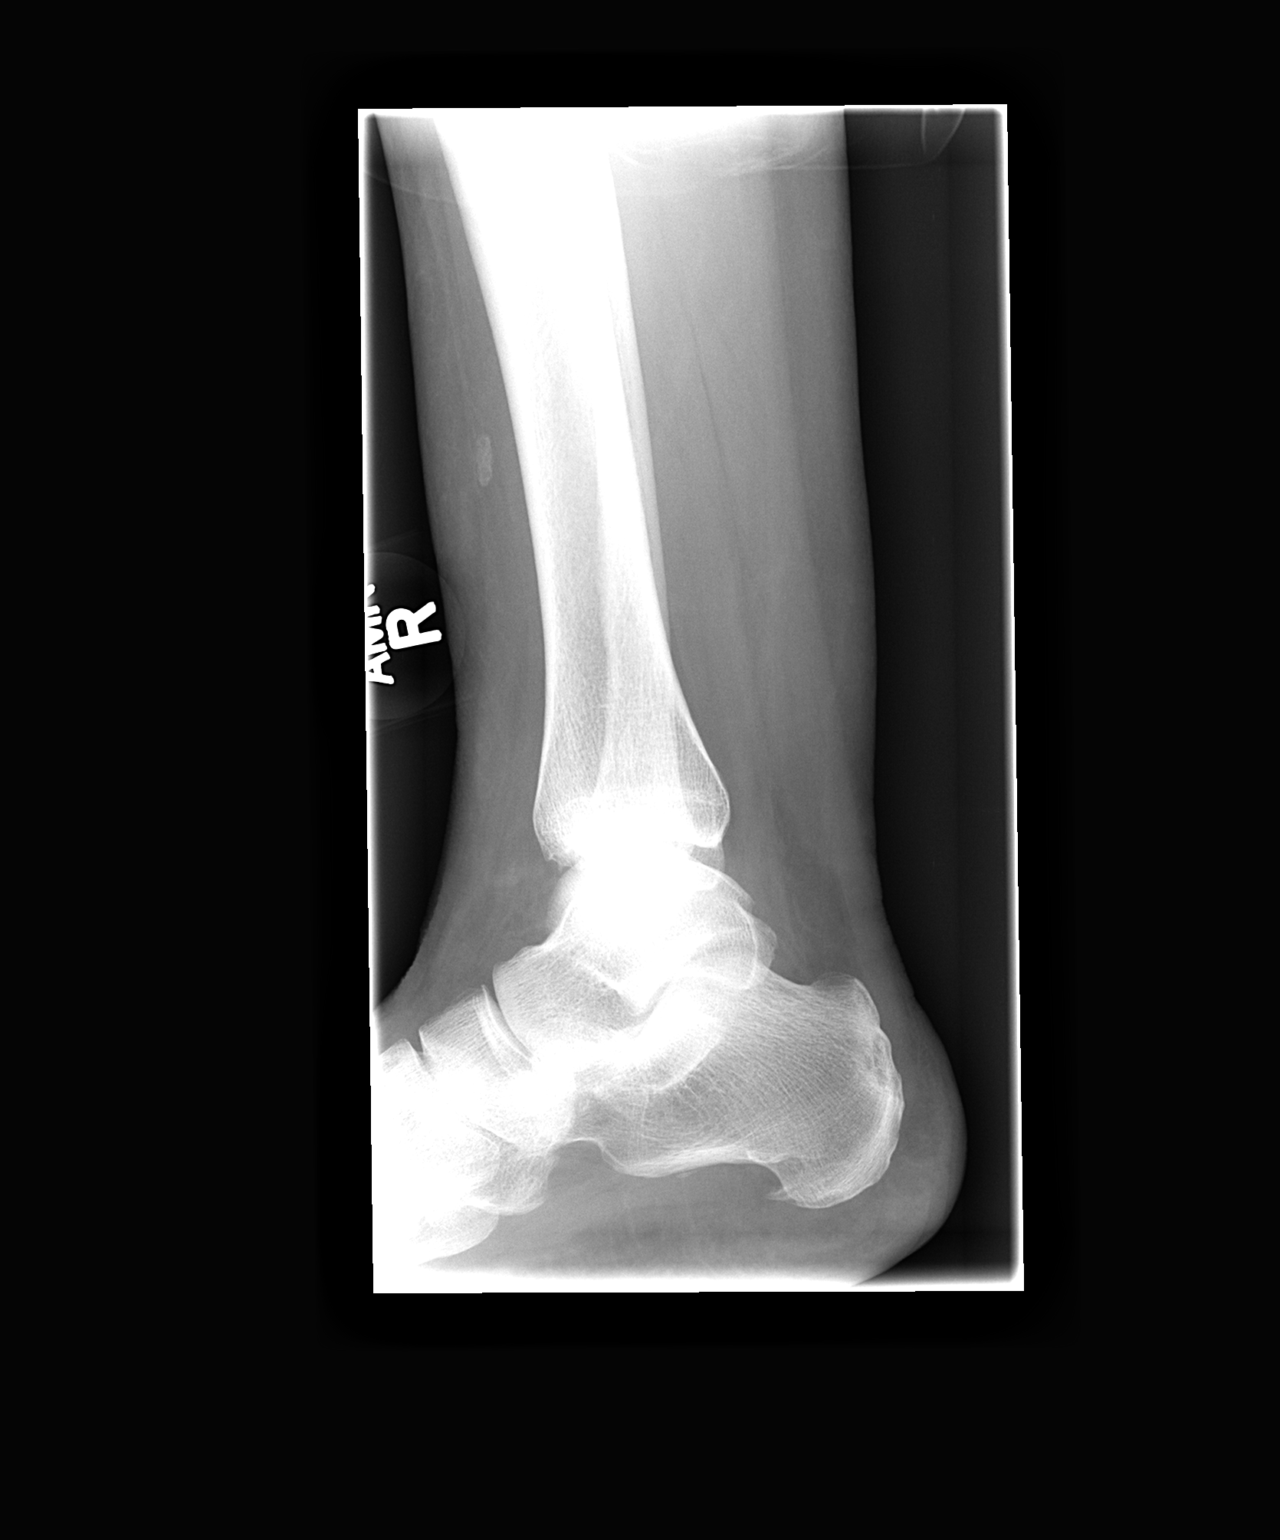

[3 of 3 positions shown; findings below may reference images not displayed]

FINDINGS: Mild soft tissue swelling. No acute bony abnormality. No evidence of
fracture or dislocation. Mild calcaneal spurring .
IMPRESSION: Mild soft tissue swelling.  No acute bony abnormality.

## 2015-08-17 ENCOUNTER — Ambulatory Visit (INDEPENDENT_AMBULATORY_CARE_PROVIDER_SITE_OTHER): Admitting: Psychology

## 2015-08-17 DIAGNOSIS — F419 Anxiety disorder, unspecified: Secondary | ICD-10-CM | POA: Diagnosis not present

## 2015-08-17 DIAGNOSIS — F332 Major depressive disorder, recurrent severe without psychotic features: Secondary | ICD-10-CM

## 2015-08-25 ENCOUNTER — Encounter (HOSPITAL_COMMUNITY): Payer: Self-pay | Admitting: Psychology

## 2015-08-25 NOTE — Progress Notes (Signed)
      PROGRESS NOTE   Patient:  Lauren [name]   DOB: Nov 23, 1950  MR Number: [id number]  Location: [facility] [address] [place] [address] Dept: [phone]  Start: 11 AM End: 12 PM  Provider/Observer:     Edgardo Roys PSYD  Chief Complaint:      Chief Complaint  Patient presents with  . Anxiety  . Depression    Reason For Service:     The patient has a long history of major depressive condition and ongoing underlying depressive disorder. She is a recovering alcoholic but has many many years this Friday. Significant symptoms of anxiety have played a major role in substance abuse in the past. Significant medical problems and her inability to work have complicated her situation. When all these difficulties are put in the context and relationship to potential workability it is clear that she is disabled not do not expect her to return to gainful employment.  Interventions Strategy:  Cognitive/behavioral psychotherapy  Participation Level:   Active  Participation Quality:  Appropriate      Behavioral Observation:  Well Groomed, Alert, and Appropriate.   Current Psychosocial Factors: The patient reports that she has done better and the man that is living at their house is doing well.  She reports that she is freaking out about Trump and this has caused her a lot of stress today.  She reports that she could not sleep last night do to fears about what might happen.  Content of Session:   Review current symptoms and continue to work on therapeutic interventions for improved coping skills and strategies.  Current Status:   The patient reports that she has been doing better is some ways until the past 24 hours.  We worked on Therapist, occupational and using them to cope right now.  Patient Progress:   Stable and improving  Target Goals:   Reduce the frequency of depressive events  including feelings of hopelessness and helplessness, anhedonia, social withdrawal, as well as significant anxiety symptoms related to social avoidance, fear of abandonment and fear of inability to manage and cope with her world. We will also look at continuing complete sobriety.  Last Reviewed:    08/17/2015  Goals Addressed Today:    Target goals addressed today have to do with issues of depression in particular feelings of helplessness and hopelessness.   Impression/Diagnosis:   The patient has a long history of depressive disorder and underlying anxiety disorder. She has had numerous medical issues including severe fibromyalgia and severe arthritis. She has been diagnosed with significant arthritis throughout her body and in particular her hands and her feet. Numerous events and left are overwhelmed and unable to work. The patient has had periodic times of improvement for primary symptoms but there are other times where the symptoms are so problematic that she has not been able to maintain gainful employment.  Diagnosis:    Axis I:  Major depressive disorder, recurrent, severe without psychotic features ([place])  Anxiety    Airyn Ellzey R, PsyD 08/25/2015

## 2015-09-03 ENCOUNTER — Telehealth (HOSPITAL_COMMUNITY): Payer: Self-pay | Admitting: *Deleted

## 2015-09-03 NOTE — Telephone Encounter (Signed)
Pt pharmacy requesting 90 days supply for pt Venlafaxine HCL ER 75 QD dure to insurance only paying for this quantity. Pt medication was last filled 07-23-15. Pt f/u appt is 10-23-15. Pt pharmacy fax number is [phone] and phone number is [phone]

## 2015-09-04 ENCOUNTER — Ambulatory Visit (INDEPENDENT_AMBULATORY_CARE_PROVIDER_SITE_OTHER): Admitting: Psychology

## 2015-09-04 ENCOUNTER — Encounter (HOSPITAL_COMMUNITY): Payer: Self-pay | Admitting: Psychology

## 2015-09-04 ENCOUNTER — Telehealth (HOSPITAL_COMMUNITY): Payer: Self-pay | Admitting: *Deleted

## 2015-09-04 DIAGNOSIS — F332 Major depressive disorder, recurrent severe without psychotic features: Secondary | ICD-10-CM

## 2015-09-04 DIAGNOSIS — F419 Anxiety disorder, unspecified: Secondary | ICD-10-CM | POA: Diagnosis not present

## 2015-09-04 MED ORDER — VENLAFAXINE HCL ER 75 MG PO CP24: 75.0000 mg | ORAL_CAPSULE | Freq: Every day | ORAL | Status: DC

## 2015-09-04 NOTE — Telephone Encounter (Signed)
You may send it in 

## 2015-09-04 NOTE — Progress Notes (Signed)
      PROGRESS NOTE   Patient:  Lauren [name]   DOB: 06/12/1951  MR Number: [id number]  Location: [facility] [address] [place] [address] Dept: [phone]  Start: 9 AM End: 10 AM  Provider/Observer:     Edgardo Roys PSYD  Chief Complaint:      Chief Complaint  Patient presents with  . Anxiety  . Depression  . Stress    Reason For Service:     The patient has a long history of major depressive condition and ongoing underlying depressive disorder. She is a recovering alcoholic but has many many years this Friday. Significant symptoms of anxiety have played a major role in substance abuse in the past. Significant medical problems and her inability to work have complicated her situation. When all these difficulties are put in the context and relationship to potential workability it is clear that she is disabled not do not expect her to return to gainful employment.  Interventions Strategy:  Cognitive/behavioral psychotherapy  Participation Level:   Active  Participation Quality:  Appropriate      Behavioral Observation:  Well Groomed, Alert, and Appropriate.   Current Psychosocial Factors: The patient reports that there is an increased level of stress and frustration with her husband's lack of following doctors recommendations regarding his recovery from a hip fracture and the gentleman living in their house..  Content of Session:   Review current symptoms and continue to work on therapeutic interventions for improved coping skills and strategies.  Current Status:   The patient reports that she feels like she is doing well relative to the situation that is going on around her. Her husband has a fracture in his hip there is nondisplaced but has significant limitations. There is also gentleman living with them who is working on his sobriety from opiates and while he is doing well  with that is causing a lot of stress and increased financial strain. The patient reports that given the circumstances she feels like she is actually doing well relatively speaking but is causing a lot of stress.  Patient Progress:   Stable and improving  Target Goals:   Reduce the frequency of depressive events including feelings of hopelessness and helplessness, anhedonia, social withdrawal, as well as significant anxiety symptoms related to social avoidance, fear of abandonment and fear of inability to manage and cope with her world. We will also look at continuing complete sobriety.  Last Reviewed:    09/04/2015  Goals Addressed Today:    Target goals addressed today have to do with issues of depression in particular feelings of helplessness and hopelessness.   Impression/Diagnosis:   The patient has a long history of depressive disorder and underlying anxiety disorder. She has had numerous medical issues including severe fibromyalgia and severe arthritis. She has been diagnosed with significant arthritis throughout her body and in particular her hands and her feet. Numerous events and left are overwhelmed and unable to work. The patient has had periodic times of improvement for primary symptoms but there are other times where the symptoms are so problematic that she has not been able to maintain gainful employment.  Diagnosis:    Axis I:  Major depressive disorder, recurrent, severe without psychotic features ([place])  Anxiety    Shakeeta Godette R, PsyD 09/04/2015

## 2015-09-04 NOTE — Telephone Encounter (Signed)
Per Dr. Harrington Challenger to send 90 days supply of pt medication to her pharmacy due to their request

## 2015-09-04 NOTE — Telephone Encounter (Signed)
Medication sent.

## 2015-09-24 ENCOUNTER — Ambulatory Visit (INDEPENDENT_AMBULATORY_CARE_PROVIDER_SITE_OTHER): Admitting: Psychology

## 2015-09-24 DIAGNOSIS — F419 Anxiety disorder, unspecified: Secondary | ICD-10-CM | POA: Diagnosis not present

## 2015-09-24 DIAGNOSIS — F332 Major depressive disorder, recurrent severe without psychotic features: Secondary | ICD-10-CM | POA: Diagnosis not present

## 2015-09-25 ENCOUNTER — Encounter (HOSPITAL_COMMUNITY): Payer: Self-pay | Admitting: Psychology

## 2015-09-25 NOTE — Progress Notes (Signed)
      PROGRESS NOTE   Patient:  Lauren [name]   DOB: 08-Nov-1950  MR Number: [id number]  Location: [facility] [address] [place] [address] Dept: [phone]  Start: 9 AM End: 10 AM  Provider/Observer:     Edgardo Roys PSYD  Chief Complaint:      Chief Complaint  Patient presents with  . Anxiety  . Depression  . Stress    Reason For Service:     The patient has a long history of major depressive condition and ongoing underlying depressive disorder. She is a recovering alcoholic but has many many years this Friday. Significant symptoms of anxiety have played a major role in substance abuse in the past. Significant medical problems and her inability to work have complicated her situation. When all these difficulties are put in the context and relationship to potential workability it is clear that she is disabled not do not expect her to return to gainful employment.  Interventions Strategy:  Cognitive/behavioral psychotherapy  Participation Level:   Active  Participation Quality:  Appropriate      Behavioral Observation:  Well Groomed, Alert, and Appropriate.   Current Psychosocial Factors: The patient reports that there is an increased level of stress and frustration with her husband's lack of following doctors recommendations regarding his recovery from a hip fracture and the gentleman living in their house..  Content of Session:   Review current symptoms and continue to work on therapeutic interventions for improved coping skills and strategies.  Current Status:   The patient reports that she feels like she is doing well relative to the situation that is going on around her. Her husband has a fracture in his hip there is nondisplaced but has significant limitations. There is also gentleman living with them who is working on his sobriety from opiates and while he is doing well  with that is causing a lot of stress and increased financial strain. The patient reports that given the circumstances she feels like she is actually doing well relatively speaking but is causing a lot of stress.  Patient Progress:   Stable and improving  Target Goals:   Reduce the frequency of depressive events including feelings of hopelessness and helplessness, anhedonia, social withdrawal, as well as significant anxiety symptoms related to social avoidance, fear of abandonment and fear of inability to manage and cope with her world. We will also look at continuing complete sobriety.  Last Reviewed:    09/24/2015  Goals Addressed Today:    Target goals addressed today have to do with issues of depression in particular feelings of helplessness and hopelessness.   Impression/Diagnosis:   The patient has a long history of depressive disorder and underlying anxiety disorder. She has had numerous medical issues including severe fibromyalgia and severe arthritis. She has been diagnosed with significant arthritis throughout her body and in particular her hands and her feet. Numerous events and left are overwhelmed and unable to work. The patient has had periodic times of improvement for primary symptoms but there are other times where the symptoms are so problematic that she has not been able to maintain gainful employment.  Diagnosis:    Axis I:  Major depressive disorder, recurrent, severe without psychotic features ([place])  Anxiety    Meghanne Pletz R, PsyD 09/25/2015

## 2015-10-08 ENCOUNTER — Encounter (HOSPITAL_COMMUNITY): Payer: Self-pay | Admitting: Psychology

## 2015-10-08 ENCOUNTER — Ambulatory Visit (HOSPITAL_COMMUNITY): Payer: Self-pay | Admitting: Psychology

## 2015-10-12 ENCOUNTER — Ambulatory Visit (INDEPENDENT_AMBULATORY_CARE_PROVIDER_SITE_OTHER): Payer: Self-pay | Admitting: Internal Medicine

## 2015-10-12 ENCOUNTER — Ambulatory Visit: Payer: Self-pay | Admitting: Orthopedic Surgery

## 2015-10-12 NOTE — Progress Notes (Signed)
Preoperative surgical orders have been place into the Epic hospital system for Lauren [name] on 10/12/2015, 3:26 PM  by Mickel Crow for surgery on 10-21-15.  Preop Knee Scope orders including IV Tylenol and IV Decadron as long as there are no contraindications to the above medications. Arlee Muslim, PA-C

## 2015-10-13 ENCOUNTER — Encounter (HOSPITAL_COMMUNITY): Payer: Self-pay

## 2015-10-13 ENCOUNTER — Ambulatory Visit (HOSPITAL_COMMUNITY)
Admission: RE | Admit: 2015-10-13 | Discharge: 2015-10-13 | Disposition: A | Discharge status: Home / Self Care | Payer: Medicare Other | Source: Ambulatory Visit | Attending: Anesthesiology | Admitting: Anesthesiology

## 2015-10-13 ENCOUNTER — Encounter (HOSPITAL_COMMUNITY)
Admission: RE | Admit: 2015-10-13 | Discharge: 2015-10-13 | Disposition: A | Discharge status: Home / Self Care | Payer: Medicare Other | Source: Ambulatory Visit | Attending: Orthopedic Surgery | Admitting: Orthopedic Surgery

## 2015-10-13 DIAGNOSIS — Z01818 Encounter for other preprocedural examination: Secondary | ICD-10-CM | POA: Insufficient documentation

## 2015-10-13 DIAGNOSIS — J40 Bronchitis, not specified as acute or chronic: Secondary | ICD-10-CM

## 2015-10-13 HISTORY — DX: Anxiety disorder, unspecified: F41.9

## 2015-10-13 HISTORY — DX: Reserved for inherently not codable concepts without codable children: IMO0001

## 2015-10-13 HISTORY — DX: Personal history of other diseases of the respiratory system: Z87.09

## 2015-10-13 HISTORY — DX: Dizziness and giddiness: R42

## 2015-10-13 HISTORY — DX: Chronic obstructive pulmonary disease, unspecified: J44.9

## 2015-10-13 HISTORY — DX: Pneumonia, unspecified organism: J18.9

## 2015-10-13 HISTORY — DX: Presence of spectacles and contact lenses: Z97.3

## 2015-10-13 HISTORY — DX: Personal history of other medical treatment: Z92.89

## 2015-10-13 LAB — PROTIME-INR
INR: 0.99 (ref 0.00–1.49)
Prothrombin Time: 13.3 seconds (ref 11.6–15.2)

## 2015-10-13 LAB — COMPREHENSIVE METABOLIC PANEL
ALBUMIN: 4.4 g/dL (ref 3.5–5.0)
ALT: 22 U/L (ref 14–54)
ANION GAP: 10 (ref 5–15)
AST: 25 U/L (ref 15–41)
Alkaline Phosphatase: 51 U/L (ref 38–126)
BILIRUBIN TOTAL: 0.4 mg/dL (ref 0.3–1.2)
BUN: 25 mg/dL — ABNORMAL HIGH (ref 6–20)
CHLORIDE: 103 mmol/L (ref 101–111)
CO2: 28 mmol/L (ref 22–32)
Calcium: 9.9 mg/dL (ref 8.9–10.3)
Creatinine, Ser: 0.75 mg/dL (ref 0.44–1.00)
GFR calc Af Amer: >60 mL/min (ref >60)
GLUCOSE: 100 mg/dL — AB (ref 65–99)
POTASSIUM: 4.4 mmol/L (ref 3.5–5.1)
Sodium: 141 mmol/L (ref 135–145)
TOTAL PROTEIN: 7.8 g/dL (ref 6.5–8.1)

## 2015-10-13 LAB — CBC
HEMATOCRIT: 43.5 % (ref 36.0–46.0)
HEMOGLOBIN: 13.6 g/dL (ref 12.0–15.0)
MCH: 28.6 pg (ref 26.0–34.0)
MCHC: 31.3 g/dL (ref 30.0–36.0)
MCV: 91.4 fL (ref 78.0–100.0)
Platelets: 213 10*3/uL (ref 150–400)
RBC: 4.76 MIL/uL (ref 3.87–5.11)
RDW: 13.3 % (ref 11.5–15.5)
WBC: 7.2 10*3/uL (ref 4.0–10.5)

## 2015-10-13 NOTE — Progress Notes (Signed)
Stress test and ECHO per epic 01/17/2013

## 2015-10-13 NOTE — Patient Instructions (Signed)
Lauren [name]  10/13/2015   Your procedure is scheduled on: Wednesday October 21, 2015  Report to [facility]  Entrance take [place]  elevators to 3rd floor to  [place] at 1:30 PM.  Call this number if you have problems the morning of surgery [phone]   Remember: ONLY 1 PERSON MAY GO WITH YOU TO SHORT STAY TO GET  READY MORNING OF [place].  Do not eat food After Midnight but may take clear liquids till 9:30 am day of surgery then nothing by mouth.      Take these medicines the morning of surgery with A SIP OF WATER: Lorazepam (Ativan) if needed; Oxycodone-Acetaminophen if needed; Ranitidine (Zantac); Venlafaxine (Effexor); Lamotrigine (Lamictal); May use Albuterol Inhaler if needed  DO NOT TAKE ANY DIABETIC MEDICATIONS DAY OF YOUR SURGERY                               You may not have any metal on your body including hair pins and              piercings  Do not wear jewelry, make-up, lotions, powders or perfumes, deodorant             Do not wear nail polish.  Do not shave  48 hours prior to surgery.              Do not bring valuables to the hospital. [place].  Contacts, dentures or bridgework may not be worn into surgery.      Patients discharged the day of surgery will not be allowed to drive home.  Name and phone number of your driver:Steve Hassell Done (brother in law)               Please read over the following fact sheets you were given:INCENTIVE SPIROMETER  _____________________________________________________________________             [facility] - Preparing for Surgery Before surgery, you can play an important role.  Because skin is not sterile, your skin needs to be as free of germs as possible.  You can reduce the number of germs on your skin by washing with CHG (chlorahexidine gluconate) soap before surgery.  CHG is an antiseptic cleaner which kills germs and bonds with the skin  to continue killing germs even after washing. Please DO NOT use if you have an allergy to CHG or antibacterial soaps.  If your skin becomes reddened/irritated stop using the CHG and inform your nurse when you arrive at Short Stay. Do not shave (including legs and underarms) for at least 48 hours prior to the first CHG shower.  You may shave your face/neck. Please follow these instructions carefully:  1.  Shower with CHG Soap the night before surgery and the  morning of Surgery.  2.  If you choose to wash your hair, wash your hair first as usual with your  normal  shampoo.  3.  After you shampoo, rinse your hair and body thoroughly to remove the  shampoo.                           4.  Use CHG as you would any other  liquid soap.  You can apply chg directly  to the skin and wash                       Gently with a scrungie or clean washcloth.  5.  Apply the CHG Soap to your body ONLY FROM THE NECK DOWN.   Do not use on face/ open                           Wound or open sores. Avoid contact with eyes, ears mouth and genitals (private parts).                       Wash face,  Genitals (private parts) with your normal soap.             6.  Wash thoroughly, paying special attention to the area where your surgery  will be performed.  7.  Thoroughly rinse your body with warm water from the neck down.  8.  DO NOT shower/wash with your normal soap after using and rinsing off  the CHG Soap.                9.  Pat yourself dry with a clean towel.            10.  Wear clean pajamas.            11.  Place clean sheets on your bed the night of your first shower and do not  sleep with pets. Day of Surgery : Do not apply any lotions/deodorants the morning of surgery.  Please wear clean clothes to the hospital/surgery center.  FAILURE TO FOLLOW THESE INSTRUCTIONS MAY RESULT IN THE CANCELLATION OF YOUR SURGERY PATIENT SIGNATURE_________________________________  NURSE  SIGNATURE__________________________________  ________________________________________________________________________    CLEAR LIQUID DIET   Foods Allowed                                                                     Foods Excluded  Coffee and tea, regular and decaf                             liquids that you cannot  Plain Jell-O in any flavor                                             see through such as: Fruit ices (not with fruit pulp)                                     milk, soups, orange juice  Iced Popsicles                                    All solid food Carbonated beverages, regular and diet  Cranberry, grape and apple juices Sports drinks like Gatorade Lightly seasoned clear broth or consume(fat free) Sugar, honey syrup  Sample Menu Breakfast                                Lunch                                     Supper Cranberry juice                    Beef broth                            Chicken broth Jell-O                                     Grape juice                           Apple juice Coffee or tea                        Jell-O                                      Popsicle                                                Coffee or tea                        Coffee or tea  _____________________________________________________________________    Incentive Spirometer  An incentive spirometer is a tool that can help keep your lungs clear and active. This tool measures how well you are filling your lungs with each breath. Taking long deep breaths may help reverse or decrease the chance of developing breathing (pulmonary) problems (especially infection) following:  A long period of time when you are unable to move or be active. BEFORE THE PROCEDURE   If the spirometer includes an indicator to show your best effort, your nurse or respiratory therapist will set it to a desired goal.  If possible, sit up straight or lean  slightly forward. Try not to slouch.  Hold the incentive spirometer in an upright position. INSTRUCTIONS FOR USE  1. Sit on the edge of your bed if possible, or sit up as far as you can in bed or on a chair. 2. Hold the incentive spirometer in an upright position. 3. Breathe out normally. 4. Place the mouthpiece in your mouth and seal your lips tightly around it. 5. Breathe in slowly and as deeply as possible, raising the piston or the ball toward the top of the column. 6. Hold your breath for 3-5 seconds or for as long as possible. Allow the piston or ball to fall to the bottom of the column. 7. Remove the mouthpiece from your mouth and breathe out normally. 8. Rest for a few seconds and repeat Steps 1 through 7 at  least 10 times every 1-2 hours when you are awake. Take your time and take a few normal breaths between deep breaths. 9. The spirometer may include an indicator to show your best effort. Use the indicator as a goal to work toward during each repetition. 10. After each set of 10 deep breaths, practice coughing to be sure your lungs are clear. If you have an incision (the cut made at the time of surgery), support your incision when coughing by placing a pillow or rolled up towels firmly against it. Once you are able to get out of bed, walk around indoors and cough well. You may stop using the incentive spirometer when instructed by your caregiver.  RISKS AND COMPLICATIONS  Take your time so you do not get dizzy or light-headed.  If you are in pain, you may need to take or ask for pain medication before doing incentive spirometry. It is harder to take a deep breath if you are having pain. AFTER USE  Rest and breathe slowly and easily.  It can be helpful to keep track of a log of your progress. Your caregiver can provide you with a simple table to help with this. If you are using the spirometer at home, follow these instructions: [place] IF:   You are having difficultly  using the spirometer.  You have trouble using the spirometer as often as instructed.  Your pain medication is not giving enough relief while using the spirometer.  You develop fever of 100.5 F (38.1 C) or higher. SEEK IMMEDIATE MEDICAL CARE IF:   You cough up bloody sputum that had not been present before.  You develop fever of 102 F (38.9 C) or greater.  You develop worsening pain at or near the incision site. MAKE SURE YOU:   Understand these instructions.  Will watch your condition.  Will get help right away if you are not doing well or get worse. Document Released: 01/02/2007 Document Revised: 11/14/2011 Document Reviewed: 03/05/2007 [facility] Patient Information 2014 [place], [place].   ________________________________________________________________________

## 2015-10-14 ENCOUNTER — Ambulatory Visit (INDEPENDENT_AMBULATORY_CARE_PROVIDER_SITE_OTHER): Payer: Self-pay | Admitting: Internal Medicine

## 2015-10-14 LAB — HEMOGLOBIN A1C
Hgb A1c MFr Bld: 6.1 % — ABNORMAL HIGH (ref 4.8–5.6)
Mean Plasma Glucose: 128 mg/dL

## 2015-10-14 NOTE — Progress Notes (Addendum)
CMP and A1C results / epic per PAT visit 10/13/2015 results sent to Dr Wynelle Link

## 2015-10-15 ENCOUNTER — Encounter (INDEPENDENT_AMBULATORY_CARE_PROVIDER_SITE_OTHER): Payer: Self-pay | Admitting: Internal Medicine

## 2015-10-19 ENCOUNTER — Ambulatory Visit (INDEPENDENT_AMBULATORY_CARE_PROVIDER_SITE_OTHER): Payer: 59 | Admitting: Psychology

## 2015-10-19 DIAGNOSIS — F332 Major depressive disorder, recurrent severe without psychotic features: Secondary | ICD-10-CM

## 2015-10-19 DIAGNOSIS — F419 Anxiety disorder, unspecified: Secondary | ICD-10-CM | POA: Diagnosis not present

## 2015-10-21 ENCOUNTER — Ambulatory Visit (HOSPITAL_COMMUNITY): Payer: Medicare Other | Admitting: Anesthesiology

## 2015-10-21 ENCOUNTER — Ambulatory Visit (HOSPITAL_COMMUNITY)
Admission: RE | Admit: 2015-10-21 | Discharge: 2015-10-21 | Disposition: A | Discharge status: Home / Self Care | Payer: Medicare Other | Source: Ambulatory Visit | Attending: Orthopedic Surgery | Admitting: Orthopedic Surgery

## 2015-10-21 ENCOUNTER — Encounter (HOSPITAL_COMMUNITY): Payer: Self-pay | Admitting: *Deleted

## 2015-10-21 ENCOUNTER — Encounter (HOSPITAL_COMMUNITY)
Admission: RE | Disposition: A | Discharge status: Home / Self Care | Payer: Self-pay | Source: Ambulatory Visit | Attending: Orthopedic Surgery

## 2015-10-21 DIAGNOSIS — Z9049 Acquired absence of other specified parts of digestive tract: Secondary | ICD-10-CM | POA: Diagnosis not present

## 2015-10-21 DIAGNOSIS — Z96651 Presence of right artificial knee joint: Secondary | ICD-10-CM | POA: Insufficient documentation

## 2015-10-21 DIAGNOSIS — F329 Major depressive disorder, single episode, unspecified: Secondary | ICD-10-CM | POA: Insufficient documentation

## 2015-10-21 DIAGNOSIS — S83242A Other tear of medial meniscus, current injury, left knee, initial encounter: Secondary | ICD-10-CM | POA: Insufficient documentation

## 2015-10-21 DIAGNOSIS — Z87891 Personal history of nicotine dependence: Secondary | ICD-10-CM | POA: Insufficient documentation

## 2015-10-21 DIAGNOSIS — K219 Gastro-esophageal reflux disease without esophagitis: Secondary | ICD-10-CM | POA: Insufficient documentation

## 2015-10-21 DIAGNOSIS — Z79899 Other long term (current) drug therapy: Secondary | ICD-10-CM | POA: Diagnosis not present

## 2015-10-21 DIAGNOSIS — X58XXXA Exposure to other specified factors, initial encounter: Secondary | ICD-10-CM | POA: Insufficient documentation

## 2015-10-21 DIAGNOSIS — F419 Anxiety disorder, unspecified: Secondary | ICD-10-CM | POA: Diagnosis not present

## 2015-10-21 DIAGNOSIS — Z8619 Personal history of other infectious and parasitic diseases: Secondary | ICD-10-CM | POA: Insufficient documentation

## 2015-10-21 DIAGNOSIS — Z7982 Long term (current) use of aspirin: Secondary | ICD-10-CM | POA: Insufficient documentation

## 2015-10-21 DIAGNOSIS — M797 Fibromyalgia: Secondary | ICD-10-CM | POA: Diagnosis not present

## 2015-10-21 DIAGNOSIS — M1990 Unspecified osteoarthritis, unspecified site: Secondary | ICD-10-CM | POA: Diagnosis not present

## 2015-10-21 DIAGNOSIS — S83249A Other tear of medial meniscus, current injury, unspecified knee, initial encounter: Secondary | ICD-10-CM | POA: Diagnosis present

## 2015-10-21 DIAGNOSIS — J449 Chronic obstructive pulmonary disease, unspecified: Secondary | ICD-10-CM | POA: Diagnosis not present

## 2015-10-21 HISTORY — PX: KNEE ARTHROSCOPY: SHX127

## 2015-10-21 LAB — GLUCOSE, CAPILLARY: Glucose-Capillary: 89 mg/dL (ref 65–99)

## 2015-10-21 SURGERY — ARTHROSCOPY, KNEE
Anesthesia: General | Site: Knee | Laterality: Left

## 2015-10-21 MED ORDER — LACTATED RINGERS IV SOLN: INTRAVENOUS | Status: DC

## 2015-10-21 MED ORDER — ACETAMINOPHEN 10 MG/ML IV SOLN
INTRAVENOUS | Status: AC
  Filled 2015-10-21: qty 100

## 2015-10-21 MED ORDER — FENTANYL CITRATE (PF) 100 MCG/2ML IJ SOLN
INTRAMUSCULAR | Status: AC
  Filled 2015-10-21: qty 2

## 2015-10-21 MED ORDER — FENTANYL CITRATE (PF) 100 MCG/2ML IJ SOLN
INTRAMUSCULAR | Status: DC | PRN
  Administered 2015-10-21 (×2): 50 ug via INTRAVENOUS

## 2015-10-21 MED ORDER — DEXAMETHASONE SODIUM PHOSPHATE 10 MG/ML IJ SOLN: 10.0000 mg | Freq: Once | INTRAMUSCULAR | Status: DC

## 2015-10-21 MED ORDER — LORAZEPAM 1 MG PO TABS: 2.0000 mg | ORAL_TABLET | Freq: Every day | ORAL | Status: DC

## 2015-10-21 MED ORDER — CHLORHEXIDINE GLUCONATE 4 % EX LIQD: 60.0000 mL | Freq: Once | CUTANEOUS | Status: DC

## 2015-10-21 MED ORDER — PHENYLEPHRINE HCL 10 MG/ML IJ SOLN
INTRAMUSCULAR | Status: DC | PRN
  Administered 2015-10-21: 80 ug via INTRAVENOUS

## 2015-10-21 MED ORDER — PROPOFOL 10 MG/ML IV BOLUS
INTRAVENOUS | Status: DC | PRN
  Administered 2015-10-21: 170 mg via INTRAVENOUS

## 2015-10-21 MED ORDER — ACETAMINOPHEN 10 MG/ML IV SOLN
1000.0000 mg | Freq: Once | INTRAVENOUS | Status: AC
  Administered 2015-10-21: 1000 mg via INTRAVENOUS
  Filled 2015-10-21: qty 100

## 2015-10-21 MED ORDER — PROPOFOL 10 MG/ML IV BOLUS
INTRAVENOUS | Status: AC
Start: 2015-10-21 — End: 2015-10-21
  Filled 2015-10-21: qty 20

## 2015-10-21 MED ORDER — DEXAMETHASONE SODIUM PHOSPHATE 10 MG/ML IJ SOLN
INTRAMUSCULAR | Status: AC
  Filled 2015-10-21: qty 1

## 2015-10-21 MED ORDER — CEFAZOLIN SODIUM-DEXTROSE 2-3 GM-% IV SOLR
2.0000 g | INTRAVENOUS | Status: AC
  Administered 2015-10-21: 2 g via INTRAVENOUS

## 2015-10-21 MED ORDER — OXYCODONE HCL 5 MG PO TABS: 5.0000 mg | ORAL_TABLET | ORAL | Status: DC | PRN

## 2015-10-21 MED ORDER — CEFAZOLIN SODIUM-DEXTROSE 2-3 GM-% IV SOLR
INTRAVENOUS | Status: AC
  Filled 2015-10-21: qty 50

## 2015-10-21 MED ORDER — FENTANYL CITRATE (PF) 100 MCG/2ML IJ SOLN
25.0000 ug | INTRAMUSCULAR | Status: DC | PRN
  Administered 2015-10-21 (×3): 25 ug via INTRAVENOUS

## 2015-10-21 MED ORDER — DEXAMETHASONE SODIUM PHOSPHATE 10 MG/ML IJ SOLN
INTRAMUSCULAR | Status: DC | PRN
  Administered 2015-10-21: 10 mg via INTRAVENOUS

## 2015-10-21 MED ORDER — ONDANSETRON HCL 4 MG/2ML IJ SOLN
INTRAMUSCULAR | Status: DC | PRN
  Administered 2015-10-21: 4 mg via INTRAVENOUS

## 2015-10-21 MED ORDER — NALOXONE HCL 0.4 MG/ML IJ SOLN
INTRAMUSCULAR | Status: DC | PRN
  Administered 2015-10-21: .04 mg via INTRAVENOUS

## 2015-10-21 MED ORDER — LIDOCAINE HCL (CARDIAC) 20 MG/ML IV SOLN
INTRAVENOUS | Status: DC | PRN
  Administered 2015-10-21: 100 mg via INTRAVENOUS

## 2015-10-21 MED ORDER — SODIUM CHLORIDE 0.9 % IV SOLN: INTRAVENOUS | Status: DC

## 2015-10-21 MED ORDER — PHENYLEPHRINE 40 MCG/ML (10ML) SYRINGE FOR IV PUSH (FOR BLOOD PRESSURE SUPPORT)
PREFILLED_SYRINGE | INTRAVENOUS | Status: AC
  Filled 2015-10-21: qty 10

## 2015-10-21 MED ORDER — MIDAZOLAM HCL 2 MG/2ML IJ SOLN
INTRAMUSCULAR | Status: AC
  Filled 2015-10-21: qty 2

## 2015-10-21 MED ORDER — BUPIVACAINE-EPINEPHRINE 0.25% -1:200000 IJ SOLN
INTRAMUSCULAR | Status: DC | PRN
  Administered 2015-10-21: 20 mL

## 2015-10-21 MED ORDER — LACTATED RINGERS IV SOLN
INTRAVENOUS | Status: DC
  Administered 2015-10-21: 1000 mL via INTRAVENOUS
  Administered 2015-10-21: 16:00:00 via INTRAVENOUS

## 2015-10-21 MED ORDER — ONDANSETRON HCL 4 MG/2ML IJ SOLN
INTRAMUSCULAR | Status: AC
  Filled 2015-10-21: qty 2

## 2015-10-21 MED ORDER — BUPIVACAINE-EPINEPHRINE (PF) 0.25% -1:200000 IJ SOLN
INTRAMUSCULAR | Status: AC
  Filled 2015-10-21: qty 30

## 2015-10-21 MED ORDER — LACTATED RINGERS IR SOLN
Status: DC | PRN
  Administered 2015-10-21: 9000 mL

## 2015-10-21 MED ORDER — NALOXONE HCL 0.4 MG/ML IJ SOLN
INTRAMUSCULAR | Status: AC
  Filled 2015-10-21: qty 1

## 2015-10-21 MED ORDER — MIDAZOLAM HCL 5 MG/5ML IJ SOLN
INTRAMUSCULAR | Status: DC | PRN
  Administered 2015-10-21: 2 mg via INTRAVENOUS

## 2015-10-21 MED ORDER — OXYCODONE HCL 5 MG PO TABS
5.0000 mg | ORAL_TABLET | ORAL | Status: DC | PRN
  Administered 2015-10-21: 5 mg via ORAL
  Filled 2015-10-21: qty 1

## 2015-10-21 SURGICAL SUPPLY — 34 items
BANDAGE ACE 6X5 VEL STRL LF (GAUZE/BANDAGES/DRESSINGS) ×2 IMPLANT
BLADE 4.2CUDA (BLADE) ×2 IMPLANT
COVER SURGICAL LIGHT HANDLE (MISCELLANEOUS) ×2 IMPLANT
CUFF TOURN SGL QUICK 34 (TOURNIQUET CUFF) ×1
CUFF TRNQT CYL 34X4X40X1 (TOURNIQUET CUFF) ×1 IMPLANT
DRAPE U-SHAPE 47X51 STRL (DRAPES) ×2 IMPLANT
DRSG EMULSION OIL 3X3 NADH (GAUZE/BANDAGES/DRESSINGS) ×2 IMPLANT
DRSG PAD ABDOMINAL 8X10 ST (GAUZE/BANDAGES/DRESSINGS) ×2 IMPLANT
DURAPREP 26ML APPLICATOR (WOUND CARE) ×2 IMPLANT
GAUZE SPONGE 4X4 12PLY STRL (GAUZE/BANDAGES/DRESSINGS) ×2 IMPLANT
GLOVE BIO SURGEON STRL SZ8 (GLOVE) IMPLANT
GLOVE BIOGEL PI IND STRL 7.5 (GLOVE) ×2 IMPLANT
GLOVE BIOGEL PI IND STRL 8 (GLOVE) ×1 IMPLANT
GLOVE BIOGEL PI INDICATOR 7.5 (GLOVE) ×2
GLOVE BIOGEL PI INDICATOR 8 (GLOVE) ×1
GLOVE SURG SS PI 8.0 STRL IVOR (GLOVE) ×2 IMPLANT
GOWN BRE IMP SLV AUR XL STRL (GOWN DISPOSABLE) ×2 IMPLANT
GOWN STRL REUS W/TWL LRG LVL3 (GOWN DISPOSABLE) ×2 IMPLANT
IV LACTATED RINGER IRRG 3000ML (IV SOLUTION) ×3
IV LR IRRIG 3000ML ARTHROMATIC (IV SOLUTION) ×3 IMPLANT
KIT BASIN OR (CUSTOM PROCEDURE TRAY) ×2 IMPLANT
MANIFOLD NEPTUNE II (INSTRUMENTS) ×2 IMPLANT
MARKER SKIN DUAL TIP RULER LAB (MISCELLANEOUS) ×2 IMPLANT
PACK ARTHROSCOPY WL (CUSTOM PROCEDURE TRAY) ×2 IMPLANT
PACK ICE MAXI GEL EZY WRAP (MISCELLANEOUS) ×6 IMPLANT
PADDING CAST ABS 6INX4YD NS (CAST SUPPLIES) ×1
PADDING CAST ABS COTTON 6X4 NS (CAST SUPPLIES) ×1 IMPLANT
PADDING CAST COTTON 6X4 STRL (CAST SUPPLIES) ×4 IMPLANT
POSITIONER SURGICAL ARM (MISCELLANEOUS) ×2 IMPLANT
SUT ETHILON 4 0 PS 2 18 (SUTURE) ×2 IMPLANT
TOWEL OR 17X26 10 PK STRL BLUE (TOWEL DISPOSABLE) ×2 IMPLANT
TUBING ARTHRO INFLOW-ONLY STRL (TUBING) ×2 IMPLANT
WAND HAND CNTRL MULTIVAC 90 (MISCELLANEOUS) ×2 IMPLANT
WRAP KNEE MAXI GEL POST OP (GAUZE/BANDAGES/DRESSINGS) ×2 IMPLANT

## 2015-10-21 NOTE — Interval H&P Note (Signed)
History and Physical Interval Note:  10/21/2015 3:29 PM  Lauren [name]  has presented today for surgery, with the diagnosis of left knee medial menical tear  The various methods of treatment have been discussed with the patient and family. After consideration of risks, benefits and other options for treatment, the patient has consented to  Procedure(s): ARTHROSCOPY LEFT KNEE WITH MENICAL DEBRIDEMENT (Left) as a surgical intervention .  The patient's history has been reviewed, patient examined, no change in status, stable for surgery.  I have reviewed the patient's chart and labs.  Questions were answered to the patient's satisfaction.     Gearlean Alf

## 2015-10-21 NOTE — H&P (Signed)
CC- Lauren [name] is a 65 y.o. female who presents with left knee pain.  HPI- . Knee Pain: Patient presents with knee pain involving the  left knee. Onset of the symptoms was several months ago. Inciting event: none known. Current symptoms include giving out, pain located medially and stiffness. Pain is aggravated by lateral movements, pivoting, rising after sitting, squatting and walking.  Patient has had no prior knee problems. Evaluation to date: MRI: abnormal medial meniscal tear. Treatment to date: prescription NSAIDS which are ineffective.  Past Medical History  Diagnosis Date  . Depression   . Osteoarthritis   . Fibromyalgia   . IBS (irritable bowel syndrome)   . GERD (gastroesophageal reflux disease)   . Prediabetes   . Chronic pain   . Fibromyalgia   . Hepatitis C   . Wears glasses   . Vertigo   . COPD (chronic obstructive pulmonary disease) ([place])   . Shortness of breath dyspnea     allergies; increased pain;   . Pneumonia   . History of bronchitis   . Anxiety   . History of blood transfusion     Past Surgical History  Procedure Laterality Date  . Tonsillectomy    . Cholecystectomy    . Colonoscopy    . Upper gastrointestinal endoscopy    . Mandible surgery  09/06/1979  . Orif wrist fracture Right 07/23/2013    Procedure: OPEN REDUCTION INTERNAL FIXATION (ORIF) WRIST FRACTURE;  Surgeon: Roseanne Kaufman, MD;  Location: [facility];  Service: Orthopedics;  Laterality: Right;  . Colonoscopy N/A 10/23/2014    Procedure: COLONOSCOPY;  Surgeon: Rogene Houston, MD;  Location: [facility];  Service: Endoscopy;  Laterality: N/A;  1030  . Esophagogastroduodenoscopy N/A 10/23/2014    Procedure: ESOPHAGOGASTRODUODENOSCOPY (EGD);  Surgeon: Rogene Houston, MD;  Location: [facility];  Service: Endoscopy;  Laterality: N/A;  . Total knee arthroplasty      2011 rt knee    Prior to Admission medications   Medication Sig Start Date End Date Taking? Authorizing Provider  aspirin EC  81 MG tablet Take 81 mg by mouth daily.   Yes Historical Provider, MD  calcium-vitamin D (OSCAL WITH D) 500-200 MG-UNIT per tablet Take 1 tablet by mouth 2 (two) times daily. For low calcium 03/21/14  Yes Encarnacion Slates, NP  dicyclomine (BENTYL) 20 MG tablet Take 20 mg by mouth every 6 (six) hours as needed for spasms.    Yes Historical Provider, MD  lamoTRIgine (LAMICTAL) 25 MG tablet Take 2 tablets (50 mg total) by mouth 2 (two) times daily. 07/23/15  Yes Cloria Spring, MD  LORazepam (ATIVAN) 1 MG tablet Take 1 tablet (1 mg total) by mouth 3 (three) times daily. Patient taking differently: Take 2 mg by mouth at bedtime.  07/23/15  Yes Cloria Spring, MD  oxyCODONE-acetaminophen (PERCOCET) 10-325 MG per tablet Take 0.5-1 tablets by mouth 4 (four) times daily.  09/17/14  Yes Historical Provider, MD  pravastatin (PRAVACHOL) 20 MG tablet Take 1 tablet (20 mg total) by mouth daily. For high cholesterol 03/21/14  Yes Encarnacion Slates, NP  ranitidine (ZANTAC) 150 MG tablet TAKE 1 TABLET (150 MG TOTAL) BY MOUTH 2 (TWO) TIMES DAILY. 06/15/15  Yes Rogene Houston, MD  venlafaxine XR (EFFEXOR XR) 75 MG 24 hr capsule Take 1 capsule (75 mg total) by mouth daily. 09/04/15 09/03/16 Yes Cloria Spring, MD  albuterol (PROAIR HFA) 108 (90 BASE) MCG/ACT inhaler Inhale 2 puffs into the  lungs every 4 (four) hours as needed for wheezing or shortness of breath. 03/21/14   Encarnacion Slates, NP  diclofenac sodium (VOLTAREN) 1 % GEL Apply 2 g topically 4 (four) times daily as needed (For pain.).  07/09/15   Historical Provider, MD  diphenoxylate-atropine (LOMOTIL) 2.5-0.025 MG per tablet Take 1 tablet by mouth 4 (four) times daily as needed for diarrhea or loose stools. 03/21/14   Encarnacion Slates, NP  Misc Natural Products (OSTEO BI-FLEX/5-LOXIN ADVANCED) TABS Take 1 tablet by mouth daily.    Historical Provider, MD  mupirocin ointment (BACTROBAN) 2 % Apply 1 application topically 3 (three) times daily as needed (Applies to affected  area.).  08/21/15   Historical Provider, MD  promethazine (PHENERGAN) 25 MG tablet Take 25 mg by mouth every 6 (six) hours as needed for nausea or vomiting.    Historical Provider, MD  tiZANidine (ZANAFLEX) 2 MG tablet Take by mouth every 6 (six) hours as needed for muscle spasms.    Historical Provider, MD   KNEE EXAM antalgic gait, soft tissue tenderness over medial joint line, no effusion, negative drawer sign, collateral ligaments intact  Physical Examination: General appearance - alert, well appearing, and in no distress Mental status - alert, oriented to person, place, and time Chest - clear to auscultation, no wheezes, rales or rhonchi, symmetric air entry Heart - normal rate, regular rhythm, normal S1, S2, no murmurs, rubs, clicks or gallops Abdomen - soft, nontender, nondistended, no masses or organomegaly Neurological - alert, oriented, normal speech, no focal findings or movement disorder noted    Asessment/Plan--- Left knee medial meniscal tear- - Plan left knee arthroscopy with meniscal debridement. Procedure risks and potential comps discussed with patient who elects to proceed. Goals are decreased pain and increased function with a high likelihood of achieving both

## 2015-10-21 NOTE — Op Note (Signed)
Preoperative diagnosis-  Left knee medial meniscal tear  Postoperative diagnosis Left- knee medial meniscal tear plus  Chondral defect  Procedure- Left knee arthroscopy with medial  meniscal debridement and chondroplasty   Surgeon- Dione Plover. Mike Berntsen, MD  Anesthesia-General  EBL-  Minimal  Complications- None  Condition- PACU - hemodynamically stable.  Brief clinical note- -Lauren [name] is a 65 y.o.  female with a several month history of left knee pain and mechanical symptoms. Exam and history suggested medial meniscal tear confirmed by MRI. The patient presents now for arthroscopy and debridement   Procedure in detail -       After successful administration of General anesthetic, a tourmiquet is placed high on the Left  thigh and the Left lower extremity is prepped and draped in the usual sterile fashion. Time out is performed by the surgical team. Standard superomedial and inferolateral portal sites are marked and incisions made with an 11 blade. The inflow cannula is passed through the superomedial portal and camera through the inferolateral portal and inflow is initiated. Arthroscopic visualization proceeds.      The undersurface of the patella and trochlea are visualized and there is exposed bone on both surfaces with no unstable cartilage lesions. The medial and lateral gutters are visualized and there are  no loose bodies. Flexion and valgus force is applied to the knee and the medial compartment is entered. A spinal needle is passed into the joint through the site marked for the inferomedial portal. A small incision is made and the dilator passed into the joint. The findings for the medial compartment are unstable degenerative tear medial meniscus with 2 x 2 cm area of exposed bone medial femoral condyle and a surrounding area of unstable cartilage on the medial femoral condyle. There is also a small area of exposed bone on the medial tibial plateau . The tear is debrided to a  stable base with baskets and a shaver and sealed off with the Arthrocare. The shaver is used to debride the unstable cartilage to a stable bony  base with stable edges. It is probed and found to be stable.    The intercondylar notch is visualized and the ACL appears normal. The lateral compartment is entered and the findings are normal .      The joint is again inspected and there are no other tears, defects or loose bodies identified. The arthroscopic equipment is then removed from the inferior portals which are closed with interrupted 4-0 nylon. 20 ml of .25% Marcaine with epinephrine are injected through the inflow cannula and the cannula is then removed and the portal closed with nylon. The incisions are cleaned and dried and a bulky sterile dressing is applied. The patient is then awakened and transported to recovery in stable condition.   10/21/2015, 4:15 PM

## 2015-10-21 NOTE — Transfer of Care (Signed)
Immediate Anesthesia Transfer of Care Note  Patient: [name]  Procedure(s) Performed: Procedure(s): ARTHROSCOPY LEFT KNEE WITH MENICAL DEBRIDEMENT, Chondroplasty (Left)  Patient Location: PACU  Anesthesia Type:General  Level of Consciousness: sedated  Airway & Oxygen Therapy: Patient Spontanous Breathing and Patient connected to face mask oxygen  Post-op Assessment: Report given to RN and Post -op Vital signs reviewed and stable  Post vital signs: Reviewed and stable  Last Vitals:  Filed Vitals:   10/21/15 1348  BP: 124/80  Pulse: 86  Temp: 36.3 C  Resp: 16    Complications: No apparent anesthesia complications

## 2015-10-21 NOTE — Anesthesia Preprocedure Evaluation (Signed)
Anesthesia Evaluation  Patient identified by MRN, date of birth, ID band Patient awake    Reviewed: Allergy & Precautions, H&P , NPO status , Patient's Chart, lab work & pertinent test results  Airway Mallampati: II  TM Distance: >3 FB Neck ROM: full    Dental no notable dental hx. (+) Dental Advisory Given, Teeth Intact   Pulmonary neg pulmonary ROS, shortness of breath and with exertion, COPD,  COPD inhaler, former smoker,    Pulmonary exam normal breath sounds clear to auscultation       Cardiovascular Exercise Tolerance: Good negative cardio ROS Normal cardiovascular exam Rhythm:regular Rate:Normal     Neuro/Psych Anxiety Depression negative neurological ROS  negative psych ROS   GI/Hepatic negative GI ROS, Neg liver ROS, GERD  Medicated and Controlled,(+) Hepatitis -, C  Endo/Other  negative endocrine ROSprediabetes  Renal/GU negative Renal ROS  negative genitourinary   Musculoskeletal  (+) Arthritis , Osteoarthritis,  Fibromyalgia -  Abdominal   Peds  Hematology negative hematology ROS (+)   Anesthesia Other Findings   Reproductive/Obstetrics negative OB ROS                             Anesthesia Physical Anesthesia Plan  ASA: III  Anesthesia Plan: General   Post-op Pain Management:    Induction: Intravenous  Airway Management Planned: LMA  Additional Equipment:   Intra-op Plan:   Post-operative Plan:   Informed Consent: I have reviewed the patients History and Physical, chart, labs and discussed the procedure including the risks, benefits and alternatives for the proposed anesthesia with the patient or authorized representative who has indicated his/her understanding and acceptance.   Dental Advisory Given  Plan Discussed with: CRNA and Surgeon  Anesthesia Plan Comments:         Anesthesia Quick Evaluation

## 2015-10-21 NOTE — Anesthesia Postprocedure Evaluation (Signed)
Anesthesia Post Note  Patient: Lauren [name]  Procedure(s) Performed: Procedure(s) (LRB): ARTHROSCOPY LEFT KNEE WITH MENICAL DEBRIDEMENT, Chondroplasty (Left)  Patient location during evaluation: PACU Anesthesia Type: General Level of consciousness: awake and alert Pain management: pain level controlled Vital Signs Assessment: post-procedure vital signs reviewed and stable Respiratory status: spontaneous breathing, nonlabored ventilation, respiratory function stable and patient connected to nasal cannula oxygen Cardiovascular status: blood pressure returned to baseline and stable Postop Assessment: no signs of nausea or vomiting Anesthetic complications: no    Last Vitals:  Filed Vitals:   10/21/15 1618 10/21/15 1630  BP: 126/77 154/89  Pulse: 85 100  Temp: 36.7 C   Resp: 20 21    Last Pain:  Filed Vitals:   10/21/15 1646  PainSc: 8                  Joniel Graumann L

## 2015-10-21 NOTE — Discharge Instructions (Signed)
° °Dr. Frank Aluisio °[facility]ist °[facility] °[address] °[place], [place] [address] °[phone] ° ° °Arthroscopic Procedure, Knee °An arthroscopic procedure can find what is wrong with your knee. °PROCEDURE °Arthroscopy is a surgical technique that allows your orthopedic surgeon to diagnose and treat your knee injury with accuracy. They will look into your knee through a small instrument. This is almost like a small (pencil sized) telescope. Because arthroscopy affects your knee less than open knee surgery, you can anticipate a more rapid recovery. Taking an active role by following your caregiver's instructions will help with rapid and complete recovery. Use crutches, rest, elevation, ice, and knee exercises as instructed. The length of recovery depends on various factors including type of injury, age, physical condition, medical conditions, and your rehabilitation. °Your knee is the joint between the large bones (femur and tibia) in your leg. Cartilage covers these bone ends which are smooth and slippery and allow your knee to bend and move smoothly. Two menisci, thick, semi-lunar shaped pads of cartilage which form a rim inside the joint, help absorb shock and stabilize your knee. Ligaments bind the bones together and support your knee joint. Muscles move the joint, help support your knee, and take stress off the joint itself. Because of this all programs and physical therapy to rehabilitate an injured or repaired knee require rebuilding and strengthening your muscles. °AFTER THE PROCEDURE °· After the procedure, you will be moved to a recovery area until most of the effects of the medication have worn off. Your caregiver will discuss the test results with you.  °· Only take over-the-counter or prescription medicines for pain, discomfort, or fever as directed by your caregiver.  °SEEK MEDICAL CARE IF:  °· You have increased bleeding from your wounds.  °· You see  redness, swelling, or have increasing pain in your wounds.  °· You have pus coming from your wound.  °· You have an oral temperature above 102° F (38.9° C).  °· You notice a bad smell coming from the wound or dressing.  °· You have severe pain with any motion of your knee.  °SEEK IMMEDIATE MEDICAL CARE IF:  °· You develop a rash.  °· You have difficulty breathing.  °· You have any allergic problems.  °FURTHER INSTRUCTIONS:  °· ICE to the affected knee every three hours for 30 minutes at a time and then as needed for pain and swelling.  Continue to use ice on the knee for pain and swelling from surgery. You may notice swelling that will progress down to the foot and ankle.  This is normal after surgery.  Elevate the leg when you are not up walking on it.   ° °DIET °You may resume your previous home diet once your are discharged from the hospital. ° °DRESSING / WOUND CARE / SHOWERING °You may start showering two days after being discharged home but do not submerge the incisions under water.  °Change dressing 48 hours after the procedure and then cover the small incisions with band aids until your follow up visit. °Change the surgical dressings daily and reapply a dry dressing each time.  ° °ACTIVITY °Walk with your walker as instructed. °Use walker as long as suggested by your caregivers. °Avoid periods of inactivity such as sitting longer than an hour when not asleep. This helps prevent blood clots.  °You may resume a sexual relationship in one month or when given the OK by your doctor.  °You may return to   work once you are cleared by your doctor.  °Do not drive a car for 6 weeks or until released by you surgeon.  °Do not drive while taking narcotics. ° °WEIGHT BEARING AS TOLERATED ° °POSTOPERATIVE CONSTIPATION PROTOCOL °Constipation - defined medically as fewer than three stools per week and severe constipation as less than one stool per week. ° °One of the most common issues patients have following surgery is  constipation.  Even if you have a regular bowel pattern at home, your normal regimen is likely to be disrupted due to multiple reasons following surgery.  Combination of anesthesia, postoperative narcotics, change in appetite and fluid intake all can affect your bowels.  In order to avoid complications following surgery, here are some recommendations in order to help you during your recovery period. ° °Colace (docusate) - Pick up an over-the-counter form of Colace or another stool softener and take twice a day as long as you are requiring postoperative pain medications.  Take with a full glass of water daily.  If you experience loose stools or diarrhea, hold the colace until you stool forms back up.  If your symptoms do not get better within 1 week or if they get worse, check with your doctor. ° °Dulcolax (bisacodyl) - Pick up over-the-counter and take as directed by the product packaging as needed to assist with the movement of your bowels.  Take with a full glass of water.  Use this product as needed if not relieved by Colace only.  ° °MiraLax (polyethylene glycol) - Pick up over-the-counter to have on hand.  MiraLax is a solution that will increase the amount of water in your bowels to assist with bowel movements.  Take as directed and can mix with a glass of water, juice, soda, coffee, or tea.  Take if you go more than two days without a movement. °Do not use MiraLax more than once per day. Call your doctor if you are still constipated or irregular after using this medication for 7 days in a row. ° °If you continue to have problems with postoperative constipation, please contact the office for further assistance and recommendations.  If you experience "the worst abdominal pain ever" or develop nausea or vomiting, please contact the office immediatly for further recommendations for treatment. ° °ITCHING ° If you experience itching with your medications, try taking only a single pain pill, or even half a pain pill  at a time.  You can also use Benadryl over the counter for itching or also to help with sleep.  ° °TED HOSE STOCKINGS °Wear the elastic stockings on both legs for three weeks following surgery during the day but you may remove then at night for sleeping. ° °MEDICATIONS °See your medication summary on the “After Visit Summary” that the nursing staff will review with you prior to discharge.  You may have some home medications which will be placed on hold until you complete the course of blood thinner medication.  It is important for you to complete the blood thinner medication as prescribed by your surgeon.  Continue your approved medications as instructed at time of discharge. °Do not drive while taking narcotics.  ° °PRECAUTIONS °If you experience chest pain or shortness of breath - call 911 immediately for transfer to the hospital emergency department.  °If you develop a fever greater that 101 F, purulent drainage from wound, increased redness or drainage from wound, foul odor from the wound/dressing, or calf pain - CONTACT YOUR SURGEON.   °                                                °  FOLLOW-UP APPOINTMENTS °Make sure you keep all of your appointments after your operation with your surgeon and caregivers. You should call the office at [phone]  and make an appointment for approximately one week after the date of your surgery or on the date instructed by your surgeon outlined in the "After Visit Summary". ° °RANGE OF MOTION AND STRENGTHENING EXERCISES  °Rehabilitation of the knee is important following a knee injury or an operation. After just a few days of immobilization, the muscles of the thigh which control the knee become weakened and shrink (atrophy). Knee exercises are designed to build up the tone and strength of the thigh muscles and to improve knee motion. Often times heat used for twenty to thirty minutes before working out will loosen up your tissues and help with improving the range of motion  but do not use heat for the first two weeks following surgery. These exercises can be done on a training (exercise) mat, on the floor, on a table or on a bed. Use what ever works the best and is most comfortable for you Knee exercises include: ° °QUAD STRENGTHENING EXERCISES °Strengthening Quadriceps Sets ° °Tighten muscles on top of thigh by pushing knees down into floor or table. °Hold for 20 seconds. Repeat 10 times. °Do 2 sessions per day. ° ° ° ° °Strengthening Terminal Knee Extension ° °With knee bent over bolster, straighten knee by tightening muscle on top of thigh. Be sure to keep bottom of knee on bolster. °Hold for 20 seconds. Repeat 10 times. °Do 2 sessions per day. ° ° °Straight Leg with Bent Knee ° °Lie on back with opposite leg bent. Keep involved knee slightly bent at knee and raise leg 4-6". Hold for 10 seconds. °Repeat 20 times per set. °Do 2 sets per session. °Do 2 sessions per day. ° ° ° ° °General Anesthesia, Adult, Care After °Refer to this sheet in the next few weeks. These instructions provide you with information on caring for yourself after your procedure. Your health care provider may also give you more specific instructions. Your treatment has been planned according to current medical practices, but problems sometimes occur. Call your health care provider if you have any problems or questions after your procedure. °WHAT TO EXPECT AFTER THE PROCEDURE °After the procedure, it is typical to experience: °· Sleepiness. °· Nausea and vomiting. °HOME CARE INSTRUCTIONS °· For the first 24 hours after general anesthesia: °¨ Have a responsible person with you. °¨ Do not drive a car. If you are alone, do not take public transportation. °¨ Do not drink alcohol. °¨ Do not take medicine that has not been prescribed by your health care provider. °¨ Do not sign important papers or make important decisions. °¨ You may resume a normal diet and activities as directed by your health care provider. °· Change  bandages (dressings) as directed. °· If you have questions or problems that seem related to general anesthesia, call the hospital and ask for the anesthetist or anesthesiologist on call. °SEEK MEDICAL CARE IF: °· You have nausea and vomiting that continue the day after anesthesia. °· You develop a rash. °SEEK IMMEDIATE MEDICAL CARE IF:  °· You have difficulty breathing. °· You have chest pain. °· You have any allergic problems. °  °This information is not intended to replace advice given to you by your health care provider. Make sure you discuss any questions you have with your health care provider. °  °Document Released: 11/28/2000 Document Revised: 09/12/2014 Document Reviewed:   12/21/2011 °Elsevier Interactive Patient Education ©2016 [employer] ° °

## 2015-10-22 ENCOUNTER — Encounter (HOSPITAL_COMMUNITY): Payer: Self-pay | Admitting: Orthopedic Surgery

## 2015-10-23 ENCOUNTER — Ambulatory Visit (HOSPITAL_COMMUNITY): Payer: Self-pay | Admitting: Psychiatry

## 2015-10-27 ENCOUNTER — Encounter (HOSPITAL_COMMUNITY): Payer: Self-pay | Admitting: Psychology

## 2015-10-27 NOTE — Progress Notes (Signed)
      PROGRESS NOTE   Patient:  Lauren [name]   DOB: 06-May-1951  MR Number: [id number]  Location: [facility] [address] [place] [address] Dept: [phone]  Start:  4 PM End:  5 PM  Provider/Observer:     Edgardo Roys PSYD  Chief Complaint:      Chief Complaint  Patient presents with  . Depression  . Agitation  . Anxiety  . Trauma  . Stress    Reason For Service:     The patient has a long history of major depressive condition and ongoing underlying depressive disorder. She is a recovering alcoholic but has many many years this Friday. Significant symptoms of anxiety have played a major role in substance abuse in the past. Significant medical problems and her inability to work have complicated her situation. When all these difficulties are put in the context and relationship to potential workability it is clear that she is disabled not do not expect her to return to gainful employment.  Interventions Strategy:  Cognitive/behavioral psychotherapy  Participation Level:   Active  Participation Quality:  Appropriate      Behavioral Observation:  Well Groomed, Alert, and Appropriate.   Current Psychosocial Factors: The patient reports that  She is having increasing stress particularly with the situation with her husband. He is continuing to not take care of himself expecting more and more from her. They have a gentleman living with them now who is had difficulty with substance abuse in the past and it took him in to help him get his act back together. At this point the patient is stressed about the open-ended nature of him living there..  Content of Session:   Review current symptoms and continue to work on therapeutic interventions for improved coping skills and strategies.  Current Status:   The patient reports that she has been doing better is some ways until the past 24  hours.  We worked on Therapist, occupational and using them to cope right now.  Patient Progress:   Stable and improving  Target Goals:   Reduce the frequency of depressive events including feelings of hopelessness and helplessness, anhedonia, social withdrawal, as well as significant anxiety symptoms related to social avoidance, fear of abandonment and fear of inability to manage and cope with her world. We will also look at continuing complete sobriety.  Last Reviewed:    08/10/2015  Goals Addressed Today:    Target goals addressed today have to do with issues of depression in particular feelings of helplessness and hopelessness.   Impression/Diagnosis:   The patient has a long history of depressive disorder and underlying anxiety disorder. She has had numerous medical issues including severe fibromyalgia and severe arthritis. She has been diagnosed with significant arthritis throughout her body and in particular her hands and her feet. Numerous events and left are overwhelmed and unable to work. The patient has had periodic times of improvement for primary symptoms but there are other times where the symptoms are so problematic that she has not been able to maintain gainful employment.  Diagnosis:    Axis I:  Major depressive disorder, recurrent, severe without psychotic features ([place])  Anxiety    RODENBOUGH,JOHN R, PsyD 10/27/2015

## 2015-10-29 ENCOUNTER — Telehealth (HOSPITAL_COMMUNITY): Payer: Self-pay | Admitting: *Deleted

## 2015-11-17 ENCOUNTER — Encounter (HOSPITAL_COMMUNITY): Payer: Self-pay | Admitting: Psychiatry

## 2015-11-17 ENCOUNTER — Ambulatory Visit (INDEPENDENT_AMBULATORY_CARE_PROVIDER_SITE_OTHER): Payer: 59 | Admitting: Psychiatry

## 2015-11-17 VITALS — BP 147/85 | HR 86 | Ht 66.0 in | Wt 147.4 lb

## 2015-11-17 DIAGNOSIS — F332 Major depressive disorder, recurrent severe without psychotic features: Secondary | ICD-10-CM | POA: Diagnosis not present

## 2015-11-17 MED ORDER — LAMOTRIGINE 25 MG PO TABS: 50.0000 mg | ORAL_TABLET | Freq: Two times a day (BID) | ORAL | Status: DC

## 2015-11-17 MED ORDER — VENLAFAXINE HCL ER 150 MG PO CP24: 150.0000 mg | ORAL_CAPSULE | Freq: Every day | ORAL | Status: DC

## 2015-11-17 MED ORDER — LORAZEPAM 1 MG PO TABS: 1.0000 mg | ORAL_TABLET | Freq: Three times a day (TID) | ORAL | Status: DC

## 2015-11-17 NOTE — Progress Notes (Signed)
Patient ID: Lauren [name], female   DOB: 1951/04/12, 65 y.o.   MRN: [id number] Patient ID: Lauren [name], female   DOB: 06/01/1951, 65 y.o.   MRN: [id number] Patient ID: Lauren [name], female   DOB: [birth year], 65 y.o.   MRN: [id number] Patient ID: Lauren [name], female   DOB: 1950-11-20, 65 y.o.   MRN: [id number] Patient ID: Lauren [name], female   DOB: 1950/11/16, 65 y.o.   MRN: [id number] Patient ID: Lauren [name], female   DOB: [birth year], 65 y.o.   MRN: [id number] Patient ID: Lauren [name], female   DOB: 07/13/1951, 65 y.o.   MRN: [id number] Patient ID: Lauren [name], female   DOB: 1951/06/08, 65 y.o.   MRN: [id number] Patient ID: Lauren [name], female   DOB: 06/19/1951, 65 y.o.   MRN: [id number] Patient ID: Lauren [name], female   DOB: 06-07-1951, 65 y.o.   MRN: [id number] Patient ID: Lauren [name], female   DOB: March 23, 1951, 65 y.o.   MRN: [id number] Patient ID: Lauren [name], female   DOB: September 30, 1950, 65 y.o.   MRN: [id number] Patient ID: Lauren [name], female   DOB: Feb 10, 1951, 65 y.o.   MRN: [id number] Patient ID: Lauren [name], female   DOB: July 09, 1951, 65 y.o.   MRN: [id number] Patient ID: Lauren [name], female   DOB: October 20, 1950, 65 y.o.   MRN: [id number] Patient ID: Lauren [name], female   DOB: 1951/04/17, 65 y.o.   MRN: [id number] Patient ID: Lauren [name], female   DOB: [birth year], 65 y.o.   MRN: [id number] [place] [id number] Progress Note Lauren [name] MRN: [id number] DOB: [birth year] Age: 65 y.o.  Date: 11/17/2015  Chief Complaint: Chief Complaint  Patient presents with  . Depression  . Manic Behavior  . Anxiety   Subjective: I'm doing ok  History of presenting illness This patient is a 65 year old married white female who lives with her husband. She has one stepchild. Her step son killed himself 7 years ago at age 56. He was an alcoholic. The patient is on disability for fibromyalgia  The patient has a long history of depression. She was last  hospitalized in 2010 after she took too many pain pills and got confused. She use to drink heavily but has been sober 23 years and is very active in [employer] and church. For the most part she feels her mood is stable. She sleeping pretty well. Her energy is good. She has a hard time functioning without Ativan but she only usually takes one pill a day side think this is reasonable. She tried a higher dose of Cymbalta but it made her manic and revved up  The patient returns after 3 months. She recently had knee surgery on her left knee and is currently going through physical therapy. Her husband also broke a separate on Thanksgiving. Fortunately her nephew is living with them and was able to help them out. The patient has been very sad because she had to put her 10 year old dog to sleep last week. She was crying all the time and increased her Effexor XR to 150 mg and states that this is helping. She is sleeping well and her energy is fairly good but she is not manic. She still uses the Ativan for anxiety. Allergies: Allergies  Allergen Reactions  . Elavil [Amitriptyline] Other (See Comments)    Vivid dreams and almost suicidal  . Flagyl [Metronidazole] Itching  . Vistaril [Hydroxyzine Hcl] Other (See Comments)  Got higher than a kite on too much of this.  . Geodon [Ziprasidone Hydrochloride] Other (See Comments)    Blacked out  . Lactose Intolerance (Gi) Other (See Comments)    GI upset  . Latex Other (See Comments)    Red at site  Medical History: Past Medical History  Diagnosis Date  . Depression   . Osteoarthritis   . Fibromyalgia   . IBS (irritable bowel syndrome)   . GERD (gastroesophageal reflux disease)   . Prediabetes   . Chronic pain   . Fibromyalgia   . Hepatitis C   . Wears glasses   . Vertigo   . COPD (chronic obstructive pulmonary disease) (HCC)   . Shortness of breath dyspnea     allergies; increased pain;   . Pneumonia   . History of bronchitis   . Anxiety   . History of  blood transfusion   Surgical History: Past Surgical History  Procedure Laterality Date  . Tonsillectomy    . Cholecystectomy    . Colonoscopy    . Upper gastrointestinal endoscopy    . Mandible surgery  09/06/1979  . Orif wrist fracture Right 07/23/2013    Procedure: OPEN REDUCTION INTERNAL FIXATION (ORIF) WRIST FRACTURE;  Surgeon: William Gramig, MD;  Location: [facility];  Service: Orthopedics;  Laterality: Right;  . Colonoscopy N/A 10/23/2014    Procedure: COLONOSCOPY;  Surgeon: Najeeb U Rehman, MD;  Location: [facility];  Service: Endoscopy;  Laterality: N/A;  1030  . Esophagogastroduodenoscopy N/A 10/23/2014    Procedure: ESOPHAGOGASTRODUODENOSCOPY (EGD);  Surgeon: Najeeb U Rehman, MD;  Location: [facility];  Service: Endoscopy;  Laterality: N/A;  . Total knee arthroplasty      2011 rt knee  . Knee arthroscopy Left 10/21/2015    Procedure: ARTHROSCOPY LEFT KNEE WITH MENICAL DEBRIDEMENT, Chondroplasty;  Surgeon: Frank Aluisio, MD;  Location: [facility];  Service: Orthopedics;  Laterality: Left;  Family History family history includes Alcohol abuse in her cousin, father, maternal grandfather, maternal grandmother, paternal grandfather, paternal grandmother, and paternal uncle; Anxiety disorder in her maternal uncle and paternal uncle; Depression in her father; Diabetes in her brother, brother, and father; Healthy in her brother; Heart disease in her brother; Irritable bowel syndrome in her mother; OCD in her brother and brother; Sexual abuse in her mother; Stroke in her father and mother. There is no history of ADD / ADHD, Bipolar disorder, Dementia, Drug abuse, Paranoid behavior, Schizophrenia, Seizures, or Physical abuse. Reviewed al this again to day in the appointment and nothing has changed  Mental status examination Patient is neatly dressed and groomed. She is walking with a cane and wearing a knee brace  She she states her mood is good and her affect is a little sad She denies paranoia and  she denies any hallucination. She denies suicidal and homicidal ideation her speech is clear and understandable, she no longer has sped up speech or agitation She denies auditory and visual hallucinations or paranoia Her attention and concentration is fair. Her thought process is normal  She's alert and oriented x3 and her insight judgment and impulse control are good  Lab Results:  Results for orders placed or performed during the hospital encounter of 10/21/15 (from the past 8736 hour(s))  Glucose, capillary   Collection Time: 10/21/15  1:45 PM  Result Value Ref Range   Glucose-Capillary 89 65 - 99 mg/dL   Comment 1 Document in Chart   Results for orders placed or performed during the hospital encounter   of 10/13/15 (from the past 8736 hour(s))  Hemoglobin A1c   Collection Time: 10/13/15 10:30 AM  Result Value Ref Range   Hgb A1c MFr Bld 6.1 (H) 4.8 - 5.6 %   Mean Plasma Glucose 128 mg/dL  Comprehensive metabolic panel   Collection Time: 10/13/15 10:30 AM  Result Value Ref Range   Sodium 141 135 - 145 mmol/L   Potassium 4.4 3.5 - 5.1 mmol/L   Chloride 103 101 - 111 mmol/L   CO2 28 22 - 32 mmol/L   Glucose, Bld 100 (H) 65 - 99 mg/dL   BUN 25 (H) 6 - 20 mg/dL   Creatinine, Ser 0.75 0.44 - 1.00 mg/dL   Calcium 9.9 8.9 - 10.3 mg/dL   Total Protein 7.8 6.5 - 8.1 g/dL   Albumin 4.4 3.5 - 5.0 g/dL   AST 25 15 - 41 U/L   ALT 22 14 - 54 U/L   Alkaline Phosphatase 51 38 - 126 U/L   Total Bilirubin 0.4 0.3 - 1.2 mg/dL   GFR calc non Af Amer >60 >60 mL/min   GFR calc Af Amer >60 >60 mL/min   Anion gap 10 5 - 15  CBC   Collection Time: 10/13/15 10:30 AM  Result Value Ref Range   WBC 7.2 4.0 - 10.5 K/uL   RBC 4.76 3.87 - 5.11 MIL/uL   Hemoglobin 13.6 12.0 - 15.0 g/dL   HCT 43.5 36.0 - 46.0 %   MCV 91.4 78.0 - 100.0 fL   MCH 28.6 26.0 - 34.0 pg   MCHC 31.3 30.0 - 36.0 g/dL   RDW 13.3 11.5 - 15.5 %   Platelets 213 150 - 400 K/uL  PT- INR at PAT visit (Pre-admission Testing)    Collection Time: 10/13/15 10:30 AM  Result Value Ref Range   Prothrombin Time 13.3 11.6 - 15.2 seconds   INR 0.99 0.00 - 1.49  Results for orders placed or performed in visit on 04/08/15 (from the past 8736 hour(s))  CBC with Differential/Platelet   Collection Time: 04/08/15  8:40 AM  Result Value Ref Range   WBC 4.8 4.0 - 10.5 K/uL   RBC 4.46 3.87 - 5.11 MIL/uL   Hemoglobin 12.5 12.0 - 15.0 g/dL   HCT 37.6 36.0 - 46.0 %   MCV 84.3 78.0 - 100.0 fL   MCH 28.0 26.0 - 34.0 pg   MCHC 33.2 30.0 - 36.0 g/dL   RDW 13.6 11.5 - 15.5 %   Platelets 220 150 - 400 K/uL   MPV 9.8 8.6 - 12.4 fL   Neutrophils Relative % 46 43 - 77 %   Neutro Abs 2.2 1.7 - 7.7 K/uL   Lymphocytes Relative 41 12 - 46 %   Lymphs Abs 2.0 0.7 - 4.0 K/uL   Monocytes Relative 9 3 - 12 %   Monocytes Absolute 0.4 0.1 - 1.0 K/uL   Eosinophils Relative 3 0 - 5 %   Eosinophils Absolute 0.1 0.0 - 0.7 K/uL   Basophils Relative 1 0 - 1 %   Basophils Absolute 0.0 0.0 - 0.1 K/uL   Smear Review Criteria for review not met   Hepatic function panel   Collection Time: 04/08/15  8:40 AM  Result Value Ref Range   Total Bilirubin 0.3 0.2 - 1.2 mg/dL   Bilirubin, Direct 0.1 <=0.2 mg/dL   Indirect Bilirubin 0.2 0.2 - 1.2 mg/dL   Alkaline Phosphatase 48 33 - 130 U/L   AST 19 10 - 35 U/L  ALT 13 6 - 29 U/L   Total Protein 6.8 6.1 - 8.1 g/dL   Albumin 4.0 3.6 - 5.1 g/dL  Hepatitis C RNA quantitative   Collection Time: 04/08/15  8:40 AM  Result Value Ref Range   HCV Quantitative Not Detected <15 IU/mL   HCV Quantitative Log NOT CALC <1.18 log 10  AFP tumor marker   Collection Time: 04/08/15  9:22 AM  Result Value Ref Range   AFP-Tumor Marker 3.4 <6.1 ng/mL  Results for orders placed or performed in visit on 02/19/15 (from the past 8736 hour(s))  Hepatic function panel   Collection Time: 02/19/15 12:57 PM  Result Value Ref Range   Total Bilirubin 0.5 0.2 - 1.2 mg/dL   Bilirubin, Direct 0.1 0.0 - 0.3 mg/dL   Indirect  Bilirubin 0.4 0.2 - 1.2 mg/dL   Alkaline Phosphatase 60 39 - 117 U/L   AST 21 0 - 37 U/L   ALT 14 0 - 35 U/L   Total Protein 7.5 6.0 - 8.3 g/dL   Albumin 4.2 3.5 - 5.2 g/dL  Results for orders placed or performed in visit on 01/28/15 (from the past 8736 hour(s))  CBC with Differential/Platelet   Collection Time: 02/19/15 12:55 PM  Result Value Ref Range   WBC 6.5 4.0 - 10.5 K/uL   RBC 4.94 3.87 - 5.11 MIL/uL   Hemoglobin 14.0 12.0 - 15.0 g/dL   HCT 42.1 36.0 - 46.0 %   MCV 85.2 78.0 - 100.0 fL   MCH 28.3 26.0 - 34.0 pg   MCHC 33.3 30.0 - 36.0 g/dL   RDW 13.6 11.5 - 15.5 %   Platelets 241 150 - 400 K/uL   MPV 10.3 8.6 - 12.4 fL   Neutrophils Relative % 54 43 - 77 %   Neutro Abs 3.5 1.7 - 7.7 K/uL   Lymphocytes Relative 34 12 - 46 %   Lymphs Abs 2.2 0.7 - 4.0 K/uL   Monocytes Relative 7 3 - 12 %   Monocytes Absolute 0.5 0.1 - 1.0 K/uL   Eosinophils Relative 4 0 - 5 %   Eosinophils Absolute 0.3 0.0 - 0.7 K/uL   Basophils Relative 1 0 - 1 %   Basophils Absolute 0.1 0.0 - 0.1 K/uL   Smear Review Criteria for review not met   Hepatitis C RNA quantitative   Collection Time: 02/19/15 12:59 PM  Result Value Ref Range   HCV Quantitative Not Detected <15 IU/mL   HCV Quantitative Log NOT CALC <1.18 log 10  Results for orders placed or performed in visit on 01/21/15 (from the past 8736 hour(s))  CBC with Differential/Platelet   Collection Time: 01/21/15  3:06 PM  Result Value Ref Range   WBC 5.9 4.0 - 10.5 K/uL   RBC 4.73 3.87 - 5.11 MIL/uL   Hemoglobin 13.2 12.0 - 15.0 g/dL   HCT 39.6 36.0 - 46.0 %   MCV 83.7 78.0 - 100.0 fL   MCH 27.9 26.0 - 34.0 pg   MCHC 33.3 30.0 - 36.0 g/dL   RDW 12.9 11.5 - 15.5 %   Platelets 241 150 - 400 K/uL   MPV 10.7 8.6 - 12.4 fL   Neutrophils Relative % 54 43 - 77 %   Neutro Abs 3.2 1.7 - 7.7 K/uL   Lymphocytes Relative 32 12 - 46 %   Lymphs Abs 1.9 0.7 - 4.0 K/uL   Monocytes Relative 9 3 - 12 %   Monocytes Absolute 0.5 0.1 - 1.0   K/uL    Eosinophils Relative 4 0 - 5 %   Eosinophils Absolute 0.2 0.0 - 0.7 K/uL   Basophils Relative 1 0 - 1 %   Basophils Absolute 0.1 0.0 - 0.1 K/uL   Smear Review Criteria for review not met   Hepatic function panel   Collection Time: 01/21/15  3:06 PM  Result Value Ref Range   Total Bilirubin 0.2 0.2 - 1.2 mg/dL   Bilirubin, Direct 0.1 0.0 - 0.3 mg/dL   Indirect Bilirubin 0.1 (L) 0.2 - 1.2 mg/dL   Alkaline Phosphatase 56 39 - 117 U/L   AST 16 0 - 37 U/L   ALT 17 0 - 35 U/L   Total Protein 7.1 6.0 - 8.3 g/dL   Albumin 4.0 3.5 - 5.2 g/dL  Hepatitis C RNA quantitative   Collection Time: 01/21/15  3:06 PM  Result Value Ref Range   HCV Quantitative Not Detected <15 IU/mL   HCV Quantitative Log NOT CALC <1.18 log 10  Family doctor is following her labs  Assessment Axis I Maj. depressive disorder, insomnia, pain issues, HIstory of alcoholism. Axis II deferred Axis III see medical history Axis IV mild to moderate  Plan: She will continue Lamictal 50 mg twice a day for mood stabilization. She'll continue Effexor XR 150 mg daily for depression. She will also continue Ativan1 mg 3 times a day for anxiety. She is no longer using trazodone She'll return in 3 months  MEDICATIONS this encounter: Meds ordered this encounter  Medications  . oxyCODONE-acetaminophen (PERCOCET) 10-325 MG tablet    Sig:   . lamoTRIgine (LAMICTAL) 25 MG tablet    Sig: Take 2 tablets (50 mg total) by mouth 2 (two) times daily.    Dispense:  120 tablet    Refill:  2  . venlafaxine XR (EFFEXOR XR) 150 MG 24 hr capsule    Sig: Take 1 capsule (150 mg total) by mouth daily with breakfast.    Dispense:  30 capsule    Refill:  2  . LORazepam (ATIVAN) 1 MG tablet    Sig: Take 1 tablet (1 mg total) by mouth 3 (three) times daily.    Dispense:  90 tablet    Refill:  2    Medical Decision Making Problem Points:  Established problem, worsening (2), Review of last therapy session (1) and Review of psycho-social  stressors (1) Data Points:  Review or order clinical lab tests (1) Review of medication regiment & side effects (2) Review of new medications or change in dosage (2)  I certify that outpatient services furnished can reasonably be expected to improve the patient's condition.   ROSS, DEBORAH, MD 

## 2015-11-18 ENCOUNTER — Ambulatory Visit (INDEPENDENT_AMBULATORY_CARE_PROVIDER_SITE_OTHER): Payer: 59 | Admitting: Psychology

## 2015-11-18 DIAGNOSIS — F419 Anxiety disorder, unspecified: Secondary | ICD-10-CM

## 2015-11-18 DIAGNOSIS — F332 Major depressive disorder, recurrent severe without psychotic features: Secondary | ICD-10-CM

## 2015-12-02 ENCOUNTER — Ambulatory Visit (HOSPITAL_COMMUNITY): Payer: Self-pay | Admitting: Psychology

## 2015-12-16 ENCOUNTER — Ambulatory Visit (INDEPENDENT_AMBULATORY_CARE_PROVIDER_SITE_OTHER): Admitting: Psychology

## 2015-12-16 DIAGNOSIS — F332 Major depressive disorder, recurrent severe without psychotic features: Secondary | ICD-10-CM | POA: Diagnosis not present

## 2015-12-16 DIAGNOSIS — F419 Anxiety disorder, unspecified: Secondary | ICD-10-CM | POA: Diagnosis not present

## 2016-01-07 ENCOUNTER — Ambulatory Visit (HOSPITAL_COMMUNITY): Payer: Self-pay | Admitting: Psychology

## 2016-01-21 ENCOUNTER — Ambulatory Visit (HOSPITAL_COMMUNITY): Payer: Self-pay | Admitting: Psychology

## 2016-01-25 ENCOUNTER — Ambulatory Visit (INDEPENDENT_AMBULATORY_CARE_PROVIDER_SITE_OTHER): Payer: 59 | Admitting: Psychology

## 2016-01-25 DIAGNOSIS — F332 Major depressive disorder, recurrent severe without psychotic features: Secondary | ICD-10-CM

## 2016-01-25 DIAGNOSIS — F419 Anxiety disorder, unspecified: Secondary | ICD-10-CM | POA: Diagnosis not present

## 2016-01-26 ENCOUNTER — Encounter (HOSPITAL_COMMUNITY): Payer: Self-pay | Admitting: Psychology

## 2016-01-26 NOTE — Progress Notes (Signed)
      PROGRESS NOTE   Patient:  Lauren [name]   DOB: [birth year]  MR Number: [id number]  Location: [facility] [name] [place] [address] Dept: [phone]  Start: 9 AM End: 10 AM  Provider/Observer:     Edgardo Roys PSYD  Chief Complaint:      Chief Complaint  Patient presents with  . Anxiety  . Agitation  . Depression  . Stress    Reason For Service:     The patient has a long history of major depressive condition and ongoing underlying depressive disorder. She is a recovering alcoholic but has many many years this Friday. Significant symptoms of anxiety have played a major role in substance abuse in the past. Significant medical problems and her inability to work have complicated her situation. When all these difficulties are put in the context and relationship to potential workability it is clear that she is disabled not do not expect her to return to gainful employment.  Interventions Strategy:  Cognitive/behavioral psychotherapy  Participation Level:   Active  Participation Quality:  Appropriate      Behavioral Observation:  Well Groomed, Alert, and Appropriate.   Current Psychosocial Factors: The patient reports that there is an increased level of stress and frustration with her husband's lack of following doctors recommendations regarding his recovery from a hip fracture and the gentleman living in their house..  Content of Session:   Review current symptoms and continue to work on therapeutic interventions for improved coping skills and strategies.  Current Status:   The patient reports that she feels like she is doing well relative to the situation that is going on around her. Her husband has a fracture in his hip there is nondisplaced but has significant limitations. There is also gentleman living with them who is working on his sobriety from opiates and while he  is doing well with that is causing a lot of stress and increased financial strain. The patient reports that given the circumstances she feels like she is actually doing well relatively speaking but is causing a lot of stress.  Patient Progress:   Stable and improving  Target Goals:   Reduce the frequency of depressive events including feelings of hopelessness and helplessness, anhedonia, social withdrawal, as well as significant anxiety symptoms related to social avoidance, fear of abandonment and fear of inability to manage and cope with her world. We will also look at continuing complete sobriety.  Last Reviewed:    01/25/2016  Goals Addressed Today:    Target goals addressed today have to do with issues of depression in particular feelings of helplessness and hopelessness.   Impression/Diagnosis:   The patient has a long history of depressive disorder and underlying anxiety disorder. She has had numerous medical issues including severe fibromyalgia and severe arthritis. She has been diagnosed with significant arthritis throughout her body and in particular her hands and her feet. Numerous events and left are overwhelmed and unable to work. The patient has had periodic times of improvement for primary symptoms but there are other times where the symptoms are so problematic that she has not been able to maintain gainful employment.  Diagnosis:    Axis I:  Major depressive disorder, recurrent, severe without psychotic features ([place])  Anxiety    Leylah Tarnow R, PsyD 01/26/2016

## 2016-02-08 ENCOUNTER — Telehealth (HOSPITAL_COMMUNITY): Payer: Self-pay | Admitting: *Deleted

## 2016-02-08 NOTE — Telephone Encounter (Signed)
Provider out of office weeks of 02/29/16-03/14/16.

## 2016-02-10 ENCOUNTER — Encounter (HOSPITAL_COMMUNITY): Payer: Self-pay | Admitting: Psychology

## 2016-02-10 NOTE — Progress Notes (Signed)
       PROGRESS NOTE   Patient:  Lauren [name]   DOB: 1950/09/15  MR Number: [id number]  Location: [facility] [address] [place] [address] Dept: [phone]  Start: 2 PM End: 3 PM  Provider/Observer:     Edgardo Roys PSYD  Chief Complaint:      Chief Complaint  Patient presents with  . Depression  . Anxiety  . Agitation  . Stress    Reason For Service:     The patient has a long history of major depressive condition and ongoing underlying depressive disorder. She is a recovering alcoholic but has many many years this Friday. Significant symptoms of anxiety have played a major role in substance abuse in the past. Significant medical problems and her inability to work have complicated her situation. When all these difficulties are put in the context and relationship to potential workability it is clear that she is disabled not do not expect her to return to gainful employment.  Interventions Strategy:  Cognitive/behavioral psychotherapy  Participation Level:   Active  Participation Quality:  Appropriate      Behavioral Observation:  Well Groomed, Alert, and Appropriate.   Current Psychosocial Factors: The patient reports that there is an increased level of stress and frustration with her husband's lack of following doctors recommendations regarding his recovery from a hip fracture and the gentleman living in their house..  Content of Session:   Review current symptoms and continue to work on therapeutic interventions for improved coping skills and strategies.  Current Status:   The patient reports that she feels like she is doing well relative to the situation that is going on around her. Her husband has a fracture in his hip there is nondisplaced but has significant limitations. There is also gentleman living with them who is working on his sobriety from opiates and while he  is doing well with that is causing a lot of stress and increased financial strain. The patient reports that given the circumstances she feels like she is actually doing well relatively speaking but is causing a lot of stress.  Patient Progress:   Stable and improving  Target Goals:   Reduce the frequency of depressive events including feelings of hopelessness and helplessness, anhedonia, social withdrawal, as well as significant anxiety symptoms related to social avoidance, fear of abandonment and fear of inability to manage and cope with her world. We will also look at continuing complete sobriety.  Last Reviewed:    10/19/2015  Goals Addressed Today:    Target goals addressed today have to do with issues of depression in particular feelings of helplessness and hopelessness.   Impression/Diagnosis:   The patient has a long history of depressive disorder and underlying anxiety disorder. She has had numerous medical issues including severe fibromyalgia and severe arthritis. She has been diagnosed with significant arthritis throughout her body and in particular her hands and her feet. Numerous events and left are overwhelmed and unable to work. The patient has had periodic times of improvement for primary symptoms but there are other times where the symptoms are so problematic that she has not been able to maintain gainful employment.  Diagnosis:    Axis I:  Major depressive disorder, recurrent, severe without psychotic features ([place])  Anxiety    Janeka Libman R, PsyD 02/10/2016

## 2016-02-10 NOTE — Progress Notes (Signed)
       PROGRESS NOTE   Patient:  Lauren [name]   DOB: [birth year]  MR Number: [id number]  Location: [facility] [address] [place] [address] Dept: [phone]  Start: 2 PM End: 3 PM  Provider/Observer:     Edgardo Roys PSYD  Chief Complaint:      Chief Complaint  Patient presents with  . Depression  . Anxiety  . Trauma  . Stress  . Agitation    Reason For Service:     The patient has a long history of major depressive condition and ongoing underlying depressive disorder. She is a recovering alcoholic but has many many years this Friday. Significant symptoms of anxiety have played a major role in substance abuse in the past. Significant medical problems and her inability to work have complicated her situation. When all these difficulties are put in the context and relationship to potential workability it is clear that she is disabled not do not expect her to return to gainful employment.  Interventions Strategy:  Cognitive/behavioral psychotherapy  Participation Level:   Active  Participation Quality:  Appropriate      Behavioral Observation:  Well Groomed, Alert, and Appropriate.   Current Psychosocial Factors: The patient reports that there is an increased level of stress and frustration with her husband's lack of following doctors recommendations regarding his recovery from a hip fracture and the gentleman living in their house..  Content of Session:   Review current symptoms and continue to work on therapeutic interventions for improved coping skills and strategies.  Current Status:   The patient reports that she feels like she is doing well relative to the situation that is going on around her. Her husband has a fracture in his hip there is nondisplaced but has significant limitations. There is also gentleman living with them who is working on his sobriety from opiates  and while he is doing well with that is causing a lot of stress and increased financial strain. The patient reports that given the circumstances she feels like she is actually doing well relatively speaking but is causing a lot of stress.  Patient Progress:   Stable and improving  Target Goals:   Reduce the frequency of depressive events including feelings of hopelessness and helplessness, anhedonia, social withdrawal, as well as significant anxiety symptoms related to social avoidance, fear of abandonment and fear of inability to manage and cope with her world. We will also look at continuing complete sobriety.  Last Reviewed:   11/18/2015   Goals Addressed Today:    Target goals addressed today have to do with issues of depression in particular feelings of helplessness and hopelessness.   Impression/Diagnosis:   The patient has a long history of depressive disorder and underlying anxiety disorder. She has had numerous medical issues including severe fibromyalgia and severe arthritis. She has been diagnosed with significant arthritis throughout her body and in particular her hands and her feet. Numerous events and left are overwhelmed and unable to work. The patient has had periodic times of improvement for primary symptoms but there are other times where the symptoms are so problematic that she has not been able to maintain gainful employment.  Diagnosis:    Axis I:  Major depressive disorder, recurrent, severe without psychotic features ([place])  Anxiety    Harmonee Tozer R, PsyD 02/10/2016

## 2016-02-17 ENCOUNTER — Encounter (HOSPITAL_COMMUNITY): Payer: Self-pay | Admitting: Psychiatry

## 2016-02-17 ENCOUNTER — Ambulatory Visit (INDEPENDENT_AMBULATORY_CARE_PROVIDER_SITE_OTHER): Payer: 59 | Admitting: Psychiatry

## 2016-02-17 VITALS — BP 158/85 | HR 101 | Ht 66.0 in | Wt 144.6 lb

## 2016-02-17 DIAGNOSIS — G4700 Insomnia, unspecified: Secondary | ICD-10-CM | POA: Diagnosis not present

## 2016-02-17 DIAGNOSIS — F419 Anxiety disorder, unspecified: Secondary | ICD-10-CM

## 2016-02-17 DIAGNOSIS — F332 Major depressive disorder, recurrent severe without psychotic features: Secondary | ICD-10-CM | POA: Diagnosis not present

## 2016-02-17 MED ORDER — LORAZEPAM 1 MG PO TABS: 1.0000 mg | ORAL_TABLET | Freq: Two times a day (BID) | ORAL | Status: DC

## 2016-02-17 MED ORDER — LAMOTRIGINE 25 MG PO TABS: 50.0000 mg | ORAL_TABLET | Freq: Two times a day (BID) | ORAL | Status: DC

## 2016-02-17 MED ORDER — VENLAFAXINE HCL ER 150 MG PO CP24: 150.0000 mg | ORAL_CAPSULE | Freq: Every day | ORAL | Status: DC

## 2016-02-17 MED ORDER — LORAZEPAM 1 MG PO TABS: 1.0000 mg | ORAL_TABLET | Freq: Three times a day (TID) | ORAL | Status: DC

## 2016-02-17 NOTE — Progress Notes (Signed)
Patient ID: Lauren [name], female   DOB: 1951-04-28, 65 y.o.   MRN: [id number] Patient ID: Lauren [name], female   DOB: Apr 02, 1951, 65 y.o.   MRN: [id number] Patient ID: Lauren [name], female   DOB: 11-11-1950, 65 y.o.   MRN: [id number] Patient ID: Lauren [name], female   DOB: 01-16-1951, 65 y.o.   MRN: [id number] Patient ID: Lauren [name], female   DOB: 04-26-1951, 65 y.o.   MRN: [id number] Patient ID: Lauren [name], female   DOB: 01/05/1951, 65 y.o.   MRN: [id number] Patient ID: Lauren [name], female   DOB: Jul 12, 1951, 65 y.o.   MRN: [id number] Patient ID: Lauren [name], female   DOB: 01/22/1951, 65 y.o.   MRN: [id number] Patient ID: Lauren [name], female   DOB: 1951-07-02, 64 y.o.   MRN: [id number] Patient ID: Lauren [name], female   DOB: 10-Feb-1951, 65 y.o.   MRN: [id number] Patient ID: Lauren [name], female   DOB: August 19, 1951, 65 y.o.   MRN: [id number] Patient ID: Lauren [name], female   DOB: 1951-06-21, 65 y.o.   MRN: [id number] Patient ID: Lauren [name], female   DOB: 10-Jul-1951, 65 y.o.   MRN: [id number] Patient ID: Lauren [name], female   DOB: 1951-02-19, 65 y.o.   MRN: [id number] Patient ID: Lauren [name], female   DOB: 07-08-1951, 65 y.o.   MRN: [id number] Patient ID: Lauren [name], female   DOB: [birth year], 65 y.o.   MRN: [id number] Patient ID: Lauren [name], female   DOB: 07-31-1951, 65 y.o.   MRN: [id number] Patient ID: Lauren [name], female   DOB: 04-22-1951, 65 y.o.   MRN: [id number] [place] [id number] Progress Note Lauren [name] MRN: [id number] DOB: 03-Feb-1951 Age: 65 y.o.  Date: 02/17/2016  Chief Complaint: Chief Complaint  Patient presents with  . Depression  . Anxiety  . Manic Behavior  . Follow-up   Subjective: I'm doing ok  History of presenting illness This patient is a 65 year old married white female who lives with her husband. She has one stepchild. Her step son killed himself 7 years ago at age 44. He was an alcoholic. The patient is on  disability for fibromyalgia  The patient has a long history of depression. She was last hospitalized in 2010 after she took too many pain pills and got confused. She use to drink heavily but has been sober 23 years and is very active in [employer] and church. For the most part she feels her mood is stable. She sleeping pretty well. Her energy is good. She has a hard time functioning without Ativan but she only usually takes one pill a day side think this is reasonable. She tried a higher dose of Cymbalta but it made her manic and revved up  The patient returns after 3 months. She has been stressed because her husband broke his pelvis back in November and is found to have severe osteoporosis. He is getting treatment from orthopedics and endocrine. He is also getting pain management and she has been policing his pain meds because he will constantly ask for more. He also takes Xanax or Ativan prescribed by the [facility]. She's very worried about him. I explained to her that there is only so much she can do about this and she realizes this but she is quite stressed. She was thinking of cutting back her Effexor but I told her this was not a good time and she agrees. She herself is  cut way back on pain medicine and only takes one Ativan at bedtime for the most part. Other than stressing about her husband she's doing fairly well Allergies: Allergies  Allergen Reactions  . Elavil [Amitriptyline] Other (See Comments)    Vivid dreams and almost suicidal  . Flagyl [Metronidazole] Itching  . Vistaril [Hydroxyzine Hcl] Other (See Comments)    Got higher than a kite on too much of this.  Lindajo Royal [Ziprasidone Hydrochloride] Other (See Comments)    Blacked out  . Lactose Intolerance (Gi) Other (See Comments)    GI upset  . Latex Other (See Comments)    Red at site  Medical History: Past Medical History  Diagnosis Date  . Depression   . Osteoarthritis   . Fibromyalgia   . IBS (irritable bowel syndrome)   .  GERD (gastroesophageal reflux disease)   . Prediabetes   . Chronic pain   . Fibromyalgia   . Hepatitis C   . Wears glasses   . Vertigo   . COPD (chronic obstructive pulmonary disease) ([place])   . Shortness of breath dyspnea     allergies; increased pain;   . Pneumonia   . History of bronchitis   . Anxiety   . History of blood transfusion   Surgical History: Past Surgical History  Procedure Laterality Date  . Tonsillectomy    . Cholecystectomy    . Colonoscopy    . Upper gastrointestinal endoscopy    . Mandible surgery  09/06/1979  . Orif wrist fracture Right 07/23/2013    Procedure: OPEN REDUCTION INTERNAL FIXATION (ORIF) WRIST FRACTURE;  Surgeon: Roseanne Kaufman, MD;  Location: [facility];  Service: Orthopedics;  Laterality: Right;  . Colonoscopy N/A 10/23/2014    Procedure: COLONOSCOPY;  Surgeon: Rogene Houston, MD;  [facility];  Service: Endoscopy;  Laterality: N/A;  1030  . Esophagogastroduodenoscopy N/A 10/23/2014    Procedure: ESOPHAGOGASTRODUODENOSCOPY (EGD);  Surgeon: Rogene Houston, MD;  [facility];  Service: Endoscopy;  Laterality: N/A;  . Total knee arthroplasty      2011 rt knee  . Knee arthroscopy Left 10/21/2015    Procedure: ARTHROSCOPY LEFT KNEE WITH MENICAL DEBRIDEMENT, Chondroplasty;  Surgeon: Gaynelle Arabian, MD;  Location: [facility];  Service: Orthopedics;  Laterality: Left;  Family History family history includes Alcohol abuse in her cousin, father, maternal grandfather, maternal grandmother, paternal grandfather, paternal grandmother, and paternal uncle; Anxiety disorder in her maternal uncle and paternal uncle; Depression in her father; Diabetes in her brother, brother, and father; Healthy in her brother; Heart disease in her brother; Irritable bowel syndrome in her mother; OCD in her brother and brother; Sexual abuse in her mother; Stroke in her father and mother. There is no history of ADD / ADHD, Bipolar disorder, Dementia, Drug abuse, Paranoid  behavior, Schizophrenia, Seizures, or Physical abuse. Reviewed al this again to day in the appointment and nothing has changed  Mental status examination Patient is neatly dressed and groomed. She is walking with a cane and wearing a knee brace  She she states her mood is Anxious and her affect is congruent She denies paranoia and she denies any hallucination. She denies suicidal and homicidal ideation her speech is clear and understandable, she no longer has sped up speech or agitation She denies auditory and visual hallucinations or paranoia Her attention and concentration is fair. Her thought process is normal  She's alert and oriented x3 and her insight judgment and impulse control are good  Lab Results:  Results for orders placed or performed during the hospital encounter of 10/21/15 (from the past 8736 hour(s))  Glucose, capillary   Collection Time: 10/21/15  1:45 PM  Result Value Ref Range   Glucose-Capillary 89 65 - 99 mg/dL   Comment 1 Document in Chart   Results for orders placed or performed during the hospital encounter of 10/13/15 (from the past 8736 hour(s))  Hemoglobin A1c   Collection Time: 10/13/15 10:30 AM  Result Value Ref Range   Hgb A1c MFr Bld 6.1 (H) 4.8 - 5.6 %   Mean Plasma Glucose 128 mg/dL  Comprehensive metabolic panel   Collection Time: 10/13/15 10:30 AM  Result Value Ref Range   Sodium 141 135 - 145 mmol/L   Potassium 4.4 3.5 - 5.1 mmol/L   Chloride 103 101 - 111 mmol/L   CO2 28 22 - 32 mmol/L   Glucose, Bld 100 (H) 65 - 99 mg/dL   BUN 25 (H) 6 - 20 mg/dL   Creatinine, Ser 0.75 0.44 - 1.00 mg/dL   Calcium 9.9 8.9 - 10.3 mg/dL   Total Protein 7.8 6.5 - 8.1 g/dL   Albumin 4.4 3.5 - 5.0 g/dL   AST 25 15 - 41 U/L   ALT 22 14 - 54 U/L   Alkaline Phosphatase 51 38 - 126 U/L   Total Bilirubin 0.4 0.3 - 1.2 mg/dL   GFR calc non Af Amer >60 >60 mL/min   GFR calc Af Amer >60 >60 mL/min   Anion gap 10 5 - 15  CBC   Collection Time: 10/13/15 10:30 AM  Result  Value Ref Range   WBC 7.2 4.0 - 10.5 K/uL   RBC 4.76 3.87 - 5.11 MIL/uL   Hemoglobin 13.6 12.0 - 15.0 g/dL   HCT 43.5 36.0 - 46.0 %   MCV 91.4 78.0 - 100.0 fL   MCH 28.6 26.0 - 34.0 pg   MCHC 31.3 30.0 - 36.0 g/dL   RDW 13.3 11.5 - 15.5 %   Platelets 213 150 - 400 K/uL  PT- INR at PAT visit (Pre-admission Testing)   Collection Time: 10/13/15 10:30 AM  Result Value Ref Range   Prothrombin Time 13.3 11.6 - 15.2 seconds   INR 0.99 0.00 - 1.49  Results for orders placed or performed in visit on 04/08/15 (from the past 8736 hour(s))  CBC with Differential/Platelet   Collection Time: 04/08/15  8:40 AM  Result Value Ref Range   WBC 4.8 4.0 - 10.5 K/uL   RBC 4.46 3.87 - 5.11 MIL/uL   Hemoglobin 12.5 12.0 - 15.0 g/dL   HCT 37.6 36.0 - 46.0 %   MCV 84.3 78.0 - 100.0 fL   MCH 28.0 26.0 - 34.0 pg   MCHC 33.2 30.0 - 36.0 g/dL   RDW 13.6 11.5 - 15.5 %   Platelets 220 150 - 400 K/uL   MPV 9.8 8.6 - 12.4 fL   Neutrophils Relative % 46 43 - 77 %   Neutro Abs 2.2 1.7 - 7.7 K/uL   Lymphocytes Relative 41 12 - 46 %   Lymphs Abs 2.0 0.7 - 4.0 K/uL   Monocytes Relative 9 3 - 12 %   Monocytes Absolute 0.4 0.1 - 1.0 K/uL   Eosinophils Relative 3 0 - 5 %   Eosinophils Absolute 0.1 0.0 - 0.7 K/uL   Basophils Relative 1 0 - 1 %   Basophils Absolute 0.0 0.0 - 0.1 K/uL   Smear Review Criteria for review not met  Hepatic function panel   Collection Time: 04/08/15  8:40 AM  Result Value Ref Range   Total Bilirubin 0.3 0.2 - 1.2 mg/dL   Bilirubin, Direct 0.1 <=0.2 mg/dL   Indirect Bilirubin 0.2 0.2 - 1.2 mg/dL   Alkaline Phosphatase 48 33 - 130 U/L   AST 19 10 - 35 U/L   ALT 13 6 - 29 U/L   Total Protein 6.8 6.1 - 8.1 g/dL   Albumin 4.0 3.6 - 5.1 g/dL  Hepatitis C RNA quantitative   Collection Time: 04/08/15  8:40 AM  Result Value Ref Range   HCV Quantitative Not Detected <15 IU/mL   HCV Quantitative Log NOT CALC <1.18 log 10  AFP tumor marker   Collection Time: 04/08/15  9:22 AM  Result  Value Ref Range   AFP-Tumor Marker 3.4 <6.1 ng/mL  Results for orders placed or performed in visit on 02/19/15 (from the past 8736 hour(s))  Hepatic function panel   Collection Time: 02/19/15 12:57 PM  Result Value Ref Range   Total Bilirubin 0.5 0.2 - 1.2 mg/dL   Bilirubin, Direct 0.1 0.0 - 0.3 mg/dL   Indirect Bilirubin 0.4 0.2 - 1.2 mg/dL   Alkaline Phosphatase 60 39 - 117 U/L   AST 21 0 - 37 U/L   ALT 14 0 - 35 U/L   Total Protein 7.5 6.0 - 8.3 g/dL   Albumin 4.2 3.5 - 5.2 g/dL  Results for orders placed or performed in visit on 01/28/15 (from the past 8736 hour(s))  CBC with Differential/Platelet   Collection Time: 02/19/15 12:55 PM  Result Value Ref Range   WBC 6.5 4.0 - 10.5 K/uL   RBC 4.94 3.87 - 5.11 MIL/uL   Hemoglobin 14.0 12.0 - 15.0 g/dL   HCT 42.1 36.0 - 46.0 %   MCV 85.2 78.0 - 100.0 fL   MCH 28.3 26.0 - 34.0 pg   MCHC 33.3 30.0 - 36.0 g/dL   RDW 13.6 11.5 - 15.5 %   Platelets 241 150 - 400 K/uL   MPV 10.3 8.6 - 12.4 fL   Neutrophils Relative % 54 43 - 77 %   Neutro Abs 3.5 1.7 - 7.7 K/uL   Lymphocytes Relative 34 12 - 46 %   Lymphs Abs 2.2 0.7 - 4.0 K/uL   Monocytes Relative 7 3 - 12 %   Monocytes Absolute 0.5 0.1 - 1.0 K/uL   Eosinophils Relative 4 0 - 5 %   Eosinophils Absolute 0.3 0.0 - 0.7 K/uL   Basophils Relative 1 0 - 1 %   Basophils Absolute 0.1 0.0 - 0.1 K/uL   Smear Review Criteria for review not met   Hepatitis C RNA quantitative   Collection Time: 02/19/15 12:59 PM  Result Value Ref Range   HCV Quantitative Not Detected <15 IU/mL   HCV Quantitative Log NOT CALC <1.18 log 10  Family doctor is following her labs  Assessment Axis I Maj. depressive disorder, insomnia, pain issues, HIstory of alcoholism. Axis II deferred Axis III see medical history Axis IV mild to moderate  Plan: She will continue Lamictal 50 mg twice a day for mood stabilization. She'll continue Effexor XR 150 mg daily for depression. She will also continue Ativan1 mg 2  times a day for anxiety. She is no longer using trazodone She'll return in 3 months  MEDICATIONS this encounter: Meds ordered this encounter  Medications  . lamoTRIgine (LAMICTAL) 25 MG tablet    Sig: Take 2 tablets (50 mg total)  by mouth 2 (two) times daily.    Dispense:  120 tablet    Refill:  2  . venlafaxine XR (EFFEXOR XR) 150 MG 24 hr capsule    Sig: Take 1 capsule (150 mg total) by mouth daily with breakfast.    Dispense:  30 capsule    Refill:  2  . DISCONTD: LORazepam (ATIVAN) 1 MG tablet    Sig: Take 1 tablet (1 mg total) by mouth 2 (two) times daily.    Dispense:  60 tablet    Refill:  2  . LORazepam (ATIVAN) 1 MG tablet    Sig: Take 1 tablet (1 mg total) by mouth 3 (three) times daily.    Dispense:  90 tablet    Refill:  2    Medical Decision Making Problem Points:  Established problem, worsening (2), Review of last therapy session (1) and Review of psycho-social stressors (1) Data Points:  Review or order clinical lab tests (1) Review of medication regiment & side effects (2) Review of new medications or change in dosage (2)  I certify that outpatient services furnished can reasonably be expected to improve the patient's condition.   Levonne Spiller, MD

## 2016-02-18 ENCOUNTER — Ambulatory Visit (INDEPENDENT_AMBULATORY_CARE_PROVIDER_SITE_OTHER): Payer: 59 | Admitting: Psychology

## 2016-02-18 DIAGNOSIS — F419 Anxiety disorder, unspecified: Secondary | ICD-10-CM | POA: Diagnosis not present

## 2016-02-18 DIAGNOSIS — F332 Major depressive disorder, recurrent severe without psychotic features: Secondary | ICD-10-CM

## 2016-03-10 ENCOUNTER — Ambulatory Visit (HOSPITAL_COMMUNITY): Payer: Self-pay | Admitting: Psychology

## 2016-03-16 ENCOUNTER — Encounter (HOSPITAL_COMMUNITY): Payer: Self-pay | Admitting: Psychology

## 2016-03-16 ENCOUNTER — Ambulatory Visit (INDEPENDENT_AMBULATORY_CARE_PROVIDER_SITE_OTHER): Payer: 59 | Admitting: Psychology

## 2016-03-16 DIAGNOSIS — F419 Anxiety disorder, unspecified: Secondary | ICD-10-CM

## 2016-03-16 DIAGNOSIS — F332 Major depressive disorder, recurrent severe without psychotic features: Secondary | ICD-10-CM | POA: Diagnosis not present

## 2016-03-16 NOTE — Progress Notes (Signed)
PROGRESS NOTE   Patient:  Lauren [name]   DOB: 05-26-1951  MR Number: [id number]  Location: [facility] [address] [place] [address] Dept: [phone]  Start: 10 AM End: 11 AM  Provider/Observer:     Edgardo Roys PSYD  Chief Complaint:      Chief Complaint  Patient presents with  . Depression  . Agitation  . Anxiety  . Stress  . Trauma    Reason For Service:     The patient has a long history of major depressive condition and ongoing underlying depressive disorder. She is a recovering alcoholic but has many many years this Friday. Significant symptoms of anxiety have played a major role in substance abuse in the past. Significant medical problems and her inability to work have complicated her situation. When all these difficulties are put in the context and relationship to potential workability it is clear that she is disabled not do not expect her to return to gainful employment.  Interventions Strategy:  Cognitive/behavioral psychotherapy  Participation Level:   Active  Participation Quality:  Appropriate      Behavioral Observation:  Well Groomed, Alert, and Appropriate.   Current Psychosocial Factors: The patient reports that While the stressors with her husband and the gentleman that is also living in the house continue to be there the patient has been doing a little bit better as far as coping and managing. The patient reports that she is doing better as far as depression recently..  Content of Session:   Review current symptoms and continue to work on therapeutic interventions for improved coping skills and strategies.  Current Status:   The patient reports that she feels like she is doing well relative to the situation that is going on around her. Her husband has a fracture in his hip there is nondisplaced but has significant limitations. There is also  gentleman living with them who is working on his sobriety from opiates and while he is doing well with that is causing a lot of stress and increased financial strain. The patient reports that given the circumstances she feels like she is actually doing well relatively speaking but is causing a lot of stress.  Patient Progress:   Stable and improving  Target Goals:   Reduce the frequency of depressive events including feelings of hopelessness and helplessness, anhedonia, social withdrawal, as well as significant anxiety symptoms related to social avoidance, fear of abandonment and fear of inability to manage and cope with her world. We will also look at continuing complete sobriety.  Last Reviewed:   03/16/2016  Goals Addressed Today:    Target goals addressed today have to do with issues of depression in particular feelings of helplessness and hopelessness.   Impression/Diagnosis:   The patient has a long history of depressive disorder and underlying anxiety disorder. She has had numerous medical issues including severe fibromyalgia and severe arthritis. She has been diagnosed with significant arthritis throughout her body and in particular her hands and her feet. Numerous events and left are overwhelmed and unable to work. The patient has had periodic times of improvement for primary symptoms but there are other times where the symptoms are so problematic that she has not been able to maintain gainful employment.  Diagnosis:    Axis I:  Major depressive disorder, recurrent, severe without psychotic features ([place])  Anxiety    RODENBOUGH,JOHN R, PsyD 03/16/2016

## 2016-03-23 ENCOUNTER — Encounter (HOSPITAL_COMMUNITY): Payer: Self-pay | Admitting: Psychology

## 2016-03-23 NOTE — Progress Notes (Signed)
       PROGRESS NOTE   Patient:  Lauren [name]   DOB: [birth year]  MR Number: [id number]  Location: [facility] [address] [place] [address] Dept: [phone]  Start: 2 PM End: 3 PM  Provider/Observer:     Edgardo Roys PSYD  Chief Complaint:      Chief Complaint  Patient presents with  . Anxiety  . Depression    Reason For Service:     The patient has a long history of major depressive condition and ongoing underlying depressive disorder. She is a recovering alcoholic but has many many years this Friday. Significant symptoms of anxiety have played a major role in substance abuse in the past. Significant medical problems and her inability to work have complicated her situation. When all these difficulties are put in the context and relationship to potential workability it is clear that she is disabled not do not expect her to return to gainful employment.  Interventions Strategy:  Cognitive/behavioral psychotherapy  Participation Level:   Active  Participation Quality:  Appropriate      Behavioral Observation:  Well Groomed, Alert, and Appropriate.   Current Psychosocial Factors: The patient reports that there is an increased level of stress and frustration with her husband's lack of following doctors recommendations regarding his recovery from a hip fracture and the gentleman living in their house..  Content of Session:   Review current symptoms and continue to work on therapeutic interventions for improved coping skills and strategies.  Current Status:   The patient reports that she feels like she is doing well relative to the situation that is going on around her. Her husband has a fracture in his hip there is nondisplaced but has significant limitations. There is also gentleman living with them who is working on his sobriety from opiates and while he is doing well with that  is causing a lot of stress and increased financial strain. The patient reports that given the circumstances she feels like she is actually doing well relatively speaking but is causing a lot of stress.  Patient Progress:   Stable and improving  Target Goals:   Reduce the frequency of depressive events including feelings of hopelessness and helplessness, anhedonia, social withdrawal, as well as significant anxiety symptoms related to social avoidance, fear of abandonment and fear of inability to manage and cope with her world. We will also look at continuing complete sobriety.  Last Reviewed:   12/16/2015  Goals Addressed Today:    Target goals addressed today have to do with issues of depression in particular feelings of helplessness and hopelessness.   Impression/Diagnosis:   The patient has a long history of depressive disorder and underlying anxiety disorder. She has had numerous medical issues including severe fibromyalgia and severe arthritis. She has been diagnosed with significant arthritis throughout her body and in particular her hands and her feet. Numerous events and left are overwhelmed and unable to work. The patient has had periodic times of improvement for primary symptoms but there are other times where the symptoms are so problematic that she has not been able to maintain gainful employment.  Diagnosis:    Axis I:  Major depressive disorder, recurrent, severe without psychotic features ([place])  Anxiety    RODENBOUGH,JOHN R, PsyD 03/23/2016

## 2016-04-04 ENCOUNTER — Other Ambulatory Visit (INDEPENDENT_AMBULATORY_CARE_PROVIDER_SITE_OTHER): Payer: Self-pay | Admitting: Internal Medicine

## 2016-04-12 ENCOUNTER — Ambulatory Visit (HOSPITAL_COMMUNITY): Payer: 59 | Admitting: Psychology

## 2016-04-18 ENCOUNTER — Ambulatory Visit (HOSPITAL_COMMUNITY): Payer: Self-pay | Admitting: Psychiatry

## 2016-04-18 ENCOUNTER — Encounter (HOSPITAL_COMMUNITY): Payer: Self-pay

## 2016-04-18 ENCOUNTER — Telehealth (HOSPITAL_COMMUNITY): Payer: Self-pay | Admitting: *Deleted

## 2016-04-18 NOTE — Telephone Encounter (Signed)
left message with Edd Arbour, returned phone call to patient.

## 2016-04-19 ENCOUNTER — Encounter (HOSPITAL_COMMUNITY): Payer: Self-pay | Admitting: Psychiatry

## 2016-04-28 ENCOUNTER — Ambulatory Visit (HOSPITAL_COMMUNITY): Payer: Self-pay | Admitting: Psychology

## 2016-04-28 ENCOUNTER — Encounter (HOSPITAL_COMMUNITY): Payer: Self-pay | Admitting: Psychology

## 2016-05-04 ENCOUNTER — Ambulatory Visit (INDEPENDENT_AMBULATORY_CARE_PROVIDER_SITE_OTHER): Payer: 59 | Admitting: Psychology

## 2016-05-04 ENCOUNTER — Encounter (HOSPITAL_COMMUNITY): Payer: Self-pay | Admitting: Psychology

## 2016-05-04 DIAGNOSIS — F332 Major depressive disorder, recurrent severe without psychotic features: Secondary | ICD-10-CM | POA: Diagnosis not present

## 2016-05-04 DIAGNOSIS — F419 Anxiety disorder, unspecified: Secondary | ICD-10-CM

## 2016-05-04 NOTE — Progress Notes (Signed)
PROGRESS NOTE   Patient:  Lauren [name]   DOB: [birth year]  MR Number: [id number]  Location: [facility] [address] [place] [address] Dept: [phone]  Start: 10 AM End: 11 AM  Provider/Observer:     Edgardo Roys PSYD  Chief Complaint:      Chief Complaint  Patient presents with  . Anxiety  . Depression  . Stress    Reason For Service:     The patient has a long history of major depressive condition and ongoing underlying depressive disorder. She is a recovering alcoholic but has many many years this Friday. Significant symptoms of anxiety have played a major role in substance abuse in the past. Significant medical problems and her inability to work have complicated her situation. When all these difficulties are put in the context and relationship to potential workability it is clear that she is disabled not do not expect her to return to gainful employment.  Interventions Strategy:  Cognitive/behavioral psychotherapy  Participation Level:   Active  Participation Quality:  Appropriate      Behavioral Observation:  Well Groomed, Alert, and Appropriate.   Current Psychosocial Factors: The patient reports that While the stressors with her husband and the gentleman that is also living in the house continue to be there the patient has been doing a little bit better as far as coping and managing. The patient reports that she is doing better as far as depression recently..  Content of Session:   Review current symptoms and continue to work on therapeutic interventions for improved coping skills and strategies.  Current Status:   The patient reports that she feels like she is doing well relative to the situation that is going on around her. Her husband has a fracture in his hip there is nondisplaced but has significant limitations. There is also gentleman living with them who is  working on his sobriety from opiates and while he is doing well with that is causing a lot of stress and increased financial strain. The patient reports that given the circumstances she feels like she is actually doing well relatively speaking but is causing a lot of stress.  Patient Progress:   Stable and improving  Target Goals:   Reduce the frequency of depressive events including feelings of hopelessness and helplessness, anhedonia, social withdrawal, as well as significant anxiety symptoms related to social avoidance, fear of abandonment and fear of inability to manage and cope with her world. We will also look at continuing complete sobriety.  Last Reviewed:   05/04/2016  Goals Addressed Today:    Target goals addressed today have to do with issues of depression in particular feelings of helplessness and hopelessness.   Impression/Diagnosis:   The patient has a long history of depressive disorder and underlying anxiety disorder. She has had numerous medical issues including severe fibromyalgia and severe arthritis. She has been diagnosed with significant arthritis throughout her body and in particular her hands and her feet. Numerous events and left are overwhelmed and unable to work. The patient has had periodic times of improvement for primary symptoms but there are other times where the symptoms are so problematic that she has not been able to maintain gainful employment.  Diagnosis:    Axis I:  1. Major depressive disorder, recurrent, severe without psychotic features ([place])    2. Anxiety        Mishawn Didion R, PsyD 05/04/2016

## 2016-05-25 ENCOUNTER — Encounter (HOSPITAL_COMMUNITY): Payer: Self-pay | Admitting: Psychiatry

## 2016-05-25 ENCOUNTER — Ambulatory Visit (INDEPENDENT_AMBULATORY_CARE_PROVIDER_SITE_OTHER): Payer: 59 | Admitting: Psychiatry

## 2016-05-25 VITALS — BP 125/78 | HR 88 | Ht 66.0 in | Wt 143.4 lb

## 2016-05-25 DIAGNOSIS — F332 Major depressive disorder, recurrent severe without psychotic features: Secondary | ICD-10-CM | POA: Diagnosis not present

## 2016-05-25 MED ORDER — TEMAZEPAM 15 MG PO CAPS
15.0000 mg | ORAL_CAPSULE | Freq: Every evening | ORAL | 2 refills | Status: DC | PRN
Start: 2016-05-25 — End: 2016-07-21

## 2016-05-25 MED ORDER — LAMOTRIGINE 25 MG PO TABS: 50.0000 mg | ORAL_TABLET | Freq: Two times a day (BID) | ORAL | 2 refills | Status: DC

## 2016-05-25 MED ORDER — LORAZEPAM 1 MG PO TABS: 1.0000 mg | ORAL_TABLET | Freq: Three times a day (TID) | ORAL | 2 refills | Status: DC

## 2016-05-25 MED ORDER — VENLAFAXINE HCL ER 150 MG PO CP24: 150.0000 mg | ORAL_CAPSULE | Freq: Every day | ORAL | 2 refills | Status: DC
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
## 2016-05-27 ENCOUNTER — Ambulatory Visit (HOSPITAL_COMMUNITY): Payer: Self-pay | Admitting: Psychology

## 2016-06-01 ENCOUNTER — Ambulatory Visit (HOSPITAL_COMMUNITY): Payer: Self-pay | Admitting: Psychology

## 2016-06-20 ENCOUNTER — Telehealth (HOSPITAL_COMMUNITY): Payer: Self-pay | Admitting: *Deleted

## 2016-06-20 ENCOUNTER — Ambulatory Visit (INDEPENDENT_AMBULATORY_CARE_PROVIDER_SITE_OTHER): Payer: 59 | Admitting: Psychology

## 2016-06-20 ENCOUNTER — Encounter (HOSPITAL_COMMUNITY): Payer: Self-pay | Admitting: Psychology

## 2016-06-20 DIAGNOSIS — F332 Major depressive disorder, recurrent severe without psychotic features: Secondary | ICD-10-CM

## 2016-06-20 DIAGNOSIS — F419 Anxiety disorder, unspecified: Secondary | ICD-10-CM

## 2016-06-20 NOTE — Telephone Encounter (Signed)
returned phone call regarding appointment.  No answer, no option to leave VM.

## 2016-06-20 NOTE — Progress Notes (Signed)
      PROGRESS NOTE   Patient:  Lauren [name]   DOB: 07/28/1951  MR Number: [id number]  Location: [facility] [address] [place] [address] Dept: [phone]  Start: 9 AM End: 10 AM  Provider/Observer:     Edgardo Roys PSYD  Chief Complaint:      Chief Complaint  Patient presents with  . Agitation  . Anxiety  . Depression  . Stress  . Trauma    Reason For Service:     The patient has a long history of major depressive condition and ongoing underlying depressive disorder. She is a recovering alcoholic but has many many years this Friday. Significant symptoms of anxiety have played a major role in substance abuse in the past. Significant medical problems and her inability to work have complicated her situation. When all these difficulties are put in the context and relationship to potential workability it is clear that she is disabled not do not expect her to return to gainful employment.  Interventions Strategy:  Cognitive/behavioral psychotherapy  Participation Level:   Active  Participation Quality:  Appropriate      Behavioral Observation:  Well Groomed, Alert, and Appropriate.   Current Psychosocial Factors: The patient reports that there are still a lot of stressors with the person living at their home.  The patient reprots that this creates issues with her husband.  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  Content of Session:   Review current symptoms and continue to work on therapeutic interventions for improved coping skills and strategies.  Current Status:   The patient reports that she feels like she is doing well relative to the situation that is going on around her.Her husband is still not really taking care of himself and that husband and let someone live in their house for 1 year now that is not paying rent and eats food etc.     Patient Progress:   Stable and improving  Target Goals:   Reduce the frequency of depressive events including feelings of hopelessness and helplessness, anhedonia, social withdrawal, as well as significant anxiety symptoms related to social avoidance, fear of abandonment and fear of inability to manage and cope with her world. We will also look at continuing complete sobriety.  Last Reviewed:   06/20/2016  Goals Addressed Today:    Target goals addressed today have to do with issues of depression in particular feelings of helplessness and hopelessness.   Impression/Diagnosis:   The patient has a long history of depressive disorder and underlying anxiety disorder. She has had numerous medical issues including severe fibromyalgia and severe arthritis. She has been diagnosed with significant arthritis throughout her body and in particular her hands and her feet. Numerous events and left are overwhelmed and unable to work. The patient has had periodic times of improvement for primary symptoms but there are other times where the symptoms are so problematic that she has not been able to maintain gainful employment.  Diagnosis:    Axis I:  1. Major depressive disorder, recurrent, severe without psychotic features ([place])    2. Anxiety        RODENBOUGH,JOHN R, PsyD 06/20/2016

## 2016-07-06 ENCOUNTER — Ambulatory Visit (INDEPENDENT_AMBULATORY_CARE_PROVIDER_SITE_OTHER): Payer: Medicare Other | Admitting: Internal Medicine

## 2016-07-06 ENCOUNTER — Encounter (INDEPENDENT_AMBULATORY_CARE_PROVIDER_SITE_OTHER): Payer: Self-pay | Admitting: *Deleted

## 2016-07-06 ENCOUNTER — Encounter (INDEPENDENT_AMBULATORY_CARE_PROVIDER_SITE_OTHER): Payer: Self-pay | Admitting: Internal Medicine

## 2016-07-06 VITALS — BP 132/80 | HR 72 | Temp 98.6°F | Ht 67.0 in | Wt 147.6 lb

## 2016-07-06 DIAGNOSIS — K580 Irritable bowel syndrome with diarrhea: Secondary | ICD-10-CM | POA: Diagnosis not present

## 2016-07-06 DIAGNOSIS — B1920 Unspecified viral hepatitis C without hepatic coma: Secondary | ICD-10-CM | POA: Diagnosis not present

## 2016-07-06 LAB — CBC WITH DIFFERENTIAL/PLATELET
Basophils Absolute: 51 cells/uL (ref 0–200)
Basophils Relative: 1 %
EOS PCT: 6 %
Eosinophils Absolute: 306 cells/uL (ref 15–500)
HCT: 39.4 % (ref 35.0–45.0)
Hemoglobin: 12.8 g/dL (ref 11.7–15.5)
LYMPHS ABS: 1989 {cells}/uL (ref 850–3900)
LYMPHS PCT: 39 %
MCH: 28.3 pg (ref 27.0–33.0)
MCHC: 32.5 g/dL (ref 32.0–36.0)
MCV: 87 fL (ref 80.0–100.0)
MPV: 10.3 fL (ref 7.5–12.5)
Monocytes Absolute: 459 cells/uL (ref 200–950)
Monocytes Relative: 9 %
NEUTROS PCT: 45 %
Neutro Abs: 2295 cells/uL (ref 1500–7800)
Platelets: 212 10*3/uL (ref 140–400)
RBC: 4.53 MIL/uL (ref 3.80–5.10)
RDW: 13.1 % (ref 11.0–15.0)
WBC: 5.1 10*3/uL (ref 3.8–10.8)

## 2016-07-06 MED ORDER — DICYCLOMINE HCL 20 MG PO TABS: 20.0000 mg | ORAL_TABLET | Freq: Four times a day (QID) | ORAL | 2 refills | Status: DC | PRN

## 2016-07-06 MED ORDER — DIPHENOXYLATE-ATROPINE 2.5-0.025 MG PO TABS: 1.0000 | ORAL_TABLET | Freq: Four times a day (QID) | ORAL | 0 refills | Status: DC | PRN

## 2016-07-06 NOTE — Progress Notes (Signed)
Subjective:    Patient ID: Lauren [name], female    DOB: [birth year], 65 y.o.   MRN: [id number]  HPI Here today for f/u. She was last seen I August of 2016.  HPI  Here today for f/u of her Hepatitis C. She is genotype 1A. Elastrogrpahy F2 and F3.    Liver biopsy March 2012: Grade 1, Stage 0.  She started tx with Harvoni on 12/12/2014 x 12 weeks. She was successfully treated, She is doing good. No complaints. Appetite is good. No weight loss. She She has been taking care of her husband who has a broken hip and pelvis. No rashes She has a BM every 1-3 a day.  No melena or BRRB.    04/08/2015 Hep C quaint undetected CBC    Component Value Date/Time   WBC 7.2 10/13/2015 1030   RBC 4.76 10/13/2015 1030   HGB 13.6 10/13/2015 1030   HCT 43.5 10/13/2015 1030   PLT 213 10/13/2015 1030   MCV 91.4 10/13/2015 1030   MCH 28.6 10/13/2015 1030   MCHC 31.3 10/13/2015 1030   RDW 13.3 10/13/2015 1030   LYMPHSABS 2.0 04/08/2015 0840   MONOABS 0.4 04/08/2015 0840   EOSABS 0.1 04/08/2015 0840   BASOSABS 0.0 04/08/2015 0840   Hepatic Function Latest Ref Rng & Units 10/13/2015 04/08/2015 02/19/2015  Total Protein 6.5 - 8.1 g/dL 7.8 6.8 7.5  Albumin 3.5 - 5.0 g/dL 4.4 4.0 4.2  AST 15 - 41 U/L _0 ALT 14 - 54 U/L _1 Alk Phosphatase 38 - 126 U/L 51 48 60  Total Bilirubin 0.3 - 1.2 mg/dL 0.4 0.3 0.5  Bilirubin, Direct <=0.2 mg/dL - 0.1 0.1       Review of Systems Past Medical History:  Diagnosis Date  . Anxiety   . Chronic pain   . COPD (chronic obstructive pulmonary disease) ([place])   . Depression   . Fibromyalgia   . Fibromyalgia   . GERD (gastroesophageal reflux disease)   . Hepatitis C   . History of blood transfusion   . History of bronchitis   . IBS (irritable bowel syndrome)   . Osteoarthritis   . Pneumonia   . Prediabetes   . Shortness of breath dyspnea    allergies; increased pain;   . Vertigo   . Wears glasses     Past Surgical History:  Procedure  Laterality Date  . CHOLECYSTECTOMY    . COLONOSCOPY    . COLONOSCOPY N/A 10/23/2014   Procedure: COLONOSCOPY;  Surgeon: Rogene Houston, MD;  [facility];  Service: Endoscopy;  Laterality: N/A;  1030  . ESOPHAGOGASTRODUODENOSCOPY N/A 10/23/2014   Procedure: ESOPHAGOGASTRODUODENOSCOPY (EGD);  Surgeon: Rogene Houston, MD;  [facility];  Service: Endoscopy;  Laterality: N/A;  . KNEE ARTHROSCOPY Left 10/21/2015   Procedure: ARTHROSCOPY LEFT KNEE WITH MENICAL DEBRIDEMENT, Chondroplasty;  Surgeon: Gaynelle Arabian, MD;  [facility];  Service: Orthopedics;  Laterality: Left;  Marland Kitchen MANDIBLE SURGERY  09/06/1979  . ORIF WRIST FRACTURE Right 07/23/2013   Procedure: OPEN REDUCTION INTERNAL FIXATION (ORIF) WRIST FRACTURE;  Surgeon: Roseanne Kaufman, MD;  Location: [facility];  Service: Orthopedics;  Laterality: Right;  . TONSILLECTOMY    . TOTAL KNEE ARTHROPLASTY     2011 rt knee  . UPPER GASTROINTESTINAL ENDOSCOPY      Allergies  Allergen Reactions  . Elavil [Amitriptyline] Other (See Comments)    Vivid dreams and almost suicidal  . Flagyl [Metronidazole]  Itching  . Vistaril [Hydroxyzine Hcl] Other (See Comments)    Got higher than a kite on too much of this.  Lindajo Royal [Ziprasidone Hydrochloride] Other (See Comments)    Blacked out  . Lactose Intolerance (Gi) Other (See Comments)    GI upset  . Latex Other (See Comments)    Red at site    Current Outpatient Prescriptions on File Prior to Visit  Medication Sig Dispense Refill  . albuterol (PROAIR HFA) 108 (90 BASE) MCG/ACT inhaler Inhale 2 puffs into the lungs every 4 (four) hours as needed for wheezing or shortness of breath.    Marland Kitchen aspirin EC 81 MG tablet Take 81 mg by mouth daily.    . calcium-vitamin D (OSCAL WITH D) 500-200 MG-UNIT per tablet Take 1 tablet by mouth 2 (two) times daily. For low calcium (Patient taking differently: Take 1 tablet by mouth 2 (two) times daily as needed. For low calcium)    . diclofenac sodium  (VOLTAREN) 1 % GEL Apply 2 g topically 4 (four) times daily as needed (For pain.).     Marland Kitchen dicyclomine (BENTYL) 20 MG tablet Take 20 mg by mouth every 6 (six) hours as needed for spasms.     . diphenoxylate-atropine (LOMOTIL) 2.5-0.025 MG per tablet Take 1 tablet by mouth 4 (four) times daily as needed for diarrhea or loose stools. 30 tablet 0  . lamoTRIgine (LAMICTAL) 25 MG tablet Take 2 tablets (50 mg total) by mouth 2 (two) times daily. 120 tablet 2  . LORazepam (ATIVAN) 1 MG tablet Take 1 tablet (1 mg total) by mouth 3 (three) times daily. 90 tablet 2  . oxyCODONE (ROXICODONE) 5 MG immediate release tablet Take 1-2 tablets (5-10 mg total) by mouth every 4 (four) hours as needed for severe pain. 50 tablet 0  . pravastatin (PRAVACHOL) 80 MG tablet Take 80 mg by mouth daily.    . ranitidine (ZANTAC) 150 MG tablet TAKE 1 TABLET (150 MG TOTAL) BY MOUTH 2 (TWO) TIMES DAILY. 60 tablet 5  . temazepam (RESTORIL) 15 MG capsule Take 1 capsule (15 mg total) by mouth at bedtime as needed for sleep. 30 capsule 2  . venlafaxine XR (EFFEXOR XR) 150 MG 24 hr capsule Take 1 capsule (150 mg total) by mouth daily with breakfast. 30 capsule 2   No current facility-administered medications on file prior to visit.        Objective:   Physical Exam Blood pressure 132/80, pulse 72, temperature 98.6 F (37 C), height _0  (1.702 m), weight 147 lb 9.6 oz (67 kg). Alert and oriented. Skin warm and dry. Oral mucosa is moist.   . Sclera anicteric, conjunctivae is pink. Thyroid not enlarged. No cervical lymphadenopathy. Lungs clear. Heart regular rate and rhythm.  Abdomen is soft. Bowel sounds are positive. No hepatomegaly. No abdominal masses felt. No tenderness.  No edema to lower extremities.         Assessment & Plan:  Hepatitis C. She was successfully treated in 2016 with Harvoni. Will get a CBC, Hepatic and [place] RUQ today. OV in 1 year. Hep C quaint. IBS: Will refill Lomotil and Dicyclomine

## 2016-07-06 NOTE — Patient Instructions (Signed)
OV in 1 year.  

## 2016-07-06 NOTE — Progress Notes (Signed)
      PROGRESS NOTE   Patient:  Lauren [name]   DOB: 10/03/1950  MR Number: [id number]  Location: [facility] [address] [place] [address] Dept: [phone]  Start: 9 AM End: 10 AM  Provider/Observer:     Edgardo Roys PSYD  Chief Complaint:      Chief Complaint  Patient presents with  . Anxiety  . Depression  . Stress  . Trauma    Reason For Service:     The patient has a long history of major depressive condition and ongoing underlying depressive disorder. She is a recovering alcoholic but has many many years this Friday. Significant symptoms of anxiety have played a major role in substance abuse in the past. Significant medical problems and her inability to work have complicated her situation. When all these difficulties are put in the context and relationship to potential workability it is clear that she is disabled not do not expect her to return to gainful employment.  Interventions Strategy:  Cognitive/behavioral psychotherapy  Participation Level:   Active  Participation Quality:  Appropriate      Behavioral Observation:  Well Groomed, Alert, and Appropriate.   Current Psychosocial Factors: The patient reports that there is an increased level of stress and frustration with her husband's lack of following doctors recommendations regarding his recovery from a hip fracture and the gentleman living in their house..  Content of Session:   Review current symptoms and continue to work on therapeutic interventions for improved coping skills and strategies.  Current Status:   The patient reports that she feels like she is doing well relative to the situation that is going on around her. Her husband has a fracture in his hip there is nondisplaced but has significant limitations. There is also gentleman living with them who is working on his sobriety from opiates and while he is  doing well with that is causing a lot of stress and increased financial strain. The patient reports that given the circumstances she feels like she is actually doing well relatively speaking but is causing a lot of stress.  Patient Progress:   Stable and improving  Target Goals:   Reduce the frequency of depressive events including feelings of hopelessness and helplessness, anhedonia, social withdrawal, as well as significant anxiety symptoms related to social avoidance, fear of abandonment and fear of inability to manage and cope with her world. We will also look at continuing complete sobriety.  Last Reviewed:   02/17/2016  Goals Addressed Today:    Target goals addressed today have to do with issues of depression in particular feelings of helplessness and hopelessness.   Impression/Diagnosis:   The patient has a long history of depressive disorder and underlying anxiety disorder. She has had numerous medical issues including severe fibromyalgia and severe arthritis. She has been diagnosed with significant arthritis throughout her body and in particular her hands and her feet. Numerous events and left are overwhelmed and unable to work. The patient has had periodic times of improvement for primary symptoms but there are other times where the symptoms are so problematic that she has not been able to maintain gainful employment.  Diagnosis:    Axis I:  1. Major depressive disorder, recurrent, severe without psychotic features ([place])    2. Anxiety        RODENBOUGH,JOHN R, PsyD 07/06/2016

## 2016-07-07 ENCOUNTER — Other Ambulatory Visit (INDEPENDENT_AMBULATORY_CARE_PROVIDER_SITE_OTHER): Payer: Self-pay | Admitting: *Deleted

## 2016-07-07 ENCOUNTER — Telehealth (INDEPENDENT_AMBULATORY_CARE_PROVIDER_SITE_OTHER): Payer: Self-pay | Admitting: Internal Medicine

## 2016-07-07 DIAGNOSIS — R197 Diarrhea, unspecified: Secondary | ICD-10-CM

## 2016-07-07 LAB — HEPATIC FUNCTION PANEL
ALT: 16 U/L (ref 6–29)
AST: 20 U/L (ref 10–35)
Albumin: 4.2 g/dL (ref 3.6–5.1)
Alkaline Phosphatase: 48 U/L (ref 33–130)
BILIRUBIN DIRECT: 0.1 mg/dL (ref <=0.2)
BILIRUBIN TOTAL: 0.4 mg/dL (ref 0.2–1.2)
Indirect Bilirubin: 0.3 mg/dL (ref 0.2–1.2)
Total Protein: 7.1 g/dL (ref 6.1–8.1)

## 2016-07-07 LAB — HEPATITIS C RNA QUANTITATIVE: HCV Quantitative: NOT DETECTED IU/mL (ref <15)

## 2016-07-07 MED ORDER — DIPHENOXYLATE-ATROPINE 2.5-0.025 MG PO TABS: 1.0000 | ORAL_TABLET | Freq: Four times a day (QID) | ORAL | 0 refills | Status: DC | PRN

## 2016-07-07 NOTE — Telephone Encounter (Signed)
Rx for Lomotil ordered. Printer will not print RX so I am going to hand write.

## 2016-07-07 NOTE — Addendum Note (Signed)
Addended by: Butch Penny on: 07/07/2016 08:44 AM   Modules accepted: Orders

## 2016-07-11 ENCOUNTER — Telehealth (HOSPITAL_COMMUNITY): Payer: Self-pay | Admitting: *Deleted

## 2016-07-11 NOTE — Telephone Encounter (Signed)
Pt pharmacy [facility] requesting 90 days supply for pt Effexor ER 150 mg QAM. Pt medication was last filled on 05-25-2016 with 30 tabs 2 refills. Pt f/u appt is scheduled for 07-21-2016. Pt pharmacy number is [phone].

## 2016-07-12 ENCOUNTER — Ambulatory Visit (INDEPENDENT_AMBULATORY_CARE_PROVIDER_SITE_OTHER): Payer: 59 | Admitting: Psychology

## 2016-07-12 ENCOUNTER — Other Ambulatory Visit (HOSPITAL_COMMUNITY): Payer: Self-pay | Admitting: Psychiatry

## 2016-07-12 DIAGNOSIS — F419 Anxiety disorder, unspecified: Secondary | ICD-10-CM

## 2016-07-12 DIAGNOSIS — F332 Major depressive disorder, recurrent severe without psychotic features: Secondary | ICD-10-CM | POA: Diagnosis not present

## 2016-07-12 MED ORDER — VENLAFAXINE HCL ER 150 MG PO CP24: 150.0000 mg | ORAL_CAPSULE | Freq: Every day | ORAL | 2 refills | Status: DC

## 2016-07-12 NOTE — Telephone Encounter (Signed)
sent 

## 2016-07-12 NOTE — Telephone Encounter (Signed)
noted 

## 2016-07-13 ENCOUNTER — Ambulatory Visit (HOSPITAL_COMMUNITY)
Admission: RE | Admit: 2016-07-13 | Discharge: 2016-07-13 | Disposition: A | Discharge status: Home / Self Care | Payer: Medicare Other | Source: Ambulatory Visit | Attending: Internal Medicine | Admitting: Internal Medicine

## 2016-07-13 DIAGNOSIS — B1920 Unspecified viral hepatitis C without hepatic coma: Secondary | ICD-10-CM | POA: Diagnosis not present

## 2016-07-13 DIAGNOSIS — K760 Fatty (change of) liver, not elsewhere classified: Secondary | ICD-10-CM | POA: Diagnosis not present

## 2016-07-13 DIAGNOSIS — Z9049 Acquired absence of other specified parts of digestive tract: Secondary | ICD-10-CM | POA: Diagnosis not present

## 2016-07-21 ENCOUNTER — Ambulatory Visit (INDEPENDENT_AMBULATORY_CARE_PROVIDER_SITE_OTHER): Payer: 59 | Admitting: Psychiatry

## 2016-07-21 ENCOUNTER — Encounter (HOSPITAL_COMMUNITY): Payer: Self-pay | Admitting: Psychiatry

## 2016-07-21 VITALS — BP 128/68 | Ht 67.0 in | Wt 147.0 lb

## 2016-07-21 DIAGNOSIS — Z79899 Other long term (current) drug therapy: Secondary | ICD-10-CM

## 2016-07-21 DIAGNOSIS — Z888 Allergy status to other drugs, medicaments and biological substances status: Secondary | ICD-10-CM

## 2016-07-21 DIAGNOSIS — Z91011 Allergy to milk products: Secondary | ICD-10-CM

## 2016-07-21 DIAGNOSIS — Z91040 Latex allergy status: Secondary | ICD-10-CM

## 2016-07-21 DIAGNOSIS — F332 Major depressive disorder, recurrent severe without psychotic features: Secondary | ICD-10-CM | POA: Diagnosis not present

## 2016-07-21 DIAGNOSIS — F419 Anxiety disorder, unspecified: Secondary | ICD-10-CM

## 2016-07-21 DIAGNOSIS — Z9889 Other specified postprocedural states: Secondary | ICD-10-CM

## 2016-07-21 MED ORDER — LAMOTRIGINE 25 MG PO TABS: 50.0000 mg | ORAL_TABLET | Freq: Two times a day (BID) | ORAL | 2 refills | Status: DC

## 2016-07-21 MED ORDER — VENLAFAXINE HCL ER 150 MG PO CP24: 150.0000 mg | ORAL_CAPSULE | Freq: Every day | ORAL | 2 refills | Status: DC

## 2016-07-21 MED ORDER — LORAZEPAM 1 MG PO TABS: 1.0000 mg | ORAL_TABLET | Freq: Three times a day (TID) | ORAL | 2 refills | Status: DC
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
## 2016-07-25 NOTE — Progress Notes (Signed)
      PROGRESS NOTE   Patient:  Lauren [name]   DOB: Mar 09, 1951  MR Number: [id number]  Location: [facility] [address] [place] [address] Dept: [phone]  Start: 9 AM End: 10 AM  Provider/Observer:     Edgardo Roys PSYD  Chief Complaint:      Chief Complaint  Patient presents with  . Agitation  . Depression  . Anxiety    Reason For Service:     The patient has a long history of major depressive condition and ongoing underlying depressive disorder. She is a recovering alcoholic but has many many years this Friday. Significant symptoms of anxiety have played a major role in substance abuse in the past. Significant medical problems and her inability to work have complicated her situation. When all these difficulties are put in the context and relationship to potential workability it is clear that she is disabled not do not expect her to return to gainful employment.  Interventions Strategy:  Cognitive/behavioral psychotherapy  Participation Level:   Active  Participation Quality:  Appropriate      Behavioral Observation:  Well Groomed, Alert, and Appropriate.   Current Psychosocial Factors: The patient reports that there are still a lot of stressors with the person living at their home.  The patient reprots that this creates issues with her husband.  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  Content of Session:   Review current symptoms and continue to work on therapeutic interventions for improved coping skills and strategies.  Current Status:   The patient reports that she feels like she is doing well relative to the situation that is going on around her.Her husband is still not really taking care of himself and that husband and let someone live in their house for 1 year now that is not paying rent and eats food etc.    Patient Progress:   Stable  and improving  Target Goals:   Reduce the frequency of depressive events including feelings of hopelessness and helplessness, anhedonia, social withdrawal, as well as significant anxiety symptoms related to social avoidance, fear of abandonment and fear of inability to manage and cope with her world. We will also look at continuing complete sobriety.  Last Reviewed:   11/80/2017  Goals Addressed Today:    Target goals addressed today have to do with issues of depression in particular feelings of helplessness and hopelessness.   Impression/Diagnosis:   The patient has a long history of depressive disorder and underlying anxiety disorder. She has had numerous medical issues including severe fibromyalgia and severe arthritis. She has been diagnosed with significant arthritis throughout her body and in particular her hands and her feet. Numerous events and left are overwhelmed and unable to work. The patient has had periodic times of improvement for primary symptoms but there are other times where the symptoms are so problematic that she has not been able to maintain gainful employment.  Diagnosis:    Axis I:  1. Major depressive disorder, recurrent, severe without psychotic features ([place])    2. Anxiety        Stephany Poorman R, PsyD 07/25/2016

## 2016-08-02 ENCOUNTER — Ambulatory Visit (HOSPITAL_COMMUNITY)
Admission: RE | Admit: 2016-08-02 | Discharge: 2016-08-02 | Disposition: A | Discharge status: Home / Self Care | Payer: Medicare Other | Source: Ambulatory Visit | Attending: Family Medicine | Admitting: Family Medicine

## 2016-08-02 ENCOUNTER — Ambulatory Visit (HOSPITAL_COMMUNITY): Admitting: Psychology

## 2016-08-02 ENCOUNTER — Other Ambulatory Visit (HOSPITAL_COMMUNITY): Payer: Self-pay | Admitting: Family Medicine

## 2016-08-02 DIAGNOSIS — R062 Wheezing: Secondary | ICD-10-CM | POA: Diagnosis present

## 2016-08-18 ENCOUNTER — Ambulatory Visit (HOSPITAL_COMMUNITY): Admitting: Psychology

## 2016-09-07 ENCOUNTER — Ambulatory Visit (INDEPENDENT_AMBULATORY_CARE_PROVIDER_SITE_OTHER): Payer: 59 | Admitting: Psychology

## 2016-09-07 DIAGNOSIS — F332 Major depressive disorder, recurrent severe without psychotic features: Secondary | ICD-10-CM | POA: Diagnosis not present

## 2016-09-08 NOTE — Progress Notes (Signed)
      PROGRESS NOTE   Patient:  Lauren [name]   DOB: December 23, 1950  MR Number: [id number]  Location: [facility] [address] [place] [address] Dept: [phone]  Start: 9 AM End: 10 AM  Provider/Observer:     Edgardo Roys PSYD  Chief Complaint:      Chief Complaint  Patient presents with  . Anxiety  . Depression  . Agitation  . Stress    Reason For Service:     The patient has a long history of major depressive condition and ongoing underlying depressive disorder. She is a recovering alcoholic but has many many years this Friday. Significant symptoms of anxiety have played a major role in substance abuse in the past. Significant medical problems and her inability to work have complicated her situation. When all these difficulties are put in the context and relationship to potential workability it is clear that she is disabled not do not expect her to return to gainful employment.  Interventions Strategy:  Cognitive/behavioral psychotherapy  Participation Level:   Active  Participation Quality:  Appropriate      Behavioral Observation:  Well Groomed, Alert, and Appropriate.   Current Psychosocial Factors: The patient reports that there are still a lot of stressors with the person living at their home.  The patient reprots that this creates issues with her husband.  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  Content of Session:   Review current symptoms and continue to work on therapeutic interventions for improved coping skills and strategies.  Current Status:   The patient reports that she feels like she is doing well relative to the situation that is going on around her.Her husband is still not really taking care of himself and that husband and let someone live in their house for 1 year now that is not paying rent and eats food etc.    Patient  Progress:   Stable and improving  Target Goals:   Reduce the frequency of depressive events including feelings of hopelessness and helplessness, anhedonia, social withdrawal, as well as significant anxiety symptoms related to social avoidance, fear of abandonment and fear of inability to manage and cope with her world. We will also look at continuing complete sobriety.  Last Reviewed:   09/08/2016  Goals Addressed Today:    Target goals addressed today have to do with issues of depression in particular feelings of helplessness and hopelessness.   Impression/Diagnosis:   The patient has a long history of depressive disorder and underlying anxiety disorder. She has had numerous medical issues including severe fibromyalgia and severe arthritis. She has been diagnosed with significant arthritis throughout her body and in particular her hands and her feet. Numerous events and left are overwhelmed and unable to work. The patient has had periodic times of improvement for primary symptoms but there are other times where the symptoms are so problematic that she has not been able to maintain gainful employment.  Diagnosis:    Axis I:  1. Major depressive disorder, recurrent, severe without psychotic features ([place])        Cartina Brousseau R, PsyD 09/08/2016

## 2016-09-14 ENCOUNTER — Telehealth (HOSPITAL_COMMUNITY): Payer: Self-pay | Admitting: *Deleted

## 2016-09-14 NOTE — Telephone Encounter (Signed)
phone call from patient, said she has a lot of issues at her house and wants to know when Dr. Sima Matas is leaving.

## 2016-09-26 ENCOUNTER — Other Ambulatory Visit (INDEPENDENT_AMBULATORY_CARE_PROVIDER_SITE_OTHER): Payer: Self-pay | Admitting: Internal Medicine

## 2016-09-26 DIAGNOSIS — R197 Diarrhea, unspecified: Secondary | ICD-10-CM

## 2016-10-18 ENCOUNTER — Ambulatory Visit (INDEPENDENT_AMBULATORY_CARE_PROVIDER_SITE_OTHER): Payer: 59 | Admitting: Psychiatry

## 2016-10-18 ENCOUNTER — Other Ambulatory Visit (INDEPENDENT_AMBULATORY_CARE_PROVIDER_SITE_OTHER): Payer: Self-pay | Admitting: Internal Medicine

## 2016-10-18 ENCOUNTER — Encounter (HOSPITAL_COMMUNITY): Payer: Self-pay | Admitting: Psychiatry

## 2016-10-18 VITALS — BP 116/75 | HR 85 | Ht 67.0 in | Wt 153.0 lb

## 2016-10-18 DIAGNOSIS — G4700 Insomnia, unspecified: Secondary | ICD-10-CM

## 2016-10-18 DIAGNOSIS — Z91040 Latex allergy status: Secondary | ICD-10-CM

## 2016-10-18 DIAGNOSIS — Z9049 Acquired absence of other specified parts of digestive tract: Secondary | ICD-10-CM

## 2016-10-18 DIAGNOSIS — Z91011 Allergy to milk products: Secondary | ICD-10-CM | POA: Diagnosis not present

## 2016-10-18 DIAGNOSIS — Z9889 Other specified postprocedural states: Secondary | ICD-10-CM

## 2016-10-18 DIAGNOSIS — Z888 Allergy status to other drugs, medicaments and biological substances status: Secondary | ICD-10-CM | POA: Diagnosis not present

## 2016-10-18 DIAGNOSIS — F332 Major depressive disorder, recurrent severe without psychotic features: Secondary | ICD-10-CM | POA: Diagnosis not present

## 2016-10-18 MED ORDER — LORAZEPAM 1 MG PO TABS: 1.0000 mg | ORAL_TABLET | Freq: Three times a day (TID) | ORAL | 2 refills | Status: DC

## 2016-10-18 MED ORDER — VENLAFAXINE HCL ER 150 MG PO CP24: 150.0000 mg | ORAL_CAPSULE | Freq: Every day | ORAL | 2 refills | Status: DC

## 2016-10-18 MED ORDER — LAMOTRIGINE 25 MG PO TABS: 50.0000 mg | ORAL_TABLET | Freq: Two times a day (BID) | ORAL | 2 refills | Status: DC

## 2016-10-18 NOTE — Progress Notes (Signed)
Patient ID: Lauren [name], female   DOB: May 08, 1951, 66 y.o.   MRN: [id number] Patient ID: Lauren [name], female   DOB: 1951/08/02, 66 y.o.   MRN: [id number] Patient ID: Lauren [name], female   DOB: 1951-08-18, 66 y.o.   MRN: [id number] Patient ID: Lauren [name], female   DOB: 03/29/1951, 66 y.o.   MRN: [id number] Patient ID: Lauren [name], female   DOB: 12-Jun-1951, 66 y.o.   MRN: [id number] Patient ID: Lauren [name], female   DOB: 04-19-1951, 66 y.o.   MRN: [id number] Patient ID: Lauren [name], female   DOB: [birth year], 66 y.o.   MRN: [id number] Patient ID: Lauren [name], female   DOB: 1951-06-14, 66 y.o.   MRN: [id number] Patient ID: Lauren [name], female   DOB: May 10, 1951, 66 y.o.   MRN: [id number] Patient ID: Lauren [name], female   DOB: [birth year], 66 y.o.   MRN: [id number] Patient ID: Lauren [name], female   DOB: 1951-06-05, 66 y.o.   MRN: [id number] Patient ID: Lauren [name], female   DOB: 03-11-1951, 66 y.o.   MRN: [id number] Patient ID: Lauren [name], female   DOB: 1950-11-15, 66 y.o.   MRN: [id number] Patient ID: Lauren [name], female   DOB: [birth year], 66 y.o.   MRN: [id number] Patient ID: Lauren [name], female   DOB: 12-Dec-1950, 66 y.o.   MRN: [id number] Patient ID: Lauren [name], female   DOB: 03/27/1951, 66 y.o.   MRN: [id number] Patient ID: Lauren [name], female   DOB: 23-Nov-1950, 66 y.o.   MRN: [id number] Patient ID: Lauren [name], female   DOB: 11/03/1950, 66 y.o.   MRN: [id number] [place] [address] Progress Note Lauren [name] MRN: [id number] DOB: 09-22-1950 Age: 66 y.o.  Date: 10/18/2016  Chief Complaint: Chief Complaint  Patient presents with  . Depression  . Manic Behavior  . Anxiety   Subjective: I'm doing ok  History of presenting illness This patient is a 66 year old married white female who lives with her husband. She has one stepchild. Her step son killed himself 7 years ago at age 11. He was an alcoholic. The patient is on disability for  fibromyalgia  The patient has a long history of depression. She was last hospitalized in 2010 after she took too many pain pills and got confused. She use to drink heavily but has been sober 23 years and is very active in [employer] and church. For the most part she feels her mood is stable. She sleeping pretty well. Her energy is good. She has a hard time functioning without Ativan but she only usually takes one pill a day side think this is reasonable. She tried a higher dose of Cymbalta but it made her manic and revved up  The patient returns after 3 months. She has been stressed because she is been sick with the flu for several weeks. Her husband continues to overuse pain medicine she's very worried about this. She stopped the Lamictal because she ran out and was taking a few extra each day and I told her not to do this again. She is going to restart at a lower dose. She states her mood is been fairly stable but she needs to get a new therapist since her therapist left here Allergies: Allergies  Allergen Reactions  . Elavil [Amitriptyline] Other (See Comments)    Vivid dreams and almost suicidal  . Flagyl [Metronidazole] Itching  . Vistaril [Hydroxyzine Hcl] Other (See Comments)  Got higher than a kite on too much of this.  Lindajo Royal [Ziprasidone Hydrochloride] Other (See Comments)    Blacked out  . Lactose Intolerance (Gi) Other (See Comments)    GI upset  . Latex Other (See Comments)    Red at site  Medical History: Past Medical History:  Diagnosis Date  . Anxiety   . Chronic pain   . COPD (chronic obstructive pulmonary disease) ([place])   . Depression   . Fibromyalgia   . Fibromyalgia   . GERD (gastroesophageal reflux disease)   . Hepatitis C   . History of blood transfusion   . History of bronchitis   . IBS (irritable bowel syndrome)   . Osteoarthritis   . Pneumonia   . Prediabetes   . Shortness of breath dyspnea    allergies; increased pain;   . Vertigo   . Wears glasses    Surgical History: Past Surgical History:  Procedure Laterality Date  . CHOLECYSTECTOMY    . COLONOSCOPY    . COLONOSCOPY N/A 10/23/2014   Procedure: COLONOSCOPY;  Surgeon: Rogene Houston, MD;  [facility];  Service: Endoscopy;  Laterality: N/A;  1030  . ESOPHAGOGASTRODUODENOSCOPY N/A 10/23/2014   Procedure: ESOPHAGOGASTRODUODENOSCOPY (EGD);  Surgeon: Rogene Houston, MD;  [facility];  Service: Endoscopy;  Laterality: N/A;  . KNEE ARTHROSCOPY Left 10/21/2015   Procedure: ARTHROSCOPY LEFT KNEE WITH MENICAL DEBRIDEMENT, Chondroplasty;  Surgeon: Gaynelle Arabian, MD;  [facility];  Service: Orthopedics;  Laterality: Left;  Marland Kitchen MANDIBLE SURGERY  09/06/1979  . ORIF WRIST FRACTURE Right 07/23/2013   Procedure: OPEN REDUCTION INTERNAL FIXATION (ORIF) WRIST FRACTURE;  Surgeon: Roseanne Kaufman, MD;  Location: [facility];  Service: Orthopedics;  Laterality: Right;  . TONSILLECTOMY    . TOTAL KNEE ARTHROPLASTY     2011 rt knee  . UPPER GASTROINTESTINAL ENDOSCOPY    Family History family history includes Alcohol abuse in her cousin, father, maternal grandfather, maternal grandmother, paternal grandfather, paternal grandmother, and paternal uncle; Anxiety disorder in her maternal uncle and paternal uncle; Depression in her father; Diabetes in her brother, brother, and father; Healthy in her brother; Heart disease in her brother; Irritable bowel syndrome in her mother; OCD in her brother and brother; Sexual abuse in her mother; Stroke in her father and mother. Reviewed al this again to day in the appointment and nothing has changed  Mental status examination Patient is neatly dressed and groomed She she states her mood is Anxious and her affect is congruent.She denies being depressed She denies paranoia and she denies any hallucination. She denies suicidal and homicidal ideation her speech is clear and understandable, she no longer has sped up speech or agitation She denies auditory and  visual hallucinations or paranoia Her attention and concentration is fair. Her thought process is normal  She's alert and oriented x3 and her insight judgment and impulse control are good  Lab Results:  Recent Results (from the past 8736 hour(s))  Glucose, capillary   Collection Time: 10/21/15  1:45 PM  Result Value Ref Range   Glucose-Capillary 89 65 - 99 mg/dL   Comment 1 Document in Chart   Hepatitis C RNA quantitative   Collection Time: 07/06/16  9:50 AM  Result Value Ref Range   HCV Quantitative Not Detected <15 IU/mL   HCV Quantitative Log NOT CALC <1.18 log 10  CBC with Differential/Platelet   Collection Time: 07/06/16 10:24 AM  Result Value Ref Range   WBC 5.1 3.8 -  10.8 K/uL   RBC 4.53 3.80 - 5.10 MIL/uL   Hemoglobin 12.8 11.7 - 15.5 g/dL   HCT 39.4 35.0 - 45.0 %   MCV 87.0 80.0 - 100.0 fL   MCH 28.3 27.0 - 33.0 pg   MCHC 32.5 32.0 - 36.0 g/dL   RDW 13.1 11.0 - 15.0 %   Platelets 212 140 - 400 K/uL   MPV 10.3 7.5 - 12.5 fL   Neutro Abs 2,295 1,500 - 7,800 cells/uL   Lymphs Abs 1,989 850 - 3,900 cells/uL   Monocytes Absolute 459 200 - 950 cells/uL   Eosinophils Absolute 306 15 - 500 cells/uL   Basophils Absolute 51 0 - 200 cells/uL   Neutrophils Relative % 45 %   Lymphocytes Relative 39 %   Monocytes Relative 9 %   Eosinophils Relative 6 %   Basophils Relative 1 %   Smear Review Criteria for review not met   Hepatic function panel   Collection Time: 07/06/16 10:24 AM  Result Value Ref Range   Total Bilirubin 0.4 0.2 - 1.2 mg/dL   Bilirubin, Direct 0.1 <=0.2 mg/dL   Indirect Bilirubin 0.3 0.2 - 1.2 mg/dL   Alkaline Phosphatase 48 33 - 130 U/L   AST 20 10 - 35 U/L   ALT 16 6 - 29 U/L   Total Protein 7.1 6.1 - 8.1 g/dL   Albumin 4.2 3.6 - 5.1 g/dL  Family doctor is following her labs  Assessment Axis I Maj. depressive disorder, insomnia, pain issues, HIstory of alcoholism. Axis II deferred Axis III see medical history Axis IV mild to moderate  Plan: She  will continue Lamictal 50 mg twice a day for mood stabilization.However since she's been off it for a couple of weeks she'll restart at 25 mg twice a day for 2 weeks She'll continue Effexor XR 150 mg daily for depression. She will also continue Ativan1 mg 2 times a day for anxiety. She is no longer using trazodoneShe'll return in 3 months  MEDICATIONS this encounter: Meds ordered this encounter  Medications  . Budesonide-Formoterol Fumarate (SYMBICORT IN)    Sig: Inhale 2 puffs into the lungs 2 (two) times daily.  Marland Kitchen lamoTRIgine (LAMICTAL) 25 MG tablet    Sig: Take 2 tablets (50 mg total) by mouth 2 (two) times daily.    Dispense:  120 tablet    Refill:  2  . venlafaxine XR (EFFEXOR XR) 150 MG 24 hr capsule    Sig: Take 1 capsule (150 mg total) by mouth daily with breakfast.    Dispense:  90 capsule    Refill:  2  . LORazepam (ATIVAN) 1 MG tablet    Sig: Take 1 tablet (1 mg total) by mouth 3 (three) times daily.    Dispense:  90 tablet    Refill:  2    Medical Decision Making Problem Points:  Established problem, worsening (2), Review of last therapy session (1) and Review of psycho-social stressors (1) Data Points:  Review or order clinical lab tests (1) Review of medication regiment & side effects (2) Review of new medications or change in dosage (2)  I certify that outpatient services furnished can reasonably be expected to improve the patient's condition.   Levonne Spiller, MD

## 2016-11-07 ENCOUNTER — Ambulatory Visit (HOSPITAL_COMMUNITY): Admitting: Psychiatry

## 2016-11-08 NOTE — Progress Notes (Signed)
Lauren [name] is a 66 y.o. female patient. Pt. Met with counselor for approximately 30 minutes. Pt. Requested provider in [place] office. Counselor inquired about availability of provider in [place] office who is not currently accepting patients. Pt. Declined to continue with scheduled CCA.       Nancie Neas, LPC

## 2016-11-09 ENCOUNTER — Ambulatory Visit (HOSPITAL_COMMUNITY): Payer: Self-pay | Admitting: Psychiatry

## 2016-12-01 ENCOUNTER — Telehealth (HOSPITAL_COMMUNITY): Payer: Self-pay | Admitting: *Deleted

## 2016-12-01 NOTE — Telephone Encounter (Signed)
Received request for 90 day supply of Lamictal. Called pharmacy and was told that patient is requesting and not the insurance so patient can wait until next appointment 12/16/16 to discuss with her MD.

## 2016-12-06 NOTE — Telephone Encounter (Signed)
noted 

## 2016-12-16 ENCOUNTER — Ambulatory Visit (HOSPITAL_COMMUNITY): Payer: Self-pay | Admitting: Psychiatry

## 2016-12-20 ENCOUNTER — Ambulatory Visit (INDEPENDENT_AMBULATORY_CARE_PROVIDER_SITE_OTHER): Payer: 59 | Admitting: Psychiatry

## 2016-12-20 DIAGNOSIS — F332 Major depressive disorder, recurrent severe without psychotic features: Secondary | ICD-10-CM | POA: Diagnosis not present

## 2016-12-21 ENCOUNTER — Ambulatory Visit (INDEPENDENT_AMBULATORY_CARE_PROVIDER_SITE_OTHER): Payer: 59 | Admitting: Psychiatry

## 2016-12-21 ENCOUNTER — Encounter (HOSPITAL_COMMUNITY): Payer: Self-pay | Admitting: Psychiatry

## 2016-12-21 VITALS — BP 115/75 | HR 103 | Ht 67.0 in | Wt 153.4 lb

## 2016-12-21 DIAGNOSIS — F419 Anxiety disorder, unspecified: Secondary | ICD-10-CM

## 2016-12-21 DIAGNOSIS — Z811 Family history of alcohol abuse and dependence: Secondary | ICD-10-CM | POA: Diagnosis not present

## 2016-12-21 DIAGNOSIS — F332 Major depressive disorder, recurrent severe without psychotic features: Secondary | ICD-10-CM

## 2016-12-21 DIAGNOSIS — Z818 Family history of other mental and behavioral disorders: Secondary | ICD-10-CM

## 2016-12-21 MED ORDER — VENLAFAXINE HCL ER 150 MG PO CP24: 150.0000 mg | ORAL_CAPSULE | Freq: Every day | ORAL | 2 refills | Status: DC

## 2016-12-21 MED ORDER — VENLAFAXINE HCL ER 75 MG PO CP24: 75.0000 mg | ORAL_CAPSULE | Freq: Two times a day (BID) | ORAL | 2 refills | Status: DC

## 2016-12-21 MED ORDER — LORAZEPAM 1 MG PO TABS: 1.0000 mg | ORAL_TABLET | Freq: Three times a day (TID) | ORAL | 2 refills | Status: DC

## 2016-12-21 MED ORDER — LAMOTRIGINE 25 MG PO TABS: 50.0000 mg | ORAL_TABLET | Freq: Two times a day (BID) | ORAL | 2 refills | Status: DC

## 2016-12-21 NOTE — Progress Notes (Signed)
Patient ID: Lauren [name], female   DOB: [birth year], 66 y.o.   MRN: [id number] Patient ID: CHARLE [name], female   DOB: [birth year], 66 y.o.   MRN: [id number] Patient ID: Lauren [name], female   DOB: 03-05-1951, 66 y.o.   MRN: [id number] Patient ID: Lauren [name], female   DOB: 06/16/1951, 66 y.o.   MRN: [id number] Patient ID: Lauren [name], female   DOB: 01-09-1951, 66 y.o.   MRN: [id number] Patient ID: Lauren [name], female   DOB: 09-Dec-1950, 66 y.o.   MRN: [id number] Patient ID: Lauren [name], female   DOB: 1951-03-31, 66 y.o.   MRN: [id number] Patient ID: Lauren [name], female   DOB: Oct 27, 1950, 66 y.o.   MRN: [id number] Patient ID: Lauren [name], female   DOB: 1951/04/19, 66 y.o.   MRN: [id number] Patient ID: Lauren [name], female   DOB: 1950-10-04, 66 y.o.   MRN: [id number] Patient ID: Lauren [name], female   DOB: 06-Aug-1951, 66 y.o.   MRN: [id number] Patient ID: Lauren [name], female   DOB: 11-Aug-1951, 66 y.o.   MRN: [id number] Patient ID: Lauren [name], female   DOB: 06-27-1951, 66 y.o.   MRN: [id number] Patient ID: Lauren [name], female   DOB: 01-14-1951, 66 y.o.   MRN: [id number] Patient ID: Lauren [name], female   DOB: 10-Nov-1950, 66 y.o.   MRN: [id number] Patient ID: Lauren [name], female   DOB: 1951-06-25, 66 y.o.   MRN: [id number] Patient ID: Lauren [name], female   DOB: 1951-02-03, 66 y.o.   MRN: [id number] Patient ID: Lauren [name], female   DOB: 04/09/1951, 66 y.o.   MRN: [id number] [place] [address] Progress Note Lauren [name] MRN: [id number] DOB: 1951-01-16 Age: 66 y.o.  Date: 12/21/2016  Chief Complaint: Chief Complaint  Patient presents with  . Depression  . Follow-up  . Anxiety   Subjective: I'm doing ok  History of presenting illness This patient is a 66 year old married white female who lives with her husband. She has one stepchild. Her step son killed himself 7 years ago at age 20. He was an alcoholic. The patient is on disability for  fibromyalgia  The patient has a long history of depression. She was last hospitalized in 2010 after she took too many pain pills and got confused. She use to drink heavily but has been sober 23 years and is very active in [employer] and church. For the most part she feels her mood is stable. She sleeping pretty well. Her energy is good. She has a hard time functioning without Ativan but she only usually takes one pill a day side think this is reasonable. She tried a higher dose of Cymbalta but it made her manic and revved up  The patient returns after 2 months. She has been doing okay. She still very concerned about her husband who overuses pain medicine but she realizes there is nothing she can do about it. She continues to be active in [employer] her church and with her friends. She sleeping well most of the time but sometimes feels like the Effexor wears off and would like to  change it back to 75 mg twice a day. She denies manic symptoms or severe depression or suicidal ideation. She is bright and talkative today Allergies: Allergies  Allergen Reactions  . Elavil [Amitriptyline] Other (See Comments)    Vivid dreams and almost suicidal  . Flagyl [Metronidazole] Itching  . Vistaril [Hydroxyzine Hcl] Other (See Comments)  Got higher than a kite on too much of this.  Lindajo Royal [Ziprasidone Hydrochloride] Other (See Comments)    Blacked out  . Lactose Intolerance (Gi) Other (See Comments)    GI upset  . Latex Other (See Comments)    Red at site  Medical History: Past Medical History:  Diagnosis Date  . Anxiety   . Chronic pain   . COPD (chronic obstructive pulmonary disease) ([place])   . Depression   . Fibromyalgia   . Fibromyalgia   . GERD (gastroesophageal reflux disease)   . Hepatitis C   . History of blood transfusion   . History of bronchitis   . IBS (irritable bowel syndrome)   . Osteoarthritis   . Pneumonia   . Prediabetes   . Shortness of breath dyspnea    allergies; increased pain;   .  Vertigo   . Wears glasses   Surgical History: Past Surgical History:  Procedure Laterality Date  . CHOLECYSTECTOMY    . COLONOSCOPY    . COLONOSCOPY N/A 10/23/2014   Procedure: COLONOSCOPY;  Surgeon: Rogene Houston, MD;  [facility];  Service: Endoscopy;  Laterality: N/A;  1030  . ESOPHAGOGASTRODUODENOSCOPY N/A 10/23/2014   Procedure: ESOPHAGOGASTRODUODENOSCOPY (EGD);  Surgeon: Rogene Houston, MD;  [facility];  Service: Endoscopy;  Laterality: N/A;  . KNEE ARTHROSCOPY Left 10/21/2015   Procedure: ARTHROSCOPY LEFT KNEE WITH MENICAL DEBRIDEMENT, Chondroplasty;  Surgeon: Gaynelle Arabian, MD;  [facility];  Service: Orthopedics;  Laterality: Left;  Marland Kitchen MANDIBLE SURGERY  09/06/1979  . ORIF WRIST FRACTURE Right 07/23/2013   Procedure: OPEN REDUCTION INTERNAL FIXATION (ORIF) WRIST FRACTURE;  Surgeon: Roseanne Kaufman, MD;  Location: [facility];  Service: Orthopedics;  Laterality: Right;  . TONSILLECTOMY    . TOTAL KNEE ARTHROPLASTY     2011 rt knee  . UPPER GASTROINTESTINAL ENDOSCOPY    Family History family history includes Alcohol abuse in her cousin, father, maternal grandfather, maternal grandmother, paternal grandfather, paternal grandmother, and paternal uncle; Anxiety disorder in her maternal uncle and paternal uncle; Depression in her father; Diabetes in her brother, brother, and father; Healthy in her brother; Heart disease in her brother; Irritable bowel syndrome in her mother; OCD in her brother and brother; Sexual abuse in her mother; Stroke in her father and mother. Reviewed al this again to day in the appointment and nothing has changed  Mental status examination Patient is neatly dressed and groomed She she states her mood is good and her affect is bright She denies being depressed She denies paranoia and she denies any hallucination. She denies suicidal and homicidal ideation her speech is clear and understandable, she no longer has sped up speech or agitation She  denies auditory and visual hallucinations or paranoia Her attention and concentration is fair. Her thought process is normal  She's alert and oriented x3 and her insight judgment and impulse control are good  Lab Results:  Results for orders placed or performed in visit on 07/06/16 (from the past 8736 hour(s))  Hepatitis C RNA quantitative   Collection Time: 07/06/16  9:50 AM  Result Value Ref Range   HCV Quantitative Not Detected <15 IU/mL   HCV Quantitative Log NOT CALC <1.18 log 10  CBC with Differential/Platelet   Collection Time: 07/06/16 10:24 AM  Result Value Ref Range   WBC 5.1 3.8 - 10.8 K/uL   RBC 4.53 3.80 - 5.10 MIL/uL   Hemoglobin 12.8 11.7 - 15.5 g/dL   HCT 39.4 35.0 -  45.0 %   MCV 87.0 80.0 - 100.0 fL   MCH 28.3 27.0 - 33.0 pg   MCHC 32.5 32.0 - 36.0 g/dL   RDW 13.1 11.0 - 15.0 %   Platelets 212 140 - 400 K/uL   MPV 10.3 7.5 - 12.5 fL   Neutro Abs 2,295 1,500 - 7,800 cells/uL   Lymphs Abs 1,989 850 - 3,900 cells/uL   Monocytes Absolute 459 200 - 950 cells/uL   Eosinophils Absolute 306 15 - 500 cells/uL   Basophils Absolute 51 0 - 200 cells/uL   Neutrophils Relative % 45 %   Lymphocytes Relative 39 %   Monocytes Relative 9 %   Eosinophils Relative 6 %   Basophils Relative 1 %   Smear Review Criteria for review not met   Hepatic function panel   Collection Time: 07/06/16 10:24 AM  Result Value Ref Range   Total Bilirubin 0.4 0.2 - 1.2 mg/dL   Bilirubin, Direct 0.1 <=0.2 mg/dL   Indirect Bilirubin 0.3 0.2 - 1.2 mg/dL   Alkaline Phosphatase 48 33 - 130 U/L   AST 20 10 - 35 U/L   ALT 16 6 - 29 U/L   Total Protein 7.1 6.1 - 8.1 g/dL   Albumin 4.2 3.6 - 5.1 g/dL  Family doctor is following her labs  Assessment Axis I Maj. depressive disorder, insomnia, pain issues, HIstory of alcoholism. Axis II deferred Axis III see medical history Axis IV mild to moderate  Plan: She will continue Lamictal 50 mg twice a day for mood stabilization. She'll continue Effexor  XR but split the dose to 75 mg twice a day  for depression. She will also continue Ativan1 mg 2 times a day for anxiety. She'll return in 3 months  MEDICATIONS this encounter: Meds ordered this encounter  Medications  . gabapentin (NEURONTIN) 100 MG capsule    Sig: Take 100 mg by mouth 3 (three) times daily.  Marland Kitchen DISCONTD: venlafaxine XR (EFFEXOR XR) 150 MG 24 hr capsule    Sig: Take 1 capsule (150 mg total) by mouth daily with breakfast.    Dispense:  90 capsule    Refill:  2  . lamoTRIgine (LAMICTAL) 25 MG tablet    Sig: Take 2 tablets (50 mg total) by mouth 2 (two) times daily.    Dispense:  120 tablet    Refill:  2  . LORazepam (ATIVAN) 1 MG tablet    Sig: Take 1 tablet (1 mg total) by mouth 3 (three) times daily.    Dispense:  90 tablet    Refill:  2  . venlafaxine XR (EFFEXOR XR) 75 MG 24 hr capsule    Sig: Take 1 capsule (75 mg total) by mouth 2 (two) times daily.    Dispense:  60 capsule    Refill:  2    Medical Decision Making Problem Points:  Established problem, worsening (2), Review of last therapy session (1) and Review of psycho-social stressors (1) Data Points:  Review or order clinical lab tests (1) Review of medication regiment & side effects (2) Review of new medications or change in dosage (2)  I certify that outpatient services furnished can reasonably be expected to improve the patient's condition.   Levonne Spiller, MDPatient ID: Lauren [name], female   DOB: Oct 24, 1950, 66 y.o.   MRN: [id number]

## 2016-12-22 NOTE — Progress Notes (Signed)
   THERAPIST PROGRESS NOTE  Session Time: 2:00-3:00  Participation Level: Active  Behavioral Response: CasualAlertEuthymic  Type of Therapy: Individual Therapy  Treatment Goals addressed: coping; setting relationship boundaries; management of depression  Interventions: CBT  Summary: Lauren [name] is a 66 y.o. female who presents with major depressive disorder. Pt. Discussed patterns of co-dependent relationships with her ex-husband, current husband, and other family members. Pt. Also discussed patterns of shame in relationships and fears of being vulnerable and judged for her current relationship.  Suicidal/Homicidal: Nowithout intent/plan  Therapist Response: This was patient's first session since transfer from her therapist in the [place] office. Session focused on developing Pt.'s relationship and family history, development of current counseling needs, and development of therapeutic rapport. Counselor provided some gentle confrontation related to Pt.'s patterns of excessive caregiving/co-dependence and denying herself her emotional and socialization needs.  Plan: Return again in 4 weeks.  Diagnosis: Axis I: Depressive Disorder NOS    Axis II: No diagnosis    Nancie Neas, [facility] 12/22/2016

## 2017-01-12 DIAGNOSIS — M1712 Unilateral primary osteoarthritis, left knee: Secondary | ICD-10-CM | POA: Diagnosis not present

## 2017-01-17 ENCOUNTER — Ambulatory Visit (INDEPENDENT_AMBULATORY_CARE_PROVIDER_SITE_OTHER): Payer: 59 | Admitting: Psychiatry

## 2017-01-17 DIAGNOSIS — F332 Major depressive disorder, recurrent severe without psychotic features: Secondary | ICD-10-CM

## 2017-01-25 ENCOUNTER — Encounter (HOSPITAL_COMMUNITY): Payer: Self-pay

## 2017-01-25 DIAGNOSIS — R0789 Other chest pain: Secondary | ICD-10-CM | POA: Insufficient documentation

## 2017-01-25 DIAGNOSIS — J449 Chronic obstructive pulmonary disease, unspecified: Secondary | ICD-10-CM | POA: Insufficient documentation

## 2017-01-25 DIAGNOSIS — Z79899 Other long term (current) drug therapy: Secondary | ICD-10-CM | POA: Diagnosis not present

## 2017-01-25 DIAGNOSIS — Z7982 Long term (current) use of aspirin: Secondary | ICD-10-CM | POA: Diagnosis not present

## 2017-01-25 DIAGNOSIS — Z87891 Personal history of nicotine dependence: Secondary | ICD-10-CM | POA: Diagnosis not present

## 2017-01-25 DIAGNOSIS — R079 Chest pain, unspecified: Secondary | ICD-10-CM | POA: Diagnosis not present

## 2017-01-25 DIAGNOSIS — M542 Cervicalgia: Secondary | ICD-10-CM | POA: Diagnosis not present

## 2017-01-25 DIAGNOSIS — R0602 Shortness of breath: Secondary | ICD-10-CM | POA: Diagnosis not present

## 2017-01-25 NOTE — ED Triage Notes (Signed)
Chest pain x 3 days with pain in left side of neck and left jaw intermittently.   Pt states she has been under a lot of stress recently and feels this is related

## 2017-01-26 ENCOUNTER — Emergency Department (HOSPITAL_COMMUNITY)
Admission: EM | Admit: 2017-01-26 | Discharge: 2017-01-26 | Disposition: A | Discharge status: Home / Self Care | Payer: Medicare Other | Attending: Emergency Medicine | Admitting: Emergency Medicine

## 2017-01-26 ENCOUNTER — Emergency Department (HOSPITAL_COMMUNITY): Payer: Medicare Other

## 2017-01-26 ENCOUNTER — Ambulatory Visit (INDEPENDENT_AMBULATORY_CARE_PROVIDER_SITE_OTHER): Payer: 59 | Admitting: Licensed Clinical Social Worker

## 2017-01-26 DIAGNOSIS — R0602 Shortness of breath: Secondary | ICD-10-CM | POA: Diagnosis not present

## 2017-01-26 DIAGNOSIS — R079 Chest pain, unspecified: Secondary | ICD-10-CM | POA: Diagnosis not present

## 2017-01-26 DIAGNOSIS — F330 Major depressive disorder, recurrent, mild: Secondary | ICD-10-CM

## 2017-01-26 DIAGNOSIS — R0789 Other chest pain: Secondary | ICD-10-CM

## 2017-01-26 DIAGNOSIS — F411 Generalized anxiety disorder: Secondary | ICD-10-CM

## 2017-01-26 LAB — BASIC METABOLIC PANEL
Anion gap: 8 (ref 5–15)
BUN: 20 mg/dL (ref 6–20)
CALCIUM: 9.3 mg/dL (ref 8.9–10.3)
CO2: 28 mmol/L (ref 22–32)
CREATININE: 0.76 mg/dL (ref 0.44–1.00)
Chloride: 103 mmol/L (ref 101–111)
Glucose, Bld: 120 mg/dL — ABNORMAL HIGH (ref 65–99)
Potassium: 3.7 mmol/L (ref 3.5–5.1)
SODIUM: 139 mmol/L (ref 135–145)

## 2017-01-26 LAB — TROPONIN I

## 2017-01-26 LAB — CBC
HCT: 39.3 % (ref 36.0–46.0)
Hemoglobin: 12.8 g/dL (ref 12.0–15.0)
MCH: 28.9 pg (ref 26.0–34.0)
MCHC: 32.6 g/dL (ref 30.0–36.0)
MCV: 88.7 fL (ref 78.0–100.0)
PLATELETS: 194 10*3/uL (ref 150–400)
RBC: 4.43 MIL/uL (ref 3.87–5.11)
RDW: 12.7 % (ref 11.5–15.5)
WBC: 6.9 10*3/uL (ref 4.0–10.5)

## 2017-01-26 LAB — I-STAT TROPONIN, ED: TROPONIN I, POC: 0 ng/mL (ref 0.00–0.08)

## 2017-01-26 MED ORDER — ASPIRIN 81 MG PO CHEW
324.0000 mg | CHEWABLE_TABLET | Freq: Once | ORAL | Status: AC
  Administered 2017-01-26: 324 mg via ORAL
  Filled 2017-01-26: qty 4

## 2017-01-26 MED ORDER — NITROGLYCERIN 2 % TD OINT
1.0000 [in_us] | TOPICAL_OINTMENT | Freq: Once | TRANSDERMAL | Status: AC
  Administered 2017-01-26: 1 [in_us] via TOPICAL
  Filled 2017-01-26: qty 1

## 2017-01-26 MED ORDER — METHOCARBAMOL 500 MG PO TABS: 500.0000 mg | ORAL_TABLET | Freq: Four times a day (QID) | ORAL | 0 refills | Status: DC | PRN

## 2017-01-26 MED ORDER — NAPROXEN 250 MG PO TABS: 250.0000 mg | ORAL_TABLET | Freq: Two times a day (BID) | ORAL | 0 refills | Status: DC

## 2017-01-26 NOTE — Progress Notes (Signed)
   THERAPIST PROGRESS NOTE   Session Time: 2:00-3:00  Participation Level: Active  Behavioral Response: CasualAlertDepressed  Type of Therapy: Individual Therapy  Treatment Goals addressed: coping; setting relationship boundaries; management of depression  Interventions: CBT  Summary: Lauren [name] is a 66 y.o. female who presents with major depressive disorder. Pt. Continues to focus on patterns of co-dependence in relationship with her current husband.   Suicidal/Homicidal: Nowithout intent/plan  Therapist Response: Pt. Presented as tearful, overwhelmed. Pt. Discussed recent confrontation with her husband who is abusing prescription medication. Pt. Attempted to get him help for his addiction at [facility] hospital. Pt. And her husband left the hospital after several hours without receiving assistance. Counselor discussed excessive caregiving, fears of abandonment, love that she has for her husband and not wanting him to feel abandoned. Counselor suggested that it was time for pt. To begin caring for herself as much as she cares for her husband. Counselor offered suggestions for her considerations such as making a daily plan/routine that she commits to. Pt. Discussed her fears of her husband's use of guilt to manipulate her. Pt. Was encouraged that accept that the guilt was part of the pattern that was keeping her trapped, to expect the guilt, and begin process of sitting with the discomfort and adhering to her daily routine anyway.  Plan: Return again in 4 weeks.  Diagnosis:      Axis I: Depressive Disorder NOS                          Axis II: No diagnosis   Nancie Neas, [facility] 01/26/2017

## 2017-01-26 NOTE — ED Notes (Signed)
Pt a&o x 4, vs stable, RX/verbal/written discharge instructions given, verbalized understanding, pt ambulated off unit with steady gait in stable condition

## 2017-01-26 NOTE — Discharge Instructions (Signed)
Use ice and heat on the painful areas for comfort. Take the medications as prescribed. Call Dr [facility] Joy office to get an appointment to evaluate your heart.  Return to the ED if you feel worse instead of better.

## 2017-01-26 NOTE — ED Provider Notes (Signed)
[place] DEPT Provider Note   CSN: [id number] Arrival date & time: 01/25/17  2340  By signing my name below, I, Lauren [name], attest that this documentation has been prepared under the direction and in the presence of Lauren Porter, MD. Electronically Signed: Margit [name], ED Scribe. 01/26/17. 12:49 AM.  Time Seen: 00:35 AM  History   Chief Complaint Chief Complaint  Patient presents with  . Chest Pain    x 3 days    HPI Lauren [name] is a 66 y.o. female who presents to the Emergency Department complaining of intermittent left arm and jaw  pain that started on Monday, 01/23/17. She also notes pain in her jaw, chest and neck. Neck pain and chest tightness started ~ 10:30pm on 01/25/17, while watching TV. Pt notes the left arm and jaw pain will last for ~ 30 minutes - 1 hour at a time since it started a couple of days ago. Deep breathing exacerbates her chest pain. She hasn't taken anything to alleviate her sx. Pt also c/o being very stressed because of issues with her husband's health and Her husband's nephew who has started to live with them and she states "I have to take care of everybody". Stress has been gradually worsening the last few weeks. She has been eating and drinking normally. Pt hasn't taken her Asprin in ~ 4-5 days. FHx of heart disease. States her father died at age 32 from stroke and heart problems, her brother died at age 26 from CHF. Doesn't smoke or drink. Takes oxycodone as needed for her left knee pain, which she states she needs a TKR.   She states at age 68 she saw a cardiologist and was told she was having gastric problems. This was done at [facility]. She does state she is depressed but she denies suicidal or homicidal ideation. She states she has been prediabetic and she's had thirst for a year and polyuria and states she has 2 episodes of urination at night. Patient states she has a psychiatrist and therapist. She has an appointment tomorrow, May 2  this with her therapist..  PCP: Redmond School, MD  The history is provided by the patient. No language interpreter was used.    Past Medical History:  Diagnosis Date  . Anxiety   . Chronic pain   . COPD (chronic obstructive pulmonary disease) ([place])   . Depression   . Fibromyalgia   . Fibromyalgia   . GERD (gastroesophageal reflux disease)   . Hepatitis C   . History of blood transfusion   . History of bronchitis   . IBS (irritable bowel syndrome)   . Osteoarthritis   . Pneumonia   . Prediabetes   . Shortness of breath dyspnea    allergies; increased pain;   . Vertigo   . Wears glasses     Patient Active Problem List   Diagnosis Date Noted  . Acute medial meniscal tear 10/21/2015  . GERD (gastroesophageal reflux disease) 01/21/2015  . Fibromyalgia 09/11/2014  . MDD (major depressive disorder) 03/19/2014  . Pain 07/25/2012  . Insomnia due to mental disorder 07/25/2012  . OA (osteoarthritis) 02/14/2012  . Hepatitis C 02/14/2012  . IBS (irritable bowel syndrome) 02/14/2012  . Depression 12/27/2011    Past Surgical History:  Procedure Laterality Date  . CHOLECYSTECTOMY    . COLONOSCOPY    . COLONOSCOPY N/A 10/23/2014   Procedure: COLONOSCOPY;  Surgeon: Rogene Houston, MD;  Location: [facility];  Service: Endoscopy;  Laterality:  N/A;  1030  . ESOPHAGOGASTRODUODENOSCOPY N/A 10/23/2014   Procedure: ESOPHAGOGASTRODUODENOSCOPY (EGD);  Surgeon: Rogene Houston, MD;  Location: [facility];  Service: Endoscopy;  Laterality: N/A;  . KNEE ARTHROSCOPY Left 10/21/2015   Procedure: ARTHROSCOPY LEFT KNEE WITH MENICAL DEBRIDEMENT, Chondroplasty;  Surgeon: Gaynelle Arabian, MD;  Location: [facility];  Service: Orthopedics;  Laterality: Left;  Marland Kitchen MANDIBLE SURGERY  09/06/1979  . ORIF WRIST FRACTURE Right 07/23/2013   Procedure: OPEN REDUCTION INTERNAL FIXATION (ORIF) WRIST FRACTURE;  Surgeon: Roseanne Kaufman, MD;  Location: [employer];  Service: Orthopedics;  Laterality: Right;  . TONSILLECTOMY     . TOTAL KNEE ARTHROPLASTY     2011 rt knee  . UPPER GASTROINTESTINAL ENDOSCOPY      OB History    No data available       Home Medications    Prior to Admission medications   Medication Sig Start Date End Date Taking? Authorizing Provider  albuterol (PROAIR HFA) 108 (90 BASE) MCG/ACT inhaler Inhale 2 puffs into the lungs every 4 (four) hours as needed for wheezing or shortness of breath. 03/21/14  Yes Lindell Spar I, NP  aspirin EC 81 MG tablet Take 81 mg by mouth daily.   Yes [provider]  Budesonide-Formoterol Fumarate (SYMBICORT IN) Inhale 2 puffs into the lungs 2 (two) times daily.   Yes [provider]  calcium-vitamin D (OSCAL WITH D) 500-200 MG-UNIT per tablet Take 1 tablet by mouth 2 (two) times daily. For low calcium Patient taking differently: Take 1 tablet by mouth 2 (two) times daily as needed. For low calcium 03/21/14  Yes Nwoko, Herbert Pun I, NP  diclofenac sodium (VOLTAREN) 1 % GEL Apply 2 g topically 4 (four) times daily as needed (For pain.).  07/09/15  Yes [provider]  dicyclomine (BENTYL) 20 MG tablet Take 1 tablet (20 mg total) by mouth every 6 (six) hours as needed for spasms. 07/06/16  Yes Setzer, Rona Ravens, NP  diphenoxylate-atropine (LOMOTIL) 2.5-0.025 MG tablet Take 1 tablet by mouth 3 (three) times daily as needed for diarrhea or loose stools. 09/27/16  Yes Rehman, Mechele Dawley, MD  gabapentin (NEURONTIN) 100 MG capsule Take 100 mg by mouth 3 (three) times daily.   Yes [provider]  lamoTRIgine (LAMICTAL) 25 MG tablet Take 2 tablets (50 mg total) by mouth 2 (two) times daily. 12/21/16  Yes Cloria Spring, MD  LORazepam (ATIVAN) 1 MG tablet Take 1 tablet (1 mg total) by mouth 3 (three) times daily. 12/21/16  Yes Cloria Spring, MD  oxyCODONE (ROXICODONE) 5 MG immediate release tablet Take 1-2 tablets (5-10 mg total) by mouth every 4 (four) hours as needed for severe pain. 10/21/15  Yes Aluisio, Pilar Plate, MD  pravastatin (PRAVACHOL) 80  MG tablet Take 80 mg by mouth daily.   Yes [provider]  ranitidine (ZANTAC) 150 MG tablet TAKE 1 TABLET (150 MG TOTAL) BY MOUTH 2 (TWO) TIMES DAILY. 10/18/16  Yes Rehman, Mechele Dawley, MD  venlafaxine XR (EFFEXOR XR) 75 MG 24 hr capsule Take 1 capsule (75 mg total) by mouth 2 (two) times daily. 12/21/16 12/21/17 Yes Cloria Spring, MD  methocarbamol (ROBAXIN) 500 MG tablet Take 1 tablet (500 mg total) by mouth every 6 (six) hours as needed (muscle pain). 01/26/17   Lauren Porter, MD  naproxen (NAPROSYN) 250 MG tablet Take 1 tablet (250 mg total) by mouth 2 (two) times daily with a meal. 01/26/17   Lauren Porter, MD    Family History Family  History  Problem Relation Age of Onset  . Alcohol abuse Father   . Diabetes Father   . Stroke Father   . Depression Father   . Alcohol abuse Maternal Grandfather   . Alcohol abuse Maternal Grandmother   . Alcohol abuse Paternal Grandfather   . Alcohol abuse Paternal Grandmother   . Stroke Mother   . Irritable bowel syndrome Mother   . Sexual abuse Mother   . Diabetes Brother   . OCD Brother   . Healthy Brother   . OCD Brother   . Diabetes Brother   . Heart disease Brother   . Anxiety disorder Paternal Uncle   . Alcohol abuse Paternal Uncle   . Alcohol abuse Cousin   . Anxiety disorder Maternal Uncle   . ADD / ADHD Neg Hx   . Bipolar disorder Neg Hx   . Dementia Neg Hx   . Drug abuse Neg Hx   . Paranoid behavior Neg Hx   . Schizophrenia Neg Hx   . Seizures Neg Hx   . Physical abuse Neg Hx     Social History Social History  Substance Use Topics  . Smoking status: Former Smoker    Packs/day: 2.00    Years: 15.00    Types: Cigarettes    Quit date: 09/05/1982  . Smokeless tobacco: Never Used  . Alcohol use No     Comment: history of alcoholism; pt states has been sober 25 years  lives at home Lives with spouse   Allergies   Elavil [amitriptyline]; Flagyl [metronidazole]; Vistaril [hydroxyzine hcl]; Geodon [ziprasidone  hydrochloride]; Lactose intolerance (gi); and Latex   Review of Systems Review of Systems  Respiratory: Positive for chest tightness.   Cardiovascular: Positive for chest pain.  Musculoskeletal: Positive for neck pain.  All other systems reviewed and are negative.    Physical Exam Updated Vital Signs BP (!) 142/79 (BP Location: Right Arm)   Pulse 79   Temp 98.2 F (36.8 C) (Oral)   Resp 20   Ht 5\' 6"  (1.676 m)   Wt 150 lb (68 kg)   SpO2 97%   BMI 24.21 kg/m   Vital signs normal    Physical Exam  Constitutional: She is oriented to person, place, and time. She appears well-developed and well-nourished.  Non-toxic appearance. She does not appear ill. No distress.  HENT:  Head: Normocephalic and atraumatic.  Right Ear: External ear normal.  Left Ear: External ear normal.  Nose: Nose normal. No mucosal edema or rhinorrhea.  Mouth/Throat: Oropharynx is clear and moist and mucous membranes are normal. No dental abscesses or uvula swelling.  Eyes: Conjunctivae and EOM are normal. Pupils are equal, round, and reactive to light.  Neck: Normal range of motion and full passive range of motion without pain. Neck supple.    Tenderness to the left neck and it hurts when she moves from side to side. Area of tenderness noted.  Cardiovascular: Normal rate, regular rhythm and normal heart sounds.  Exam reveals no gallop and no friction rub.   No murmur heard. Pulmonary/Chest: Effort normal and breath sounds normal. No respiratory distress. She has no wheezes. She has no rhonchi. She has no rales. She exhibits no tenderness and no crepitus.  Abdominal: Soft. Normal appearance and bowel sounds are normal. She exhibits no distension. There is no tenderness. There is no rebound and no guarding.  Musculoskeletal: Normal range of motion. She exhibits no edema or tenderness.  Moves all extremities well.   Neurological: She  is alert and oriented to person, place, and time. She has normal  strength. No cranial nerve deficit.  Skin: Skin is warm, dry and intact. No rash noted. No erythema. No pallor.  Psychiatric: Her speech is normal and behavior is normal. Her mood appears anxious.  Nursing note and vitals reviewed.    ED Treatments / Results  DIAGNOSTIC STUDIES: Oxygen Saturation is 94% on RA, adequate by my interpretation.   Labs (all labs ordered are listed, but only abnormal results are displayed) Results for orders placed or performed during the hospital encounter of 87/56/43  Basic metabolic panel  Result Value Ref Range   Sodium 139 135 - 145 mmol/L   Potassium 3.7 3.5 - 5.1 mmol/L   Chloride 103 101 - 111 mmol/L   CO2 28 22 - 32 mmol/L   Glucose, Bld 120 (H) 65 - 99 mg/dL   BUN 20 6 - 20 mg/dL   Creatinine, Ser 0.76 0.44 - 1.00 mg/dL   Calcium 9.3 8.9 - 10.3 mg/dL   GFR calc non Af Amer >60 >60 mL/min   GFR calc Af Amer >60 >60 mL/min   Anion gap 8 5 - 15  CBC  Result Value Ref Range   WBC 6.9 4.0 - 10.5 K/uL   RBC 4.43 3.87 - 5.11 MIL/uL   Hemoglobin 12.8 12.0 - 15.0 g/dL   HCT 39.3 36.0 - 46.0 %   MCV 88.7 78.0 - 100.0 fL   MCH 28.9 26.0 - 34.0 pg   MCHC 32.6 30.0 - 36.0 g/dL   RDW 12.7 11.5 - 15.5 %   Platelets 194 150 - 400 K/uL  Troponin I  Result Value Ref Range   Troponin I <0.03 <0.03 ng/mL  Troponin I  Result Value Ref Range   Troponin I <0.03 <0.03 ng/mL   Laboratory interpretation all normal Including normal delta troponin   EKG  EKG Interpretation  Date/Time:  Wednesday Jan 25 2017 23:48:35 EDT Ventricular Rate:  79 PR Interval:  140 QRS Duration: 88 QT Interval:  374 QTC Calculation: 428 R Axis:   83 Text Interpretation:  Normal sinus rhythm Normal ECG No significant change since last tracing 13 Oct 2015 Confirmed by Lauren [name] [phone]) on 01/26/2017 12:47:36 AM       Radiology Dg Chest 2 View  Result Date: 01/26/2017 CLINICAL DATA:  Mid chest pain radiating to the jaw, neck, and arm. History of COPD, pneumonia,  pre diabetes, shortness of breath, former smoker. EXAM: CHEST  2 VIEW COMPARISON:  08/02/2016 FINDINGS: Normal heart size and pulmonary vascularity. No focal airspace disease or consolidation in the lungs. No blunting of costophrenic angles. No pneumothorax. Mediastinal contours appear intact. Anterior wedge deformity of a mid thoracic vertebra. No change since prior study. Calcification of the aorta. IMPRESSION: No active cardiopulmonary disease. Electronically Signed   By: Lucienne Capers M.D.   On: 01/26/2017 01:46    Procedures Procedures (including critical care time)  Medications Ordered in ED Medications  aspirin chewable tablet 324 mg (324 mg Oral Given 01/26/17 0102)  nitroGLYCERIN (NITROGLYN) 2 % ointment 1 inch (1 inch Topical Given 01/26/17 0102)     Initial Impression / Assessment and Plan / ED Course  I have reviewed the triage vital signs and the nursing notes.  Pertinent labs & imaging results that were available during my care of the patient were reviewed by me and considered in my medical decision making (see chart for details).    Final Clinical Impressions(s) / ED  Diagnoses   COORDINATION OF CARE: 12:49 AM-Discussed next steps with pt. Pt verbalized understanding and is agreeable with the plan. Laboratory testing was ordered, patient was given aspirin orally and nitroglycerin paste was placed on her chest.  Recheck at 2:20 AM patient states she's feeling better. We discussed her test results that were back. We discussed getting a delta troponin and that is normal she can be discharged home. I will refer her back to cardiology due to her family history of heart disease which makes her feel more comfortable. More than likely her discomfort is from the degree of stress she is under.  Final diagnoses:  Atypical chest pain    New Prescriptions New Prescriptions   METHOCARBAMOL (ROBAXIN) 500 MG TABLET    Take 1 tablet (500 mg total) by mouth every 6 (six) hours as needed  (muscle pain).   NAPROXEN (NAPROSYN) 250 MG TABLET    Take 1 tablet (250 mg total) by mouth 2 (two) times daily with a meal.   Plan discharge  Lauren Porter, MD, FACEP  I personally performed the services described in this documentation, which was scribed in my presence. The recorded information has been reviewed and considered.  Vital signs normal      Lauren Porter, MD 01/26/17 [phone]

## 2017-01-27 ENCOUNTER — Encounter (HOSPITAL_COMMUNITY): Payer: Self-pay | Admitting: Licensed Clinical Social Worker
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
## 2017-02-08 ENCOUNTER — Encounter (HOSPITAL_COMMUNITY): Payer: Self-pay | Admitting: Licensed Clinical Social Worker

## 2017-02-08 ENCOUNTER — Ambulatory Visit (INDEPENDENT_AMBULATORY_CARE_PROVIDER_SITE_OTHER): Payer: 59 | Admitting: Licensed Clinical Social Worker

## 2017-02-08 DIAGNOSIS — F411 Generalized anxiety disorder: Secondary | ICD-10-CM | POA: Diagnosis not present

## 2017-02-08 DIAGNOSIS — F330 Major depressive disorder, recurrent, mild: Secondary | ICD-10-CM | POA: Diagnosis not present
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
## 2017-02-09 ENCOUNTER — Other Ambulatory Visit (HOSPITAL_COMMUNITY): Payer: Self-pay | Admitting: Psychiatry

## 2017-02-09 ENCOUNTER — Telehealth (HOSPITAL_COMMUNITY): Payer: Self-pay | Admitting: *Deleted

## 2017-02-09 DIAGNOSIS — E1129 Type 2 diabetes mellitus with other diabetic kidney complication: Secondary | ICD-10-CM | POA: Diagnosis not present

## 2017-02-09 MED ORDER — VENLAFAXINE HCL ER 75 MG PO CP24: 75.0000 mg | ORAL_CAPSULE | Freq: Two times a day (BID) | ORAL | 2 refills | Status: DC

## 2017-02-09 NOTE — Telephone Encounter (Signed)
noted 

## 2017-02-09 NOTE — Telephone Encounter (Signed)
Pt pharmacy requesting 90 days supply for pt Venlafaxine HCL ER 75 mg BID. Pt medication was last refilled on 12-21-2016 with 60 caps and 2 refills. Pt pharmacy number is [phone].

## 2017-02-09 NOTE — Telephone Encounter (Signed)
sent 

## 2017-02-23 ENCOUNTER — Ambulatory Visit (HOSPITAL_COMMUNITY): Payer: Self-pay | Admitting: Licensed Clinical Social Worker

## 2017-03-01 ENCOUNTER — Ambulatory Visit (HOSPITAL_COMMUNITY): Payer: Self-pay | Admitting: Psychiatry

## 2017-03-02 ENCOUNTER — Ambulatory Visit (INDEPENDENT_AMBULATORY_CARE_PROVIDER_SITE_OTHER): Payer: 59 | Admitting: Psychiatry

## 2017-03-02 DIAGNOSIS — F330 Major depressive disorder, recurrent, mild: Secondary | ICD-10-CM

## 2017-03-02 DIAGNOSIS — F329 Major depressive disorder, single episode, unspecified: Secondary | ICD-10-CM | POA: Diagnosis not present

## 2017-03-02 DIAGNOSIS — M1712 Unilateral primary osteoarthritis, left knee: Secondary | ICD-10-CM | POA: Diagnosis not present

## 2017-03-03 ENCOUNTER — Other Ambulatory Visit (INDEPENDENT_AMBULATORY_CARE_PROVIDER_SITE_OTHER): Payer: Self-pay | Admitting: Internal Medicine

## 2017-03-03 DIAGNOSIS — R197 Diarrhea, unspecified: Secondary | ICD-10-CM
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
## 2017-03-09 DIAGNOSIS — M1712 Unilateral primary osteoarthritis, left knee: Secondary | ICD-10-CM | POA: Diagnosis not present

## 2017-03-15 DIAGNOSIS — G894 Chronic pain syndrome: Secondary | ICD-10-CM | POA: Diagnosis not present

## 2017-03-15 DIAGNOSIS — M1991 Primary osteoarthritis, unspecified site: Secondary | ICD-10-CM | POA: Diagnosis not present

## 2017-03-15 DIAGNOSIS — E782 Mixed hyperlipidemia: Secondary | ICD-10-CM | POA: Diagnosis not present

## 2017-03-15 DIAGNOSIS — E119 Type 2 diabetes mellitus without complications: Secondary | ICD-10-CM | POA: Diagnosis not present

## 2017-03-16 DIAGNOSIS — M1712 Unilateral primary osteoarthritis, left knee: Secondary | ICD-10-CM | POA: Diagnosis not present

## 2017-03-17 ENCOUNTER — Telehealth (HOSPITAL_COMMUNITY): Payer: Self-pay | Admitting: *Deleted

## 2017-03-17 NOTE — Telephone Encounter (Signed)
left message with patient's husband regarding provider out of office 03/20/17.    He said he will give her the message.

## 2017-03-17 NOTE — Telephone Encounter (Signed)
RETURNED PHONE CALL REGARDING PROVIDER OUT OF OFFICE 03/20/17.LEFT VOICE MESSAGE.

## 2017-03-20 ENCOUNTER — Ambulatory Visit (HOSPITAL_COMMUNITY): Payer: Self-pay | Admitting: Psychiatry

## 2017-03-28 ENCOUNTER — Encounter (HOSPITAL_COMMUNITY): Payer: Self-pay | Admitting: Psychiatry

## 2017-03-28 ENCOUNTER — Ambulatory Visit (INDEPENDENT_AMBULATORY_CARE_PROVIDER_SITE_OTHER): Payer: 59 | Admitting: Psychiatry

## 2017-03-28 VITALS — BP 128/84 | HR 100 | Ht 66.0 in | Wt 158.2 lb

## 2017-03-28 DIAGNOSIS — R52 Pain, unspecified: Secondary | ICD-10-CM

## 2017-03-28 DIAGNOSIS — F1099 Alcohol use, unspecified with unspecified alcohol-induced disorder: Secondary | ICD-10-CM

## 2017-03-28 DIAGNOSIS — F330 Major depressive disorder, recurrent, mild: Secondary | ICD-10-CM | POA: Diagnosis not present

## 2017-03-28 DIAGNOSIS — G4700 Insomnia, unspecified: Secondary | ICD-10-CM

## 2017-03-28 DIAGNOSIS — F411 Generalized anxiety disorder: Secondary | ICD-10-CM | POA: Diagnosis not present

## 2017-03-28 MED ORDER — LORAZEPAM 1 MG PO TABS: 1.0000 mg | ORAL_TABLET | Freq: Three times a day (TID) | ORAL | 2 refills | Status: DC

## 2017-03-28 MED ORDER — LAMOTRIGINE 25 MG PO TABS: 50.0000 mg | ORAL_TABLET | Freq: Two times a day (BID) | ORAL | 2 refills | Status: DC

## 2017-03-28 MED ORDER — VENLAFAXINE HCL ER 75 MG PO CP24: 75.0000 mg | ORAL_CAPSULE | Freq: Two times a day (BID) | ORAL | 2 refills | Status: DC
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
## 2017-04-05 ENCOUNTER — Ambulatory Visit (INDEPENDENT_AMBULATORY_CARE_PROVIDER_SITE_OTHER): Payer: 59 | Admitting: Psychiatry

## 2017-04-05 DIAGNOSIS — F330 Major depressive disorder, recurrent, mild: Secondary | ICD-10-CM | POA: Diagnosis not present

## 2017-04-06 DIAGNOSIS — L309 Dermatitis, unspecified: Secondary | ICD-10-CM | POA: Diagnosis not present

## 2017-04-06 DIAGNOSIS — M1991 Primary osteoarthritis, unspecified site: Secondary | ICD-10-CM | POA: Diagnosis not present

## 2017-04-06 DIAGNOSIS — I10 Essential (primary) hypertension: Secondary | ICD-10-CM | POA: Diagnosis not present

## 2017-04-06 DIAGNOSIS — L719 Rosacea, unspecified: Secondary | ICD-10-CM | POA: Diagnosis not present

## 2017-04-06 DIAGNOSIS — Z1389 Encounter for screening for other disorder: Secondary | ICD-10-CM | POA: Diagnosis not present
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
## 2017-04-12 DIAGNOSIS — R262 Difficulty in walking, not elsewhere classified: Secondary | ICD-10-CM | POA: Diagnosis not present

## 2017-04-12 DIAGNOSIS — M62552 Muscle wasting and atrophy, not elsewhere classified, left thigh: Secondary | ICD-10-CM | POA: Diagnosis not present

## 2017-04-12 DIAGNOSIS — M6259 Muscle wasting and atrophy, not elsewhere classified, multiple sites: Secondary | ICD-10-CM | POA: Diagnosis not present

## 2017-04-12 DIAGNOSIS — M25562 Pain in left knee: Secondary | ICD-10-CM | POA: Diagnosis not present

## 2017-04-18 DIAGNOSIS — M62552 Muscle wasting and atrophy, not elsewhere classified, left thigh: Secondary | ICD-10-CM | POA: Diagnosis not present

## 2017-04-18 DIAGNOSIS — M25562 Pain in left knee: Secondary | ICD-10-CM | POA: Diagnosis not present

## 2017-04-18 DIAGNOSIS — M6259 Muscle wasting and atrophy, not elsewhere classified, multiple sites: Secondary | ICD-10-CM | POA: Diagnosis not present

## 2017-04-18 DIAGNOSIS — R262 Difficulty in walking, not elsewhere classified: Secondary | ICD-10-CM | POA: Diagnosis not present

## 2017-04-19 ENCOUNTER — Ambulatory Visit (HOSPITAL_COMMUNITY): Payer: Self-pay | Admitting: Psychiatry

## 2017-04-24 DIAGNOSIS — M6259 Muscle wasting and atrophy, not elsewhere classified, multiple sites: Secondary | ICD-10-CM | POA: Diagnosis not present

## 2017-04-24 DIAGNOSIS — M25562 Pain in left knee: Secondary | ICD-10-CM | POA: Diagnosis not present

## 2017-04-24 DIAGNOSIS — M62552 Muscle wasting and atrophy, not elsewhere classified, left thigh: Secondary | ICD-10-CM | POA: Diagnosis not present

## 2017-04-24 DIAGNOSIS — R262 Difficulty in walking, not elsewhere classified: Secondary | ICD-10-CM | POA: Diagnosis not present

## 2017-04-25 DIAGNOSIS — M25562 Pain in left knee: Secondary | ICD-10-CM | POA: Diagnosis not present

## 2017-04-25 DIAGNOSIS — R262 Difficulty in walking, not elsewhere classified: Secondary | ICD-10-CM | POA: Diagnosis not present

## 2017-04-25 DIAGNOSIS — M62552 Muscle wasting and atrophy, not elsewhere classified, left thigh: Secondary | ICD-10-CM | POA: Diagnosis not present

## 2017-04-25 DIAGNOSIS — M6259 Muscle wasting and atrophy, not elsewhere classified, multiple sites: Secondary | ICD-10-CM | POA: Diagnosis not present

## 2017-04-27 DIAGNOSIS — R262 Difficulty in walking, not elsewhere classified: Secondary | ICD-10-CM | POA: Diagnosis not present

## 2017-04-27 DIAGNOSIS — M62552 Muscle wasting and atrophy, not elsewhere classified, left thigh: Secondary | ICD-10-CM | POA: Diagnosis not present

## 2017-04-27 DIAGNOSIS — M6259 Muscle wasting and atrophy, not elsewhere classified, multiple sites: Secondary | ICD-10-CM | POA: Diagnosis not present

## 2017-04-27 DIAGNOSIS — M25562 Pain in left knee: Secondary | ICD-10-CM | POA: Diagnosis not present

## 2017-05-01 DIAGNOSIS — M62552 Muscle wasting and atrophy, not elsewhere classified, left thigh: Secondary | ICD-10-CM | POA: Diagnosis not present

## 2017-05-01 DIAGNOSIS — R262 Difficulty in walking, not elsewhere classified: Secondary | ICD-10-CM | POA: Diagnosis not present

## 2017-05-01 DIAGNOSIS — M6259 Muscle wasting and atrophy, not elsewhere classified, multiple sites: Secondary | ICD-10-CM | POA: Diagnosis not present

## 2017-05-01 DIAGNOSIS — M25562 Pain in left knee: Secondary | ICD-10-CM | POA: Diagnosis not present

## 2017-05-02 DIAGNOSIS — M6259 Muscle wasting and atrophy, not elsewhere classified, multiple sites: Secondary | ICD-10-CM | POA: Diagnosis not present

## 2017-05-02 DIAGNOSIS — M62552 Muscle wasting and atrophy, not elsewhere classified, left thigh: Secondary | ICD-10-CM | POA: Diagnosis not present

## 2017-05-02 DIAGNOSIS — R262 Difficulty in walking, not elsewhere classified: Secondary | ICD-10-CM | POA: Diagnosis not present

## 2017-05-02 DIAGNOSIS — M25562 Pain in left knee: Secondary | ICD-10-CM | POA: Diagnosis not present

## 2017-05-04 DIAGNOSIS — M25562 Pain in left knee: Secondary | ICD-10-CM | POA: Diagnosis not present

## 2017-05-04 DIAGNOSIS — M1991 Primary osteoarthritis, unspecified site: Secondary | ICD-10-CM | POA: Diagnosis not present

## 2017-05-04 DIAGNOSIS — I10 Essential (primary) hypertension: Secondary | ICD-10-CM | POA: Diagnosis not present

## 2017-05-04 DIAGNOSIS — M6259 Muscle wasting and atrophy, not elsewhere classified, multiple sites: Secondary | ICD-10-CM | POA: Diagnosis not present

## 2017-05-04 DIAGNOSIS — E1129 Type 2 diabetes mellitus with other diabetic kidney complication: Secondary | ICD-10-CM | POA: Diagnosis not present

## 2017-05-04 DIAGNOSIS — M62552 Muscle wasting and atrophy, not elsewhere classified, left thigh: Secondary | ICD-10-CM | POA: Diagnosis not present

## 2017-05-04 DIAGNOSIS — R262 Difficulty in walking, not elsewhere classified: Secondary | ICD-10-CM | POA: Diagnosis not present

## 2017-05-15 DIAGNOSIS — M6259 Muscle wasting and atrophy, not elsewhere classified, multiple sites: Secondary | ICD-10-CM | POA: Diagnosis not present

## 2017-05-15 DIAGNOSIS — R262 Difficulty in walking, not elsewhere classified: Secondary | ICD-10-CM | POA: Diagnosis not present

## 2017-05-15 DIAGNOSIS — M62552 Muscle wasting and atrophy, not elsewhere classified, left thigh: Secondary | ICD-10-CM | POA: Diagnosis not present

## 2017-05-15 DIAGNOSIS — M25562 Pain in left knee: Secondary | ICD-10-CM | POA: Diagnosis not present

## 2017-05-16 DIAGNOSIS — M25562 Pain in left knee: Secondary | ICD-10-CM | POA: Diagnosis not present

## 2017-05-16 DIAGNOSIS — M6259 Muscle wasting and atrophy, not elsewhere classified, multiple sites: Secondary | ICD-10-CM | POA: Diagnosis not present

## 2017-05-16 DIAGNOSIS — M62552 Muscle wasting and atrophy, not elsewhere classified, left thigh: Secondary | ICD-10-CM | POA: Diagnosis not present

## 2017-05-16 DIAGNOSIS — R262 Difficulty in walking, not elsewhere classified: Secondary | ICD-10-CM | POA: Diagnosis not present

## 2017-05-18 DIAGNOSIS — M25562 Pain in left knee: Secondary | ICD-10-CM | POA: Diagnosis not present

## 2017-05-18 DIAGNOSIS — M6259 Muscle wasting and atrophy, not elsewhere classified, multiple sites: Secondary | ICD-10-CM | POA: Diagnosis not present

## 2017-05-18 DIAGNOSIS — R262 Difficulty in walking, not elsewhere classified: Secondary | ICD-10-CM | POA: Diagnosis not present

## 2017-05-18 DIAGNOSIS — M62552 Muscle wasting and atrophy, not elsewhere classified, left thigh: Secondary | ICD-10-CM | POA: Diagnosis not present

## 2017-05-22 DIAGNOSIS — M62552 Muscle wasting and atrophy, not elsewhere classified, left thigh: Secondary | ICD-10-CM | POA: Diagnosis not present

## 2017-05-22 DIAGNOSIS — R262 Difficulty in walking, not elsewhere classified: Secondary | ICD-10-CM | POA: Diagnosis not present

## 2017-05-22 DIAGNOSIS — M25562 Pain in left knee: Secondary | ICD-10-CM | POA: Diagnosis not present

## 2017-05-22 DIAGNOSIS — M6259 Muscle wasting and atrophy, not elsewhere classified, multiple sites: Secondary | ICD-10-CM | POA: Diagnosis not present

## 2017-05-23 DIAGNOSIS — M62552 Muscle wasting and atrophy, not elsewhere classified, left thigh: Secondary | ICD-10-CM | POA: Diagnosis not present

## 2017-05-23 DIAGNOSIS — R262 Difficulty in walking, not elsewhere classified: Secondary | ICD-10-CM | POA: Diagnosis not present

## 2017-05-23 DIAGNOSIS — M6259 Muscle wasting and atrophy, not elsewhere classified, multiple sites: Secondary | ICD-10-CM | POA: Diagnosis not present

## 2017-05-23 DIAGNOSIS — M25562 Pain in left knee: Secondary | ICD-10-CM | POA: Diagnosis not present

## 2017-05-25 DIAGNOSIS — R262 Difficulty in walking, not elsewhere classified: Secondary | ICD-10-CM | POA: Diagnosis not present

## 2017-05-25 DIAGNOSIS — M25562 Pain in left knee: Secondary | ICD-10-CM | POA: Diagnosis not present

## 2017-05-25 DIAGNOSIS — M62552 Muscle wasting and atrophy, not elsewhere classified, left thigh: Secondary | ICD-10-CM | POA: Diagnosis not present

## 2017-05-25 DIAGNOSIS — M6259 Muscle wasting and atrophy, not elsewhere classified, multiple sites: Secondary | ICD-10-CM | POA: Diagnosis not present

## 2017-06-02 ENCOUNTER — Encounter (INDEPENDENT_AMBULATORY_CARE_PROVIDER_SITE_OTHER): Payer: Self-pay | Admitting: Internal Medicine

## 2017-06-02 DIAGNOSIS — M62552 Muscle wasting and atrophy, not elsewhere classified, left thigh: Secondary | ICD-10-CM | POA: Diagnosis not present

## 2017-06-02 DIAGNOSIS — R262 Difficulty in walking, not elsewhere classified: Secondary | ICD-10-CM | POA: Diagnosis not present

## 2017-06-02 DIAGNOSIS — M6259 Muscle wasting and atrophy, not elsewhere classified, multiple sites: Secondary | ICD-10-CM | POA: Diagnosis not present

## 2017-06-02 DIAGNOSIS — M25562 Pain in left knee: Secondary | ICD-10-CM | POA: Diagnosis not present

## 2017-06-05 DIAGNOSIS — M25562 Pain in left knee: Secondary | ICD-10-CM | POA: Diagnosis not present

## 2017-06-05 DIAGNOSIS — M62552 Muscle wasting and atrophy, not elsewhere classified, left thigh: Secondary | ICD-10-CM | POA: Diagnosis not present

## 2017-06-05 DIAGNOSIS — M6259 Muscle wasting and atrophy, not elsewhere classified, multiple sites: Secondary | ICD-10-CM | POA: Diagnosis not present

## 2017-06-05 DIAGNOSIS — R262 Difficulty in walking, not elsewhere classified: Secondary | ICD-10-CM | POA: Diagnosis not present

## 2017-06-06 DIAGNOSIS — M6259 Muscle wasting and atrophy, not elsewhere classified, multiple sites: Secondary | ICD-10-CM | POA: Diagnosis not present

## 2017-06-06 DIAGNOSIS — M62552 Muscle wasting and atrophy, not elsewhere classified, left thigh: Secondary | ICD-10-CM | POA: Diagnosis not present

## 2017-06-06 DIAGNOSIS — R262 Difficulty in walking, not elsewhere classified: Secondary | ICD-10-CM | POA: Diagnosis not present

## 2017-06-06 DIAGNOSIS — M25562 Pain in left knee: Secondary | ICD-10-CM | POA: Diagnosis not present

## 2017-06-08 DIAGNOSIS — Z23 Encounter for immunization: Secondary | ICD-10-CM | POA: Diagnosis not present

## 2017-06-08 DIAGNOSIS — K589 Irritable bowel syndrome without diarrhea: Secondary | ICD-10-CM | POA: Diagnosis not present

## 2017-06-08 DIAGNOSIS — R5383 Other fatigue: Secondary | ICD-10-CM | POA: Diagnosis not present

## 2017-06-08 DIAGNOSIS — N951 Menopausal and female climacteric states: Secondary | ICD-10-CM | POA: Diagnosis not present

## 2017-06-08 DIAGNOSIS — G894 Chronic pain syndrome: Secondary | ICD-10-CM | POA: Diagnosis not present

## 2017-06-12 DIAGNOSIS — M62552 Muscle wasting and atrophy, not elsewhere classified, left thigh: Secondary | ICD-10-CM | POA: Diagnosis not present

## 2017-06-12 DIAGNOSIS — Z23 Encounter for immunization: Secondary | ICD-10-CM | POA: Diagnosis not present

## 2017-06-12 DIAGNOSIS — M25562 Pain in left knee: Secondary | ICD-10-CM | POA: Diagnosis not present

## 2017-06-12 DIAGNOSIS — N951 Menopausal and female climacteric states: Secondary | ICD-10-CM | POA: Diagnosis not present

## 2017-06-12 DIAGNOSIS — R262 Difficulty in walking, not elsewhere classified: Secondary | ICD-10-CM | POA: Diagnosis not present

## 2017-06-12 DIAGNOSIS — M6259 Muscle wasting and atrophy, not elsewhere classified, multiple sites: Secondary | ICD-10-CM | POA: Diagnosis not present

## 2017-06-14 DIAGNOSIS — M62552 Muscle wasting and atrophy, not elsewhere classified, left thigh: Secondary | ICD-10-CM | POA: Diagnosis not present

## 2017-06-14 DIAGNOSIS — R262 Difficulty in walking, not elsewhere classified: Secondary | ICD-10-CM | POA: Diagnosis not present

## 2017-06-14 DIAGNOSIS — M6259 Muscle wasting and atrophy, not elsewhere classified, multiple sites: Secondary | ICD-10-CM | POA: Diagnosis not present

## 2017-06-14 DIAGNOSIS — M25562 Pain in left knee: Secondary | ICD-10-CM | POA: Diagnosis not present

## 2017-06-20 DIAGNOSIS — E119 Type 2 diabetes mellitus without complications: Secondary | ICD-10-CM | POA: Diagnosis not present

## 2017-06-20 DIAGNOSIS — H5203 Hypermetropia, bilateral: Secondary | ICD-10-CM | POA: Diagnosis not present

## 2017-06-20 DIAGNOSIS — H2513 Age-related nuclear cataract, bilateral: Secondary | ICD-10-CM | POA: Diagnosis not present

## 2017-06-20 DIAGNOSIS — E1129 Type 2 diabetes mellitus with other diabetic kidney complication: Secondary | ICD-10-CM | POA: Diagnosis not present

## 2017-06-20 DIAGNOSIS — H524 Presbyopia: Secondary | ICD-10-CM | POA: Diagnosis not present

## 2017-06-21 DIAGNOSIS — M6259 Muscle wasting and atrophy, not elsewhere classified, multiple sites: Secondary | ICD-10-CM | POA: Diagnosis not present

## 2017-06-21 DIAGNOSIS — R262 Difficulty in walking, not elsewhere classified: Secondary | ICD-10-CM | POA: Diagnosis not present

## 2017-06-21 DIAGNOSIS — M25562 Pain in left knee: Secondary | ICD-10-CM | POA: Diagnosis not present

## 2017-06-21 DIAGNOSIS — M62552 Muscle wasting and atrophy, not elsewhere classified, left thigh: Secondary | ICD-10-CM | POA: Diagnosis not present

## 2017-06-28 ENCOUNTER — Ambulatory Visit (INDEPENDENT_AMBULATORY_CARE_PROVIDER_SITE_OTHER): Payer: 59 | Admitting: Psychiatry

## 2017-06-28 ENCOUNTER — Encounter (HOSPITAL_COMMUNITY): Payer: Self-pay | Admitting: Psychiatry

## 2017-06-28 VITALS — BP 153/88 | HR 89 | Ht 66.0 in | Wt 155.0 lb

## 2017-06-28 DIAGNOSIS — F411 Generalized anxiety disorder: Secondary | ICD-10-CM

## 2017-06-28 DIAGNOSIS — Z87891 Personal history of nicotine dependence: Secondary | ICD-10-CM | POA: Diagnosis not present

## 2017-06-28 DIAGNOSIS — Z811 Family history of alcohol abuse and dependence: Secondary | ICD-10-CM | POA: Diagnosis not present

## 2017-06-28 DIAGNOSIS — Z818 Family history of other mental and behavioral disorders: Secondary | ICD-10-CM | POA: Diagnosis not present

## 2017-06-28 DIAGNOSIS — M2550 Pain in unspecified joint: Secondary | ICD-10-CM | POA: Diagnosis not present

## 2017-06-28 DIAGNOSIS — R262 Difficulty in walking, not elsewhere classified: Secondary | ICD-10-CM | POA: Diagnosis not present

## 2017-06-28 DIAGNOSIS — F1021 Alcohol dependence, in remission: Secondary | ICD-10-CM

## 2017-06-28 DIAGNOSIS — R451 Restlessness and agitation: Secondary | ICD-10-CM

## 2017-06-28 DIAGNOSIS — M62552 Muscle wasting and atrophy, not elsewhere classified, left thigh: Secondary | ICD-10-CM | POA: Diagnosis not present

## 2017-06-28 DIAGNOSIS — M797 Fibromyalgia: Secondary | ICD-10-CM

## 2017-06-28 DIAGNOSIS — M6259 Muscle wasting and atrophy, not elsewhere classified, multiple sites: Secondary | ICD-10-CM | POA: Diagnosis not present

## 2017-06-28 DIAGNOSIS — M25562 Pain in left knee: Secondary | ICD-10-CM | POA: Diagnosis not present

## 2017-06-28 DIAGNOSIS — R450 Nervousness: Secondary | ICD-10-CM | POA: Diagnosis not present

## 2017-06-28 DIAGNOSIS — F330 Major depressive disorder, recurrent, mild: Secondary | ICD-10-CM

## 2017-06-28 DIAGNOSIS — Z636 Dependent relative needing care at home: Secondary | ICD-10-CM

## 2017-06-28 MED ORDER — LAMOTRIGINE 25 MG PO TABS: 50.0000 mg | ORAL_TABLET | Freq: Two times a day (BID) | ORAL | 2 refills | Status: DC

## 2017-06-28 MED ORDER — VENLAFAXINE HCL ER 75 MG PO CP24: 75.0000 mg | ORAL_CAPSULE | Freq: Two times a day (BID) | ORAL | 2 refills | Status: DC

## 2017-06-28 MED ORDER — LORAZEPAM 1 MG PO TABS
1.0000 mg | ORAL_TABLET | Freq: Three times a day (TID) | ORAL | 2 refills | Status: DC
Start: 2017-06-28 — End: 2017-12-19
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
## 2017-06-30 DIAGNOSIS — M62552 Muscle wasting and atrophy, not elsewhere classified, left thigh: Secondary | ICD-10-CM | POA: Diagnosis not present

## 2017-06-30 DIAGNOSIS — M25562 Pain in left knee: Secondary | ICD-10-CM | POA: Diagnosis not present

## 2017-06-30 DIAGNOSIS — M6259 Muscle wasting and atrophy, not elsewhere classified, multiple sites: Secondary | ICD-10-CM | POA: Diagnosis not present

## 2017-06-30 DIAGNOSIS — R262 Difficulty in walking, not elsewhere classified: Secondary | ICD-10-CM | POA: Diagnosis not present

## 2017-07-06 ENCOUNTER — Ambulatory Visit (INDEPENDENT_AMBULATORY_CARE_PROVIDER_SITE_OTHER): Payer: Self-pay | Admitting: Internal Medicine

## 2017-07-14 DIAGNOSIS — M25562 Pain in left knee: Secondary | ICD-10-CM | POA: Diagnosis not present

## 2017-07-14 DIAGNOSIS — M62552 Muscle wasting and atrophy, not elsewhere classified, left thigh: Secondary | ICD-10-CM | POA: Diagnosis not present

## 2017-07-14 DIAGNOSIS — R262 Difficulty in walking, not elsewhere classified: Secondary | ICD-10-CM | POA: Diagnosis not present

## 2017-07-14 DIAGNOSIS — M6259 Muscle wasting and atrophy, not elsewhere classified, multiple sites: Secondary | ICD-10-CM | POA: Diagnosis not present

## 2017-07-18 DIAGNOSIS — H2513 Age-related nuclear cataract, bilateral: Secondary | ICD-10-CM | POA: Diagnosis not present

## 2017-07-20 NOTE — Patient Instructions (Signed)
Your procedure is scheduled on: 08/01/2017   Report to [facility] at  800   AM.  Call this number if you have problems the morning of surgery: [phone]   Do not eat food or drink liquids :After Midnight.      Take these medicines the morning of surgery with A SIP OF WATER: ativan, oxycodone, zantac, effexor.   Do not wear jewelry, make-up or nail polish.  Do not wear lotions, powders, or perfumes. You may wear deodorant.  Do not shave 48 hours prior to surgery.  Do not bring valuables to the hospital.  Contacts, dentures or bridgework may not be worn into surgery.  Leave suitcase in the car. After surgery it may be brought to your room.  For patients admitted to the hospital, checkout time is 11:00 AM the day of discharge.   Patients discharged the day of surgery will not be allowed to drive home.  :     Please read over the following fact sheets that you were given: Coughing and Deep Breathing, Surgical Site Infection Prevention, Anesthesia Post-op Instructions and Care and Recovery After Surgery    Cataract A cataract is a clouding of the lens of the eye. When a lens becomes cloudy, vision is reduced based on the degree and nature of the clouding. Many cataracts reduce vision to some degree. Some cataracts make people more near-sighted as they develop. Other cataracts increase glare. Cataracts that are ignored and become worse can sometimes look white. The white color can be seen through the pupil. CAUSES   Aging. However, cataracts may occur at any age, even in newborns.   Certain drugs.   Trauma to the eye.   Certain diseases such as diabetes.   Specific eye diseases such as chronic inflammation inside the eye or a sudden attack of a rare form of glaucoma.   Inherited or acquired medical problems.  SYMPTOMS   Gradual, progressive drop in vision in the affected eye.   Severe, rapid visual loss. This most often happens when trauma is the cause.  DIAGNOSIS  To detect a  cataract, an eye doctor examines the lens. Cataracts are best diagnosed with an exam of the eyes with the pupils enlarged (dilated) by drops.  TREATMENT  For an early cataract, vision may improve by using different eyeglasses or stronger lighting. If that does not help your vision, surgery is the only effective treatment. A cataract needs to be surgically removed when vision loss interferes with your everyday activities, such as driving, reading, or watching TV. A cataract may also have to be removed if it prevents examination or treatment of another eye problem. Surgery removes the cloudy lens and usually replaces it with a substitute lens (intraocular lens, IOL).  At a time when both you and your doctor agree, the cataract will be surgically removed. If you have cataracts in both eyes, only one is usually removed at a time. This allows the operated eye to heal and be out of danger from any possible problems after surgery (such as infection or poor wound healing). In rare cases, a cataract may be doing damage to your eye. In these cases, your caregiver may advise surgical removal right away. The vast majority of people who have cataract surgery have better vision afterward. HOME CARE INSTRUCTIONS  If you are not planning surgery, you may be asked to do the following:  Use different eyeglasses.   Use stronger or brighter lighting.   Ask your eye doctor about  reducing your medicine dose or changing medicines if it is thought that a medicine caused your cataract. Changing medicines does not make the cataract go away on its own.   Become familiar with your surroundings. Poor vision can lead to injury. Avoid bumping into things on the affected side. You are at a higher risk for tripping or falling.   Exercise extreme care when driving or operating machinery.   Wear sunglasses if you are sensitive to bright light or experiencing problems with glare.  SEEK IMMEDIATE MEDICAL CARE IF:   You have a  worsening or sudden vision loss.   You notice redness, swelling, or increasing pain in the eye.   You have a fever.  Document Released: 08/22/2005 Document Revised: 08/11/2011 Document Reviewed: 04/15/2011 [facility] Patient Information 2012 [place].PATIENT INSTRUCTIONS POST-ANESTHESIA  IMMEDIATELY FOLLOWING SURGERY:  Do not drive or operate machinery for the first twenty four hours after surgery.  Do not make any important decisions for twenty four hours after surgery or while taking narcotic pain medications or sedatives.  If you develop intractable nausea and vomiting or a severe headache please notify your doctor immediately.  FOLLOW-UP:  Please make an appointment with your surgeon as instructed. You do not need to follow up with anesthesia unless specifically instructed to do so.  WOUND CARE INSTRUCTIONS (if applicable):  Keep a dry clean dressing on the anesthesia/puncture wound site if there is drainage.  Once the wound has quit draining you may leave it open to air.  Generally you should leave the bandage intact for twenty four hours unless there is drainage.  If the epidural site drains for more than 36-48 hours please call the anesthesia department.  QUESTIONS?:  Please feel free to call your physician or the hospital operator if you have any questions, and they will be happy to assist you.

## 2017-07-21 DIAGNOSIS — L719 Rosacea, unspecified: Secondary | ICD-10-CM | POA: Diagnosis not present

## 2017-07-21 DIAGNOSIS — L739 Follicular disorder, unspecified: Secondary | ICD-10-CM | POA: Diagnosis not present

## 2017-07-21 DIAGNOSIS — I10 Essential (primary) hypertension: Secondary | ICD-10-CM | POA: Diagnosis not present

## 2017-07-24 ENCOUNTER — Inpatient Hospital Stay (HOSPITAL_COMMUNITY)
Admission: RE | Admit: 2017-07-24 | Discharge: 2017-07-24 | Disposition: A | Discharge status: Home / Self Care | Payer: Self-pay | Source: Ambulatory Visit

## 2017-07-25 ENCOUNTER — Encounter (HOSPITAL_COMMUNITY)
Admission: RE | Admit: 2017-07-25 | Discharge: 2017-07-25 | Disposition: A | Discharge status: Home / Self Care | Payer: Medicare Other | Source: Ambulatory Visit | Attending: Ophthalmology | Admitting: Ophthalmology

## 2017-07-25 ENCOUNTER — Other Ambulatory Visit: Payer: Self-pay

## 2017-07-25 ENCOUNTER — Encounter (HOSPITAL_COMMUNITY): Payer: Self-pay

## 2017-07-25 DIAGNOSIS — H269 Unspecified cataract: Secondary | ICD-10-CM | POA: Diagnosis not present

## 2017-07-25 DIAGNOSIS — Z01818 Encounter for other preprocedural examination: Secondary | ICD-10-CM | POA: Diagnosis not present

## 2017-07-25 LAB — CBC
HEMATOCRIT: 43.9 % (ref 36.0–46.0)
HEMOGLOBIN: 13.4 g/dL (ref 12.0–15.0)
MCH: 28.2 pg (ref 26.0–34.0)
MCHC: 30.5 g/dL (ref 30.0–36.0)
MCV: 92.2 fL (ref 78.0–100.0)
Platelets: 203 10*3/uL (ref 150–400)
RBC: 4.76 MIL/uL (ref 3.87–5.11)
RDW: 13.2 % (ref 11.5–15.5)
WBC: 5.8 10*3/uL (ref 4.0–10.5)

## 2017-07-25 LAB — BASIC METABOLIC PANEL
ANION GAP: 9 (ref 5–15)
BUN: 18 mg/dL (ref 6–20)
CHLORIDE: 103 mmol/L (ref 101–111)
CO2: 26 mmol/L (ref 22–32)
Calcium: 9.5 mg/dL (ref 8.9–10.3)
Creatinine, Ser: 0.68 mg/dL (ref 0.44–1.00)
GFR calc Af Amer: >60 mL/min (ref >60)
GLUCOSE: 118 mg/dL — AB (ref 65–99)
POTASSIUM: 4.2 mmol/L (ref 3.5–5.1)
Sodium: 138 mmol/L (ref 135–145)

## 2017-07-26 ENCOUNTER — Ambulatory Visit (INDEPENDENT_AMBULATORY_CARE_PROVIDER_SITE_OTHER): Payer: 59 | Admitting: Psychiatry

## 2017-07-26 DIAGNOSIS — F330 Major depressive disorder, recurrent, mild: Secondary | ICD-10-CM

## 2017-07-26 DIAGNOSIS — F329 Major depressive disorder, single episode, unspecified: Secondary | ICD-10-CM | POA: Diagnosis not present

## 2017-07-26 DIAGNOSIS — Z636 Dependent relative needing care at home: Secondary | ICD-10-CM | POA: Diagnosis not present
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
## 2017-08-01 ENCOUNTER — Ambulatory Visit (HOSPITAL_COMMUNITY): Payer: Medicare Other | Admitting: Anesthesiology

## 2017-08-01 ENCOUNTER — Encounter (HOSPITAL_COMMUNITY): Payer: Self-pay | Admitting: *Deleted

## 2017-08-01 ENCOUNTER — Ambulatory Visit (HOSPITAL_COMMUNITY)
Admission: RE | Admit: 2017-08-01 | Discharge: 2017-08-01 | Disposition: A | Discharge status: Home / Self Care | Payer: Medicare Other | Source: Ambulatory Visit | Attending: Ophthalmology | Admitting: Ophthalmology

## 2017-08-01 ENCOUNTER — Encounter (HOSPITAL_COMMUNITY)
Admission: RE | Disposition: A | Discharge status: Home / Self Care | Payer: Self-pay | Source: Ambulatory Visit | Attending: Ophthalmology

## 2017-08-01 DIAGNOSIS — K219 Gastro-esophageal reflux disease without esophagitis: Secondary | ICD-10-CM | POA: Insufficient documentation

## 2017-08-01 DIAGNOSIS — Z87891 Personal history of nicotine dependence: Secondary | ICD-10-CM | POA: Insufficient documentation

## 2017-08-01 DIAGNOSIS — F419 Anxiety disorder, unspecified: Secondary | ICD-10-CM | POA: Insufficient documentation

## 2017-08-01 DIAGNOSIS — H2511 Age-related nuclear cataract, right eye: Secondary | ICD-10-CM | POA: Insufficient documentation

## 2017-08-01 DIAGNOSIS — J449 Chronic obstructive pulmonary disease, unspecified: Secondary | ICD-10-CM | POA: Insufficient documentation

## 2017-08-01 DIAGNOSIS — M797 Fibromyalgia: Secondary | ICD-10-CM | POA: Diagnosis not present

## 2017-08-01 DIAGNOSIS — Z79899 Other long term (current) drug therapy: Secondary | ICD-10-CM | POA: Insufficient documentation

## 2017-08-01 DIAGNOSIS — F329 Major depressive disorder, single episode, unspecified: Secondary | ICD-10-CM | POA: Insufficient documentation

## 2017-08-01 DIAGNOSIS — M1990 Unspecified osteoarthritis, unspecified site: Secondary | ICD-10-CM | POA: Insufficient documentation

## 2017-08-01 DIAGNOSIS — H2512 Age-related nuclear cataract, left eye: Secondary | ICD-10-CM | POA: Diagnosis not present

## 2017-08-01 HISTORY — PX: CATARACT EXTRACTION W/PHACO: SHX586

## 2017-08-01 SURGERY — PHACOEMULSIFICATION, CATARACT, WITH IOL INSERTION
Anesthesia: Monitor Anesthesia Care | Site: Eye | Laterality: Right

## 2017-08-01 MED ORDER — LACTATED RINGERS IV SOLN
INTRAVENOUS | Status: DC | PRN
  Administered 2017-08-01: 09:00:00 via INTRAVENOUS

## 2017-08-01 MED ORDER — MIDAZOLAM HCL 2 MG/2ML IJ SOLN
1.0000 mg | INTRAMUSCULAR | Status: AC
  Administered 2017-08-01: 2 mg via INTRAVENOUS

## 2017-08-01 MED ORDER — FENTANYL CITRATE (PF) 100 MCG/2ML IJ SOLN
INTRAMUSCULAR | Status: AC
  Filled 2017-08-01: qty 2

## 2017-08-01 MED ORDER — CYCLOPENTOLATE-PHENYLEPHRINE 0.2-1 % OP SOLN
1.0000 [drp] | OPHTHALMIC | Status: AC
  Administered 2017-08-01 (×3): 1 [drp] via OPHTHALMIC

## 2017-08-01 MED ORDER — BSS IO SOLN
INTRAOCULAR | Status: DC | PRN
  Administered 2017-08-01: 15 mL via INTRAOCULAR

## 2017-08-01 MED ORDER — LACTATED RINGERS IV SOLN
INTRAVENOUS | Status: DC
  Administered 2017-08-01: 1000 mL via INTRAVENOUS

## 2017-08-01 MED ORDER — TETRACAINE HCL 0.5 % OP SOLN
1.0000 [drp] | OPHTHALMIC | Status: AC
  Administered 2017-08-01 (×3): 1 [drp] via OPHTHALMIC

## 2017-08-01 MED ORDER — PROVISC 10 MG/ML IO SOLN
INTRAOCULAR | Status: DC | PRN
  Administered 2017-08-01: 0.85 mL via INTRAOCULAR

## 2017-08-01 MED ORDER — FENTANYL CITRATE (PF) 100 MCG/2ML IJ SOLN
25.0000 ug | Freq: Once | INTRAMUSCULAR | Status: AC
  Administered 2017-08-01: 25 ug via INTRAVENOUS

## 2017-08-01 MED ORDER — MIDAZOLAM HCL 2 MG/2ML IJ SOLN
INTRAMUSCULAR | Status: AC
  Filled 2017-08-01: qty 2

## 2017-08-01 MED ORDER — PHENYLEPHRINE HCL 2.5 % OP SOLN
1.0000 [drp] | OPHTHALMIC | Status: AC | PRN
  Administered 2017-08-01 (×3): 1 [drp] via OPHTHALMIC

## 2017-08-01 MED ORDER — BSS IO SOLN
INTRAOCULAR | Status: DC | PRN
  Administered 2017-08-01: 500 mL

## 2017-08-01 MED ORDER — KETOROLAC TROMETHAMINE 0.5 % OP SOLN
1.0000 [drp] | OPHTHALMIC | Status: AC
  Administered 2017-08-01 (×3): 1 [drp] via OPHTHALMIC

## 2017-08-01 SURGICAL SUPPLY — 10 items
CLOTH BEACON ORANGE TIMEOUT ST (SAFETY) ×2 IMPLANT
EYE SHIELD UNIVERSAL CLEAR (GAUZE/BANDAGES/DRESSINGS) ×2 IMPLANT
GLOVE BIOGEL PI IND STRL 6.5 (GLOVE) ×2 IMPLANT
GLOVE BIOGEL PI IND STRL 7.5 (GLOVE) ×1 IMPLANT
GLOVE BIOGEL PI INDICATOR 6.5 (GLOVE) ×2
GLOVE BIOGEL PI INDICATOR 7.5 (GLOVE) ×1
LENS ALC ACRYL/TECN (Ophthalmic Related) ×2 IMPLANT
PAD ARMBOARD 7.5X6 YLW CONV (MISCELLANEOUS) ×2 IMPLANT
TAPE CLOTH SURG 4X10 WHT LF (GAUZE/BANDAGES/DRESSINGS) ×2 IMPLANT
WATER STERILE IRR 250ML POUR (IV SOLUTION) ×2 IMPLANT

## 2017-08-01 NOTE — Anesthesia Preprocedure Evaluation (Signed)
Anesthesia Evaluation  Patient identified by MRN, date of birth, ID band Patient awake    Reviewed: Allergy & Precautions, H&P , NPO status , Patient's Chart, lab work & pertinent test results  Airway Mallampati: II  TM Distance: >3 FB Neck ROM: full    Dental no notable dental hx. (+) Dental Advisory Given, Teeth Intact   Pulmonary shortness of breath and with exertion, COPD,  COPD inhaler, former smoker,    Pulmonary exam normal breath sounds clear to auscultation       Cardiovascular Exercise Tolerance: Good negative cardio ROS Normal cardiovascular exam Rhythm:regular Rate:Normal     Neuro/Psych PSYCHIATRIC DISORDERS Anxiety Depression    GI/Hepatic GERD  Medicated and Controlled,(+) Hepatitis -, C  Endo/Other  negative endocrine ROS  Renal/GU negative Renal ROS  negative genitourinary   Musculoskeletal  (+) Arthritis , Osteoarthritis,  Fibromyalgia -  Abdominal   Peds  Hematology negative hematology ROS (+)   Anesthesia Other Findings   Reproductive/Obstetrics                             Anesthesia Physical Anesthesia Plan  ASA: III  Anesthesia Plan: MAC   Post-op Pain Management:    Induction: Intravenous  PONV Risk Score and Plan:   Airway Management Planned: Nasal Cannula  Additional Equipment:   Intra-op Plan:   Post-operative Plan:   Informed Consent: I have reviewed the patients History and Physical, chart, labs and discussed the procedure including the risks, benefits and alternatives for the proposed anesthesia with the patient or authorized representative who has indicated his/her understanding and acceptance.     Plan Discussed with:   Anesthesia Plan Comments:         Anesthesia Quick Evaluation

## 2017-08-01 NOTE — H&P (Signed)
The patient was re examined and there is no change in the patients condition since the original H and P. 

## 2017-08-01 NOTE — Progress Notes (Signed)
Removed eye incision in error.

## 2017-08-01 NOTE — Transfer of Care (Signed)
Immediate Anesthesia Transfer of Care Note  Patient: Lauren [name]  Procedure(s) Performed: CATARACT EXTRACTION PHACO AND INTRAOCULAR LENS PLACEMENT RIGHT EYE (Right Eye)  Patient Location: Short Stay  Anesthesia Type:MAC  Level of Consciousness: awake, alert , oriented and patient cooperative  Airway & Oxygen Therapy: Patient Spontanous Breathing  Post-op Assessment: Report given to RN and Post -op Vital signs reviewed and stable  Post vital signs: Reviewed and stable  Last Vitals:  Vitals:   08/01/17 0915 08/01/17 0920  BP: 121/79 129/77  Pulse:    Resp: 15 15  Temp:    SpO2: 95% 94%    Last Pain:  Vitals:   08/01/17 0827  TempSrc: Oral  PainSc: 0-No pain      Patients Stated Pain Goal: 7 (16/10/96 0454)  Complications: No apparent anesthesia complications

## 2017-08-01 NOTE — Anesthesia Postprocedure Evaluation (Signed)
Anesthesia Post Note  Patient: Lauren [name]  Procedure(s) Performed: CATARACT EXTRACTION PHACO AND INTRAOCULAR LENS PLACEMENT RIGHT EYE (Right Eye)  Patient location during evaluation: Short Stay Anesthesia Type: MAC Level of consciousness: awake and alert, oriented and patient cooperative Pain management: pain level controlled Vital Signs Assessment: post-procedure vital signs reviewed and stable Respiratory status: spontaneous breathing and respiratory function stable Cardiovascular status: stable Postop Assessment: no apparent nausea or vomiting Anesthetic complications: no     Last Vitals:  Vitals:   08/01/17 0915 08/01/17 0920  BP: 121/79 129/77  Pulse:    Resp: 15 15  Temp:    SpO2: 95% 94%    Last Pain:  Vitals:   08/01/17 0827  TempSrc: Oral  PainSc: 0-No pain                 Darlene Brozowski A

## 2017-08-01 NOTE — Discharge Instructions (Signed)
[facility] Instructions [place] [address]      1. Avoid closing eyes tightly. One often closes the eye tightly when laughing, talking, sneezing, coughing or if they feel irritated. At these times, you should be careful not to close your eyes tightly.  2. Instill eye drops as instructed. To instill drops in your eye, open it, look up and have someone gently pull the lower lid down and instill a couple of drops inside the lower lid.  3. Do not touch upper lid.  4. Take Advil or Tylenol for pain.  5. You may use either eye for near work, such as reading or sewing and you may watch television.  6. You may have your hair done at the beauty parlor at any time.  7. Wear dark glasses with or without your own glasses if you are in bright light.  8. Call our office at [phone] or [phone] if you have sharp pain in your eye or unusual symptoms.  9.  FOLLOW UP WITH DR. SHAPIRO TODAY IN HIS [place] OFFICE AT 3:30 pm.    I have received a copy of the above instructions and will follow them.     IF YOU ARE IN IMMEDIATE DANGER CALL 911!  It is important for you to keep your follow-up appointment with your physician after discharge, OR, for you /your caregiver to make a follow-up appointment with your physician / medical provider after discharge.  Show these instructions to the next healthcare provider you see.   Monitored Anesthesia Care, Care After These instructions provide you with information about caring for yourself after your procedure. Your health care provider may also give you more specific instructions. Your treatment has been planned according to current medical practices, but problems sometimes occur. Call your health care provider if you have any problems or questions after your procedure. What can I expect after the procedure? After your procedure, it is common to:  Feel sleepy for several  hours.  Feel clumsy and have poor balance for several hours.  Feel forgetful about what happened after the procedure.  Have poor judgment for several hours.  Feel nauseous or vomit.  Have a sore throat if you had a breathing tube during the procedure.  Follow these instructions at home: For at least 24 hours after the procedure:   Do not: ? Participate in activities in which you could fall or become injured. ? Drive. ? Use heavy machinery. ? Drink alcohol. ? Take sleeping pills or medicines that cause drowsiness. ? Make important decisions or sign legal documents. ? Take care of children on your own.  Rest. Eating and drinking  Follow the diet that is recommended by your health care provider.  If you vomit, drink water, juice, or soup when you can drink without vomiting.  Make sure you have little or no nausea before eating solid foods. General instructions  Have a responsible adult stay with you until you are awake and alert.  Take over-the-counter and prescription medicines only as told by your health care provider.  If you smoke, do not smoke without supervision.  Keep all follow-up visits as told by your health care provider. This is important. Contact a health care provider if:  You keep feeling nauseous or you keep vomiting.  You feel light-headed.  You develop a rash.  You have a fever. Get help right away if:  You have trouble  breathing. This information is not intended to replace advice given to you by your health care provider. Make sure you discuss any questions you have with your health care provider. Document Released: 12/13/2015 Document Revised: 04/13/2016 Document Reviewed: 12/13/2015 Elsevier Interactive Patient Education  [employer].

## 2017-08-01 NOTE — Op Note (Signed)
Patient brought to the operating room and prepped and draped in the usual manner.  Lid speculum inserted in right eye.  Stab incision made at the twelve o'clock position.  Provisc instilled in the anterior chamber.   A 2.4 mm. Stab incision was made temporally.  An anterior capsulotomy was done with a bent 25 gauge needle.  The nucleus was hydrodissected.  The Phaco tip was inserted in the anterior chamber and the nucleus was emulsified.  CDE was 3.30.  The cortical material was then removed with the I and A tip.  Posterior capsule was the polished.  The anterior chamber was deepened with Provisc.  A 22.0 Senatobia was then inserted in the capsular bag.  Provisc was then removed with the I and A tip.  The wound was then hydrated.  Patient sent to the Recovery Room in good condition with follow up in my office.  Preoperative Diagnosis:  Nuclear Cataract OD Postoperative Diagnosis:  Same Procedure name: Kelman Phacoemulsification OD with IOL

## 2017-08-01 NOTE — Anesthesia Procedure Notes (Signed)
Procedure Name: MAC Date/Time: 08/01/2017 9:22 AM Performed by: Andree Elk Taje Tondreau A, CRNA Pre-anesthesia Checklist: Patient identified, Emergency Drugs available, Suction available, Patient being monitored and Timeout performed Oxygen Delivery Method: Nasal cannula

## 2017-08-02 ENCOUNTER — Encounter (HOSPITAL_COMMUNITY): Payer: Self-pay | Admitting: Ophthalmology

## 2017-08-08 ENCOUNTER — Other Ambulatory Visit (HOSPITAL_COMMUNITY): Payer: Self-pay | Admitting: Psychiatry

## 2017-08-09 ENCOUNTER — Ambulatory Visit (HOSPITAL_COMMUNITY): Payer: Self-pay | Admitting: Psychiatry

## 2017-08-10 ENCOUNTER — Encounter (HOSPITAL_COMMUNITY)
Admission: RE | Admit: 2017-08-10 | Discharge: 2017-08-10 | Disposition: A | Discharge status: Home / Self Care | Payer: Medicare Other | Source: Ambulatory Visit | Attending: Ophthalmology | Admitting: Ophthalmology

## 2017-08-10 ENCOUNTER — Encounter (HOSPITAL_COMMUNITY): Payer: Self-pay

## 2017-08-15 ENCOUNTER — Encounter (HOSPITAL_COMMUNITY): Payer: Self-pay

## 2017-08-15 ENCOUNTER — Encounter (HOSPITAL_COMMUNITY)
Admission: RE | Disposition: A | Discharge status: Home / Self Care | Payer: Self-pay | Source: Ambulatory Visit | Attending: Ophthalmology

## 2017-08-15 ENCOUNTER — Ambulatory Visit (HOSPITAL_COMMUNITY)
Admission: RE | Admit: 2017-08-15 | Discharge: 2017-08-15 | Disposition: A | Discharge status: Home / Self Care | Payer: Medicare Other | Source: Ambulatory Visit | Attending: Ophthalmology | Admitting: Ophthalmology

## 2017-08-15 ENCOUNTER — Ambulatory Visit (HOSPITAL_COMMUNITY): Payer: Medicare Other | Admitting: Anesthesiology

## 2017-08-15 ENCOUNTER — Other Ambulatory Visit: Payer: Self-pay

## 2017-08-15 DIAGNOSIS — K219 Gastro-esophageal reflux disease without esophagitis: Secondary | ICD-10-CM | POA: Insufficient documentation

## 2017-08-15 DIAGNOSIS — M797 Fibromyalgia: Secondary | ICD-10-CM | POA: Diagnosis not present

## 2017-08-15 DIAGNOSIS — H2512 Age-related nuclear cataract, left eye: Secondary | ICD-10-CM | POA: Diagnosis not present

## 2017-08-15 DIAGNOSIS — F419 Anxiety disorder, unspecified: Secondary | ICD-10-CM | POA: Insufficient documentation

## 2017-08-15 DIAGNOSIS — Z87891 Personal history of nicotine dependence: Secondary | ICD-10-CM | POA: Diagnosis not present

## 2017-08-15 DIAGNOSIS — J449 Chronic obstructive pulmonary disease, unspecified: Secondary | ICD-10-CM | POA: Diagnosis not present

## 2017-08-15 DIAGNOSIS — M1990 Unspecified osteoarthritis, unspecified site: Secondary | ICD-10-CM | POA: Diagnosis not present

## 2017-08-15 DIAGNOSIS — F329 Major depressive disorder, single episode, unspecified: Secondary | ICD-10-CM | POA: Diagnosis not present

## 2017-08-15 DIAGNOSIS — Z79899 Other long term (current) drug therapy: Secondary | ICD-10-CM | POA: Diagnosis not present

## 2017-08-15 HISTORY — PX: CATARACT EXTRACTION W/PHACO: SHX586

## 2017-08-15 SURGERY — PHACOEMULSIFICATION, CATARACT, WITH IOL INSERTION
Anesthesia: Monitor Anesthesia Care | Site: Eye | Laterality: Left

## 2017-08-15 MED ORDER — TETRACAINE 0.5 % OP SOLN OPTIME - NO CHARGE
OPHTHALMIC | Status: DC | PRN
  Administered 2017-08-15: 2 [drp] via OPHTHALMIC

## 2017-08-15 MED ORDER — PHENYLEPHRINE HCL 2.5 % OP SOLN
1.0000 [drp] | OPHTHALMIC | Status: AC
  Administered 2017-08-15 (×3): 1 [drp] via OPHTHALMIC

## 2017-08-15 MED ORDER — PROVISC 10 MG/ML IO SOLN
INTRAOCULAR | Status: DC | PRN
  Administered 2017-08-15: 0.85 mL via INTRAOCULAR

## 2017-08-15 MED ORDER — CYCLOPENTOLATE-PHENYLEPHRINE 0.2-1 % OP SOLN
1.0000 [drp] | OPHTHALMIC | Status: AC
  Administered 2017-08-15 (×3): 1 [drp] via OPHTHALMIC

## 2017-08-15 MED ORDER — LACTATED RINGERS IV SOLN
INTRAVENOUS | Status: DC
  Administered 2017-08-15: 09:00:00 via INTRAVENOUS

## 2017-08-15 MED ORDER — FENTANYL CITRATE (PF) 100 MCG/2ML IJ SOLN
25.0000 ug | Freq: Once | INTRAMUSCULAR | Status: AC
  Administered 2017-08-15: 25 ug via INTRAVENOUS
  Filled 2017-08-15: qty 2

## 2017-08-15 MED ORDER — TETRACAINE HCL 0.5 % OP SOLN
1.0000 [drp] | OPHTHALMIC | Status: AC
  Administered 2017-08-15 (×3): 1 [drp] via OPHTHALMIC

## 2017-08-15 MED ORDER — KETOROLAC TROMETHAMINE 0.5 % OP SOLN
1.0000 [drp] | OPHTHALMIC | Status: AC
  Administered 2017-08-15 (×3): 1 [drp] via OPHTHALMIC

## 2017-08-15 MED ORDER — MIDAZOLAM HCL 2 MG/2ML IJ SOLN
1.0000 mg | INTRAMUSCULAR | Status: AC
  Administered 2017-08-15 (×2): 1 mg via INTRAVENOUS
  Filled 2017-08-15 (×2): qty 2

## 2017-08-15 MED ORDER — BSS IO SOLN
INTRAOCULAR | Status: DC | PRN
  Administered 2017-08-15: 15 mL

## 2017-08-15 MED ORDER — EPINEPHRINE PF 1 MG/ML IJ SOLN
INTRAMUSCULAR | Status: DC | PRN
  Administered 2017-08-15: 500 mL

## 2017-08-15 SURGICAL SUPPLY — 11 items
CLOTH BEACON ORANGE TIMEOUT ST (SAFETY) ×2 IMPLANT
EYE SHIELD UNIVERSAL CLEAR (GAUZE/BANDAGES/DRESSINGS) ×2 IMPLANT
GLOVE BIOGEL PI IND STRL 7.0 (GLOVE) ×2 IMPLANT
GLOVE BIOGEL PI IND STRL 7.5 (GLOVE) ×1 IMPLANT
GLOVE BIOGEL PI INDICATOR 7.0 (GLOVE) ×2
GLOVE BIOGEL PI INDICATOR 7.5 (GLOVE) ×1
LENS ALC ACRYL/TECN (Ophthalmic Related) ×2 IMPLANT
PAD ARMBOARD 7.5X6 YLW CONV (MISCELLANEOUS) ×2 IMPLANT
RING MALYGIN (MISCELLANEOUS) IMPLANT
TAPE PAPER 1X10 WHT MICROPORE (GAUZE/BANDAGES/DRESSINGS) ×2 IMPLANT
WATER STERILE IRR 250ML POUR (IV SOLUTION) ×2 IMPLANT

## 2017-08-15 NOTE — Op Note (Signed)
Patient brought to the operating room and prepped and draped in the usual manner.  Lid speculum inserted in left eye.  Stab incision made at the twelve o'clock position.  Provisc instilled in the anterior chamber.   A 2.4 mm. Stab incision was made temporally.  An anterior capsulotomy was done with a bent 25 gauge needle.  The nucleus was hydrodissected.  The Phaco tip was inserted in the anterior chamber and the nucleus was emulsified.  CDE was 4.00.  The cortical material was then removed with the I and A tip.  Posterior capsule was the polished.  The anterior chamber was deepened with Provisc.  A 21.5 Diopter Alcon [id number] IOL was then inserted in the capsular bag.  Provisc was then removed with the I and A tip.  The wound was then hydrated.  Patient sent to the Recovery Room in good condition with follow up in my office.  Preoperative Diagnosis:  Nuclear Cataract OS Postoperative Diagnosis:  Same Procedure name: Kelman Phacoemulsification OS with IOL

## 2017-08-15 NOTE — Anesthesia Postprocedure Evaluation (Signed)
Anesthesia Post Note  Patient: Lauren [name]  Procedure(s) Performed: CATARACT EXTRACTION PHACO AND INTRAOCULAR LENS PLACEMENT ([place]) (Left Eye)  Patient location during evaluation: Short Stay Anesthesia Type: MAC Level of consciousness: awake and alert and patient cooperative Pain management: satisfactory to patient Respiratory status: spontaneous breathing Cardiovascular status: stable Postop Assessment: no apparent nausea or vomiting Anesthetic complications: no     Last Vitals:  Vitals:   08/15/17 0930 08/15/17 0935  BP:  115/67  Pulse:    Resp: 18 (!) 29  Temp:    SpO2: 92% 93%    Last Pain:  Vitals:   08/15/17 0900  TempSrc: Oral  PainSc: 0-No pain                 Jedrick Hutcherson

## 2017-08-15 NOTE — H&P (Signed)
The patient was re examined and there is no change in the patients condition since the original H and P. 

## 2017-08-15 NOTE — Transfer of Care (Signed)
Immediate Anesthesia Transfer of Care Note  Patient: Lauren [name]  Procedure(s) Performed: CATARACT EXTRACTION PHACO AND INTRAOCULAR LENS PLACEMENT ([place]) (Left Eye)  Patient Location: Short Stay  Anesthesia Type:MAC  Level of Consciousness: awake, alert  and patient cooperative  Airway & Oxygen Therapy: Patient Spontanous Breathing  Post-op Assessment: Report given to RN and Post -op Vital signs reviewed and stable  Post vital signs: Reviewed and stable  Last Vitals:  Vitals:   08/15/17 0930 08/15/17 0935  BP:  115/67  Pulse:    Resp: 18 (!) 29  Temp:    SpO2: 92% 93%    Last Pain:  Vitals:   08/15/17 0900  TempSrc: Oral  PainSc: 0-No pain         Complications: No apparent anesthesia complications

## 2017-08-15 NOTE — Anesthesia Preprocedure Evaluation (Signed)
Anesthesia Evaluation  Patient identified by MRN, date of birth, ID band Patient awake    Reviewed: Allergy & Precautions, H&P , NPO status , Patient's Chart, lab work & pertinent test results  Airway Mallampati: II  TM Distance: >3 FB Neck ROM: full    Dental no notable dental hx. (+) Dental Advisory Given, Teeth Intact   Pulmonary shortness of breath and with exertion, COPD,  COPD inhaler, former smoker,    Pulmonary exam normal breath sounds clear to auscultation       Cardiovascular Exercise Tolerance: Good negative cardio ROS Normal cardiovascular exam Rhythm:regular Rate:Normal     Neuro/Psych PSYCHIATRIC DISORDERS Anxiety Depression    GI/Hepatic GERD  Medicated and Controlled,(+) Hepatitis -, C  Endo/Other  negative endocrine ROS  Renal/GU negative Renal ROS  negative genitourinary   Musculoskeletal  (+) Arthritis , Osteoarthritis,  Fibromyalgia -  Abdominal   Peds  Hematology negative hematology ROS (+)   Anesthesia Other Findings   Reproductive/Obstetrics                             Anesthesia Physical Anesthesia Plan  ASA: III  Anesthesia Plan: MAC   Post-op Pain Management:    Induction: Intravenous  PONV Risk Score and Plan:   Airway Management Planned: Nasal Cannula  Additional Equipment:   Intra-op Plan:   Post-operative Plan:   Informed Consent: I have reviewed the patients History and Physical, chart, labs and discussed the procedure including the risks, benefits and alternatives for the proposed anesthesia with the patient or authorized representative who has indicated his/her understanding and acceptance.     Plan Discussed with:   Anesthesia Plan Comments:         Anesthesia Quick Evaluation  

## 2017-08-15 NOTE — Discharge Instructions (Signed)
°  °          [facility] Instructions °[address]- [place] [address] °    ° °1. Avoid closing eyes tightly. One often closes the eye tightly when laughing, talking, sneezing, coughing or if they feel irritated. At these times, you should be careful not to close your eyes tightly. ° °2. Instill eye drops as instructed. To instill drops in your eye, open it, look up and have someone gently pull the lower lid down and instill a couple of drops inside the lower lid. ° °3. Do not touch upper lid. ° °4. Take Advil or Tylenol for pain. ° °5. You may use either eye for near work, such as reading or sewing and you may watch television. ° °6. You may have your hair done at the beauty parlor at any time. ° °7. Wear dark glasses with or without your own glasses if you are in bright light. ° °8. Call our office at [phone] or [phone] if you have sharp pain in your eye or unusual symptoms. ° °9.  FOLLOW UP WITH DR. SHAPIRO TODAY IN HIS [place] OFFICE AT 2:45pm. ° °  °I have received a copy of the above instructions and will follow them.  ° ° ° °IF YOU ARE IN IMMEDIATE DANGER CALL 911! ° °It is important for you to keep your follow-up appointment with your physician after discharge, OR, for you /your caregiver to make a follow-up appointment with your physician / medical provider after discharge. ° °Show these instructions to the next healthcare provider you see. ° °

## 2017-08-15 NOTE — Anesthesia Procedure Notes (Signed)
Procedure Name: MAC Date/Time: 08/15/2017 9:45 AM Performed by: Vista Deck, CRNA Pre-anesthesia Checklist: Patient identified, Emergency Drugs available, Suction available, Timeout performed and Patient being monitored Patient Re-evaluated:Patient Re-evaluated prior to induction Oxygen Delivery Method: Nasal Cannula

## 2017-08-16 ENCOUNTER — Encounter (HOSPITAL_COMMUNITY): Payer: Self-pay | Admitting: Ophthalmology

## 2017-08-24 ENCOUNTER — Ambulatory Visit (HOSPITAL_COMMUNITY): Payer: Self-pay | Admitting: Psychiatry

## 2017-08-24 DIAGNOSIS — J0110 Acute frontal sinusitis, unspecified: Secondary | ICD-10-CM | POA: Diagnosis not present

## 2017-11-09 DIAGNOSIS — E119 Type 2 diabetes mellitus without complications: Secondary | ICD-10-CM | POA: Diagnosis not present

## 2017-11-09 DIAGNOSIS — I10 Essential (primary) hypertension: Secondary | ICD-10-CM | POA: Diagnosis not present

## 2017-11-09 DIAGNOSIS — Z1389 Encounter for screening for other disorder: Secondary | ICD-10-CM | POA: Diagnosis not present

## 2017-11-09 DIAGNOSIS — K589 Irritable bowel syndrome without diarrhea: Secondary | ICD-10-CM | POA: Diagnosis not present

## 2017-11-09 DIAGNOSIS — E782 Mixed hyperlipidemia: Secondary | ICD-10-CM | POA: Diagnosis not present

## 2017-11-10 ENCOUNTER — Other Ambulatory Visit (INDEPENDENT_AMBULATORY_CARE_PROVIDER_SITE_OTHER): Payer: Self-pay | Admitting: Internal Medicine

## 2017-11-10 DIAGNOSIS — R197 Diarrhea, unspecified: Secondary | ICD-10-CM

## 2017-11-23 DIAGNOSIS — J45909 Unspecified asthma, uncomplicated: Secondary | ICD-10-CM | POA: Diagnosis not present

## 2017-11-23 DIAGNOSIS — J329 Chronic sinusitis, unspecified: Secondary | ICD-10-CM | POA: Diagnosis not present

## 2017-11-28 ENCOUNTER — Ambulatory Visit (HOSPITAL_COMMUNITY)
Admission: RE | Admit: 2017-11-28 | Discharge: 2017-11-28 | Disposition: A | Discharge status: Home / Self Care | Payer: Medicare Other | Source: Ambulatory Visit | Attending: Internal Medicine | Admitting: Internal Medicine

## 2017-11-28 ENCOUNTER — Other Ambulatory Visit (HOSPITAL_COMMUNITY): Payer: Self-pay | Admitting: Internal Medicine

## 2017-11-28 ENCOUNTER — Telehealth (INDEPENDENT_AMBULATORY_CARE_PROVIDER_SITE_OTHER): Payer: Self-pay | Admitting: Internal Medicine

## 2017-11-28 DIAGNOSIS — R05 Cough: Secondary | ICD-10-CM | POA: Diagnosis not present

## 2017-11-28 DIAGNOSIS — R059 Cough, unspecified: Secondary | ICD-10-CM

## 2017-11-28 DIAGNOSIS — R0602 Shortness of breath: Secondary | ICD-10-CM | POA: Diagnosis not present

## 2017-11-28 DIAGNOSIS — R197 Diarrhea, unspecified: Secondary | ICD-10-CM

## 2017-11-28 MED ORDER — DIPHENOXYLATE-ATROPINE 2.5-0.025 MG PO TABS
1.0000 | ORAL_TABLET | Freq: Three times a day (TID) | ORAL | 1 refills | Status: DC | PRN
Start: 2017-11-28 — End: 2018-08-21

## 2017-11-28 NOTE — Telephone Encounter (Signed)
Err

## 2017-12-19 ENCOUNTER — Other Ambulatory Visit (HOSPITAL_COMMUNITY): Payer: Self-pay | Admitting: Psychiatry

## 2018-01-08 DIAGNOSIS — E782 Mixed hyperlipidemia: Secondary | ICD-10-CM | POA: Diagnosis not present

## 2018-01-25 ENCOUNTER — Encounter (HOSPITAL_COMMUNITY): Payer: Self-pay | Admitting: Psychiatry

## 2018-01-25 ENCOUNTER — Ambulatory Visit (INDEPENDENT_AMBULATORY_CARE_PROVIDER_SITE_OTHER): Payer: 59 | Admitting: Psychiatry

## 2018-01-25 VITALS — BP 145/84 | HR 90 | Ht 66.0 in | Wt 158.0 lb

## 2018-01-25 DIAGNOSIS — Z736 Limitation of activities due to disability: Secondary | ICD-10-CM | POA: Diagnosis not present

## 2018-01-25 DIAGNOSIS — R450 Nervousness: Secondary | ICD-10-CM | POA: Diagnosis not present

## 2018-01-25 DIAGNOSIS — F330 Major depressive disorder, recurrent, mild: Secondary | ICD-10-CM | POA: Diagnosis not present

## 2018-01-25 DIAGNOSIS — M2550 Pain in unspecified joint: Secondary | ICD-10-CM | POA: Diagnosis not present

## 2018-01-25 DIAGNOSIS — F411 Generalized anxiety disorder: Secondary | ICD-10-CM | POA: Diagnosis not present

## 2018-01-25 DIAGNOSIS — Z6379 Other stressful life events affecting family and household: Secondary | ICD-10-CM

## 2018-01-25 DIAGNOSIS — M797 Fibromyalgia: Secondary | ICD-10-CM | POA: Diagnosis not present

## 2018-01-25 DIAGNOSIS — Z811 Family history of alcohol abuse and dependence: Secondary | ICD-10-CM

## 2018-01-25 DIAGNOSIS — R0602 Shortness of breath: Secondary | ICD-10-CM

## 2018-01-25 DIAGNOSIS — Z818 Family history of other mental and behavioral disorders: Secondary | ICD-10-CM

## 2018-01-25 DIAGNOSIS — Z87891 Personal history of nicotine dependence: Secondary | ICD-10-CM

## 2018-01-25 MED ORDER — VENLAFAXINE HCL ER 75 MG PO CP24
75.0000 mg | ORAL_CAPSULE | Freq: Two times a day (BID) | ORAL | 2 refills | Status: DC
Start: 2018-01-25 — End: 2018-03-12

## 2018-01-25 MED ORDER — LAMOTRIGINE 100 MG PO TABS: ORAL_TABLET | ORAL | 2 refills | Status: DC

## 2018-01-25 MED ORDER — LORAZEPAM 1 MG PO TABS: 1.0000 mg | ORAL_TABLET | Freq: Three times a day (TID) | ORAL | 2 refills | Status: DC | PRN

## 2018-01-25 NOTE — Progress Notes (Signed)
[facility] MD/PA/NP OP Progress Note  01/25/2018 2:53 PM Lauren [name]  MRN:  [id number]  Chief Complaint:  Chief Complaint    Depression; Manic Behavior; Anxiety; Follow-up     HPI: This patient is a 67 year old married white female who lives with her husband. She has one stepchild. Her step son killed himself 7 years ago at age 78. He was an alcoholic. The patient is on disability for fibromyalgia  The patient has a long history of depression. She was last hospitalized in 2010 after she took too many pain pills and got confused. She use to drink heavily but has been sober 23 years and is very active in [employer] and church. For the most part she feels her mood is stable. She sleeping pretty well. Her energy is good. She has a hard time functioning without Ativan but she only usually takes one pill a day side think this is reasonable. She tried a higher dose of Cymbalta but it made her manic and revved up  The patient returns after a long absence.  She was last here about 7 months ago.  She states that her husband was diagnosed with bladder cancer and they went back and forth to the [place] several times.  It got so bad that his bladder had to be removed at [facility].  His cancer has spread to the lymph nodes and she is concerned that his prognosis is not good.  Her husband also continues to abuse narcotics by her report and she is talked to his family doctor about this.  Obviously her home life is extremely stressful as she is providing total care for her husband.  She states that she has had a lot of anger and rage built up at the doctor's which she felt did not diagnose her husband's cancer soon enough.  She is rather excited today and somewhat agitated but was able to calm down.  She is sleeping well denies being depressed or suicidal.  I suggested we increase the Lamictal as this helps with the anger and mood stabilization and she agrees.  He also would like to get back into therapy here and we will set  this up  Visit Diagnosis:    ICD-10-CM   1. Mild episode of recurrent major depressive disorder ([place]) F33.0   2. Generalized anxiety disorder F41.1     Past Psychiatric History: Long history of depression and mood instability, she was last hospitalized about 4 years ago for suicidal ideation  Past Medical History:  Past Medical History:  Diagnosis Date  . Anxiety   . Chronic pain   . COPD (chronic obstructive pulmonary disease) ([place])   . Depression   . Fibromyalgia   . Fibromyalgia   . GERD (gastroesophageal reflux disease)   . Hepatitis C   . History of blood transfusion   . History of bronchitis   . IBS (irritable bowel syndrome)   . Osteoarthritis   . Pneumonia   . Prediabetes   . Shortness of breath dyspnea    allergies; increased pain;   . Vertigo   . Wears glasses     Past Surgical History:  Procedure Laterality Date  . CATARACT EXTRACTION W/PHACO Right 08/01/2017   Procedure: CATARACT EXTRACTION PHACO AND INTRAOCULAR LENS PLACEMENT RIGHT EYE;  Surgeon: Rutherford Guys, MD;  Location: [facility];  Service: Ophthalmology;  Laterality: Right;  CDE: 3.30  . CATARACT EXTRACTION W/PHACO Left 08/15/2017   Procedure: CATARACT EXTRACTION PHACO AND INTRAOCULAR LENS PLACEMENT (IOC);  Surgeon:  Rutherford Guys, MD;  Location: [facility];  Service: Ophthalmology;  Laterality: Left;  CDE: 4.00  . CHOLECYSTECTOMY    . COLONOSCOPY    . COLONOSCOPY N/A 10/23/2014   Procedure: COLONOSCOPY;  Surgeon: Rogene Houston, MD;  Location: [facility];  Service: Endoscopy;  Laterality: N/A;  1030  . ESOPHAGOGASTRODUODENOSCOPY N/A 10/23/2014   Procedure: ESOPHAGOGASTRODUODENOSCOPY (EGD);  Surgeon: Rogene Houston, MD;  Location: [facility];  Service: Endoscopy;  Laterality: N/A;  . KNEE ARTHROSCOPY Left 10/21/2015   Procedure: ARTHROSCOPY LEFT KNEE WITH MENICAL DEBRIDEMENT, Chondroplasty;  Surgeon: Gaynelle Arabian, MD;  Location: [facility];  Service: Orthopedics;  Laterality: Left;  Marland Kitchen MANDIBLE SURGERY   09/06/1979  . ORIF WRIST FRACTURE Right 07/23/2013   Procedure: OPEN REDUCTION INTERNAL FIXATION (ORIF) WRIST FRACTURE;  Surgeon: Roseanne Kaufman, MD;  Location: [place];  Service: Orthopedics;  Laterality: Right;  . TONSILLECTOMY    . TOTAL KNEE ARTHROPLASTY     2011 rt knee  . UPPER GASTROINTESTINAL ENDOSCOPY      Family Psychiatric History: See below  Family History:  Family History  Problem Relation Age of Onset  . Alcohol abuse Father   . Diabetes Father   . Stroke Father   . Depression Father   . Alcohol abuse Maternal Grandfather   . Alcohol abuse Maternal Grandmother   . Alcohol abuse Paternal Grandfather   . Alcohol abuse Paternal Grandmother   . Stroke Mother   . Irritable bowel syndrome Mother   . Sexual abuse Mother   . Diabetes Brother   . OCD Brother   . Healthy Brother   . OCD Brother   . Diabetes Brother   . Heart disease Brother   . Anxiety disorder Paternal Uncle   . Alcohol abuse Paternal Uncle   . Alcohol abuse Cousin   . Anxiety disorder Maternal Uncle   . ADD / ADHD Neg Hx   . Bipolar disorder Neg Hx   . Dementia Neg Hx   . Drug abuse Neg Hx   . Paranoid behavior Neg Hx   . Schizophrenia Neg Hx   . Seizures Neg Hx   . Physical abuse Neg Hx     Social History:  Social History   Socioeconomic History  . Marital status: Married    Spouse name: Not on file  . Number of children: Not on file  . Years of education: Not on file  . Highest education level: Not on file  Occupational History  . Not on file  Social Needs  . Financial resource strain: Not on file  . Food insecurity:    Worry: Not on file    Inability: Not on file  . Transportation needs:    Medical: Not on file    Non-medical: Not on file  Tobacco Use  . Smoking status: Former Smoker    Packs/day: 2.00    Years: 15.00    Pack years: 30.00    Types: Cigarettes    Last attempt to quit: 09/05/1982    Years since quitting: 35.4  . Smokeless tobacco: Never Used  Substance and  Sexual Activity  . Alcohol use: No    Alcohol/week: 0.0 oz    Comment: history of alcoholism; pt states has been sober 25 years  . Drug use: No  . Sexual activity: Never  Lifestyle  . Physical activity:    Days per week: Not on file    Minutes per session: Not on file  . Stress: Not on file  Relationships  . Social connections:    Talks on phone: Not on file    Gets together: Not on file    Attends religious service: Not on file    Active member of club or organization: Not on file    Attends meetings of clubs or organizations: Not on file    Relationship status: Not on file  Other Topics Concern  . Not on file  Social History Narrative  . Not on file    Allergies:  Allergies  Allergen Reactions  . Elavil [Amitriptyline] Other (See Comments)    Vivid dreams and almost suicidal  . Flagyl [Metronidazole] Itching  . Vistaril [Hydroxyzine Hcl] Other (See Comments)    Got higher than a kite on too much of this.  [place] [Ziprasidone Hydrochloride] Other (See Comments)    Blacked out  . Lactose Intolerance (Gi) Other (See Comments)    GI upset  . Latex Other (See Comments)    Red at site    Metabolic Disorder Labs: Lab Results  Component Value Date   HGBA1C 6.1 (H) 10/13/2015   MPG 128 10/13/2015   No results found for: PROLACTIN No results found for: CHOL, TRIG, HDL, CHOLHDL, VLDL, LDLCALC Lab Results  Component Value Date   TSH 2.400 03/19/2014    Therapeutic Level Labs: No results found for: LITHIUM No results found for: VALPROATE No components found for:  CBMZ  Current Medications: Current Outpatient Medications  Medication Sig Dispense Refill  . albuterol (PROAIR HFA) 108 (90 BASE) MCG/ACT inhaler Inhale 2 puffs into the lungs every 4 (four) hours as needed for wheezing or shortness of breath.    Marland Kitchen aspirin EC 81 MG tablet Take 81 mg by mouth daily.    . budesonide-formoterol (SYMBICORT) 160-4.5 MCG/ACT inhaler Inhale 2 puffs into the lungs 2 (two) times  daily.    . calcium-vitamin D (OSCAL WITH D) 500-200 MG-UNIT per tablet Take 1 tablet by mouth 2 (two) times daily. For low calcium (Patient taking differently: Take 1 tablet daily by mouth. For low calcium)    . Coenzyme Q10 (COQ10) 100 MG CAPS Take 1 capsule daily by mouth.    . diclofenac sodium (VOLTAREN) 1 % GEL Apply 2 g topically 4 (four) times daily as needed (For pain.).     Marland Kitchen dicyclomine (BENTYL) 20 MG tablet Take 1 tablet (20 mg total) by mouth every 6 (six) hours as needed for spasms. 90 tablet 2  . diphenoxylate-atropine (LOMOTIL) 2.5-0.025 MG tablet Take 1 tablet by mouth 3 (three) times daily as needed. 60 tablet 1  . gabapentin (NEURONTIN) 100 MG capsule Take 100-200 mg at bedtime by mouth.     . lamoTRIgine (LAMICTAL) 25 MG tablet Take 2 tablets (50 mg total) by mouth 2 (two) times daily. 120 tablet 2  . LORazepam (ATIVAN) 1 MG tablet Take 1 tablet (1 mg total) by mouth every 8 (eight) hours as needed for anxiety. 90 tablet 2  . Multiple Vitamins-Minerals (MULTIVITAMIN WITH MINERALS) tablet Take 1 tablet daily by mouth.    . naproxen (NAPROSYN) 250 MG tablet Take 1 tablet (250 mg total) by mouth 2 (two) times daily with a meal. 20 tablet 0  . oxyCODONE (ROXICODONE) 5 MG immediate release tablet Take 1-2 tablets (5-10 mg total) by mouth every 4 (four) hours as needed for severe pain. 50 tablet 0  . pravastatin (PRAVACHOL) 80 MG tablet Take 80 mg at bedtime by mouth.     . ranitidine (ZANTAC) 150 MG tablet  TAKE 1 TABLET (150 MG TOTAL) BY MOUTH 2 (TWO) TIMES DAILY. 60 tablet 2  . venlafaxine XR (EFFEXOR XR) 75 MG 24 hr capsule Take 1 capsule (75 mg total) by mouth 2 (two) times daily. 180 capsule 2  . vitamin E 400 UNIT capsule Take 400 Units daily by mouth.    . lamoTRIgine (LAMICTAL) 100 MG tablet Take one half in the am and one in the evening 135 tablet 2   No current facility-administered medications for this visit.      Musculoskeletal: Strength & Muscle Tone: within normal  limits Gait & Station: normal Patient leans: N/A  Psychiatric Specialty Exam: Review of Systems  Respiratory: Positive for shortness of breath.   Musculoskeletal: Positive for joint pain.  Psychiatric/Behavioral: The patient is nervous/anxious.   All other systems reviewed and are negative.   Blood pressure (!) 145/84, pulse 90, height 5\' 6"  (1.676 m), weight 158 lb (71.7 kg), SpO2 97 %.Body mass index is 25.5 kg/m.  General Appearance: Casual and Fairly Groomed  Eye Contact:  Good  Speech:  Clear and Coherent and Pressured  Volume:  Increased  Mood:  Angry and Anxious  Affect:  Full Range  Thought Process:  Goal Directed  Orientation:  Full (Time, Place, and Person)  Thought Content: Rumination   Suicidal Thoughts:  No  Homicidal Thoughts:  No  Memory:  Immediate;   Good Recent;   Good Remote;   Good  Judgement:  Good  Insight:  Fair  Psychomotor Activity:  Restlessness  Concentration:  Concentration: Fair and Attention Span: Fair  Recall:  Good  Fund of Knowledge: Good  Language: Good  Akathisia:  No  Handed:  Right  AIMS (if indicated): not done  Assets:  Communication Skills Desire for Improvement Resilience Social Support Talents/Skills Transportation  ADL's:  Intact  Cognition: WNL  Sleep:  Good   Screenings: AUDIT     Admission (Discharged) from 03/18/2014 in [facility]  Alcohol Use Disorder Identification Test Final Score (AUDIT)  0       Assessment and Plan: Patient is a 67 year old female with a history of bipolar disorder and anxiety.  Unfortunately she is going through a lot of stress with her husband's illness as well as his addiction.  I have strongly encouraged her to take care of herself first.  We will reschedule counseling here for her.  We will increase Lamictal to 100 mg in the evening and 50 mg in the morning for mood stabilization.  She will continue Ativan 1 mg every 8 hours as needed for anxiety as well  as Effexor X are 75 mg twice a day for depression.  She will return to see me in 6 weeks or call sooner if needed   Levonne Spiller, MD 01/25/2018, 2:53 PM

## 2018-01-31 ENCOUNTER — Ambulatory Visit (HOSPITAL_COMMUNITY): Admitting: Psychiatry

## 2018-02-12 ENCOUNTER — Other Ambulatory Visit (HOSPITAL_COMMUNITY): Payer: Self-pay | Admitting: Internal Medicine

## 2018-02-12 DIAGNOSIS — J329 Chronic sinusitis, unspecified: Secondary | ICD-10-CM | POA: Diagnosis not present

## 2018-02-12 DIAGNOSIS — H8113 Benign paroxysmal vertigo, bilateral: Secondary | ICD-10-CM | POA: Diagnosis not present

## 2018-02-12 DIAGNOSIS — I872 Venous insufficiency (chronic) (peripheral): Secondary | ICD-10-CM | POA: Diagnosis not present

## 2018-02-12 DIAGNOSIS — Z1231 Encounter for screening mammogram for malignant neoplasm of breast: Secondary | ICD-10-CM

## 2018-02-12 DIAGNOSIS — I10 Essential (primary) hypertension: Secondary | ICD-10-CM | POA: Diagnosis not present

## 2018-02-21 ENCOUNTER — Other Ambulatory Visit (HOSPITAL_COMMUNITY): Payer: Self-pay | Admitting: Psychiatry

## 2018-02-21 NOTE — Progress Notes (Signed)
Cardiology Office Note   Date:  02/22/2018   ID:  Lauren [name], DOB 1951/08/10, MRN [id number]  PCP:  Redmond School, MD  Cardiologist:   Jenkins Rouge, MD   No chief complaint on file.     History of Present Illness: Lauren [name] is a 67 y.o. female who presents for consultation regarding exertional dyspnea. Referred by Dr Lauren [name] Reviewed his office note from 02/13/18 Primary complaint was concern for sinus infection She has COPD, Chronic Pain , Fibromyalgia, and HLD Seen by Dr Lauren [name] in 2014 for chest pain and had normal TTE EF 55-60%  No valve disease normal estimated PA pressure and normal myovue with EF 77% 01/17/13  Last CXR done in Epic 11/28/17 showed NAD.   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 Concerned about her family history of CHF and premature CAD   Past Medical History:  Diagnosis Date  . Anxiety   . Chronic pain   . COPD (chronic obstructive pulmonary disease) ([place])   . Depression   . Fibromyalgia   . Fibromyalgia   . GERD (gastroesophageal reflux disease)   . Hepatitis C   . History of blood transfusion   . History of bronchitis   . IBS (irritable bowel syndrome)   . Osteoarthritis   . Pneumonia   . Prediabetes   . Shortness of breath dyspnea    allergies; increased pain;   . Vertigo   . Wears glasses     Past Surgical History:  Procedure Laterality Date  . CATARACT EXTRACTION W/PHACO Right 08/01/2017   Procedure: CATARACT EXTRACTION PHACO AND INTRAOCULAR LENS PLACEMENT RIGHT EYE;  Surgeon: Rutherford Guys, MD;  [facility]: [facility];  Service: Ophthalmology;  Laterality: Right;  CDE: 3.30  . CATARACT EXTRACTION W/PHACO Left 08/15/2017   Procedure: CATARACT EXTRACTION PHACO AND INTRAOCULAR  LENS PLACEMENT (IOC);  Surgeon: Rutherford Guys, MD;  Location: [facility];  Service: Ophthalmology;  Laterality: Left;  CDE: 4.00  . CHOLECYSTECTOMY    . COLONOSCOPY    . COLONOSCOPY N/A 10/23/2014   Procedure: COLONOSCOPY;  Surgeon: Rogene Houston, MD;  [facility];  Service: Endoscopy;  Laterality: N/A;  1030  . ESOPHAGOGASTRODUODENOSCOPY N/A 10/23/2014   Procedure: ESOPHAGOGASTRODUODENOSCOPY (EGD);  Surgeon: Rogene Houston, MD;  [facility];  Service: Endoscopy;  Laterality: N/A;  . KNEE ARTHROSCOPY Left 10/21/2015   Procedure: ARTHROSCOPY LEFT KNEE WITH MENICAL DEBRIDEMENT, Chondroplasty;  Surgeon: Gaynelle Arabian, MD;  [facility];  Service: Orthopedics;  Laterality: Left;  Marland Kitchen MANDIBLE SURGERY  09/06/1979  . ORIF WRIST FRACTURE Right 07/23/2013   Procedure: OPEN REDUCTION INTERNAL FIXATION (ORIF) WRIST FRACTURE;  Surgeon: Roseanne Kaufman, MD;  Location: [facility];  Service: Orthopedics;  Laterality: Right;  . TONSILLECTOMY    . TOTAL KNEE ARTHROPLASTY     2011 rt knee  . UPPER GASTROINTESTINAL ENDOSCOPY       Current Outpatient Medications  Medication Sig Dispense Refill  . albuterol (PROAIR HFA) 108 (90 BASE) MCG/ACT inhaler Inhale 2 puffs into the lungs every 4 (four) hours as needed for wheezing or shortness of breath.    Marland Kitchen aspirin EC 81 MG tablet Take 81 mg by mouth daily.    . budesonide-formoterol (SYMBICORT) 160-4.5 MCG/ACT inhaler Inhale  2 puffs into the lungs 2 (two) times daily.    . calcium-vitamin D (OSCAL WITH D) 500-200 MG-UNIT per tablet Take 1 tablet by mouth 2 (two) times daily. For low calcium (Patient taking differently: Take 1 tablet daily by mouth. For low calcium)    . Coenzyme Q10 (COQ10) 100 MG CAPS Take 1 capsule daily by mouth.    . diclofenac sodium (VOLTAREN) 1 % GEL Apply 2 g topically 4 (four) times daily as needed (For pain.).     Marland Kitchen dicyclomine (BENTYL) 20 MG tablet Take 1 tablet (20 mg total) by mouth every 6 (six) hours as needed for  spasms. 90 tablet 2  . diphenoxylate-atropine (LOMOTIL) 2.5-0.025 MG tablet Take 1 tablet by mouth 3 (three) times daily as needed. 60 tablet 1  . gabapentin (NEURONTIN) 100 MG capsule Take 100-200 mg at bedtime by mouth.     Marland Kitchen HYDROcodone-acetaminophen (NORCO) 10-325 MG tablet TAKE 1 TABLET BY MOUTH 4 TIMES A DAY AS NEEDED FOR 30 DAYS  0  . lamoTRIgine (LAMICTAL) 100 MG tablet Take one half in the am and one in the evening 135 tablet 2  . LORazepam (ATIVAN) 1 MG tablet Take 1 tablet (1 mg total) by mouth every 8 (eight) hours as needed for anxiety. 90 tablet 2  . Multiple Vitamins-Minerals (MULTIVITAMIN WITH MINERALS) tablet Take 1 tablet daily by mouth.    . naproxen (NAPROSYN) 250 MG tablet Take 1 tablet (250 mg total) by mouth 2 (two) times daily with a meal. 20 tablet 0  . pravastatin (PRAVACHOL) 80 MG tablet Take 80 mg at bedtime by mouth.     . ranitidine (ZANTAC) 150 MG tablet TAKE 1 TABLET (150 MG TOTAL) BY MOUTH 2 (TWO) TIMES DAILY. 60 tablet 2  . venlafaxine XR (EFFEXOR XR) 75 MG 24 hr capsule Take 1 capsule (75 mg total) by mouth 2 (two) times daily. 180 capsule 2  . vitamin E 400 UNIT capsule Take 400 Units daily by mouth.     No current facility-administered medications for this visit.     Allergies:   Elavil [amitriptyline]; Flagyl [metronidazole]; Vistaril [hydroxyzine hcl]; Geodon [ziprasidone hydrochloride]; Lactose intolerance (gi); and Latex    Social History:  The patient  reports that she quit smoking about 35 years ago. Her smoking use included cigarettes. She has a 30.00 pack-year smoking history. She has never used smokeless tobacco. She reports that she does not drink alcohol or use drugs.   Family History:  The patient's family history includes Alcohol abuse in her cousin, father, maternal grandfather, maternal grandmother, paternal grandfather, paternal grandmother, and paternal uncle; Anxiety disorder in her maternal uncle and paternal uncle; Depression in her  father; Diabetes in her brother, brother, and father; Healthy in her brother; Heart disease in her brother; Irritable bowel syndrome in her mother; OCD in her brother and brother; Sexual abuse in her mother; Stroke in her father and mother.    ROS:  Please see the history of present illness.   Otherwise, review of systems are positive for none.   All other systems are reviewed and negative.    PHYSICAL EXAM: VS:  BP 140/80 (BP Location: Right Arm)   Pulse 80   Wt 154 lb (69.9 kg)   SpO2 97%   BMI 24.86 kg/m  , BMI Body mass index is 24.86 kg/m. Affect appropriate Healthy:  appears stated age 45: normal Neck supple with no adenopathy JVP normal no bruits no thyromegaly Lungs clear with no wheezing and  good diaphragmatic motion Heart:  S1/S2 no murmur, no rub, gallop or click PMI normal Abdomen: benighn, BS positve, no tenderness, no AAA no bruit.  No HSM or HJR Distal pulses intact with no bruits No edema Neuro non-focal Skin warm and dry No muscular weakness    EKG:  01/26/17 SR rate 78 normal  02/22/18 SR rate 84 inferolateral Q waves    Recent Labs: 07/25/2017: BUN 18; Creatinine, Ser 0.68; Hemoglobin 13.4; Platelets 203; Potassium 4.2; Sodium 138    Lipid Panel No results found for: CHOL, TRIG, HDL, CHOLHDL, VLDL, LDLCALC, LDLDIRECT    Wt Readings from Last 3 Encounters:  02/22/18 154 lb (69.9 kg)  01/25/18 158 lb (71.7 kg)  08/15/17 148 lb (67.1 kg)      Other studies Reviewed: Additional studies/ records that were reviewed today include: Notes from Dr C TTE, Myovue 2014 Notes [place] June 2019 .    ASSESSMENT AND PLAN:  1.  Dyspnea r/o anginal equivalent Lexiscan and TTE suspect it has more to do with reactive airway disease And anxiety  2. HLD continue statin labs with primary  3. Depression/Anxiety on Effexor f/u with counselor  4. Fibromyalgia  Not clear that this warrants disability see Rx #3    Current medicines are reviewed at length with  the patient today.  The patient does not have concerns regarding medicines.  The following changes have been made:  no change  Labs/ tests ordered today include: Lexiscan Myovue / TTE   Orders Placed This Encounter  Procedures  . NM Myocar Multi W/Spect W/Wall Motion / EF  . EKG 12-Lead  . ECHOCARDIOGRAM COMPLETE     Disposition:   FU with cardiology PRN      Signed, Jenkins Rouge, MD  02/22/2018 2:03 PM    [address], [place], [place]  [address] Phone: [phone]; Fax: [phone]

## 2018-02-22 ENCOUNTER — Encounter: Payer: Self-pay | Admitting: Cardiovascular Disease

## 2018-02-22 ENCOUNTER — Ambulatory Visit (INDEPENDENT_AMBULATORY_CARE_PROVIDER_SITE_OTHER): Payer: Medicare Other | Admitting: Cardiovascular Disease

## 2018-02-22 VITALS — BP 140/80 | HR 80 | Wt 154.0 lb

## 2018-02-22 DIAGNOSIS — R079 Chest pain, unspecified: Secondary | ICD-10-CM

## 2018-02-22 DIAGNOSIS — R0600 Dyspnea, unspecified: Secondary | ICD-10-CM | POA: Diagnosis not present

## 2018-02-22 DIAGNOSIS — R5383 Other fatigue: Secondary | ICD-10-CM | POA: Diagnosis not present

## 2018-02-22 NOTE — Patient Instructions (Signed)
Medication Instructions:  Your physician recommends that you continue on your current medications as directed. Please refer to the Current Medication list given to you today.   Labwork: NONE  Testing/Procedures: Your physician has requested that you have an echocardiogram. Echocardiography is a painless test that uses sound waves to create images of your heart. It provides your doctor with information about the size and shape of your heart and how well your heart's chambers and valves are working. This procedure takes approximately one hour. There are no restrictions for this procedure.  Your physician has requested that you have a lexiscan myoview. For further information please visit [email or web address]. Please follow instruction sheet, as given.    Follow-Up: Your physician recommends that you schedule a follow-up appointment in: AS NEEDED    Any Other Special Instructions Will Be Listed Below (If Applicable).     If you need a refill on your cardiac medications before your next appointment, please call your pharmacy.

## 2018-02-28 ENCOUNTER — Ambulatory Visit (HOSPITAL_COMMUNITY): Payer: Medicare Other | Attending: Cardiovascular Disease

## 2018-02-28 ENCOUNTER — Ambulatory Visit (HOSPITAL_COMMUNITY): Payer: Medicare Other

## 2018-02-28 ENCOUNTER — Encounter (HOSPITAL_COMMUNITY): Payer: Self-pay

## 2018-03-02 DIAGNOSIS — Z96651 Presence of right artificial knee joint: Secondary | ICD-10-CM | POA: Diagnosis not present

## 2018-03-02 DIAGNOSIS — M1712 Unilateral primary osteoarthritis, left knee: Secondary | ICD-10-CM | POA: Diagnosis not present

## 2018-03-06 ENCOUNTER — Telehealth: Payer: Self-pay

## 2018-03-06 DIAGNOSIS — R0600 Dyspnea, unspecified: Secondary | ICD-10-CM

## 2018-03-06 DIAGNOSIS — R079 Chest pain, unspecified: Secondary | ICD-10-CM

## 2018-03-06 NOTE — Telephone Encounter (Signed)
The patient was seen by Dr. Johnsie Cancel on 6/20 for exertional dyspnea. An echo and stress test were ordered but not done as the patient was sick. She is agreeable to reschedule these tests and then we will revisit her clearance based on the results.   Please call the patient to reschedule nuclear stress test and echo.

## 2018-03-06 NOTE — Telephone Encounter (Signed)
Pt is scheduled to have her echo and lexiscan myoview on 03/16/18. I have notified pt of appointments and she is okay with the time and day. I went over instructions for nuclear test and I have also mailed pt a copy of instructions.

## 2018-03-06 NOTE — Telephone Encounter (Signed)
This encounter was created in error - please disregard.

## 2018-03-06 NOTE — Telephone Encounter (Signed)
   [facility] Pre-operative Risk Assessment    Request for surgical clearance:  1. What type of surgery is being performed? L TKA   2. When is this surgery scheduled? 04/16/2018   3. What type of clearance is required (medical clearance vs. Pharmacy clearance to hold med vs. Both)? MEDICAL  4. Are there any medications that need to be held prior to surgery and how long?    5. Practice name and name of physician performing surgery? [facility]/Dr Aluisio   6. What is your office phone number [phone]    7.   What is your office fax number [phone]  8.   Anesthesia type (None, local, MAC, general) ? choice   Lauren [name] 03/06/2018, 10:26 AM  _________________________________________________________________   (provider comments below)

## 2018-03-09 ENCOUNTER — Inpatient Hospital Stay (HOSPITAL_COMMUNITY): Admission: RE | Admit: 2018-03-09 | Payer: Self-pay | Source: Ambulatory Visit

## 2018-03-12 ENCOUNTER — Ambulatory Visit (INDEPENDENT_AMBULATORY_CARE_PROVIDER_SITE_OTHER): Payer: Medicare Other | Admitting: Psychiatry

## 2018-03-12 ENCOUNTER — Encounter (HOSPITAL_COMMUNITY): Payer: Self-pay | Admitting: Psychiatry

## 2018-03-12 VITALS — BP 134/82 | HR 105 | Ht 66.0 in | Wt 155.0 lb

## 2018-03-12 DIAGNOSIS — F332 Major depressive disorder, recurrent severe without psychotic features: Secondary | ICD-10-CM | POA: Diagnosis not present

## 2018-03-12 DIAGNOSIS — F411 Generalized anxiety disorder: Secondary | ICD-10-CM

## 2018-03-12 MED ORDER — VENLAFAXINE HCL ER 75 MG PO CP24: 75.0000 mg | ORAL_CAPSULE | Freq: Two times a day (BID) | ORAL | 2 refills | Status: DC

## 2018-03-12 MED ORDER — LORAZEPAM 1 MG PO TABS: 1.5000 mg | ORAL_TABLET | Freq: Every day | ORAL | 2 refills | Status: DC

## 2018-03-12 MED ORDER — LAMOTRIGINE 100 MG PO TABS: ORAL_TABLET | ORAL | 2 refills | Status: DC

## 2018-03-12 NOTE — Progress Notes (Signed)
[facility] MD/PA/NP OP Progress Note  03/12/2018 2:47 PM Lauren [name]  MRN:  [id number]  Chief Complaint:  Chief Complaint    Depression; Manic Behavior; Anxiety; Follow-up     HPI: This patient is a 67 year old married white female who lives with her husband. She has one stepchild. Her step son killed himself 7 years ago at age 52. He was an alcoholic. The patient is on disability for fibromyalgia  The patient has a long history of depression. She was last hospitalized in 2010 after she took too many pain pills and got confused. She use to drink heavily but has been sober 23 years and is very active in [employer] and church. For the most part she feels her mood is stable. She sleeping pretty well. Her energy is good. She has a hard time functioning without Ativan but she only usually takes one pill a day side think this is reasonable. She tried a higher dose of Cymbalta but it made her manic and revved up  The patient returns after 6 weeks.  Last time she was very agitated because she felt that her husband's bladder cancer was let go too long.  His bladder was removed but by that time the cancer is spread to lymph nodes which also had to be removed.  She states that she is calm down since and her husband is doing somewhat better although in her opinion he continues to abuse pain medication.  She tried increasing Lamictal at night but it made her too drowsy and she is gone back to 50 mg twice a day.  She states that she takes Ativan 1.5 mg at bedtime to help her sleep and I suggested she cut this back and increase the Lamictal again and she claims she will try it.  She states that her depression is under good control.  Her mood has been fairly good and she is anticipating having left knee replacement in August.  Visit Diagnosis:    ICD-10-CM   1. Major depressive disorder, recurrent, severe without psychotic features ([place]) F33.2   2. Generalized anxiety disorder F41.1     Past Psychiatric History: Long  history of depression and mood instability, she was last hospitalized about 4 years ago for suicidal ideation  Past Medical History:  Past Medical History:  Diagnosis Date  . Anxiety   . Chronic pain   . COPD (chronic obstructive pulmonary disease) ([place])   . Depression   . Fibromyalgia   . Fibromyalgia   . GERD (gastroesophageal reflux disease)   . Hepatitis C   . History of blood transfusion   . History of bronchitis   . IBS (irritable bowel syndrome)   . Osteoarthritis   . Pneumonia   . Prediabetes   . Shortness of breath dyspnea    allergies; increased pain;   . Vertigo   . Wears glasses     Past Surgical History:  Procedure Laterality Date  . CATARACT EXTRACTION W/PHACO Right 08/01/2017   Procedure: CATARACT EXTRACTION PHACO AND INTRAOCULAR LENS PLACEMENT RIGHT EYE;  Surgeon: Rutherford Guys, MD;  [facility];  Service: Ophthalmology;  Laterality: Right;  CDE: 3.30  . CATARACT EXTRACTION W/PHACO Left 08/15/2017   Procedure: CATARACT EXTRACTION PHACO AND INTRAOCULAR LENS PLACEMENT (IOC);  Surgeon: Rutherford Guys, MD;  [facility];  Service: Ophthalmology;  Laterality: Left;  CDE: 4.00  . CHOLECYSTECTOMY    . COLONOSCOPY    . COLONOSCOPY N/A 10/23/2014   Procedure: COLONOSCOPY;  Surgeon: Rogene Houston,  MD;  [facility];  Service: Endoscopy;  Laterality: N/A;  1030  . ESOPHAGOGASTRODUODENOSCOPY N/A 10/23/2014   Procedure: ESOPHAGOGASTRODUODENOSCOPY (EGD);  Surgeon: Rogene Houston, MD;  [facility];  Service: Endoscopy;  Laterality: N/A;  . KNEE ARTHROSCOPY Left 10/21/2015   Procedure: ARTHROSCOPY LEFT KNEE WITH MENICAL DEBRIDEMENT, Chondroplasty;  Surgeon: Gaynelle Arabian, MD;  [facility];  Service: Orthopedics;  Laterality: Left;  Marland Kitchen MANDIBLE SURGERY  09/06/1979  . ORIF WRIST FRACTURE Right 07/23/2013   Procedure: OPEN REDUCTION INTERNAL FIXATION (ORIF) WRIST FRACTURE;  Surgeon: Roseanne Kaufman, MD;  Location: [facility];  Service: Orthopedics;   Laterality: Right;  . TONSILLECTOMY    . TOTAL KNEE ARTHROPLASTY     2011 rt knee  . UPPER GASTROINTESTINAL ENDOSCOPY      Family Psychiatric History: See below  Family History:  Family History  Problem Relation Age of Onset  . Alcohol abuse Father   . Diabetes Father   . Stroke Father   . Depression Father   . Alcohol abuse Maternal Grandfather   . Alcohol abuse Maternal Grandmother   . Alcohol abuse Paternal Grandfather   . Alcohol abuse Paternal Grandmother   . Stroke Mother   . Irritable bowel syndrome Mother   . Sexual abuse Mother   . Diabetes Brother   . OCD Brother   . Healthy Brother   . OCD Brother   . Diabetes Brother   . Heart disease Brother   . Anxiety disorder Paternal Uncle   . Alcohol abuse Paternal Uncle   . Alcohol abuse Cousin   . Anxiety disorder Maternal Uncle   . ADD / ADHD Neg Hx   . Bipolar disorder Neg Hx   . Dementia Neg Hx   . Drug abuse Neg Hx   . Paranoid behavior Neg Hx   . Schizophrenia Neg Hx   . Seizures Neg Hx   . Physical abuse Neg Hx     Social History:  Social History   Socioeconomic History  . Marital status: Married    Spouse name: Not on file  . Number of children: Not on file  . Years of education: Not on file  . Highest education level: Not on file  Occupational History  . Not on file  Social Needs  . Financial resource strain: Not on file  . Food insecurity:    Worry: Not on file    Inability: Not on file  . Transportation needs:    Medical: Not on file    Non-medical: Not on file  Tobacco Use  . Smoking status: Former Smoker    Packs/day: 2.00    Years: 15.00    Pack years: 30.00    Types: Cigarettes    Last attempt to quit: 09/05/1982    Years since quitting: 35.5  . Smokeless tobacco: Never Used  Substance and Sexual Activity  . Alcohol use: No    Alcohol/week: 0.0 oz    Comment: history of alcoholism; pt states has been sober 25 years  . Drug use: No  . Sexual activity: Never  Lifestyle  .  Physical activity:    Days per week: Not on file    Minutes per session: Not on file  . Stress: Not on file  Relationships  . Social connections:    Talks on phone: Not on file    Gets together: Not on file    Attends religious service: Not on file    Active member of club or  organization: Not on file    Attends meetings of clubs or organizations: Not on file    Relationship status: Not on file  Other Topics Concern  . Not on file  Social History Narrative  . Not on file    Allergies:  Allergies  Allergen Reactions  . Elavil [Amitriptyline] Other (See Comments)    Vivid dreams and almost suicidal  . Flagyl [Metronidazole] Itching  . Vistaril [Hydroxyzine Hcl] Other (See Comments)    Got higher than a kite on too much of this.  [place] [Ziprasidone Hydrochloride] Other (See Comments)    Blacked out  . Lactose Intolerance (Gi) Other (See Comments)    GI upset  . Latex Other (See Comments)    Red at site    Metabolic Disorder Labs: Lab Results  Component Value Date   HGBA1C 6.1 (H) 10/13/2015   MPG 128 10/13/2015   No results found for: PROLACTIN No results found for: CHOL, TRIG, HDL, CHOLHDL, VLDL, LDLCALC Lab Results  Component Value Date   TSH 2.400 03/19/2014    Therapeutic Level Labs: No results found for: LITHIUM No results found for: VALPROATE No components found for:  CBMZ  Current Medications: Current Outpatient Medications  Medication Sig Dispense Refill  . albuterol (PROAIR HFA) 108 (90 BASE) MCG/ACT inhaler Inhale 2 puffs into the lungs every 4 (four) hours as needed for wheezing or shortness of breath.    Marland Kitchen aspirin EC 81 MG tablet Take 81 mg by mouth daily.    . budesonide-formoterol (SYMBICORT) 160-4.5 MCG/ACT inhaler Inhale 2 puffs into the lungs 2 (two) times daily.    . calcium-vitamin D (OSCAL WITH D) 500-200 MG-UNIT per tablet Take 1 tablet by mouth 2 (two) times daily. For low calcium (Patient taking differently: Take 1 tablet daily by  mouth. For low calcium)    . Coenzyme Q10 (COQ10) 100 MG CAPS Take 1 capsule daily by mouth.    . diclofenac sodium (VOLTAREN) 1 % GEL Apply 2 g topically 4 (four) times daily as needed (For pain.).     Marland Kitchen dicyclomine (BENTYL) 20 MG tablet Take 1 tablet (20 mg total) by mouth every 6 (six) hours as needed for spasms. 90 tablet 2  . diphenoxylate-atropine (LOMOTIL) 2.5-0.025 MG tablet Take 1 tablet by mouth 3 (three) times daily as needed. 60 tablet 1  . gabapentin (NEURONTIN) 100 MG capsule Take 100-200 mg at bedtime by mouth.     Marland Kitchen HYDROcodone-acetaminophen (NORCO) 10-325 MG tablet TAKE 1 TABLET BY MOUTH 4 TIMES A DAY AS NEEDED FOR 30 DAYS  0  . lamoTRIgine (LAMICTAL) 100 MG tablet Take one half in the am and one in the evening 135 tablet 2  . LORazepam (ATIVAN) 1 MG tablet Take 1.5 tablets (1.5 mg total) by mouth at bedtime. 45 tablet 2  . Multiple Vitamins-Minerals (MULTIVITAMIN WITH MINERALS) tablet Take 1 tablet daily by mouth.    . naproxen (NAPROSYN) 250 MG tablet Take 1 tablet (250 mg total) by mouth 2 (two) times daily with a meal. 20 tablet 0  . pravastatin (PRAVACHOL) 80 MG tablet Take 80 mg at bedtime by mouth.     . ranitidine (ZANTAC) 150 MG tablet TAKE 1 TABLET (150 MG TOTAL) BY MOUTH 2 (TWO) TIMES DAILY. 60 tablet 2  . venlafaxine XR (EFFEXOR XR) 75 MG 24 hr capsule Take 1 capsule (75 mg total) by mouth 2 (two) times daily. 180 capsule 2  . vitamin E 400 UNIT capsule Take 400 Units daily  by mouth.     No current facility-administered medications for this visit.      Musculoskeletal: Strength & Muscle Tone: within normal limits Gait & Station: normal Patient leans: N/A  Psychiatric Specialty Exam: Review of Systems  Musculoskeletal: Positive for joint pain.  Psychiatric/Behavioral: The patient is nervous/anxious.   All other systems reviewed and are negative.   Blood pressure 134/82, pulse (!) 105, height 5\' 6"  (1.676 m), weight 155 lb (70.3 kg), SpO2 95 %.Body mass index  is 25.02 kg/m.  General Appearance: Casual and Fairly Groomed  Eye Contact:  Good  Speech:  Clear and Coherent  Volume:  Normal  Mood:  Anxious  Affect:  Congruent  Thought Process:  Goal Directed  Orientation:  Full (Time, Place, and Person)  Thought Content: Rumination   Suicidal Thoughts:  No  Homicidal Thoughts:  No  Memory:  Immediate;   Good Recent;   Good Remote;   Good  Judgement:  Fair  Insight:  Fair  Psychomotor Activity:  Restlessness  Concentration:  Concentration: Fair and Attention Span: Fair  Recall:  Good  Fund of Knowledge: Good  Language: Good  Akathisia:  No  Handed:  Right  AIMS (if indicated): not done  Assets:  Communication Skills Desire for Improvement Resilience Social Support Talents/Skills  ADL's:  Intact  Cognition: WNL  Sleep:  Good   Screenings: AUDIT     Admission (Discharged) from 03/18/2014 in [facility]  Alcohol Use Disorder Identification Test Final Score (AUDIT)  0       Assessment and Plan: This patient is a 67 year old female with a history of bipolar disorder and anxiety.  Given all the stress with her husband's illness she seems to be holding up fairly well.  She will try to increase Lamictal to 50 mill grams every morning and 100 mill grams at bedtime for mood stabilization, continue Ativan 1.5 mg at bedtime or try to reduce it to 1 mg if possible.  She will continue Effexor XR 75 mg twice daily for depression.  She will return to see me in 3 months   Levonne Spiller, MD 03/12/2018, 2:47 PM

## 2018-03-14 ENCOUNTER — Telehealth (HOSPITAL_COMMUNITY): Payer: Self-pay | Admitting: *Deleted

## 2018-03-14 NOTE — Telephone Encounter (Signed)
Patient given detailed instructions per Myocardial Perfusion Study Information Sheet for the test on 03/16/18 at 7:15. Patient notified to arrive 15 minutes early and that it is imperative to arrive on time for appointment to keep from having the test rescheduled.  If you need to cancel or reschedule your appointment, please call the office within 24 hours of your appointment. . Patient verbalized understanding. Lauren [name]

## 2018-03-16 ENCOUNTER — Ambulatory Visit (HOSPITAL_BASED_OUTPATIENT_CLINIC_OR_DEPARTMENT_OTHER): Payer: Medicare Other

## 2018-03-16 ENCOUNTER — Other Ambulatory Visit: Payer: Self-pay

## 2018-03-16 ENCOUNTER — Ambulatory Visit (HOSPITAL_COMMUNITY): Payer: Medicare Other | Attending: Cardiovascular Disease

## 2018-03-16 DIAGNOSIS — I10 Essential (primary) hypertension: Secondary | ICD-10-CM | POA: Diagnosis not present

## 2018-03-16 DIAGNOSIS — R0600 Dyspnea, unspecified: Secondary | ICD-10-CM | POA: Diagnosis not present

## 2018-03-16 DIAGNOSIS — I071 Rheumatic tricuspid insufficiency: Secondary | ICD-10-CM | POA: Diagnosis not present

## 2018-03-16 DIAGNOSIS — R079 Chest pain, unspecified: Secondary | ICD-10-CM | POA: Diagnosis not present

## 2018-03-16 DIAGNOSIS — E1140 Type 2 diabetes mellitus with diabetic neuropathy, unspecified: Secondary | ICD-10-CM | POA: Diagnosis not present

## 2018-03-16 DIAGNOSIS — G894 Chronic pain syndrome: Secondary | ICD-10-CM | POA: Diagnosis not present

## 2018-03-16 DIAGNOSIS — M797 Fibromyalgia: Secondary | ICD-10-CM | POA: Insufficient documentation

## 2018-03-16 DIAGNOSIS — Z0001 Encounter for general adult medical examination with abnormal findings: Secondary | ICD-10-CM | POA: Diagnosis not present

## 2018-03-16 DIAGNOSIS — J449 Chronic obstructive pulmonary disease, unspecified: Secondary | ICD-10-CM | POA: Insufficient documentation

## 2018-03-16 DIAGNOSIS — I517 Cardiomegaly: Secondary | ICD-10-CM | POA: Diagnosis not present

## 2018-03-16 LAB — MYOCARDIAL PERFUSION IMAGING
CHL CUP NUCLEAR SDS: 4
CHL CUP NUCLEAR SSS: 8
LHR: 0.3
LV dias vol: 61 mL (ref 46–106)
LV sys vol: 18 mL
NUC STRESS TID: 0.98
Peak HR: 99 {beats}/min
Rest HR: 80 {beats}/min
SRS: 4

## 2018-03-16 LAB — ECHOCARDIOGRAM COMPLETE
HEIGHTINCHES: 66 in
WEIGHTICAEL: 2464 [oz_av]

## 2018-03-16 MED ORDER — TECHNETIUM TC 99M TETROFOSMIN IV KIT
10.2000 | PACK | Freq: Once | INTRAVENOUS | Status: AC | PRN
  Administered 2018-03-16: 10.2 via INTRAVENOUS
  Filled 2018-03-16: qty 11

## 2018-03-16 MED ORDER — REGADENOSON 0.4 MG/5ML IV SOLN
0.4000 mg | Freq: Once | INTRAVENOUS | Status: AC
  Administered 2018-03-16: 0.4 mg via INTRAVENOUS

## 2018-03-16 MED ORDER — TECHNETIUM TC 99M TETROFOSMIN IV KIT
32.1000 | PACK | Freq: Once | INTRAVENOUS | Status: AC | PRN
  Administered 2018-03-16: 32.1 via INTRAVENOUS
  Filled 2018-03-16: qty 33

## 2018-03-16 NOTE — Telephone Encounter (Signed)
   Primary Cardiologist: Jenkins Rouge, MD  Chart reviewed as part of pre-operative protocol coverage. She was seen by Dr. Johnsie Cancel on 02/22/2018 who ordered echo and nuclear stress test which were both normal. Lauren [name] has dyspnea on exertion, but there is no evidence that this is cardiac related. She also has reactive airway disease that may be contributing.   Based on ACC/AHA guidelines, Lauren [name] would be at acceptable risk for the planned procedure without further cardiovascular testing.   I will route this recommendation to the requesting party via Epic fax function and remove from pre-op pool.  Please call with questions.  Daune Perch, NP 03/16/2018, 4:54 PM

## 2018-03-21 DIAGNOSIS — M79671 Pain in right foot: Secondary | ICD-10-CM | POA: Diagnosis not present

## 2018-03-21 DIAGNOSIS — M9261 Juvenile osteochondrosis of tarsus, right ankle: Secondary | ICD-10-CM | POA: Diagnosis not present

## 2018-04-06 NOTE — Patient Instructions (Addendum)
Lauren [name]  04/06/2018   Your procedure is scheduled on: 04-16-18   Report to [facility] Main  Entrance    Report to admitting at 5:30AM    Call this number if you have problems the morning of surgery [phone]     Remember: Do not eat food or drink liquids :After Midnight.     Take these medicines the morning of surgery with A SIP OF WATER: VENLAFAXINE, RANITIDINE, HYDROCODONE IF NEEDED; SYMBICORT INHALER, ALBUTEROL INHALER IF NEEDED (PLEASE BRING) . lamictal                                 You may not have any metal on your body including hair pins and              piercings  Do not wear jewelry, make-up, lotions, powders or perfumes, deodorant             Do not wear nail polish.  Do not shave  48 hours prior to surgery.     Do not bring valuables to the hospital. [place].  Contacts, dentures or bridgework may not be worn into surgery.  Leave suitcase in the car. After surgery it may be brought to your room.                Please read over the following fact sheets you were given: _____________________________________________________________________             [facility] - Preparing for Surgery Before surgery, you can play an important role.  Because skin is not sterile, your skin needs to be as free of germs as possible.  You can reduce the number of germs on your skin by washing with CHG (chlorahexidine gluconate) soap before surgery.  CHG is an antiseptic cleaner which kills germs and bonds with the skin to continue killing germs even after washing. Please DO NOT use if you have an allergy to CHG or antibacterial soaps.  If your skin becomes reddened/irritated stop using the CHG and inform your nurse when you arrive at Short Stay. Do not shave (including legs and underarms) for at least 48 hours prior to the first CHG shower.  You may shave your face/neck. Please follow these  instructions carefully:  1.  Shower with CHG Soap the night before surgery and the  morning of Surgery.  2.  If you choose to wash your hair, wash your hair first as usual with your  normal  shampoo.  3.  After you shampoo, rinse your hair and body thoroughly to remove the  shampoo.                           4.  Use CHG as you would any other liquid soap.  You can apply chg directly  to the skin and wash                       Gently with a scrungie or clean washcloth.  5.  Apply the CHG Soap to your body ONLY FROM THE NECK DOWN.   Do not use on face/ open  Wound or open sores. Avoid contact with eyes, ears mouth and genitals (private parts).                       Wash face,  Genitals (private parts) with your normal soap.             6.  Wash thoroughly, paying special attention to the area where your surgery  will be performed.  7.  Thoroughly rinse your body with warm water from the neck down.  8.  DO NOT shower/wash with your normal soap after using and rinsing off  the CHG Soap.                9.  Pat yourself dry with a clean towel.            10.  Wear clean pajamas.            11.  Place clean sheets on your bed the night of your first shower and do not  sleep with pets. Day of Surgery : Do not apply any lotions/deodorants the morning of surgery.  Please wear clean clothes to the hospital/surgery center.  FAILURE TO FOLLOW THESE INSTRUCTIONS MAY RESULT IN THE CANCELLATION OF YOUR SURGERY PATIENT SIGNATURE_________________________________  NURSE SIGNATURE__________________________________  ________________________________________________________________________   Adam Phenix  An incentive spirometer is a tool that can help keep your lungs clear and active. This tool measures how well you are filling your lungs with each breath. Taking long deep breaths may help reverse or decrease the chance of developing breathing (pulmonary) problems (especially  infection) following:  A long period of time when you are unable to move or be active. BEFORE THE PROCEDURE   If the spirometer includes an indicator to show your best effort, your nurse or respiratory therapist will set it to a desired goal.  If possible, sit up straight or lean slightly forward. Try not to slouch.  Hold the incentive spirometer in an upright position. INSTRUCTIONS FOR USE  1. Sit on the edge of your bed if possible, or sit up as far as you can in bed or on a chair. 2. Hold the incentive spirometer in an upright position. 3. Breathe out normally. 4. Place the mouthpiece in your mouth and seal your lips tightly around it. 5. Breathe in slowly and as deeply as possible, raising the piston or the ball toward the top of the column. 6. Hold your breath for 3-5 seconds or for as long as possible. Allow the piston or ball to fall to the bottom of the column. 7. Remove the mouthpiece from your mouth and breathe out normally. 8. Rest for a few seconds and repeat Steps 1 through 7 at least 10 times every 1-2 hours when you are awake. Take your time and take a few normal breaths between deep breaths. 9. The spirometer may include an indicator to show your best effort. Use the indicator as a goal to work toward during each repetition. 10. After each set of 10 deep breaths, practice coughing to be sure your lungs are clear. If you have an incision (the cut made at the time of surgery), support your incision when coughing by placing a pillow or rolled up towels firmly against it. Once you are able to get out of bed, walk around indoors and cough well. You may stop using the incentive spirometer when instructed by your caregiver.  RISKS AND COMPLICATIONS  Take your time so you do not get  dizzy or light-headed.  If you are in pain, you may need to take or ask for pain medication before doing incentive spirometry. It is harder to take a deep breath if you are having pain. AFTER  USE  Rest and breathe slowly and easily.  It can be helpful to keep track of a log of your progress. Your caregiver can provide you with a simple table to help with this. If you are using the spirometer at home, follow these instructions: [place] IF:   You are having difficultly using the spirometer.  You have trouble using the spirometer as often as instructed.  Your pain medication is not giving enough relief while using the spirometer.  You develop fever of 100.5 F (38.1 C) or higher. SEEK IMMEDIATE MEDICAL CARE IF:   You cough up bloody sputum that had not been present before.  You develop fever of 102 F (38.9 C) or greater.  You develop worsening pain at or near the incision site. MAKE SURE YOU:   Understand these instructions.  Will watch your condition.  Will get help right away if you are not doing well or get worse. Document Released: 01/02/2007 Document Revised: 11/14/2011 Document Reviewed: 03/05/2007 Exit[employer] Patient Information 2014 [employer], [place].   ________________________________________________________________________  WHAT IS A BLOOD TRANSFUSION? Blood Transfusion Information  A transfusion is the replacement of blood or some of its parts. Blood is made up of multiple cells which provide different functions.  Red blood cells carry oxygen and are used for blood loss replacement.  White blood cells fight against infection.  Platelets control bleeding.  Plasma helps clot blood.  Other blood products are available for specialized needs, such as hemophilia or other clotting disorders. BEFORE THE TRANSFUSION  Who gives blood for transfusions?   Healthy volunteers who are fully evaluated to make sure their blood is safe. This is blood bank blood. Transfusion therapy is the safest it has ever been in the practice of medicine. Before blood is taken from a donor, a complete history is taken to make sure that person has no history of diseases  nor engages in risky social behavior (examples are intravenous drug use or sexual activity with multiple partners). The donor's travel history is screened to minimize risk of transmitting infections, such as malaria. The donated blood is tested for signs of infectious diseases, such as HIV and hepatitis. The blood is then tested to be sure it is compatible with you in order to minimize the chance of a transfusion reaction. If you or a relative donates blood, this is often done in anticipation of surgery and is not appropriate for emergency situations. It takes many days to process the donated blood. RISKS AND COMPLICATIONS Although transfusion therapy is very safe and saves many lives, the main dangers of transfusion include:   Getting an infectious disease.  Developing a transfusion reaction. This is an allergic reaction to something in the blood you were given. Every precaution is taken to prevent this. The decision to have a blood transfusion has been considered carefully by your caregiver before blood is given. Blood is not given unless the benefits outweigh the risks. AFTER THE TRANSFUSION  Right after receiving a blood transfusion, you will usually feel much better and more energetic. This is especially true if your red blood cells have gotten low (anemic). The transfusion raises the level of the red blood cells which carry oxygen, and this usually causes an energy increase.  The nurse administering the transfusion will  monitor you carefully for complications. HOME CARE INSTRUCTIONS  No special instructions are needed after a transfusion. You may find your energy is better. Speak with your caregiver about any limitations on activity for underlying diseases you may have. SEEK MEDICAL CARE IF:   Your condition is not improving after your transfusion.  You develop redness or irritation at the intravenous (IV) site. SEEK IMMEDIATE MEDICAL CARE IF:  Any of the following symptoms occur over the  next 12 hours:  Shaking chills.  You have a temperature by mouth above 102 F (38.9 C), not controlled by medicine.  Chest, back, or muscle pain.  People around you feel you are not acting correctly or are confused.  Shortness of breath or difficulty breathing.  Dizziness and fainting.  You get a rash or develop hives.  You have a decrease in urine output.  Your urine turns a dark color or changes to pink, red, or brown. Any of the following symptoms occur over the next 10 days:  You have a temperature by mouth above 102 F (38.9 C), not controlled by medicine.  Shortness of breath.  Weakness after normal activity.  The white part of the eye turns yellow (jaundice).  You have a decrease in the amount of urine or are urinating less often.  Your urine turns a dark color or changes to pink, red, or brown. Document Released: 08/19/2000 Document Revised: 11/14/2011 Document Reviewed: 04/07/2008 [facility] Patient Information 2014 [place], [place].  _______________________________________________________________________

## 2018-04-06 NOTE — Progress Notes (Signed)
CARDIAC CLEARANCE (SEE TELE NOTE Epic )/ ALSO ON CHART DATED 03-26-18  EKG 02-22-18 Epic   ECHO /STRESS 03-16-18 EPIC     CXR 11-29-17 Epic

## 2018-04-09 ENCOUNTER — Other Ambulatory Visit: Payer: Self-pay

## 2018-04-09 ENCOUNTER — Encounter (HOSPITAL_COMMUNITY)
Admission: RE | Admit: 2018-04-09 | Discharge: 2018-04-09 | Disposition: A | Discharge status: Home / Self Care | Payer: Medicare Other | Source: Ambulatory Visit | Attending: Orthopedic Surgery | Admitting: Orthopedic Surgery

## 2018-04-09 ENCOUNTER — Encounter (HOSPITAL_COMMUNITY): Payer: Self-pay

## 2018-04-09 DIAGNOSIS — Z01812 Encounter for preprocedural laboratory examination: Secondary | ICD-10-CM | POA: Diagnosis not present

## 2018-04-09 DIAGNOSIS — M1712 Unilateral primary osteoarthritis, left knee: Secondary | ICD-10-CM | POA: Insufficient documentation

## 2018-04-09 LAB — CBC
HEMATOCRIT: 42 % (ref 36.0–46.0)
HEMOGLOBIN: 13.3 g/dL (ref 12.0–15.0)
MCH: 28.5 pg (ref 26.0–34.0)
MCHC: 31.7 g/dL (ref 30.0–36.0)
MCV: 90.1 fL (ref 78.0–100.0)
Platelets: 228 10*3/uL (ref 150–400)
RBC: 4.66 MIL/uL (ref 3.87–5.11)
RDW: 13.1 % (ref 11.5–15.5)
WBC: 6.1 10*3/uL (ref 4.0–10.5)

## 2018-04-09 LAB — COMPREHENSIVE METABOLIC PANEL
ALK PHOS: 61 U/L (ref 38–126)
ALT: 27 U/L (ref 0–44)
ANION GAP: 7 (ref 5–15)
AST: 26 U/L (ref 15–41)
Albumin: 4.3 g/dL (ref 3.5–5.0)
BILIRUBIN TOTAL: 0.5 mg/dL (ref 0.3–1.2)
BUN: 13 mg/dL (ref 8–23)
CO2: 32 mmol/L (ref 22–32)
Calcium: 9.6 mg/dL (ref 8.9–10.3)
Chloride: 100 mmol/L (ref 98–111)
Creatinine, Ser: 0.71 mg/dL (ref 0.44–1.00)
GFR calc non Af Amer: >60 mL/min (ref >60)
Glucose, Bld: 91 mg/dL (ref 70–99)
Potassium: 4.1 mmol/L (ref 3.5–5.1)
Sodium: 139 mmol/L (ref 135–145)
TOTAL PROTEIN: 7.8 g/dL (ref 6.5–8.1)

## 2018-04-09 LAB — URINALYSIS, ROUTINE W REFLEX MICROSCOPIC
Bilirubin Urine: NEGATIVE
Glucose, UA: NEGATIVE mg/dL
Hgb urine dipstick: NEGATIVE
KETONES UR: NEGATIVE mg/dL
LEUKOCYTES UA: NEGATIVE
NITRITE: NEGATIVE
PH: 7 (ref 5.0–8.0)
Protein, ur: NEGATIVE mg/dL
SPECIFIC GRAVITY, URINE: 1.006 (ref 1.005–1.030)

## 2018-04-09 LAB — SURGICAL PCR SCREEN
MRSA, PCR: NEGATIVE
Staphylococcus aureus: NEGATIVE

## 2018-04-09 LAB — PROTIME-INR
INR: 0.92
PROTHROMBIN TIME: 12.2 s (ref 11.4–15.2)

## 2018-04-09 LAB — APTT: aPTT: 35 seconds (ref 24–36)

## 2018-04-15 MED ORDER — BUPIVACAINE LIPOSOME 1.3 % IJ SUSP
20.0000 mL | Freq: Once | INTRAMUSCULAR | Status: DC
  Filled 2018-04-15: qty 20

## 2018-04-15 MED ORDER — TRANEXAMIC ACID 1000 MG/10ML IV SOLN
2000.0000 mg | INTRAVENOUS | Status: DC
  Filled 2018-04-15: qty 20

## 2018-04-15 NOTE — Anesthesia Preprocedure Evaluation (Addendum)
Anesthesia Evaluation  Patient identified by MRN, date of birth, ID band Patient awake    Reviewed: Allergy & Precautions, NPO status , Patient's Chart, lab work & pertinent test results  History of Anesthesia Complications Negative for: history of anesthetic complications  Airway Mallampati: III  TM Distance: >3 FB Neck ROM: Full    Dental  (+) Dental Advisory Given, Teeth Intact   Pulmonary COPD,  COPD inhaler, former smoker,    breath sounds clear to auscultation       Cardiovascular  Rhythm:Regular Rate:Normal   '19 Myoperfusion Scan - Nuclear stress EF: 70%. The left ventricular EF is hyperdynamic (>65%). There was no ST segment deviation noted during stress. No T wave inversion was noted during stress. The study is normal. This is a low risk study.  '19 TTE - Mild focal basal hypertrophy of the ventricular septum. EF 55% to 60%. Mild TR.    Neuro/Psych Anxiety Depression  Vertigo     GI/Hepatic GERD  Controlled and Medicated,(+) Hepatitis -, C  Endo/Other  negative endocrine ROS  Renal/GU negative Renal ROS  negative genitourinary   Musculoskeletal  (+) Arthritis , Osteoarthritis,  Fibromyalgia -, narcotic dependent  Abdominal   Peds  Hematology negative hematology ROS (+)   Anesthesia Other Findings   Reproductive/Obstetrics                            Anesthesia Physical Anesthesia Plan  ASA: III  Anesthesia Plan: Spinal   Post-op Pain Management:  [facility] for Post-op pain   Induction:   PONV Risk Score and Plan: 2 and Treatment may vary due to age or medical condition and Propofol infusion  Airway Management Planned: Natural Airway and Simple Face Mask  Additional Equipment: None  Intra-op Plan:   Post-operative Plan:   Informed Consent: I have reviewed the patients History and Physical, chart, labs and discussed the procedure including the risks, benefits  and alternatives for the proposed anesthesia with the patient or authorized representative who has indicated his/her understanding and acceptance.     Plan Discussed with: CRNA and Anesthesiologist  Anesthesia Plan Comments: (Labs reviewed. Platelets acceptable, patient not taking any blood thinning medications. Risks and benefits discussed with patient, patient expressed understanding and wished to proceed.)       Anesthesia Quick Evaluation

## 2018-04-15 NOTE — H&P (Signed)
TOTAL KNEE ADMISSION H&P  Patient is being admitted for left total knee arthroplasty.  Subjective:  Chief Complaint:left knee pain.  HPI: Lauren [name], 67 y.o. female, has a history of pain and functional disability in the left knee due to arthritis and has failed non-surgical conservative treatments for greater than 12 weeks to includeNSAID's and/or analgesics, corticosteriod injections, flexibility and strengthening excercises and activity modification.  Onset of symptoms was gradual, starting 3 years ago with gradually worsening course since that time. The patient noted prior procedures on the knee to include  arthroscopy and menisectomy on the left knee(s).  Patient currently rates pain in the left knee(s) at 7 out of 10 with activity. Patient has night pain, worsening of pain with activity and weight bearing, pain that interferes with activities of daily living, pain with passive range of motion, crepitus and joint swelling.  Patient has evidence of periarticular osteophytes and joint space narrowing by imaging studies. There is no active infection.  Patient Active Problem List   Diagnosis Date Noted  . Acute medial meniscal tear 10/21/2015  . GERD (gastroesophageal reflux disease) 01/21/2015  . Fibromyalgia 09/11/2014  . MDD (major depressive disorder) 03/19/2014  . Pain 07/25/2012  . Insomnia due to mental disorder 07/25/2012  . OA (osteoarthritis) 02/14/2012  . Hepatitis C 02/14/2012  . IBS (irritable bowel syndrome) 02/14/2012  . Depression 12/27/2011   Past Medical History:  Diagnosis Date  . Anxiety   . Chronic pain   . COPD (chronic obstructive pulmonary disease) ([place])   . Depression   . Fibromyalgia   . Fibromyalgia   . GERD (gastroesophageal reflux disease)   . Hepatitis C   . History of blood transfusion   . History of bronchitis   . IBS (irritable bowel syndrome)   . Osteoarthritis   . Pneumonia   . Prediabetes   . Shortness of breath dyspnea    allergies;  increased pain;   . Vertigo   . Wears glasses     Past Surgical History:  Procedure Laterality Date  . CATARACT EXTRACTION W/PHACO Right 08/01/2017   Procedure: CATARACT EXTRACTION PHACO AND INTRAOCULAR LENS PLACEMENT RIGHT EYE;  Surgeon: Rutherford Guys, MD;  Location: [facility];  Service: Ophthalmology;  Laterality: Right;  CDE: 3.30  . CATARACT EXTRACTION W/PHACO Left 08/15/2017   Procedure: CATARACT EXTRACTION PHACO AND INTRAOCULAR LENS PLACEMENT (IOC);  Surgeon: Rutherford Guys, MD;  Location: [facility];  Service: Ophthalmology;  Laterality: Left;  CDE: 4.00  . CHOLECYSTECTOMY    . COLONOSCOPY    . COLONOSCOPY N/A 10/23/2014   Procedure: COLONOSCOPY;  Surgeon: Rogene Houston, MD;  Location: [facility];  Service: Endoscopy;  Laterality: N/A;  1030  . ESOPHAGOGASTRODUODENOSCOPY N/A 10/23/2014   Procedure: ESOPHAGOGASTRODUODENOSCOPY (EGD);  Surgeon: Rogene Houston, MD;  Location: [facility];  Service: Endoscopy;  Laterality: N/A;  . KNEE ARTHROSCOPY Left 10/21/2015   Procedure: ARTHROSCOPY LEFT KNEE WITH MENICAL DEBRIDEMENT, Chondroplasty;  Surgeon: Gaynelle Arabian, MD;  Location: [facility];  Service: Orthopedics;  Laterality: Left;  Marland Kitchen MANDIBLE SURGERY  09/06/1979  . ORIF WRIST FRACTURE Right 07/23/2013   Procedure: OPEN REDUCTION INTERNAL FIXATION (ORIF) WRIST FRACTURE;  Surgeon: Roseanne Kaufman, MD;  Location: [facility];  Service: Orthopedics;  Laterality: Right;  . TONSILLECTOMY    . TOTAL KNEE ARTHROPLASTY     2011 rt knee  . UPPER GASTROINTESTINAL ENDOSCOPY      Current Facility-Administered Medications  Medication Dose Route Frequency Provider Last Rate Last Dose  . [  START ON 04/16/2018] bupivacaine liposome (EXPAREL) 1.3 % injection 266 mg  20 mL Other Once Gaynelle Arabian, MD      . Derrill Memo ON 04/16/2018] tranexamic acid (CYKLOKAPRON) 2,000 mg in sodium chloride 0.9 % 50 mL Topical Application  1,696 mg Topical To OR Gaynelle Arabian, MD       Current Outpatient Medications  Medication Sig  Dispense Refill Last Dose  . albuterol (PROAIR HFA) 108 (90 BASE) MCG/ACT inhaler Inhale 2 puffs into the lungs every 4 (four) hours as needed for wheezing or shortness of breath.   Taking  . aspirin EC 81 MG tablet Take 81 mg by mouth at bedtime.    Taking  . Biotin w/ Vitamins C & E (HAIR/SKIN/NAILS PO) Take 2 tablets by mouth daily.     . budesonide-formoterol (SYMBICORT) 160-4.5 MCG/ACT inhaler Inhale 2 puffs into the lungs 2 (two) times daily.   Taking  . calcium-vitamin D (OSCAL WITH D) 500-200 MG-UNIT per tablet Take 1 tablet by mouth 2 (two) times daily. For low calcium (Patient taking differently: Take 1 tablet daily by mouth. For low calcium)   Taking  . Coenzyme Q10 (COQ10) 100 MG CAPS Take 1 capsule daily by mouth.   Taking  . Cyanocobalamin (B-12) 2000 MCG TABS Take 2,000 mcg by mouth daily.     . diclofenac sodium (VOLTAREN) 1 % GEL Apply 2 g topically 4 (four) times daily as needed (For pain.).    Taking  . dicyclomine (BENTYL) 20 MG tablet Take 1 tablet (20 mg total) by mouth every 6 (six) hours as needed for spasms. 90 tablet 2 Taking  . diphenoxylate-atropine (LOMOTIL) 2.5-0.025 MG tablet Take 1 tablet by mouth 3 (three) times daily as needed. (Patient taking differently: Take 1 tablet by mouth 3 (three) times daily as needed for diarrhea or loose stools. ) 60 tablet 1 Taking  . gabapentin (NEURONTIN) 100 MG capsule Take 100-200 mg by mouth 2 (two) times daily as needed (for pain from fibromyalgia).    Taking  . HYDROcodone-acetaminophen (NORCO) 10-325 MG tablet Take 1 tablet by mouth every 6 (six) hours as needed (for pain.).     Marland Kitchen Krill Oil 350 MG CAPS Take 350 mg by mouth daily.     Marland Kitchen lamoTRIgine (LAMICTAL) 100 MG tablet Take one half in the am and one in the evening (Patient taking differently: Take 50-100 mg by mouth See admin instructions. Take 0.5 tablet (50 mg) in the morning & take 1 tablet (100 mg) in the evening.) 135 tablet 2   . LORazepam (ATIVAN) 1 MG tablet Take 1.5  tablets (1.5 mg total) by mouth at bedtime. 45 tablet 2   . Misc Natural Products (OSTEO BI-FLEX JOINT SHIELD PO) Take 1 tablet by mouth daily.     . Multiple Vitamins-Minerals (MULTIVITAMIN WITH MINERALS) tablet Take 1 tablet daily by mouth.   Taking  . naproxen sodium (ALEVE) 220 MG tablet Take 220-440 mg by mouth 2 (two) times daily as needed (for pain.).     Marland Kitchen pravastatin (PRAVACHOL) 80 MG tablet Take 80 mg at bedtime by mouth.    Taking  . ranitidine (ZANTAC) 150 MG tablet TAKE 1 TABLET (150 MG TOTAL) BY MOUTH 2 (TWO) TIMES DAILY. 60 tablet 2 Taking  . tiZANidine (ZANAFLEX) 4 MG tablet Take 4 mg by mouth 3 (three) times daily as needed for pain.  1   . Turmeric 450 MG CAPS Take 450 mg by mouth daily.     Marland Kitchen venlafaxine XR (  EFFEXOR XR) 75 MG 24 hr capsule Take 1 capsule (75 mg total) by mouth 2 (two) times daily. (Patient taking differently: Take 75 mg by mouth 2 (two) times daily. Morning & afternoon) 180 capsule 2   . vitamin E 400 UNIT capsule Take 400 Units daily by mouth.   Taking   Allergies  Allergen Reactions  . Elavil [Amitriptyline] Other (See Comments)    Vivid dreams and almost suicidal  . Flagyl [Metronidazole] Itching  . Vistaril [Hydroxyzine Hcl] Other (See Comments)    Got higher than a kite on too much of this.  [place] Royal [Ziprasidone Hydrochloride] Other (See Comments)    Blacked out  . Lactose Intolerance (Gi) Other (See Comments)    GI upset  . Latex Other (See Comments)    Red at site    Social History   Tobacco Use  . Smoking status: Former Smoker    Packs/day: 2.00    Years: 15.00    Pack years: 30.00    Types: Cigarettes    Last attempt to quit: 09/05/1982    Years since quitting: 35.6  . Smokeless tobacco: Never Used  Substance Use Topics  . Alcohol use: No    Alcohol/week: 0.0 standard drinks    Comment: history of alcoholism; pt states has been sober 25 years    Family History  Problem Relation Age of Onset  . Alcohol abuse Father   . Diabetes  Father   . Stroke Father   . Depression Father   . Alcohol abuse Maternal Grandfather   . Alcohol abuse Maternal Grandmother   . Alcohol abuse Paternal Grandfather   . Alcohol abuse Paternal Grandmother   . Stroke Mother   . Irritable bowel syndrome Mother   . Sexual abuse Mother   . Diabetes Brother   . OCD Brother   . Healthy Brother   . OCD Brother   . Diabetes Brother   . Heart disease Brother   . Anxiety disorder Paternal Uncle   . Alcohol abuse Paternal Uncle   . Alcohol abuse Cousin   . Anxiety disorder Maternal Uncle   . ADD / ADHD Neg Hx   . Bipolar disorder Neg Hx   . Dementia Neg Hx   . Drug abuse Neg Hx   . Paranoid behavior Neg Hx   . Schizophrenia Neg Hx   . Seizures Neg Hx   . Physical abuse Neg Hx      Review of Systems  Constitutional: Negative.   HENT: Negative.   Eyes: Negative.   Respiratory: Negative.   Cardiovascular: Negative.   Gastrointestinal: Negative.   Genitourinary: Negative.   Musculoskeletal: Positive for joint pain and myalgias. Negative for back pain, falls and neck pain.  Skin: Negative.   Neurological: Negative.   Endo/Heme/Allergies: Negative.   Psychiatric/Behavioral: Negative.     Objective:  Physical Exam  Constitutional: She is oriented to person, place, and time. She appears well-developed and well-nourished. No distress.  HENT:  Head: Normocephalic and atraumatic.  Right Ear: External ear normal.  Left Ear: External ear normal.  Nose: Nose normal.  Mouth/Throat: Oropharynx is clear and moist.  Eyes: Conjunctivae and EOM are normal.  Neck: Normal range of motion. Neck supple.  Cardiovascular: Normal rate, regular rhythm, normal heart sounds and intact distal pulses.  No murmur heard. Respiratory: Effort normal and breath sounds normal. No respiratory distress. She has no wheezes.  GI: Soft. Bowel sounds are normal. She exhibits no distension. There is no tenderness.  Musculoskeletal:       Right hip: Normal.        Left hip: Normal.  Right Knee Exam: No effusion. Range of motion is 0-130 degrees. No crepitus on range of motion of the knee. No medial or lateral joint line tenderness. Stable knee.   Left Knee Exam: No effusion. Varus deformity. Range of motion is 5-125 degrees. Moderate crepitus on range of motion of the knee. Some medial greater than lateral joint line tenderness. Stable knee.  Neurological: She is alert and oriented to person, place, and time. She has normal strength. No sensory deficit.  Skin: No rash noted. She is not diaphoretic. No erythema.  Psychiatric: She has a normal mood and affect. Her behavior is normal.    Vital Signs Ht: 5 ft 6 in  Wt: 160 lbs  BMI: 25.8  BP: 132/80 sitting L arm  Pulse: 84 bpm   Imaging Review Plain radiographs demonstrate severe degenerative joint disease of the left knee(s). The overall alignment ismild varus. The bone quality appears to be good for age and reported activity level.   Preoperative templating of the joint replacement has been completed, documented, and submitted to the Operating Room personnel in order to optimize intra-operative equipment management.   Anticipated LOS equal to or greater than 2 midnights due to - Age 73 and older with one or more of the following:  - Obesity  - Expected need for hospital services (PT, OT, Nursing) required for safe  discharge  - Anticipated need for postoperative skilled nursing care or inpatient rehab  - Active co-morbidities: Diabetes and Respiratory Failure/COPD OR   - Unanticipated findings during/Post Surgery: None  - Patient is a high risk of re-admission due to: None     Assessment/Plan:  End stage primary osteoarthritis, left knee   The patient history, physical examination, clinical judgment of the provider and imaging studies are consistent with end stage degenerative joint disease of the left knee(s) and total knee arthroplasty is deemed medically necessary. The  treatment options including medical management, injection therapy arthroscopy and arthroplasty were discussed at length. The risks and benefits of total knee arthroplasty were presented and reviewed. The risks due to aseptic loosening, infection, stiffness, patella tracking problems, thromboembolic complications and other imponderables were discussed. The patient acknowledged the explanation, agreed to proceed with the plan and consent was signed. Patient is being admitted for inpatient treatment for surgery, pain control, PT, OT, prophylactic antibiotics, VTE prophylaxis, progressive ambulation and ADL's and discharge planning. The patient is planning to be discharged home   Therapy Plans: HHPT vs outpatient therapy Disposition: Home with husband DME needed: none PCP: Dr. Gerarda Fraction Cardio: Dr. Johnsie Cancel Other: last A1C 6 TXA IV   Ardeen Jourdain, PA-C

## 2018-04-16 ENCOUNTER — Inpatient Hospital Stay (HOSPITAL_COMMUNITY): Payer: Medicare Other | Admitting: Anesthesiology

## 2018-04-16 ENCOUNTER — Encounter (HOSPITAL_COMMUNITY)
Admission: RE | Disposition: A | Discharge status: Home / Self Care | Payer: Self-pay | Source: Ambulatory Visit | Attending: Orthopedic Surgery

## 2018-04-16 ENCOUNTER — Other Ambulatory Visit: Payer: Self-pay

## 2018-04-16 ENCOUNTER — Inpatient Hospital Stay (HOSPITAL_COMMUNITY)
Admission: RE | Admit: 2018-04-16 | Discharge: 2018-04-18 | DRG: 470 | Disposition: A | Discharge status: Home / Self Care | Payer: Medicare Other | Source: Ambulatory Visit | Attending: Orthopedic Surgery | Admitting: Orthopedic Surgery

## 2018-04-16 ENCOUNTER — Encounter (HOSPITAL_COMMUNITY): Payer: Self-pay | Admitting: Certified Registered Nurse Anesthetist

## 2018-04-16 DIAGNOSIS — M1710 Unilateral primary osteoarthritis, unspecified knee: Secondary | ICD-10-CM

## 2018-04-16 DIAGNOSIS — F329 Major depressive disorder, single episode, unspecified: Secondary | ICD-10-CM | POA: Diagnosis present

## 2018-04-16 DIAGNOSIS — M797 Fibromyalgia: Secondary | ICD-10-CM | POA: Diagnosis present

## 2018-04-16 DIAGNOSIS — Z91040 Latex allergy status: Secondary | ICD-10-CM | POA: Diagnosis not present

## 2018-04-16 DIAGNOSIS — Z96651 Presence of right artificial knee joint: Secondary | ICD-10-CM | POA: Diagnosis present

## 2018-04-16 DIAGNOSIS — Z7951 Long term (current) use of inhaled steroids: Secondary | ICD-10-CM

## 2018-04-16 DIAGNOSIS — J449 Chronic obstructive pulmonary disease, unspecified: Secondary | ICD-10-CM | POA: Diagnosis not present

## 2018-04-16 DIAGNOSIS — Z79899 Other long term (current) drug therapy: Secondary | ICD-10-CM | POA: Diagnosis not present

## 2018-04-16 DIAGNOSIS — Z888 Allergy status to other drugs, medicaments and biological substances status: Secondary | ICD-10-CM | POA: Diagnosis not present

## 2018-04-16 DIAGNOSIS — K219 Gastro-esophageal reflux disease without esophagitis: Secondary | ICD-10-CM | POA: Diagnosis not present

## 2018-04-16 DIAGNOSIS — M1712 Unilateral primary osteoarthritis, left knee: Principal | ICD-10-CM | POA: Diagnosis present

## 2018-04-16 DIAGNOSIS — F419 Anxiety disorder, unspecified: Secondary | ICD-10-CM | POA: Diagnosis present

## 2018-04-16 DIAGNOSIS — Z87891 Personal history of nicotine dependence: Secondary | ICD-10-CM | POA: Diagnosis not present

## 2018-04-16 DIAGNOSIS — Z7982 Long term (current) use of aspirin: Secondary | ICD-10-CM

## 2018-04-16 DIAGNOSIS — M179 Osteoarthritis of knee, unspecified: Secondary | ICD-10-CM

## 2018-04-16 DIAGNOSIS — G8918 Other acute postprocedural pain: Secondary | ICD-10-CM | POA: Diagnosis not present

## 2018-04-16 HISTORY — PX: TOTAL KNEE ARTHROPLASTY: SHX125

## 2018-04-16 LAB — TYPE AND SCREEN
ABO/RH(D): A NEG
Antibody Screen: NEGATIVE

## 2018-04-16 SURGERY — ARTHROPLASTY, KNEE, TOTAL
Anesthesia: Spinal | Site: Knee | Laterality: Left

## 2018-04-16 MED ORDER — SODIUM CHLORIDE 0.9 % IJ SOLN
INTRAMUSCULAR | Status: DC | PRN
  Administered 2018-04-16: 60 mL

## 2018-04-16 MED ORDER — STERILE WATER FOR IRRIGATION IR SOLN
Status: DC | PRN
  Administered 2018-04-16: 2000 mL

## 2018-04-16 MED ORDER — METOCLOPRAMIDE HCL 5 MG PO TABS: 5.0000 mg | ORAL_TABLET | Freq: Three times a day (TID) | ORAL | Status: DC | PRN

## 2018-04-16 MED ORDER — LAMOTRIGINE 25 MG PO TABS
50.0000 mg | ORAL_TABLET | Freq: Every day | ORAL | Status: DC
  Administered 2018-04-17 – 2018-04-18 (×2): 50 mg via ORAL
  Filled 2018-04-16 (×2): qty 2

## 2018-04-16 MED ORDER — FENTANYL CITRATE (PF) 100 MCG/2ML IJ SOLN
INTRAMUSCULAR | Status: AC
  Filled 2018-04-16: qty 2

## 2018-04-16 MED ORDER — DEXAMETHASONE SODIUM PHOSPHATE 10 MG/ML IJ SOLN
8.0000 mg | Freq: Once | INTRAMUSCULAR | Status: AC
  Administered 2018-04-16: 10 mg via INTRAVENOUS

## 2018-04-16 MED ORDER — TIZANIDINE HCL 4 MG PO TABS
4.0000 mg | ORAL_TABLET | Freq: Four times a day (QID) | ORAL | Status: DC | PRN
  Administered 2018-04-17: 4 mg via ORAL
  Filled 2018-04-16: qty 1

## 2018-04-16 MED ORDER — DEXAMETHASONE SODIUM PHOSPHATE 10 MG/ML IJ SOLN
INTRAMUSCULAR | Status: AC
  Filled 2018-04-16: qty 1

## 2018-04-16 MED ORDER — TRANEXAMIC ACID 1000 MG/10ML IV SOLN
1000.0000 mg | INTRAVENOUS | Status: AC
  Administered 2018-04-16: 1000 mg via INTRAVENOUS
  Filled 2018-04-16: qty 10

## 2018-04-16 MED ORDER — MIDAZOLAM HCL 5 MG/5ML IJ SOLN
INTRAMUSCULAR | Status: DC | PRN
  Administered 2018-04-16: 1 mg via INTRAVENOUS

## 2018-04-16 MED ORDER — CHLORHEXIDINE GLUCONATE 4 % EX LIQD: 60.0000 mL | Freq: Once | CUTANEOUS | Status: DC

## 2018-04-16 MED ORDER — ONDANSETRON HCL 4 MG/2ML IJ SOLN
INTRAMUSCULAR | Status: AC
  Filled 2018-04-16: qty 2

## 2018-04-16 MED ORDER — BUPIVACAINE IN DEXTROSE 0.75-8.25 % IT SOLN
INTRATHECAL | Status: DC | PRN
  Administered 2018-04-16: 1.6 mL via INTRATHECAL

## 2018-04-16 MED ORDER — LACTATED RINGERS IV SOLN
INTRAVENOUS | Status: DC
  Administered 2018-04-16 (×2): via INTRAVENOUS

## 2018-04-16 MED ORDER — ONDANSETRON HCL 4 MG/2ML IJ SOLN: 4.0000 mg | Freq: Four times a day (QID) | INTRAMUSCULAR | Status: DC | PRN

## 2018-04-16 MED ORDER — TRANEXAMIC ACID 1000 MG/10ML IV SOLN: 1000.0000 mg | INTRAVENOUS | Status: DC

## 2018-04-16 MED ORDER — PROPOFOL 500 MG/50ML IV EMUL
INTRAVENOUS | Status: DC | PRN
  Administered 2018-04-16: 75 ug/kg/min via INTRAVENOUS

## 2018-04-16 MED ORDER — FLEET ENEMA 7-19 GM/118ML RE ENEM
1.0000 | ENEMA | Freq: Once | RECTAL | Status: DC | PRN
Start: 2018-04-16 — End: 2018-04-18

## 2018-04-16 MED ORDER — SODIUM CHLORIDE 0.9 % IR SOLN
Status: DC | PRN
  Administered 2018-04-16: 1000 mL

## 2018-04-16 MED ORDER — PROPOFOL 10 MG/ML IV BOLUS
INTRAVENOUS | Status: AC
  Filled 2018-04-16: qty 40

## 2018-04-16 MED ORDER — DEXAMETHASONE SODIUM PHOSPHATE 10 MG/ML IJ SOLN
10.0000 mg | Freq: Once | INTRAMUSCULAR | Status: AC
  Administered 2018-04-17: 10 mg via INTRAVENOUS
  Filled 2018-04-16: qty 1

## 2018-04-16 MED ORDER — TRANEXAMIC ACID 1000 MG/10ML IV SOLN
1000.0000 mg | Freq: Once | INTRAVENOUS | Status: AC
  Administered 2018-04-16: 1000 mg via INTRAVENOUS
  Filled 2018-04-16: qty 1000

## 2018-04-16 MED ORDER — METOCLOPRAMIDE HCL 5 MG/ML IJ SOLN: 5.0000 mg | Freq: Three times a day (TID) | INTRAMUSCULAR | Status: DC | PRN

## 2018-04-16 MED ORDER — SODIUM CHLORIDE 0.9 % IV SOLN
INTRAVENOUS | Status: DC
  Administered 2018-04-16 – 2018-04-17 (×2): via INTRAVENOUS

## 2018-04-16 MED ORDER — MOMETASONE FURO-FORMOTEROL FUM 200-5 MCG/ACT IN AERO
2.0000 | INHALATION_SPRAY | Freq: Two times a day (BID) | RESPIRATORY_TRACT | Status: DC
Start: 2018-04-16 — End: 2018-04-18
  Administered 2018-04-16 – 2018-04-18 (×3): 2 via RESPIRATORY_TRACT
  Filled 2018-04-16: qty 8.8

## 2018-04-16 MED ORDER — SODIUM CHLORIDE 0.9 % IJ SOLN
INTRAMUSCULAR | Status: AC
  Filled 2018-04-16: qty 50

## 2018-04-16 MED ORDER — DOCUSATE SODIUM 100 MG PO CAPS
100.0000 mg | ORAL_CAPSULE | Freq: Two times a day (BID) | ORAL | Status: DC
  Administered 2018-04-16 – 2018-04-18 (×4): 100 mg via ORAL
  Filled 2018-04-16 (×4): qty 1

## 2018-04-16 MED ORDER — GABAPENTIN 300 MG PO CAPS
300.0000 mg | ORAL_CAPSULE | Freq: Three times a day (TID) | ORAL | Status: DC
  Administered 2018-04-16 – 2018-04-18 (×7): 300 mg via ORAL
  Filled 2018-04-16 (×7): qty 1

## 2018-04-16 MED ORDER — CEFAZOLIN SODIUM-DEXTROSE 2-4 GM/100ML-% IV SOLN
2.0000 g | Freq: Four times a day (QID) | INTRAVENOUS | Status: AC
  Administered 2018-04-16 (×2): 2 g via INTRAVENOUS
  Filled 2018-04-16 (×2): qty 100

## 2018-04-16 MED ORDER — HYDROMORPHONE HCL 1 MG/ML IJ SOLN
0.5000 mg | INTRAMUSCULAR | Status: DC | PRN
  Administered 2018-04-16 – 2018-04-18 (×7): 1 mg via INTRAVENOUS
  Filled 2018-04-16 (×7): qty 1

## 2018-04-16 MED ORDER — ONDANSETRON HCL 4 MG/2ML IJ SOLN
INTRAMUSCULAR | Status: DC | PRN
  Administered 2018-04-16: 4 mg via INTRAVENOUS

## 2018-04-16 MED ORDER — CEFAZOLIN SODIUM-DEXTROSE 2-4 GM/100ML-% IV SOLN
2.0000 g | INTRAVENOUS | Status: AC
  Administered 2018-04-16: 2 g via INTRAVENOUS
  Filled 2018-04-16: qty 100

## 2018-04-16 MED ORDER — ACETAMINOPHEN 500 MG PO TABS
1000.0000 mg | ORAL_TABLET | Freq: Four times a day (QID) | ORAL | Status: AC
  Administered 2018-04-16 – 2018-04-17 (×3): 1000 mg via ORAL
  Filled 2018-04-16 (×3): qty 2

## 2018-04-16 MED ORDER — LAMOTRIGINE 100 MG PO TABS
100.0000 mg | ORAL_TABLET | Freq: Every day | ORAL | Status: DC
  Administered 2018-04-16 – 2018-04-17 (×2): 100 mg via ORAL
  Filled 2018-04-16 (×2): qty 1

## 2018-04-16 MED ORDER — OXYCODONE HCL 5 MG PO TABS
5.0000 mg | ORAL_TABLET | ORAL | Status: DC | PRN
  Administered 2018-04-16: 5 mg via ORAL
  Administered 2018-04-16 – 2018-04-17 (×2): 10 mg via ORAL
  Administered 2018-04-17: 5 mg via ORAL
  Filled 2018-04-16 (×2): qty 1
  Filled 2018-04-16 (×2): qty 2

## 2018-04-16 MED ORDER — ALBUTEROL SULFATE HFA 108 (90 BASE) MCG/ACT IN AERS: 2.0000 | INHALATION_SPRAY | RESPIRATORY_TRACT | Status: DC | PRN

## 2018-04-16 MED ORDER — FENTANYL CITRATE (PF) 100 MCG/2ML IJ SOLN
INTRAMUSCULAR | Status: DC | PRN
  Administered 2018-04-16: 50 ug via INTRAVENOUS

## 2018-04-16 MED ORDER — FAMOTIDINE 20 MG PO TABS
20.0000 mg | ORAL_TABLET | Freq: Two times a day (BID) | ORAL | Status: DC
  Administered 2018-04-16 – 2018-04-18 (×4): 20 mg via ORAL
  Filled 2018-04-16 (×4): qty 1

## 2018-04-16 MED ORDER — POLYETHYLENE GLYCOL 3350 17 G PO PACK: 17.0000 g | PACK | Freq: Every day | ORAL | Status: DC | PRN

## 2018-04-16 MED ORDER — OXYCODONE HCL 5 MG PO TABS: 5.0000 mg | ORAL_TABLET | Freq: Once | ORAL | Status: DC | PRN

## 2018-04-16 MED ORDER — LAMOTRIGINE 25 MG PO TABS: 50.0000 mg | ORAL_TABLET | ORAL | Status: DC

## 2018-04-16 MED ORDER — MENTHOL 3 MG MT LOZG
1.0000 | LOZENGE | OROMUCOSAL | Status: DC | PRN
  Administered 2018-04-17: 3 mg via ORAL
  Filled 2018-04-16: qty 9

## 2018-04-16 MED ORDER — DICYCLOMINE HCL 20 MG PO TABS: 20.0000 mg | ORAL_TABLET | Freq: Four times a day (QID) | ORAL | Status: DC | PRN

## 2018-04-16 MED ORDER — ACETAMINOPHEN 10 MG/ML IV SOLN
1000.0000 mg | Freq: Four times a day (QID) | INTRAVENOUS | Status: DC
  Administered 2018-04-16: 1000 mg via INTRAVENOUS
  Filled 2018-04-16: qty 100

## 2018-04-16 MED ORDER — ALBUTEROL SULFATE (2.5 MG/3ML) 0.083% IN NEBU: 2.5000 mg | INHALATION_SOLUTION | RESPIRATORY_TRACT | Status: DC | PRN

## 2018-04-16 MED ORDER — BUPIVACAINE LIPOSOME 1.3 % IJ SUSP
INTRAMUSCULAR | Status: DC | PRN
  Administered 2018-04-16: 20 mL

## 2018-04-16 MED ORDER — SODIUM CHLORIDE 0.9 % IJ SOLN
INTRAMUSCULAR | Status: AC
  Filled 2018-04-16: qty 10

## 2018-04-16 MED ORDER — FENTANYL CITRATE (PF) 100 MCG/2ML IJ SOLN: 25.0000 ug | INTRAMUSCULAR | Status: DC | PRN

## 2018-04-16 MED ORDER — PRAVASTATIN SODIUM 20 MG PO TABS
80.0000 mg | ORAL_TABLET | Freq: Every day | ORAL | Status: DC
  Administered 2018-04-16 – 2018-04-17 (×2): 80 mg via ORAL
  Filled 2018-04-16 (×2): qty 4

## 2018-04-16 MED ORDER — ONDANSETRON HCL 4 MG/2ML IJ SOLN: 4.0000 mg | Freq: Once | INTRAMUSCULAR | Status: DC | PRN

## 2018-04-16 MED ORDER — OXYCODONE HCL 5 MG/5ML PO SOLN
5.0000 mg | Freq: Once | ORAL | Status: DC | PRN
  Filled 2018-04-16: qty 5

## 2018-04-16 MED ORDER — ASPIRIN EC 325 MG PO TBEC
325.0000 mg | DELAYED_RELEASE_TABLET | Freq: Two times a day (BID) | ORAL | Status: DC
  Administered 2018-04-17 – 2018-04-18 (×3): 325 mg via ORAL
  Filled 2018-04-16 (×3): qty 1

## 2018-04-16 MED ORDER — ROPIVACAINE HCL 7.5 MG/ML IJ SOLN
INTRAMUSCULAR | Status: DC | PRN
  Administered 2018-04-16: 20 mL via PERINEURAL

## 2018-04-16 MED ORDER — MIDAZOLAM HCL 2 MG/2ML IJ SOLN
INTRAMUSCULAR | Status: AC
  Filled 2018-04-16: qty 2

## 2018-04-16 MED ORDER — BISACODYL 10 MG RE SUPP: 10.0000 mg | Freq: Every day | RECTAL | Status: DC | PRN

## 2018-04-16 MED ORDER — VENLAFAXINE HCL ER 75 MG PO CP24
75.0000 mg | ORAL_CAPSULE | Freq: Two times a day (BID) | ORAL | Status: DC
  Administered 2018-04-16 – 2018-04-18 (×4): 75 mg via ORAL
  Filled 2018-04-16 (×5): qty 1

## 2018-04-16 MED ORDER — 0.9 % SODIUM CHLORIDE (POUR BTL) OPTIME
TOPICAL | Status: DC | PRN
  Administered 2018-04-16: 1000 mL

## 2018-04-16 MED ORDER — PHENOL 1.4 % MT LIQD
1.0000 | OROMUCOSAL | Status: DC | PRN
  Filled 2018-04-16: qty 177

## 2018-04-16 MED ORDER — ONDANSETRON HCL 4 MG PO TABS: 4.0000 mg | ORAL_TABLET | Freq: Four times a day (QID) | ORAL | Status: DC | PRN

## 2018-04-16 MED ORDER — DIPHENHYDRAMINE HCL 12.5 MG/5ML PO ELIX: 12.5000 mg | ORAL_SOLUTION | ORAL | Status: DC | PRN

## 2018-04-16 MED ORDER — OXYCODONE HCL 5 MG PO TABS
10.0000 mg | ORAL_TABLET | ORAL | Status: DC | PRN
  Administered 2018-04-16 – 2018-04-18 (×4): 15 mg via ORAL
  Filled 2018-04-16 (×5): qty 3

## 2018-04-16 MED ORDER — GABAPENTIN 300 MG PO CAPS
300.0000 mg | ORAL_CAPSULE | Freq: Once | ORAL | Status: AC
  Administered 2018-04-16: 300 mg via ORAL
  Filled 2018-04-16: qty 1

## 2018-04-16 MED ORDER — LORAZEPAM 1 MG PO TABS
1.5000 mg | ORAL_TABLET | Freq: Every day | ORAL | Status: DC
  Administered 2018-04-16 – 2018-04-17 (×2): 1.5 mg via ORAL
  Filled 2018-04-16 (×2): qty 1

## 2018-04-16 SURGICAL SUPPLY — 51 items
BAG ZIPLOCK 12X15 (MISCELLANEOUS) ×2 IMPLANT
BANDAGE ACE 6X5 VEL STRL LF (GAUZE/BANDAGES/DRESSINGS) ×2 IMPLANT
BLADE SAG 18X100X1.27 (BLADE) ×2 IMPLANT
BLADE SAW SGTL 11.0X1.19X90.0M (BLADE) ×2 IMPLANT
BOWL SMART MIX CTS (DISPOSABLE) ×2 IMPLANT
CEMENT HV SMART SET (Cement) ×4 IMPLANT
CEMENT TIBIA MBT SIZE 2.5 (Knees) ×1 IMPLANT
COVER SURGICAL LIGHT HANDLE (MISCELLANEOUS) ×2 IMPLANT
CUFF TOURN SGL QUICK 34 (TOURNIQUET CUFF) ×1
CUFF TRNQT CYL 34X4X40X1 (TOURNIQUET CUFF) ×1 IMPLANT
DECANTER SPIKE VIAL GLASS SM (MISCELLANEOUS) ×2 IMPLANT
DRAPE U-SHAPE 47X51 STRL (DRAPES) ×2 IMPLANT
DRSG ADAPTIC 3X8 NADH LF (GAUZE/BANDAGES/DRESSINGS) ×2 IMPLANT
DRSG PAD ABDOMINAL 8X10 ST (GAUZE/BANDAGES/DRESSINGS) ×2 IMPLANT
DURAPREP 26ML APPLICATOR (WOUND CARE) ×2 IMPLANT
ELECT REM PT RETURN 15FT ADLT (MISCELLANEOUS) ×2 IMPLANT
EVACUATOR 1/8 PVC DRAIN (DRAIN) ×2 IMPLANT
GAUZE SPONGE 4X4 12PLY STRL (GAUZE/BANDAGES/DRESSINGS) ×2 IMPLANT
GLOVE BIOGEL PI IND STRL 7.0 (GLOVE) ×2 IMPLANT
GLOVE BIOGEL PI IND STRL 8 (GLOVE) ×1 IMPLANT
GLOVE BIOGEL PI INDICATOR 7.0 (GLOVE) ×2
GLOVE BIOGEL PI INDICATOR 8 (GLOVE) ×1
GLOVE SURG SS PI 6.5 STRL IVOR (GLOVE) ×4 IMPLANT
GLOVE SURG SS PI 7.0 STRL IVOR (GLOVE) ×4 IMPLANT
GLOVE SURG SS PI 8.0 STRL IVOR (GLOVE) ×4 IMPLANT
GOWN STRL REUS W/TWL LRG LVL3 (GOWN DISPOSABLE) ×4 IMPLANT
HANDPIECE INTERPULSE COAX TIP (DISPOSABLE) ×1
HOLDER FOLEY CATH W/STRAP (MISCELLANEOUS) IMPLANT
IMMOBILIZER KNEE 20 (SOFTGOODS) ×4 IMPLANT
IMMOBILIZER KNEE 20 THIGH 36 (SOFTGOODS) ×1 IMPLANT
IMPL FEMUR SIGMA LT PS SZ 3 (Knees) ×1 IMPLANT
IMPLANT FEMUR SIGMA LT PS SZ 3 (Knees) ×2 IMPLANT
INSERT PFC SIG STB SZ3 15.0MM (Knees) ×2 IMPLANT
MANIFOLD NEPTUNE II (INSTRUMENTS) ×2 IMPLANT
NS IRRIG 1000ML POUR BTL (IV SOLUTION) ×2 IMPLANT
PACK TOTAL KNEE CUSTOM (KITS) ×2 IMPLANT
PADDING CAST COTTON 6X4 STRL (CAST SUPPLIES) ×4 IMPLANT
PATELLA DOME PFC 35MM (Knees) ×2 IMPLANT
POSITIONER SURGICAL ARM (MISCELLANEOUS) ×2 IMPLANT
SET HNDPC FAN SPRY TIP SCT (DISPOSABLE) ×1 IMPLANT
STRIP CLOSURE SKIN 1/2X4 (GAUZE/BANDAGES/DRESSINGS) ×4 IMPLANT
SUT MNCRL AB 4-0 PS2 18 (SUTURE) ×2 IMPLANT
SUT STRATAFIX 0 PDS 27 VIOLET (SUTURE) ×2
SUT VIC AB 2-0 CT1 27 (SUTURE) ×3
SUT VIC AB 2-0 CT1 TAPERPNT 27 (SUTURE) ×3 IMPLANT
SUTURE STRATFX 0 PDS 27 VIOLET (SUTURE) ×1 IMPLANT
TIBIA MBT CEMENT SIZE 2.5 (Knees) ×2 IMPLANT
TRAY FOLEY BAG SILVER LF 16FR (CATHETERS) ×2 IMPLANT
WATER STERILE IRR 1000ML POUR (IV SOLUTION) ×4 IMPLANT
WRAP KNEE MAXI GEL POST OP (GAUZE/BANDAGES/DRESSINGS) ×2 IMPLANT
YANKAUER SUCT BULB TIP 10FT TU (MISCELLANEOUS) ×2 IMPLANT

## 2018-04-16 NOTE — Anesthesia Procedure Notes (Signed)
Procedure Name: MAC Date/Time: 04/16/2018 7:07 AM Performed by: Maxwell Caul, CRNA Pre-anesthesia Checklist: Patient identified, Emergency Drugs available, Suction available and Patient being monitored Oxygen Delivery Method: Simple face mask

## 2018-04-16 NOTE — Anesthesia Procedure Notes (Signed)
Anesthesia Regional Block: Adductor canal block   Pre-Anesthetic Checklist: ,, timeout performed, Correct Patient, Correct Site, Correct Laterality, Correct Procedure, Correct Position, site marked, Risks and benefits discussed,  Surgical consent,  Pre-op evaluation,  At surgeon's request and post-op pain management  Laterality: Left  Prep: chloraprep       Needles:  Injection technique: Single-shot  Needle Type: Echogenic Needle     Needle Length: 9cm  Needle Gauge: 21     Additional Needles:   Narrative:  Start time: 04/16/2018 7:00 AM End time: 04/16/2018 7:04 AM Injection made incrementally with aspirations every 5 mL.  Performed by: Personally  Anesthesiologist: Audry Pili, MD  Additional Notes: No pain on injection. No increased resistance to injection. Injection made in 5cc increments. Good needle visualization. Patient tolerated the procedure well.

## 2018-04-16 NOTE — Op Note (Signed)
OPERATIVE REPORT-TOTAL KNEE ARTHROPLASTY   Pre-operative diagnosis- Osteoarthritis  Left knee(s)  Post-operative diagnosis- Osteoarthritis Left knee(s)  Procedure-  Left  Total Knee Arthroplasty  Surgeon- Dione Plover. Ysenia Filice, MD  Assistant- Theresa Duty, PA-C   Anesthesia-  Adductor canal block and spinal  EBL- 25 ml   Drains Hemovac  Tourniquet time-  Total Tourniquet Time Documented: Thigh (Left) - 36 minutes Total: Thigh (Left) - 36 minutes     Complications- None  Condition-PACU - hemodynamically stable.   Brief Clinical Note  Lauren [name] is a 67 y.o. year old female with end stage OA of her left knee with progressively worsening pain and dysfunction. She has constant pain, with activity and at rest and significant functional deficits with difficulties even with ADLs. She has had extensive non-op management including analgesics, injections of cortisone and viscosupplements, and home exercise program, but remains in significant pain with significant dysfunction. Radiographs show bone on bone arthritis medial and patellofemoral. She presents now for left Total Knee Arthroplasty.    Procedure in detail---   The patient is brought into the operating room and positioned supine on the operating table. After successful administration of  Adductor canal block and spinal,   a tourniquet is placed high on the  Left thigh(s) and the lower extremity is prepped and draped in the usual sterile fashion. Time out is performed by the operating team and then the  Left lower extremity is wrapped in Esmarch, knee flexed and the tourniquet inflated to 300 mmHg.       A midline incision is made with a ten blade through the subcutaneous tissue to the level of the extensor mechanism. A fresh blade is used to make a medial parapatellar arthrotomy. Soft tissue over the proximal medial tibia is subperiosteally elevated to the joint line with a knife and into the semimembranosus bursa with a Cobb  elevator. Soft tissue over the proximal lateral tibia is elevated with attention being paid to avoiding the patellar tendon on the tibial tubercle. The patella is everted, knee flexed 90 degrees and the ACL and PCL are removed. Findings are bone on bone medial and patellofemoral with large global osteophytes.        The drill is used to create a starting hole in the distal femur and the canal is thoroughly irrigated with sterile saline to remove the fatty contents. The 5 degree Left  valgus alignment guide is placed into the femoral canal and the distal femoral cutting block is pinned to remove 10 mm off the distal femur. Resection is made with an oscillating saw.      The tibia is subluxed forward and the menisci are removed. The extramedullary alignment guide is placed referencing proximally at the medial aspect of the tibial tubercle and distally along the second metatarsal axis and tibial crest. The block is pinned to remove 56mm off the more deficient medial  side. Resection is made with an oscillating saw. Size 2.5is the most appropriate size for the tibia and the proximal tibia is prepared with the modular drill and keel punch for that size.      The femoral sizing guide is placed and size 3 is most appropriate. Rotation is marked off the epicondylar axis and confirmed by creating a rectangular flexion gap at 90 degrees. The size 3 cutting block is pinned in this rotation and the anterior, posterior and chamfer cuts are made with the oscillating saw. The intercondylar block is then placed and that cut is made.  Trial size 2.5 tibial component, trial size 3 posterior stabilized femur and a 15  mm posterior stabilized rotating platform insert trial is placed. Full extension is achieved with excellent varus/valgus and anterior/posterior balance throughout full range of motion. The patella is everted and thickness measured to be 22  mm. Free hand resection is taken to 12 mm, a 35 template is placed, lug  holes are drilled, trial patella is placed, and it tracks normally. Osteophytes are removed off the posterior femur with the trial in place. All trials are removed and the cut bone surfaces prepared with pulsatile lavage. Cement is mixed and once ready for implantation, the size 2.5 tibial implant, size  3 posterior stabilized femoral component, and the size 35 patella are cemented in place and the patella is held with the clamp. The trial insert is placed and the knee held in full extension. The Exparel (20 ml mixed with 60 ml saline) is injected into the extensor mechanism, posterior capsule, medial and lateral gutters and subcutaneous tissues.  All extruded cement is removed and once the cement is hard the permanent 15 mm posterior stabilized rotating platform insert is placed into the tibial tray.      The wound is copiously irrigated with saline solution and the extensor mechanism closed over a hemovac drain with #1 V-loc suture. The tourniquet is released for a total tourniquet time of 36  minutes. Flexion against gravity is 140 degrees and the patella tracks normally. Subcutaneous tissue is closed with 2.0 vicryl and subcuticular with running 4.0 Monocryl. The incision is cleaned and dried and steri-strips and a bulky sterile dressing are applied. The limb is placed into a knee immobilizer and the patient is awakened and transported to recovery in stable condition.      Please note that a surgical assistant was a medical necessity for this procedure in order to perform it in a safe and expeditious manner. Surgical assistant was necessary to retract the ligaments and vital neurovascular structures to prevent injury to them and also necessary for proper positioning of the limb to allow for anatomic placement of the prosthesis.   Dione Plover Marypat Kimmet, MD    04/16/2018, 8:15 AM

## 2018-04-16 NOTE — Transfer of Care (Signed)
Immediate Anesthesia Transfer of Care Note  Patient: Lauren [name]  Procedure(s) Performed: LEFT TOTAL KNEE ARTHROPLASTY (Left Knee)  Patient Location: PACU  Anesthesia Type:Spinal  Level of Consciousness: awake, alert  and oriented  Airway & Oxygen Therapy: Patient Spontanous Breathing and Patient connected to face mask oxygen  Post-op Assessment: Report given to RN and Post -op Vital signs reviewed and stable  Post vital signs: Reviewed and stable  Last Vitals:  Vitals Value Taken Time  BP 124/81 04/16/2018  8:46 AM  Temp    Pulse 82 04/16/2018  8:47 AM  Resp 14 04/16/2018  8:47 AM  SpO2 97 % 04/16/2018  8:47 AM  Vitals shown include unvalidated device data.  Last Pain:  Vitals:   04/16/18 0628  TempSrc:   PainSc: 4       Patients Stated Pain Goal: 4 (48/35/07 5732)  Complications: No apparent anesthesia complications

## 2018-04-16 NOTE — Plan of Care (Signed)

## 2018-04-16 NOTE — Discharge Instructions (Signed)
° °Dr. Frank Aluisio °Total Joint Specialist °[facility] °[address] °[place], [place] [address] °[phone] ° °TOTAL KNEE REPLACEMENT POSTOPERATIVE DIRECTIONS ° °Knee Rehabilitation, Guidelines Following Surgery  °Results after knee surgery are often greatly improved when you follow the exercise, range of motion and muscle strengthening exercises prescribed by your doctor. Safety measures are also important to protect the knee from further injury. Any time any of these exercises cause you to have increased pain or swelling in your knee joint, decrease the amount until you are comfortable again and slowly increase them. If you have problems or questions, call your caregiver or physical therapist for advice.  ° °HOME CARE INSTRUCTIONS  °• Remove items at home which could result in a fall. This includes throw rugs or furniture in walking pathways.  °· ICE to the affected knee every three hours for 30 minutes at a time and then as needed for pain and swelling.  Continue to use ice on the knee for pain and swelling from surgery. You may notice swelling that will progress down to the foot and ankle.  This is normal after surgery.  Elevate the leg when you are not up walking on it.   °· Continue to use the breathing machine which will help keep your temperature down.  It is common for your temperature to cycle up and down following surgery, especially at night when you are not up moving around and exerting yourself.  The breathing machine keeps your lungs expanded and your temperature down. °· Do not place pillow under knee, focus on keeping the knee straight while resting ° °DIET °You may resume your previous home diet once your are discharged from the hospital. ° °DRESSING / WOUND CARE / SHOWERING °You may shower 3 days after surgery, but keep the wounds dry during showering.  You may use an occlusive plastic wrap (Press'n Seal for example), NO SOAKING/SUBMERGING IN THE BATHTUB.  If the bandage  gets wet, change with a clean dry gauze.  If the incision gets wet, pat the wound dry with a clean towel. °You may start showering once you are discharged home but do not submerge the incision under water. Just pat the incision dry and apply a dry gauze dressing on daily. °Change the surgical dressing daily and reapply a dry dressing each time. ° °ACTIVITY °Walk with your walker as instructed. °Use walker as long as suggested by your caregivers. °Avoid periods of inactivity such as sitting longer than an hour when not asleep. This helps prevent blood clots.  °You may resume a sexual relationship in one month or when given the OK by your doctor.  °You may return to work once you are cleared by your doctor.  °Do not drive a car for 6 weeks or until released by you surgeon.  °Do not drive while taking narcotics. ° °WEIGHT BEARING °Weight bearing as tolerated with assist device (walker, cane, etc) as directed, use it as long as suggested by your surgeon or therapist, typically at least 4-6 weeks. ° °POSTOPERATIVE CONSTIPATION PROTOCOL °Constipation - defined medically as fewer than three stools per week and severe constipation as less than one stool per week. ° °One of the most common issues patients have following surgery is constipation.  Even if you have a regular bowel pattern at home, your normal regimen is likely to be disrupted due to multiple reasons following surgery.  Combination of anesthesia, postoperative narcotics, change in appetite and fluid intake all can affect your bowels.    In order to avoid complications following surgery, here are some recommendations in order to help you during your recovery period. ° °Colace (docusate) - Pick up an over-the-counter form of Colace or another stool softener and take twice a day as long as you are requiring postoperative pain medications.  Take with a full glass of water daily.  If you experience loose stools or diarrhea, hold the colace until you stool forms back  up.  If your symptoms do not get better within 1 week or if they get worse, check with your doctor. ° °Dulcolax (bisacodyl) - Pick up over-the-counter and take as directed by the product packaging as needed to assist with the movement of your bowels.  Take with a full glass of water.  Use this product as needed if not relieved by Colace only.  ° °MiraLax (polyethylene glycol) - Pick up over-the-counter to have on hand.  MiraLax is a solution that will increase the amount of water in your bowels to assist with bowel movements.  Take as directed and can mix with a glass of water, juice, soda, coffee, or tea.  Take if you go more than two days without a movement. °Do not use MiraLax more than once per day. Call your doctor if you are still constipated or irregular after using this medication for 7 days in a row. ° °If you continue to have problems with postoperative constipation, please contact the office for further assistance and recommendations.  If you experience "the worst abdominal pain ever" or develop nausea or vomiting, please contact the office immediatly for further recommendations for treatment. ° °ITCHING ° If you experience itching with your medications, try taking only a single pain pill, or even half a pain pill at a time.  You can also use Benadryl over the counter for itching or also to help with sleep.  ° °TED HOSE STOCKINGS °Wear the elastic stockings on both legs for three weeks following surgery during the day but you may remove then at night for sleeping. ° °MEDICATIONS °See your medication summary on the “After Visit Summary” that the nursing staff will review with you prior to discharge.  You may have some home medications which will be placed on hold until you complete the course of blood thinner medication.  It is important for you to complete the blood thinner medication as prescribed by your surgeon.  Continue your approved medications as instructed at time of discharge. ° °PRECAUTIONS °If  you experience chest pain or shortness of breath - call 911 immediately for transfer to the hospital emergency department.  °If you develop a fever greater that 101 F, purulent drainage from wound, increased redness or drainage from wound, foul odor from the wound/dressing, or calf pain - CONTACT YOUR SURGEON.   °                                                °FOLLOW-UP APPOINTMENTS °Make sure you keep all of your appointments after your operation with your surgeon and caregivers. You should call the office at the above phone number and make an appointment for approximately two weeks after the date of your surgery or on the date instructed by your surgeon outlined in the "After Visit Summary". ° ° °RANGE OF MOTION AND STRENGTHENING EXERCISES  °Rehabilitation of the knee is important following a knee injury or   an operation. After just a few days of immobilization, the muscles of the thigh which control the knee become weakened and shrink (atrophy). Knee exercises are designed to build up the tone and strength of the thigh muscles and to improve knee motion. Often times heat used for twenty to thirty minutes before working out will loosen up your tissues and help with improving the range of motion but do not use heat for the first two weeks following surgery. These exercises can be done on a training (exercise) mat, on the floor, on a table or on a bed. Use what ever works the best and is most comfortable for you Knee exercises include:  °• Leg Lifts - While your knee is still immobilized in a splint or cast, you can do straight leg raises. Lift the leg to 60 degrees, hold for 3 sec, and slowly lower the leg. Repeat 10-20 times 2-3 times daily. Perform this exercise against resistance later as your knee gets better.  °• Quad and Hamstring Sets - Tighten up the muscle on the front of the thigh (Quad) and hold for 5-10 sec. Repeat this 10-20 times hourly. Hamstring sets are done by pushing the foot backward against an  object and holding for 5-10 sec. Repeat as with quad sets.  °· Leg Slides: Lying on your back, slowly slide your foot toward your buttocks, bending your knee up off the floor (only go as far as is comfortable). Then slowly slide your foot back down until your leg is flat on the floor again. °· Angel Wings: Lying on your back spread your legs to the side as far apart as you can without causing discomfort.  °A rehabilitation program following serious knee injuries can speed recovery and prevent re-injury in the future due to weakened muscles. Contact your doctor or a physical therapist for more information on knee rehabilitation.  ° °IF YOU ARE TRANSFERRED TO A SKILLED REHAB FACILITY °If the patient is transferred to a skilled rehab facility following release from the hospital, a list of the current medications will be sent to the facility for the patient to continue.  When discharged from the skilled rehab facility, please have the facility set up the patient's Home Health Physical Therapy prior to being released. Also, the skilled facility will be responsible for providing the patient with their medications at time of release from the facility to include their pain medication, the muscle relaxants, and their blood thinner medication. If the patient is still at the rehab facility at time of the two week follow up appointment, the skilled rehab facility will also need to assist the patient in arranging follow up appointment in our office and any transportation needs. ° °MAKE SURE YOU:  °• Understand these instructions.  °• Get help right away if you are not doing well or get worse.  ° ° °Pick up stool softner and laxative for home use following surgery while on pain medications. °Do not submerge incision under water. °Please use good hand washing techniques while changing dressing each day. °May shower starting three days after surgery. °Please use a clean towel to pat the incision dry following showers. °Continue to  use ice for pain and swelling after surgery. °Do not use any lotions or creams on the incision until instructed by your surgeon. ° °

## 2018-04-16 NOTE — Anesthesia Procedure Notes (Signed)
Spinal  Patient location during procedure: OR Start time: 04/16/2018 7:11 AM End time: 04/16/2018 7:16 AM Staffing Anesthesiologist: Audry Pili, MD Performed: anesthesiologist  Preanesthetic Checklist Completed: patient identified, surgical consent, pre-op evaluation, timeout performed, IV checked, risks and benefits discussed and monitors and equipment checked Spinal Block Patient position: sitting Prep: DuraPrep Patient monitoring: heart rate, cardiac monitor, continuous pulse ox and blood pressure Approach: midline Location: L3-4 Injection technique: single-shot Needle Needle type: Pencan  Needle gauge: 24 G Additional Notes Functioning IV was confirmed and monitors were applied. Sterile prep and drape, including hand hygiene, mask, and sterile gloves were used. The patient was positioned and the spine was prepped. The skin was anesthetized with lidocaine. Free flow of clear CSF was obtained prior to injecting local anesthetic into the CSF. The spinal needle aspirated freely following injection. The needle was carefully withdrawn. The patient tolerated the procedure well. Consent was obtained prior to the procedure with all questions answered and concerns addressed. Risks including, but not limited to, bleeding, infection, nerve damage, paralysis, failed block, inadequate analgesia, allergic reaction, high spinal, itching, and headache were discussed and the patient wished to proceed.  Renold Don, MD

## 2018-04-16 NOTE — Interval H&P Note (Signed)
History and Physical Interval Note:  04/16/2018 6:28 AM  Lauren [name]  has presented today for surgery, with the diagnosis of left knee osteoarthritis  The various methods of treatment have been discussed with the patient and family. After consideration of risks, benefits and other options for treatment, the patient has consented to  Procedure(s): LEFT TOTAL KNEE ARTHROPLASTY (Left) as a surgical intervention .  The patient's history has been reviewed, patient examined, no change in status, stable for surgery.  I have reviewed the patient's chart and labs.  Questions were answered to the patient's satisfaction.     Pilar Plate Alberta Lenhard

## 2018-04-16 NOTE — Anesthesia Postprocedure Evaluation (Signed)
Anesthesia Post Note  Patient: Lauren [name]  Procedure(s) Performed: LEFT TOTAL KNEE ARTHROPLASTY (Left Knee)     Patient location during evaluation: PACU Anesthesia Type: Spinal Level of consciousness: awake and alert Pain management: pain level controlled Vital Signs Assessment: post-procedure vital signs reviewed and stable Respiratory status: spontaneous breathing and respiratory function stable Cardiovascular status: blood pressure returned to baseline and stable Postop Assessment: spinal receding and no apparent nausea or vomiting Anesthetic complications: no    Last Vitals:  Vitals:   04/16/18 0947 04/16/18 1053  BP: 126/78 138/90  Pulse: 78 73  Resp: 16   Temp: 36.4 C 36.6 C  SpO2: 96% 98%    Last Pain:  Vitals:   04/16/18 1204  TempSrc:   PainSc: [name]

## 2018-04-16 NOTE — Evaluation (Signed)
Physical Therapy Evaluation Patient Details Name: Lauren [name] MRN: [id number] DOB: 05/23/1951 Today's Date: 04/16/2018   History of Present Illness  67 yo female s/p L TKA 04/16/18. Hx of MDD, fibromyalgia, Hep C, vertigo, T9 comp fx.   Clinical Impression  On eval POD 0, pt required Min assist for mobility. She walked ~40 feet with a RW. Pain rated 8/10 with activity. Will follow and progress activity as tolerated.     Follow Up Recommendations Follow surgeon's recommendation for DC plan and follow-up therapies    Equipment Recommendations       Recommendations for Other Services       Precautions / Restrictions Precautions Precautions: Fall Required Braces or Orthoses: Knee Immobilizer - Left Knee Immobilizer - Left: Discontinue once straight leg raise with < 10 degree lag Restrictions Weight Bearing Restrictions: No Other Position/Activity Restrictions: WBAT      Mobility  Bed Mobility Overal bed mobility: Needs Assistance Bed Mobility: Supine to Sit     Supine to sit: Min assist;HOB elevated     General bed mobility comments: Assist for L LE. Increased time. Cues for safety, technique.   Transfers Overall transfer level: Needs assistance Equipment used: Rolling walker (2 wheeled) Transfers: Sit to/from Stand Sit to Stand: Min assist;From elevated surface         General transfer comment: Assist to rise, stabilize, position L LE, control descent. VCS safety, technique, hand/LE placement.   Ambulation/Gait Ambulation/Gait assistance: Min assist Gait Distance (Feet): 40 Feet Assistive device: Rolling walker (2 wheeled) Gait Pattern/deviations: Step-to pattern     General Gait Details: VCs safety, sequence, proper use of RW. Assist to stabilize pt throughout distance. Followed with recliner for safety. Pt denied lightheadedness.   Stairs            Wheelchair Mobility    Modified Rankin (Stroke Patients Only)       Balance Overall balance  assessment: Needs assistance         Standing balance support: Bilateral upper extremity supported Standing balance-Leahy Scale: Poor                               Pertinent Vitals/Pain Pain Assessment: 0-10 Pain Score: 8  Pain Location: L knee Pain Descriptors / Indicators: Aching;Sore;Discomfort;Sharp Pain Intervention(s): Limited activity within patient's tolerance;Repositioned;Ice applied    Home Living Family/patient expects to be discharged to:: Private residence Living Arrangements: Spouse/significant other Available Help at Discharge: Family;Friend(s) Type of Home: House Home Access: Stairs to enter Entrance Stairs-Rails: None Entrance Stairs-Number of Steps: 1 Home Layout: One level Home Equipment: Environmental consultant - 2 wheels;Cane - single point      Prior Function Level of Independence: Independent               Hand Dominance        Extremity/Trunk Assessment   Upper Extremity Assessment Upper Extremity Assessment: Overall WFL for tasks assessed    Lower Extremity Assessment Lower Extremity Assessment: LLE deficits/detail(s/p L TKA) LLE Deficits / Details: moves ankle well. Able to SLR.     Cervical / Trunk Assessment Cervical / Trunk Assessment: Normal  Communication   Communication: No difficulties  Cognition Arousal/Alertness: Awake/alert Behavior During Therapy: WFL for tasks assessed/performed Overall Cognitive Status: Within Functional Limits for tasks assessed  General Comments      Exercises     Assessment/Plan    PT Assessment Patient needs continued PT services  PT Problem List Decreased strength;Decreased mobility;Decreased range of motion;Decreased activity tolerance;Decreased balance;Decreased knowledge of use of DME;Pain;Decreased knowledge of precautions       PT Treatment Interventions DME instruction;Gait training;Functional mobility training;Therapeutic  activities;Balance training;Patient/family education;Therapeutic exercise;Stair training    PT Goals (Current goals can be found in the Care Plan section)  Acute Rehab PT Goals Patient Stated Goal: less pain. regain PLOF.  PT Goal Formulation: With patient/family Time For Goal Achievement: 04/30/18 Potential to Achieve Goals: Good    Frequency 7X/week   Barriers to discharge        Co-evaluation               AM-PAC PT "6 Clicks" Daily Activity  Outcome Measure Difficulty turning over in bed (including adjusting bedclothes, sheets and blankets)?: A Little Difficulty moving from lying on back to sitting on the side of the bed? : Unable Difficulty sitting down on and standing up from a chair with arms (e.g., wheelchair, bedside commode, etc,.)?: Unable Help needed moving to and from a bed to chair (including a wheelchair)?: A Little Help needed walking in hospital room?: A Little Help needed climbing 3-5 steps with a railing? : A Lot 6 Click Score: 13    End of Session Equipment Utilized During Treatment: Gait belt;Left knee immobilizer Activity Tolerance: Patient limited by pain Patient left: in chair;with call bell/phone within reach;with family/visitor present   PT Visit Diagnosis: Pain;Other abnormalities of gait and mobility (R26.89);Difficulty in walking, not elsewhere classified (R26.2) Pain - Right/Left: Left Pain - part of body: Knee    Time: 1354-1410 PT Time Calculation (min) (ACUTE ONLY): 16 min   Charges:   PT Evaluation $PT Eval Low Complexity: 1 Low            Weston Anna, MPT Pager: [phone]

## 2018-04-17 ENCOUNTER — Encounter (HOSPITAL_COMMUNITY): Payer: Self-pay | Admitting: Orthopedic Surgery

## 2018-04-17 LAB — CBC
HEMATOCRIT: 33.9 % — AB (ref 36.0–46.0)
Hemoglobin: 11 g/dL — ABNORMAL LOW (ref 12.0–15.0)
MCH: 28.9 pg (ref 26.0–34.0)
MCHC: 32.4 g/dL (ref 30.0–36.0)
MCV: 89.2 fL (ref 78.0–100.0)
Platelets: 180 10*3/uL (ref 150–400)
RBC: 3.8 MIL/uL — ABNORMAL LOW (ref 3.87–5.11)
RDW: 12.6 % (ref 11.5–15.5)
WBC: 7.5 10*3/uL (ref 4.0–10.5)

## 2018-04-17 LAB — BASIC METABOLIC PANEL
Anion gap: 11 (ref 5–15)
BUN: 11 mg/dL (ref 8–23)
CALCIUM: 9 mg/dL (ref 8.9–10.3)
CO2: 28 mmol/L (ref 22–32)
CREATININE: 0.68 mg/dL (ref 0.44–1.00)
Chloride: 100 mmol/L (ref 98–111)
GFR calc Af Amer: >60 mL/min (ref >60)
GFR calc non Af Amer: >60 mL/min (ref >60)
GLUCOSE: 162 mg/dL — AB (ref 70–99)
Potassium: 3.7 mmol/L (ref 3.5–5.1)
Sodium: 139 mmol/L (ref 135–145)

## 2018-04-17 MED ORDER — VENLAFAXINE HCL 75 MG PO TABS: 150.0000 mg | ORAL_TABLET | Freq: Every day | ORAL | Status: DC

## 2018-04-17 MED ORDER — VENLAFAXINE HCL 75 MG PO TABS
75.0000 mg | ORAL_TABLET | Freq: Once | ORAL | Status: DC
  Administered 2018-04-17: 75 mg via ORAL

## 2018-04-17 MED ORDER — VENLAFAXINE HCL ER 150 MG PO CP24
150.0000 mg | ORAL_CAPSULE | Freq: Every day | ORAL | Status: DC
  Administered 2018-04-17: 150 mg via ORAL
  Filled 2018-04-17: qty 1

## 2018-04-17 MED ORDER — VENLAFAXINE HCL ER 75 MG PO CP24
75.0000 mg | ORAL_CAPSULE | ORAL | Status: AC
  Administered 2018-04-17: 75 mg via ORAL
  Filled 2018-04-17: qty 1

## 2018-04-17 NOTE — Progress Notes (Signed)
Physical Therapy Treatment Patient Details Name: Lauren [name] MRN: [id number] DOB: 24-Jul-1951 Today's Date: 04/17/2018    History of Present Illness 67 yo female s/p L TKA 04/16/18. Hx of MDD, fibromyalgia, Hep C, vertigo, T9 comp fx.     PT Comments    Progressing with mobility. Pt c/o some lightheadedness towards end of session   Follow Up Recommendations  Follow surgeon's recommendation for DC plan and follow-up therapies     Equipment Recommendations  None recommended by PT    Recommendations for Other Services       Precautions / Restrictions Precautions Precautions: Fall Required Braces or Orthoses: Knee Immobilizer - Left Knee Immobilizer - Left: Discontinue once straight leg raise with < 10 degree lag Restrictions Weight Bearing Restrictions: No Other Position/Activity Restrictions: WBAT    Mobility  Bed Mobility               General bed mobility comments: oob in recliner  Transfers Overall transfer level: Needs assistance Equipment used: Rolling walker (2 wheeled) Transfers: Sit to/from Stand Sit to Stand: Min assist         General transfer comment: Assist to rise, stabilize, position L LE, control descent. VCS safety, technique, hand/LE placement.   Ambulation/Gait Ambulation/Gait assistance: Min assist Gait Distance (Feet): 75 Feet Assistive device: Rolling walker (2 wheeled) Gait Pattern/deviations: Step-to pattern     General Gait Details: VCs safety, sequence, proper use of RW, posture, distance from RW. Assist to stabilize pt throughout distance. Pt c/o some lightheadedness towards end of distance.    Stairs             Wheelchair Mobility    Modified Rankin (Stroke Patients Only)       Balance                                            Cognition Arousal/Alertness: Awake/alert Behavior During Therapy: WFL for tasks assessed/performed Overall Cognitive Status: Within Functional Limits for tasks  assessed                                        Exercises Total Joint Exercises Ankle Circles/Pumps: AROM;Both;10 reps;Seated Quad Sets: AROM;Both;10 reps;Seated Heel Slides: AAROM;Left;10 reps;Seated Hip ABduction/ADduction: AAROM;Left;10 reps;Seated Straight Leg Raises: AAROM;Left;AROM;10 reps;Seated Goniometric ROM: ~10-55 degrees    General Comments        Pertinent Vitals/Pain Pain Assessment: 0-10 Pain Score: 7  Pain Location: L knee Pain Descriptors / Indicators: Aching;Sore;Discomfort;Sharp Pain Intervention(s): Monitored during session;Repositioned;Ice applied    Home Living                      Prior Function            PT Goals (current goals can now be found in the care plan section) Progress towards PT goals: Progressing toward goals    Frequency    7X/week      PT Plan Current plan remains appropriate    Co-evaluation              AM-PAC PT "6 Clicks" Daily Activity  Outcome Measure  Difficulty turning over in bed (including adjusting bedclothes, sheets and blankets)?: A Little Difficulty moving from lying on back to sitting on the side of the bed? : Unable Difficulty  sitting down on and standing up from a chair with arms (e.g., wheelchair, bedside commode, etc,.)?: Unable Help needed moving to and from a bed to chair (including a wheelchair)?: A Little Help needed walking in hospital room?: A Little Help needed climbing 3-5 steps with a railing? : A Lot 6 Click Score: 13    End of Session Equipment Utilized During Treatment: Gait belt;Left knee immobilizer Activity Tolerance: Patient limited by pain Patient left: in chair;with call bell/phone within reach;with family/visitor present   PT Visit Diagnosis: Pain;Other abnormalities of gait and mobility (R26.89);Difficulty in walking, not elsewhere classified (R26.2) Pain - Right/Left: Left Pain - part of body: Knee     Time: 6503-5465 PT Time Calculation  (min) (ACUTE ONLY): 19 min  Charges:  $Gait Training: 8-22 mins                        Weston Anna, MPT Pager: [phone]

## 2018-04-17 NOTE — Progress Notes (Signed)
Physical Therapy Treatment Patient Details Name: Lauren [name] MRN: [id number] DOB: [birth year] Today's Date: 04/17/2018    History of Present Illness 67 yo female s/p L TKA 04/16/18. Hx of MDD, fibromyalgia, Hep C, vertigo, T9 comp fx.     PT Comments    Ambulation distance limited due to pain this afternoon. Will continue to progress pt as able. Plan is for d/c home on tomorrow.    Follow Up Recommendations  Follow surgeon's recommendation for DC plan and follow-up therapies     Equipment Recommendations  None recommended by PT    Recommendations for Other Services       Precautions / Restrictions Precautions Precautions: Fall Required Braces or Orthoses: Knee Immobilizer - Left Knee Immobilizer - Left: Discontinue once straight leg raise with < 10 degree lag Restrictions Weight Bearing Restrictions: No Other Position/Activity Restrictions: WBAT    Mobility  Bed Mobility Overal bed mobility: Needs Assistance Bed Mobility: Sit to Supine       Sit to supine: Min assist;HOB elevated   General bed mobility comments: Assist for L LE.   Transfers Overall transfer level: Needs assistance Equipment used: Rolling walker (2 wheeled) Transfers: Sit to/from Stand Sit to Stand: Min assist         General transfer comment: Assist to rise, stabilize, position L LE, control descent. VCS safety, technique, hand/LE placement.   Ambulation/Gait Ambulation/Gait assistance: Min assist Gait Distance (Feet): 60 Feet Assistive device: Rolling walker (2 wheeled) Gait Pattern/deviations: Step-to pattern;Trunk flexed     General Gait Details: VCs safety, sequence, proper use of RW, posture, distance from RW. Assist to stabilize pt throughout distance-very shaky. Distance limited by pain   Stairs             Wheelchair Mobility    Modified Rankin (Stroke Patients Only)       Balance           Standing balance support: Bilateral upper extremity  supported Standing balance-Leahy Scale: Poor                              Cognition Arousal/Alertness: Awake/alert Behavior During Therapy: WFL for tasks assessed/performed Overall Cognitive Status: Within Functional Limits for tasks assessed                                        Exercises    General Comments        Pertinent Vitals/Pain Pain Assessment: 0-10 Pain Score: 8  Pain Location: L knee Pain Descriptors / Indicators: Aching;Sore;Discomfort;Sharp Pain Intervention(s): RN gave pain meds during session;Ice applied;Repositioned    Home Living                      Prior Function            PT Goals (current goals can now be found in the care plan section) Progress towards PT goals: Progressing toward goals    Frequency    7X/week      PT Plan Current plan remains appropriate    Co-evaluation              AM-PAC PT "6 Clicks" Daily Activity  Outcome Measure  Difficulty turning over in bed (including adjusting bedclothes, sheets and blankets)?: A Little Difficulty moving from lying on back to sitting on the side of the bed? :  Unable Difficulty sitting down on and standing up from a chair with arms (e.g., wheelchair, bedside commode, etc,.)?: Unable Help needed moving to and from a bed to chair (including a wheelchair)?: A Little Help needed walking in hospital room?: A Little Help needed climbing 3-5 steps with a railing? : A Lot 6 Click Score: 13    End of Session Equipment Utilized During Treatment: Gait belt;Left knee immobilizer Activity Tolerance: Patient limited by pain Patient left: in bed;with call bell/phone within reach;with family/visitor present   PT Visit Diagnosis: Pain;Other abnormalities of gait and mobility (R26.89);Difficulty in walking, not elsewhere classified (R26.2) Pain - Right/Left: Left Pain - part of body: Knee     Time: 2549-8264 PT Time Calculation (min) (ACUTE ONLY): 17  min  Charges:  $Gait Training: 8-22 mins                        Weston Anna, MPT Pager: [phone]

## 2018-04-17 NOTE — Progress Notes (Signed)
   Subjective: 1 Day Post-Op Procedure(s) (LRB): LEFT TOTAL KNEE ARTHROPLASTY (Left) Patient reports pain as mild.   Patient seen in rounds by Dr. Wynelle Link. Patient is well, and has had no acute complaints or problems other than pain in the left knee. Did well ambulating with therapy yesterday. Foley catheter removed this AM. Denies chest pain, SOB, or calf pain. We will continue therapy today.   Objective: Vital signs in last 24 hours: Temp:  [97.6 F (36.4 C)-98.4 F (36.9 C)] 98.1 F (36.7 C) (08/13 0540) Pulse Rate:  [72-88] 87 (08/13 0540) Resp:  [12-21] 17 (08/13 0540) BP: (109-138)/(67-90) 109/67 (08/13 0540) SpO2:  [87 %-98 %] 87 % (08/13 0540)  Intake/Output from previous day:  Intake/Output Summary (Last 24 hours) at 04/17/2018 0702 Last data filed at 04/17/2018 0600 Gross per 24 hour  Intake 3600 ml  Output 6042 ml  Net -2442 ml    Labs: Recent Labs    04/17/18 0514  HGB 11.0*   Recent Labs    04/17/18 0514  WBC 7.5  RBC 3.80*  HCT 33.9*  PLT 180   Recent Labs    04/17/18 0514  NA 139  K 3.7  CL 100  CO2 28  BUN 11  CREATININE 0.68  GLUCOSE 162*  CALCIUM 9.0   Exam: General - Patient is Alert and Oriented Extremity - Neurologically intact Neurovascular intact Sensation intact distally Dorsiflexion/Plantar flexion intact Dressing - dressing C/D/I Motor Function - intact, moving foot and toes well on exam.   Past Medical History:  Diagnosis Date  . Anxiety   . Chronic pain   . COPD (chronic obstructive pulmonary disease) ([place])   . Depression   . Fibromyalgia   . Fibromyalgia   . GERD (gastroesophageal reflux disease)   . Hepatitis C   . History of blood transfusion   . History of bronchitis   . IBS (irritable bowel syndrome)   . Osteoarthritis   . Pneumonia   . Prediabetes   . Shortness of breath dyspnea    allergies; increased pain;   . Vertigo   . Wears glasses     Assessment/Plan: 1 Day Post-Op Procedure(s) (LRB): LEFT  TOTAL KNEE ARTHROPLASTY (Left) Principal Problem:   OA (osteoarthritis) of knee  Estimated body mass index is 24.69 kg/m as calculated from the following:   Height as of this encounter: 5\' 6"  (1.676 m).   Weight as of this encounter: 69.4 kg. Advance diet Up with therapy  Anticipated LOS equal to or greater than 2 midnights due to - Age 9 and older with one or more of the following:  - Obesity  - Expected need for hospital services (PT, OT, Nursing) required for safe  discharge  - Anticipated need for postoperative skilled nursing care or inpatient rehab  - Active co-morbidities: Respiratory Failure/COPD OR   - Unanticipated findings during/Post Surgery: None  - Patient is a high risk of re-admission due to: None    DVT Prophylaxis - Aspirin Weight bearing as tolerated. D/C O2 and pulse ox and try on room air. Hemovac pulled without difficulty, will continue therapy today.  Plan is to go Home after hospital stay. Possible discharge tomorrow if progresses with therapy and meeting goals.  Theresa Duty, PA-C Orthopedic Surgery 04/17/2018, 7:02 AM

## 2018-04-18 LAB — BASIC METABOLIC PANEL
Anion gap: 7 (ref 5–15)
BUN: 11 mg/dL (ref 8–23)
CHLORIDE: 101 mmol/L (ref 98–111)
CO2: 30 mmol/L (ref 22–32)
CREATININE: 0.56 mg/dL (ref 0.44–1.00)
Calcium: 8.9 mg/dL (ref 8.9–10.3)
GFR calc non Af Amer: >60 mL/min (ref >60)
Glucose, Bld: 167 mg/dL — ABNORMAL HIGH (ref 70–99)
Potassium: 3.7 mmol/L (ref 3.5–5.1)
SODIUM: 138 mmol/L (ref 135–145)

## 2018-04-18 LAB — CBC
HCT: 32.1 % — ABNORMAL LOW (ref 36.0–46.0)
HEMOGLOBIN: 10.4 g/dL — AB (ref 12.0–15.0)
MCH: 28.7 pg (ref 26.0–34.0)
MCHC: 32.4 g/dL (ref 30.0–36.0)
MCV: 88.4 fL (ref 78.0–100.0)
Platelets: 178 10*3/uL (ref 150–400)
RBC: 3.63 MIL/uL — ABNORMAL LOW (ref 3.87–5.11)
RDW: 12.8 % (ref 11.5–15.5)
WBC: 8.2 10*3/uL (ref 4.0–10.5)

## 2018-04-18 MED ORDER — ASPIRIN 325 MG PO TBEC: 325.0000 mg | DELAYED_RELEASE_TABLET | Freq: Two times a day (BID) | ORAL | 0 refills | Status: AC

## 2018-04-18 MED ORDER — OXYCODONE HCL 5 MG PO TABS: 5.0000 mg | ORAL_TABLET | Freq: Four times a day (QID) | ORAL | 0 refills | Status: DC | PRN

## 2018-04-18 MED ORDER — TIZANIDINE HCL 4 MG PO TABS: 4.0000 mg | ORAL_TABLET | Freq: Four times a day (QID) | ORAL | 0 refills | Status: DC | PRN

## 2018-04-18 MED ORDER — GABAPENTIN 300 MG PO CAPS: 300.0000 mg | ORAL_CAPSULE | Freq: Three times a day (TID) | ORAL | 0 refills | Status: DC

## 2018-04-18 NOTE — Progress Notes (Signed)
Physical Therapy Treatment Patient Details Name: Lauren [name] MRN: [id number] DOB: Jan 18, 1951 Today's Date: 04/18/2018    History of Present Illness 67 yo female s/p L TKA 04/16/18. Hx of MDD, fibromyalgia, Hep C, vertigo, T9 comp fx.     PT Comments    Progressing with mobility. Reviewed exercises, gait training, and stair training. Issued HEP for pt to perform 2x/day until she begins OP PT. All education completed. Okay to d/c from PT standpoint-made RN aware.    Follow Up Recommendations  Follow surgeon's recommendation for DC plan and follow-up therapies     Equipment Recommendations  None recommended by PT    Recommendations for Other Services       Precautions / Restrictions Precautions Precautions: Fall Required Braces or Orthoses: Knee Immobilizer - Left Knee Immobilizer - Left: Discontinue once straight leg raise with < 10 degree lag Restrictions Weight Bearing Restrictions: No Other Position/Activity Restrictions: WBAT    Mobility  Bed Mobility               General bed mobility comments: oob standing in room with NT  Transfers Overall transfer level: Needs assistance Equipment used: Rolling walker (2 wheeled) Transfers: Sit to/from Stand Sit to Stand: Min assist         General transfer comment: Assist to rise, stabilize, position L LE, control descent. VCS safety, technique, hand/LE placement.   Ambulation/Gait Ambulation/Gait assistance: Min assist Gait Distance (Feet): 70 Feet Assistive device: Rolling walker (2 wheeled) Gait Pattern/deviations: Step-to pattern;Trunk flexed     General Gait Details: VCs safety, sequence, proper use of RW, posture, distance from RW. Assist to stabilize pt intermittently.    Stairs Stairs: Yes Stairs assistance: Min assist Stair Management: Step to pattern;Backwards;Forwards Number of Stairs: 1(x2) General stair comments: VCs safety, technique, sequence. Min assist to steady pt and mange walker.     Wheelchair Mobility    Modified Rankin (Stroke Patients Only)       Balance                                            Cognition Arousal/Alertness: Awake/alert Behavior During Therapy: WFL for tasks assessed/performed Overall Cognitive Status: Within Functional Limits for tasks assessed                                        Exercises Total Joint Exercises Ankle Circles/Pumps: AROM;Both;10 reps;Seated Quad Sets: AROM;Both;10 reps;Seated Heel Slides: AAROM;Left;10 reps;Seated Hip ABduction/ADduction: AAROM;Left;10 reps;Seated Straight Leg Raises: AAROM;Left;AROM;10 reps;Seated Goniometric ROM: ~10-65 degrees    General Comments        Pertinent Vitals/Pain Pain Assessment: 0-10 Pain Score: 7  Pain Location: L knee Pain Descriptors / Indicators: Aching;Sore;Discomfort;Sharp Pain Intervention(s): Monitored during session;Repositioned;Ice applied    Home Living                      Prior Function            PT Goals (current goals can now be found in the care plan section) Progress towards PT goals: Progressing toward goals    Frequency    7X/week      PT Plan Current plan remains appropriate    Co-evaluation              AM-PAC  PT "6 Clicks" Daily Activity  Outcome Measure  Difficulty turning over in bed (including adjusting bedclothes, sheets and blankets)?: A Little Difficulty moving from lying on back to sitting on the side of the bed? : Unable Difficulty sitting down on and standing up from a chair with arms (e.g., wheelchair, bedside commode, etc,.)?: Unable Help needed moving to and from a bed to chair (including a wheelchair)?: A Little Help needed walking in hospital room?: A Little Help needed climbing 3-5 steps with a railing? : A Little 6 Click Score: 14    End of Session Equipment Utilized During Treatment: Gait belt Activity Tolerance: Patient limited by pain Patient left: in  chair;with call bell/phone within reach   PT Visit Diagnosis: Pain;Other abnormalities of gait and mobility (R26.89);Difficulty in walking, not elsewhere classified (R26.2) Pain - Right/Left: Left Pain - part of body: Knee     Time: 6468-0321 PT Time Calculation (min) (ACUTE ONLY): 21 min  Charges:  $Gait Training: 8-22 mins                        Weston Anna, MPT Pager: [phone]

## 2018-04-18 NOTE — Progress Notes (Signed)
   Subjective: 2 Days Post-Op Procedure(s) (LRB): LEFT TOTAL KNEE ARTHROPLASTY (Left) Patient reports pain as mild.   Patient seen in rounds by Dr. Wynelle Link. Patient is well, and has had no acute complaints or problems other than soreness in the left knee. Voiding without difficulty and positive flatus. Denies chest pain, SOB, or calf pain.  Plan is to go Home after hospital stay.  Objective: Vital signs in last 24 hours: Temp:  [97.6 F (36.4 C)-98.3 F (36.8 C)] 97.6 F (36.4 C) (08/14 0615) Pulse Rate:  [78-92] 78 (08/14 0615) Resp:  [14-17] 16 (08/14 0615) BP: (111-145)/(60-80) 111/60 (08/14 0615) SpO2:  [91 %-96 %] 92 % (08/14 0615)  Intake/Output from previous day:  Intake/Output Summary (Last 24 hours) at 04/18/2018 0805 Last data filed at 04/17/2018 2220 Gross per 24 hour  Intake 1325.68 ml  Output 1975 ml  Net -649.32 ml     Labs: Recent Labs    04/17/18 0514 04/18/18 0510  HGB 11.0* 10.4*   Recent Labs    04/17/18 0514 04/18/18 0510  WBC 7.5 8.2  RBC 3.80* 3.63*  HCT 33.9* 32.1*  PLT 180 178   Recent Labs    04/17/18 0514 04/18/18 0510  NA 139 138  K 3.7 3.7  CL 100 101  CO2 28 30  BUN 11 11  CREATININE 0.68 0.56  GLUCOSE 162* 167*  CALCIUM 9.0 8.9   Exam: General - Patient is Alert and Oriented Extremity - Neurologically intact Neurovascular intact Sensation intact distally Dorsiflexion/Plantar flexion intact Dressing/Incision - clean, dry, no drainage Motor Function - intact, moving foot and toes well on exam.   Past Medical History:  Diagnosis Date  . Anxiety   . Chronic pain   . COPD (chronic obstructive pulmonary disease) ([place])   . Depression   . Fibromyalgia   . Fibromyalgia   . GERD (gastroesophageal reflux disease)   . Hepatitis C   . History of blood transfusion   . History of bronchitis   . IBS (irritable bowel syndrome)   . Osteoarthritis   . Pneumonia   . Prediabetes   . Shortness of breath dyspnea    allergies;  increased pain;   . Vertigo   . Wears glasses     Assessment/Plan: 2 Days Post-Op Procedure(s) (LRB): LEFT TOTAL KNEE ARTHROPLASTY (Left) Principal Problem:   OA (osteoarthritis) of knee  Estimated body mass index is 24.69 kg/m as calculated from the following:   Height as of this encounter: 5\' 6"  (1.676 m).   Weight as of this encounter: 69.4 kg. Up with therapy D/C IV fluids  DVT Prophylaxis - Aspirin Weight-bearing as tolerated  Plan for discharge to home today. Scheduled for outpatient therapy at [facility] in [place]. Follow-up in the office in 2 weeks with Dr. Wynelle Link.   Theresa Duty, PA-C Orthopedic Surgery 04/18/2018, 8:05 AM

## 2018-04-18 NOTE — Progress Notes (Signed)
Reviewed discharge education, dressing changes and prescriptions. Patient verbalizes understanding and states no further questions at this time.

## 2018-04-19 NOTE — Discharge Summary (Signed)
Physician Discharge Summary   Patient ID: Lauren [name] MRN: [id number] DOB/AGE: 68-10-1950 68 y.o.  Admit date: 04/16/2018 Discharge date: 04/18/2018  Primary Diagnosis: Osteoarthritis, left knee   Admission Diagnoses:  Past Medical History:  Diagnosis Date  . Anxiety   . Chronic pain   . COPD (chronic obstructive pulmonary disease) ([place])   . Depression   . Fibromyalgia   . Fibromyalgia   . GERD (gastroesophageal reflux disease)   . Hepatitis C   . History of blood transfusion   . History of bronchitis   . IBS (irritable bowel syndrome)   . Osteoarthritis   . Pneumonia   . Prediabetes   . Shortness of breath dyspnea    allergies; increased pain;   . Vertigo   . Wears glasses    Discharge Diagnoses:   Principal Problem:   OA (osteoarthritis) of knee  Estimated body mass index is 24.69 kg/m as calculated from the following:   Height as of this encounter: 5' 6"  (1.676 m).   Weight as of this encounter: 69.4 kg.  Procedure:  Procedure(s) (LRB): LEFT TOTAL KNEE ARTHROPLASTY (Left)   Consults: None  HPI: Lauren [name] is a 67 y.o. year old female with end stage OA of her left knee with progressively worsening pain and dysfunction. She has constant pain, with activity and at rest and significant functional deficits with difficulties even with ADLs. She has had extensive non-op management including analgesics, injections of cortisone and viscosupplements, and home exercise program, but remains in significant pain with significant dysfunction. Radiographs show bone on bone arthritis medial and patellofemoral. She presents now for left Total Knee Arthroplasty.    Laboratory Data: Admission on 04/16/2018, Discharged on 04/18/2018  Component Date Value Ref Range Status  . WBC 04/17/2018 7.5  4.0 - 10.5 K/uL Final  . RBC 04/17/2018 3.80* 3.87 - 5.11 MIL/uL Final  . Hemoglobin 04/17/2018 11.0* 12.0 - 15.0 g/dL Final  . HCT 04/17/2018 33.9* 36.0 - 46.0 % Final  . MCV  04/17/2018 89.2  78.0 - 100.0 fL Final  . MCH 04/17/2018 28.9  26.0 - 34.0 pg Final  . MCHC 04/17/2018 32.4  30.0 - 36.0 g/dL Final  . RDW 04/17/2018 12.6  11.5 - 15.5 % Final  . Platelets 04/17/2018 180  150 - 400 K/uL Final   Performed at [facility], [place] [address], [place], [place] [address]  . Sodium 04/17/2018 139  135 - 145 mmol/L Final  . Potassium 04/17/2018 3.7  3.5 - 5.1 mmol/L Final  . Chloride 04/17/2018 100  98 - 111 mmol/L Final  . CO2 04/17/2018 28  22 - 32 mmol/L Final  . Glucose, Bld 04/17/2018 162* 70 - 99 mg/dL Final  . BUN 04/17/2018 11  8 - 23 mg/dL Final  . Creatinine, Ser 04/17/2018 0.68  0.44 - 1.00 mg/dL Final  . Calcium 04/17/2018 9.0  8.9 - 10.3 mg/dL Final  . GFR calc non Af Amer 04/17/2018 >60  >60 mL/min Final  . GFR calc Af Amer 04/17/2018 >60  >60 mL/min Final   Comment: (NOTE) The eGFR has been calculated using the CKD EPI equation. This calculation has not been validated in all clinical situations. eGFR's persistently <60 mL/min signify possible Chronic Kidney Disease.   Georgiann Hahn gap 04/17/2018 11  5 - 15 Final   Performed at [facility], [place] [address], [place], [place] [address]  . WBC 04/18/2018 8.2  4.0 - 10.5 K/uL Final  .  RBC 04/18/2018 3.63* 3.87 - 5.11 MIL/uL Final  . Hemoglobin 04/18/2018 10.4* 12.0 - 15.0 g/dL Final  . HCT 04/18/2018 32.1* 36.0 - 46.0 % Final  . MCV 04/18/2018 88.4  78.0 - 100.0 fL Final  . MCH 04/18/2018 28.7  26.0 - 34.0 pg Final  . MCHC 04/18/2018 32.4  30.0 - 36.0 g/dL Final  . RDW 04/18/2018 12.8  11.5 - 15.5 % Final  . Platelets 04/18/2018 178  150 - 400 K/uL Final   Performed at [facility], [place] [address], [place], [place] [address]  . Sodium 04/18/2018 138  135 - 145 mmol/L Final  . Potassium 04/18/2018 3.7  3.5 - 5.1 mmol/L Final  . Chloride 04/18/2018 101  98 - 111 mmol/L Final  . CO2 04/18/2018 30  22 - 32 mmol/L Final  . Glucose, Bld 04/18/2018  167* 70 - 99 mg/dL Final  . BUN 04/18/2018 11  8 - 23 mg/dL Final  . Creatinine, Ser 04/18/2018 0.56  0.44 - 1.00 mg/dL Final  . Calcium 04/18/2018 8.9  8.9 - 10.3 mg/dL Final  . GFR calc non Af Amer 04/18/2018 >60  >60 mL/min Final  . GFR calc Af Amer 04/18/2018 >60  >60 mL/min Final   Comment: (NOTE) The eGFR has been calculated using the CKD EPI equation. This calculation has not been validated in all clinical situations. eGFR's persistently <60 mL/min signify possible Chronic Kidney Disease.   Georgiann Hahn gap 04/18/2018 7  5 - 15 Final   Performed at [facility], [place] [address] [place], [place] [address]  Hospital Outpatient Visit on 04/09/2018  Component Date Value Ref Range Status  . aPTT 04/09/2018 35  24 - 36 seconds Final   Performed at [facility], [place] [address] [place], [place] [address]  . WBC 04/09/2018 6.1  4.0 - 10.5 K/uL Final  . RBC 04/09/2018 4.66  3.87 - 5.11 MIL/uL Final  . Hemoglobin 04/09/2018 13.3  12.0 - 15.0 g/dL Final  . HCT 04/09/2018 42.0  36.0 - 46.0 % Final  . MCV 04/09/2018 90.1  78.0 - 100.0 fL Final  . MCH 04/09/2018 28.5  26.0 - 34.0 pg Final  . MCHC 04/09/2018 31.7  30.0 - 36.0 g/dL Final  . RDW 04/09/2018 13.1  11.5 - 15.5 % Final  . Platelets 04/09/2018 228  150 - 400 K/uL Final   Performed at [facility], [place] [address], [place], [place] [address]  . Sodium 04/09/2018 139  135 - 145 mmol/L Final  . Potassium 04/09/2018 4.1  3.5 - 5.1 mmol/L Final  . Chloride 04/09/2018 100  98 - 111 mmol/L Final  . CO2 04/09/2018 32  22 - 32 mmol/L Final  . Glucose, Bld 04/09/2018 91  70 - 99 mg/dL Final  . BUN 04/09/2018 13  8 - 23 mg/dL Final  . Creatinine, Ser 04/09/2018 0.71  0.44 - 1.00 mg/dL Final  . Calcium 04/09/2018 9.6  8.9 - 10.3 mg/dL Final  . Total Protein 04/09/2018 7.8  6.5 - 8.1 g/dL Final  . Albumin 04/09/2018 4.3  3.5 - 5.0 g/dL Final  . AST 04/09/2018 26  15 - 41 U/L Final  . ALT  04/09/2018 27  0 - 44 U/L Final  . Alkaline Phosphatase 04/09/2018 61  38 - 126 U/L Final  . Total Bilirubin 04/09/2018 0.5  0.3 - 1.2 mg/dL Final  . GFR calc non Af Amer 04/09/2018 >60  >60 mL/min Final  .  GFR calc Af Amer 04/09/2018 >60  >60 mL/min Final   Comment: (NOTE) The eGFR has been calculated using the CKD EPI equation. This calculation has not been validated in all clinical situations. eGFR's persistently <60 mL/min signify possible Chronic Kidney Disease.   Georgiann Hahn gap 04/09/2018 7  5 - 15 Final   Performed at [facility], [place] [address] [place], [place] [address]  . Prothrombin Time 04/09/2018 12.2  11.4 - 15.2 seconds Final  . INR 04/09/2018 0.92   Final   Performed at [place] [address], [place], [place] [address]  . ABO/RH(D) 04/09/2018 A NEG   Final  . Antibody Screen 04/09/2018 NEG   Final  . Sample Expiration 04/09/2018 04/19/2018   Final  . Extend sample reason 04/09/2018    Final                   Value:NO TRANSFUSIONS OR PREGNANCY IN THE PAST 3 MONTHS Performed at [facility], [place] [address], [place], [place] [address]   . MRSA, PCR 04/09/2018 NEGATIVE  NEGATIVE Final  . Staphylococcus aureus 04/09/2018 NEGATIVE  NEGATIVE Final   Comment: (NOTE) The Xpert SA Assay (FDA approved for NASAL specimens in patients 43 years of age and older), is one component of a comprehensive surveillance program. It is not intended to diagnose infection nor to guide or monitor treatment. Performed at [facility], [place] [address], [place], [place] [address]   . Color, Urine 04/09/2018 YELLOW  YELLOW Final  . APPearance 04/09/2018 CLEAR  CLEAR Final  . Specific Gravity, Urine 04/09/2018 1.006  1.005 - 1.030 Final  . pH 04/09/2018 7.0  5.0 - 8.0 Final  . Glucose, UA 04/09/2018 NEGATIVE  NEGATIVE mg/dL Final  . Hgb urine dipstick 04/09/2018 NEGATIVE  NEGATIVE Final  . Bilirubin Urine  04/09/2018 NEGATIVE  NEGATIVE Final  . Ketones, ur 04/09/2018 NEGATIVE  NEGATIVE mg/dL Final  . Protein, ur 04/09/2018 NEGATIVE  NEGATIVE mg/dL Final  . Nitrite 04/09/2018 NEGATIVE  NEGATIVE Final  . Leukocytes, UA 04/09/2018 NEGATIVE  NEGATIVE Final   Performed at [facility] [address], [place],  [address]  Appointment on 03/16/2018  Component Date Value Ref Range Status  . Weight 03/16/2018 2,464  oz Final  . Height 03/16/2018 66.000  in Final  Appointment on 03/16/2018  Component Date Value Ref Range Status  . Rest HR 03/16/2018 80  bpm Final  . Rest BP 03/16/2018 142/80  mmHg Final  . Peak HR 03/16/2018 99  bpm Final  . Peak BP 03/16/2018 118/61  mmHg Final  . SSS 03/16/2018 8   Final  . SRS 03/16/2018 4   Final  . SDS 03/16/2018 4   Final  . LHR 03/16/2018 0.30   Final  . TID 03/16/2018 0.98   Final  . LV sys vol 03/16/2018 18  mL Final  . LV dias vol 03/16/2018 61  46 - 106 mL Final     X-Rays:No results found.  EKG: Orders placed or performed in visit on 02/22/18  . EKG 12-Lead     Hospital Course: Lauren [name] is a 67 y.o. who was admitted to [facility]. They were brought to the operating room on 04/16/2018 and underwent Procedure(s): LEFT TOTAL KNEE ARTHROPLASTY.  Patient tolerated the procedure well and was later transferred to the recovery room and then to the orthopaedic floor for postoperative care.  They were given PO  and IV analgesics for pain control following their surgery.  They were given 24 hours of postoperative antibiotics of  Anti-infectives (From admission, onward)   Start     Dose/Rate Route Frequency Ordered Stop   04/16/18 1300  ceFAZolin (ANCEF) IVPB 2g/100 mL premix     2 g 200 mL/hr over 30 Minutes Intravenous Every 6 hours 04/16/18 1006 04/16/18 1925   04/16/18 0615  ceFAZolin (ANCEF) IVPB 2g/100 mL premix     2 g 200 mL/hr over 30 Minutes Intravenous On call to O.R. 04/16/18 4503 04/16/18 0732      and started on DVT prophylaxis in the form of Aspirin.   PT and OT were ordered for total joint protocol.  Discharge planning consulted to help with postop disposition and equipment needs.  Patient had a good night on the evening of surgery. She started to get up OOB with therapy on POD #0. Hemovac drain was pulled without difficulty on POD #1. She continued to work with therapy into POD #2. Pt was seen during rounds and was ready to go home pending progress with therapy. Dressing was changed and the incision was clean, dry, and intact with no drainage. Pt worked with therapy for one additional session and was meeting her goals. She was discharged to home later that day in stable condition.   Diet: Diabetic diet Activity:WBAT Follow-up:in 2 weeks with Dr. Wynelle Link Disposition - Home with outpatient PT at [facility] in [place] Discharged Condition: stable   Discharge Instructions    Call MD / Call 911   Complete by:  As directed    If you experience chest pain or shortness of breath, CALL 911 and be transported to the hospital emergency room.  If you develope a fever above 101 F, pus (white drainage) or increased drainage or redness at the wound, or calf pain, call your surgeon's office.   Change dressing   Complete by:  As directed    Change the dressing daily with sterile 4 x 4 inch gauze dressing and apply TED hose.   Constipation Prevention   Complete by:  As directed    Drink plenty of fluids.  Prune juice may be helpful.  You may use a stool softener, such as Colace (over the counter) 100 mg twice a day.  Use MiraLax (over the counter) for constipation as needed.   Diet - low sodium heart healthy   Complete by:  As directed    Discharge instructions   Complete by:  As directed    Dr. Gaynelle Arabian Total Joint Specialist [facility] [address] [place], [place] [address] [phone]  TOTAL KNEE REPLACEMENT POSTOPERATIVE DIRECTIONS  Knee Rehabilitation, Guidelines  Following Surgery  Results after knee surgery are often greatly improved when you follow the exercise, range of motion and muscle strengthening exercises prescribed by your doctor. Safety measures are also important to protect the knee from further injury. Any time any of these exercises cause you to have increased pain or swelling in your knee joint, decrease the amount until you are comfortable again and slowly increase them. If you have problems or questions, call your caregiver or physical therapist for advice.   HOME CARE INSTRUCTIONS  Remove items at home which could result in a fall. This includes throw rugs or furniture in walking pathways.  ICE to the affected knee every three hours for 30 minutes at a time and then as needed for pain and swelling.  Continue to use ice on the  knee for pain and swelling from surgery. You may notice swelling that will progress down to the foot and ankle.  This is normal after surgery.  Elevate the leg when you are not up walking on it.   Continue to use the breathing machine which will help keep your temperature down.  It is common for your temperature to cycle up and down following surgery, especially at night when you are not up moving around and exerting yourself.  The breathing machine keeps your lungs expanded and your temperature down. Do not place pillow under knee, focus on keeping the knee straight while resting   DIET You may resume your previous home diet once your are discharged from the hospital.  DRESSING / WOUND CARE / SHOWERING You may shower 3 days after surgery, but keep the wounds dry during showering.  You may use an occlusive plastic wrap (Press'n Seal for example), NO SOAKING/SUBMERGING IN THE BATHTUB.  If the bandage gets wet, change with a clean dry gauze.  If the incision gets wet, pat the wound dry with a clean towel. You may start showering once you are discharged home but do not submerge the incision under water. Just pat the incision  dry and apply a dry gauze dressing on daily. Change the surgical dressing daily and reapply a dry dressing each time.  ACTIVITY Walk with your walker as instructed. Use walker as long as suggested by your caregivers. Avoid periods of inactivity such as sitting longer than an hour when not asleep. This helps prevent blood clots.  You may resume a sexual relationship in one month or when given the OK by your doctor.  You may return to work once you are cleared by your doctor.  Do not drive a car for 6 weeks or until released by you surgeon.  Do not drive while taking narcotics.  WEIGHT BEARING Weight bearing as tolerated with assist device (walker, cane, etc) as directed, use it as long as suggested by your surgeon or therapist, typically at least 4-6 weeks.  POSTOPERATIVE CONSTIPATION PROTOCOL Constipation - defined medically as fewer than three stools per week and severe constipation as less than one stool per week.  One of the most common issues patients have following surgery is constipation.  Even if you have a regular bowel pattern at home, your normal regimen is likely to be disrupted due to multiple reasons following surgery.  Combination of anesthesia, postoperative narcotics, change in appetite and fluid intake all can affect your bowels.  In order to avoid complications following surgery, here are some recommendations in order to help you during your recovery period.  Colace (docusate) - Pick up an over-the-counter form of Colace or another stool softener and take twice a day as long as you are requiring postoperative pain medications.  Take with a full glass of water daily.  If you experience loose stools or diarrhea, hold the colace until you stool forms back up.  If your symptoms do not get better within 1 week or if they get worse, check with your doctor.  Dulcolax (bisacodyl) - Pick up over-the-counter and take as directed by the product packaging as needed to assist with the  movement of your bowels.  Take with a full glass of water.  Use this product as needed if not relieved by Colace only.   MiraLax (polyethylene glycol) - Pick up over-the-counter to have on hand.  MiraLax is a solution that will increase the amount of water in your bowels  to assist with bowel movements.  Take as directed and can mix with a glass of water, juice, soda, coffee, or tea.  Take if you go more than two days without a movement. Do not use MiraLax more than once per day. Call your doctor if you are still constipated or irregular after using this medication for 7 days in a row.  If you continue to have problems with postoperative constipation, please contact the office for further assistance and recommendations.  If you experience "the worst abdominal pain ever" or develop nausea or vomiting, please contact the office immediatly for further recommendations for treatment.  ITCHING  If you experience itching with your medications, try taking only a single pain pill, or even half a pain pill at a time.  You can also use Benadryl over the counter for itching or also to help with sleep.   TED HOSE STOCKINGS Wear the elastic stockings on both legs for three weeks following surgery during the day but you may remove then at night for sleeping.  MEDICATIONS See your medication summary on the "After Visit Summary" that the nursing staff will review with you prior to discharge.  You may have some home medications which will be placed on hold until you complete the course of blood thinner medication.  It is important for you to complete the blood thinner medication as prescribed by your surgeon.  Continue your approved medications as instructed at time of discharge.  PRECAUTIONS If you experience chest pain or shortness of breath - call 911 immediately for transfer to the hospital emergency department.  If you develop a fever greater that 101 F, purulent drainage from wound, increased redness or  drainage from wound, foul odor from the wound/dressing, or calf pain - CONTACT YOUR SURGEON.                                                   FOLLOW-UP APPOINTMENTS Make sure you keep all of your appointments after your operation with your surgeon and caregivers. You should call the office at the above phone number and make an appointment for approximately two weeks after the date of your surgery or on the date instructed by your surgeon outlined in the "After Visit Summary".   RANGE OF MOTION AND STRENGTHENING EXERCISES  Rehabilitation of the knee is important following a knee injury or an operation. After just a few days of immobilization, the muscles of the thigh which control the knee become weakened and shrink (atrophy). Knee exercises are designed to build up the tone and strength of the thigh muscles and to improve knee motion. Often times heat used for twenty to thirty minutes before working out will loosen up your tissues and help with improving the range of motion but do not use heat for the first two weeks following surgery. These exercises can be done on a training (exercise) mat, on the floor, on a table or on a bed. Use what ever works the best and is most comfortable for you Knee exercises include:  Leg Lifts - While your knee is still immobilized in a splint or cast, you can do straight leg raises. Lift the leg to 60 degrees, hold for 3 sec, and slowly lower the leg. Repeat 10-20 times 2-3 times daily. Perform this exercise against resistance later as your knee gets better.  Quad and Hamstring Sets - Tighten up the muscle on the front of the thigh (Quad) and hold for 5-10 sec. Repeat this 10-20 times hourly. Hamstring sets are done by pushing the foot backward against an object and holding for 5-10 sec. Repeat as with quad sets.  Leg Slides: Lying on your back, slowly slide your foot toward your buttocks, bending your knee up off the floor (only go as far as is comfortable). Then slowly  slide your foot back down until your leg is flat on the floor again. Angel Wings: Lying on your back spread your legs to the side as far apart as you can without causing discomfort.  A rehabilitation program following serious knee injuries can speed recovery and prevent re-injury in the future due to weakened muscles. Contact your doctor or a physical therapist for more information on knee rehabilitation.   IF YOU ARE TRANSFERRED TO A SKILLED REHAB FACILITY If the patient is transferred to a skilled rehab facility following release from the hospital, a list of the current medications will be sent to the facility for the patient to continue.  When discharged from the skilled rehab facility, please have the facility set up the patient's [place] prior to being released. Also, the skilled facility will be responsible for providing the patient with their medications at time of release from the facility to include their pain medication, the muscle relaxants, and their blood thinner medication. If the patient is still at the rehab facility at time of the two week follow up appointment, the skilled rehab facility will also need to assist the patient in arranging follow up appointment in our office and any transportation needs.  MAKE SURE YOU:  Understand these instructions.  Get help right away if you are not doing well or get worse.    Pick up stool softner and laxative for home use following surgery while on pain medications. Do not submerge incision under water. Please use good hand washing techniques while changing dressing each day. May shower starting three days after surgery. Please use a clean towel to pat the incision dry following showers. Continue to use ice for pain and swelling after surgery. Do not use any lotions or creams on the incision until instructed by your surgeon.   Do not put a pillow under the knee. Place it under the heel.   Complete by:  As directed     Driving restrictions   Complete by:  As directed    No driving for two weeks   TED hose   Complete by:  As directed    Use stockings (TED hose) for three weeks on both leg(s).  You may remove them at night for sleeping.   Weight bearing as tolerated   Complete by:  As directed      Allergies as of 04/18/2018      Reactions   Elavil [amitriptyline] Other (See Comments)   Vivid dreams and almost suicidal   Flagyl [metronidazole] Itching   Vistaril [hydroxyzine Hcl] Other (See Comments)   Got higher than a kite on too much of this.   Geodon [ziprasidone Hydrochloride] Other (See Comments)   Blacked out   Lactose Intolerance (gi) Other (See Comments)   GI upset   Latex Other (See Comments)   Red at site      Medication List    STOP taking these medications   HYDROcodone-acetaminophen 10-325 MG tablet Commonly known as:  NORCO   naproxen sodium 220 MG  tablet Commonly known as:  ALEVE     TAKE these medications   albuterol 108 (90 Base) MCG/ACT inhaler Commonly known as:  PROVENTIL HFA;VENTOLIN HFA Inhale 2 puffs into the lungs every 4 (four) hours as needed for wheezing or shortness of breath.   aspirin 325 MG EC tablet Take 1 tablet (325 mg total) by mouth 2 (two) times daily for 19 days. Take one tablet (325 mg) Aspirin two times a day for three weeks following surgery. Then resume one baby Aspirin (81 mg) once a day What changed:    medication strength  how much to take  when to take this  additional instructions   B-12 2000 MCG Tabs Take 2,000 mcg by mouth daily.   calcium-vitamin D 500-200 MG-UNIT tablet Commonly known as:  OSCAL WITH D Take 1 tablet by mouth 2 (two) times daily. For low calcium What changed:  when to take this   CoQ10 100 MG Caps Take 1 capsule daily by mouth.   diclofenac sodium 1 % Gel Commonly known as:  VOLTAREN Apply 2 g topically 4 (four) times daily as needed (For pain.).   dicyclomine 20 MG tablet Commonly known as:   BENTYL Take 1 tablet (20 mg total) by mouth every 6 (six) hours as needed for spasms.   diphenoxylate-atropine 2.5-0.025 MG tablet Commonly known as:  LOMOTIL Take 1 tablet by mouth 3 (three) times daily as needed. What changed:  reasons to take this   gabapentin 300 MG capsule Commonly known as:  NEURONTIN Take 1 capsule (300 mg total) by mouth 3 (three) times daily. Gabapentin 300 mg Protocol Take a 300 mg capsule three times a day for two weeks following surgery. Then take a 300 mg capsule two times a day for two weeks.  Then take a 300 mg capsule once a day for two weeks.  Then resume Gabapentin as you were previously taking. What changed:    medication strength  how much to take  when to take this  reasons to take this  additional instructions   HAIR/SKIN/NAILS PO Take 2 tablets by mouth daily.   Krill Oil 350 MG Caps Take 350 mg by mouth daily.   lamoTRIgine 100 MG tablet Commonly known as:  LAMICTAL Take one half in the am and one in the evening What changed:    how much to take  how to take this  when to take this  additional instructions   LORazepam 1 MG tablet Commonly known as:  ATIVAN Take 1.5 tablets (1.5 mg total) by mouth at bedtime.   multivitamin with minerals tablet Take 1 tablet daily by mouth.   OSTEO BI-FLEX JOINT SHIELD PO Take 1 tablet by mouth daily.   oxyCODONE 5 MG immediate release tablet Commonly known as:  Oxy IR/ROXICODONE Take 1-2 tablets (5-10 mg total) by mouth every 6 (six) hours as needed for moderate pain (pain score 4-6).   pravastatin 80 MG tablet Commonly known as:  PRAVACHOL Take 80 mg at bedtime by mouth.   ranitidine 150 MG tablet Commonly known as:  ZANTAC TAKE 1 TABLET (150 MG TOTAL) BY MOUTH 2 (TWO) TIMES DAILY.   SYMBICORT 160-4.5 MCG/ACT inhaler Generic drug:  budesonide-formoterol Inhale 2 puffs into the lungs 2 (two) times daily.   tiZANidine 4 MG tablet Commonly known as:  ZANAFLEX Take 1  tablet (4 mg total) by mouth every 6 (six) hours as needed for muscle spasms. What changed:    when to take this  reasons  to take this   Turmeric 450 MG Caps Take 450 mg by mouth daily.   venlafaxine XR 75 MG 24 hr capsule Commonly known as:  EFFEXOR-XR Take 1 capsule (75 mg total) by mouth 2 (two) times daily. What changed:  additional instructions   vitamin E 400 UNIT capsule Take 400 Units daily by mouth.            Discharge Care Instructions  (From admission, onward)         Start     Ordered   04/18/18 0000  Weight bearing as tolerated     04/18/18 0809   04/18/18 0000  Change dressing    Comments:  Change the dressing daily with sterile 4 x 4 inch gauze dressing and apply TED hose.   04/18/18 0809         Follow-up Information    Gaynelle Arabian, MD. Schedule an appointment as soon as possible for a visit on 05/01/2018.   Specialty:  Orthopedic Surgery Contact information: [address] [place] [address] [phone]           Signed: Theresa Duty, PA-C Orthopaedic Surgery 04/19/2018, 1:47 PM

## 2018-04-20 DIAGNOSIS — M25462 Effusion, left knee: Secondary | ICD-10-CM | POA: Diagnosis not present

## 2018-04-20 DIAGNOSIS — M25562 Pain in left knee: Secondary | ICD-10-CM | POA: Diagnosis not present

## 2018-04-20 DIAGNOSIS — M25662 Stiffness of left knee, not elsewhere classified: Secondary | ICD-10-CM | POA: Diagnosis not present

## 2018-04-20 DIAGNOSIS — M62552 Muscle wasting and atrophy, not elsewhere classified, left thigh: Secondary | ICD-10-CM | POA: Diagnosis not present

## 2018-04-24 DIAGNOSIS — M25662 Stiffness of left knee, not elsewhere classified: Secondary | ICD-10-CM | POA: Diagnosis not present

## 2018-04-24 DIAGNOSIS — M25562 Pain in left knee: Secondary | ICD-10-CM | POA: Diagnosis not present

## 2018-04-24 DIAGNOSIS — M62552 Muscle wasting and atrophy, not elsewhere classified, left thigh: Secondary | ICD-10-CM | POA: Diagnosis not present

## 2018-04-24 DIAGNOSIS — M25462 Effusion, left knee: Secondary | ICD-10-CM | POA: Diagnosis not present

## 2018-04-27 DIAGNOSIS — M62552 Muscle wasting and atrophy, not elsewhere classified, left thigh: Secondary | ICD-10-CM | POA: Diagnosis not present

## 2018-04-27 DIAGNOSIS — M25662 Stiffness of left knee, not elsewhere classified: Secondary | ICD-10-CM | POA: Diagnosis not present

## 2018-04-27 DIAGNOSIS — M25462 Effusion, left knee: Secondary | ICD-10-CM | POA: Diagnosis not present

## 2018-04-27 DIAGNOSIS — M25562 Pain in left knee: Secondary | ICD-10-CM | POA: Diagnosis not present

## 2018-05-01 DIAGNOSIS — M25662 Stiffness of left knee, not elsewhere classified: Secondary | ICD-10-CM | POA: Diagnosis not present

## 2018-05-01 DIAGNOSIS — M62552 Muscle wasting and atrophy, not elsewhere classified, left thigh: Secondary | ICD-10-CM | POA: Diagnosis not present

## 2018-05-01 DIAGNOSIS — M25462 Effusion, left knee: Secondary | ICD-10-CM | POA: Diagnosis not present

## 2018-05-01 DIAGNOSIS — M25562 Pain in left knee: Secondary | ICD-10-CM | POA: Diagnosis not present

## 2018-05-04 DIAGNOSIS — M25662 Stiffness of left knee, not elsewhere classified: Secondary | ICD-10-CM | POA: Diagnosis not present

## 2018-05-04 DIAGNOSIS — M62552 Muscle wasting and atrophy, not elsewhere classified, left thigh: Secondary | ICD-10-CM | POA: Diagnosis not present

## 2018-05-04 DIAGNOSIS — M25462 Effusion, left knee: Secondary | ICD-10-CM | POA: Diagnosis not present

## 2018-05-04 DIAGNOSIS — M25562 Pain in left knee: Secondary | ICD-10-CM | POA: Diagnosis not present

## 2018-05-09 DIAGNOSIS — M25562 Pain in left knee: Secondary | ICD-10-CM | POA: Diagnosis not present

## 2018-05-09 DIAGNOSIS — M62552 Muscle wasting and atrophy, not elsewhere classified, left thigh: Secondary | ICD-10-CM | POA: Diagnosis not present

## 2018-05-09 DIAGNOSIS — M25662 Stiffness of left knee, not elsewhere classified: Secondary | ICD-10-CM | POA: Diagnosis not present

## 2018-05-09 DIAGNOSIS — M25462 Effusion, left knee: Secondary | ICD-10-CM | POA: Diagnosis not present

## 2018-05-14 DIAGNOSIS — M25562 Pain in left knee: Secondary | ICD-10-CM | POA: Diagnosis not present

## 2018-05-14 DIAGNOSIS — M62552 Muscle wasting and atrophy, not elsewhere classified, left thigh: Secondary | ICD-10-CM | POA: Diagnosis not present

## 2018-05-14 DIAGNOSIS — M25662 Stiffness of left knee, not elsewhere classified: Secondary | ICD-10-CM | POA: Diagnosis not present

## 2018-05-14 DIAGNOSIS — M25462 Effusion, left knee: Secondary | ICD-10-CM | POA: Diagnosis not present

## 2018-05-17 DIAGNOSIS — M25662 Stiffness of left knee, not elsewhere classified: Secondary | ICD-10-CM | POA: Diagnosis not present

## 2018-05-17 DIAGNOSIS — M25562 Pain in left knee: Secondary | ICD-10-CM | POA: Diagnosis not present

## 2018-05-17 DIAGNOSIS — M62552 Muscle wasting and atrophy, not elsewhere classified, left thigh: Secondary | ICD-10-CM | POA: Diagnosis not present

## 2018-05-17 DIAGNOSIS — M25462 Effusion, left knee: Secondary | ICD-10-CM | POA: Diagnosis not present

## 2018-05-24 DIAGNOSIS — M62552 Muscle wasting and atrophy, not elsewhere classified, left thigh: Secondary | ICD-10-CM | POA: Diagnosis not present

## 2018-05-24 DIAGNOSIS — M25562 Pain in left knee: Secondary | ICD-10-CM | POA: Diagnosis not present

## 2018-05-24 DIAGNOSIS — M25462 Effusion, left knee: Secondary | ICD-10-CM | POA: Diagnosis not present

## 2018-05-24 DIAGNOSIS — M25662 Stiffness of left knee, not elsewhere classified: Secondary | ICD-10-CM | POA: Diagnosis not present

## 2018-05-25 DIAGNOSIS — M25662 Stiffness of left knee, not elsewhere classified: Secondary | ICD-10-CM | POA: Diagnosis not present

## 2018-05-25 DIAGNOSIS — M25562 Pain in left knee: Secondary | ICD-10-CM | POA: Diagnosis not present

## 2018-05-25 DIAGNOSIS — M62552 Muscle wasting and atrophy, not elsewhere classified, left thigh: Secondary | ICD-10-CM | POA: Diagnosis not present

## 2018-05-25 DIAGNOSIS — M25462 Effusion, left knee: Secondary | ICD-10-CM | POA: Diagnosis not present

## 2018-05-28 ENCOUNTER — Encounter (HOSPITAL_COMMUNITY): Payer: Self-pay | Admitting: Psychiatry

## 2018-05-28 ENCOUNTER — Ambulatory Visit (INDEPENDENT_AMBULATORY_CARE_PROVIDER_SITE_OTHER): Payer: Medicare Other | Admitting: Psychiatry

## 2018-05-28 DIAGNOSIS — F411 Generalized anxiety disorder: Secondary | ICD-10-CM | POA: Diagnosis not present

## 2018-05-28 DIAGNOSIS — Z6824 Body mass index (BMI) 24.0-24.9, adult: Secondary | ICD-10-CM | POA: Diagnosis not present

## 2018-05-28 DIAGNOSIS — I10 Essential (primary) hypertension: Secondary | ICD-10-CM | POA: Diagnosis not present

## 2018-05-28 DIAGNOSIS — E119 Type 2 diabetes mellitus without complications: Secondary | ICD-10-CM | POA: Diagnosis not present

## 2018-05-28 DIAGNOSIS — G894 Chronic pain syndrome: Secondary | ICD-10-CM | POA: Diagnosis not present

## 2018-05-28 DIAGNOSIS — F332 Major depressive disorder, recurrent severe without psychotic features: Secondary | ICD-10-CM | POA: Diagnosis not present

## 2018-05-28 DIAGNOSIS — Z1389 Encounter for screening for other disorder: Secondary | ICD-10-CM | POA: Diagnosis not present

## 2018-05-28 DIAGNOSIS — M1991 Primary osteoarthritis, unspecified site: Secondary | ICD-10-CM | POA: Diagnosis not present
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
## 2018-05-29 DIAGNOSIS — M25462 Effusion, left knee: Secondary | ICD-10-CM | POA: Diagnosis not present

## 2018-05-29 DIAGNOSIS — Z96652 Presence of left artificial knee joint: Secondary | ICD-10-CM | POA: Diagnosis not present

## 2018-05-29 DIAGNOSIS — M62552 Muscle wasting and atrophy, not elsewhere classified, left thigh: Secondary | ICD-10-CM | POA: Diagnosis not present

## 2018-05-29 DIAGNOSIS — M25562 Pain in left knee: Secondary | ICD-10-CM | POA: Diagnosis not present

## 2018-05-29 DIAGNOSIS — M25662 Stiffness of left knee, not elsewhere classified: Secondary | ICD-10-CM | POA: Diagnosis not present

## 2018-06-06 DIAGNOSIS — M62552 Muscle wasting and atrophy, not elsewhere classified, left thigh: Secondary | ICD-10-CM | POA: Diagnosis not present

## 2018-06-06 DIAGNOSIS — M25662 Stiffness of left knee, not elsewhere classified: Secondary | ICD-10-CM | POA: Diagnosis not present

## 2018-06-06 DIAGNOSIS — M25562 Pain in left knee: Secondary | ICD-10-CM | POA: Diagnosis not present

## 2018-06-06 DIAGNOSIS — M25462 Effusion, left knee: Secondary | ICD-10-CM | POA: Diagnosis not present

## 2018-06-11 DIAGNOSIS — M62552 Muscle wasting and atrophy, not elsewhere classified, left thigh: Secondary | ICD-10-CM | POA: Diagnosis not present

## 2018-06-11 DIAGNOSIS — M25662 Stiffness of left knee, not elsewhere classified: Secondary | ICD-10-CM | POA: Diagnosis not present

## 2018-06-11 DIAGNOSIS — M25462 Effusion, left knee: Secondary | ICD-10-CM | POA: Diagnosis not present

## 2018-06-11 DIAGNOSIS — M25562 Pain in left knee: Secondary | ICD-10-CM | POA: Diagnosis not present

## 2018-06-12 ENCOUNTER — Encounter (HOSPITAL_COMMUNITY): Payer: Self-pay | Admitting: Psychiatry

## 2018-06-12 ENCOUNTER — Ambulatory Visit (INDEPENDENT_AMBULATORY_CARE_PROVIDER_SITE_OTHER): Payer: Medicare Other | Admitting: Psychiatry

## 2018-06-12 VITALS — BP 125/77 | HR 88 | Ht 66.0 in | Wt 152.0 lb

## 2018-06-12 DIAGNOSIS — F411 Generalized anxiety disorder: Secondary | ICD-10-CM

## 2018-06-12 DIAGNOSIS — Z87891 Personal history of nicotine dependence: Secondary | ICD-10-CM

## 2018-06-12 DIAGNOSIS — R0981 Nasal congestion: Secondary | ICD-10-CM | POA: Diagnosis not present

## 2018-06-12 DIAGNOSIS — J019 Acute sinusitis, unspecified: Secondary | ICD-10-CM | POA: Diagnosis not present

## 2018-06-12 DIAGNOSIS — H9203 Otalgia, bilateral: Secondary | ICD-10-CM | POA: Diagnosis not present

## 2018-06-12 DIAGNOSIS — J029 Acute pharyngitis, unspecified: Secondary | ICD-10-CM | POA: Diagnosis not present

## 2018-06-12 DIAGNOSIS — F332 Major depressive disorder, recurrent severe without psychotic features: Secondary | ICD-10-CM

## 2018-06-12 MED ORDER — VENLAFAXINE HCL ER 75 MG PO CP24: 75.0000 mg | ORAL_CAPSULE | Freq: Two times a day (BID) | ORAL | 2 refills | Status: DC

## 2018-06-12 MED ORDER — LORAZEPAM 1 MG PO TABS
1.5000 mg | ORAL_TABLET | Freq: Every day | ORAL | 2 refills | Status: DC
Start: 2018-06-12 — End: 2018-09-20

## 2018-06-12 MED ORDER — LAMOTRIGINE 100 MG PO TABS: ORAL_TABLET | ORAL | 2 refills | Status: DC

## 2018-06-12 NOTE — Progress Notes (Signed)
[facility] MD/PA/NP OP Progress Note  06/12/2018 2:25 PM Lauren [name]  MRN:  [id number]  Chief Complaint:  Chief Complaint    Depression; Anxiety; Manic Behavior; Follow-up     HPI: This patient is a 67 year old married white female who lives with her husband. She has one stepchild. Her step son killed himself 9 years ago.Marland Kitchen He was an alcoholic. The patient is on disability for fibromyalgia  The patient has a long history of depression. She was last hospitalized in 2010 after she took too many pain pills and got confused. She use to drink heavily but has been sober 23 years and is very active in [employer] and church. For the most part she feels her mood is stable. She sleeping pretty well. Her energy is good. She has a hard time functioning without Ativan but she only usually takes one pill a day side think this is reasonable. She tried a higher dose of Cymbalta but it made her manic and revved up  The patient returns after 2 months.  Since I last saw her she has had total knee replacement on the left.  She is doing very well with her rehab and is already walking quite a bit and [facility]  She states this helps relieve her stress.  She is still dealing with her husband's chronic illness and possibly metastatic bladder cancer.  Her husband tends to overuse his pain medication which causes her great deal of anxiety.  I encouraged her to see if his doctor would call in Narcan to have on hand.  Overall her mood has been good she is sleeping well and her anxiety is under good control.  She denies any manic symptoms and she is not speaking in a pressured way.  She denies suicidal ideation Visit Diagnosis:    ICD-10-CM   1. Major depressive disorder, recurrent, severe without psychotic features ([place]) F33.2   2. Generalized anxiety disorder F41.1     Past Psychiatric History: Long history of depression mood instability.  She was last hospitalized about 4 years ago for suicidal  ideation  Past Medical History:  Past Medical History:  Diagnosis Date  . Anxiety   . Chronic pain   . COPD (chronic obstructive pulmonary disease) ([place])   . Depression   . Fibromyalgia   . Fibromyalgia   . GERD (gastroesophageal reflux disease)   . Hepatitis C   . History of blood transfusion   . History of bronchitis   . IBS (irritable bowel syndrome)   . Osteoarthritis   . Pneumonia   . Prediabetes   . Shortness of breath dyspnea    allergies; increased pain;   . Vertigo   . Wears glasses     Past Surgical History:  Procedure Laterality Date  . CATARACT EXTRACTION W/PHACO Right 08/01/2017   Procedure: CATARACT EXTRACTION PHACO AND INTRAOCULAR LENS PLACEMENT RIGHT EYE;  Surgeon: Rutherford Guys, MD;  Location: [facility];  Service: Ophthalmology;  Laterality: Right;  CDE: 3.30  . CATARACT EXTRACTION W/PHACO Left 08/15/2017   Procedure: CATARACT EXTRACTION PHACO AND INTRAOCULAR LENS PLACEMENT (IOC);  Surgeon: Rutherford Guys, MD;  Location: [facility];  Service: Ophthalmology;  Laterality: Left;  CDE: 4.00  . CHOLECYSTECTOMY    . COLONOSCOPY    . COLONOSCOPY N/A 10/23/2014   Procedure: COLONOSCOPY;  Surgeon: Rogene Houston, MD;  Location: [facility];  Service: Endoscopy;  Laterality: N/A;  1030  . ESOPHAGOGASTRODUODENOSCOPY N/A 10/23/2014   Procedure: ESOPHAGOGASTRODUODENOSCOPY (  EGD);  Surgeon: Rogene Houston, MD;  Location: [facility];  Service: Endoscopy;  Laterality: N/A;  . KNEE ARTHROSCOPY Left 10/21/2015   Procedure: ARTHROSCOPY LEFT KNEE WITH MENICAL DEBRIDEMENT, Chondroplasty;  Surgeon: Gaynelle Arabian, MD;  Location: [facility];  Service: Orthopedics;  Laterality: Left;  Marland Kitchen MANDIBLE SURGERY  09/06/1979  . ORIF WRIST FRACTURE Right 07/23/2013   Procedure: OPEN REDUCTION INTERNAL FIXATION (ORIF) WRIST FRACTURE;  Surgeon: Roseanne Kaufman, MD;  Location: [facility];  Service: Orthopedics;  Laterality: Right;  . TONSILLECTOMY    . TOTAL KNEE ARTHROPLASTY     2011 rt knee  . TOTAL KNEE  ARTHROPLASTY Left 04/16/2018   Procedure: LEFT TOTAL KNEE ARTHROPLASTY;  Surgeon: Gaynelle Arabian, MD;  Location: [facility];  Service: Orthopedics;  Laterality: Left;  . UPPER GASTROINTESTINAL ENDOSCOPY      Family Psychiatric History: See below  Family History:  Family History  Problem Relation Age of Onset  . Alcohol abuse Father   . Diabetes Father   . Stroke Father   . Depression Father   . Alcohol abuse Maternal Grandfather   . Alcohol abuse Maternal Grandmother   . Alcohol abuse Paternal Grandfather   . Alcohol abuse Paternal Grandmother   . Stroke Mother   . Irritable bowel syndrome Mother   . Sexual abuse Mother   . Diabetes Brother   . Healthy Brother   . Diabetes Brother   . Heart disease Brother   . Anxiety disorder Paternal Uncle   . Alcohol abuse Paternal Uncle   . Alcohol abuse Cousin   . Anxiety disorder Maternal Uncle   . ADD / ADHD Neg Hx   . Bipolar disorder Neg Hx   . Dementia Neg Hx   . Drug abuse Neg Hx   . Paranoid behavior Neg Hx   . Schizophrenia Neg Hx   . Seizures Neg Hx   . Physical abuse Neg Hx     Social History:  Social History   Socioeconomic History  . Marital status: Married    Spouse name: Not on file  . Number of children: Not on file  . Years of education: Not on file  . Highest education level: Not on file  Occupational History  . Not on file  Social Needs  . Financial resource strain: Not on file  . Food insecurity:    Worry: Not on file    Inability: Not on file  . Transportation needs:    Medical: Not on file    Non-medical: Not on file  Tobacco Use  . Smoking status: Former Smoker    Packs/day: 2.00    Years: 15.00    Pack years: 30.00    Types: Cigarettes    Last attempt to quit: 09/05/1982    Years since quitting: 35.7  . Smokeless tobacco: Never Used  Substance and Sexual Activity  . Alcohol use: No    Alcohol/week: 0.0 standard drinks    Comment: history of alcoholism; pt states has been sober 28 years  .  Drug use: No  . Sexual activity: Never  Lifestyle  . Physical activity:    Days per week: Not on file    Minutes per session: Not on file  . Stress: Not on file  Relationships  . Social connections:    Talks on phone: Not on file    Gets together: Not on file    Attends religious service: Not on file    Active member of club or organization: Not on  file    Attends meetings of clubs or organizations: Not on file    Relationship status: Not on file  Other Topics Concern  . Not on file  Social History Narrative  . Not on file    Allergies:  Allergies  Allergen Reactions  . Elavil [Amitriptyline] Other (See Comments)    Vivid dreams and almost suicidal  . Flagyl [Metronidazole] Itching  . Vistaril [Hydroxyzine Hcl] Other (See Comments)    Got higher than a kite on too much of this.  [place] [Ziprasidone Hydrochloride] Other (See Comments)    Blacked out  . Lactose Intolerance (Gi) Other (See Comments)    GI upset  . Latex Other (See Comments)    Red at site    Metabolic Disorder Labs: Lab Results  Component Value Date   HGBA1C 6.1 (H) 10/13/2015   MPG 128 10/13/2015   No results found for: PROLACTIN No results found for: CHOL, TRIG, HDL, CHOLHDL, VLDL, LDLCALC Lab Results  Component Value Date   TSH 2.400 03/19/2014    Therapeutic Level Labs: No results found for: LITHIUM No results found for: VALPROATE No components found for:  CBMZ  Current Medications: Current Outpatient Medications  Medication Sig Dispense Refill  . albuterol (PROAIR HFA) 108 (90 BASE) MCG/ACT inhaler Inhale 2 puffs into the lungs every 4 (four) hours as needed for wheezing or shortness of breath.    . Biotin w/ Vitamins C & E (HAIR/SKIN/NAILS PO) Take 2 tablets by mouth daily.    . budesonide-formoterol (SYMBICORT) 160-4.5 MCG/ACT inhaler Inhale 2 puffs into the lungs 2 (two) times daily.    . calcium-vitamin D (OSCAL WITH D) 500-200 MG-UNIT per tablet Take 1 tablet by mouth 2 (two)  times daily. For low calcium (Patient taking differently: Take 1 tablet daily by mouth. For low calcium)    . Coenzyme Q10 (COQ10) 100 MG CAPS Take 1 capsule daily by mouth.    . Cyanocobalamin (B-12) 2000 MCG TABS Take 2,000 mcg by mouth daily.    . diclofenac sodium (VOLTAREN) 1 % GEL Apply 2 g topically 4 (four) times daily as needed (For pain.).     Marland Kitchen dicyclomine (BENTYL) 20 MG tablet Take 1 tablet (20 mg total) by mouth every 6 (six) hours as needed for spasms. 90 tablet 2  . diphenoxylate-atropine (LOMOTIL) 2.5-0.025 MG tablet Take 1 tablet by mouth 3 (three) times daily as needed. (Patient taking differently: Take 1 tablet by mouth 3 (three) times daily as needed for diarrhea or loose stools. ) 60 tablet 1  . gabapentin (NEURONTIN) 300 MG capsule Take 1 capsule (300 mg total) by mouth 3 (three) times daily. Gabapentin 300 mg Protocol Take a 300 mg capsule three times a day for two weeks following surgery. Then take a 300 mg capsule two times a day for two weeks.  Then take a 300 mg capsule once a day for two weeks.  Then resume Gabapentin as you were previously taking. 84 capsule 0  . Krill Oil 350 MG CAPS Take 350 mg by mouth daily.    Marland Kitchen lamoTRIgine (LAMICTAL) 100 MG tablet Take one half in the am and one in the evening 135 tablet 2  . LORazepam (ATIVAN) 1 MG tablet Take 1.5 tablets (1.5 mg total) by mouth at bedtime. 45 tablet 2  . Misc Natural Products (OSTEO BI-FLEX JOINT SHIELD PO) Take 1 tablet by mouth daily.    . Multiple Vitamins-Minerals (MULTIVITAMIN WITH MINERALS) tablet Take 1 tablet daily by mouth.    Marland Kitchen  pravastatin (PRAVACHOL) 80 MG tablet Take 80 mg at bedtime by mouth.     . ranitidine (ZANTAC) 150 MG tablet TAKE 1 TABLET (150 MG TOTAL) BY MOUTH 2 (TWO) TIMES DAILY. 60 tablet 2  . tiZANidine (ZANAFLEX) 4 MG tablet Take 1 tablet (4 mg total) by mouth every 6 (six) hours as needed for muscle spasms. 40 tablet 0  . Turmeric 450 MG CAPS Take 450 mg by mouth daily.    Marland Kitchen  venlafaxine XR (EFFEXOR XR) 75 MG 24 hr capsule Take 1 capsule (75 mg total) by mouth 2 (two) times daily. 180 capsule 2  . vitamin E 400 UNIT capsule Take 400 Units daily by mouth.     No current facility-administered medications for this visit.      Musculoskeletal: Strength & Muscle Tone: within normal limits Gait & Station: normal Patient leans: N/A  Psychiatric Specialty Exam: Review of Systems  Musculoskeletal: Positive for joint pain and myalgias.  All other systems reviewed and are negative.   Blood pressure 125/77, pulse 88, height 5\' 6"  (1.676 m), weight 152 lb (68.9 kg), SpO2 93 %.Body mass index is 24.53 kg/m.  General Appearance: Casual and Fairly Groomed  Eye Contact:  Good  Speech:  Clear and Coherent  Volume:  Normal  Mood:  Euthymic  Affect:  Congruent  Thought Process:  Goal Directed  Orientation:  Full (Time, Place, and Person)  Thought Content: Rumination   Suicidal Thoughts:  No  Homicidal Thoughts:  No  Memory:  Immediate;   Good Recent;   Good Remote;   Good  Judgement:  Fair  Insight:  Fair  Psychomotor Activity:  Normal  Concentration:  Concentration: Fair and Attention Span: Fair  Recall:  Good  Fund of Knowledge: Good  Language: Good  Akathisia:  No  Handed:  Right  AIMS (if indicated): not done  Assets:  Communication Skills Desire for Improvement Resilience Social Support Talents/Skills  ADL's:  Intact  Cognition: WNL  Sleep:  Good   Screenings: AUDIT     Admission (Discharged) from 03/18/2014 in [facility]  Alcohol Use Disorder Identification Test Final Score (AUDIT)  0       Assessment and Plan: Patient is a 67 year old female with a history of bipolar disorder.  Despite the stress from her husband's illness she is doing well.  She will continue with Effexor XR 75 mg twice daily for depression, Lamictal 50 mg every morning and 100 mg at bedtime for mood stabilization, and Ativan 1.5 mg at  bedtime for sleep.  She will return to see me in 3 months   Levonne Spiller, MD 06/12/2018, 2:25 PM

## 2018-06-14 DIAGNOSIS — M62552 Muscle wasting and atrophy, not elsewhere classified, left thigh: Secondary | ICD-10-CM | POA: Diagnosis not present

## 2018-06-14 DIAGNOSIS — M25662 Stiffness of left knee, not elsewhere classified: Secondary | ICD-10-CM | POA: Diagnosis not present

## 2018-06-14 DIAGNOSIS — M25462 Effusion, left knee: Secondary | ICD-10-CM | POA: Diagnosis not present

## 2018-06-14 DIAGNOSIS — M25562 Pain in left knee: Secondary | ICD-10-CM | POA: Diagnosis not present

## 2018-06-18 DIAGNOSIS — M25662 Stiffness of left knee, not elsewhere classified: Secondary | ICD-10-CM | POA: Diagnosis not present

## 2018-06-18 DIAGNOSIS — M62552 Muscle wasting and atrophy, not elsewhere classified, left thigh: Secondary | ICD-10-CM | POA: Diagnosis not present

## 2018-06-18 DIAGNOSIS — M25462 Effusion, left knee: Secondary | ICD-10-CM | POA: Diagnosis not present

## 2018-06-18 DIAGNOSIS — M25562 Pain in left knee: Secondary | ICD-10-CM | POA: Diagnosis not present

## 2018-06-20 ENCOUNTER — Ambulatory Visit (INDEPENDENT_AMBULATORY_CARE_PROVIDER_SITE_OTHER): Payer: Medicare Other | Admitting: Psychiatry

## 2018-06-20 DIAGNOSIS — F411 Generalized anxiety disorder: Secondary | ICD-10-CM

## 2018-06-20 DIAGNOSIS — F332 Major depressive disorder, recurrent severe without psychotic features: Secondary | ICD-10-CM | POA: Diagnosis not present

## 2018-06-20 DIAGNOSIS — M62552 Muscle wasting and atrophy, not elsewhere classified, left thigh: Secondary | ICD-10-CM | POA: Diagnosis not present

## 2018-06-20 DIAGNOSIS — M25462 Effusion, left knee: Secondary | ICD-10-CM | POA: Diagnosis not present

## 2018-06-20 DIAGNOSIS — M25662 Stiffness of left knee, not elsewhere classified: Secondary | ICD-10-CM | POA: Diagnosis not present

## 2018-06-20 DIAGNOSIS — M25562 Pain in left knee: Secondary | ICD-10-CM | POA: Diagnosis not present
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
## 2018-07-12 DIAGNOSIS — H524 Presbyopia: Secondary | ICD-10-CM | POA: Diagnosis not present

## 2018-07-12 DIAGNOSIS — E119 Type 2 diabetes mellitus without complications: Secondary | ICD-10-CM | POA: Diagnosis not present

## 2018-07-12 DIAGNOSIS — H5213 Myopia, bilateral: Secondary | ICD-10-CM | POA: Diagnosis not present

## 2018-07-12 DIAGNOSIS — E1129 Type 2 diabetes mellitus with other diabetic kidney complication: Secondary | ICD-10-CM | POA: Diagnosis not present

## 2018-07-12 DIAGNOSIS — H52203 Unspecified astigmatism, bilateral: Secondary | ICD-10-CM | POA: Diagnosis not present

## 2018-07-12 DIAGNOSIS — Z961 Presence of intraocular lens: Secondary | ICD-10-CM | POA: Diagnosis not present

## 2018-07-13 ENCOUNTER — Ambulatory Visit (HOSPITAL_COMMUNITY): Payer: Self-pay | Admitting: Psychiatry

## 2018-07-31 ENCOUNTER — Other Ambulatory Visit (HOSPITAL_COMMUNITY): Payer: Self-pay | Admitting: Psychiatry

## 2018-08-06 ENCOUNTER — Ambulatory Visit (INDEPENDENT_AMBULATORY_CARE_PROVIDER_SITE_OTHER): Payer: Medicare Other | Admitting: Psychiatry

## 2018-08-06 ENCOUNTER — Encounter (HOSPITAL_COMMUNITY): Payer: Self-pay | Admitting: Psychiatry

## 2018-08-06 DIAGNOSIS — F332 Major depressive disorder, recurrent severe without psychotic features: Secondary | ICD-10-CM | POA: Diagnosis not present
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
## 2018-08-20 ENCOUNTER — Ambulatory Visit (INDEPENDENT_AMBULATORY_CARE_PROVIDER_SITE_OTHER): Payer: Medicare Other | Admitting: Psychiatry

## 2018-08-20 ENCOUNTER — Encounter (HOSPITAL_COMMUNITY): Payer: Self-pay | Admitting: Psychiatry

## 2018-08-20 DIAGNOSIS — F332 Major depressive disorder, recurrent severe without psychotic features: Secondary | ICD-10-CM

## 2018-08-20 DIAGNOSIS — F411 Generalized anxiety disorder: Secondary | ICD-10-CM | POA: Diagnosis not present

## 2018-08-20 NOTE — Progress Notes (Signed)
   THERAPIST PROGRESS NOTE  Session Time: Monday 08/20/2018 11:03 AM - 11:50 AM   Participation Level: Active  Behavioral Response: CasualAlertAnxious and Depressed  Type of Therapy: Individual Therapy  Treatment Goals addressed:  learn and implement assertiveness skills, improve emotional regulation skills, identify/challenge/and replace depressive thoughts with healthy alternative  Interventions: CBT and Supportive  Summary: Lauren [name] is a 67 y.o. female who is referred for services due to stress, anxiety, and depression. She has participated in therapy intermittently for past 25 years. She reports 3 psychiatric hospitalizations with the last one occuring 4 years ago at [facility]. She reports husband says he is dying as he has bladder cancer. She reports husband is addicted to pain medicine and stays in a vegetative state. She states having rage about this. She also reports guilt related to  leaving his feet in a medic bath too long as his feet now are damaged. She reports taking care of husband is stressful and and says she can't let anyone come to her home because of his condition. She says they don't go anywhere. She also expresses anger because diagnosis and treatment of my husband's bladder cancer was delayed. It had spread to lymph nodes before diagnosed.  Patient reports feeling better since last session. She has increased involvement in activity including attending church, going to an [place], and going to the [facility] about 1 x a week. She also has been riding exercise bike at home and taking dog for a walk. She reports improved mood and now feeling better about taking care of self. She still worries about a variety of issues and states feeling she needs to be perfect. She denies any SI since last session. She says she has not talked with friend about taking guns away as husband is against this as he fears someone may break in on them.   She does express concern  husband suffers from depression at times and may try to harm self.  Therapist and patient discuss steps to take and calling 911 should husband become suicidal.   Suicidal/Homicidal: None  Therapist Response: reviewed symptoms, discussed stressors, facilitated expression of thoughts and feelings, validated feelings, praised and reinforced patient's increased involvement in activity, discussed effects, explored thoughts evoking anxiety, discussed different ways to relate to thoughts, used cognitive defusion to cope with ruminating thoughts, used mindfulness to bring attention to patient's experience in the moment to cope with discomfort   Plan: Return again in 2 weeks.  Diagnosis: Axis I: MDD, Recurrent, Severe    Axis II: Deferred    Lauren Opie, LCSW 08/20/2018

## 2018-08-21 ENCOUNTER — Other Ambulatory Visit (INDEPENDENT_AMBULATORY_CARE_PROVIDER_SITE_OTHER): Payer: Self-pay | Admitting: Internal Medicine

## 2018-08-21 DIAGNOSIS — B078 Other viral warts: Secondary | ICD-10-CM | POA: Diagnosis not present

## 2018-08-21 DIAGNOSIS — Z1283 Encounter for screening for malignant neoplasm of skin: Secondary | ICD-10-CM | POA: Diagnosis not present

## 2018-08-21 DIAGNOSIS — L258 Unspecified contact dermatitis due to other agents: Secondary | ICD-10-CM | POA: Diagnosis not present

## 2018-08-21 DIAGNOSIS — D225 Melanocytic nevi of trunk: Secondary | ICD-10-CM | POA: Diagnosis not present

## 2018-08-21 DIAGNOSIS — R197 Diarrhea, unspecified: Secondary | ICD-10-CM

## 2018-08-22 DIAGNOSIS — M79671 Pain in right foot: Secondary | ICD-10-CM | POA: Diagnosis not present

## 2018-08-22 DIAGNOSIS — M9261 Juvenile osteochondrosis of tarsus, right ankle: Secondary | ICD-10-CM | POA: Diagnosis not present

## 2018-09-10 ENCOUNTER — Ambulatory Visit (INDEPENDENT_AMBULATORY_CARE_PROVIDER_SITE_OTHER): Payer: Medicare Other | Admitting: Psychiatry

## 2018-09-10 DIAGNOSIS — F411 Generalized anxiety disorder: Secondary | ICD-10-CM

## 2018-09-10 DIAGNOSIS — F332 Major depressive disorder, recurrent severe without psychotic features: Secondary | ICD-10-CM
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
## 2018-09-12 ENCOUNTER — Ambulatory Visit (HOSPITAL_COMMUNITY): Payer: Medicare Other | Admitting: Psychiatry

## 2018-09-19 ENCOUNTER — Encounter (HOSPITAL_COMMUNITY): Payer: Self-pay | Admitting: Psychiatry

## 2018-09-19 ENCOUNTER — Ambulatory Visit (INDEPENDENT_AMBULATORY_CARE_PROVIDER_SITE_OTHER): Payer: Medicare Other | Admitting: Psychiatry

## 2018-09-19 DIAGNOSIS — F332 Major depressive disorder, recurrent severe without psychotic features: Secondary | ICD-10-CM | POA: Diagnosis not present

## 2018-09-19 NOTE — Progress Notes (Signed)
   THERAPIST PROGRESS NOTE  Session Time:  Wednesday 09/19/2018 2:00 PM -  2:50 PM                       Participation Level: Active  Behavioral Response: CasualAlertAnxious and Depressed  Type of Therapy: Individual Therapy  Treatment Goals addressed:  learn and implement assertiveness skills, improve emotional regulation skills, identify/challenge/and replace depressive thoughts with healthy alternative  Interventions: CBT and Supportive  Summary: Lauren [name] is a 68 y.o. female who is referred for services due to stress, anxiety, and depression. She has participated in therapy intermittently for past 25 years. She reports 3 psychiatric hospitalizations with the last one occuring 4 years ago at [facility]. She reports husband says he is dying as he has bladder cancer. She reports husband is addicted to pain medicine and stays in a vegetative state. She states having rage about this. She also reports guilt related to  leaving his feet in a medic bath too long as his feet now are damaged. She reports taking care of husband is stressful and and says she can't let anyone come to her home because of his condition. She says they don't go anywhere. She also expresses anger because diagnosis and treatment of my husband's bladder cancer was delayed. It had spread to lymph nodes before diagnosed.  Patient reports continued stress since last session but coping better. She reports going out with a friend shopping this past weekend and enjoying having a break from being at home. She reports being able to cope with ruminating thoughts in a more helpful way. She continues to have concerns about husband's health but is starting to become more aware of her thoughts and interaction with him. She expresses increased acceptance of her inability to change him and has more realistic expectations. She is relieved he is receptive to respite care from [place] and anticipates this starting soon.    Suicidal/Homicidal: None  Therapist Response: reviewed symptoms, discussed stressors, facilitated expression of thoughts and feelings, validated feelings, praised and reinforced patient's efforts to cope with ruminating thoughts and still pursue nurturing activities, discussed effects, discussed observing brain and thinking brain, used cognitive defusion to cope with negative thoughts about self.    Plan: Return again in 2 weeks.  Diagnosis: Axis I: MDD, Recurrent, Severe    Axis II: Deferred    [name], LCSW 09/19/2018

## 2018-09-20 ENCOUNTER — Encounter (HOSPITAL_COMMUNITY): Payer: Self-pay | Admitting: Psychiatry

## 2018-09-20 ENCOUNTER — Ambulatory Visit (INDEPENDENT_AMBULATORY_CARE_PROVIDER_SITE_OTHER): Payer: Medicare Other | Admitting: Psychiatry

## 2018-09-20 VITALS — BP 156/80 | HR 110 | Ht 66.0 in | Wt 153.0 lb

## 2018-09-20 DIAGNOSIS — F332 Major depressive disorder, recurrent severe without psychotic features: Secondary | ICD-10-CM | POA: Diagnosis not present

## 2018-09-20 DIAGNOSIS — F411 Generalized anxiety disorder: Secondary | ICD-10-CM | POA: Diagnosis not present

## 2018-09-20 MED ORDER — VENLAFAXINE HCL ER 75 MG PO CP24: 75.0000 mg | ORAL_CAPSULE | Freq: Two times a day (BID) | ORAL | 2 refills | Status: DC

## 2018-09-20 MED ORDER — LORAZEPAM 1 MG PO TABS: 1.5000 mg | ORAL_TABLET | Freq: Every day | ORAL | 2 refills | Status: DC

## 2018-09-20 MED ORDER — LAMOTRIGINE 100 MG PO TABS: ORAL_TABLET | ORAL | 2 refills | Status: DC

## 2018-09-20 NOTE — Progress Notes (Signed)
[place] MD/PA/NP OP Progress Note  09/20/2018 2:59 PM Lauren [name]  MRN:  [id number]  Chief Complaint:  Chief Complaint    Depression; Anxiety; Manic Behavior; Follow-up     HPI: This patient is a 68 year old married white female who lives with her husband. She has one stepchild. Her step son killed himself 9 years ago.Marland Kitchen He was an alcoholic. The patient is on disability for fibromyalgia  The patient has a long history of depression. She was last hospitalized in 2010 after she took too many pain pills and got confused. She use to drink heavily but has been sober 23 years and is very active in [employer] and church. For the most part she feels her mood is stable. She sleeping pretty well. Her energy is good. She has a hard time functioning without Ativan but she only usually takes one pill a day side think this is reasonable. She tried a higher dose of Cymbalta but it made her manic and revved up  The patient returns for follow-up after 3 months.  She states that she went through a bad bout of poison ivy over Christmas but she is over it now.  She was on prednisone for short time and it "revved her up."  She is back to her self again.  Her husband is having bleeding through his urostomy bag and he has to have surgery next week and she is very worried about this.  She is concerned that his bladder cancer may have spread into the kidney.  They really want no till he gets to the surgery.  For the most part she is sleeping okay her mood is good and she denies anxiety or substance use. Visit Diagnosis:    ICD-10-CM   1. Major depressive disorder, recurrent, severe without psychotic features ([place]) F33.2   2. Generalized anxiety disorder F41.1     Past Psychiatric History: Long history of depression and mood instability.  She was last hospitalized about 4 years ago for suicidal ideation.  Past Medical History:  Past Medical History:  Diagnosis Date  . Anxiety   . Chronic pain   . COPD (chronic obstructive  pulmonary disease) ([place])   . Depression   . Fibromyalgia   . Fibromyalgia   . GERD (gastroesophageal reflux disease)   . Hepatitis C   . History of blood transfusion   . History of bronchitis   . IBS (irritable bowel syndrome)   . Osteoarthritis   . Pneumonia   . Prediabetes   . Shortness of breath dyspnea    allergies; increased pain;   . Vertigo   . Wears glasses     Past Surgical History:  Procedure Laterality Date  . CATARACT EXTRACTION W/PHACO Right 08/01/2017   Procedure: CATARACT EXTRACTION PHACO AND INTRAOCULAR LENS PLACEMENT RIGHT EYE;  Surgeon: Rutherford Guys, MD;  Location: [facility];  Service: Ophthalmology;  Laterality: Right;  CDE: 3.30  . CATARACT EXTRACTION W/PHACO Left 08/15/2017   Procedure: CATARACT EXTRACTION PHACO AND INTRAOCULAR LENS PLACEMENT (IOC);  Surgeon: Rutherford Guys, MD;  Location: [facility];  Service: Ophthalmology;  Laterality: Left;  CDE: 4.00  . CHOLECYSTECTOMY    . COLONOSCOPY    . COLONOSCOPY N/A 10/23/2014   Procedure: COLONOSCOPY;  Surgeon: Rogene Houston, MD;  Location: [facility];  Service: Endoscopy;  Laterality: N/A;  1030  . ESOPHAGOGASTRODUODENOSCOPY N/A 10/23/2014   Procedure: ESOPHAGOGASTRODUODENOSCOPY (EGD);  Surgeon: Rogene Houston, MD;  Location: [facility];  Service: Endoscopy;  Laterality: N/A;  .  KNEE ARTHROSCOPY Left 10/21/2015   Procedure: ARTHROSCOPY LEFT KNEE WITH MENICAL DEBRIDEMENT, Chondroplasty;  Surgeon: Gaynelle Arabian, MD;  Location: [facility];  Service: Orthopedics;  Laterality: Left;  Marland Kitchen MANDIBLE SURGERY  09/06/1979  . ORIF WRIST FRACTURE Right 07/23/2013   Procedure: OPEN REDUCTION INTERNAL FIXATION (ORIF) WRIST FRACTURE;  Surgeon: Roseanne Kaufman, MD;  Location: [facility];  Service: Orthopedics;  Laterality: Right;  . TONSILLECTOMY    . TOTAL KNEE ARTHROPLASTY     2011 rt knee  . TOTAL KNEE ARTHROPLASTY Left 04/16/2018   Procedure: LEFT TOTAL KNEE ARTHROPLASTY;  Surgeon: Gaynelle Arabian, MD;  Location: [facility];  Service:  Orthopedics;  Laterality: Left;  . UPPER GASTROINTESTINAL ENDOSCOPY      Family Psychiatric History: See below  Family History:  Family History  Problem Relation Age of Onset  . Alcohol abuse Father   . Diabetes Father   . Stroke Father   . Depression Father   . Alcohol abuse Maternal Grandfather   . Alcohol abuse Maternal Grandmother   . Alcohol abuse Paternal Grandfather   . Alcohol abuse Paternal Grandmother   . Stroke Mother   . Irritable bowel syndrome Mother   . Sexual abuse Mother   . Diabetes Brother   . Healthy Brother   . Diabetes Brother   . Heart disease Brother   . Anxiety disorder Paternal Uncle   . Alcohol abuse Paternal Uncle   . Alcohol abuse Cousin   . Anxiety disorder Maternal Uncle   . ADD / ADHD Neg Hx   . Bipolar disorder Neg Hx   . Dementia Neg Hx   . Drug abuse Neg Hx   . Paranoid behavior Neg Hx   . Schizophrenia Neg Hx   . Seizures Neg Hx   . Physical abuse Neg Hx     Social History:  Social History   Socioeconomic History  . Marital status: Married    Spouse name: Not on file  . Number of children: Not on file  . Years of education: Not on file  . Highest education level: Not on file  Occupational History  . Not on file  Social Needs  . Financial resource strain: Not on file  . Food insecurity:    Worry: Not on file    Inability: Not on file  . Transportation needs:    Medical: Not on file    Non-medical: Not on file  Tobacco Use  . Smoking status: Former Smoker    Packs/day: 2.00    Years: 15.00    Pack years: 30.00    Types: Cigarettes    Last attempt to quit: 09/05/1982    Years since quitting: 36.0  . Smokeless tobacco: Never Used  Substance and Sexual Activity  . Alcohol use: No    Alcohol/week: 0.0 standard drinks    Comment: history of alcoholism; pt states has been sober 28 years  . Drug use: No  . Sexual activity: Never  Lifestyle  . Physical activity:    Days per week: Not on file    Minutes per session: Not  on file  . Stress: Not on file  Relationships  . Social connections:    Talks on phone: Not on file    Gets together: Not on file    Attends religious service: Not on file    Active member of club or organization: Not on file    Attends meetings of clubs or organizations: Not on file    Relationship status: Not on  file  Other Topics Concern  . Not on file  Social History Narrative  . Not on file    Allergies:  Allergies  Allergen Reactions  . Elavil [Amitriptyline] Other (See Comments)    Vivid dreams and almost suicidal  . Flagyl [Metronidazole] Itching  . Vistaril [Hydroxyzine Hcl] Other (See Comments)    Got higher than a kite on too much of this.  [place] [Ziprasidone Hydrochloride] Other (See Comments)    Blacked out  . Lactose Intolerance (Gi) Other (See Comments)    GI upset  . Latex Other (See Comments)    Red at site    Metabolic Disorder Labs: Lab Results  Component Value Date   HGBA1C 6.1 (H) 10/13/2015   MPG 128 10/13/2015   No results found for: PROLACTIN No results found for: CHOL, TRIG, HDL, CHOLHDL, VLDL, LDLCALC Lab Results  Component Value Date   TSH 2.400 03/19/2014    Therapeutic Level Labs: No results found for: LITHIUM No results found for: VALPROATE No components found for:  CBMZ  Current Medications: Current Outpatient Medications  Medication Sig Dispense Refill  . albuterol (PROAIR HFA) 108 (90 BASE) MCG/ACT inhaler Inhale 2 puffs into the lungs every 4 (four) hours as needed for wheezing or shortness of breath.    . Biotin w/ Vitamins C & E (HAIR/SKIN/NAILS PO) Take 2 tablets by mouth daily.    . budesonide-formoterol (SYMBICORT) 160-4.5 MCG/ACT inhaler Inhale 2 puffs into the lungs 2 (two) times daily.    . calcium-vitamin D (OSCAL WITH D) 500-200 MG-UNIT per tablet Take 1 tablet by mouth 2 (two) times daily. For low calcium    . Coenzyme Q10 (COQ10) 100 MG CAPS Take 1 capsule daily by mouth.    . Cyanocobalamin (B-12) 2000 MCG  TABS Take 2,000 mcg by mouth daily.    . diclofenac sodium (VOLTAREN) 1 % GEL Apply 2 g topically 4 (four) times daily as needed (For pain.).     Marland Kitchen dicyclomine (BENTYL) 20 MG tablet Take 1 tablet (20 mg total) by mouth every 6 (six) hours as needed for spasms. 90 tablet 2  . diphenoxylate-atropine (LOMOTIL) 2.5-0.025 MG tablet TAKE 1 TABLET BY MOUTH 3 TIMES A DAY AS NEEDED 60 tablet 0  . gabapentin (NEURONTIN) 300 MG capsule Take 1 capsule (300 mg total) by mouth 3 (three) times daily. Gabapentin 300 mg Protocol Take a 300 mg capsule three times a day for two weeks following surgery. Then take a 300 mg capsule two times a day for two weeks.  Then take a 300 mg capsule once a day for two weeks.  Then resume Gabapentin as you were previously taking. 84 capsule 0  . Krill Oil 350 MG CAPS Take 350 mg by mouth daily.    Marland Kitchen lamoTRIgine (LAMICTAL) 100 MG tablet Take one half in the am and one in the evening 135 tablet 2  . LORazepam (ATIVAN) 1 MG tablet Take 1.5 tablets (1.5 mg total) by mouth at bedtime. 45 tablet 2  . Misc Natural Products (OSTEO BI-FLEX JOINT SHIELD PO) Take 1 tablet by mouth daily.    . Multiple Vitamins-Minerals (MULTIVITAMIN WITH MINERALS) tablet Take 1 tablet daily by mouth.    . pravastatin (PRAVACHOL) 80 MG tablet Take 80 mg at bedtime by mouth.     . ranitidine (ZANTAC) 150 MG tablet TAKE 1 TABLET (150 MG TOTAL) BY MOUTH 2 (TWO) TIMES DAILY. 60 tablet 2  . tiZANidine (ZANAFLEX) 4 MG tablet Take 1 tablet (4  mg total) by mouth every 6 (six) hours as needed for muscle spasms. 40 tablet 0  . Turmeric 450 MG CAPS Take 450 mg by mouth daily.    Marland Kitchen venlafaxine XR (EFFEXOR XR) 75 MG 24 hr capsule Take 1 capsule (75 mg total) by mouth 2 (two) times daily. 180 capsule 2  . vitamin E 400 UNIT capsule Take 400 Units daily by mouth.     No current facility-administered medications for this visit.      Musculoskeletal: Strength & Muscle Tone: within normal limits Gait & Station:  normal Patient leans: N/A  Psychiatric Specialty Exam: Review of Systems  Musculoskeletal: Positive for joint pain.  Psychiatric/Behavioral: The patient is nervous/anxious.   All other systems reviewed and are negative.   Blood pressure (!) 156/80, pulse (!) 110, height 5\' 6"  (1.676 m), weight 153 lb (69.4 kg), SpO2 94 %.Body mass index is 24.69 kg/m.  General Appearance: Casual, Neat and Well Groomed  Eye Contact:  Good  Speech:  Clear and Coherent  Volume:  Normal  Mood:  Anxious and Euthymic  Affect:  Appropriate and Congruent  Thought Process:  Goal Directed  Orientation:  Full (Time, Place, and Person)  Thought Content: Rumination   Suicidal Thoughts:  No  Homicidal Thoughts:  No  Memory:  Immediate;   Good Recent;   Good Remote;   Good  Judgement:  Good  Insight:  Good  Psychomotor Activity:  Restlessness  Concentration:  Concentration: Fair and Attention Span: Fair  Recall:  Good  Fund of Knowledge: Good  Language: Good  Akathisia:  No  Handed:  Right  AIMS (if indicated): not done  Assets:  Communication Skills Desire for Improvement Resilience Social Support Talents/Skills  ADL's:  Intact  Cognition: WNL  Sleep:  Good   Screenings: AUDIT     Admission (Discharged) from 03/18/2014 in [facility]  Alcohol Use Disorder Identification Test Final Score (AUDIT)  0    GAD-7     Counselor from 06/20/2018 in [facility]  Total GAD-7 Score  16    PHQ2-9     Counselor from 06/20/2018 in [facility]  PHQ-2 Total Score  3  PHQ-9 Total Score  14       Assessment and Plan: This patient is a 68 year old female with a history of bipolar disorder.  Despite all the stress with her husband's illness she is doing fairly well.  She will continue Lamictal 50 mg in the morning and 100 mg at bedtime for mood stabilization, Lorazepam 1.5 mg at bedtime for  sleep and Effexor XR 75 mg twice daily for depression.  She will return to see me in 3 months   Levonne Spiller, MD 09/20/2018, 2:59 PM

## 2018-10-02 DIAGNOSIS — G894 Chronic pain syndrome: Secondary | ICD-10-CM | POA: Diagnosis not present

## 2018-10-02 DIAGNOSIS — Z1389 Encounter for screening for other disorder: Secondary | ICD-10-CM | POA: Diagnosis not present

## 2018-10-02 DIAGNOSIS — J329 Chronic sinusitis, unspecified: Secondary | ICD-10-CM | POA: Diagnosis not present

## 2018-10-03 ENCOUNTER — Ambulatory Visit (INDEPENDENT_AMBULATORY_CARE_PROVIDER_SITE_OTHER): Payer: Medicare Other | Admitting: Psychiatry

## 2018-10-03 ENCOUNTER — Encounter (HOSPITAL_COMMUNITY): Payer: Self-pay | Admitting: Psychiatry

## 2018-10-03 DIAGNOSIS — F332 Major depressive disorder, recurrent severe without psychotic features: Secondary | ICD-10-CM | POA: Diagnosis not present

## 2018-10-03 DIAGNOSIS — F411 Generalized anxiety disorder: Secondary | ICD-10-CM

## 2018-10-03 NOTE — Progress Notes (Signed)
   THERAPIST PROGRESS NOTE  Session Time:  Wednesday 1/29 /2020 2:10 PM -  2:55 PM                 Participation Level: Active  Behavioral Response: CasualAlert/less anxious/less depressed/ improved mood  Type of Therapy: Individual Therapy  Treatment Goals addressed:  learn and implement assertiveness skills, improve emotional regulation skills, identify/challenge/and replace depressive thoughts with healthy alternative  Interventions: CBT and Supportive  Summary: Lauren [name] is a 68 y.o. female who is referred for services due to stress, anxiety, and depression. She has participated in therapy intermittently for past 25 years. She reports 3 psychiatric hospitalizations with the last one occuring 4 years ago at [facility]. She reports husband says he is dying as he has bladder cancer. She reports husband is addicted to pain medicine and stays in a vegetative state. She states having rage about this. She also reports guilt related to  leaving his feet in a medic bath too long as his feet now are damaged. She reports taking care of husband is stressful and and says she can't let anyone come to her home because of his condition. She says they don't go anywhere. She also expresses anger because diagnosis and treatment of my husband's bladder cancer was delayed. It had spread to lymph nodes before diagnosed.  Patient reports continued stress since last session regarding husband's continued medical issues. However, she reports continuing to use helpful coping skills. She reports using helpful ways to cope with ruminating thoughts about various issues and not staying in her head by implementing strategies discussed last session. She also has continued positive self-care regarding time for self. She has continued to go walking, spend time with a friend, attend AA meetings, and attend church. She still anticipates receiving assistance from [place] regarding respite care for husband  in the near future.  She expresses some concern today about how to manage a situation with husband's grandchildren and giving them monetary gifts.   Suicidal/Homicidal: None  Therapist Response: reviewed symptoms, discussed stressors, facilitated expression of thoughts and feelings, validated feelings, praised and reinforced patient's efforts to cope with ruminating thoughts and still pursue nurturing activities, discussed effects, assisted patient examine her thoughts about situation with husband's grandchildren, assisted her challenge and replace with more helpful thoughts  Plan: Return again in 2 weeks.  Diagnosis: Axis I: MDD, Recurrent, Severe    Axis II: Deferred    [name], LCSW 10/03/2018

## 2018-10-10 ENCOUNTER — Other Ambulatory Visit (HOSPITAL_COMMUNITY): Payer: Self-pay | Admitting: Psychiatry

## 2018-10-12 ENCOUNTER — Other Ambulatory Visit (HOSPITAL_COMMUNITY): Payer: Self-pay | Admitting: Psychiatry

## 2018-10-12 ENCOUNTER — Telehealth (HOSPITAL_COMMUNITY): Payer: Self-pay | Admitting: *Deleted

## 2018-10-12 MED ORDER — LORAZEPAM 1 MG PO TABS: 1.5000 mg | ORAL_TABLET | Freq: Every day | ORAL | 2 refills | Status: DC

## 2018-10-12 NOTE — Telephone Encounter (Signed)
Dr [facility] states they didn't receive this script. Please Resubmit LORazepam (ATIVAN) 1 MG tablet 45 tablet 2 09/20/2018    Sig - Route: Take 1.5 tablets (1.5 mg total) by mouth at bedtime. - Oral   Sent to pharmacy as: LORazepam (ATIVAN) 1 MG tablet   E-Prescribing Status: Receipt confirmed by pharmacy (09/20/2018 2:57 PM EST)

## 2018-10-12 NOTE — Telephone Encounter (Signed)
sent 

## 2018-10-17 ENCOUNTER — Ambulatory Visit (INDEPENDENT_AMBULATORY_CARE_PROVIDER_SITE_OTHER): Payer: Medicare Other | Admitting: Psychiatry

## 2018-10-17 DIAGNOSIS — F332 Major depressive disorder, recurrent severe without psychotic features: Secondary | ICD-10-CM | POA: Diagnosis not present

## 2018-10-17 DIAGNOSIS — F411 Generalized anxiety disorder: Secondary | ICD-10-CM

## 2018-10-17 NOTE — Progress Notes (Signed)
   THERAPIST PROGRESS NOTE  Session Time:  Wednesday 10/17/2018 2:12 PM - 3:02 PM                Participation Level: Active  Behavioral Response: CasualAlert/less depressed/anxious  Type of Therapy: Individual Therapy  Treatment Goals addressed:  learn and implement assertiveness skills, improve emotional regulation skills, identify/challenge/and replace depressive thoughts with healthy alternative  Interventions: CBT and Supportive  Summary: Lauren [name] is a 68 y.o. female who is referred for services due to stress, anxiety, and depression. She has participated in therapy intermittently for past 25 years. She reports 3 psychiatric hospitalizations with the last one occuring 4 years ago at [facility]. She reports husband says he is dying as he has bladder cancer. She reports husband is addicted to pain medicine and stays in a vegetative state. She states having rage about this. She also reports guilt related to  leaving his feet in a medic bath too long as his feet now are damaged. She reports taking care of husband is stressful and and says she can't let anyone come to her home because of his condition. She says they don't go anywhere. She also expresses anger because diagnosis and treatment of my husband's bladder cancer was delayed. It had spread to lymph nodes before diagnosed.  Patient reports continued stress since last session regarding husband's continued medical issues. He is scheduled to have his kidney removed on 10/25/2018. She is worried about upcoming surgery. He recently drove his truck in the early morning hours but was not aware he was driving until he came to himself per her report. She reports additional stress related to having been sick with respiratory issues. [place] but expresses frustration with self that she still experiences anxiety when she arrives home.  Suicidal/Homicidal: None  Therapist Response:  reviewed symptoms, discussed stressors, facilitated expression of thoughts and feelings, validated feelings, praised and reinforced patient's efforts to take breaks and use relaxation techniques, introduced mindfulness and discussed using mindfulness and the window of tolerance to regulate her emotions, provided psychoeduction on benefits of mindfulness, discussed and practiced grounding techniques to use when outside window of tolerance, processed patient's experience practicing grounding techniques, discussed rationale for practicing mindfulness activities to improve skill, discussed mindfulness activities to practice, developed plan with patient to practice one mindfulness activity per day 5 - 10 minutes.  Plan: Return again in 2 weeks.  Diagnosis: Axis I: MDD, Recurrent, Severe    Axis II: Deferred    [name], LCSW 10/17/2018

## 2018-11-01 ENCOUNTER — Ambulatory Visit (INDEPENDENT_AMBULATORY_CARE_PROVIDER_SITE_OTHER): Payer: Medicare Other | Admitting: Psychiatry

## 2018-11-01 ENCOUNTER — Encounter (HOSPITAL_COMMUNITY): Payer: Self-pay | Admitting: Psychiatry

## 2018-11-01 DIAGNOSIS — F332 Major depressive disorder, recurrent severe without psychotic features: Secondary | ICD-10-CM

## 2018-11-01 DIAGNOSIS — F411 Generalized anxiety disorder: Secondary | ICD-10-CM | POA: Diagnosis not present

## 2018-11-01 NOTE — Progress Notes (Signed)
   THERAPIST PROGRESS NOTE  Session Time:  Thursday 11/01/2018 2:55 PM - 3:45 PM          Participation Level: Active  Behavioral Response: CasualAlert/less depressed/ less anxious  Type of Therapy: Individual Therapy  Treatment Goals addressed:  learn and implement assertiveness skills, improve emotional regulation skills, identify/challenge/and replace depressive thoughts with healthy alternative  Interventions: CBT and Supportive  Summary: Lauren [name] is a 68 y.o. female who is referred for services due to stress, anxiety, and depression. She has participated in therapy intermittently for past 25 years. She reports 3 psychiatric hospitalizations with the last one occuring 4 years ago at [facility]. She reports husband says he is dying as he has bladder cancer. She reports husband is addicted to pain medicine and stays in a vegetative state. She states having rage about this. She also reports guilt related to  leaving his feet in a medic bath too long as his feet now are damaged. She reports taking care of husband is stressful and and says she can't let anyone come to her home because of his condition. She says they don't go anywhere. She also expresses anger because diagnosis and treatment of my husband's bladder cancer was delayed. It had spread to lymph nodes before diagnosed.  Patient reports stress related to husband having kidney removed on 10/25/2018. He experienced complications triggered by his continued use of nicotine prior to surgery per her report. He remains in ICU and is being treated for pneumonia as well. He is on the ventilator and has feeding tube. He is becoming more responsive and she reports he is having a much better day today. Patient states coping much better than she thought she would. She has been using mindfulness and has become much more aware and accepting of the present moment and her experience rather than struggling. She reports being  able to more easily recognize when she is becoming upset and successfully has used grounding techniques along with her spirituality. She has maintained positive self-care regarding nutrition,exercise, and sleep. She reports strong support from friends and hospital staff. She reports she doesn't know when husband will be discharged but states she is taking it day by day  .Suicidal/Homicidal: None  Therapist Response: reviewed symptoms, discussed stressors, facilitated expression of thoughts and feelings, validated feelings, praised and reinforced patient's use of mindfulness and helpful coping techniques, discussed effects on thoughts/mood/behavior, assisted patient identify ways to maintain efforts and use of helpful coping techniques, encouraged patient to continue positive self-care efforts   Plan: Return again in 2 weeks.  Diagnosis: Axis I: MDD, Recurrent, Severe    Axis II: Deferred    [name], LCSW 11/01/2018

## 2018-11-15 ENCOUNTER — Ambulatory Visit (INDEPENDENT_AMBULATORY_CARE_PROVIDER_SITE_OTHER): Payer: Medicare Other | Admitting: Psychiatry

## 2018-11-15 ENCOUNTER — Other Ambulatory Visit: Payer: Self-pay

## 2018-11-15 DIAGNOSIS — F332 Major depressive disorder, recurrent severe without psychotic features: Secondary | ICD-10-CM | POA: Diagnosis not present

## 2018-11-15 NOTE — Progress Notes (Signed)
   THERAPIST PROGRESS NOTE  Session Time:  Thursday 11/15/2018 1:14 PM -  2:05 PM              Participation Level: Active  Behavioral Response: CasualAlert/less depressed/ less anxious  Type of Therapy: Individual Therapy  Treatment Goals addressed:  learn and implement assertiveness skills, improve emotional regulation skills, identify/challenge/and replace depressive thoughts with healthy alternative  Interventions: CBT and Supportive  Summary: Lauren [name] is a 68 y.o. female who is referred for services due to stress, anxiety, and depression. She has participated in therapy intermittently for past 25 years. She reports 3 psychiatric hospitalizations with the last one occuring 4 years ago at [facility]. She reports husband says he is dying as he has bladder cancer. She reports husband is addicted to pain medicine and stays in a vegetative state. She states having rage about this. She also reports guilt related to  leaving his feet in a medic bath too long as his feet now are damaged. She reports taking care of husband is stressful and and says she can't let anyone come to her home because of his condition. She says they don't go anywhere. She also expresses anger because diagnosis and treatment of my husband's bladder cancer was delayed. It had spread to lymph nodes before diagnosed.  Patient reports continued stress related to husband's health issues. She is relieved he now is closer to home as he was discharged from hospital on 11/09/2018 and now is in the [facility]. She reports she has continued to use mindfulness strategies and grounding techniques to cope with stress. She is pleased with way she has been managing stress and reports decreased anxiety. She expresses some frustration with self as she had conflict with a nurse at the hospital and did not manage it as well as she thought she should have. She reports continued positive self-care regarding eating  patterns and exercise but says she is having difficulty with sleep patterns. She states doing well regarding a sleep routine while she was with husband when he was hospitalized but resuming old patterns of staying up late and having inconsistent schedule now that she is home. Patient continues to report strong support from friends and now receiving strong support from nursing home staff. She also continues to rely on her spirituality.   Suicidal/Homicidal: None  Therapist Response: reviewed symptoms, discussed stressors, facilitated expression of thoughts and feelings, validated feelings, praised and reinforced patient's use of mindfulness and helpful coping techniques, processed patient's experience regarding conflict with nurse, praised and reinforced her use of assertiveness skills, assisted patient examine her thought patterns and used cognitive defusion to assist patient cope with ruminating judgmental thoughts about her response during the conflict, reviewed the role of sleep hygiene in managing stress and depression, discussed and provided patient with handout "Guidelines to Better Sleep", assisted patient develop plan to improve sleep hygiene including establishing bedtime ritual and consistent bed time, using phone alarm to signal time to begin bedtime ritual, assisted patient identify and address thoughts and processes that may inhibit completion of plan,   Plan: Return again in 2 weeks.  Diagnosis: Axis I: MDD, Recurrent, Severe    Axis II: Deferred    [name], LCSW 11/15/2018

## 2018-11-22 DIAGNOSIS — G894 Chronic pain syndrome: Secondary | ICD-10-CM | POA: Diagnosis not present

## 2018-11-22 DIAGNOSIS — I10 Essential (primary) hypertension: Secondary | ICD-10-CM | POA: Diagnosis not present

## 2018-11-22 DIAGNOSIS — E1140 Type 2 diabetes mellitus with diabetic neuropathy, unspecified: Secondary | ICD-10-CM | POA: Diagnosis not present

## 2018-11-25 ENCOUNTER — Other Ambulatory Visit (INDEPENDENT_AMBULATORY_CARE_PROVIDER_SITE_OTHER): Payer: Self-pay | Admitting: Internal Medicine

## 2018-12-07 ENCOUNTER — Ambulatory Visit (HOSPITAL_COMMUNITY): Payer: Medicare Other | Admitting: Psychiatry

## 2018-12-20 ENCOUNTER — Other Ambulatory Visit: Payer: Self-pay

## 2018-12-20 ENCOUNTER — Ambulatory Visit (HOSPITAL_COMMUNITY): Payer: Medicare Other | Admitting: Psychiatry

## 2018-12-20 ENCOUNTER — Encounter (HOSPITAL_COMMUNITY): Payer: Self-pay | Admitting: Psychiatry

## 2018-12-20 ENCOUNTER — Ambulatory Visit (INDEPENDENT_AMBULATORY_CARE_PROVIDER_SITE_OTHER): Payer: Medicare Other | Admitting: Psychiatry

## 2018-12-20 DIAGNOSIS — F411 Generalized anxiety disorder: Secondary | ICD-10-CM

## 2018-12-20 DIAGNOSIS — F332 Major depressive disorder, recurrent severe without psychotic features: Secondary | ICD-10-CM | POA: Diagnosis not present

## 2018-12-20 MED ORDER — VENLAFAXINE HCL ER 75 MG PO CP24: 225.0000 mg | ORAL_CAPSULE | Freq: Every day | ORAL | 2 refills | Status: DC

## 2018-12-20 MED ORDER — LORAZEPAM 1 MG PO TABS: 1.0000 mg | ORAL_TABLET | Freq: Three times a day (TID) | ORAL | 2 refills | Status: DC | PRN

## 2018-12-20 MED ORDER — LAMOTRIGINE 100 MG PO TABS: ORAL_TABLET | ORAL | 2 refills | Status: DC

## 2018-12-20 NOTE — Progress Notes (Signed)
Virtual Visit via Telephone Note  I connected with Lauren [name] on 12/20/18 at  2:00 PM EDT by telephone and verified that I am speaking with the correct person using two identifiers.   I discussed the limitations, risks, security and privacy concerns of performing an evaluation and management service by telephone and the availability of in person appointments. I also discussed with the patient that there may be a patient responsible charge related to this service. The patient expressed understanding and agreed to proceed.    I discussed the assessment and treatment plan with the patient. The patient was provided an opportunity to ask questions and all were answered. The patient agreed with the plan and demonstrated an understanding of the instructions.   The patient was advised to call back or seek an in-person evaluation if the symptoms worsen or if the condition fails to improve as anticipated.  I provided 15 minutes of non-face-to-face time during this encounter.   Levonne Spiller, MD  [facility] MD/PA/NP OP Progress Note  12/20/2018 2:38 PM Lauren [name]  MRN:  [id number]  Chief Complaint:  Chief Complaint    Depression; Anxiety; Follow-up     HPI: This patient is a 68 year old married white female who lives with her husband. She has one stepchild. Her step son killed himself9 years ago.Marland Kitchen He was an alcoholic. The patient is on disability for fibromyalgia  The patient has a long history of depression. She was last hospitalized in 2010 after she took too many pain pills and got confused. She use to drink heavily but has been sober 23 years and is very active in [employer] and church. For the most part she feels her mood is stable. She sleeping pretty well. Her energy is good. She has a hard time functioning without Ativan but she only usually takes one pill a day side think this is reasonable. She tried a higher dose of Cymbalta but it made her manic and revved up  The patient returns for  follow-up after 3 months via telephone visit because of the coronavirus pandemic.  She states that since I saw her husband was hospitalized in February to have his kidney removed due to cancer.  He had a turn for the worse with a lot of bleeding and had to be ventilated in ICU for 8 days.  For a while he was very confused.  He was in a nursing home for a couple of weeks but then the patient took him out because of the coronavirus.  He has been home and slowly recovering and is now walking on his own but still has some moments of confusion.  The patient is very upset because he is making poor decisions like returning to cigarette smoking and trying to visit with people in town.  She is very anxious and tearful today.  She states that she is also depressed and worried.  She is not suicidal and denies manic symptoms like severe racing thoughts or confusion.  I suggested we go up in both the Ativan and Effexor and she agrees. Visit Diagnosis:    ICD-10-CM   1. Major depressive disorder, recurrent, severe without psychotic features ([place]) F33.2   2. Generalized anxiety disorder F41.1     Past Psychiatric History: Long history of depression and mood instability.  She was last hospitalized about 4 years ago for suicidal ideation  Past Medical History:  Past Medical History:  Diagnosis Date  . Anxiety   . Chronic pain   . COPD (  chronic obstructive pulmonary disease) ([place])   . Depression   . Fibromyalgia   . Fibromyalgia   . GERD (gastroesophageal reflux disease)   . Hepatitis C   . History of blood transfusion   . History of bronchitis   . IBS (irritable bowel syndrome)   . Osteoarthritis   . Pneumonia   . Prediabetes   . Shortness of breath dyspnea    allergies; increased pain;   . Vertigo   . Wears glasses     Past Surgical History:  Procedure Laterality Date  . CATARACT EXTRACTION W/PHACO Right 08/01/2017   Procedure: CATARACT EXTRACTION PHACO AND INTRAOCULAR LENS PLACEMENT RIGHT EYE;   Surgeon: Rutherford Guys, MD;  Location: [facility];  Service: Ophthalmology;  Laterality: Right;  CDE: 3.30  . CATARACT EXTRACTION W/PHACO Left 08/15/2017   Procedure: CATARACT EXTRACTION PHACO AND INTRAOCULAR LENS PLACEMENT (IOC);  Surgeon: Rutherford Guys, MD;  Location: [facility];  Service: Ophthalmology;  Laterality: Left;  CDE: 4.00  . CHOLECYSTECTOMY    . COLONOSCOPY    . COLONOSCOPY N/A 10/23/2014   Procedure: COLONOSCOPY;  Surgeon: Rogene Houston, MD;  Location: [facility];  Service: Endoscopy;  Laterality: N/A;  1030  . ESOPHAGOGASTRODUODENOSCOPY N/A 10/23/2014   Procedure: ESOPHAGOGASTRODUODENOSCOPY (EGD);  Surgeon: Rogene Houston, MD;  Location: [facility];  Service: Endoscopy;  Laterality: N/A;  . KNEE ARTHROSCOPY Left 10/21/2015   Procedure: ARTHROSCOPY LEFT KNEE WITH MENICAL DEBRIDEMENT, Chondroplasty;  Surgeon: Gaynelle Arabian, MD;  Location: [facility];  Service: Orthopedics;  Laterality: Left;  Marland Kitchen MANDIBLE SURGERY  09/06/1979  . ORIF WRIST FRACTURE Right 07/23/2013   Procedure: OPEN REDUCTION INTERNAL FIXATION (ORIF) WRIST FRACTURE;  Surgeon: Roseanne Kaufman, MD;  Location: [facility];  Service: Orthopedics;  Laterality: Right;  . TONSILLECTOMY    . TOTAL KNEE ARTHROPLASTY     2011 rt knee  . TOTAL KNEE ARTHROPLASTY Left 04/16/2018   Procedure: LEFT TOTAL KNEE ARTHROPLASTY;  Surgeon: Gaynelle Arabian, MD;  Location: [facility];  Service: Orthopedics;  Laterality: Left;  . UPPER GASTROINTESTINAL ENDOSCOPY      Family Psychiatric History: See below  Family History:  Family History  Problem Relation Age of Onset  . Alcohol abuse Father   . Diabetes Father   . Stroke Father   . Depression Father   . Alcohol abuse Maternal Grandfather   . Alcohol abuse Maternal Grandmother   . Alcohol abuse Paternal Grandfather   . Alcohol abuse Paternal Grandmother   . Stroke Mother   . Irritable bowel syndrome Mother   . Sexual abuse Mother   . Diabetes Brother   . Healthy Brother   . Diabetes Brother   .  Heart disease Brother   . Anxiety disorder Paternal Uncle   . Alcohol abuse Paternal Uncle   . Alcohol abuse Cousin   . Anxiety disorder Maternal Uncle   . ADD / ADHD Neg Hx   . Bipolar disorder Neg Hx   . Dementia Neg Hx   . Drug abuse Neg Hx   . Paranoid behavior Neg Hx   . Schizophrenia Neg Hx   . Seizures Neg Hx   . Physical abuse Neg Hx     Social History:  Social History   Socioeconomic History  . Marital status: Married    Spouse name: Not on file  . Number of children: Not on file  . Years of education: Not on file  . Highest education level: Not on file  Occupational History  . Not  on file  Social Needs  . Financial resource strain: Not on file  . Food insecurity:    Worry: Not on file    Inability: Not on file  . Transportation needs:    Medical: Not on file    Non-medical: Not on file  Tobacco Use  . Smoking status: Former Smoker    Packs/day: 2.00    Years: 15.00    Pack years: 30.00    Types: Cigarettes    Last attempt to quit: 09/05/1982    Years since quitting: 36.3  . Smokeless tobacco: Never Used  Substance and Sexual Activity  . Alcohol use: No    Alcohol/week: 0.0 standard drinks    Comment: history of alcoholism; pt states has been sober 28 years  . Drug use: No  . Sexual activity: Never  Lifestyle  . Physical activity:    Days per week: Not on file    Minutes per session: Not on file  . Stress: Not on file  Relationships  . Social connections:    Talks on phone: Not on file    Gets together: Not on file    Attends religious service: Not on file    Active member of club or organization: Not on file    Attends meetings of clubs or organizations: Not on file    Relationship status: Not on file  Other Topics Concern  . Not on file  Social History Narrative  . Not on file    Allergies:  Allergies  Allergen Reactions  . Elavil [Amitriptyline] Other (See Comments)    Vivid dreams and almost suicidal  . Flagyl [Metronidazole] Itching   . Vistaril [Hydroxyzine Hcl] Other (See Comments)    Got higher than a kite on too much of this.  [place] [Ziprasidone Hydrochloride] Other (See Comments)    Blacked out  . Lactose Intolerance (Gi) Other (See Comments)    GI upset  . Latex Other (See Comments)    Red at site    Metabolic Disorder Labs: Lab Results  Component Value Date   HGBA1C 6.1 (H) 10/13/2015   MPG 128 10/13/2015   No results found for: PROLACTIN No results found for: CHOL, TRIG, HDL, CHOLHDL, VLDL, LDLCALC Lab Results  Component Value Date   TSH 2.400 03/19/2014    Therapeutic Level Labs: No results found for: LITHIUM No results found for: VALPROATE No components found for:  CBMZ  Current Medications: Current Outpatient Medications  Medication Sig Dispense Refill  . albuterol (PROAIR HFA) 108 (90 BASE) MCG/ACT inhaler Inhale 2 puffs into the lungs every 4 (four) hours as needed for wheezing or shortness of breath.    . Biotin w/ Vitamins C & E (HAIR/SKIN/NAILS PO) Take 2 tablets by mouth daily.    . budesonide-formoterol (SYMBICORT) 160-4.5 MCG/ACT inhaler Inhale 2 puffs into the lungs 2 (two) times daily.    . calcium-vitamin D (OSCAL WITH D) 500-200 MG-UNIT per tablet Take 1 tablet by mouth 2 (two) times daily. For low calcium    . Coenzyme Q10 (COQ10) 100 MG CAPS Take 1 capsule daily by mouth.    . Cyanocobalamin (B-12) 2000 MCG TABS Take 2,000 mcg by mouth daily.    . diclofenac sodium (VOLTAREN) 1 % GEL Apply 2 g topically 4 (four) times daily as needed (For pain.).     Marland Kitchen dicyclomine (BENTYL) 20 MG tablet Take 1 tablet (20 mg total) by mouth every 6 (six) hours as needed for spasms. 90 tablet 2  .  diphenoxylate-atropine (LOMOTIL) 2.5-0.025 MG tablet TAKE 1 TABLET BY MOUTH 3 TIMES A DAY AS NEEDED 60 tablet 0  . gabapentin (NEURONTIN) 300 MG capsule Take 1 capsule (300 mg total) by mouth 3 (three) times daily. Gabapentin 300 mg Protocol Take a 300 mg capsule three times a day for two weeks  following surgery. Then take a 300 mg capsule two times a day for two weeks.  Then take a 300 mg capsule once a day for two weeks.  Then resume Gabapentin as you were previously taking. 84 capsule 0  . Krill Oil 350 MG CAPS Take 350 mg by mouth daily.    Marland Kitchen lamoTRIgine (LAMICTAL) 100 MG tablet Take one half in the am and one in the evening 135 tablet 2  . LORazepam (ATIVAN) 1 MG tablet Take 1 tablet (1 mg total) by mouth 3 (three) times daily as needed for anxiety. 90 tablet 2  . Misc Natural Products (OSTEO BI-FLEX JOINT SHIELD PO) Take 1 tablet by mouth daily.    . Multiple Vitamins-Minerals (MULTIVITAMIN WITH MINERALS) tablet Take 1 tablet daily by mouth.    . pravastatin (PRAVACHOL) 80 MG tablet Take 80 mg at bedtime by mouth.     . ranitidine (ZANTAC) 150 MG tablet TAKE 1 TABLET BY MOUTH TWICE A DAY 180 tablet 3  . tiZANidine (ZANAFLEX) 4 MG tablet Take 1 tablet (4 mg total) by mouth every 6 (six) hours as needed for muscle spasms. 40 tablet 0  . Turmeric 450 MG CAPS Take 450 mg by mouth daily.    Marland Kitchen venlafaxine XR (EFFEXOR XR) 75 MG 24 hr capsule Take 3 capsules (225 mg total) by mouth daily. 270 capsule 2  . vitamin E 400 UNIT capsule Take 400 Units daily by mouth.     No current facility-administered medications for this visit.      Musculoskeletal: Strength & Muscle Tone: Not assessed, phone visit Gait & Station:  Patient leans:   Psychiatric Specialty Exam: Review of Systems  Musculoskeletal: Positive for joint pain.  Psychiatric/Behavioral: Positive for depression. The patient is nervous/anxious.   All other systems reviewed and are negative.   There were no vitals taken for this visit.There is no height or weight on file to calculate BMI.  General Appearance: NA  Eye Contact:  NA  Speech:  Clear and Coherent  Volume:  Normal  Mood:  Anxious and Dysphoric  Affect:  NA  Thought Process:  Goal Directed  Orientation:  Full (Time, Place, and Person)  Thought Content:  Rumination   Suicidal Thoughts:  No  Homicidal Thoughts:  No  Memory:  Immediate;   Good Recent;   Good Remote;   Fair  Judgement:  Good  Insight:  Good  Psychomotor Activity:  Restlessness  Concentration:  Concentration: Good and Attention Span: Good  Recall:  Good  Fund of Knowledge: Good  Language: Good  Akathisia:  No  Handed:  Right  AIMS (if indicated): not done  Assets:  Communication Skills Desire for Improvement Resilience Social Support Talents/Skills  ADL's:  Intact  Cognition: WNL  Sleep:  Good   Screenings: AUDIT     Admission (Discharged) from 03/18/2014 in [facility]  Alcohol Use Disorder Identification Test Final Score (AUDIT)  0    GAD-7     Counselor from 06/20/2018 in [facility]  Total GAD-7 Score  16    PHQ2-9     Counselor from 06/20/2018 in [place]  [facility]  PHQ-2 Total Score  3  PHQ-9 Total Score  14       Assessment and Plan: This patient is a 68 year old female with depression anxiety, possibly bipolar disorder.  Given all the stressors with her husband and the coronavirus pandemic she is getting  distraught anxious and depressed.  She will increase Ativan to 1 mg 3 times daily as needed for anxiety, increase Effexor XR to 225 mg daily and continue Lamictal at 50 mg every morning and 100 mg nightly for mood stabilization.  She will return to see me in 4 weeks   Levonne Spiller, MD 12/20/2018, 2:38 PM

## 2018-12-27 ENCOUNTER — Ambulatory Visit (INDEPENDENT_AMBULATORY_CARE_PROVIDER_SITE_OTHER): Payer: Medicare Other | Admitting: Psychiatry

## 2018-12-27 ENCOUNTER — Other Ambulatory Visit: Payer: Self-pay

## 2018-12-27 DIAGNOSIS — F411 Generalized anxiety disorder: Secondary | ICD-10-CM | POA: Diagnosis not present

## 2018-12-27 DIAGNOSIS — F332 Major depressive disorder, recurrent severe without psychotic features: Secondary | ICD-10-CM | POA: Diagnosis not present

## 2018-12-27 NOTE — Progress Notes (Signed)
Virtual Visit via Telephone Note  I connected with Lauren [name] on 12/27/18 at 3:20 PM EDT by telephone and verified that I am speaking with the correct person using two identifiers.   I discussed the limitations, risks, security and privacy concerns of performing an evaluation and management service by telephone and the availability of in person appointments. I also discussed with the patient that there may be a patient responsible charge related to this service. The patient expressed understanding and agreed to proceed.    I provided 35 minutes of non-face-to-face time during this encounter.   [name], LCSW            THERAPIST PROGRESS NOTE  Session Time:  Thursday 12/27/2018 3:20 PM - 3:55 PM             Participation Level: Active  Behavioral Response: CasualAlert/less depressed/ less anxious  Type of Therapy: Individual Therapy  Treatment Goals addressed:  learn and implement assertiveness skills, improve emotional regulation skills, identify/challenge/and replace depressive thoughts with healthy alternative  Interventions: CBT and Supportive  Summary: Lauren [name] is a 68 y.o. female who is referred for services due to stress, anxiety, and depression. She has participated in therapy intermittently for past 25 years. She reports 3 psychiatric hospitalizations with the last one occuring 4 years ago at [facility]. She reports husband says he is dying as he has bladder cancer. She reports husband is addicted to pain medicine and stays in a vegetative state. She states having rage about this. She also reports guilt related to  leaving his feet in a medic bath too long as his feet now are damaged. She reports taking care of husband is stressful and and says she can't let anyone come to her home because of his condition. She says they don't go anywhere. She also expresses anger because diagnosis and treatment of my husband's bladder cancer was delayed. It had  spread to lymph nodes before diagnosed.  Patient last was seen 5 weeks ago. She reports  stress related to husband's health and impact of coronavirus pandemic. She has taken husband home from nursing home and now is providing his care. There has been some adjustment issues regarding this. She expresses frustration husband has resumed smoking despite his health. She reports initially resuming old patterns of coping but was able to recognize/observe self with use of mindfulness. This resulted in patient responding in more helpful ways. She has continued to use spirituality, listening to music, riding her bike, and meditation to cope. She expresses appropriate concern about pandemic but is not overwhelmed. She reports improved sleep patten but states adjusting it to her current situation.i Suicidal/Homicidal: None  Therapist Response: reviewed symptoms, discussed stressors, facilitated expression of thoughts and feelings, validated feelings, praised and reinforced patient's use of mindfulness and helpful coping techniques, encouraged patient to maintain positive self-care   Plan: Return again in 2 weeks.  Diagnosis: Axis I: MDD, Recurrent, Severe    Axis II: Deferred    [name], LCSW 12/27/2018

## 2019-01-10 ENCOUNTER — Other Ambulatory Visit: Payer: Self-pay

## 2019-01-10 ENCOUNTER — Ambulatory Visit (HOSPITAL_COMMUNITY): Payer: Medicare Other | Admitting: Psychiatry

## 2019-01-16 DIAGNOSIS — I10 Essential (primary) hypertension: Secondary | ICD-10-CM | POA: Diagnosis not present

## 2019-01-16 DIAGNOSIS — Z0001 Encounter for general adult medical examination with abnormal findings: Secondary | ICD-10-CM | POA: Diagnosis not present

## 2019-01-16 DIAGNOSIS — K219 Gastro-esophageal reflux disease without esophagitis: Secondary | ICD-10-CM | POA: Diagnosis not present

## 2019-01-16 DIAGNOSIS — K589 Irritable bowel syndrome without diarrhea: Secondary | ICD-10-CM | POA: Diagnosis not present

## 2019-01-16 DIAGNOSIS — E1129 Type 2 diabetes mellitus with other diabetic kidney complication: Secondary | ICD-10-CM | POA: Diagnosis not present

## 2019-01-16 DIAGNOSIS — Z1389 Encounter for screening for other disorder: Secondary | ICD-10-CM | POA: Diagnosis not present

## 2019-01-24 ENCOUNTER — Ambulatory Visit (HOSPITAL_COMMUNITY): Payer: Medicare Other | Admitting: Psychiatry

## 2019-01-24 ENCOUNTER — Other Ambulatory Visit: Payer: Self-pay

## 2019-01-31 ENCOUNTER — Ambulatory Visit (HOSPITAL_COMMUNITY): Payer: Medicare Other | Admitting: Psychiatry

## 2019-01-31 ENCOUNTER — Other Ambulatory Visit: Payer: Self-pay

## 2019-02-07 ENCOUNTER — Telehealth (HOSPITAL_COMMUNITY): Payer: Self-pay | Admitting: Psychiatry

## 2019-02-07 ENCOUNTER — Ambulatory Visit (HOSPITAL_COMMUNITY): Payer: Medicare Other | Admitting: Psychiatry

## 2019-02-07 ENCOUNTER — Other Ambulatory Visit: Payer: Self-pay

## 2019-02-07 NOTE — Telephone Encounter (Signed)
Therapist attempted to contact patient on mobile phone for scheduled appointment after calling home phone. There was a notification on home phone that indicated no one was able to answer but a voice mailbox was not available. Therapist left message on mobile phone indicating efforts to contact patient for scheduled appointment and requesting patient to call office to reschedule.

## 2019-03-05 DIAGNOSIS — I10 Essential (primary) hypertension: Secondary | ICD-10-CM | POA: Diagnosis not present

## 2019-03-05 DIAGNOSIS — E119 Type 2 diabetes mellitus without complications: Secondary | ICD-10-CM | POA: Diagnosis not present

## 2019-03-05 DIAGNOSIS — Z1389 Encounter for screening for other disorder: Secondary | ICD-10-CM | POA: Diagnosis not present

## 2019-03-05 DIAGNOSIS — G894 Chronic pain syndrome: Secondary | ICD-10-CM | POA: Diagnosis not present

## 2019-03-05 DIAGNOSIS — J329 Chronic sinusitis, unspecified: Secondary | ICD-10-CM | POA: Diagnosis not present

## 2019-03-05 DIAGNOSIS — Z7689 Persons encountering health services in other specified circumstances: Secondary | ICD-10-CM | POA: Diagnosis not present

## 2019-03-21 ENCOUNTER — Other Ambulatory Visit: Payer: Self-pay

## 2019-03-21 DIAGNOSIS — Z20822 Contact with and (suspected) exposure to covid-19: Secondary | ICD-10-CM

## 2019-03-22 DIAGNOSIS — Z13220 Encounter for screening for lipoid disorders: Secondary | ICD-10-CM | POA: Diagnosis not present

## 2019-03-22 DIAGNOSIS — L719 Rosacea, unspecified: Secondary | ICD-10-CM | POA: Diagnosis not present

## 2019-03-22 DIAGNOSIS — R202 Paresthesia of skin: Secondary | ICD-10-CM | POA: Diagnosis not present

## 2019-03-22 DIAGNOSIS — G894 Chronic pain syndrome: Secondary | ICD-10-CM | POA: Diagnosis not present

## 2019-03-22 DIAGNOSIS — E1140 Type 2 diabetes mellitus with diabetic neuropathy, unspecified: Secondary | ICD-10-CM | POA: Diagnosis not present

## 2019-03-22 DIAGNOSIS — I10 Essential (primary) hypertension: Secondary | ICD-10-CM | POA: Diagnosis not present

## 2019-03-22 DIAGNOSIS — K219 Gastro-esophageal reflux disease without esophagitis: Secondary | ICD-10-CM | POA: Diagnosis not present

## 2019-03-22 DIAGNOSIS — Z6824 Body mass index (BMI) 24.0-24.9, adult: Secondary | ICD-10-CM | POA: Diagnosis not present

## 2019-03-26 LAB — NOVEL CORONAVIRUS, NAA: SARS-CoV-2, NAA: NOT DETECTED

## 2019-04-02 ENCOUNTER — Telehealth: Payer: Self-pay | Admitting: Internal Medicine

## 2019-04-02 NOTE — Telephone Encounter (Signed)
Pt aware covid lab test negative, not detected °

## 2019-05-10 DIAGNOSIS — Z23 Encounter for immunization: Secondary | ICD-10-CM | POA: Diagnosis not present

## 2019-05-24 DIAGNOSIS — Z6824 Body mass index (BMI) 24.0-24.9, adult: Secondary | ICD-10-CM | POA: Diagnosis not present

## 2019-05-24 DIAGNOSIS — I10 Essential (primary) hypertension: Secondary | ICD-10-CM | POA: Diagnosis not present

## 2019-05-24 DIAGNOSIS — K219 Gastro-esophageal reflux disease without esophagitis: Secondary | ICD-10-CM | POA: Diagnosis not present

## 2019-05-28 ENCOUNTER — Other Ambulatory Visit (HOSPITAL_COMMUNITY): Payer: Self-pay | Admitting: Psychiatry

## 2019-05-28 MED ORDER — VENLAFAXINE HCL ER 75 MG PO CP24: 225.0000 mg | ORAL_CAPSULE | Freq: Every day | ORAL | 2 refills | Status: DC

## 2019-05-28 MED ORDER — LORAZEPAM 1 MG PO TABS: 1.0000 mg | ORAL_TABLET | Freq: Three times a day (TID) | ORAL | 2 refills | Status: DC | PRN

## 2019-05-28 MED ORDER — LAMOTRIGINE 100 MG PO TABS: ORAL_TABLET | ORAL | 2 refills | Status: DC

## 2019-06-03 ENCOUNTER — Other Ambulatory Visit: Payer: Self-pay

## 2019-06-03 NOTE — Patient Outreach (Signed)
[facility]) Care Management  06/03/2019  NYJAI HESKETH [birth year] [id number]   Medication Adherence call to Mrs. Aundra Millet HIPPA Compliant Voice message left with a call back number. Lauren [name] is showing past due on Pravastatin 80 mg under [place].   [facility] Direct Dial [phone]  Fax [phone] [email or web address]

## 2019-06-05 DIAGNOSIS — E1129 Type 2 diabetes mellitus with other diabetic kidney complication: Secondary | ICD-10-CM | POA: Diagnosis not present

## 2019-06-05 DIAGNOSIS — E7849 Other hyperlipidemia: Secondary | ICD-10-CM | POA: Diagnosis not present

## 2019-06-05 DIAGNOSIS — G894 Chronic pain syndrome: Secondary | ICD-10-CM | POA: Diagnosis not present

## 2019-06-05 DIAGNOSIS — I10 Essential (primary) hypertension: Secondary | ICD-10-CM | POA: Diagnosis not present

## 2019-06-25 ENCOUNTER — Other Ambulatory Visit (HOSPITAL_COMMUNITY): Payer: Self-pay | Admitting: Psychiatry

## 2019-07-03 ENCOUNTER — Ambulatory Visit (HOSPITAL_COMMUNITY): Payer: Medicare Other | Admitting: Psychiatry

## 2019-07-06 DIAGNOSIS — I10 Essential (primary) hypertension: Secondary | ICD-10-CM | POA: Diagnosis not present

## 2019-07-06 DIAGNOSIS — E782 Mixed hyperlipidemia: Secondary | ICD-10-CM | POA: Diagnosis not present

## 2019-07-08 ENCOUNTER — Other Ambulatory Visit: Payer: Self-pay

## 2019-07-08 ENCOUNTER — Ambulatory Visit (HOSPITAL_COMMUNITY): Payer: Medicare Other | Admitting: Psychiatry

## 2019-07-18 ENCOUNTER — Other Ambulatory Visit: Payer: Self-pay

## 2019-07-18 ENCOUNTER — Ambulatory Visit (INDEPENDENT_AMBULATORY_CARE_PROVIDER_SITE_OTHER): Payer: Medicare Other | Admitting: Psychiatry

## 2019-07-18 ENCOUNTER — Encounter (HOSPITAL_COMMUNITY): Payer: Self-pay | Admitting: Psychiatry

## 2019-07-18 DIAGNOSIS — F411 Generalized anxiety disorder: Secondary | ICD-10-CM

## 2019-07-18 DIAGNOSIS — F332 Major depressive disorder, recurrent severe without psychotic features: Secondary | ICD-10-CM | POA: Diagnosis not present

## 2019-07-18 MED ORDER — VENLAFAXINE HCL ER 75 MG PO CP24: 225.0000 mg | ORAL_CAPSULE | Freq: Every day | ORAL | 2 refills | Status: DC

## 2019-07-18 MED ORDER — LORAZEPAM 1 MG PO TABS: ORAL_TABLET | ORAL | 2 refills | Status: DC

## 2019-07-18 MED ORDER — LAMOTRIGINE 100 MG PO TABS: ORAL_TABLET | ORAL | 2 refills | Status: DC

## 2019-07-18 NOTE — Progress Notes (Signed)
Virtual Visit via Telephone Note  I connected with Lauren [name] on 07/18/19 at  3:00 PM EST by telephone and verified that I am speaking with the correct person using two identifiers.   I discussed the limitations, risks, security and privacy concerns of performing an evaluation and management service by telephone and the availability of in person appointments. I also discussed with the patient that there may be a patient responsible charge related to this service. The patient expressed understanding and agreed to proceed.    I discussed the assessment and treatment plan with the patient. The patient was provided an opportunity to ask questions and all were answered. The patient agreed with the plan and demonstrated an understanding of the instructions.   The patient was advised to call back or seek an in-person evaluation if the symptoms worsen or if the condition fails to improve as anticipated.  I provided 15 minutes of non-face-to-face time during this encounter.   Levonne Spiller, MD  [facility] MD/PA/NP OP Progress Note  07/18/2019 3:34 PM Lauren [name]  MRN:  [id number]  Chief Complaint: depression, anxiety HPI:HPI: This patient is a 68 year old married white female who lives with her husband. She has one stepchild. Her step son killed himself9 years ago.Marland Kitchen He was an alcoholic. The patient is on disability for fibromyalgia  The patient has a long history of depression. She was last hospitalized in 2010 after she took too many pain pills and got confused. She use to drink heavily but has been sober 23 years and is very active in [employer] and church. For the most part she feels her mood is stable. She sleeping pretty well. Her energy is good. She has a hard time functioning without Ativan but she only usually takes one pill a day side think this is reasonable. She tried a higher dose of Cymbalta but it made her manic and revved up  The patient returns for follow-up after 3 months.  She states  she has been very upset because her husband had hip replacement surgery back in June and he still will comply with physical therapy.  He is really not walking much and still is fighting with her every day about trying to get her to give him more of his pain medicine.  Right now he is in the hospital because he has had severe constipation because of the pain medicine.  She states that she is at her wits end with him but she is going to try to keep him away from these meds.  She is trying to take care of herself by walking and riding on her stationary bike.  She is sleeping fairly well when he is not keeping her up.  She does think the medications are still helping with her anxiety and depression symptoms. Visit Diagnosis:    ICD-10-CM   1. Major depressive disorder, recurrent, severe without psychotic features ([place])  F33.2   2. Generalized anxiety disorder  F41.1     Past Psychiatric History: Long history of depression and mood instability.  She was last hospitalized about 4 years ago for suicidal ideation  Past Medical History:  Past Medical History:  Diagnosis Date  . Anxiety   . Chronic pain   . COPD (chronic obstructive pulmonary disease) ([place])   . Depression   . Fibromyalgia   . Fibromyalgia   . GERD (gastroesophageal reflux disease)   . Hepatitis C   . History of blood transfusion   . History of bronchitis   .  IBS (irritable bowel syndrome)   . Osteoarthritis   . Pneumonia   . Prediabetes   . Shortness of breath dyspnea    allergies; increased pain;   . Vertigo   . Wears glasses     Past Surgical History:  Procedure Laterality Date  . CATARACT EXTRACTION W/PHACO Right 08/01/2017   Procedure: CATARACT EXTRACTION PHACO AND INTRAOCULAR LENS PLACEMENT RIGHT EYE;  Surgeon: Rutherford Guys, MD;  Location: [facility];  Service: Ophthalmology;  Laterality: Right;  CDE: 3.30  . CATARACT EXTRACTION W/PHACO Left 08/15/2017   Procedure: CATARACT EXTRACTION PHACO AND INTRAOCULAR LENS PLACEMENT  (IOC);  Surgeon: Rutherford Guys, MD;  Location: [facility];  Service: Ophthalmology;  Laterality: Left;  CDE: 4.00  . CHOLECYSTECTOMY    . COLONOSCOPY    . COLONOSCOPY N/A 10/23/2014   Procedure: COLONOSCOPY;  Surgeon: Rogene Houston, MD;  Location: [facility];  Service: Endoscopy;  Laterality: N/A;  1030  . ESOPHAGOGASTRODUODENOSCOPY N/A 10/23/2014   Procedure: ESOPHAGOGASTRODUODENOSCOPY (EGD);  Surgeon: Rogene Houston, MD;  Location: [facility];  Service: Endoscopy;  Laterality: N/A;  . KNEE ARTHROSCOPY Left 10/21/2015   Procedure: ARTHROSCOPY LEFT KNEE WITH MENICAL DEBRIDEMENT, Chondroplasty;  Surgeon: Gaynelle Arabian, MD;  Location: [facility];  Service: Orthopedics;  Laterality: Left;  Marland Kitchen MANDIBLE SURGERY  09/06/1979  . ORIF WRIST FRACTURE Right 07/23/2013   Procedure: OPEN REDUCTION INTERNAL FIXATION (ORIF) WRIST FRACTURE;  Surgeon: Roseanne Kaufman, MD;  Location: [facility];  Service: Orthopedics;  Laterality: Right;  . TONSILLECTOMY    . TOTAL KNEE ARTHROPLASTY     2011 rt knee  . TOTAL KNEE ARTHROPLASTY Left 04/16/2018   Procedure: LEFT TOTAL KNEE ARTHROPLASTY;  Surgeon: Gaynelle Arabian, MD;  Location: [facility];  Service: Orthopedics;  Laterality: Left;  . UPPER GASTROINTESTINAL ENDOSCOPY      Family Psychiatric History: See below  Family History:  Family History  Problem Relation Age of Onset  . Alcohol abuse Father   . Diabetes Father   . Stroke Father   . Depression Father   . Alcohol abuse Maternal Grandfather   . Alcohol abuse Maternal Grandmother   . Alcohol abuse Paternal Grandfather   . Alcohol abuse Paternal Grandmother   . Stroke Mother   . Irritable bowel syndrome Mother   . Sexual abuse Mother   . Diabetes Brother   . Healthy Brother   . Diabetes Brother   . Heart disease Brother   . Anxiety disorder Paternal Uncle   . Alcohol abuse Paternal Uncle   . Alcohol abuse Cousin   . Anxiety disorder Maternal Uncle   . ADD / ADHD Neg Hx   . Bipolar disorder Neg Hx   . Dementia  Neg Hx   . Drug abuse Neg Hx   . Paranoid behavior Neg Hx   . Schizophrenia Neg Hx   . Seizures Neg Hx   . Physical abuse Neg Hx     Social History:  Social History   Socioeconomic History  . Marital status: Married    Spouse name: Not on file  . Number of children: Not on file  . Years of education: Not on file  . Highest education level: Not on file  Occupational History  . Not on file  Social Needs  . Financial resource strain: Not on file  . Food insecurity    Worry: Not on file    Inability: Not on file  . Transportation needs    Medical: Not on file  Non-medical: Not on file  Tobacco Use  . Smoking status: Former Smoker    Packs/day: 2.00    Years: 15.00    Pack years: 30.00    Types: Cigarettes    Quit date: 09/05/1982    Years since quitting: 36.8  . Smokeless tobacco: Never Used  Substance and Sexual Activity  . Alcohol use: No    Alcohol/week: 0.0 standard drinks    Comment: history of alcoholism; pt states has been sober 28 years  . Drug use: No  . Sexual activity: Never  Lifestyle  . Physical activity    Days per week: Not on file    Minutes per session: Not on file  . Stress: Not on file  Relationships  . Social Herbalist on phone: Not on file    Gets together: Not on file    Attends religious service: Not on file    Active member of club or organization: Not on file    Attends meetings of clubs or organizations: Not on file    Relationship status: Not on file  Other Topics Concern  . Not on file  Social History Narrative  . Not on file    Allergies:  Allergies  Allergen Reactions  . Elavil [Amitriptyline] Other (See Comments)    Vivid dreams and almost suicidal  . Flagyl [Metronidazole] Itching  . Vistaril [Hydroxyzine Hcl] Other (See Comments)    Got higher than a kite on too much of this.  [place] [Ziprasidone Hydrochloride] Other (See Comments)    Blacked out  . Lactose Intolerance (Gi) Other (See Comments)    GI  upset  . Latex Other (See Comments)    Red at site    Metabolic Disorder Labs: Lab Results  Component Value Date   HGBA1C 6.1 (H) 10/13/2015   MPG 128 10/13/2015   No results found for: PROLACTIN No results found for: CHOL, TRIG, HDL, CHOLHDL, VLDL, LDLCALC Lab Results  Component Value Date   TSH 2.400 03/19/2014    Therapeutic Level Labs: No results found for: LITHIUM No results found for: VALPROATE No components found for:  CBMZ  Current Medications: Current Outpatient Medications  Medication Sig Dispense Refill  . albuterol (PROAIR HFA) 108 (90 BASE) MCG/ACT inhaler Inhale 2 puffs into the lungs every 4 (four) hours as needed for wheezing or shortness of breath.    . Biotin w/ Vitamins C & E (HAIR/SKIN/NAILS PO) Take 2 tablets by mouth daily.    . budesonide-formoterol (SYMBICORT) 160-4.5 MCG/ACT inhaler Inhale 2 puffs into the lungs 2 (two) times daily.    . calcium-vitamin D (OSCAL WITH D) 500-200 MG-UNIT per tablet Take 1 tablet by mouth 2 (two) times daily. For low calcium    . Coenzyme Q10 (COQ10) 100 MG CAPS Take 1 capsule daily by mouth.    . Cyanocobalamin (B-12) 2000 MCG TABS Take 2,000 mcg by mouth daily.    . diclofenac sodium (VOLTAREN) 1 % GEL Apply 2 g topically 4 (four) times daily as needed (For pain.).     Marland Kitchen dicyclomine (BENTYL) 20 MG tablet Take 1 tablet (20 mg total) by mouth every 6 (six) hours as needed for spasms. 90 tablet 2  . diphenoxylate-atropine (LOMOTIL) 2.5-0.025 MG tablet TAKE 1 TABLET BY MOUTH 3 TIMES A DAY AS NEEDED 60 tablet 0  . gabapentin (NEURONTIN) 300 MG capsule Take 1 capsule (300 mg total) by mouth 3 (three) times daily. Gabapentin 300 mg Protocol Take a 300 mg capsule  three times a day for two weeks following surgery. Then take a 300 mg capsule two times a day for two weeks.  Then take a 300 mg capsule once a day for two weeks.  Then resume Gabapentin as you were previously taking. 84 capsule 0  . Krill Oil 350 MG CAPS Take 350 mg  by mouth daily.    Marland Kitchen lamoTRIgine (LAMICTAL) 100 MG tablet Take one half in the am and one in the evening 135 tablet 2  . LORazepam (ATIVAN) 1 MG tablet TAKE 1 TABLET BY MOUTH 3 TIMES DAILY AS NEEDED FOR ANXIETY. 90 tablet 2  . Misc Natural Products (OSTEO BI-FLEX JOINT SHIELD PO) Take 1 tablet by mouth daily.    . Multiple Vitamins-Minerals (MULTIVITAMIN WITH MINERALS) tablet Take 1 tablet daily by mouth.    . pravastatin (PRAVACHOL) 80 MG tablet Take 80 mg at bedtime by mouth.     . ranitidine (ZANTAC) 150 MG tablet TAKE 1 TABLET BY MOUTH TWICE A DAY 180 tablet 3  . tiZANidine (ZANAFLEX) 4 MG tablet Take 1 tablet (4 mg total) by mouth every 6 (six) hours as needed for muscle spasms. 40 tablet 0  . Turmeric 450 MG CAPS Take 450 mg by mouth daily.    Marland Kitchen venlafaxine XR (EFFEXOR XR) 75 MG 24 hr capsule Take 3 capsules (225 mg total) by mouth daily. 270 capsule 2  . vitamin E 400 UNIT capsule Take 400 Units daily by mouth.     No current facility-administered medications for this visit.      Musculoskeletal: Strength & Muscle Tone: within normal limits Gait & Station: normal Patient leans: N/A  Psychiatric Specialty Exam: Review of Systems  Psychiatric/Behavioral: The patient is nervous/anxious.   All other systems reviewed and are negative.   There were no vitals taken for this visit.There is no height or weight on file to calculate BMI.  General Appearance: NA  Eye Contact:  NA  Speech:  Pressured  Volume:  Increased  Mood:  Anxious  Affect:  NA  Thought Process:  Coherent and Descriptions of Associations: Circumstantial  Orientation:  Full (Time, Place, and Person)  Thought Content: Rumination   Suicidal Thoughts:  No  Homicidal Thoughts:  No  Memory:  Immediate;   Good Recent;   Good Remote;   Good  Judgement:  Good  Insight:  Good  Psychomotor Activity:  Normal  Concentration:  Concentration: Fair and Attention Span: Fair  Recall:  Good  Fund of Knowledge: Good   Language: Good  Akathisia:  No  Handed:  Right  AIMS (if indicated): not done  Assets:  Communication Skills Desire for Improvement Resilience Social Support Talents/Skills  ADL's:  Intact  Cognition: WNL  Sleep:  Fair   Screenings: AUDIT     Admission (Discharged) from 03/18/2014 in [facility]  Alcohol Use Disorder Identification Test Final Score (AUDIT)  0    GAD-7     Counselor from 06/20/2018 in [facility]  Total GAD-7 Score  16    PHQ2-9     Counselor from 06/20/2018 in [facility]  PHQ-2 Total Score  3  PHQ-9 Total Score  14       Assessment and Plan: This patient is a 68 year old female with depression anxiety and possible bipolar disorder.  She seems to be getting up more distraught and dealing with her husband.  For now she will continue Ativan 1 mg 3 times daily  as needed for anxiety, Effexor XR 225 mg daily for depression and Lamictal 50 mg in the morning and 100 mg at bedtime for mood stabilization.  She will return to see me in 3 months and she is also seeing her counselor here.   Levonne Spiller, MD 07/18/2019, 3:34 PM

## 2019-07-19 ENCOUNTER — Ambulatory Visit (INDEPENDENT_AMBULATORY_CARE_PROVIDER_SITE_OTHER): Payer: Medicare Other | Admitting: Psychiatry

## 2019-07-19 ENCOUNTER — Other Ambulatory Visit: Payer: Self-pay

## 2019-07-19 DIAGNOSIS — F332 Major depressive disorder, recurrent severe without psychotic features: Secondary | ICD-10-CM | POA: Diagnosis not present

## 2019-07-19 DIAGNOSIS — F411 Generalized anxiety disorder: Secondary | ICD-10-CM | POA: Diagnosis not present

## 2019-07-19 NOTE — Progress Notes (Signed)
Virtual Visit via Telephone Note      I connected with [name] on 07/19/19 at 11:00 AM EST by telephone and verified that I am speaking with the correct person using two identifiers.   I discussed the limitations, risks, security and privacy concerns of performing an evaluation and management service by telephone and the availability of in person appointments. I also discussed with the patient that there may be a patient responsible charge related to this service. The patient expressed understanding and agreed to proceed.  I provided 45 minutes of non-face-to-face time during this encounter.   Lauren Smoker, LCSW            THERAPIST PROGRESS NOTE  Session Time:  Friday 07/19/2019 11:00 AM - 11:45 AM           Participation Level: Active  Behavioral Response: CasualAlert/ldepressed/ anxious  Type of Therapy: Individual Therapy  Treatment Goals addressed:  learn and implement assertiveness skills, improve emotional regulation skills, identify/challenge/and replace depressive thoughts with healthy alternative  Interventions: CBT and Supportive  Summary: [name] is a 68 y.o. female who is referred for services due to stress, anxiety, and depression. She has participated in therapy intermittently for past 25 years. She reports 3 psychiatric hospitalizations with the last one occuring 4 years ago at [facility]. She reports husband says he is dying as he has bladder cancer. She reports husband is addicted to pain medicine and stays in a vegetative state. She states having rage about this. She also reports guilt related to  leaving his feet in a medic bath too long as his feet now are damaged. She reports taking care of husband is stressful and and says she can't let anyone come to her home because of his condition. She says they don't go anywhere. She also expresses anger because diagnosis and treatment of my husband's bladder cancer was delayed. It had spread  to lymph nodes before diagnosed.  Patient last was seen in June 2020.  She is resuming services due to increased stress and anxiety.  She continues to provide care for her husband who continues to have chronic health issues.  She expresses frustration and resentment as husband is noncompliant with doctors advice and medication.  He has been in and out of the hospital frequently.  Patient reports having little to no time for self and decreased efforts for self-care.  She continues to have support from friends and has decided to attend an [place] women's group starting tonight.    Suicidal/Homicidal: None  Therapist Response: reviewed symptoms, discussed stressors, facilitated expression of thoughts and feelings, validated feelings, praised and reinforced patient's efforts to connect with support system and encouraged her to follow through with attending AA women's group, assisted patient identify ways to improve self-care and self nurture, assisted patient develop a plan to schedule predictable and consistent time for self 1 time per week with help from support system to provide caretaker assistance for husband, reviewed ways to use of mindfulness and helpful coping strategies    Plan: Return again in 2 weeks.  Diagnosis: Axis I: MDD, Recurrent, Severe    Axis II: Deferred    Lauren Smoker, LCSW 07/19/2019

## 2019-07-22 DIAGNOSIS — H52203 Unspecified astigmatism, bilateral: Secondary | ICD-10-CM | POA: Diagnosis not present

## 2019-07-22 DIAGNOSIS — Z961 Presence of intraocular lens: Secondary | ICD-10-CM | POA: Diagnosis not present

## 2019-07-22 DIAGNOSIS — H524 Presbyopia: Secondary | ICD-10-CM | POA: Diagnosis not present

## 2019-07-22 DIAGNOSIS — E119 Type 2 diabetes mellitus without complications: Secondary | ICD-10-CM | POA: Diagnosis not present

## 2019-08-05 DIAGNOSIS — E7849 Other hyperlipidemia: Secondary | ICD-10-CM | POA: Diagnosis not present

## 2019-08-05 DIAGNOSIS — I10 Essential (primary) hypertension: Secondary | ICD-10-CM | POA: Diagnosis not present

## 2019-09-05 DIAGNOSIS — I10 Essential (primary) hypertension: Secondary | ICD-10-CM | POA: Diagnosis not present

## 2019-09-05 DIAGNOSIS — E7849 Other hyperlipidemia: Secondary | ICD-10-CM | POA: Diagnosis not present

## 2019-09-05 DIAGNOSIS — G894 Chronic pain syndrome: Secondary | ICD-10-CM | POA: Diagnosis not present

## 2019-09-24 ENCOUNTER — Emergency Department (HOSPITAL_COMMUNITY): Payer: Medicare Other

## 2019-09-24 ENCOUNTER — Other Ambulatory Visit: Payer: Self-pay

## 2019-09-24 ENCOUNTER — Encounter (HOSPITAL_COMMUNITY): Payer: Self-pay | Admitting: *Deleted

## 2019-09-24 ENCOUNTER — Observation Stay (HOSPITAL_COMMUNITY)
Admission: EM | Admit: 2019-09-24 | Discharge: 2019-09-25 | Disposition: A | Discharge status: Home / Self Care | Payer: Medicare Other | Attending: Internal Medicine | Admitting: Internal Medicine

## 2019-09-24 DIAGNOSIS — Z79899 Other long term (current) drug therapy: Secondary | ICD-10-CM | POA: Insufficient documentation

## 2019-09-24 DIAGNOSIS — Z87891 Personal history of nicotine dependence: Secondary | ICD-10-CM | POA: Insufficient documentation

## 2019-09-24 DIAGNOSIS — Z96652 Presence of left artificial knee joint: Secondary | ICD-10-CM | POA: Diagnosis not present

## 2019-09-24 DIAGNOSIS — K219 Gastro-esophageal reflux disease without esophagitis: Secondary | ICD-10-CM | POA: Diagnosis not present

## 2019-09-24 DIAGNOSIS — K449 Diaphragmatic hernia without obstruction or gangrene: Secondary | ICD-10-CM | POA: Diagnosis not present

## 2019-09-24 DIAGNOSIS — I959 Hypotension, unspecified: Secondary | ICD-10-CM

## 2019-09-24 DIAGNOSIS — F419 Anxiety disorder, unspecified: Secondary | ICD-10-CM | POA: Diagnosis present

## 2019-09-24 DIAGNOSIS — F329 Major depressive disorder, single episode, unspecified: Secondary | ICD-10-CM | POA: Insufficient documentation

## 2019-09-24 DIAGNOSIS — F32A Depression, unspecified: Secondary | ICD-10-CM | POA: Diagnosis present

## 2019-09-24 DIAGNOSIS — N179 Acute kidney failure, unspecified: Secondary | ICD-10-CM | POA: Diagnosis present

## 2019-09-24 DIAGNOSIS — Z20822 Contact with and (suspected) exposure to covid-19: Secondary | ICD-10-CM | POA: Diagnosis not present

## 2019-09-24 DIAGNOSIS — J4489 Other specified chronic obstructive pulmonary disease: Secondary | ICD-10-CM | POA: Diagnosis present

## 2019-09-24 DIAGNOSIS — K582 Mixed irritable bowel syndrome: Secondary | ICD-10-CM

## 2019-09-24 DIAGNOSIS — K589 Irritable bowel syndrome without diarrhea: Secondary | ICD-10-CM | POA: Diagnosis present

## 2019-09-24 DIAGNOSIS — R531 Weakness: Secondary | ICD-10-CM | POA: Diagnosis not present

## 2019-09-24 DIAGNOSIS — R7303 Prediabetes: Secondary | ICD-10-CM | POA: Insufficient documentation

## 2019-09-24 DIAGNOSIS — Z7951 Long term (current) use of inhaled steroids: Secondary | ICD-10-CM | POA: Insufficient documentation

## 2019-09-24 DIAGNOSIS — I700 Atherosclerosis of aorta: Secondary | ICD-10-CM | POA: Diagnosis not present

## 2019-09-24 DIAGNOSIS — R579 Shock, unspecified: Principal | ICD-10-CM | POA: Diagnosis present

## 2019-09-24 DIAGNOSIS — M797 Fibromyalgia: Secondary | ICD-10-CM | POA: Diagnosis present

## 2019-09-24 DIAGNOSIS — I5032 Chronic diastolic (congestive) heart failure: Secondary | ICD-10-CM | POA: Insufficient documentation

## 2019-09-24 DIAGNOSIS — R1030 Lower abdominal pain, unspecified: Secondary | ICD-10-CM | POA: Diagnosis not present

## 2019-09-24 DIAGNOSIS — Z743 Need for continuous supervision: Secondary | ICD-10-CM | POA: Diagnosis not present

## 2019-09-24 DIAGNOSIS — J449 Chronic obstructive pulmonary disease, unspecified: Secondary | ICD-10-CM | POA: Diagnosis not present

## 2019-09-24 DIAGNOSIS — E785 Hyperlipidemia, unspecified: Secondary | ICD-10-CM | POA: Diagnosis not present

## 2019-09-24 DIAGNOSIS — R55 Syncope and collapse: Secondary | ICD-10-CM | POA: Diagnosis not present

## 2019-09-24 DIAGNOSIS — R0689 Other abnormalities of breathing: Secondary | ICD-10-CM | POA: Diagnosis not present

## 2019-09-24 DIAGNOSIS — R1084 Generalized abdominal pain: Secondary | ICD-10-CM | POA: Diagnosis not present

## 2019-09-24 LAB — LACTIC ACID, PLASMA
Lactic Acid, Venous: 2.5 mmol/L (ref 0.5–1.9)
Lactic Acid, Venous: 0.7 mmol/L (ref 0.5–1.9)

## 2019-09-24 LAB — CBC WITH DIFFERENTIAL/PLATELET
Abs Immature Granulocytes: 0.07 10*3/uL (ref 0.00–0.07)
Basophils Absolute: 0.1 10*3/uL (ref 0.0–0.1)
Basophils Relative: 1 %
Eosinophils Absolute: 0.3 10*3/uL (ref 0.0–0.5)
Eosinophils Relative: 3 %
HCT: 44.8 % (ref 36.0–46.0)
Hemoglobin: 14 g/dL (ref 12.0–15.0)
Immature Granulocytes: 1 %
Lymphocytes Relative: 27 %
Lymphs Abs: 2.8 10*3/uL (ref 0.7–4.0)
MCH: 28.4 pg (ref 26.0–34.0)
MCHC: 31.3 g/dL (ref 30.0–36.0)
MCV: 90.9 fL (ref 80.0–100.0)
Monocytes Absolute: 0.5 10*3/uL (ref 0.1–1.0)
Monocytes Relative: 5 %
Neutro Abs: 6.6 10*3/uL (ref 1.7–7.7)
Neutrophils Relative %: 63 %
Platelets: 273 10*3/uL (ref 150–400)
RBC: 4.93 MIL/uL (ref 3.87–5.11)
RDW: 12.8 % (ref 11.5–15.5)
WBC: 10.3 10*3/uL (ref 4.0–10.5)
nRBC: 0 % (ref 0.0–0.2)

## 2019-09-24 LAB — TYPE AND SCREEN
ABO/RH(D): A NEG
Antibody Screen: NEGATIVE

## 2019-09-24 LAB — PROTIME-INR
INR: 1 (ref 0.8–1.2)
Prothrombin Time: 13.1 seconds (ref 11.4–15.2)

## 2019-09-24 LAB — COMPREHENSIVE METABOLIC PANEL
ALT: 21 U/L (ref 0–44)
AST: 27 U/L (ref 15–41)
Albumin: 4.1 g/dL (ref 3.5–5.0)
Alkaline Phosphatase: 54 U/L (ref 38–126)
Anion gap: 9 (ref 5–15)
BUN: 23 mg/dL (ref 8–23)
CO2: 22 mmol/L (ref 22–32)
Calcium: 8.4 mg/dL — ABNORMAL LOW (ref 8.9–10.3)
Chloride: 104 mmol/L (ref 98–111)
Creatinine, Ser: 1.04 mg/dL — ABNORMAL HIGH (ref 0.44–1.00)
GFR calc Af Amer: >60 mL/min (ref >60)
GFR calc non Af Amer: 55 mL/min — ABNORMAL LOW (ref >60)
Glucose, Bld: 135 mg/dL — ABNORMAL HIGH (ref 70–99)
Potassium: 4.3 mmol/L (ref 3.5–5.1)
Sodium: 135 mmol/L (ref 135–145)
Total Bilirubin: 0.5 mg/dL (ref 0.3–1.2)
Total Protein: 7.2 g/dL (ref 6.5–8.1)

## 2019-09-24 LAB — MAGNESIUM: Magnesium: 2.2 mg/dL (ref 1.7–2.4)

## 2019-09-24 LAB — RESPIRATORY PANEL BY RT PCR (FLU A&B, COVID)
Influenza A by PCR: NEGATIVE
Influenza B by PCR: NEGATIVE
SARS Coronavirus 2 by RT PCR: NEGATIVE

## 2019-09-24 LAB — POC SARS CORONAVIRUS 2 AG -  ED: SARS Coronavirus 2 Ag: NEGATIVE

## 2019-09-24 LAB — TROPONIN I (HIGH SENSITIVITY): Troponin I (High Sensitivity): 3 ng/L (ref <18)

## 2019-09-24 MED ORDER — IOHEXOL 350 MG/ML SOLN
100.0000 mL | Freq: Once | INTRAVENOUS | Status: AC | PRN
  Administered 2019-09-24: 100 mL via INTRAVENOUS

## 2019-09-24 MED ORDER — SODIUM CHLORIDE 0.9 % IV BOLUS (SEPSIS)
1000.0000 mL | Freq: Once | INTRAVENOUS | Status: AC
  Administered 2019-09-24: 1000 mL via INTRAVENOUS

## 2019-09-24 MED ORDER — FENTANYL CITRATE (PF) 100 MCG/2ML IJ SOLN
INTRAMUSCULAR | Status: AC
  Filled 2019-09-24: qty 2

## 2019-09-24 MED ORDER — PIPERACILLIN-TAZOBACTAM 3.375 G IVPB 30 MIN
3.3750 g | Freq: Once | INTRAVENOUS | Status: AC
  Administered 2019-09-24: 3.375 g via INTRAVENOUS
  Filled 2019-09-24: qty 50

## 2019-09-24 MED ORDER — FENTANYL CITRATE (PF) 100 MCG/2ML IJ SOLN
50.0000 ug | Freq: Once | INTRAMUSCULAR | Status: AC
  Administered 2019-09-24: 50 ug via INTRAVENOUS

## 2019-09-24 MED ORDER — DEXAMETHASONE SODIUM PHOSPHATE 4 MG/ML IJ SOLN
4.0000 mg | Freq: Once | INTRAMUSCULAR | Status: AC
  Administered 2019-09-24: 4 mg via INTRAVENOUS
  Filled 2019-09-24: qty 1

## 2019-09-24 MED ORDER — ONDANSETRON HCL 4 MG/2ML IJ SOLN
INTRAMUSCULAR | Status: AC
  Filled 2019-09-24: qty 2

## 2019-09-24 MED ORDER — ONDANSETRON HCL 4 MG/2ML IJ SOLN
4.0000 mg | Freq: Once | INTRAMUSCULAR | Status: AC
  Administered 2019-09-24: 4 mg via INTRAVENOUS

## 2019-09-24 MED ORDER — NOREPINEPHRINE 4 MG/250ML-% IV SOLN
0.0000 ug/min | INTRAVENOUS | Status: DC
  Administered 2019-09-24: 2 ug/min via INTRAVENOUS

## 2019-09-24 MED ORDER — NOREPINEPHRINE 4 MG/250ML-% IV SOLN
INTRAVENOUS | Status: AC
  Filled 2019-09-24: qty 250

## 2019-09-24 MED ORDER — VANCOMYCIN HCL IN DEXTROSE 1-5 GM/200ML-% IV SOLN
1000.0000 mg | Freq: Once | INTRAVENOUS | Status: AC
  Administered 2019-09-24: 1000 mg via INTRAVENOUS
  Filled 2019-09-24: qty 200

## 2019-09-24 NOTE — ED Notes (Signed)
Dr Wilson Singer explaining central line placement to pt. Pt Lauren [name].

## 2019-09-24 NOTE — ED Notes (Signed)
Date and time results received: 09/24/19 2239  Test: lactic Critical Value: 2.5  Name of Provider Notified: Wilson Singer, MD  Orders Received? Or Actions Taken?: acknowledged

## 2019-09-24 NOTE — H&P (Signed)
History and Physical    Lauren [name] [id number] DOB: 1951-02-25 DOA: 09/24/2019  PCP: Redmond School, MD   Patient coming from: Home   Chief Complaint: Syncope   HPI: Lauren [name] is a 69 y.o. female with medical history significant for depression, anxiety, fibromyalgia, COPD, IBS, and prediabetes on lisinopril for renal protection, now presenting to the emergency department after syncopal episode.  Patient reports that she frequently has loose stools and constipation attributed to her IBS, had been on the toilet straining to move her bowels, and then had a syncopal episode upon standing.  This was followed by nausea with an episode of nonbloody vomiting.  EMS was called and found the patient to have systolic blood pressure in the 50s.  She was given 500 cc of IV fluid while in route to the hospital, but with no improvement in BP.  Patient denies any recent fevers, chills, cough, dysuria, or flank pain.  She has not had any chest pain or palpitations.  She denies any headache or focal numbness or weakness.  She has never had similar episode previously.  She had already taken her lisinopril this evening.  She had been feeling constipated but has now had a bowel movement in the ED.  ED Course: Upon arrival to the ED, patient is found to be saturating in the mid 80s on room air, normal respiratory rate and normal heart rate, and with systolic blood pressure in the 50s.  EKG features a sinus rhythm.  Chemistry panel notable for creatinine 1.04, up from 0.56 in 2019.  CBC is unremarkable.  Initial lactic acid 2.5.  CTA chest/abdomen/pelvis negative for acute findings.  Covid antigen and PCR tests are both negative.  Blood cultures were collected in the ED, 4 L normal saline administered, and the patient was treated with vancomycin, Zosyn, started on Levophed, and hospitalists consulted for admission.  Review of Systems:  All other systems reviewed and apart from HPI, are negative.  Past  Medical History:  Diagnosis Date  . Anxiety   . Chronic pain   . COPD (chronic obstructive pulmonary disease) ([place])   . Depression   . Fibromyalgia   . Fibromyalgia   . GERD (gastroesophageal reflux disease)   . Hepatitis C   . History of blood transfusion   . History of bronchitis   . IBS (irritable bowel syndrome)   . Osteoarthritis   . Pneumonia   . Prediabetes   . Shortness of breath dyspnea    allergies; increased pain;   . Vertigo   . Wears glasses     Past Surgical History:  Procedure Laterality Date  . CATARACT EXTRACTION W/PHACO Right 08/01/2017   Procedure: CATARACT EXTRACTION PHACO AND INTRAOCULAR LENS PLACEMENT RIGHT EYE;  Surgeon: Rutherford Guys, MD;  Location: [facility];  Service: Ophthalmology;  Laterality: Right;  CDE: 3.30  . CATARACT EXTRACTION W/PHACO Left 08/15/2017   Procedure: CATARACT EXTRACTION PHACO AND INTRAOCULAR LENS PLACEMENT (IOC);  Surgeon: Rutherford Guys, MD;  Location: [facility];  Service: Ophthalmology;  Laterality: Left;  CDE: 4.00  . CHOLECYSTECTOMY    . COLONOSCOPY    . COLONOSCOPY N/A 10/23/2014   Procedure: COLONOSCOPY;  Surgeon: Rogene Houston, MD;  Location: [facility];  Service: Endoscopy;  Laterality: N/A;  1030  . ESOPHAGOGASTRODUODENOSCOPY N/A 10/23/2014   Procedure: ESOPHAGOGASTRODUODENOSCOPY (EGD);  Surgeon: Rogene Houston, MD;  Location: [facility];  Service: Endoscopy;  Laterality: N/A;  . KNEE ARTHROSCOPY Left 10/21/2015   Procedure: ARTHROSCOPY LEFT  KNEE WITH MENICAL DEBRIDEMENT, Chondroplasty;  Surgeon: Gaynelle Arabian, MD;  Location: [facility];  Service: Orthopedics;  Laterality: Left;  Marland Kitchen MANDIBLE SURGERY  09/06/1979  . ORIF WRIST FRACTURE Right 07/23/2013   Procedure: OPEN REDUCTION INTERNAL FIXATION (ORIF) WRIST FRACTURE;  Surgeon: Roseanne Kaufman, MD;  Location: [facility];  Service: Orthopedics;  Laterality: Right;  . TONSILLECTOMY    . TOTAL KNEE ARTHROPLASTY     2011 rt knee  . TOTAL KNEE ARTHROPLASTY Left 04/16/2018   Procedure:  LEFT TOTAL KNEE ARTHROPLASTY;  Surgeon: Gaynelle Arabian, MD;  Location: [facility];  Service: Orthopedics;  Laterality: Left;  . UPPER GASTROINTESTINAL ENDOSCOPY       reports that she quit smoking about 37 years ago. Her smoking use included cigarettes. She has a 30.00 pack-year smoking history. She has never used smokeless tobacco. She reports that she does not drink alcohol or use drugs.  Allergies  Allergen Reactions  . Elavil [Amitriptyline] Other (See Comments)    Vivid dreams and almost suicidal  . Flagyl [Metronidazole] Itching  . Vistaril [Hydroxyzine Hcl] Other (See Comments)    Got higher than a kite on too much of this.  Lindajo Royal [Ziprasidone Hydrochloride] Other (See Comments)    Blacked out  . Lactose Intolerance (Gi) Other (See Comments)    GI upset  . Latex Other (See Comments)    Red at site    Family History  Problem Relation Age of Onset  . Alcohol abuse Father   . Diabetes Father   . Stroke Father   . Depression Father   . Alcohol abuse Maternal Grandfather   . Alcohol abuse Maternal Grandmother   . Alcohol abuse Paternal Grandfather   . Alcohol abuse Paternal Grandmother   . Stroke Mother   . Irritable bowel syndrome Mother   . Sexual abuse Mother   . Diabetes Brother   . Healthy Brother   . Diabetes Brother   . Heart disease Brother   . Anxiety disorder Paternal Uncle   . Alcohol abuse Paternal Uncle   . Alcohol abuse Cousin   . Anxiety disorder Maternal Uncle   . ADD / ADHD Neg Hx   . Bipolar disorder Neg Hx   . Dementia Neg Hx   . Drug abuse Neg Hx   . Paranoid behavior Neg Hx   . Schizophrenia Neg Hx   . Seizures Neg Hx   . Physical abuse Neg Hx      Prior to Admission medications   Medication Sig Start Date End Date Taking? Authorizing Provider  albuterol (PROAIR HFA) 108 (90 BASE) MCG/ACT inhaler Inhale 2 puffs into the lungs every 4 (four) hours as needed for wheezing or shortness of breath. 03/21/14  Yes Lindell Spar I, NP  azithromycin  (ZITHROMAX) 250 MG tablet Take 250-500 mg by mouth See admin instructions. Take 500mg  on day 1 then take 250mg  on days 2 through 5 starting on 09/12/2019 09/12/19  Yes [provider]  diclofenac sodium (VOLTAREN) 1 % GEL Apply 2 g topically 4 (four) times daily as needed (For pain.).  07/09/15  Yes [provider]  gabapentin (NEURONTIN) 100 MG capsule Take 200-300 mg by mouth See admin instructions. Take 200mg  by mouth every day in the morning and take 300mg  at bedtime 08/19/19  Yes [provider]  HYDROcodone-homatropine (HYCODAN) 5-1.5 MG/5ML syrup Take 5 mLs by mouth 4 (four) times daily as needed for cough.  09/12/19  Yes [provider]  lamoTRIgine (LAMICTAL) 100 MG tablet  Take one half in the am and one in the evening 07/18/19  Yes Cloria Spring, MD  lisinopril (ZESTRIL) 5 MG tablet Take 5 mg by mouth at bedtime. 09/17/19  Yes [provider]  LORazepam (ATIVAN) 1 MG tablet TAKE 1 TABLET BY MOUTH 3 TIMES DAILY AS NEEDED FOR ANXIETY. 07/18/19  Yes Cloria Spring, MD  pantoprazole (PROTONIX) 40 MG tablet Take 40 mg by mouth 2 (two) times daily. 09/17/19  Yes [provider]  pravastatin (PRAVACHOL) 80 MG tablet Take 80 mg at bedtime by mouth.    Yes [provider]  venlafaxine XR (EFFEXOR XR) 75 MG 24 hr capsule Take 3 capsules (225 mg total) by mouth daily. 07/18/19 07/17/20 Yes Cloria Spring, MD  Biotin w/ Vitamins C & E (HAIR/SKIN/NAILS PO) Take 2 tablets by mouth daily.    [provider]  budesonide-formoterol (SYMBICORT) 160-4.5 MCG/ACT inhaler Inhale 2 puffs into the lungs 2 (two) times daily.    [provider]  calcium-vitamin D (OSCAL WITH D) 500-200 MG-UNIT per tablet Take 1 tablet by mouth 2 (two) times daily. For low calcium 03/21/14   Lindell Spar I, NP  Coenzyme Q10 (COQ10) 100 MG CAPS Take 1 capsule daily by mouth.    [provider]  Cyanocobalamin (B-12) 2000 MCG TABS Take 2,000 mcg by  mouth daily.    [provider]  dicyclomine (BENTYL) 20 MG tablet Take 1 tablet (20 mg total) by mouth every 6 (six) hours as needed for spasms. 07/06/16   Setzer, Rona Ravens, NP  diphenoxylate-atropine (LOMOTIL) 2.5-0.025 MG tablet TAKE 1 TABLET BY MOUTH 3 TIMES A DAY AS NEEDED 08/22/18   Setzer, Rona Ravens, NP  Krill Oil 350 MG CAPS Take 350 mg by mouth daily.    [provider]  Misc Natural Products (OSTEO BI-FLEX JOINT SHIELD PO) Take 1 tablet by mouth daily.    [provider]  Multiple Vitamins-Minerals (MULTIVITAMIN WITH MINERALS) tablet Take 1 tablet daily by mouth.    [provider]  ranitidine (ZANTAC) 150 MG tablet TAKE 1 TABLET BY MOUTH TWICE A DAY 11/26/18   Rehman, Mechele Dawley, MD  tiZANidine (ZANAFLEX) 4 MG tablet Take 1 tablet (4 mg total) by mouth every 6 (six) hours as needed for muscle spasms. 04/18/18   Edmisten, Kristie L, PA  Turmeric 450 MG CAPS Take 450 mg by mouth daily.    [provider]  vitamin E 400 UNIT capsule Take 400 Units daily by mouth.    [provider]    Physical Exam: Vitals:   09/24/19 2310 09/24/19 2315 09/24/19 2316 09/24/19 2330  BP: 118/67 122/60  121/71  Pulse:      Resp: 17 19  17   Temp:   (!) 95.9 F (35.5 C)   TempSrc:   Rectal   SpO2:      Weight:      Height:         Constitutional: NAD, calm  Eyes: PERTLA, lids and conjunctivae normal ENMT: Mucous membranes are moist. Posterior pharynx clear of any exudate or lesions.   Neck: normal, supple, no masses, no thyromegaly Respiratory:  no wheezing, no crackles. No accessory muscle use.  Cardiovascular: S1 & S2 heard, regular rate and rhythm. No extremity edema. No significant JVD. Abdomen: No distension, soft, tender in lower abdomen without rebound pain or guarding. Bowel sounds active.  Musculoskeletal: no clubbing / cyanosis. No joint deformity upper and lower extremities.   Skin: no significant  rashes, lesions, ulcers. Warm, dry,  well-perfused. Neurologic: CN 2-12 grossly intact. Sensation intact. Strength 5/5 in all 4 limbs.  Psychiatric: Alert and oriented x 3. Pleasant and cooperative.    Labs and Imaging on Admission: I have personally reviewed following labs and imaging studies  CBC: Recent Labs  Lab 09/24/19 2027  WBC 10.3  NEUTROABS 6.6  HGB 14.0  HCT 44.8  MCV 90.9  PLT 123456   Basic Metabolic Panel: Recent Labs  Lab 09/24/19 2137  NA 135  K 4.3  CL 104  CO2 22  GLUCOSE 135*  BUN 23  CREATININE 1.04*  CALCIUM 8.4*  MG 2.2   GFR: Estimated Creatinine Clearance: 47.8 mL/min (A) (by C-G formula based on SCr of 1.04 mg/dL (H)). Liver Function Tests: Recent Labs  Lab 09/24/19 2137  AST 27  ALT 21  ALKPHOS 54  BILITOT 0.5  PROT 7.2  ALBUMIN 4.1   No results for input(s): LIPASE, AMYLASE in the last 168 hours. No results for input(s): AMMONIA in the last 168 hours. Coagulation Profile: Recent Labs  Lab 09/24/19 2137  INR 1.0   Cardiac Enzymes: No results for input(s): CKTOTAL, CKMB, CKMBINDEX, TROPONINI in the last 168 hours. BNP (last 3 results) No results for input(s): PROBNP in the last 8760 hours. HbA1C: No results for input(s): HGBA1C in the last 72 hours. CBG: No results for input(s): GLUCAP in the last 168 hours. Lipid Profile: No results for input(s): CHOL, HDL, LDLCALC, TRIG, CHOLHDL, LDLDIRECT in the last 72 hours. Thyroid Function Tests: No results for input(s): TSH, T4TOTAL, FREET4, T3FREE, THYROIDAB in the last 72 hours. Anemia Panel: No results for input(s): VITAMINB12, FOLATE, FERRITIN, TIBC, IRON, RETICCTPCT in the last 72 hours. Urine analysis:    Component Value Date/Time   COLORURINE YELLOW 04/09/2018 [place] 04/09/2018 1352   LABSPEC 1.006 04/09/2018 1352   PHURINE 7.0 04/09/2018 1352   GLUCOSEU NEGATIVE 04/09/2018 1352   HGBUR NEGATIVE 04/09/2018 1352   BILIRUBINUR NEGATIVE 04/09/2018 1352   KETONESUR NEGATIVE 04/09/2018 1352    PROTEINUR NEGATIVE 04/09/2018 1352   UROBILINOGEN 0.2 03/17/2014 1431   NITRITE NEGATIVE 04/09/2018 1352   LEUKOCYTESUR NEGATIVE 04/09/2018 1352   Sepsis Labs: @LABRCNTIP (procalcitonin:4,lacticidven:4) ) Recent Results (from the past 240 hour(s))  Respiratory Panel by RT PCR (Flu A&B, Covid) - Nasopharyngeal Swab     Status: None   Collection Time: 09/24/19  8:56 PM   Specimen: Nasopharyngeal Swab  Result Value Ref Range Status   SARS Coronavirus 2 by RT PCR NEGATIVE NEGATIVE Final    Comment: (NOTE) SARS-CoV-2 target nucleic acids are NOT DETECTED. The SARS-CoV-2 RNA is generally detectable in upper respiratoy specimens during the acute phase of infection. The lowest concentration of SARS-CoV-2 viral copies this assay can detect is 131 copies/mL. A negative result does not preclude SARS-Cov-2 infection and should not be used as the sole basis for treatment or other patient management decisions. A negative result may occur with  improper specimen collection/handling, submission of specimen other than nasopharyngeal swab, presence of viral mutation(s) within the areas targeted by this assay, and inadequate number of viral copies (<131 copies/mL). A negative result must be combined with clinical observations, patient history, and epidemiological information. The expected result is Negative. Fact Sheet for Patients:  PinkCheek.be Fact Sheet for Healthcare Providers:  GravelBags.it This test is not yet ap proved or cleared by the [place] FDA and  has been authorized for detection and/or diagnosis of SARS-CoV-2 by FDA under an  Emergency Use Authorization (EUA). This EUA will remain  in effect (meaning this test can be used) for the duration of the COVID-19 declaration under Section 564(b)(1) of the Act, 21 U.S.C. section 360bbb-3(b)(1), unless the authorization is terminated or revoked sooner.    Influenza A by PCR  NEGATIVE NEGATIVE Final   Influenza B by PCR NEGATIVE NEGATIVE Final    Comment: (NOTE) The Xpert Xpress SARS-CoV-2/FLU/RSV assay is intended as an aid in  the diagnosis of influenza from Nasopharyngeal swab specimens and  should not be used as a sole basis for treatment. Nasal washings and  aspirates are unacceptable for Xpert Xpress SARS-CoV-2/FLU/RSV  testing. Fact Sheet for Patients: PinkCheek.be Fact Sheet for Healthcare Providers: GravelBags.it This test is not yet approved or cleared by the [place] FDA and  has been authorized for detection and/or diagnosis of SARS-CoV-2 by  FDA under an Emergency Use Authorization (EUA). This EUA will remain  in effect (meaning this test can be used) for the duration of the  Covid-19 declaration under Section 564(b)(1) of the Act, 21  U.S.C. section 360bbb-3(b)(1), unless the authorization is  terminated or revoked. Performed at [facility], [address] [place], [place] [address]      Radiological Exams on Admission: CT Angio Chest/Abd/Pel for Dissection W and/or Wo Contrast  Result Date: 09/24/2019 CLINICAL DATA:  Syncope EXAM: CT ANGIOGRAPHY CHEST, ABDOMEN AND PELVIS TECHNIQUE: Multidetector CT imaging through the chest, abdomen and pelvis was performed using the standard protocol during bolus administration of intravenous contrast. Multiplanar reconstructed images and MIPs were obtained and reviewed to evaluate the vascular anatomy. CONTRAST:  122mL OMNIPAQUE IOHEXOL 350 MG/ML SOLN COMPARISON:  None. FINDINGS: CTA CHEST FINDINGS Cardiovascular: --Heart: The heart size is normal. There is nopericardial effusion. --Aorta: The course and caliber of the thoracic aorta are normal. There is mild aortic atherosclerotic calcification. Precontrast images show no aortic intramural hematoma. There is no blood pool, dissection or penetrating ulcer demonstrated on arterial phase postcontrast  imaging. There is a conventional 3 vessel aortic arch branching pattern. The proximal arch vessels are widely patent. --Pulmonary Arteries: Contrast timing is optimized for preferential opacification of the aorta. Within that limitation, normal central pulmonary arteries. Mediastinum/Nodes: Hiatal hernia. No mediastinal adenopathy. Lungs/Pleura: Bibasilar atelectasis. Musculoskeletal: No chest wall abnormality. No acute osseous findings. Review of the MIP images confirms the above findings. CTA ABDOMEN AND PELVIS FINDINGS VASCULAR Aorta: Normal caliber aorta without aneurysm, dissection, vasculitis or hemodynamically significant stenosis. There is moderate aortic atherosclerosis. Celiac: No aneurysm, dissection or hemodynamically significant stenosis. SMA: Widely patent without dissection or stenosis. Renals: Single right and paired left renal arteries. No aneurysm, dissection, stenosis or evidence of fibromuscular dysplasia. IMA: Patent without abnormality. Inflow: No aneurysm, stenosis or dissection. Veins: Normal course and caliber of the major veins. Assessment is otherwise limited by the arterial dominant contrast phase. Review of the MIP images confirms the above findings. NON-VASCULAR Hepatobiliary: Normal hepatic contours and density. No visible biliary dilatation. Status post cholecystectomy. Pancreas: Normal contours without ductal dilatation. No peripancreatic fluid collection. Spleen: Normal arterial phase splenic enhancement pattern. Adrenals/Urinary Tract: --Adrenal glands: Normal. --Right kidney/ureter: No hydronephrosis or perinephric stranding. No nephrolithiasis. No obstructing ureteral stones. --Left kidney/ureter: No hydronephrosis or perinephric stranding. No nephrolithiasis. No obstructing ureteral stones. --Urinary bladder: Unremarkable. Stomach/Bowel: --Stomach/Duodenum: Small hiatal hernia --Small bowel: No dilatation or inflammation. --Colon: Large stool ball in the rectum. The proximal  colon is predominantly fluid-filled. --Appendix: Normal. Lymphatic: No abdominal or pelvic lymphadenopathy. Reproductive: No free fluid  in the pelvis. Musculoskeletal. No bony spinal canal stenosis or focal osseous abnormality. Other: None. Review of the MIP images confirms the above findings. IMPRESSION: 1. No acute aortic syndrome or other acute abnormality of the chest, abdomen or pelvis. 2. Large stool ball in the rectum with predominantly fluid-filled proximal colon. 3.  Aortic atherosclerosis (ICD10-I70.0). Electronically Signed   By: Ulyses Jarred M.D.   On: 09/24/2019 21:15    EKG: Independently reviewed. Sinus rhythm, rate 73, QTc 453 ms.   Assessment/Plan   1. Shock  - Presents with hypotension, SBP 50's despite 500 cc IVF with EMS, had been straining to move bowels and had a syncopal episode while standing from toilet followed by N/V  - She remained hypotensive after another 4 liters crystaloid in ED and was started on Levophed infusion  - No fever or infectious s/s, no anaphylaxis, no chest pain, no bleeding, had not been vomiting or having much diarrhea prior to episode, and has no acute findings on CTA c/a/p in ED  - She was cultured in ED, given 4 liters NS, treated with vancomycin and Zosyn, and started on Levophed  - BP is improving, Levophed being titrated off, patient feeling much better, and this may have been a neurally mediated/vasovagal episode though she has never had before and was quite severe/prolonged  - Admit to ICU, titrate Levophed to off as tolerated, hold lisinopril, follow cultures, check cortisol, check echocardiogram    2. Acute kidney injury  - SCr is 1.04 in ED, up from baseline of ~0.6  - Likely acute prerenal azotemia in setting of shock, no hydronephrosis or obstructing stones on CT in ED  - She has been fluid-resuscitated in ED  - Hold lisinopril, repeat chem panel in am    3. Depression, anxiety, fibromyalgia   - Plan to continue home medications as  appropriate pending pharmacy medication-reconciliation    4. COPD  - No cough or wheezing  - Continue ICS/LABA and as-needed albuterol    DVT prophylaxis: Lovenox  Code Status: Full  Family Communication: Discussed with patient  Consults called: None  Admission status: Inpatient     Vianne Bulls, MD [facility] Pager: See [email or web address]  If 7AM-7PM, please contact the daytime attending [email or web address]  09/24/2019, 11:45 PM

## 2019-09-24 NOTE — ED Notes (Signed)
Pt transported to [place], accompanied with 2 RN.

## 2019-09-24 NOTE — ED Provider Notes (Signed)
[facility] EMERGENCY DEPARTMENT Provider Note   CSN: [id number] Arrival date & time: 09/24/19  1952     History Chief Complaint  Patient presents with  . Loss of Consciousness    Lauren [name] is a 69 y.o. female.  HPI   73yF with syncope. Happened in the bathroom when trying to have a bowel movement. Felt very weak after and unable to get up. Had to crawl to phone to call EMS.  On EMS arrival patient was hypotensive blood pressure 50/30.Marland Kitchen  Distress.  IV established.  She is given 500 cc normal saline without much improvement of her blood pressure.  She currently complains of lower abdominal pain and some pain in her lower back.  She states that her symptoms started pretty acutely when she was in the bathroom.  Felt relatively fine earlier in the day.  No fevers or chills.  Hypoxic although she denies specific respiratory complaints.  No sick contacts that she is aware of.  No urinary complaints.  Past Medical History:  Diagnosis Date  . Anxiety   . Chronic pain   . COPD (chronic obstructive pulmonary disease) ([place])   . Depression   . Fibromyalgia   . Fibromyalgia   . GERD (gastroesophageal reflux disease)   . Hepatitis C   . History of blood transfusion   . History of bronchitis   . IBS (irritable bowel syndrome)   . Osteoarthritis   . Pneumonia   . Prediabetes   . Shortness of breath dyspnea    allergies; increased pain;   . Vertigo   . Wears glasses     Patient Active Problem List   Diagnosis Date Noted  . Acute medial meniscal tear 10/21/2015  . GERD (gastroesophageal reflux disease) 01/21/2015  . Fibromyalgia 09/11/2014  . MDD (major depressive disorder) 03/19/2014  . Pain 07/25/2012  . Insomnia due to mental disorder 07/25/2012  . OA (osteoarthritis) of knee 02/14/2012  . Hepatitis C 02/14/2012  . IBS (irritable bowel syndrome) 02/14/2012  . Depression 12/27/2011    Past Surgical History:  Procedure Laterality Date  . CATARACT EXTRACTION W/PHACO  Right 08/01/2017   Procedure: CATARACT EXTRACTION PHACO AND INTRAOCULAR LENS PLACEMENT RIGHT EYE;  Surgeon: Rutherford Guys, MD;  Location: [facility];  Service: Ophthalmology;  Laterality: Right;  CDE: 3.30  . CATARACT EXTRACTION W/PHACO Left 08/15/2017   Procedure: CATARACT EXTRACTION PHACO AND INTRAOCULAR LENS PLACEMENT (IOC);  Surgeon: Rutherford Guys, MD;  Location: [facility];  Service: Ophthalmology;  Laterality: Left;  CDE: 4.00  . CHOLECYSTECTOMY    . COLONOSCOPY    . COLONOSCOPY N/A 10/23/2014   Procedure: COLONOSCOPY;  Surgeon: Rogene Houston, MD;  Location: [facility];  Service: Endoscopy;  Laterality: N/A;  1030  . ESOPHAGOGASTRODUODENOSCOPY N/A 10/23/2014   Procedure: ESOPHAGOGASTRODUODENOSCOPY (EGD);  Surgeon: Rogene Houston, MD;  Location: [facility];  Service: Endoscopy;  Laterality: N/A;  . KNEE ARTHROSCOPY Left 10/21/2015   Procedure: ARTHROSCOPY LEFT KNEE WITH MENICAL DEBRIDEMENT, Chondroplasty;  Surgeon: Gaynelle Arabian, MD;  Location: [facility];  Service: Orthopedics;  Laterality: Left;  Marland Kitchen MANDIBLE SURGERY  09/06/1979  . ORIF WRIST FRACTURE Right 07/23/2013   Procedure: OPEN REDUCTION INTERNAL FIXATION (ORIF) WRIST FRACTURE;  Surgeon: Roseanne Kaufman, MD;  Location: [facility];  Service: Orthopedics;  Laterality: Right;  . TONSILLECTOMY    . TOTAL KNEE ARTHROPLASTY     2011 rt knee  . TOTAL KNEE ARTHROPLASTY Left 04/16/2018   Procedure: LEFT TOTAL KNEE ARTHROPLASTY;  Surgeon: Wynelle Link,  Pilar Plate, MD;  Location: [facility];  Service: Orthopedics;  Laterality: Left;  . UPPER GASTROINTESTINAL ENDOSCOPY       OB History   No obstetric history on file.     Family History  Problem Relation Age of Onset  . Alcohol abuse Father   . Diabetes Father   . Stroke Father   . Depression Father   . Alcohol abuse Maternal Grandfather   . Alcohol abuse Maternal Grandmother   . Alcohol abuse Paternal Grandfather   . Alcohol abuse Paternal Grandmother   . Stroke Mother   . Irritable bowel syndrome Mother    . Sexual abuse Mother   . Diabetes Brother   . Healthy Brother   . Diabetes Brother   . Heart disease Brother   . Anxiety disorder Paternal Uncle   . Alcohol abuse Paternal Uncle   . Alcohol abuse Cousin   . Anxiety disorder Maternal Uncle   . ADD / ADHD Neg Hx   . Bipolar disorder Neg Hx   . Dementia Neg Hx   . Drug abuse Neg Hx   . Paranoid behavior Neg Hx   . Schizophrenia Neg Hx   . Seizures Neg Hx   . Physical abuse Neg Hx     Social History   Tobacco Use  . Smoking status: Former Smoker    Packs/day: 2.00    Years: 15.00    Pack years: 30.00    Types: Cigarettes    Quit date: 09/05/1982    Years since quitting: 37.0  . Smokeless tobacco: Never Used  Substance Use Topics  . Alcohol use: No    Alcohol/week: 0.0 standard drinks    Comment: history of alcoholism; pt states has been sober 28 years  . Drug use: No    Home Medications Prior to Admission medications   Medication Sig Start Date End Date Taking? Authorizing Provider  albuterol (PROAIR HFA) 108 (90 BASE) MCG/ACT inhaler Inhale 2 puffs into the lungs every 4 (four) hours as needed for wheezing or shortness of breath. 03/21/14   Lindell Spar I, NP  Biotin w/ Vitamins C & E (HAIR/SKIN/NAILS PO) Take 2 tablets by mouth daily.    [provider]  budesonide-formoterol (SYMBICORT) 160-4.5 MCG/ACT inhaler Inhale 2 puffs into the lungs 2 (two) times daily.    [provider]  calcium-vitamin D (OSCAL WITH D) 500-200 MG-UNIT per tablet Take 1 tablet by mouth 2 (two) times daily. For low calcium 03/21/14   Lindell Spar I, NP  Coenzyme Q10 (COQ10) 100 MG CAPS Take 1 capsule daily by mouth.    [provider]  Cyanocobalamin (B-12) 2000 MCG TABS Take 2,000 mcg by mouth daily.    [provider]  diclofenac sodium (VOLTAREN) 1 % GEL Apply 2 g topically 4 (four) times daily as needed (For pain.).  07/09/15   [provider]  dicyclomine (BENTYL) 20 MG tablet Take 1 tablet (20 mg  total) by mouth every 6 (six) hours as needed for spasms. 07/06/16   Setzer, Rona Ravens, NP  diphenoxylate-atropine (LOMOTIL) 2.5-0.025 MG tablet TAKE 1 TABLET BY MOUTH 3 TIMES A DAY AS NEEDED 08/22/18   Setzer, Rona Ravens, NP  gabapentin (NEURONTIN) 300 MG capsule Take 1 capsule (300 mg total) by mouth 3 (three) times daily. Gabapentin 300 mg Protocol Take a 300 mg capsule three times a day for two weeks following surgery. Then take a 300 mg capsule two times a day for two weeks.  Then take a  300 mg capsule once a day for two weeks.  Then resume Gabapentin as you were previously taking. 04/18/18   Edmisten, Ok Anis, PA  Krill Oil 350 MG CAPS Take 350 mg by mouth daily.    [provider]  lamoTRIgine (LAMICTAL) 100 MG tablet Take one half in the am and one in the evening 07/18/19   Cloria Spring, MD  LORazepam (ATIVAN) 1 MG tablet TAKE 1 TABLET BY MOUTH 3 TIMES DAILY AS NEEDED FOR ANXIETY. 07/18/19   Cloria Spring, MD  Misc Natural Products (OSTEO BI-FLEX JOINT SHIELD PO) Take 1 tablet by mouth daily.    [provider]  Multiple Vitamins-Minerals (MULTIVITAMIN WITH MINERALS) tablet Take 1 tablet daily by mouth.    [provider]  pravastatin (PRAVACHOL) 80 MG tablet Take 80 mg at bedtime by mouth.     [provider]  ranitidine (ZANTAC) 150 MG tablet TAKE 1 TABLET BY MOUTH TWICE A DAY 11/26/18   Rehman, Mechele Dawley, MD  tiZANidine (ZANAFLEX) 4 MG tablet Take 1 tablet (4 mg total) by mouth every 6 (six) hours as needed for muscle spasms. 04/18/18   Edmisten, Kristie L, PA  Turmeric 450 MG CAPS Take 450 mg by mouth daily.    [provider]  venlafaxine XR (EFFEXOR XR) 75 MG 24 hr capsule Take 3 capsules (225 mg total) by mouth daily. 07/18/19 07/17/20  Cloria Spring, MD  vitamin E 400 UNIT capsule Take 400 Units daily by mouth.    [provider]    Allergies    Elavil [amitriptyline], Flagyl [metronidazole], Vistaril [hydroxyzine hcl], Geodon  [ziprasidone hydrochloride], Lactose intolerance (gi), and Latex  Review of Systems   Review of Systems All systems reviewed and negative, other than as noted in HPI.  Physical Exam Updated Vital Signs BP (!) 99/52   Pulse 76   Resp 14   Ht 5\' 6"  (1.676 m)   Wt 69.4 kg   SpO2 100%   BMI 24.69 kg/m   Physical Exam Vitals and nursing note reviewed.  Constitutional:      General: She is in acute distress.     Appearance: She is well-developed. She is toxic-appearing.     Comments: Laying in bed. Moaning. Will answer questions and follow commands.   HENT:     Head: Normocephalic and atraumatic.  Eyes:     General:        Right eye: No discharge.        Left eye: No discharge.     Conjunctiva/sclera: Conjunctivae normal.  Cardiovascular:     Rate and Rhythm: Normal rate and regular rhythm.     Heart sounds: Normal heart sounds. No murmur. No friction rub. No gallop.   Pulmonary:     Effort: Pulmonary effort is normal. No respiratory distress.     Breath sounds: Normal breath sounds.  Abdominal:     General: There is distension.     Palpations: Abdomen is soft. There is no mass.     Tenderness: There is abdominal tenderness.     Hernia: No hernia is present.  Musculoskeletal:        General: No tenderness.     Cervical back: Neck supple.  Skin:    General: Skin is warm and dry.  Neurological:     Mental Status: She is alert.  Psychiatric:        Behavior: Behavior normal.        Thought Content: Thought content normal.  ED Results / Procedures / Treatments   Labs (all labs ordered are listed, but only abnormal results are displayed) Labs Reviewed  COMPREHENSIVE METABOLIC PANEL - Abnormal; Notable for the following components:      Result Value   Glucose, Bld 135 (*)    Creatinine, Ser 1.04 (*)    Calcium 8.4 (*)    GFR calc non Af Amer 55 (*)    All other components within normal limits  LACTIC ACID, PLASMA - Abnormal; Notable for the following  components:   Lactic Acid, Venous 2.5 (*)    All other components within normal limits  COMPREHENSIVE METABOLIC PANEL - Abnormal; Notable for the following components:   Glucose, Bld 180 (*)    Calcium 8.5 (*)    All other components within normal limits  CBC - Abnormal; Notable for the following components:   WBC 18.6 (*)    All other components within normal limits  CULTURE, BLOOD (ROUTINE X 2)  CULTURE, BLOOD (ROUTINE X 2)  RESPIRATORY PANEL BY RT PCR (FLU A&B, COVID)  MRSA PCR SCREENING  CBC WITH DIFFERENTIAL/PLATELET  MAGNESIUM  LACTIC ACID, PLASMA  PROTIME-INR  CORTISOL  HIV ANTIBODY (ROUTINE TESTING W REFLEX)  URINALYSIS, ROUTINE W REFLEX MICROSCOPIC  POC SARS CORONAVIRUS 2 AG -  ED  TYPE AND SCREEN  TROPONIN I (HIGH SENSITIVITY)  TROPONIN I (HIGH SENSITIVITY)    EKG EKG Interpretation  Date/Time:  Tuesday September 24 2019 20:06:55 EST Ventricular Rate:  74 PR Interval:    QRS Duration: 107 QT Interval:  408 QTC Calculation: 453 R Axis:   86 Text Interpretation: Sinus rhythm Borderline right axis deviation Abnormal R-wave progression, early transition When compared to prior, no significant cahnges seen, No STEMI Confirmed by Antony Blackbird [phone]) on 09/25/2019 11:04:28 AM   Radiology CT Angio Chest/Abd/Pel for Dissection W and/or Wo Contrast  Result Date: 09/24/2019 CLINICAL DATA:  Syncope EXAM: CT ANGIOGRAPHY CHEST, ABDOMEN AND PELVIS TECHNIQUE: Multidetector CT imaging through the chest, abdomen and pelvis was performed using the standard protocol during bolus administration of intravenous contrast. Multiplanar reconstructed images and MIPs were obtained and reviewed to evaluate the vascular anatomy. CONTRAST:  110mL OMNIPAQUE IOHEXOL 350 MG/ML SOLN COMPARISON:  None. FINDINGS: CTA CHEST FINDINGS Cardiovascular: --Heart: The heart size is normal. There is nopericardial effusion. --Aorta: The course and caliber of the thoracic aorta are normal. There is mild aortic  atherosclerotic calcification. Precontrast images show no aortic intramural hematoma. There is no blood pool, dissection or penetrating ulcer demonstrated on arterial phase postcontrast imaging. There is a conventional 3 vessel aortic arch branching pattern. The proximal arch vessels are widely patent. --Pulmonary Arteries: Contrast timing is optimized for preferential opacification of the aorta. Within that limitation, normal central pulmonary arteries. Mediastinum/Nodes: Hiatal hernia. No mediastinal adenopathy. Lungs/Pleura: Bibasilar atelectasis. Musculoskeletal: No chest wall abnormality. No acute osseous findings. Review of the MIP images confirms the above findings. CTA ABDOMEN AND PELVIS FINDINGS VASCULAR Aorta: Normal caliber aorta without aneurysm, dissection, vasculitis or hemodynamically significant stenosis. There is moderate aortic atherosclerosis. Celiac: No aneurysm, dissection or hemodynamically significant stenosis. SMA: Widely patent without dissection or stenosis. Renals: Single right and paired left renal arteries. No aneurysm, dissection, stenosis or evidence of fibromuscular dysplasia. IMA: Patent without abnormality. Inflow: No aneurysm, stenosis or dissection. Veins: Normal course and caliber of the major veins. Assessment is otherwise limited by the arterial dominant contrast phase. Review of the MIP images confirms the above findings. NON-VASCULAR Hepatobiliary: Normal hepatic contours and density. No  visible biliary dilatation. Status post cholecystectomy. Pancreas: Normal contours without ductal dilatation. No peripancreatic fluid collection. Spleen: Normal arterial phase splenic enhancement pattern. Adrenals/Urinary Tract: --Adrenal glands: Normal. --Right kidney/ureter: No hydronephrosis or perinephric stranding. No nephrolithiasis. No obstructing ureteral stones. --Left kidney/ureter: No hydronephrosis or perinephric stranding. No nephrolithiasis. No obstructing ureteral stones.  --Urinary bladder: Unremarkable. Stomach/Bowel: --Stomach/Duodenum: Small hiatal hernia --Small bowel: No dilatation or inflammation. --Colon: Large stool ball in the rectum. The proximal colon is predominantly fluid-filled. --Appendix: Normal. Lymphatic: No abdominal or pelvic lymphadenopathy. Reproductive: No free fluid in the pelvis. Musculoskeletal. No bony spinal canal stenosis or focal osseous abnormality. Other: None. Review of the MIP images confirms the above findings. IMPRESSION: 1. No acute aortic syndrome or other acute abnormality of the chest, abdomen or pelvis. 2. Large stool ball in the rectum with predominantly fluid-filled proximal colon. 3.  Aortic atherosclerosis (ICD10-I70.0). Electronically Signed   By: Ulyses Jarred M.D.   On: 09/24/2019 21:15    Procedures .Central Line  Date/Time: 09/24/2019 8:30 PM Performed by: Virgel Manifold, MD Authorized by: Virgel Manifold, MD   Consent:    Consent obtained:  Emergent situation and verbal   Consent given by:  Patient   Risks discussed:  Bleeding, nerve damage, infection and arterial puncture Pre-procedure details:    Hand hygiene: Hand hygiene performed prior to insertion     Skin preparation:  2% chlorhexidine   Skin preparation agent: Skin preparation agent completely dried prior to procedure   Anesthesia (see MAR for exact dosages):    Anesthesia method:  Local infiltration   Local anesthetic:  Lidocaine 1% w/o epi Procedure details:    Location:  R femoral   Patient position:  Flat   Procedural supplies:  Triple lumen   Catheter size:  7.5 Fr   Landmarks identified: yes     Ultrasound guidance: yes     Sterile ultrasound techniques: Sterile gel and sterile probe covers were used     Number of attempts:  1   Successful placement: yes   Post-procedure details:    Post-procedure:  Dressing applied and line sutured   Assessment:  Blood return through all ports and free fluid flow   Patient tolerance of procedure:   Tolerated well, no immediate complications   CRITICAL CARE Performed by: Virgel Manifold Total critical care time: 35 minutes Critical care time was exclusive of separately billable procedures and treating other patients. Critical care was necessary to treat or prevent imminent or life-threatening deterioration. Critical care was time spent personally by me on the following activities: development of treatment plan with patient and/or surrogate as well as nursing, discussions with consultants, evaluation of patient's response to treatment, examination of patient, obtaining history from patient or surrogate, ordering and performing treatments and interventions, ordering and review of laboratory studies, ordering and review of radiographic studies, pulse oximetry and re-evaluation of patient's condition.   (including critical care time)  Medications Ordered in ED Medications  norepinephrine (LEVOPHED) 4mg  in 242mL premix infusion (2 mcg/min Intravenous New Bag/Given 09/24/19 2025)  sodium chloride 0.9 % bolus 1,000 mL (1,000 mLs Intravenous New Bag/Given 09/24/19 2023)    Followed by  sodium chloride 0.9 % bolus 1,000 mL (1,000 mLs Intravenous New Bag/Given 09/24/19 2029)    Followed by  sodium chloride 0.9 % bolus 1,000 mL (1,000 mLs Intravenous New Bag/Given 09/24/19 2022)  sodium chloride 0.9 % bolus 1,000 mL (1,000 mLs Intravenous New Bag/Given 09/24/19 2030)  iohexol (OMNIPAQUE) 350 MG/ML injection 100 mL (has  no administration in time range)  piperacillin-tazobactam (ZOSYN) IVPB 3.375 g (has no administration in time range)  vancomycin (VANCOCIN) IVPB 1000 mg/200 mL premix (has no administration in time range)  ondansetron (ZOFRAN) injection 4 mg (4 mg Intravenous Given 09/24/19 2020)  fentaNYL (SUBLIMAZE) injection 50 mcg (50 mcg Intravenous Given 09/24/19 2034)  fentaNYL (SUBLIMAZE) injection 50 mcg (50 mcg Intravenous Given 09/24/19 2042)    ED Course  I have reviewed the triage vital  signs and the nursing notes.  Pertinent labs & imaging results that were available during my care of the patient were reviewed by me and considered in my medical decision making (see chart for details).    MDM Rules/Calculators/A&P                      68yF with abdominal pain and n/v. Acute onset of symptoms this evening. Arrived to ED with BP 50/30.This was after 500cc of NS by EMS. Hypoxic in 80s on 4L [place].  Increased to 6L and then to NRB. Diffuse abdominal tenderness. Mild distension? Mottled appearance with cool extremities. Second IV established and 2L NS bolused. I attempted bedside US over concern for possible AAA. I couldn't get diagnostic views. No free fluid noted in morrison's pouch or in pelvis though. Remained very hypotensive. Additional 2L IVF bolused. Levophed started. R femoral central placed. EKG with no overt ichemic changes. Able to get >100 SBP and sent emergently for CTa.   Imaging and reassuring thus far. I'm really not sure of the etiology of her marked and persistent hypotension. Vagal? Duration of symptoms seems long for this though. Anaphylaxis? No wheezing noted. No rash. Empiric abx were ordered although this is not clearly an infectious process. She is feeling better. BP is better. Will admit her for overnight observation at this point.   Final Clinical Impression(s) / ED Diagnoses Final diagnoses:  Hypotension, unspecified hypotension type  Lower abdominal pain    Rx / DC Orders ED Discharge Orders    None       Virgel Manifold, MD 09/25/19 [id number]

## 2019-09-24 NOTE — ED Triage Notes (Signed)
cbg 165; pt brought in by [facility] for c/o syncopal episode; pt states she was sitting on the toilet trying to have a BM and when she stood up she passed out; ems reports BP of 53/36 and was given 500 cc of normal saline with no increase of BP; pt c/o abdominal pain with vomiting

## 2019-09-24 NOTE — ED Notes (Signed)
Dr. Kohut at bedside 

## 2019-09-24 NOTE — ED Notes (Signed)
CT called and informed of order and we would bring pt as soon as central line was placed.

## 2019-09-24 NOTE — ED Notes (Signed)
Dr Wilson Singer remains at bedside.

## 2019-09-25 ENCOUNTER — Inpatient Hospital Stay (HOSPITAL_COMMUNITY): Payer: Medicare Other

## 2019-09-25 DIAGNOSIS — R55 Syncope and collapse: Secondary | ICD-10-CM

## 2019-09-25 DIAGNOSIS — R579 Shock, unspecified: Secondary | ICD-10-CM | POA: Diagnosis not present

## 2019-09-25 DIAGNOSIS — J449 Chronic obstructive pulmonary disease, unspecified: Secondary | ICD-10-CM | POA: Diagnosis not present

## 2019-09-25 DIAGNOSIS — N179 Acute kidney failure, unspecified: Secondary | ICD-10-CM | POA: Diagnosis not present

## 2019-09-25 DIAGNOSIS — F419 Anxiety disorder, unspecified: Secondary | ICD-10-CM | POA: Diagnosis not present

## 2019-09-25 LAB — COMPREHENSIVE METABOLIC PANEL
ALT: 19 U/L (ref 0–44)
AST: 25 U/L (ref 15–41)
Albumin: 4 g/dL (ref 3.5–5.0)
Alkaline Phosphatase: 50 U/L (ref 38–126)
Anion gap: 12 (ref 5–15)
BUN: 20 mg/dL (ref 8–23)
CO2: 24 mmol/L (ref 22–32)
Calcium: 8.5 mg/dL — ABNORMAL LOW (ref 8.9–10.3)
Chloride: 101 mmol/L (ref 98–111)
Creatinine, Ser: 0.83 mg/dL (ref 0.44–1.00)
GFR calc Af Amer: >60 mL/min (ref >60)
GFR calc non Af Amer: >60 mL/min (ref >60)
Glucose, Bld: 180 mg/dL — ABNORMAL HIGH (ref 70–99)
Potassium: 4.1 mmol/L (ref 3.5–5.1)
Sodium: 137 mmol/L (ref 135–145)
Total Bilirubin: 0.4 mg/dL (ref 0.3–1.2)
Total Protein: 7.2 g/dL (ref 6.5–8.1)

## 2019-09-25 LAB — CBC
HCT: 40.4 % (ref 36.0–46.0)
Hemoglobin: 12.6 g/dL (ref 12.0–15.0)
MCH: 28.3 pg (ref 26.0–34.0)
MCHC: 31.2 g/dL (ref 30.0–36.0)
MCV: 90.6 fL (ref 80.0–100.0)
Platelets: 212 10*3/uL (ref 150–400)
RBC: 4.46 MIL/uL (ref 3.87–5.11)
RDW: 12.6 % (ref 11.5–15.5)
WBC: 18.6 10*3/uL — ABNORMAL HIGH (ref 4.0–10.5)
nRBC: 0 % (ref 0.0–0.2)

## 2019-09-25 LAB — MRSA PCR SCREENING: MRSA by PCR: NEGATIVE

## 2019-09-25 LAB — ECHOCARDIOGRAM COMPLETE
Height: 66 in
Weight: 2462.1 oz

## 2019-09-25 LAB — TROPONIN I (HIGH SENSITIVITY): Troponin I (High Sensitivity): <2 ng/L (ref <18)

## 2019-09-25 LAB — HIV ANTIBODY (ROUTINE TESTING W REFLEX): HIV Screen 4th Generation wRfx: NONREACTIVE

## 2019-09-25 LAB — CORTISOL: Cortisol, Plasma: 49.9 ug/dL

## 2019-09-25 MED ORDER — SODIUM CHLORIDE 0.9 % IV SOLN: 250.0000 mL | INTRAVENOUS | Status: DC | PRN

## 2019-09-25 MED ORDER — SODIUM CHLORIDE 0.9% FLUSH
3.0000 mL | Freq: Two times a day (BID) | INTRAVENOUS | Status: DC
  Administered 2019-09-25: 3 mL via INTRAVENOUS

## 2019-09-25 MED ORDER — ALBUTEROL SULFATE HFA 108 (90 BASE) MCG/ACT IN AERS: 2.0000 | INHALATION_SPRAY | RESPIRATORY_TRACT | Status: DC | PRN

## 2019-09-25 MED ORDER — HYDROCODONE-HOMATROPINE 5-1.5 MG/5ML PO SYRP: 5.0000 mL | ORAL_SOLUTION | Freq: Four times a day (QID) | ORAL | Status: DC | PRN

## 2019-09-25 MED ORDER — MOMETASONE FURO-FORMOTEROL FUM 100-5 MCG/ACT IN AERO
2.0000 | INHALATION_SPRAY | Freq: Two times a day (BID) | RESPIRATORY_TRACT | Status: DC
  Administered 2019-09-25: 2 via RESPIRATORY_TRACT
  Filled 2019-09-25: qty 8.8

## 2019-09-25 MED ORDER — SODIUM CHLORIDE 0.9% FLUSH: 3.0000 mL | INTRAVENOUS | Status: DC | PRN

## 2019-09-25 MED ORDER — PROCHLORPERAZINE MALEATE 10 MG PO TABS: 10.0000 mg | ORAL_TABLET | Freq: Four times a day (QID) | ORAL | 0 refills | Status: DC | PRN

## 2019-09-25 MED ORDER — ONDANSETRON HCL 4 MG PO TABS
4.0000 mg | ORAL_TABLET | Freq: Four times a day (QID) | ORAL | Status: DC | PRN
  Administered 2019-09-25: 4 mg via ORAL
  Filled 2019-09-25: qty 1

## 2019-09-25 MED ORDER — LOPERAMIDE HCL 2 MG PO CAPS
4.0000 mg | ORAL_CAPSULE | Freq: Once | ORAL | Status: AC
  Administered 2019-09-25: 4 mg via ORAL
  Filled 2019-09-25: qty 2

## 2019-09-25 MED ORDER — ONDANSETRON HCL 4 MG/2ML IJ SOLN
4.0000 mg | Freq: Four times a day (QID) | INTRAMUSCULAR | Status: DC | PRN
  Administered 2019-09-25: 4 mg via INTRAVENOUS
  Filled 2019-09-25: qty 2

## 2019-09-25 MED ORDER — CHLORHEXIDINE GLUCONATE CLOTH 2 % EX PADS
6.0000 | MEDICATED_PAD | Freq: Every day | CUTANEOUS | Status: DC
  Administered 2019-09-25: 6 via TOPICAL

## 2019-09-25 MED ORDER — ACETAMINOPHEN 650 MG RE SUPP: 650.0000 mg | Freq: Four times a day (QID) | RECTAL | Status: DC | PRN

## 2019-09-25 MED ORDER — ALBUTEROL SULFATE (2.5 MG/3ML) 0.083% IN NEBU: 2.5000 mg | INHALATION_SOLUTION | RESPIRATORY_TRACT | Status: DC | PRN

## 2019-09-25 MED ORDER — POLYETHYLENE GLYCOL 3350 17 G PO PACK: 17.0000 g | PACK | Freq: Every day | ORAL | Status: DC | PRN

## 2019-09-25 MED ORDER — ORAL CARE MOUTH RINSE
15.0000 mL | Freq: Two times a day (BID) | OROMUCOSAL | Status: DC
  Administered 2019-09-25: 15 mL via OROMUCOSAL

## 2019-09-25 MED ORDER — ACETAMINOPHEN 325 MG PO TABS
650.0000 mg | ORAL_TABLET | Freq: Four times a day (QID) | ORAL | Status: DC | PRN
  Administered 2019-09-25: 650 mg via ORAL
  Filled 2019-09-25: qty 2

## 2019-09-25 MED ORDER — ENOXAPARIN SODIUM 40 MG/0.4ML ~~LOC~~ SOLN
40.0000 mg | SUBCUTANEOUS | Status: DC
  Administered 2019-09-25: 40 mg via SUBCUTANEOUS
  Filled 2019-09-25: qty 0.4

## 2019-09-25 NOTE — ED Notes (Signed)
Pt removed from the bedpan and pt's gown changed and pt repositioned; pt verbalizes she is comfortable at this time

## 2019-09-25 NOTE — Progress Notes (Signed)
*  PRELIMINARY RESULTS* Echocardiogram 2D Echocardiogram has been performed.  Lauren [name] 09/25/2019, 8:49 AM

## 2019-09-25 NOTE — Progress Notes (Signed)
Being discharged to home. IV access removed without difficulty. Right femoral line removed with Vaseline gauze covering area and pressure tape. Instructed patient to lie still for 30 minutes to prevent bleeding. Gave all paperwork and will take out by wheelchair.

## 2019-09-25 NOTE — ED Notes (Signed)
Pt placed on bedpan

## 2019-09-25 NOTE — Care Management Obs Status (Signed)
[place] NOTIFICATION   Patient Details  Name: Lauren [name] MRN: [id number] Date of Birth: [birth year]   Medicare Observation Status Notification Given:  Yes    Shade Flood, LCSW 09/25/2019, 2:50 PM

## 2019-09-25 NOTE — Care Management CC44 (Signed)
Condition Code 44 Documentation Completed  Patient Details  Name: Lauren [name] MRN: [id number] Date of Birth: Aug 28, 1951   Condition Code 44 given:  Yes Patient signature on Condition Code 44 notice:  Yes Documentation of 2 MD's agreement:  Yes Code 44 added to claim:  Yes    Shade Flood, LCSW 09/25/2019, 2:50 PM

## 2019-09-25 NOTE — Discharge Summary (Signed)
Physician Discharge Summary  Lauren [name] [id number] DOB: 1950-09-11 DOA: 09/24/2019  PCP: Redmond School, MD  Admit date: 09/24/2019 Discharge date: 09/25/2019  Time spent: 35 minutes  Recommendations for Outpatient Follow-up:  1. Repeat basic metabolic panel to follow electrolytes and renal function 2. Reassess blood pressure and further adjust antihypertensive regimen as required.   Discharge Diagnoses:  Principal Problem:   Shock ([place]) Active Problems:   Depression   IBS (irritable bowel syndrome)   Fibromyalgia   Anxiety   COPD (chronic obstructive pulmonary disease) ([place])   AKI (acute kidney injury) ([place])   Discharge Condition: Stable and improved.  Patient discharged home with instruction to follow-up with PCP in 10 days.  Diet recommendation: Heart healthy diet.  Filed Weights   09/24/19 2010 09/25/19 0500  Weight: 69.4 kg 69.8 kg    History of present illness:  69 y.o. female with medical history significant for depression, anxiety, fibromyalgia, COPD, IBS, and prediabetes on lisinopril for renal protection, now presenting to the emergency department after syncopal episode.  Patient reports that she frequently has loose stools and constipation attributed to her IBS, had been on the toilet straining to move her bowels, and then had a syncopal episode upon standing.  This was followed by nausea with an episode of nonbloody vomiting.  EMS was called and found the patient to have systolic blood pressure in the 50s.  She was given 500 cc of IV fluid while in route to the hospital, but with no improvement in BP.  Patient denies any recent fevers, chills, cough, dysuria, or flank pain.  She has not had any chest pain or palpitations.  She denies any headache or focal numbness or weakness.  She has never had similar episode previously.  She had already taken her lisinopril this evening.  She had been feeling constipated but has now had a bowel movement in the ED.  ED  Course: Upon arrival to the ED, patient is found to be saturating in the mid 80s on room air, normal respiratory rate and normal heart rate, and with systolic blood pressure in the 50s.  EKG features a sinus rhythm.  Chemistry panel notable for creatinine 1.04, up from 0.56 in 2019.  CBC is unremarkable.  Initial lactic acid 2.5.  CTA chest/abdomen/pelvis negative for acute findings.  Covid antigen and PCR tests are both negative.  Blood cultures were collected in the ED, 4 L normal saline administered, and the patient was treated with vancomycin, Zosyn, started on Levophed, and hospitalists consulted for admission.  Hospital Course:  1-vasovagal syncope -Neurally mediated shock on presentation -Systolic blood pressure in the 50s requiring fluid resuscitation and transient use of Levophed -Antihypertensive for discontinue -Patient's blood pressure eventually improved when he stabilizes after fluid resuscitation. -No abnormalities on echo -Is stable to resume home antihypertensive regimen -Patient advised to follow low-sodium diet -No signs of acute infection appreciated and at discharge no need for antibiotics.  2-acute kidney injury -Serum creatinine 1.04; Baseline 0.6 -Improving back to baseline after fluid resuscitation. -Safe to resume lisinopril.  3-depression, anxiety, fibromyalgia -Resume home medications -Mood overall stable -No suicidal ideation or hallucinations.  4-history of IBS -Chronically with intermittent episode of constipation and diarrhea -Continue the use of PPI, Bentyl and as needed Lomotil.  5-COPD -No cough, no wheezing, no shortness of breath -Continue home bronchodilators as previously prescribed. -Good oxygen saturation on room air.  6-hyperlipidemia -Continue statins  7-chronic diastolic heart failure -No signs of fluid overload appreciated  -Overall compensated -  Most likely associated with chronic history of high blood pressure -Advised to follow  daily weights, low-sodium diet and continue blood pressure management.  Procedures:  See below for x-ray reports  2D echo:  1. Left ventricular ejection fraction, by visual estimation, is 60 to 65%. The left ventricle has normal function. There is mildly increased left ventricular hypertrophy.  2. Left ventricular diastolic parameters are consistent with Grade I diastolic dysfunction (impaired relaxation).  3. The left ventricle has no regional wall motion abnormalities.  4. Global right ventricle has normal systolic function.The right ventricular size is normal. No increase in right ventricular wall thickness.  5. Left atrial size was mildly dilated.  6. Right atrial size was normal.  7. The mitral valve is grossly normal. No evidence of mitral valve regurgitation.  8. The tricuspid valve is grossly normal.  9. The tricuspid valve is grossly normal. Tricuspid valve regurgitation is not demonstrated. 10. The aortic valve is tricuspid. Aortic valve regurgitation is not visualized. 11. The pulmonic valve was grossly normal. Pulmonic valve regurgitation is not visualized. 12. The inferior vena cava is normal in size with greater than 50% respiratory variability, suggesting right atrial pressure of 3 mmHg.  FINDINGS  Left Ventricle: Left ventricular ejection fraction, by visual estimation, is 60 to 65%. The left ventricle has normal function. The left ventricle has no regional wall motion abnormalities. The left ventricular internal cavity size was the left  ventricle is normal in size. There is mildly increased left ventricular hypertrophy. Concentric left ventricular hypertrophy. Left ventricular diastolic parameters are consistent with Grade I diastolic dysfunction (impaired relaxation). Normal left  atrial pressure.  Consultations:  None  Discharge Exam: Vitals:   09/25/19 1430 09/25/19 1435  BP:    Pulse: 82 73  Resp: 16 17  Temp:    SpO2: 96% 97%    General: Afebrile, no  chest pain, no shortness of breath, no vomiting.  Good oxygen saturation on room air and otherwise feeling stable and in no acute distress.  Patient reports mild intermittent nausea (which is not new for her and associated with chronic history of IBS). Cardiovascular: S1 and S2, no rubs, no gallops, no JVD on exam. Respiratory: Clear to auscultation bilaterally.  No using accessory muscles; normal respiratory effort. Abdomen: Soft, nontender, positive bowel sounds Extremities: No cyanosis, no clubbing, no edema.  Discharge Instructions   Discharge Instructions    Diet - low sodium heart healthy   Complete by: As directed    Discharge instructions   Complete by: As directed    Please take medications as prescribed Maintain adequate hydration Arrange follow-up with PCP in 10 days Follow adequate nutrition and increase fiber intake in your diet.   Increase activity slowly   Complete by: As directed      Allergies as of 09/25/2019      Reactions   Elavil [amitriptyline] Other (See Comments)   Vivid dreams and almost suicidal   Flagyl [metronidazole] Itching   Vistaril [hydroxyzine Hcl] Other (See Comments)   Got higher than a kite on too much of this.   Geodon [ziprasidone Hydrochloride] Other (See Comments)   Blacked out   Lactose Intolerance (gi) Other (See Comments)   GI upset   Latex Other (See Comments)   Red at site      Medication List    STOP taking these medications   azithromycin 250 MG tablet Commonly known as: ZITHROMAX   Krill Oil 350 MG Caps   tiZANidine 4 MG tablet  Commonly known as: ZANAFLEX     TAKE these medications   albuterol 108 (90 Base) MCG/ACT inhaler Commonly known as: ProAir HFA Inhale 2 puffs into the lungs every 4 (four) hours as needed for wheezing or shortness of breath.   B-12 2000 MCG Tabs Take 2,000 mcg by mouth daily.   calcium-vitamin D 500-200 MG-UNIT tablet Commonly known as: OSCAL WITH D Take 1 tablet by mouth 2 (two) times  daily. For low calcium   CoQ10 100 MG Caps Take 1 capsule daily by mouth.   diclofenac sodium 1 % Gel Commonly known as: VOLTAREN Apply 2 g topically 4 (four) times daily as needed (For pain.).   dicyclomine 20 MG tablet Commonly known as: BENTYL Take 1 tablet (20 mg total) by mouth every 6 (six) hours as needed for spasms.   diphenoxylate-atropine 2.5-0.025 MG tablet Commonly known as: LOMOTIL TAKE 1 TABLET BY MOUTH 3 TIMES A DAY AS NEEDED   gabapentin 100 MG capsule Commonly known as: NEURONTIN Take 200-300 mg by mouth See admin instructions. Take 200mg  by mouth every day in the morning and take 300mg  at bedtime   HAIR/SKIN/NAILS PO Take 2 tablets by mouth daily.   HYDROcodone-homatropine 5-1.5 MG/5ML syrup Commonly known as: HYCODAN Take 5 mLs by mouth every 6 (six) hours as needed for cough. What changed: when to take this   lamoTRIgine 100 MG tablet Commonly known as: LaMICtal Take one half in the am and one in the evening   lisinopril 5 MG tablet Commonly known as: ZESTRIL Take 5 mg by mouth at bedtime.   LORazepam 1 MG tablet Commonly known as: ATIVAN TAKE 1 TABLET BY MOUTH 3 TIMES DAILY AS NEEDED FOR ANXIETY.   multivitamin with minerals tablet Take 1 tablet daily by mouth.   OSTEO BI-FLEX JOINT SHIELD PO Take 1 tablet by mouth daily.   pantoprazole 40 MG tablet Commonly known as: PROTONIX Take 40 mg by mouth 2 (two) times daily.   pravastatin 80 MG tablet Commonly known as: PRAVACHOL Take 80 mg at bedtime by mouth.   prochlorperazine 10 MG tablet Commonly known as: COMPAZINE Take 1 tablet (10 mg total) by mouth every 6 (six) hours as needed for nausea or vomiting.   ranitidine 150 MG tablet Commonly known as: ZANTAC TAKE 1 TABLET BY MOUTH TWICE A DAY   Symbicort 160-4.5 MCG/ACT inhaler Generic drug: budesonide-formoterol Inhale 2 puffs into the lungs 2 (two) times daily.   Turmeric 450 MG Caps Take 450 mg by mouth daily.   venlafaxine XR  75 MG 24 hr capsule Commonly known as: Effexor XR Take 3 capsules (225 mg total) by mouth daily.   vitamin E 180 MG (400 UNITS) capsule Take 400 Units daily by mouth.      Allergies  Allergen Reactions  . Elavil [Amitriptyline] Other (See Comments)    Vivid dreams and almost suicidal  . Flagyl [Metronidazole] Itching  . Vistaril [Hydroxyzine Hcl] Other (See Comments)    Got higher than a kite on too much of this.  [place] [Ziprasidone Hydrochloride] Other (See Comments)    Blacked out  . Lactose Intolerance (Gi) Other (See Comments)    GI upset  . Latex Other (See Comments)    Red at site   Follow-up Information    Redmond School, MD. Schedule an appointment as soon as possible for a visit in 10 day(s).   Specialty: Internal Medicine Contact information: [address] [place] [id number] [phone]  Josue Hector, MD .   Specialty: Cardiology Contact information: [phone] [address] [place] [address] [phone]           The results of significant diagnostics from this hospitalization (including imaging, microbiology, ancillary and laboratory) are listed below for reference.    Significant Diagnostic Studies: ECHOCARDIOGRAM COMPLETE  Result Date: 09/25/2019   ECHOCARDIOGRAM REPORT   Patient Name:   MCKINSLEY GONCE Date of Exam: 09/25/2019 Medical Rec #:  [id number]        Height:       66.0 in Accession #:    [id number]       Weight:       153.9 lb Date of Birth:  08/16/1951         BSA:          1.79 m Patient Age:    27 years         BP:           125/64 mmHg Patient Gender: F                HR:           80 bpm. Exam Location:  [facility] Procedure: 2D Echo Indications:    Syncope 780.2 / R55  History:        Patient has prior history of Echocardiogram examinations, most                 recent 03/16/2018. COPD; Risk Factors:Former Smoker.                 Fibromyalgia, GERD , MDD , Hepatitis C, AKI.  Sonographer:    Leavy Cella RDCS (AE) Referring Phys: [id number] [place]  1. Left ventricular ejection fraction, by visual estimation, is 60 to 65%. The left ventricle has normal function. There is mildly increased left ventricular hypertrophy.  2. Left ventricular diastolic parameters are consistent with Grade I diastolic dysfunction (impaired relaxation).  3. The left ventricle has no regional wall motion abnormalities.  4. Global right ventricle has normal systolic function.The right ventricular size is normal. No increase in right ventricular wall thickness.  5. Left atrial size was mildly dilated.  6. Right atrial size was normal.  7. The mitral valve is grossly normal. No evidence of mitral valve regurgitation.  8. The tricuspid valve is grossly normal.  9. The tricuspid valve is grossly normal. Tricuspid valve regurgitation is not demonstrated. 10. The aortic valve is tricuspid. Aortic valve regurgitation is not visualized. 11. The pulmonic valve was grossly normal. Pulmonic valve regurgitation is not visualized. 12. The inferior vena cava is normal in size with greater than 50% respiratory variability, suggesting right atrial pressure of 3 mmHg. FINDINGS  Left Ventricle: Left ventricular ejection fraction, by visual estimation, is 60 to 65%. The left ventricle has normal function. The left ventricle has no regional wall motion abnormalities. The left ventricular internal cavity size was the left ventricle is normal in size. There is mildly increased left ventricular hypertrophy. Concentric left ventricular hypertrophy. Left ventricular diastolic parameters are consistent with Grade I diastolic dysfunction (impaired relaxation). Normal left atrial pressure. Right Ventricle: The right ventricular size is normal. No increase in right ventricular wall thickness. Global RV systolic function is has normal systolic function. Left Atrium: Left atrial size was mildly dilated. Right Atrium: Right atrial size was normal in  size Pericardium: There is no evidence of pericardial effusion. Mitral Valve: The mitral valve is  grossly normal. No evidence of mitral valve regurgitation. Tricuspid Valve: The tricuspid valve is grossly normal. Tricuspid valve regurgitation is not demonstrated. Aortic Valve: The aortic valve is tricuspid. Aortic valve regurgitation is not visualized. Pulmonic Valve: The pulmonic valve was grossly normal. Pulmonic valve regurgitation is not visualized. Pulmonic regurgitation is not visualized. Aorta: The aortic root is normal in size and structure. Venous: The inferior vena cava is normal in size with greater than 50% respiratory variability, suggesting right atrial pressure of 3 mmHg. IAS/Shunts: No atrial level shunt detected by color flow Doppler.  LEFT VENTRICLE PLAX 2D LVIDd:         4.31 cm  Diastology LVIDs:         3.24 cm  LV e' lateral:   10.80 cm/s LV PW:         1.18 cm  LV E/e' lateral: 5.1 LV IVS:        1.27 cm  LV e' medial:    6.64 cm/s LVOT diam:     2.00 cm  LV E/e' medial:  8.3 LV SV:         41 ml LV SV Index:   22.79 LVOT Area:     3.14 cm  RIGHT VENTRICLE RV S prime:     20.10 cm/s TAPSE (M-mode): 2.3 cm LEFT ATRIUM             Index       RIGHT ATRIUM           Index LA diam:        3.30 cm 1.84 cm/m  RA Area:     11.80 cm LA Vol (A2C):   46.7 ml 26.10 ml/m RA Volume:   23.40 ml  13.08 ml/m LA Vol (A4C):   65.7 ml 36.73 ml/m LA Biplane Vol: 61.0 ml 34.10 ml/m   AORTA Ao Root diam: 3.00 cm MITRAL VALVE MV Area (PHT): 3.31 cm             SHUNTS MV PHT:        66.41 msec           Systemic Diam: 2.00 cm MV Decel Time: 229 msec MV E velocity: 54.80 cm/s 103 cm/s MV A velocity: 58.20 cm/s 70.3 cm/s MV E/A ratio:  0.94       1.5  Kate Sable MD Electronically signed by Kate Sable MD Signature Date/Time: 09/25/2019/9:46:30 AM    Final    CT Angio Chest/Abd/Pel for Dissection W and/or Wo Contrast  Result Date: 09/24/2019 CLINICAL DATA:  Syncope EXAM: CT ANGIOGRAPHY CHEST,  ABDOMEN AND PELVIS TECHNIQUE: Multidetector CT imaging through the chest, abdomen and pelvis was performed using the standard protocol during bolus administration of intravenous contrast. Multiplanar reconstructed images and MIPs were obtained and reviewed to evaluate the vascular anatomy. CONTRAST:  141mL OMNIPAQUE IOHEXOL 350 MG/ML SOLN COMPARISON:  None. FINDINGS: CTA CHEST FINDINGS Cardiovascular: --Heart: The heart size is normal. There is nopericardial effusion. --Aorta: The course and caliber of the thoracic aorta are normal. There is mild aortic atherosclerotic calcification. Precontrast images show no aortic intramural hematoma. There is no blood pool, dissection or penetrating ulcer demonstrated on arterial phase postcontrast imaging. There is a conventional 3 vessel aortic arch branching pattern. The proximal arch vessels are widely patent. --Pulmonary Arteries: Contrast timing is optimized for preferential opacification of the aorta. Within that limitation, normal central pulmonary arteries. Mediastinum/Nodes: Hiatal hernia. No mediastinal adenopathy. Lungs/Pleura: Bibasilar atelectasis. Musculoskeletal: No chest wall abnormality. No acute osseous  findings. Review of the MIP images confirms the above findings. CTA ABDOMEN AND PELVIS FINDINGS VASCULAR Aorta: Normal caliber aorta without aneurysm, dissection, vasculitis or hemodynamically significant stenosis. There is moderate aortic atherosclerosis. Celiac: No aneurysm, dissection or hemodynamically significant stenosis. SMA: Widely patent without dissection or stenosis. Renals: Single right and paired left renal arteries. No aneurysm, dissection, stenosis or evidence of fibromuscular dysplasia. IMA: Patent without abnormality. Inflow: No aneurysm, stenosis or dissection. Veins: Normal course and caliber of the major veins. Assessment is otherwise limited by the arterial dominant contrast phase. Review of the MIP images confirms the above findings.  NON-VASCULAR Hepatobiliary: Normal hepatic contours and density. No visible biliary dilatation. Status post cholecystectomy. Pancreas: Normal contours without ductal dilatation. No peripancreatic fluid collection. Spleen: Normal arterial phase splenic enhancement pattern. Adrenals/Urinary Tract: --Adrenal glands: Normal. --Right kidney/ureter: No hydronephrosis or perinephric stranding. No nephrolithiasis. No obstructing ureteral stones. --Left kidney/ureter: No hydronephrosis or perinephric stranding. No nephrolithiasis. No obstructing ureteral stones. --Urinary bladder: Unremarkable. Stomach/Bowel: --Stomach/Duodenum: Small hiatal hernia --Small bowel: No dilatation or inflammation. --Colon: Large stool ball in the rectum. The proximal colon is predominantly fluid-filled. --Appendix: Normal. Lymphatic: No abdominal or pelvic lymphadenopathy. Reproductive: No free fluid in the pelvis. Musculoskeletal. No bony spinal canal stenosis or focal osseous abnormality. Other: None. Review of the MIP images confirms the above findings. IMPRESSION: 1. No acute aortic syndrome or other acute abnormality of the chest, abdomen or pelvis. 2. Large stool ball in the rectum with predominantly fluid-filled proximal colon. 3.  Aortic atherosclerosis (ICD10-I70.0). Electronically Signed   By: Ulyses Jarred M.D.   On: 09/24/2019 21:15    Microbiology: Recent Results (from the past 240 hour(s))  Respiratory Panel by RT PCR (Flu A&B, Covid) - Nasopharyngeal Swab     Status: None   Collection Time: 09/24/19  8:56 PM   Specimen: Nasopharyngeal Swab  Result Value Ref Range Status   SARS Coronavirus 2 by RT PCR NEGATIVE NEGATIVE Final    Comment: (NOTE) SARS-CoV-2 target nucleic acids are NOT DETECTED. The SARS-CoV-2 RNA is generally detectable in upper respiratoy specimens during the acute phase of infection. The lowest concentration of SARS-CoV-2 viral copies this assay can detect is 131 copies/mL. A negative result does not  preclude SARS-Cov-2 infection and should not be used as the sole basis for treatment or other patient management decisions. A negative result may occur with  improper specimen collection/handling, submission of specimen other than nasopharyngeal swab, presence of viral mutation(s) within the areas targeted by this assay, and inadequate number of viral copies (<131 copies/mL). A negative result must be combined with clinical observations, patient history, and epidemiological information. The expected result is Negative. Fact Sheet for Patients:  PinkCheek.be Fact Sheet for Healthcare Providers:  GravelBags.it This test is not yet ap proved or cleared by the [place] FDA and  has been authorized for detection and/or diagnosis of SARS-CoV-2 by FDA under an Emergency Use Authorization (EUA). This EUA will remain  in effect (meaning this test can be used) for the duration of the COVID-19 declaration under Section 564(b)(1) of the Act, 21 U.S.C. section 360bbb-3(b)(1), unless the authorization is terminated or revoked sooner.    Influenza A by PCR NEGATIVE NEGATIVE Final   Influenza B by PCR NEGATIVE NEGATIVE Final    Comment: (NOTE) The Xpert Xpress SARS-CoV-2/FLU/RSV assay is intended as an aid in  the diagnosis of influenza from Nasopharyngeal swab specimens and  should not be used as a sole basis for treatment. Nasal washings and  aspirates are unacceptable for Xpert Xpress SARS-CoV-2/FLU/RSV  testing. Fact Sheet for Patients: PinkCheek.be Fact Sheet for Healthcare Providers: GravelBags.it This test is not yet approved or cleared by the [place] FDA and  has been authorized for detection and/or diagnosis of SARS-CoV-2 by  FDA under an Emergency Use Authorization (EUA). This EUA will remain  in effect (meaning this test can be used) for the duration of the   Covid-19 declaration under Section 564(b)(1) of the Act, 21  U.S.C. section 360bbb-3(b)(1), unless the authorization is  terminated or revoked. Performed at [facility], [address], [place], [place] [address]   Blood culture (routine x 2)     Status: None (Preliminary result)   Collection Time: 09/24/19  9:28 PM   Specimen: BLOOD RIGHT HAND  Result Value Ref Range Status   Specimen Description BLOOD RIGHT HAND  Final   Special Requests   Final    BOTTLES DRAWN AEROBIC AND ANAEROBIC Blood Culture adequate volume   Culture   Final    NO GROWTH < 12 HOURS Performed at [facility], [address] [place], [place] [address]    Report Status PENDING  Incomplete  Blood culture (routine x 2)     Status: None (Preliminary result)   Collection Time: 09/24/19  9:28 PM   Specimen: BLOOD RIGHT HAND  Result Value Ref Range Status   Specimen Description BLOOD RIGHT HAND  Final   Special Requests AEROBIC BOTTLE ONLY Blood Culture adequate volume  Final   Culture   Final    NO GROWTH < 12 HOURS Performed at [facility], [address] [place], [place] [address]    Report Status PENDING  Incomplete  MRSA PCR Screening     Status: None   Collection Time: 09/25/19  4:37 AM   Specimen: Nasal Mucosa; Nasopharyngeal  Result Value Ref Range Status   MRSA by PCR NEGATIVE NEGATIVE Final    Comment:        The GeneXpert MRSA Assay (FDA approved for NASAL specimens only), is one component of a comprehensive MRSA colonization surveillance program. It is not intended to diagnose MRSA infection nor to guide or monitor treatment for MRSA infections. Performed at [facility], [address], [place], [place] [address]      Labs: Basic Metabolic Panel: Recent Labs  Lab 09/24/19 2137 09/25/19 0424  NA 135 137  K 4.3 4.1  CL 104 101  CO2 22 24  GLUCOSE 135* 180*  BUN 23 20  CREATININE 1.04* 0.83  CALCIUM 8.4* 8.5*  MG 2.2  --    Liver Function Tests: Recent Labs  Lab  09/24/19 2137 09/25/19 0424  AST 27 25  ALT 21 19  ALKPHOS 54 50  BILITOT 0.5 0.4  PROT 7.2 7.2  ALBUMIN 4.1 4.0   CBC: Recent Labs  Lab 09/24/19 2027 09/25/19 0424  WBC 10.3 18.6*  NEUTROABS 6.6  --   HGB 14.0 12.6  HCT 44.8 40.4  MCV 90.9 90.6  PLT 273 212   Signed:  Barton Dubois MD.  [facility] 09/25/2019, 3:31 PM

## 2019-09-29 LAB — CULTURE, BLOOD (ROUTINE X 2)
Culture: NO GROWTH
Culture: NO GROWTH
Special Requests: ADEQUATE
Special Requests: ADEQUATE

## 2019-09-30 DIAGNOSIS — E538 Deficiency of other specified B group vitamins: Secondary | ICD-10-CM | POA: Diagnosis not present

## 2019-09-30 DIAGNOSIS — M2550 Pain in unspecified joint: Secondary | ICD-10-CM | POA: Diagnosis not present

## 2019-09-30 DIAGNOSIS — E1142 Type 2 diabetes mellitus with diabetic polyneuropathy: Secondary | ICD-10-CM | POA: Diagnosis not present

## 2019-09-30 DIAGNOSIS — G894 Chronic pain syndrome: Secondary | ICD-10-CM | POA: Diagnosis not present

## 2019-09-30 DIAGNOSIS — R55 Syncope and collapse: Secondary | ICD-10-CM | POA: Diagnosis not present

## 2019-10-04 ENCOUNTER — Other Ambulatory Visit: Payer: Self-pay | Admitting: Internal Medicine

## 2019-10-04 DIAGNOSIS — Z1231 Encounter for screening mammogram for malignant neoplasm of breast: Secondary | ICD-10-CM

## 2019-10-06 DIAGNOSIS — J449 Chronic obstructive pulmonary disease, unspecified: Secondary | ICD-10-CM | POA: Diagnosis not present

## 2019-10-06 DIAGNOSIS — I10 Essential (primary) hypertension: Secondary | ICD-10-CM | POA: Diagnosis not present

## 2019-10-06 DIAGNOSIS — G894 Chronic pain syndrome: Secondary | ICD-10-CM | POA: Diagnosis not present

## 2019-10-22 DIAGNOSIS — L308 Other specified dermatitis: Secondary | ICD-10-CM | POA: Diagnosis not present

## 2019-10-22 DIAGNOSIS — L255 Unspecified contact dermatitis due to plants, except food: Secondary | ICD-10-CM | POA: Diagnosis not present

## 2019-11-03 DIAGNOSIS — G894 Chronic pain syndrome: Secondary | ICD-10-CM | POA: Diagnosis not present

## 2019-11-03 DIAGNOSIS — E7849 Other hyperlipidemia: Secondary | ICD-10-CM | POA: Diagnosis not present

## 2019-11-03 DIAGNOSIS — I10 Essential (primary) hypertension: Secondary | ICD-10-CM | POA: Diagnosis not present

## 2019-11-05 ENCOUNTER — Ambulatory Visit: Payer: Medicare Other | Attending: Internal Medicine

## 2019-12-04 DIAGNOSIS — G894 Chronic pain syndrome: Secondary | ICD-10-CM | POA: Diagnosis not present

## 2019-12-04 DIAGNOSIS — E7849 Other hyperlipidemia: Secondary | ICD-10-CM | POA: Diagnosis not present

## 2019-12-04 DIAGNOSIS — I10 Essential (primary) hypertension: Secondary | ICD-10-CM | POA: Diagnosis not present

## 2019-12-05 ENCOUNTER — Telehealth (HOSPITAL_COMMUNITY): Payer: Self-pay | Admitting: *Deleted

## 2019-12-05 NOTE — Telephone Encounter (Signed)
She needs to make an appt, not seen since Nov

## 2019-12-05 NOTE — Telephone Encounter (Signed)
PCP @  [facility] ,LNP TO CALL TO INFORM THAT PATIENT IS BEING NON ADHERENT :   venlafaxine XR (EFFEXOR XR) 75 MG 24 hr capsule Take 3 capsules (225 mg total) by mouth daily.   WHEN ASKED TO  VERIFIED PATIENT INFORMED THAT SHE TAKES 1 OR 2 A DAY

## 2019-12-06 NOTE — Telephone Encounter (Signed)
Spoke with patient & informed per provider She needs to make an appt, not seen since Nov Appointment scheduled for  12/11/19

## 2019-12-11 ENCOUNTER — Ambulatory Visit (INDEPENDENT_AMBULATORY_CARE_PROVIDER_SITE_OTHER): Payer: Medicare Other | Admitting: Psychiatry

## 2019-12-11 ENCOUNTER — Encounter (HOSPITAL_COMMUNITY): Payer: Self-pay | Admitting: Psychiatry

## 2019-12-11 ENCOUNTER — Other Ambulatory Visit: Payer: Self-pay

## 2019-12-11 DIAGNOSIS — F332 Major depressive disorder, recurrent severe without psychotic features: Secondary | ICD-10-CM | POA: Diagnosis not present

## 2019-12-11 DIAGNOSIS — F411 Generalized anxiety disorder: Secondary | ICD-10-CM

## 2019-12-11 MED ORDER — LORAZEPAM 1 MG PO TABS: ORAL_TABLET | ORAL | 2 refills | Status: DC

## 2019-12-11 MED ORDER — VENLAFAXINE HCL ER 75 MG PO CP24: 225.0000 mg | ORAL_CAPSULE | Freq: Every day | ORAL | 2 refills | Status: DC

## 2019-12-11 MED ORDER — LAMOTRIGINE 100 MG PO TABS: 100.0000 mg | ORAL_TABLET | Freq: Two times a day (BID) | ORAL | 2 refills | Status: DC

## 2019-12-11 NOTE — Progress Notes (Signed)
Virtual Visit via Telephone Note  I connected with Lauren [name] on 12/11/19 at  3:00 PM EDT by telephone and verified that I am speaking with the correct person using two identifiers.   I discussed the limitations, risks, security and privacy concerns of performing an evaluation and management service by telephone and the availability of in person appointments. I also discussed with the patient that there may be a patient responsible charge related to this service. The patient expressed understanding and agreed to proceed.    I discussed the assessment and treatment plan with the patient. The patient was provided an opportunity to ask questions and all were answered. The patient agreed with the plan and demonstrated an understanding of the instructions.   The patient was advised to call back or seek an in-person evaluation if the symptoms worsen or if the condition fails to improve as anticipated.  I provided 15 minutes of non-face-to-face time during this encounter.   Levonne Spiller, MD  [facility] MD/PA/NP OP Progress Note  12/11/2019 3:30 PM Lauren [name]  MRN:  [id number]  Chief Complaint:  Chief Complaint    Depression; Anxiety; Manic Behavior; Follow-up     HPI: This patient is a 69 year old married white female who lives with her husband. She has one stepchild. Her step son killed himself12 years ago.Marland Kitchen He was an alcoholic. The patient is on disability for fibromyalgia  The patient has a long history of depression. She was last hospitalized in 2010 after she took too many pain pills and got confused. She use to drink heavily but has been sober 23 years and is very active in [employer] and church. For the most part she feels her mood is stable. She sleeping pretty well. Her energy is good. She has a hard time functioning without Ativan but she only usually takes one pill a day side think this is reasonable. She tried a higher dose of Cymbalta but it made her manic and revved up  The patient  returns for follow-up after about 5 months.  She states that she is still dealing with her husband's disability.  He had hip surgery and is addicted to pain medication by her report.  He has not been compliant with his other medicines or with physical therapy and she is getting very tired of waiting on him all the time.  He finally agreed to go to a [place] run rehab program.  She herself was hospitalized overnight in January after a vasovagal event.  She had not been eating or drinking enough but is doing better now.  She states that given all the stress she has been under her mood has been pretty stable.  She recently had to put her dog to sleep after 15 years and this was very difficult.  She is sleeping well and for the most part her mood has been stable and her anxiety is well controlled. Visit Diagnosis:    ICD-10-CM   1. Major depressive disorder, recurrent, severe without psychotic features ([place])  F33.2   2. Generalized anxiety disorder  F41.1     Past Psychiatric History: Long history of depression and mood instability.  She was last hospitalized about 4 years ago for suicidal ideation  Past Medical History:  Past Medical History:  Diagnosis Date  . Anxiety   . Chronic pain   . COPD (chronic obstructive pulmonary disease) ([place])   . Depression   . Fibromyalgia   . Fibromyalgia   . GERD (gastroesophageal reflux disease)   .  Hepatitis C   . History of blood transfusion   . History of bronchitis   . IBS (irritable bowel syndrome)   . Osteoarthritis   . Pneumonia   . Prediabetes   . Shortness of breath dyspnea    allergies; increased pain;   . Vertigo   . Wears glasses     Past Surgical History:  Procedure Laterality Date  . CATARACT EXTRACTION W/PHACO Right 08/01/2017   Procedure: CATARACT EXTRACTION PHACO AND INTRAOCULAR LENS PLACEMENT RIGHT EYE;  Surgeon: Rutherford Guys, MD;  Location: [facility];  Service: Ophthalmology;  Laterality: Right;  CDE: 3.30  . CATARACT EXTRACTION W/PHACO  Left 08/15/2017   Procedure: CATARACT EXTRACTION PHACO AND INTRAOCULAR LENS PLACEMENT (IOC);  Surgeon: Rutherford Guys, MD;  Location: [facility];  Service: Ophthalmology;  Laterality: Left;  CDE: 4.00  . CHOLECYSTECTOMY    . COLONOSCOPY    . COLONOSCOPY N/A 10/23/2014   Procedure: COLONOSCOPY;  Surgeon: Rogene Houston, MD;  Location[facility];  Service: Endoscopy;  Laterality: N/A;  1030  . ESOPHAGOGASTRODUODENOSCOPY N/A 10/23/2014   Procedure: ESOPHAGOGASTRODUODENOSCOPY (EGD);  Surgeon: Rogene Houston, MD;  Location[facility];  Service: Endoscopy;  Laterality: N/A;  . KNEE ARTHROSCOPY Left 10/21/2015   Procedure: ARTHROSCOPY LEFT KNEE WITH MENICAL DEBRIDEMENT, Chondroplasty;  Surgeon: Gaynelle Arabian, MD;  Location: [facility];  Service: Orthopedics;  Laterality: Left;  Marland Kitchen MANDIBLE SURGERY  09/06/1979  . ORIF WRIST FRACTURE Right 07/23/2013   Procedure: OPEN REDUCTION INTERNAL FIXATION (ORIF) WRIST FRACTURE;  Surgeon: Roseanne Kaufman, MD;  Location: [place];  Service: Orthopedics;  Laterality: Right;  . TONSILLECTOMY    . TOTAL KNEE ARTHROPLASTY     2011 rt knee  . TOTAL KNEE ARTHROPLASTY Left 04/16/2018   Procedure: LEFT TOTAL KNEE ARTHROPLASTY;  Surgeon: Gaynelle Arabian, MD;  Location: [facility];  Service: Orthopedics;  Laterality: Left;  . UPPER GASTROINTESTINAL ENDOSCOPY      Family Psychiatric History: see below  Family History:  Family History  Problem Relation Age of Onset  . Alcohol abuse Father   . Diabetes Father   . Stroke Father   . Depression Father   . Alcohol abuse Maternal Grandfather   . Alcohol abuse Maternal Grandmother   . Alcohol abuse Paternal Grandfather   . Alcohol abuse Paternal Grandmother   . Stroke Mother   . Irritable bowel syndrome Mother   . Sexual abuse Mother   . Diabetes Brother   . Healthy Brother   . Diabetes Brother   . Heart disease Brother   . Anxiety disorder Paternal Uncle   . Alcohol abuse Paternal Uncle   . Alcohol abuse Cousin   . Anxiety  disorder Maternal Uncle   . ADD / ADHD Neg Hx   . Bipolar disorder Neg Hx   . Dementia Neg Hx   . Drug abuse Neg Hx   . Paranoid behavior Neg Hx   . Schizophrenia Neg Hx   . Seizures Neg Hx   . Physical abuse Neg Hx     Social History:  Social History   Socioeconomic History  . Marital status: Married    Spouse name: Not on file  . Number of children: Not on file  . Years of education: Not on file  . Highest education level: Not on file  Occupational History  . Not on file  Tobacco Use  . Smoking status: Former Smoker    Packs/day: 2.00    Years: 15.00    Pack years: 30.00  Types: Cigarettes    Quit date: 09/05/1982    Years since quitting: 37.2  . Smokeless tobacco: Never Used  Substance and Sexual Activity  . Alcohol use: No    Alcohol/week: 0.0 standard drinks    Comment: history of alcoholism; pt states has been sober 28 years  . Drug use: No  . Sexual activity: Never  Other Topics Concern  . Not on file  Social History Narrative  . Not on file   Social Determinants of Health   Financial Resource Strain:   . Difficulty of Paying Living Expenses:   Food Insecurity:   . Worried About Charity fundraiser in the Last Year:   . Arboriculturist in the Last Year:   Transportation Needs:   . Film/video editor (Medical):   Marland Kitchen Lack of Transportation (Non-Medical):   Physical Activity:   . Days of Exercise per Week:   . Minutes of Exercise per Session:   Stress:   . Feeling of Stress :   Social Connections:   . Frequency of Communication with Friends and Family:   . Frequency of Social Gatherings with Friends and Family:   . Attends Religious Services:   . Active Member of Clubs or Organizations:   . Attends Archivist Meetings:   Marland Kitchen Marital Status:     Allergies:  Allergies  Allergen Reactions  . Elavil [Amitriptyline] Other (See Comments)    Vivid dreams and almost suicidal  . Flagyl [Metronidazole] Itching  . Vistaril [Hydroxyzine Hcl]  Other (See Comments)    Got higher than a kite on too much of this.  [place] [Ziprasidone Hydrochloride] Other (See Comments)    Blacked out  . Lactose Intolerance (Gi) Other (See Comments)    GI upset  . Latex Other (See Comments)    Red at site    Metabolic Disorder Labs: Lab Results  Component Value Date   HGBA1C 6.1 (H) 10/13/2015   MPG 128 10/13/2015   No results found for: PROLACTIN No results found for: CHOL, TRIG, HDL, CHOLHDL, VLDL, LDLCALC Lab Results  Component Value Date   TSH 2.400 03/19/2014    Therapeutic Level Labs: No results found for: LITHIUM No results found for: VALPROATE No components found for:  CBMZ  Current Medications: Current Outpatient Medications  Medication Sig Dispense Refill  . albuterol (PROAIR HFA) 108 (90 BASE) MCG/ACT inhaler Inhale 2 puffs into the lungs every 4 (four) hours as needed for wheezing or shortness of breath.    . Biotin w/ Vitamins C & E (HAIR/SKIN/NAILS PO) Take 2 tablets by mouth daily.    . budesonide-formoterol (SYMBICORT) 160-4.5 MCG/ACT inhaler Inhale 2 puffs into the lungs 2 (two) times daily.    . calcium-vitamin D (OSCAL WITH D) 500-200 MG-UNIT per tablet Take 1 tablet by mouth 2 (two) times daily. For low calcium    . Coenzyme Q10 (COQ10) 100 MG CAPS Take 1 capsule daily by mouth.    . Cyanocobalamin (B-12) 2000 MCG TABS Take 2,000 mcg by mouth daily.    . diclofenac sodium (VOLTAREN) 1 % GEL Apply 2 g topically 4 (four) times daily as needed (For pain.).     Marland Kitchen dicyclomine (BENTYL) 20 MG tablet Take 1 tablet (20 mg total) by mouth every 6 (six) hours as needed for spasms. 90 tablet 2  . diphenoxylate-atropine (LOMOTIL) 2.5-0.025 MG tablet TAKE 1 TABLET BY MOUTH 3 TIMES A DAY AS NEEDED 60 tablet 0  . gabapentin (NEURONTIN) 100  MG capsule Take 200-300 mg by mouth See admin instructions. Take 200mg  by mouth every day in the morning and take 300mg  at bedtime    . HYDROcodone-homatropine (HYCODAN) 5-1.5 MG/5ML syrup Take  5 mLs by mouth every 6 (six) hours as needed for cough.    . lamoTRIgine (LAMICTAL) 100 MG tablet Take 1 tablet (100 mg total) by mouth 2 (two) times daily. 180 tablet 2  . lisinopril (ZESTRIL) 5 MG tablet Take 5 mg by mouth at bedtime.    Marland Kitchen LORazepam (ATIVAN) 1 MG tablet TAKE 1 TABLET BY MOUTH 3 TIMES DAILY AS NEEDED FOR ANXIETY. 90 tablet 2  . Misc Natural Products (OSTEO BI-FLEX JOINT SHIELD PO) Take 1 tablet by mouth daily.    . Multiple Vitamins-Minerals (MULTIVITAMIN WITH MINERALS) tablet Take 1 tablet daily by mouth.    . pantoprazole (PROTONIX) 40 MG tablet Take 40 mg by mouth 2 (two) times daily.    . pravastatin (PRAVACHOL) 80 MG tablet Take 80 mg at bedtime by mouth.     . prochlorperazine (COMPAZINE) 10 MG tablet Take 1 tablet (10 mg total) by mouth every 6 (six) hours as needed for nausea or vomiting. 30 tablet 0  . ranitidine (ZANTAC) 150 MG tablet TAKE 1 TABLET BY MOUTH TWICE A DAY 180 tablet 3  . Turmeric 450 MG CAPS Take 450 mg by mouth daily.    Marland Kitchen venlafaxine XR (EFFEXOR XR) 75 MG 24 hr capsule Take 3 capsules (225 mg total) by mouth daily with breakfast. 270 capsule 2  . vitamin E 400 UNIT capsule Take 400 Units daily by mouth.     No current facility-administered medications for this visit.     Musculoskeletal: Strength & Muscle Tone: within normal limits Gait & Station: normal Patient leans: N/A  Psychiatric Specialty Exam: Review of Systems  Musculoskeletal: Positive for arthralgias, joint swelling and myalgias.  Psychiatric/Behavioral: The patient is nervous/anxious.   All other systems reviewed and are negative.   There were no vitals taken for this visit.There is no height or weight on file to calculate BMI.  General Appearance: NA  Eye Contact:  NA  Speech:  Clear and Coherent  Volume:  Increased  Mood:  Euthymic  Affect:  NA  Thought Process:  Goal Directed  Orientation:  Full (Time, Place, and Person)  Thought Content: Rumination   Suicidal Thoughts:   No  Homicidal Thoughts:  No  Memory:  Immediate;   Good Recent;   Good Remote;   Good  Judgement:  Good  Insight:  Good  Psychomotor Activity:  Normal  Concentration:  Concentration: Good and Attention Span: Good  Recall:  Good  Fund of Knowledge: Good  Language: Good  Akathisia:  No  Handed:  Right  AIMS (if indicated): not done  Assets:  Communication Skills Desire for Improvement Resilience Social Support Talents/Skills  ADL's:  Intact  Cognition: WNL  Sleep:  Good   Screenings: AUDIT     Admission (Discharged) from 03/18/2014 in [facility]  Alcohol Use Disorder Identification Test Final Score (AUDIT)  0    GAD-7     Counselor from 06/20/2018 in [facility]  Total GAD-7 Score  16    PHQ2-9     Counselor from 06/20/2018 in [facility]  PHQ-2 Total Score  3  PHQ-9 Total Score  14       Assessment and Plan: This patient is a 69 year old female with depression anxiety  and possibly bipolar disorder.  She seems to be fairly stable based despite all the stressors with her husband.  For now she will continue Ativan 1 mg three times daily as needed for anxiety.  She has increased Lamictal on her own to 100 mg twice daily and it seems to be helping with her mood stabilization.  She will continue Effexor XR 225 mg daily for depression.  She will return to see me in 3 months.   Levonne Spiller, MD 12/11/2019, 3:30 PM

## 2019-12-27 DIAGNOSIS — G894 Chronic pain syndrome: Secondary | ICD-10-CM | POA: Diagnosis not present

## 2019-12-27 DIAGNOSIS — L309 Dermatitis, unspecified: Secondary | ICD-10-CM | POA: Diagnosis not present

## 2019-12-31 DIAGNOSIS — M064 Inflammatory polyarthropathy: Secondary | ICD-10-CM | POA: Diagnosis not present

## 2019-12-31 DIAGNOSIS — M1990 Unspecified osteoarthritis, unspecified site: Secondary | ICD-10-CM | POA: Diagnosis not present

## 2019-12-31 DIAGNOSIS — M79641 Pain in right hand: Secondary | ICD-10-CM | POA: Diagnosis not present

## 2019-12-31 DIAGNOSIS — M810 Age-related osteoporosis without current pathological fracture: Secondary | ICD-10-CM | POA: Diagnosis not present

## 2019-12-31 DIAGNOSIS — E119 Type 2 diabetes mellitus without complications: Secondary | ICD-10-CM | POA: Diagnosis not present

## 2019-12-31 DIAGNOSIS — M6530 Trigger finger, unspecified finger: Secondary | ICD-10-CM | POA: Diagnosis not present

## 2019-12-31 DIAGNOSIS — M79642 Pain in left hand: Secondary | ICD-10-CM | POA: Diagnosis not present

## 2020-01-03 DIAGNOSIS — G894 Chronic pain syndrome: Secondary | ICD-10-CM | POA: Diagnosis not present

## 2020-01-03 DIAGNOSIS — E7849 Other hyperlipidemia: Secondary | ICD-10-CM | POA: Diagnosis not present

## 2020-01-03 DIAGNOSIS — I10 Essential (primary) hypertension: Secondary | ICD-10-CM | POA: Diagnosis not present

## 2020-01-17 DIAGNOSIS — R05 Cough: Secondary | ICD-10-CM | POA: Diagnosis not present

## 2020-01-17 DIAGNOSIS — R070 Pain in throat: Secondary | ICD-10-CM | POA: Diagnosis not present

## 2020-01-28 DIAGNOSIS — G894 Chronic pain syndrome: Secondary | ICD-10-CM | POA: Diagnosis not present

## 2020-01-28 DIAGNOSIS — M1991 Primary osteoarthritis, unspecified site: Secondary | ICD-10-CM | POA: Diagnosis not present

## 2020-01-28 DIAGNOSIS — Z1389 Encounter for screening for other disorder: Secondary | ICD-10-CM | POA: Diagnosis not present

## 2020-01-28 DIAGNOSIS — Z0000 Encounter for general adult medical examination without abnormal findings: Secondary | ICD-10-CM | POA: Diagnosis not present

## 2020-01-28 DIAGNOSIS — E1140 Type 2 diabetes mellitus with diabetic neuropathy, unspecified: Secondary | ICD-10-CM | POA: Diagnosis not present

## 2020-01-28 DIAGNOSIS — K219 Gastro-esophageal reflux disease without esophagitis: Secondary | ICD-10-CM | POA: Diagnosis not present

## 2020-02-17 DIAGNOSIS — L821 Other seborrheic keratosis: Secondary | ICD-10-CM | POA: Diagnosis not present

## 2020-02-17 DIAGNOSIS — Z1283 Encounter for screening for malignant neoplasm of skin: Secondary | ICD-10-CM | POA: Diagnosis not present

## 2020-02-17 DIAGNOSIS — L570 Actinic keratosis: Secondary | ICD-10-CM | POA: Diagnosis not present

## 2020-02-17 DIAGNOSIS — X32XXXA Exposure to sunlight, initial encounter: Secondary | ICD-10-CM | POA: Diagnosis not present

## 2020-02-17 DIAGNOSIS — L720 Epidermal cyst: Secondary | ICD-10-CM | POA: Diagnosis not present

## 2020-02-18 DIAGNOSIS — M25532 Pain in left wrist: Secondary | ICD-10-CM | POA: Diagnosis not present

## 2020-02-18 DIAGNOSIS — M810 Age-related osteoporosis without current pathological fracture: Secondary | ICD-10-CM | POA: Diagnosis not present

## 2020-02-18 DIAGNOSIS — M064 Inflammatory polyarthropathy: Secondary | ICD-10-CM | POA: Diagnosis not present

## 2020-02-18 DIAGNOSIS — E119 Type 2 diabetes mellitus without complications: Secondary | ICD-10-CM | POA: Diagnosis not present

## 2020-02-18 DIAGNOSIS — M6530 Trigger finger, unspecified finger: Secondary | ICD-10-CM | POA: Diagnosis not present

## 2020-02-18 DIAGNOSIS — M1990 Unspecified osteoarthritis, unspecified site: Secondary | ICD-10-CM | POA: Diagnosis not present

## 2020-02-20 ENCOUNTER — Other Ambulatory Visit: Payer: Self-pay

## 2020-02-20 ENCOUNTER — Ambulatory Visit (INDEPENDENT_AMBULATORY_CARE_PROVIDER_SITE_OTHER): Payer: Medicare Other | Admitting: Psychiatry

## 2020-02-20 DIAGNOSIS — F332 Major depressive disorder, recurrent severe without psychotic features: Secondary | ICD-10-CM | POA: Diagnosis not present

## 2020-02-20 DIAGNOSIS — F411 Generalized anxiety disorder: Secondary | ICD-10-CM | POA: Diagnosis not present

## 2020-02-21 ENCOUNTER — Encounter (HOSPITAL_COMMUNITY): Payer: Self-pay | Admitting: Psychiatry
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
## 2020-03-04 DIAGNOSIS — I10 Essential (primary) hypertension: Secondary | ICD-10-CM | POA: Diagnosis not present

## 2020-03-04 DIAGNOSIS — E1140 Type 2 diabetes mellitus with diabetic neuropathy, unspecified: Secondary | ICD-10-CM | POA: Diagnosis not present

## 2020-03-04 DIAGNOSIS — M1991 Primary osteoarthritis, unspecified site: Secondary | ICD-10-CM | POA: Diagnosis not present

## 2020-03-04 DIAGNOSIS — K219 Gastro-esophageal reflux disease without esophagitis: Secondary | ICD-10-CM | POA: Diagnosis not present

## 2020-03-16 ENCOUNTER — Other Ambulatory Visit: Payer: Self-pay

## 2020-03-16 ENCOUNTER — Ambulatory Visit (INDEPENDENT_AMBULATORY_CARE_PROVIDER_SITE_OTHER): Payer: Medicare Other | Admitting: Psychiatry

## 2020-03-16 DIAGNOSIS — F411 Generalized anxiety disorder: Secondary | ICD-10-CM

## 2020-03-16 DIAGNOSIS — F332 Major depressive disorder, recurrent severe without psychotic features: Secondary | ICD-10-CM

## 2020-03-16 NOTE — Progress Notes (Signed)
Virtual Visit via Video Note  I connected with Lauren [name] on 03/16/20 at  3:00 PM EDT by a video enabled telemedicine application and verified that I am speaking with the correct person using two identifiers.   I discussed the limitations of evaluation and management by telemedicine and the availability of in person appointments. The patient expressed understanding and agreed to proceed.  I provided 50 minutes of non-face-to-face time during this encounter.   [name], LCSW    THERAPIST PROGRESS NOTE    Location:  Patient - Home/Provider - [place] office   Session Time: Monday 03/16/2020 3:00 PM - 3:50 PM  Participation Level: Active  Behavioral Response: CasualAlertAngry, Anxious and Depressed  Type of Therapy: Individual Therapy  Treatment Goals addressed: Patient wants to be able to assert self with husband, let go of anger, guilt, and forgive self/improve assertiveness skills and identify/challenge/and replace thoughts evoking inappropriate guilt, resolved feelings of anger  Interventions: CBT and Supportive  Summary: Lauren [name] is a 69 y.o. female who presents who is referred for services due to stress, anxiety, and depression. She has participated in therapy intermittently for past 25 years. She reports 3 psychiatric hospitalizations with the last one occuring 4 years ago at [facility].   She reports increased stress related to husband's medical condition as he fell, broke his hip, and was placed in a nursing home.  Patient reports bleeding self for husband falling last year.  She reports having a hard time coping.  Symptoms include shame, blame, guilt, ruminating thoughts, feeling overwhelmed, depressed mood, excessive worry, and anxiety.  Patient last was seen via virtual visit about 2 to 3 weeks ago.  She is experiencing moderate symptoms of depression and anxiety included excessive worry, irritability, and anger.  She  reports increased stress as husband has been discharged home and continues to need significant care.  They recently learned that the top of her husband's femur is not healing properly.  Patient is fearful of touching husband and blames self for his fall as well as the femur not healing properly.  She also reports stress related to husband constantly calling her and sometimes yelling.  This triggers memories of patient's trauma history.  She expresses frustration and anger regarding her husband failing to follow doctor's instructions.  She also reports she reports strong support from neighbors and friends but reports having to ask for help.  However she will do so if needed.  [facility].  She has been able to attend a few women's AA meetings.  She also relies on her spirituality and attends church via [employer].   Suicidal/Homicidal: Nowithout intent/plan  Therapist Response: Reestablish rapport, reviewed symptoms, discussed stressors, facilitated expression of thoughts and feelings, validated feelings, praised and reinforced patient's efforts to use helpful coping techniques, reviewed/developed treatment plan, obtained patient's permission to initial plan for patient as this was a virtual visit, assisted patient identify ways to utilize her support system, encourage patient to follow through with contact with [place] social worker, reviewed relaxation techniques such as deep breathing/and mindfulness activities using breath awareness, encouraged patient to practice regularly, will send patient grounding techniques in preparation for next session  Plan: Return again in 2 weeks.  Diagnosis: Axis I: MDD, Recurrent, GAD      [name], LCSW 03/16/2020

## 2020-03-30 ENCOUNTER — Other Ambulatory Visit: Payer: Self-pay

## 2020-03-30 ENCOUNTER — Ambulatory Visit (HOSPITAL_COMMUNITY): Payer: Medicare Other | Admitting: Psychiatry

## 2020-03-30 ENCOUNTER — Telehealth (HOSPITAL_COMMUNITY): Payer: Self-pay | Admitting: Psychiatry

## 2020-03-30 NOTE — Telephone Encounter (Signed)
Therapist attempted to contact patient twice via text for scheduled appointment through care agility platform, no response.  Therapist called patient's cell phone as well as her home phone, left messages indicating attempt, and requesting patient call office.

## 2020-04-02 ENCOUNTER — Telehealth (HOSPITAL_COMMUNITY): Payer: Self-pay | Admitting: Psychiatry

## 2020-04-02 NOTE — Telephone Encounter (Signed)
Therapist returned call to patient and received voicemail for cell phone.   Therapist left message indicating attempt to contact patient and expressing condolences, requested patient call office.  Therapist also attempted to contact patient on her home phone.

## 2020-04-03 ENCOUNTER — Ambulatory Visit (INDEPENDENT_AMBULATORY_CARE_PROVIDER_SITE_OTHER): Payer: Medicare Other | Admitting: Psychiatry

## 2020-04-03 ENCOUNTER — Other Ambulatory Visit: Payer: Self-pay

## 2020-04-03 DIAGNOSIS — I10 Essential (primary) hypertension: Secondary | ICD-10-CM | POA: Diagnosis not present

## 2020-04-03 DIAGNOSIS — M1991 Primary osteoarthritis, unspecified site: Secondary | ICD-10-CM | POA: Diagnosis not present

## 2020-04-03 DIAGNOSIS — E1140 Type 2 diabetes mellitus with diabetic neuropathy, unspecified: Secondary | ICD-10-CM | POA: Diagnosis not present

## 2020-04-03 DIAGNOSIS — F332 Major depressive disorder, recurrent severe without psychotic features: Secondary | ICD-10-CM

## 2020-04-03 DIAGNOSIS — K219 Gastro-esophageal reflux disease without esophagitis: Secondary | ICD-10-CM | POA: Diagnosis not present

## 2020-04-03 DIAGNOSIS — F411 Generalized anxiety disorder: Secondary | ICD-10-CM | POA: Diagnosis not present

## 2020-04-03 NOTE — Progress Notes (Signed)
Virtual Visit via Video Note  I connected with [name] on 04/03/20 at 10:00 AM EDT by a video enabled telemedicine application and verified that I am speaking with the correct person using two identifiers.   I discussed the limitations of evaluation and management by telemedicine and the availability of in person appointments. The patient expressed understanding and agreed to proceed.  I provided 25 minutes of non-face-to-face time during this encounter.   [name], LCSW    THERAPIST PROGRESS NOTE    Location:  Patient - Home/Provider - [place] office   Session Time: Friday 04/03/2020 10:00 AM - 10:25 AM  Participation Level: Active  Behavioral Response: CasualAlertAngry, Anxious and Depressed  Type of Therapy: Individual Therapy  Treatment Goals addressed: Begin healthy grieving process  Interventions: CBT and Supportive  Summary: Lauren [name] is a 69 y.o. female who presents who is referred for services due to stress, anxiety, and depression. She has participated in therapy intermittently for past 25 years. She reports 3 psychiatric hospitalizations with the last one occuring 4 years ago at [facility].   She reports increased stress related to husband's medical condition as he fell, broke his hip, and was placed in a nursing home.  Patient reports bleeding self for husband falling last year.  She reports having a hard time coping.  Symptoms include shame, blame, guilt, ruminating thoughts, feeling overwhelmed, depressed mood, excessive worry, and anxiety.  Patient last was seen via virtual visit about 2 to 3 weeks ago.  She is experiencing grief and loss issues as her husband died on April 20, 2023 enforced and moral service was held on July 27.  Patient expresses frustration with self and regret as she reports she felt pressured about making service arrangements.  She reports ruminating thoughts about what she wished she had done in the  days just prior to his death.  She reports good support from friends and neighbors.  She is experiencing anger regarding her husband's daughter as well as some of her husband's medical providers.  Patient reports trying to maintain positive self-care.  She is trying to stay involved with social contacts.  She plans to attend a women's meeting tonight and plans to go swimming with her friend this morning.  Patient is aware of the stages of grief and has been reading information. Suicidal/Homicidal: Nowithout intent/plan  Therapist Response: reviewed symptoms, discussed stressors, facilitated expression of thoughts and feelings, validated and normalized feelings related to grief and loss, praised and reinforced patient's efforts to maintain social contacts, assisted patient identify ways to use her resources and her support group, reviewed the role of self-care, will send patient handout on the grieving process as well as the grief deck  Plan: Return again in 2 weeks.  Diagnosis: Axis I: MDD, Recurrent, GAD

## 2020-04-13 ENCOUNTER — Other Ambulatory Visit: Payer: Self-pay

## 2020-04-13 ENCOUNTER — Ambulatory Visit (HOSPITAL_COMMUNITY): Payer: Medicare Other | Admitting: Psychiatry

## 2020-05-07 ENCOUNTER — Other Ambulatory Visit: Payer: Self-pay

## 2020-05-07 ENCOUNTER — Ambulatory Visit (INDEPENDENT_AMBULATORY_CARE_PROVIDER_SITE_OTHER): Payer: Medicare Other | Admitting: Psychiatry

## 2020-05-07 DIAGNOSIS — F332 Major depressive disorder, recurrent severe without psychotic features: Secondary | ICD-10-CM

## 2020-05-07 DIAGNOSIS — F411 Generalized anxiety disorder: Secondary | ICD-10-CM

## 2020-05-07 NOTE — Progress Notes (Signed)
Virtual Visit via Video Note  I connected with Lauren [name] on 05/07/20 at 10:10 AM EDT  by a video enabled telemedicine application and verified that I am speaking with the correct person using two identifiers.   I discussed the limitations of evaluation and management by telemedicine and the availability of in person appointments. The patient expressed understanding and agreed to proceed.  I provided 48 minutes of non-face-to-face time during this encounter.   [name], LCSW    THERAPIST PROGRESS NOTE    Location:  Patient - Home/Provider - [place] office   Session Time: Thursday 05/07/2020 10:10 AM -  10:58 AM    Participation Level: Active  Behavioral Response: CasualAlertAngry, Anxious and Depressed  Type of Therapy: Individual Therapy  Treatment Goals addressed: Begin healthy grieving process  Interventions: CBT and Supportive  Summary: Lauren [name] is a 69 y.o. female who presents who is referred for services due to stress, anxiety, and depression. She has participated in therapy intermittently for past 25 years. She reports 3 psychiatric hospitalizations with the last one occuring 4 years ago at [facility].   She reports increased stress related to husband's medical condition as he fell, broke his hip, and was placed in a nursing home.  Patient reports bleeding self for husband falling last year.  She reports having a hard time coping.  Symptoms include shame, blame, guilt, ruminating thoughts, feeling overwhelmed, depressed mood, excessive worry, and anxiety.  Patient last was seen via virtual visit about 4 weeks ago.  She continues to experience grief and loss issues regarding the death of her husband in 2020/04/10.  She continues her efforts to maintain positive self-care, involvement in activities, and social involvement.  She reports continued strong support from neighbors and friends.  She continues to attend a women's meeting  and swim at the [facility].  She also reports recently going on a beach trip with a friend and reports this was very helpful.  She  has been reading literature to her on the stages of grief as well as the materials clinician sent to patient via mail.  She reports this has been helpful as well.  She reports stress and feeling overwhelmed  managing her husband's financial affairs/settling the estate. She reports pattern of should and ought statements regarding completing tasks. She verbalizes feelings of guilt and blames self for not managing issues overreacting to husband in the way she thinks she should have reacted.  Suicidal/Homicidal: Nowithout intent/plan  Therapist Response: reviewed symptoms, praised and reinforced patient's efforts to remain involved in activities and maintain social involvement, discussed stressors, facilitated expression of thoughts and feelings, validated and normalized feelings related to grief and loss, assisted patient identify ways to pace self and prioritize tasks regarding financial issues/settling husband's estate, facilitated patient identifying and verbalizing feelings of guilt related to husband, assisted patient identify/challenge/and replace thoughts evoking inappropriate guilt with helpful alternative, discussed self-compassion,  also assisted patient replace should and ought  statements with helpful alternatives  Plan: Return again in 2 weeks.  Diagnosis: Axis I: MDD, Recurrent, GAD

## 2020-05-26 ENCOUNTER — Other Ambulatory Visit: Payer: Self-pay

## 2020-05-26 ENCOUNTER — Ambulatory Visit (INDEPENDENT_AMBULATORY_CARE_PROVIDER_SITE_OTHER): Payer: Medicare Other | Admitting: Psychiatry

## 2020-05-26 DIAGNOSIS — F411 Generalized anxiety disorder: Secondary | ICD-10-CM | POA: Diagnosis not present

## 2020-05-26 DIAGNOSIS — F332 Major depressive disorder, recurrent severe without psychotic features: Secondary | ICD-10-CM | POA: Diagnosis not present

## 2020-05-26 NOTE — Progress Notes (Signed)
Virtual Visit via Video Note  I connected with Lauren [name] on 05/26/20 at 2:12 PM EDT by a video enabled telemedicine application and verified that I am speaking with the correct person using two identifiers.   I discussed the limitations of evaluation and management by telemedicine and the availability of in person appointments. The patient expressed understanding and agreed to proceed.  I provided 46 minutes of non-face-to-face time during this encounter.   [name], LCSW    THERAPIST PROGRESS NOTE    Location:  Patient - Home/Provider - [place] office   Session Time: Tuesday  05/26/2020 2:12 PM - 2:58 PM  Participation Level: Active  Behavioral Response: CasualAlertAngry, Anxious and Depressed  Type of Therapy: Individual Therapy  Treatment Goals addressed: Begin healthy grieving process  Interventions: CBT and Supportive  Summary: Lauren [name] is a 69 y.o. female who presents who is referred for services due to stress, anxiety, and depression. She has participated in therapy intermittently for past 25 years. She reports 3 psychiatric hospitalizations with the last one occuring 4 years ago at [facility].   She reports increased stress related to husband's medical condition as he fell, broke his hip, and was placed in a nursing home.  Patient reports bleeding self for husband falling last year.  She reports having a hard time coping.  Symptoms include shame, blame, guilt, ruminating thoughts, feeling overwhelmed, depressed mood, excessive worry, and anxiety.  Patient last was seen via virtual visit about 2 weeks ago.  She continues to experience grief and loss issues regarding the death of her husband in 04-20-2020.  She  continues her efforts to maintain positive self-care, involvement in activities, and social involvement.  She reports continued strong support from neighbors and friends.  She continues to attend a women's meeting and  swim at the [facility].  However, she reports increased depressed mood along with increased feelings of guilt for the past week.  She blames self for her actions and reactions during her husband's illness.  She also reports recently going on a beach trip with a friend and having increased negative thoughts about self triggered by interaction with her friend while at the beach.  Suicidal/Homicidal: Nowithout intent/plan  Therapist Response: reviewed symptoms, praised and reinforced patient's efforts to remain involved in activities and maintain social involvement, discussed stressors, facilitated expression of thoughts and feelings, validated and normalized feelings related to grief and loss, assisted patient identify triggers of increased depressed mood and increased feelings of guilt, assisted patient identify/challenge/and replace negative thoughts about self as well as dispel inappropriate guilt, began to discuss the stages of grief, will send patient handout on stages in preparation for next session  Plan: Return again in 2 weeks.  Diagnosis: Axis I: MDD, Recurrent, GAD

## 2020-06-04 DIAGNOSIS — I10 Essential (primary) hypertension: Secondary | ICD-10-CM | POA: Diagnosis not present

## 2020-06-04 DIAGNOSIS — G894 Chronic pain syndrome: Secondary | ICD-10-CM | POA: Diagnosis not present

## 2020-06-04 DIAGNOSIS — E7849 Other hyperlipidemia: Secondary | ICD-10-CM | POA: Diagnosis not present

## 2020-06-08 DIAGNOSIS — M79671 Pain in right foot: Secondary | ICD-10-CM | POA: Diagnosis not present

## 2020-06-08 DIAGNOSIS — M7661 Achilles tendinitis, right leg: Secondary | ICD-10-CM | POA: Diagnosis not present

## 2020-06-09 ENCOUNTER — Ambulatory Visit (INDEPENDENT_AMBULATORY_CARE_PROVIDER_SITE_OTHER): Payer: Medicare Other | Admitting: Psychiatry

## 2020-06-09 ENCOUNTER — Other Ambulatory Visit: Payer: Self-pay

## 2020-06-09 DIAGNOSIS — F332 Major depressive disorder, recurrent severe without psychotic features: Secondary | ICD-10-CM | POA: Diagnosis not present

## 2020-06-09 NOTE — Progress Notes (Signed)
Virtual Visit via Video Note  I connected with Lauren [name] on 06/09/20 at 3:12 PM EDT by a video enabled telemedicine application and verified that I am speaking with the correct person using two identifiers.   I discussed the limitations of evaluation and management by telemedicine and the availability of in person appointments. The patient expressed understanding and agreed to proceed.  I provided 48 minutes of non-face-to-face time during this encounter.   [name], LCSW   THERAPIST PROGRESS NOTE    Location:  Patient - Home/Provider - [place] office   Session Time: Tuesday  06/09/2020 3:12 PM - 4:00 PM  Participation Level: Active  Behavioral Response: CasualAlertAngry, Anxious and Depressed,  Type of Therapy: Individual Therapy  Treatment Goals addressed: Begin healthy grieving process  Interventions: CBT and Supportive  Summary: Lauren [name] is a 69 y.o. female who presents who is referred for services due to stress, anxiety, and depression. She has participated in therapy intermittently for past 25 years. She reports 3 psychiatric hospitalizations with the last one occuring 4 years ago at [facility].   She reports increased stress related to husband's medical condition as he fell, broke his hip, and was placed in a nursing home.  Patient reports bleeding self for husband falling last year.  She reports having a hard time coping.  Symptoms include shame, blame, guilt, ruminating thoughts, feeling overwhelmed, depressed mood, excessive worry, and anxiety.  Patient last was seen via virtual visit about 2 weeks ago.  She continues to experience grief and loss issues regarding the death of her husband in 2020-04-15.  She reports increased stress and anxiety as she is adjusting to managing financial affairs without husband and his income.  She also reports continued feelings of guilt for her actions and feelings during her husband's  illness.  She verbalizes anger with husband and others. She states being more irritable. She reports additional stress related due to being sick for the past few days.  Patient reports decreased involvement in activities and contact with friends.   Suicidal/Homicidal: Nowithout intent/plan  Therapist Response: reviewed symptoms, discussed stressors, assisted patient identify and replace unhelpful thoughts about managing financial affairs with helpful thoughts, assisted patient identify her resources, continued to discuss the stages of grief and explained my experience going back and forth between the stages, began to identify patient's experience with the stages, assisted patient identify and verbalize feelings of anger, normalized feelings as part of grief process, assisted patient identify/challenge/and replace negative thoughts about self to dispel inappropriate guilt, developed plan with patient to resume social contacts by attending a virtual group  Plan: Return again in 2 weeks.  Diagnosis: Axis I: MDD, Recurrent, GAD

## 2020-06-10 ENCOUNTER — Other Ambulatory Visit (HOSPITAL_COMMUNITY): Payer: Self-pay | Admitting: Psychiatry

## 2020-06-11 NOTE — Telephone Encounter (Signed)
Call for appt

## 2020-06-17 DIAGNOSIS — M25551 Pain in right hip: Secondary | ICD-10-CM | POA: Diagnosis not present

## 2020-06-17 DIAGNOSIS — M25539 Pain in unspecified wrist: Secondary | ICD-10-CM | POA: Diagnosis not present

## 2020-06-17 DIAGNOSIS — M810 Age-related osteoporosis without current pathological fracture: Secondary | ICD-10-CM | POA: Diagnosis not present

## 2020-06-17 DIAGNOSIS — M1990 Unspecified osteoarthritis, unspecified site: Secondary | ICD-10-CM | POA: Diagnosis not present

## 2020-06-17 DIAGNOSIS — M25559 Pain in unspecified hip: Secondary | ICD-10-CM | POA: Diagnosis not present

## 2020-06-17 DIAGNOSIS — E119 Type 2 diabetes mellitus without complications: Secondary | ICD-10-CM | POA: Diagnosis not present

## 2020-06-23 ENCOUNTER — Other Ambulatory Visit: Payer: Self-pay

## 2020-06-23 ENCOUNTER — Ambulatory Visit (INDEPENDENT_AMBULATORY_CARE_PROVIDER_SITE_OTHER): Payer: Medicare Other | Admitting: Psychiatry

## 2020-06-23 DIAGNOSIS — F332 Major depressive disorder, recurrent severe without psychotic features: Secondary | ICD-10-CM | POA: Diagnosis not present

## 2020-06-23 DIAGNOSIS — F411 Generalized anxiety disorder: Secondary | ICD-10-CM

## 2020-06-23 NOTE — Progress Notes (Signed)
Virtual Visit via Video Note  I connected with [name] on 06/23/20 at 3:10 PM EDT  by a video enabled telemedicine application and verified that I am speaking with the correct person using two identifiers.  Location: Patient: Home Provider: [place] Office    I discussed the limitations of evaluation and management by telemedicine and the availability of in person appointments. The patient expressed understanding and agreed to proceed.  I provided 49 minutes of non-face-to-face time during this encounter.   [name], LCSW   THERAPIST PROGRESS NOTE    Location:  Patient - Home/Provider - [place] office   Session Time: Tuesday  06/23/2020 3:10 PM -  3:59 PM   Participation Level: Active  Behavioral Response: CasualAlertAngry, Anxious and Depressed,  Type of Therapy: Individual Therapy  Treatment Goals addressed: Begin healthy grieving process  Interventions: CBT and Supportive  Summary: Lauren [name] is a 69 y.o. female who presents who is referred for services due to stress, anxiety, and depression. She has participated in therapy intermittently for past 25 years. She reports 3 psychiatric hospitalizations with the last one occuring 4 years ago at [facility].   She reports increased stress related to husband's medical condition as he fell, broke his hip, and was placed in a nursing home.  Patient reports bleeding self for husband falling last year.  She reports having a hard time coping.  Symptoms include shame, blame, guilt, ruminating thoughts, feeling overwhelmed, depressed mood, excessive worry, and anxiety.  Patient last was seen via virtual visit about 2 weeks ago.  She continues to experience grief and loss issues regarding the death of her husband in 2020/04/14 but reports coping better.  She has resumed involvement in activities and is maintaining contact with friends.  She reports decreased feelings of  guilt regarding her actions and feelings during her husband's illness.  She continues to miss her husband especially his support in managing certain situations.  She recalls helpful sayings he used to share with her and is able to apply these in some of her situations.  She smiles today in session as she recalls both memories.  She reports stress regarding the conflict she had with a longtime friend when they went to the beach about a month ago.  She has reached out to the friend via text but the friend has not responded.  Patient reports this feels like another loss.  She also reports stress about recent comments her pastor made about patient's husband during a sermon.     Suicidal/Homicidal: Nowithout intent/plan  Therapist Response: reviewed symptoms, praised and reinforced forced patient's increased behavioral activation/involvement with friends/her efforts to use replacement thoughts to dispel inappropriate guilt, discussed effects, discussed stressors, facilitated expression of thoughts and feelings, validated feelings, assisted patient identify ways to use assertiveness skills to express her concerns to her pastor, requested patient bring 3 objects that remind her of her husband to next session,   Plan: Return again in 2 weeks.  Diagnosis: Axis I: MDD, Recurrent, GAD

## 2020-06-30 ENCOUNTER — Other Ambulatory Visit: Payer: Self-pay

## 2020-06-30 ENCOUNTER — Telehealth (INDEPENDENT_AMBULATORY_CARE_PROVIDER_SITE_OTHER): Payer: Medicare Other | Admitting: Psychiatry

## 2020-06-30 ENCOUNTER — Encounter (HOSPITAL_COMMUNITY): Payer: Self-pay | Admitting: Psychiatry

## 2020-06-30 DIAGNOSIS — F411 Generalized anxiety disorder: Secondary | ICD-10-CM | POA: Diagnosis not present

## 2020-06-30 DIAGNOSIS — F332 Major depressive disorder, recurrent severe without psychotic features: Secondary | ICD-10-CM | POA: Diagnosis not present

## 2020-06-30 MED ORDER — VENLAFAXINE HCL ER 75 MG PO CP24: 225.0000 mg | ORAL_CAPSULE | Freq: Every day | ORAL | 2 refills | Status: DC

## 2020-06-30 MED ORDER — LAMOTRIGINE 100 MG PO TABS: 100.0000 mg | ORAL_TABLET | Freq: Two times a day (BID) | ORAL | 2 refills | Status: DC

## 2020-06-30 MED ORDER — LORAZEPAM 1 MG PO TABS: 1.0000 mg | ORAL_TABLET | Freq: Three times a day (TID) | ORAL | 2 refills | Status: DC | PRN

## 2020-06-30 NOTE — Progress Notes (Signed)
Virtual Visit via Telephone Note  I connected with Lauren [name] on 06/30/20 at  2:00 PM EDT by telephone and verified that I am speaking with the correct person using two identifiers.  Location: Patient: home Provider: office   I discussed the limitations, risks, security and privacy concerns of performing an evaluation and management service by telephone and the availability of in person appointments. I also discussed with the patient that there may be a patient responsible charge related to this service. The patient expressed understanding and agreed to proceed.      I discussed the assessment and treatment plan with the patient. The patient was provided an opportunity to ask questions and all were answered. The patient agreed with the plan and demonstrated an understanding of the instructions.   The patient was advised to call back or seek an in-person evaluation if the symptoms worsen or if the condition fails to improve as anticipated.  I provided 15 minutes of non-face-to-face time during this encounter.   Levonne Spiller, MD  [facility] MD/PA/NP OP Progress Note  06/30/2020 2:20 PM Lauren [name]  MRN:  [id number]  Chief Complaint:  Chief Complaint    Depression; Anxiety; Manic Behavior; Follow-up     XNT:[id number] patient is a 69 year old widowed white female who lives alone in [place]. She has one stepchild. Her step son killed himself12 years ago.Marland Kitchen He was an alcoholic. The patient is on disability for fibromyalgia  The patient has a long history of depression. She was last hospitalized in 2010 after she took too many pain pills and got confused. She use to drink heavily but has been sober 23 years and is very active in [employer] and church. For the most part she feels her mood is stable. She sleeping pretty well. Her energy is good. She has a hard time functioning without Ativan but she only usually takes one pill a day side think this is reasonable. She tried a higher dose of  Cymbalta but it made her manic and revved up   The patient returns after long absence she was last seen about 6 months ago.  She tells me that her husband died in 2023/04/16 after he fell and broke his femur and then developed pneumonia.  She has had a rough time since then.  She is going through a lot of crying spells and ups and downs in mood.  She is seeing her therapist every 2 weeks which has been helpful and she has begun attending [place] meetings.  She has gotten vaccinated for coronavirus.  She states right now the medications continue to help her mood swings depression and anxiety.  For a while she was not sleeping but now she is sleeping too much and she thinks this is normal after losing her spouse.  She is eating fairly well.  She denies thoughts of suicide or self-harm Visit Diagnosis:    ICD-10-CM   1. Major depressive disorder, recurrent, severe without psychotic features ([place])  F33.2   2. Generalized anxiety disorder  F41.1     Past Psychiatric History: Long history of depression and mood instability.  She was last hospitalized about 6 years ago for suicidal ideation  Past Medical History:  Past Medical History:  Diagnosis Date  . Anxiety   . Chronic pain   . COPD (chronic obstructive pulmonary disease) ([place])   . Depression   . Fibromyalgia   . Fibromyalgia   . GERD (gastroesophageal reflux disease)   . Hepatitis C   .  History of blood transfusion   . History of bronchitis   . IBS (irritable bowel syndrome)   . Osteoarthritis   . Pneumonia   . Prediabetes   . Shortness of breath dyspnea    allergies; increased pain;   . Vertigo   . Wears glasses     Past Surgical History:  Procedure Laterality Date  . CATARACT EXTRACTION W/PHACO Right 08/01/2017   Procedure: CATARACT EXTRACTION PHACO AND INTRAOCULAR LENS PLACEMENT RIGHT EYE;  Surgeon: Rutherford Guys, MD;  Location: [facility];  Service: Ophthalmology;  Laterality: Right;  CDE: 3.30  . CATARACT EXTRACTION W/PHACO Left 08/15/2017    Procedure: CATARACT EXTRACTION PHACO AND INTRAOCULAR LENS PLACEMENT (IOC);  Surgeon: Rutherford Guys, MD;  Location: [facility];  Service: Ophthalmology;  Laterality: Left;  CDE: 4.00  . CHOLECYSTECTOMY    . COLONOSCOPY    . COLONOSCOPY N/A 10/23/2014   Procedure: COLONOSCOPY;  Surgeon: Rogene Houston, MD;  Location: [facility];  Service: Endoscopy;  Laterality: N/A;  1030  . ESOPHAGOGASTRODUODENOSCOPY N/A 10/23/2014   Procedure: ESOPHAGOGASTRODUODENOSCOPY (EGD);  Surgeon: Rogene Houston, MD;  Location: [facility];  Service: Endoscopy;  Laterality: N/A;  . KNEE ARTHROSCOPY Left 10/21/2015   Procedure: ARTHROSCOPY LEFT KNEE WITH MENICAL DEBRIDEMENT, Chondroplasty;  Surgeon: Gaynelle Arabian, MD;  Location: [facility];  Service: Orthopedics;  Laterality: Left;  Marland Kitchen MANDIBLE SURGERY  09/06/1979  . ORIF WRIST FRACTURE Right 07/23/2013   Procedure: OPEN REDUCTION INTERNAL FIXATION (ORIF) WRIST FRACTURE;  Surgeon: Roseanne Kaufman, MD;  Location: [facility];  Service: Orthopedics;  Laterality: Right;  . TONSILLECTOMY    . TOTAL KNEE ARTHROPLASTY     2011 rt knee  . TOTAL KNEE ARTHROPLASTY Left 04/16/2018   Procedure: LEFT TOTAL KNEE ARTHROPLASTY;  Surgeon: Gaynelle Arabian, MD;  Location: [facility];  Service: Orthopedics;  Laterality: Left;  . UPPER GASTROINTESTINAL ENDOSCOPY      Family Psychiatric History: see below  Family History:  Family History  Problem Relation Age of Onset  . Alcohol abuse Father   . Diabetes Father   . Stroke Father   . Depression Father   . Alcohol abuse Maternal Grandfather   . Alcohol abuse Maternal Grandmother   . Alcohol abuse Paternal Grandfather   . Alcohol abuse Paternal Grandmother   . Stroke Mother   . Irritable bowel syndrome Mother   . Sexual abuse Mother   . Diabetes Brother   . Healthy Brother   . Diabetes Brother   . Heart disease Brother   . Anxiety disorder Paternal Uncle   . Alcohol abuse Paternal Uncle   . Alcohol abuse Cousin   . Anxiety disorder Maternal  Uncle   . ADD / ADHD Neg Hx   . Bipolar disorder Neg Hx   . Dementia Neg Hx   . Drug abuse Neg Hx   . Paranoid behavior Neg Hx   . Schizophrenia Neg Hx   . Seizures Neg Hx   . Physical abuse Neg Hx     Social History:  Social History   Socioeconomic History  . Marital status: Married    Spouse name: Not on file  . Number of children: Not on file  . Years of education: Not on file  . Highest education level: Not on file  Occupational History  . Not on file  Tobacco Use  . Smoking status: Former Smoker    Packs/day: 2.00    Years: 15.00    Pack years: 30.00    Types: Cigarettes  Quit date: 09/05/1982    Years since quitting: 37.8  . Smokeless tobacco: Never Used  Vaping Use  . Vaping Use: Never used  Substance and Sexual Activity  . Alcohol use: No    Alcohol/week: 0.0 standard drinks    Comment: history of alcoholism; pt states has been sober 28 years  . Drug use: No  . Sexual activity: Never  Other Topics Concern  . Not on file  Social History Narrative  . Not on file   Social Determinants of Health   Financial Resource Strain:   . Difficulty of Paying Living Expenses: Not on file  Food Insecurity:   . Worried About Charity fundraiser in the Last Year: Not on file  . Ran Out of Food in the Last Year: Not on file  Transportation Needs:   . Lack of Transportation (Medical): Not on file  . Lack of Transportation (Non-Medical): Not on file  Physical Activity:   . Days of Exercise per Week: Not on file  . Minutes of Exercise per Session: Not on file  Stress:   . Feeling of Stress : Not on file  Social Connections:   . Frequency of Communication with Friends and Family: Not on file  . Frequency of Social Gatherings with Friends and Family: Not on file  . Attends Religious Services: Not on file  . Active Member of Clubs or Organizations: Not on file  . Attends [employer] Meetings: Not on file  . Marital Status: Not on file    Allergies:   Allergies  Allergen Reactions  . Elavil [Amitriptyline] Other (See Comments)    Vivid dreams and almost suicidal  . Flagyl [Metronidazole] Itching  . Vistaril [Hydroxyzine Hcl] Other (See Comments)    Got higher than a kite on too much of this.  [place] [Ziprasidone Hydrochloride] Other (See Comments)    Blacked out  . Lactose Intolerance (Gi) Other (See Comments)    GI upset  . Latex Other (See Comments)    Red at site    Metabolic Disorder Labs: Lab Results  Component Value Date   HGBA1C 6.1 (H) 10/13/2015   MPG 128 10/13/2015   No results found for: PROLACTIN No results found for: CHOL, TRIG, HDL, CHOLHDL, VLDL, LDLCALC Lab Results  Component Value Date   TSH 2.400 03/19/2014    Therapeutic Level Labs: No results found for: LITHIUM No results found for: VALPROATE No components found for:  CBMZ  Current Medications: Current Outpatient Medications  Medication Sig Dispense Refill  . albuterol (PROAIR HFA) 108 (90 BASE) MCG/ACT inhaler Inhale 2 puffs into the lungs every 4 (four) hours as needed for wheezing or shortness of breath.    . Biotin w/ Vitamins C & E (HAIR/SKIN/NAILS PO) Take 2 tablets by mouth daily.    . budesonide-formoterol (SYMBICORT) 160-4.5 MCG/ACT inhaler Inhale 2 puffs into the lungs 2 (two) times daily. (Patient not taking: Reported on 02/20/2020)    . calcium-vitamin D (OSCAL WITH D) 500-200 MG-UNIT per tablet Take 1 tablet by mouth 2 (two) times daily. For low calcium    . Coenzyme Q10 (COQ10) 100 MG CAPS Take 1 capsule daily by mouth.    . Cyanocobalamin (B-12) 2000 MCG TABS Take 2,000 mcg by mouth daily.    . diclofenac sodium (VOLTAREN) 1 % GEL Apply 2 g topically 4 (four) times daily as needed (For pain.).     Marland Kitchen dicyclomine (BENTYL) 20 MG tablet Take 1 tablet (20 mg total) by  mouth every 6 (six) hours as needed for spasms. 90 tablet 2  . diphenoxylate-atropine (LOMOTIL) 2.5-0.025 MG tablet TAKE 1 TABLET BY MOUTH 3 TIMES A DAY AS NEEDED 60 tablet  0  . doxycycline (VIBRAMYCIN) 100 MG capsule Take 100 mg by mouth 2 (two) times daily.    Marland Kitchen gabapentin (NEURONTIN) 100 MG capsule Take 200-300 mg by mouth See admin instructions. Take 200mg  by mouth every day in the morning and take 300mg  at bedtime    . HYDROcodone-homatropine (HYCODAN) 5-1.5 MG/5ML syrup Take 5 mLs by mouth every 6 (six) hours as needed for cough. (Patient not taking: Reported on 02/20/2020)    . lamoTRIgine (LAMICTAL) 100 MG tablet Take 1 tablet (100 mg total) by mouth 2 (two) times daily. 180 tablet 2  . lisinopril (ZESTRIL) 5 MG tablet Take 5 mg by mouth at bedtime. (Patient not taking: Reported on 02/20/2020)    . LORazepam (ATIVAN) 1 MG tablet Take 1 tablet (1 mg total) by mouth 3 (three) times daily as needed for anxiety. 90 tablet 2  . Misc Natural Products (OSTEO BI-FLEX JOINT SHIELD PO) Take 1 tablet by mouth daily.    . Multiple Vitamins-Minerals (MULTIVITAMIN WITH MINERALS) tablet Take 1 tablet daily by mouth.    . pantoprazole (PROTONIX) 40 MG tablet Take 40 mg by mouth 2 (two) times daily.    . pravastatin (PRAVACHOL) 80 MG tablet Take 80 mg at bedtime by mouth.     . prochlorperazine (COMPAZINE) 10 MG tablet Take 1 tablet (10 mg total) by mouth every 6 (six) hours as needed for nausea or vomiting. (Patient not taking: Reported on 02/20/2020) 30 tablet 0  . ranitidine (ZANTAC) 150 MG tablet TAKE 1 TABLET BY MOUTH TWICE A DAY 180 tablet 3  . Turmeric 450 MG CAPS Take 450 mg by mouth daily.    Marland Kitchen venlafaxine XR (EFFEXOR XR) 75 MG 24 hr capsule Take 3 capsules (225 mg total) by mouth daily with breakfast. 270 capsule 2  . vitamin E 400 UNIT capsule Take 400 Units daily by mouth.     No current facility-administered medications for this visit.     Musculoskeletal: Strength & Muscle Tone: within normal limits Gait & Station: normal Patient leans: N/A  Psychiatric Specialty Exam: Review of Systems  Psychiatric/Behavioral: Positive for dysphoric mood.    There were  no vitals taken for this visit.There is no height or weight on file to calculate BMI.  General Appearance: NA  Eye Contact:  NA  Speech:  Clear and Coherent  Volume:  Normal  Mood:  Dysphoric  Affect:  NA  Thought Process:  Goal Directed  Orientation:  Full (Time, Place, and Person)  Thought Content: Rumination   Suicidal Thoughts:  No  Homicidal Thoughts:  No  Memory:  Immediate;   Good Recent;   Good Remote;   Good  Judgement:  Good  Insight:  Good  Psychomotor Activity:  Decreased  Concentration:  Concentration: Fair and Attention Span: Fair  Recall:  Good  Fund of Knowledge: Good  Language: Good  Akathisia:  No  Handed:  Right  AIMS (if indicated): not done  Assets:  Communication Skills Desire for Improvement Resilience Social Support Talents/Skills  ADL's:  Intact  Cognition: WNL  Sleep:  Fair   Screenings: AUDIT     Admission (Discharged) from 03/18/2014 in [facility]  Alcohol Use Disorder Identification Test Final Score (AUDIT) 0    GAD-7     Counselor from  06/20/2018 in [facility]  Total GAD-7 Score 16    PHQ2-9     Counselor from 06/20/2018 in [facility]  PHQ-2 Total Score 3  PHQ-9 Total Score 14       Assessment and Plan: This patient is a 69 year old female with a history of depression anxiety and possible bipolar disorder.  Despite the loss of her husband she seems to be fairly stable.  For now she will continue Ativan 1 mg 3 times daily as needed for anxiety, Lamictal 100 mg twice daily for mood stabilization and Effexor XR 225 mg daily for depression.  She will return to see me in 3 months   Levonne Spiller, MD 06/30/2020, 2:21 PM

## 2020-07-03 ENCOUNTER — Encounter: Payer: Self-pay | Admitting: Emergency Medicine

## 2020-07-03 ENCOUNTER — Ambulatory Visit
Admission: EM | Admit: 2020-07-03 | Discharge: 2020-07-03 | Disposition: A | Discharge status: Home / Self Care | Payer: Medicare Other | Attending: Emergency Medicine | Admitting: Emergency Medicine

## 2020-07-03 ENCOUNTER — Other Ambulatory Visit: Payer: Self-pay

## 2020-07-03 ENCOUNTER — Ambulatory Visit (HOSPITAL_COMMUNITY)
Admission: RE | Admit: 2020-07-03 | Discharge: 2020-07-03 | Disposition: A | Discharge status: Home / Self Care | Payer: Medicare Other | Source: Ambulatory Visit | Attending: Diagnostic Radiology | Admitting: Diagnostic Radiology

## 2020-07-03 DIAGNOSIS — R053 Chronic cough: Secondary | ICD-10-CM | POA: Diagnosis not present

## 2020-07-03 DIAGNOSIS — J4521 Mild intermittent asthma with (acute) exacerbation: Secondary | ICD-10-CM

## 2020-07-03 DIAGNOSIS — R059 Cough, unspecified: Secondary | ICD-10-CM | POA: Diagnosis not present

## 2020-07-03 MED ORDER — PREDNISONE 10 MG (21) PO TBPK: ORAL_TABLET | Freq: Every day | ORAL | 0 refills | Status: DC

## 2020-07-03 NOTE — ED Triage Notes (Signed)
Pt has had cough x 5 months.  Had neg covid test x 3 weeks ago. Taking mucinex dm flonase and cough drops with no relief.

## 2020-07-03 NOTE — Discharge Instructions (Signed)
Chest x-ray ordered.  We will follow up with you regarding abnormal results Get plenty of rest and push fluids Prednisone prescribed.  Take as directed and to completion Continue with allergy and acid reflux medications Follow up with PCP for recheck and/or if symptoms persists Return or go to ER if you have any new or worsening symptoms such as fever, chills, fatigue, shortness of breath, wheezing, chest pain, nausea, changes in bowel or bladder habits, etc..Marland Kitchen

## 2020-07-03 NOTE — ED Provider Notes (Addendum)
[facility]   [id number] 07/03/20 Arrival Time: 2025  Cc: COUGH  SUBJECTIVE:  Lauren [name] is a 69 y.o. female who presents with dry cough x 5 months.  Denies positive sick exposure or precipitating event.  Has tried OTC medications including acid reflux medications, cold/ cough medications, allergy medication, and inhaler without relief.  Symptoms are made worse at night.  Denies previous symptoms in the past.   Complains of PND, acid reflux, and asthma.  Denies fever, chills, SOB, wheezing, chest pain, nausea, changes in bowel or bladder habits.    Reports hx of asthma  ROS: As per HPI.  All other pertinent ROS negative.     Past Medical History:  Diagnosis Date  . Anxiety   . Chronic pain   . COPD (chronic obstructive pulmonary disease) ([place])   . Depression   . Fibromyalgia   . Fibromyalgia   . GERD (gastroesophageal reflux disease)   . Hepatitis C   . History of blood transfusion   . History of bronchitis   . IBS (irritable bowel syndrome)   . Osteoarthritis   . Pneumonia   . Prediabetes   . Shortness of breath dyspnea    allergies; increased pain;   . Vertigo   . Wears glasses    Past Surgical History:  Procedure Laterality Date  . CATARACT EXTRACTION W/PHACO Right 08/01/2017   Procedure: CATARACT EXTRACTION PHACO AND INTRAOCULAR LENS PLACEMENT RIGHT EYE;  Surgeon: Rutherford Guys, MD;  Location: [facility];  Service: Ophthalmology;  Laterality: Right;  CDE: 3.30  . CATARACT EXTRACTION W/PHACO Left 08/15/2017   Procedure: CATARACT EXTRACTION PHACO AND INTRAOCULAR LENS PLACEMENT (IOC);  Surgeon: Rutherford Guys, MD;  Location: [facility];  Service: Ophthalmology;  Laterality: Left;  CDE: 4.00  . CHOLECYSTECTOMY    . COLONOSCOPY    . COLONOSCOPY N/A 10/23/2014   Procedure: COLONOSCOPY;  Surgeon: Rogene Houston, MD;  Location: [facility];  Service: Endoscopy;  Laterality: N/A;  1030  . ESOPHAGOGASTRODUODENOSCOPY N/A 10/23/2014   Procedure:  ESOPHAGOGASTRODUODENOSCOPY (EGD);  Surgeon: Rogene Houston, MD;  Location: [facility];  Service: Endoscopy;  Laterality: N/A;  . KNEE ARTHROSCOPY Left 10/21/2015   Procedure: ARTHROSCOPY LEFT KNEE WITH MENICAL DEBRIDEMENT, Chondroplasty;  Surgeon: Gaynelle Arabian, MD;  Location: [facility];  Service: Orthopedics;  Laterality: Left;  Marland Kitchen MANDIBLE SURGERY  09/06/1979  . ORIF WRIST FRACTURE Right 07/23/2013   Procedure: OPEN REDUCTION INTERNAL FIXATION (ORIF) WRIST FRACTURE;  Surgeon: Roseanne Kaufman, MD;  Location: [facility];  Service: Orthopedics;  Laterality: Right;  . TONSILLECTOMY    . TOTAL KNEE ARTHROPLASTY     2011 rt knee  . TOTAL KNEE ARTHROPLASTY Left 04/16/2018   Procedure: LEFT TOTAL KNEE ARTHROPLASTY;  Surgeon: Gaynelle Arabian, MD;  Location: [facility];  Service: Orthopedics;  Laterality: Left;  . UPPER GASTROINTESTINAL ENDOSCOPY     Allergies  Allergen Reactions  . Elavil [Amitriptyline] Other (See Comments)    Vivid dreams and almost suicidal  . Flagyl [Metronidazole] Itching  . Vistaril [Hydroxyzine Hcl] Other (See Comments)    Got higher than a kite on too much of this.  [place] Royal [Ziprasidone Hydrochloride] Other (See Comments)    Blacked out  . Lactose Intolerance (Gi) Other (See Comments)    GI upset  . Latex Other (See Comments)    Red at site   No current facility-administered medications on file prior to encounter.   Current Outpatient Medications on File Prior to Encounter  Medication Sig Dispense  Refill  . albuterol (PROAIR HFA) 108 (90 BASE) MCG/ACT inhaler Inhale 2 puffs into the lungs every 4 (four) hours as needed for wheezing or shortness of breath.    . Biotin w/ Vitamins C & E (HAIR/SKIN/NAILS PO) Take 2 tablets by mouth daily.    . budesonide-formoterol (SYMBICORT) 160-4.5 MCG/ACT inhaler Inhale 2 puffs into the lungs 2 (two) times daily. (Patient not taking: Reported on 02/20/2020)    . calcium-vitamin D (OSCAL WITH D) 500-200 MG-UNIT per tablet Take 1 tablet by mouth 2  (two) times daily. For low calcium    . Coenzyme Q10 (COQ10) 100 MG CAPS Take 1 capsule daily by mouth.    . Cyanocobalamin (B-12) 2000 MCG TABS Take 2,000 mcg by mouth daily.    . diclofenac sodium (VOLTAREN) 1 % GEL Apply 2 g topically 4 (four) times daily as needed (For pain.).     Marland Kitchen dicyclomine (BENTYL) 20 MG tablet Take 1 tablet (20 mg total) by mouth every 6 (six) hours as needed for spasms. 90 tablet 2  . diphenoxylate-atropine (LOMOTIL) 2.5-0.025 MG tablet TAKE 1 TABLET BY MOUTH 3 TIMES A DAY AS NEEDED 60 tablet 0  . doxycycline (VIBRAMYCIN) 100 MG capsule Take 100 mg by mouth 2 (two) times daily.    Marland Kitchen gabapentin (NEURONTIN) 100 MG capsule Take 200-300 mg by mouth See admin instructions. Take 200mg  by mouth every day in the morning and take 300mg  at bedtime    . HYDROcodone-homatropine (HYCODAN) 5-1.5 MG/5ML syrup Take 5 mLs by mouth every 6 (six) hours as needed for cough. (Patient not taking: Reported on 02/20/2020)    . lamoTRIgine (LAMICTAL) 100 MG tablet Take 1 tablet (100 mg total) by mouth 2 (two) times daily. 180 tablet 2  . lisinopril (ZESTRIL) 5 MG tablet Take 5 mg by mouth at bedtime. (Patient not taking: Reported on 02/20/2020)    . LORazepam (ATIVAN) 1 MG tablet Take 1 tablet (1 mg total) by mouth 3 (three) times daily as needed for anxiety. 90 tablet 2  . Misc Natural Products (OSTEO BI-FLEX JOINT SHIELD PO) Take 1 tablet by mouth daily.    . Multiple Vitamins-Minerals (MULTIVITAMIN WITH MINERALS) tablet Take 1 tablet daily by mouth.    . pantoprazole (PROTONIX) 40 MG tablet Take 40 mg by mouth 2 (two) times daily.    . pravastatin (PRAVACHOL) 80 MG tablet Take 80 mg at bedtime by mouth.     . prochlorperazine (COMPAZINE) 10 MG tablet Take 1 tablet (10 mg total) by mouth every 6 (six) hours as needed for nausea or vomiting. (Patient not taking: Reported on 02/20/2020) 30 tablet 0  . ranitidine (ZANTAC) 150 MG tablet TAKE 1 TABLET BY MOUTH TWICE A DAY 180 tablet 3  . Turmeric 450  MG CAPS Take 450 mg by mouth daily.    Marland Kitchen venlafaxine XR (EFFEXOR XR) 75 MG 24 hr capsule Take 3 capsules (225 mg total) by mouth daily with breakfast. 270 capsule 2  . vitamin E 400 UNIT capsule Take 400 Units daily by mouth.      Social History   Socioeconomic History  . Marital status: Married    Spouse name: Not on file  . Number of children: Not on file  . Years of education: Not on file  . Highest education level: Not on file  Occupational History  . Not on file  Tobacco Use  . Smoking status: Former Smoker    Packs/day: 2.00    Years: 15.00  Pack years: 30.00    Types: Cigarettes    Quit date: 09/05/1982    Years since quitting: 37.8  . Smokeless tobacco: Never Used  Vaping Use  . Vaping Use: Never used  Substance and Sexual Activity  . Alcohol use: No    Alcohol/week: 0.0 standard drinks    Comment: history of alcoholism; pt states has been sober 28 years  . Drug use: No  . Sexual activity: Never  Other Topics Concern  . Not on file  Social History Narrative  . Not on file   Social Determinants of Health   Financial Resource Strain:   . Difficulty of Paying Living Expenses: Not on file  Food Insecurity:   . Worried About Charity fundraiser in the Last Year: Not on file  . Ran Out of Food in the Last Year: Not on file  Transportation Needs:   . Lack of Transportation (Medical): Not on file  . Lack of Transportation (Non-Medical): Not on file  Physical Activity:   . Days of Exercise per Week: Not on file  . Minutes of Exercise per Session: Not on file  Stress:   . Feeling of Stress : Not on file  Social Connections:   . Frequency of Communication with Friends and Family: Not on file  . Frequency of Social Gatherings with Friends and Family: Not on file  . Attends Religious Services: Not on file  . Active Member of Clubs or Organizations: Not on file  . Attends Archivist Meetings: Not on file  . Marital Status: Not on file  Intimate Partner  Violence:   . Fear of Current or Ex-Partner: Not on file  . Emotionally Abused: Not on file  . Physically Abused: Not on file  . Sexually Abused: Not on file   Family History  Problem Relation Age of Onset  . Alcohol abuse Father   . Diabetes Father   . Stroke Father   . Depression Father   . Alcohol abuse Maternal Grandfather   . Alcohol abuse Maternal Grandmother   . Alcohol abuse Paternal Grandfather   . Alcohol abuse Paternal Grandmother   . Stroke Mother   . Irritable bowel syndrome Mother   . Sexual abuse Mother   . Diabetes Brother   . Healthy Brother   . Diabetes Brother   . Heart disease Brother   . Anxiety disorder Paternal Uncle   . Alcohol abuse Paternal Uncle   . Alcohol abuse Cousin   . Anxiety disorder Maternal Uncle   . ADD / ADHD Neg Hx   . Bipolar disorder Neg Hx   . Dementia Neg Hx   . Drug abuse Neg Hx   . Paranoid behavior Neg Hx   . Schizophrenia Neg Hx   . Seizures Neg Hx   . Physical abuse Neg Hx      OBJECTIVE:  Vitals:   07/03/20 1726  BP: (!) 150/88  Pulse: 77  Resp: 18  Temp: 98.1 F (36.7 C)  TempSrc: Oral  SpO2: 95%  Weight: 149 lb 14.6 oz (68 kg)  Height: 5\' 6"  (1.676 m)    General appearance: alert; mildly fatigued appearing, nontoxic; speaking in full sentences and tolerating own secretions HEENT: NCAT; Ears: EACs clear, TMs pearly gray; Eyes: PERRL.  EOM grossly intact.Nose: nares patent without rhinorrhea, Throat: oropharynx clear, tonsils non erythematous or enlarged, uvula midline  Neck: supple without LAD Lungs: unlabored respirations, symmetrical air entry; cough: mild; no respiratory distress; CTAB Heart: regular  rate and rhythm.  Skin: warm and dry Psychological: alert and cooperative; normal mood and affect   DIAGNOSTIC STUDIES:  DG Chest 2 View  Result Date: 07/03/2020 CLINICAL DATA:  Chronic cough EXAM: CHEST - 2 VIEW COMPARISON:  11/28/2017 FINDINGS: Mild bronchitic changes. No focal consolidation or  effusion. Normal cardiac size. Aortic atherosclerosis. No pneumothorax. IMPRESSION: Mild bronchitic changes. No focal pulmonary infiltrate. Electronically Signed   By: Donavan Foil M.D.   On: 07/03/2020 18:43    X-rays showed bronchitis changes  I have reviewed the x-rays myself and the radiologist interpretation. I am in agreement with the radiologist interpretation.     ASSESSMENT & PLAN:  1. Chronic cough   2. Mild intermittent asthma with exacerbation     Meds ordered this encounter  Medications  . predniSONE (STERAPRED UNI-PAK 21 TAB) 10 MG (21) TBPK tablet    Sig: Take by mouth daily. Take 6 tabs by mouth daily  for 2 days, then 5 tabs for 2 days, then 4 tabs for 2 days, then 3 tabs for 2 days, 2 tabs for 2 days, then 1 tab by mouth daily for 2 days    Dispense:  42 tablet    Refill:  0    Order Specific Question:   Supervising Provider    Answer:   Raylene Everts [[id number]]    Orders Placed This Encounter  Procedures  . DG Chest 2 View    Standing Status:   Future    Number of Occurrences:   1    Standing Expiration Date:   07/03/2021    Order Specific Question:   Reason for Exam (SYMPTOM  OR DIAGNOSIS REQUIRED)    Answer:   chronic cough x 5 months    Order Specific Question:   Preferred imaging location?    Answer:   [facility]    Chest x-ray ordered.  We will follow up with you regarding abnormal results Get plenty of rest and push fluids Prednisone prescribed.  Take as directed and to completion Continue with allergy and acid reflux medications Follow up with PCP for recheck and/or if symptoms persists Return or go to ER if you have any new or worsening symptoms such as fever, chills, fatigue, shortness of breath, wheezing, chest pain, nausea, changes in bowel or bladder habits, etc...  Reviewed expectations re: course of current medical issues. Questions answered. Outlined signs and symptoms indicating need for more acute intervention. Patient  verbalized understanding. After Visit Summary given.   Addendum:  X-rays showed bronchitic changes.  Called patient to inform of results.  No answer at this time.           Lestine Box, PA-C 07/03/20 [place], [place], PA-C 07/03/20 1915

## 2020-07-04 DIAGNOSIS — G894 Chronic pain syndrome: Secondary | ICD-10-CM | POA: Diagnosis not present

## 2020-07-04 DIAGNOSIS — E7849 Other hyperlipidemia: Secondary | ICD-10-CM | POA: Diagnosis not present

## 2020-07-04 DIAGNOSIS — I10 Essential (primary) hypertension: Secondary | ICD-10-CM | POA: Diagnosis not present

## 2020-07-07 ENCOUNTER — Ambulatory Visit (HOSPITAL_COMMUNITY): Payer: Medicare Other | Admitting: Psychiatry

## 2020-07-07 DIAGNOSIS — M810 Age-related osteoporosis without current pathological fracture: Secondary | ICD-10-CM | POA: Diagnosis not present

## 2020-07-07 DIAGNOSIS — E119 Type 2 diabetes mellitus without complications: Secondary | ICD-10-CM | POA: Diagnosis not present

## 2020-07-07 DIAGNOSIS — M25559 Pain in unspecified hip: Secondary | ICD-10-CM | POA: Diagnosis not present

## 2020-07-07 DIAGNOSIS — M1990 Unspecified osteoarthritis, unspecified site: Secondary | ICD-10-CM | POA: Diagnosis not present

## 2020-07-15 DIAGNOSIS — Z0000 Encounter for general adult medical examination without abnormal findings: Secondary | ICD-10-CM | POA: Diagnosis not present

## 2020-07-15 DIAGNOSIS — R7989 Other specified abnormal findings of blood chemistry: Secondary | ICD-10-CM | POA: Diagnosis not present

## 2020-07-15 DIAGNOSIS — E785 Hyperlipidemia, unspecified: Secondary | ICD-10-CM | POA: Diagnosis not present

## 2020-07-15 DIAGNOSIS — R7303 Prediabetes: Secondary | ICD-10-CM | POA: Diagnosis not present

## 2020-07-20 ENCOUNTER — Ambulatory Visit (INDEPENDENT_AMBULATORY_CARE_PROVIDER_SITE_OTHER): Payer: Medicare Other | Admitting: Psychiatry

## 2020-07-20 ENCOUNTER — Other Ambulatory Visit: Payer: Self-pay

## 2020-07-20 DIAGNOSIS — F332 Major depressive disorder, recurrent severe without psychotic features: Secondary | ICD-10-CM | POA: Diagnosis not present

## 2020-07-20 DIAGNOSIS — F411 Generalized anxiety disorder: Secondary | ICD-10-CM | POA: Diagnosis not present

## 2020-07-20 NOTE — Progress Notes (Signed)
Virtual Visit via Telephone Note  I connected with [name] on 07/20/20 at 1:11 PM EST  by telephone and verified that I am speaking with the correct person using two identifiers.  Location: Patient: Home Provider: [facility] office    I discussed the limitations, risks, security and privacy concerns of performing an evaluation and management service by telephone and the availability of in person appointments. I also discussed with the patient that there may be a patient responsible charge related to this service. The patient expressed understanding and agreed to proceed.   I provided 43 minutes of non-face-to-face time during this encounter.   [name], LCSW   THERAPIST PROGRESS NOTE    Location:  Patient - Home/Provider - [facility] office   Session Time: Monday 07/20/2020 1:11 PM - 1:54 PM   Participation Level: Active  Behavioral Response: CasualAlertAngry, Anxious and Depressed,  Type of Therapy: Individual Therapy  Treatment Goals addressed: Begin healthy grieving process  Interventions: CBT and Supportive  Summary: Lauren [name] is a 69 y.o. female who presents who is referred for services due to stress, anxiety, and depression. She has participated in therapy intermittently for past 25 years. She reports 3 psychiatric hospitalizations with the last one occuring 4 years ago at [facility].   She reports increased stress related to husband's medical condition as he fell, broke his hip, and was placed in a nursing home.  Patient reports bleeding self for husband falling last year.  She reports having a hard time coping.  Symptoms include shame, blame, guilt, ruminating thoughts, feeling overwhelmed, depressed mood, excessive worry, and anxiety.  Patient last was seen via virtual visit about 4  weeks ago.  She continues to experience grief and loss issues regarding the death of her husband in 02-Apr-2020.  She states  having good days and bad days.  She still has thoughts that invoke inappropriate guilt but recognizes these thoughts and uses replacement thoughts.  She is trying to maintain involvement in activities and contact with friends.  She reports increased loneliness but is trying to reach out to others.  She reports going back and forth to the stages of grief.  She expresses anxiety about coping with the upcoming holiday.  Suicidal/Homicidal: Nowithout intent/plan  Therapist Response: reviewed symptoms, praised and reinforced forced patient's continued behavioral activation/involvement with friends/her efforts to use replacement thoughts to dispel inappropriate guilt, normalized her experience with the stages of grief, provided psychoeducation on the grief process especially integrated grief, normalized grief and loss issues triggered by the holidays, assisted patient identify ways to cope with grief during the holidays, also will send patient a handout via mail, requested patient bring 3 objects that remind her of her husband to next session,   Plan: Return again in 2 weeks.  Diagnosis: Axis I: MDD, Recurrent, GAD

## 2020-08-04 DIAGNOSIS — H524 Presbyopia: Secondary | ICD-10-CM | POA: Diagnosis not present

## 2020-08-04 DIAGNOSIS — Z961 Presence of intraocular lens: Secondary | ICD-10-CM | POA: Diagnosis not present

## 2020-08-04 DIAGNOSIS — E7849 Other hyperlipidemia: Secondary | ICD-10-CM | POA: Diagnosis not present

## 2020-08-04 DIAGNOSIS — H52203 Unspecified astigmatism, bilateral: Secondary | ICD-10-CM | POA: Diagnosis not present

## 2020-08-04 DIAGNOSIS — G894 Chronic pain syndrome: Secondary | ICD-10-CM | POA: Diagnosis not present

## 2020-08-04 DIAGNOSIS — E119 Type 2 diabetes mellitus without complications: Secondary | ICD-10-CM | POA: Diagnosis not present

## 2020-08-04 DIAGNOSIS — I10 Essential (primary) hypertension: Secondary | ICD-10-CM | POA: Diagnosis not present

## 2020-08-04 DIAGNOSIS — H04123 Dry eye syndrome of bilateral lacrimal glands: Secondary | ICD-10-CM | POA: Diagnosis not present

## 2020-08-05 ENCOUNTER — Ambulatory Visit (INDEPENDENT_AMBULATORY_CARE_PROVIDER_SITE_OTHER): Payer: Medicare Other | Admitting: Psychiatry

## 2020-08-05 ENCOUNTER — Other Ambulatory Visit: Payer: Self-pay

## 2020-08-05 DIAGNOSIS — F332 Major depressive disorder, recurrent severe without psychotic features: Secondary | ICD-10-CM

## 2020-08-05 NOTE — Progress Notes (Signed)
Virtual Visit via Telephone Note  I connected with Lauren [name] on 08/05/20 at 4:05 PM EST by telephone and verified that I am speaking with the correct person using two identifiers.  Location: Patient: Home Provider: [place] office    I discussed the limitations, risks, security and privacy concerns of performing an evaluation and management service by telephone and the availability of in person appointments. I also discussed with the patient that there may be a patient responsible charge related to this service. The patient expressed understanding and agreed to proceed.    I provided 49 minutes of non-face-to-face time during this encounter.   Lauren Smoker, LCSW THERAPIST PROGRESS NOTE    Location:  Patient - Home/Provider - [facility] office   Session Time: Monday 08/05/2020 4:05 PM - 4:54 PM           Participation Level: Active  Behavioral Response: CasualAlertAngry, Anxious and Depressed,  Type of Therapy: Individual Therapy  Treatment Goals addressed: Begin healthy grieving process  Interventions: CBT and Supportive  Summary: Lauren [name] is a 69 y.o. female who presents who is referred for services due to stress, anxiety, and depression. She has participated in therapy intermittently for past 25 years. She reports 3 psychiatric hospitalizations with the last one occuring 4 years ago at [facility].   She reports increased stress related to husband's medical condition as he fell, broke his hip, and was placed in a nursing home.  Patient reports bleeding self for husband falling last year.  She reports having a hard time coping.  Symptoms include shame, blame, guilt, ruminating thoughts, feeling overwhelmed, depressed mood, excessive worry, and anxiety.  Patient last was seen via virtual visit about 4 weeks ago.  She continues to experience grief and loss issues regarding the death of her husband in 04/13/2020.  She  states having good days and bad days.  She reports managing Thanksgiving much better than she thought she would.  She socialized with family and friends as well as shared memories about her husband.  She continues to experience inappropriate guilt at times and has noticed this along with depressed mood tend to occur more when she is experiencing increased physical issues related to her diagnosis of fibromyalgia.   Suicidal/Homicidal: Nowithout intent/plan  Therapist Response: reviewed symptoms, praised and reinforced forced patient's continued behavioral activation/involvement with friends/her efforts to use replacement thoughts to dispel inappropriate guilt, normalized her experience with the stages of grief, facilitated patient sharing thoughts/feelings/memories regarding objects that remind her of her husband to help patient balance the pain of her loss with pleasurable memories    Plan: Return again in 2 weeks.  Diagnosis: Axis I: MDD, Recurrent, GAD

## 2020-08-06 ENCOUNTER — Other Ambulatory Visit (INDEPENDENT_AMBULATORY_CARE_PROVIDER_SITE_OTHER): Payer: Self-pay

## 2020-08-06 ENCOUNTER — Other Ambulatory Visit: Payer: Self-pay

## 2020-08-06 ENCOUNTER — Encounter (INDEPENDENT_AMBULATORY_CARE_PROVIDER_SITE_OTHER): Payer: Self-pay

## 2020-08-06 ENCOUNTER — Ambulatory Visit (INDEPENDENT_AMBULATORY_CARE_PROVIDER_SITE_OTHER): Payer: Medicare Other | Admitting: Gastroenterology

## 2020-08-06 ENCOUNTER — Encounter (INDEPENDENT_AMBULATORY_CARE_PROVIDER_SITE_OTHER): Payer: Self-pay | Admitting: Gastroenterology

## 2020-08-06 VITALS — BP 119/74 | HR 103 | Temp 98.2°F | Ht 65.0 in | Wt 157.0 lb

## 2020-08-06 DIAGNOSIS — K219 Gastro-esophageal reflux disease without esophagitis: Secondary | ICD-10-CM | POA: Diagnosis not present

## 2020-08-06 DIAGNOSIS — R197 Diarrhea, unspecified: Secondary | ICD-10-CM | POA: Diagnosis not present

## 2020-08-06 DIAGNOSIS — K582 Mixed irritable bowel syndrome: Secondary | ICD-10-CM | POA: Diagnosis not present

## 2020-08-06 DIAGNOSIS — R1310 Dysphagia, unspecified: Secondary | ICD-10-CM | POA: Insufficient documentation

## 2020-08-06 DIAGNOSIS — R1319 Other dysphagia: Secondary | ICD-10-CM | POA: Diagnosis not present

## 2020-08-06 MED ORDER — DEXILANT 60 MG PO CPDR: 60.0000 mg | DELAYED_RELEASE_CAPSULE | Freq: Every day | ORAL | 3 refills | Status: DC

## 2020-08-06 MED ORDER — DIPHENOXYLATE-ATROPINE 2.5-0.025 MG PO TABS: 1.0000 | ORAL_TABLET | Freq: Three times a day (TID) | ORAL | 6 refills | Status: DC | PRN

## 2020-08-06 MED ORDER — NORTRIPTYLINE HCL 10 MG PO CAPS: 10.0000 mg | ORAL_CAPSULE | Freq: Every day | ORAL | 2 refills | Status: DC

## 2020-08-06 NOTE — Progress Notes (Signed)
[name], M.D. Gastroenterology & Hepatology [facility] [address] [place], [place] [address] Primary Care Physician: Redmond School, MD [address] [place] [address]  Referring MD: PCP  I will communicate my assessment and recommendations to the referring MD via EMR. Note: Occasional unusual wording and randomly placed punctuation marks may result from the use of speech recognition technology to transcribe this document"  Chief Complaint: Recurrent heartburn, dysphagia and diarrhea  History of Present Illness: Lauren [name] is a 68 y.o. female with PMH hepatitis C 1a s/p Harvoni, IBS-D, who presents for evaluation of recurrent heartburn, dysphagia and diarrhea.  Patient reports that she has presented chronic symptoms for multiple years, which have been happening on and off. She reports that she has been on multiple medications for heartburn for multiple years.  She states that initially he had good control of her symptoms with ranitidine but these was taken out of the market. She has been on Prilosec and Nexium which have provided some relief but not complete resolution of her symptoms. She is currently taking pantoprazole 80 mg BID and famotidine 40 mg at night. She reports that she is still having recurrent episodes of heartburn and regurgitation on a daily basis even though she is taking the medications compliantly.  She was prescribed pantoprazole 40 mg twice daily but she increased her dose to  80 mg daily as it was not helping her.  Patient states that she has worsening of her symptoms when eating spicy food.   Patient reports as well episodes of dysphagia to solids mostly but occasionally with liquids.  Denies having any odynophagia.  She states of the food takes wait longer to go down that it used to in the past.  Endoscopic mucosal resection note, she reports a longstanding history of intermittent episodes  of diarrhea.  Patient reports that she takes Imodium 2 tabs per day for management of multiple episodes of soft bowel movements which she describes as diarrhea but they have small amount in size- statres she has multiple BMs per day.  Patient states that her husband passed away in Apr 11, 2020.  She relates that he had a complicated course for the last 3 years after he was diagnosed with bladder cancer which put significant stress on her.  The patient denies having any nausea, vomiting, fever, chills, hematochezia, melena, hematemesis, jaundice, pruritus or weight loss.  Last EGD:2016 - Serrated gastroesophageal junction with focal erythema consistent with mild changes of reflux esophagitis. Biopsy taken to rule out short segment Barrett's. Pathology positive for BE. No evidence of peptic ulcer disease or pyloric stenosis.  Last Colonoscopy: 2016 - Prep satisfactory. Normal mucosa of cecum, ascending colon, hepatic flexure, transverse colon, splenic flexure, descending and sigmoid colon. Normal rectal mucosa. Small hemorrhoids below the dentate line.  Past Medical History: Past Medical History:  Diagnosis Date  . Anxiety   . Chronic pain   . COPD (chronic obstructive pulmonary disease) ([place])   . Depression   . Fibromyalgia   . Fibromyalgia   . GERD (gastroesophageal reflux disease)   . Hepatitis C   . History of blood transfusion   . History of bronchitis   . IBS (irritable bowel syndrome)   . Osteoarthritis   . Pneumonia   . Prediabetes   . Shortness of breath dyspnea    allergies; increased pain;   . Vertigo   . Wears glasses     Past Surgical History: Past Surgical History:  Procedure Laterality Date  . CATARACT EXTRACTION W/PHACO Right 08/01/2017   Procedure: CATARACT EXTRACTION PHACO AND INTRAOCULAR LENS PLACEMENT RIGHT EYE;  Surgeon: Rutherford Guys, MD;  Location: [facility];  Service: Ophthalmology;  Laterality: Right;  CDE: 3.30  . CATARACT EXTRACTION W/PHACO Left 08/15/2017    Procedure: CATARACT EXTRACTION PHACO AND INTRAOCULAR LENS PLACEMENT (IOC);  Surgeon: Rutherford Guys, MD;  Location: [facility];  Service: Ophthalmology;  Laterality: Left;  CDE: 4.00  . CHOLECYSTECTOMY    . COLONOSCOPY    . COLONOSCOPY N/A 10/23/2014   Procedure: COLONOSCOPY;  Surgeon: Rogene Houston, MD;  Location: [facility];  Service: Endoscopy;  Laterality: N/A;  1030  . ESOPHAGOGASTRODUODENOSCOPY N/A 10/23/2014   Procedure: ESOPHAGOGASTRODUODENOSCOPY (EGD);  Surgeon: Rogene Houston, MD;  Location: [facility];  Service: Endoscopy;  Laterality: N/A;  . KNEE ARTHROSCOPY Left 10/21/2015   Procedure: ARTHROSCOPY LEFT KNEE WITH MENICAL DEBRIDEMENT, Chondroplasty;  Surgeon: Gaynelle Arabian, MD;  Location: [facility];  Service: Orthopedics;  Laterality: Left;  Marland Kitchen MANDIBLE SURGERY  09/06/1979  . ORIF WRIST FRACTURE Right 07/23/2013   Procedure: OPEN REDUCTION INTERNAL FIXATION (ORIF) WRIST FRACTURE;  Surgeon: Roseanne Kaufman, MD;  Location: [place];  Service: Orthopedics;  Laterality: Right;  . TONSILLECTOMY    . TOTAL KNEE ARTHROPLASTY     2011 rt knee  . TOTAL KNEE ARTHROPLASTY Left 04/16/2018   Procedure: LEFT TOTAL KNEE ARTHROPLASTY;  Surgeon: Gaynelle Arabian, MD;  Location: [facility];  Service: Orthopedics;  Laterality: Left;  . UPPER GASTROINTESTINAL ENDOSCOPY      Family History: Family History  Problem Relation Age of Onset  . Alcohol abuse Father   . Diabetes Father   . Stroke Father   . Depression Father   . Alcohol abuse Maternal Grandfather   . Alcohol abuse Maternal Grandmother   . Alcohol abuse Paternal Grandfather   . Alcohol abuse Paternal Grandmother   . Stroke Mother   . Irritable bowel syndrome Mother   . Sexual abuse Mother   . Diabetes Brother   . Healthy Brother   . Diabetes Brother   . Heart disease Brother   . Anxiety disorder Paternal Uncle   . Alcohol abuse Paternal Uncle   . Alcohol abuse Cousin   . Anxiety disorder Maternal Uncle   . ADD / ADHD Neg Hx   . Bipolar  disorder Neg Hx   . Dementia Neg Hx   . Drug abuse Neg Hx   . Paranoid behavior Neg Hx   . Schizophrenia Neg Hx   . Seizures Neg Hx   . Physical abuse Neg Hx     Social History: Social History   Tobacco Use  Smoking Status Former Smoker  . Packs/day: 2.00  . Years: 15.00  . Pack years: 30.00  . Types: Cigarettes  . Quit date: 09/05/1982  . Years since quitting: 37.9  Smokeless Tobacco Never Used   Social History   Substance and Sexual Activity  Alcohol Use No  . Alcohol/week: 0.0 standard drinks   Comment: history of alcoholism; pt states has been sober 28 years   Social History   Substance and Sexual Activity  Drug Use No    Allergies: Allergies  Allergen Reactions  . Elavil [Amitriptyline] Other (See Comments)    Vivid dreams and almost suicidal  . Flagyl [Metronidazole] Itching  . Vistaril [Hydroxyzine Hcl] Other (See Comments)    Got higher than a kite on too much of this.  [place] Royal [Ziprasidone Hydrochloride] Other (See Comments)  Blacked out  . Lactose Intolerance (Gi) Other (See Comments)    GI upset  . Latex Other (See Comments)    Red at site    Medications: Current Outpatient Medications  Medication Sig Dispense Refill  . albuterol (PROAIR HFA) 108 (90 BASE) MCG/ACT inhaler Inhale 2 puffs into the lungs every 4 (four) hours as needed for wheezing or shortness of breath.    Marland Kitchen aspirin EC 81 MG tablet Take 81 mg by mouth daily. Swallow whole.    . Biotin w/ Vitamins C & E (HAIR/SKIN/NAILS PO) Take 2 tablets by mouth daily.    . calcium-vitamin D (OSCAL WITH D) 500-200 MG-UNIT per tablet Take 1 tablet by mouth 2 (two) times daily. For low calcium    . Cyanocobalamin (B-12) 2000 MCG TABS Take 2,000 mcg by mouth daily.    . diclofenac sodium (VOLTAREN) 1 % GEL Apply 2 g topically 4 (four) times daily as needed (For pain.).     Marland Kitchen dicyclomine (BENTYL) 20 MG tablet Take 1 tablet (20 mg total) by mouth every 6 (six) hours as needed for spasms. 90 tablet 2   . diphenoxylate-atropine (LOMOTIL) 2.5-0.025 MG tablet TAKE 1 TABLET BY MOUTH 3 TIMES A DAY AS NEEDED 60 tablet 0  . gabapentin (NEURONTIN) 100 MG capsule Take 200-300 mg by mouth See admin instructions. Take 200mg  by mouth every day in the morning and take 300mg  at bedtime    . lamoTRIgine (LAMICTAL) 100 MG tablet Take 1 tablet (100 mg total) by mouth 2 (two) times daily. 180 tablet 2  . lisinopril (ZESTRIL) 5 MG tablet Take 5 mg by mouth at bedtime.     Marland Kitchen LORazepam (ATIVAN) 1 MG tablet Take 1 tablet (1 mg total) by mouth 3 (three) times daily as needed for anxiety. 90 tablet 2  . Multiple Vitamins-Minerals (MULTIVITAMIN WITH MINERALS) tablet Take 1 tablet daily by mouth.    . Omega-3 Fatty Acids (FISH OIL) 1000 MG CPDR Take 1 capsule by mouth daily.    Marland Kitchen OVER THE COUNTER MEDICATION Calcium, Magnesium, and Zinc once per day.    Marland Kitchen OVER THE COUNTER MEDICATION Garlic Extract 8119 mg once per day.    Marland Kitchen OVER THE COUNTER MEDICATION Vitamin D3 25 mcg (1000 IU) once per day.    . pantoprazole (PROTONIX) 40 MG tablet Take 40 mg by mouth 2 (two) times daily.    . pravastatin (PRAVACHOL) 80 MG tablet Take 80 mg at bedtime by mouth.     . Turmeric 450 MG CAPS Take 450 mg by mouth daily.    Marland Kitchen venlafaxine XR (EFFEXOR XR) 75 MG 24 hr capsule Take 3 capsules (225 mg total) by mouth daily with breakfast. 270 capsule 2  . vitamin E 400 UNIT capsule Take 400 Units daily by mouth.    . prochlorperazine (COMPAZINE) 10 MG tablet Take 1 tablet (10 mg total) by mouth every 6 (six) hours as needed for nausea or vomiting. (Patient not taking: Reported on 08/06/2020) 30 tablet 0   No current facility-administered medications for this visit.    Review of Systems: GENERAL: negative for malaise, night sweats HEENT: No changes in hearing or vision, no nose bleeds or other nasal problems. NECK: Negative for lumps, goiter, pain and significant neck swelling RESPIRATORY: Negative for cough, wheezing CARDIOVASCULAR:  Negative for chest pain, leg swelling, palpitations, orthopnea GI: SEE HPI MUSCULOSKELETAL: Negative for joint pain or swelling, back pain, and muscle pain. SKIN: Negative for lesions, rash PSYCH: Negative for sleep disturbance,  mood disorder and recent psychosocial stressors. HEMATOLOGY Negative for prolonged bleeding, bruising easily, and swollen nodes. ENDOCRINE: Negative for cold or heat intolerance, polyuria, polydipsia and goiter. NEURO: negative for tremor, gait imbalance, syncope and seizures. The remainder of the review of systems is noncontributory.   Physical Exam: BP 119/74 (BP Location: Left Arm, Patient Position: Sitting, Cuff Size: Large)   Pulse (!) 103   Temp 98.2 F (36.8 C) (Oral)   Ht 5\' 5"  (1.651 m)   Wt 157 lb (71.2 kg)   BMI 26.13 kg/m  GENERAL: The patient is AO x3, in no acute distress. HEENT: Head is normocephalic and atraumatic. EOMI are intact. Mouth is well hydrated and without lesions. NECK: Supple. No masses LUNGS: Clear to auscultation. No presence of rhonchi/wheezing/rales. Adequate chest expansion HEART: RRR, normal s1 and s2. ABDOMEN: mildly tender in periumbilical area, no guarding, no peritoneal signs, and nondistended. BS +. No masses. EXTREMITIES: Without any cyanosis, clubbing, rash, lesions or edema. NEUROLOGIC: AOx3, no focal motor deficit. SKIN: no jaundice, no rashes   Imaging/Labs: as above  I personally reviewed and interpreted the available labs, imaging and endoscopic files.  Impression and Plan: SAJE GALLOP is a 69 y.o. female with PMH hepatitis C 1a s/p Harvoni, IBS-D, who presents for evaluation of recurrent heartburn, dysphagia and diarrhea.  The patient has presented a constellation of multiple gastrointestinal symptoms, which I consider likely to be functional in nature given to chronicity of her symptoms and lack of red flag signs.  We will attempt to treat her persistent heartburn with Dexilant and we will stop the use  of pantoprazole this point.  She can continue taking famotidine right now.  As her symptoms are likely related to functional etiology I will start her on nortriptyline low-dose 10 mg every day but she will discuss starting the medication with her psychiatrist as she does not want to have any significant interaction in the medications that she is taking for depression which I think is adequate.  She should continue taking her lamotrigine for treatment of diarrhea which has been effective so far.  Finally, given the presence of dysphagia we will proceed with an EGD with possible esophageal dilation.  - Schedule EGD with ED - Start nortriptyline 10 mg at night after discussing it with her psychiatrist - Stop pantoprazole and start Dexilant 60 mg qday - c/w Lomotil as needed for diarrhea  All questions were answered.      [name], MD Gastroenterology and Hepatology [facility]

## 2020-08-06 NOTE — Patient Instructions (Signed)
Schedule EGD with ED Start nortriptyline at night after discussing it with your psychiatrist Stop pantoprazole and start Dexilant 60 mg qday

## 2020-08-18 ENCOUNTER — Other Ambulatory Visit (HOSPITAL_COMMUNITY): Payer: Self-pay | Admitting: Family Medicine

## 2020-08-18 DIAGNOSIS — K219 Gastro-esophageal reflux disease without esophagitis: Secondary | ICD-10-CM | POA: Diagnosis not present

## 2020-08-18 DIAGNOSIS — E1169 Type 2 diabetes mellitus with other specified complication: Secondary | ICD-10-CM | POA: Diagnosis not present

## 2020-08-18 DIAGNOSIS — K589 Irritable bowel syndrome without diarrhea: Secondary | ICD-10-CM | POA: Diagnosis not present

## 2020-08-18 DIAGNOSIS — E7800 Pure hypercholesterolemia, unspecified: Secondary | ICD-10-CM | POA: Diagnosis not present

## 2020-08-18 DIAGNOSIS — J4520 Mild intermittent asthma, uncomplicated: Secondary | ICD-10-CM | POA: Diagnosis not present

## 2020-08-18 DIAGNOSIS — Z1231 Encounter for screening mammogram for malignant neoplasm of breast: Secondary | ICD-10-CM

## 2020-08-19 ENCOUNTER — Other Ambulatory Visit: Payer: Self-pay

## 2020-08-19 ENCOUNTER — Ambulatory Visit (INDEPENDENT_AMBULATORY_CARE_PROVIDER_SITE_OTHER): Payer: Medicare Other | Admitting: Psychiatry

## 2020-08-19 DIAGNOSIS — F411 Generalized anxiety disorder: Secondary | ICD-10-CM

## 2020-08-19 DIAGNOSIS — F332 Major depressive disorder, recurrent severe without psychotic features: Secondary | ICD-10-CM

## 2020-08-19 NOTE — Progress Notes (Signed)
Virtual Visit via Telephone Note  I connected with [name] on 08/19/20 at 3:05 pmby telephone and verified that I am speaking with the correct person using two identifiers.  Location: Patient: Lauren [name] Provider: [place] office    I discussed the limitations, risks, security and privacy concerns of performing an evaluation and management service by telephone and the availability of in person appointments. I also discussed with the patient that there may be a patient responsible charge related to this service. The patient expressed understanding and agreed to proceed.   I provided 50  minutes of non-face-to-face time during this encounter.   [name], LCSW THERAPIST PROGRESS NOTE    Location:  Patient - Home/Provider - [place] office   Session Time: Wednesday 08/19/2020 3:05 PM - 3:55 PM       Participation Level: Active  Behavioral Response: CasualAlertAngry, Anxious and Depressed,  Type of Therapy: Individual Therapy  Treatment Goals addressed: Begin healthy grieving process  Interventions: CBT and Supportive  Summary: Lauren [name] is a 69 y.o. female who presents who is referred for services due to stress, anxiety, and depression. She has participated in therapy intermittently for past 25 years. She reports 3 psychiatric hospitalizations with the last one occuring 4 years ago at [facility].   She reports increased stress related to husband's medical condition as he fell, broke his hip, and was placed in a nursing home.  Patient reports bleeding self for husband falling last year.  She reports having a hard time coping.  Symptoms include shame, blame, guilt, ruminating thoughts, feeling overwhelmed, depressed mood, excessive worry, and anxiety.  Patient last was seen via virtual visit about 4 weeks ago.  She continues to experience grief and loss issues regarding the death of her husband in 04/04/20.  She continues  to have  good days and bad days.  She reports increased thoughts about her husband and loneliness as the Christmas holiday approaches.  She  also has been having to take care of more matters alone where she normally would have involved her husband she has tried to maintain involvement in activities as well as socialization.  However, some of her socialization has been reduced due to patient no longer attending her women's group due to fear of COVID-19 exposure as many members of the group do not wear a mask.  She reports being a little less motivated.  Suicidal/Homicidal: Nowithout intent/plan  Therapist Response: reviewed symptoms, praised and reinforced forced patient's continued efforts for  behavioral activation/involvement with friends, assisted patient identify ways she depended on her husband and how her life has changed, began to discuss ways to assist patient adjust to the changes in her life, discussed using daily planning to assist patient with structure and balance, developed plan with patient to use daily planning and activity menu to assist her in her efforts, will send handout to patient.   Plan: Return again in 2 weeks.  Diagnosis: Axis I: MDD, Recurrent, GAD

## 2020-08-21 ENCOUNTER — Telehealth (HOSPITAL_COMMUNITY): Payer: Self-pay | Admitting: *Deleted

## 2020-08-21 NOTE — Telephone Encounter (Signed)
Patient called stating she went to her GI doctor yesterday and they put her on Nortriptyline 10 mg and suppose to take it at bedtime. Per patient she had some concerns about it interacting with her other medications that Dr. Harrington Challenger has her on at night as well. Per GI office to call Dr. Harrington Challenger and ask her to see if her medications do not interact with GI office. [phone].

## 2020-08-21 NOTE — Telephone Encounter (Signed)
Her dose of nortryptiline is very low, it wont interact, please tell her

## 2020-08-25 NOTE — Telephone Encounter (Signed)
Called pt and was not able to speak with her and staff left a detailed message on her voicemail with what provider stated. Office number left on voicemail if patient has further questions.

## 2020-08-26 ENCOUNTER — Ambulatory Visit (INDEPENDENT_AMBULATORY_CARE_PROVIDER_SITE_OTHER): Payer: Medicare Other | Admitting: Psychiatry

## 2020-08-26 ENCOUNTER — Other Ambulatory Visit: Payer: Self-pay

## 2020-08-26 DIAGNOSIS — F332 Major depressive disorder, recurrent severe without psychotic features: Secondary | ICD-10-CM

## 2020-08-26 DIAGNOSIS — F411 Generalized anxiety disorder: Secondary | ICD-10-CM | POA: Diagnosis not present

## 2020-08-26 NOTE — Progress Notes (Signed)
Virtual Visit via Telephone Note  I connected with [name] on 08/26/20 at 1:10 PM EST by telephone and verified that I am speaking with the correct person using two identifiers.  Location: Patient: Home Provider: [place] Office   I discussed the limitations, risks, security and privacy concerns of performing an evaluation and management service by telephone and the availability of in person appointments. I also discussed with the patient that there may be a patient responsible charge related to this service. The patient expressed understanding and agreed to proceed.  I provided 43 minutes of non-face-to-face time during this encounter.   [name], LCSW THERAPIST PROGRESS NOTE    Location:  Patient - Home/Provider - [place] office   Session Time: Wednesday 08/26/2020 1:10 PM - 1:53 PM    Participation Level: Active  Behavioral Response: CasualAlertAngry, Anxious and Depressed,  Type of Therapy: Individual Therapy  Treatment Goals addressed: Begin healthy grieving process  Interventions: CBT and Supportive  Summary: Lauren [name] is a 69 y.o. female who presents who is referred for services due to stress, anxiety, and depression. She has participated in therapy intermittently for past 25 years. She reports 3 psychiatric hospitalizations with the last one occuring 4 years ago at [facility].   She reports increased stress related to husband's medical condition as he fell, broke his hip, and was placed in a nursing home.  Patient reports bleeding self for husband falling last year.  She reports having a hard time coping.  Symptoms include shame, blame, guilt, ruminating thoughts, feeling overwhelmed, depressed mood, excessive worry, and anxiety.    Patient last was seen via virtual visit about a week ago.  She continues to experience grief and loss issues regarding the death of her husband in Apr 05, 2020 but reports  coping better.  Per her report, she realized she had been taking a lesser amount of her antidepressant that was prescribed for about 2 weeks.  She has been back on the correct dosage for about a week and reports much improvement.  She reports decreased crying spells.  She also has increased involvement in activity and socialization.  She continues to miss her husband but has been trying to focus more on positive memories.  She also also has made plans to celebrate Christmas with family.    Suicidal/Homicidal: Nowithout intent/plan  Therapist Response: reviewed symptoms, praised and reinforced forced patient's increased involvement in activity and socialization, discussed effects, reinforced patient's use of daily planning and activity menu, validated and normalized feelings regarding grief and loss, discussed integrated grief, discussed ways to cope with the upcoming Christmas holiday    Plan: Return again in 2 weeks.  Diagnosis: Axis I: MDD, Recurrent, GAD

## 2020-09-09 ENCOUNTER — Other Ambulatory Visit: Payer: Self-pay

## 2020-09-09 ENCOUNTER — Ambulatory Visit (INDEPENDENT_AMBULATORY_CARE_PROVIDER_SITE_OTHER): Payer: Medicare Other | Admitting: Psychiatry

## 2020-09-09 DIAGNOSIS — F332 Major depressive disorder, recurrent severe without psychotic features: Secondary | ICD-10-CM | POA: Diagnosis not present

## 2020-09-09 DIAGNOSIS — F411 Generalized anxiety disorder: Secondary | ICD-10-CM

## 2020-09-09 NOTE — Progress Notes (Signed)
Virtual Visit via Telephone Note  I connected with [name] on 09/09/20 at 2:06 PM EST  by telephone and verified that I am speaking with the correct person using two identifiers.  Location: Patient: Home Provider: [facility] [place] office    I discussed the limitations, risks, security and privacy concerns of performing an evaluation and management service by telephone and the availability of in person appointments. I also discussed with the patient that there may be a patient responsible charge related to this service. The patient expressed understanding and agreed to proceed.   I provided of non-face-to-face time during this encounter.   Adah Salvage, LCSW VTHERAPIST PROGRESS NOTE            Session Time: Wednesday 09/09/2020 2:06 PM - 2:58 PM   Participation Level: Active  Behavioral Response: CasualAlertAngry, Anxious and Depressed,  Type of Therapy: Individual Therapy  Treatment Goals addressed: Begin healthy grieving process  Interventions: CBT and Supportive  Summary: Lauren [name] is a 70 y.o. female who presents who is referred for services due to stress, anxiety, and depression. She has participated in therapy intermittently for past 25 years. She reports 3 psychiatric hospitalizations with the last one occuring 4 years ago at [facility].   She reports increased stress related to husband's medical condition as he fell, broke his hip, and was placed in a nursing home.  Patient reports bleeding self for husband falling last year.  She reports having a hard time coping.  Symptoms include shame, blame, guilt, ruminating thoughts, feeling overwhelmed, depressed mood, excessive worry, and anxiety.    Patient last was seen via virtual visit about 2 weeks ago.  She continues to experience grief and loss issues regarding the death of her husband in 07/23/21but reports continuing to use helpful coping skills.  She reports managing the  holidays well with support from friends.  She has maintained involvement in activities as well as social involvement.  Patient has been using daily planning which has been very helpful.  However, she reports increased stress regarding situations with a couple of her friends.  Patient has difficulty being assertive as she fears people will be upset with her if she says no.  She reports missing talking   with her husband and using his help to manage these situations.  Suicidal/Homicidal: Nowithout intent/plan  Therapist Response: reviewed symptoms, praised and reinforced forced patient's increased involvement in activity and socialization, discussed stressors, facilitated expression of thoughts and feelings, validated feelings, assisted patient identify thoughts that inhibit effective assertion and replace with statements that promote effective assertion, assisted patient develop script and did role play with patient regarding setting and maintaining limits with one of her friends, will send patient handout on assertive communication    Plan: Return again in 2 weeks.  Diagnosis: Axis I: MDD, Recurrent, GAD

## 2020-09-14 ENCOUNTER — Other Ambulatory Visit: Payer: Self-pay

## 2020-09-14 ENCOUNTER — Other Ambulatory Visit (HOSPITAL_COMMUNITY)
Admission: RE | Admit: 2020-09-14 | Discharge: 2020-09-14 | Disposition: A | Discharge status: Home / Self Care | Payer: Medicare Other | Source: Ambulatory Visit | Attending: Gastroenterology | Admitting: Gastroenterology

## 2020-09-14 DIAGNOSIS — Z20822 Contact with and (suspected) exposure to covid-19: Secondary | ICD-10-CM | POA: Insufficient documentation

## 2020-09-14 DIAGNOSIS — Z01812 Encounter for preprocedural laboratory examination: Secondary | ICD-10-CM | POA: Diagnosis not present

## 2020-09-14 LAB — SARS CORONAVIRUS 2 (TAT 6-24 HRS): SARS Coronavirus 2: NEGATIVE

## 2020-09-16 ENCOUNTER — Encounter (HOSPITAL_COMMUNITY): Payer: Self-pay | Admitting: Gastroenterology

## 2020-09-16 ENCOUNTER — Ambulatory Visit (HOSPITAL_COMMUNITY): Payer: Medicare Other | Admitting: Anesthesiology

## 2020-09-16 ENCOUNTER — Encounter (HOSPITAL_COMMUNITY)
Admission: RE | Disposition: A | Discharge status: Home / Self Care | Payer: Self-pay | Source: Home / Self Care | Attending: Gastroenterology

## 2020-09-16 ENCOUNTER — Other Ambulatory Visit: Payer: Self-pay

## 2020-09-16 ENCOUNTER — Ambulatory Visit (HOSPITAL_COMMUNITY)
Admission: RE | Admit: 2020-09-16 | Discharge: 2020-09-16 | Disposition: A | Discharge status: Home / Self Care | Payer: Medicare Other | Attending: Gastroenterology | Admitting: Gastroenterology

## 2020-09-16 DIAGNOSIS — Z888 Allergy status to other drugs, medicaments and biological substances status: Secondary | ICD-10-CM | POA: Insufficient documentation

## 2020-09-16 DIAGNOSIS — K2289 Other specified disease of esophagus: Secondary | ICD-10-CM | POA: Diagnosis not present

## 2020-09-16 DIAGNOSIS — Z91040 Latex allergy status: Secondary | ICD-10-CM | POA: Insufficient documentation

## 2020-09-16 DIAGNOSIS — Z87891 Personal history of nicotine dependence: Secondary | ICD-10-CM | POA: Diagnosis not present

## 2020-09-16 DIAGNOSIS — Z79899 Other long term (current) drug therapy: Secondary | ICD-10-CM | POA: Diagnosis not present

## 2020-09-16 DIAGNOSIS — K449 Diaphragmatic hernia without obstruction or gangrene: Secondary | ICD-10-CM | POA: Diagnosis not present

## 2020-09-16 DIAGNOSIS — Z8619 Personal history of other infectious and parasitic diseases: Secondary | ICD-10-CM | POA: Insufficient documentation

## 2020-09-16 DIAGNOSIS — R12 Heartburn: Secondary | ICD-10-CM

## 2020-09-16 DIAGNOSIS — Z7982 Long term (current) use of aspirin: Secondary | ICD-10-CM | POA: Diagnosis not present

## 2020-09-16 DIAGNOSIS — Z881 Allergy status to other antibiotic agents status: Secondary | ICD-10-CM | POA: Diagnosis not present

## 2020-09-16 DIAGNOSIS — K2270 Barrett's esophagus without dysplasia: Secondary | ICD-10-CM | POA: Diagnosis not present

## 2020-09-16 DIAGNOSIS — R1310 Dysphagia, unspecified: Secondary | ICD-10-CM | POA: Diagnosis not present

## 2020-09-16 DIAGNOSIS — K208 Other esophagitis without bleeding: Secondary | ICD-10-CM | POA: Diagnosis not present

## 2020-09-16 HISTORY — PX: BIOPSY: SHX5522

## 2020-09-16 HISTORY — PX: SAVORY DILATION: SHX5439

## 2020-09-16 HISTORY — PX: ESOPHAGOGASTRODUODENOSCOPY (EGD) WITH PROPOFOL: SHX5813

## 2020-09-16 HISTORY — DX: Headache, unspecified: R51.9

## 2020-09-16 SURGERY — ESOPHAGOGASTRODUODENOSCOPY (EGD) WITH PROPOFOL
Anesthesia: General

## 2020-09-16 MED ORDER — LACTATED RINGERS IV SOLN: INTRAVENOUS | Status: DC | PRN

## 2020-09-16 MED ORDER — LIDOCAINE HCL (CARDIAC) PF 100 MG/5ML IV SOSY
PREFILLED_SYRINGE | INTRAVENOUS | Status: DC | PRN
  Administered 2020-09-16: 20 mg via INTRAVENOUS

## 2020-09-16 MED ORDER — LIDOCAINE VISCOUS HCL 2 % MT SOLN
OROMUCOSAL | Status: AC
  Filled 2020-09-16: qty 15

## 2020-09-16 MED ORDER — GLYCOPYRROLATE 0.2 MG/ML IJ SOLN
0.2000 mg | Freq: Once | INTRAMUSCULAR | Status: AC
  Administered 2020-09-16: 0.2 mg via INTRAVENOUS

## 2020-09-16 MED ORDER — LACTATED RINGERS IV SOLN
Freq: Once | INTRAVENOUS | Status: AC
  Administered 2020-09-16: 1000 mL via INTRAVENOUS

## 2020-09-16 MED ORDER — PROPOFOL 10 MG/ML IV BOLUS
INTRAVENOUS | Status: DC | PRN
  Administered 2020-09-16: 40 mg via INTRAVENOUS
  Administered 2020-09-16: 30 mg via INTRAVENOUS
  Administered 2020-09-16: 80 mg via INTRAVENOUS
  Administered 2020-09-16: 20 mg via INTRAVENOUS
  Administered 2020-09-16: 40 mg via INTRAVENOUS

## 2020-09-16 MED ORDER — LIDOCAINE VISCOUS HCL 2 % MT SOLN
15.0000 mL | Freq: Once | OROMUCOSAL | Status: AC
  Administered 2020-09-16: 15 mL via OROMUCOSAL

## 2020-09-16 MED ORDER — GLYCOPYRROLATE 0.2 MG/ML IJ SOLN
INTRAMUSCULAR | Status: AC
  Filled 2020-09-16: qty 1

## 2020-09-16 NOTE — Op Note (Signed)
[facility] Patient Name: Lauren [name] Procedure Date: 09/16/2020 7:35 AM MRN: [id number] Date of Birth: 10/07/1950 Attending MD: [name] ,  CSN: [id number] Age: 70 Admit Type: Outpatient Procedure:                Upper GI endoscopy Indications:              Dysphagia, Heartburn Providers:                [name], Dana Sharon Seller, RN, Nelma Rothman, Technician Referring MD:              Medicines:                Monitored Anesthesia Care Complications:            No immediate complications. Estimated Blood Loss:     Estimated blood loss: none. Procedure:                Pre-Anesthesia Assessment:                           - Prior to the procedure, a History and Physical                            was performed, and patient medications, allergies                            and sensitivities were reviewed. The patient's                            tolerance of previous anesthesia was reviewed.                           - The risks and benefits of the procedure and the                            sedation options and risks were discussed with the                            patient. All questions were answered and informed                            consent was obtained.                           - ASA Grade Assessment: II - A patient with mild                            systemic disease.                           After obtaining informed consent, the endoscope was                            passed under direct vision. Throughout the  procedure, the patient's blood pressure, pulse, and                            oxygen saturations were monitored continuously. The                            [id number]-H190 ([id number]) scope was introduced through the                            mouth, and advanced to the second part of duodenum.                            The upper GI endoscopy was accomplished without                             difficulty. The patient tolerated the procedure                            well. Scope In: 7:44:44 AM Scope Out: 8:01:19 AM Total Procedure Duration: 0 hours 16 minutes 35 seconds  Findings:      No endoscopic abnormality was evident in the esophagus to explain the       patient's complaint of dysphagia. It was decided, however, to proceed       with dilation of the entire esophagus. A guidewire was placed and the       scope was withdrawn. Dilation was performed with a Savary dilator with       no resistance at 18 mm. No hematin seen upon reinspection.      There were esophageal mucosal changes (two islands) suspicious for       short-segment Barrett's esophagus present at the gastroesophageal       junction. The maximum longitudinal extent of these mucosal changes was 1       cm in length. This was biopsied with a cold forceps for histology.      A 2 cm hiatal hernia was present. There was a 2 mm nodule located distal       to the GE junction. Biopsies were taken with a cold forceps for       histology.      The entire examined stomach was normal.      The examined duodenum was normal. Biopsies were taken with a cold       forceps for histology. Impression:               - No endoscopic esophageal abnormality to explain                            patient's dysphagia. Esophagus dilated. Dilated.                           - Esophageal mucosal changes suspicious for                            short-segment Barrett's esophagus. Biopsied.                           - 2 cm hiatal hernia. Biopsied.                           -  Normal stomach.                           - Normal examined duodenum. Biopsied. Moderate Sedation:      Per Anesthesia Care Recommendation:           - Discharge patient to home (ambulatory).                           - Resume previous diet.                           - Await pathology results.                           - Continue pantoprazole 40 mg every 12 hours.                            - Schedule esophageal manometry and pH impedance                            testing on pantoprazole. Procedure Code(s):        --- Professional ---                           [id number], Esophagogastroduodenoscopy, flexible,                            transoral; with insertion of guide wire followed by                            passage of dilator(s) through esophagus over guide                            wire                           43239, 59, Esophagogastroduodenoscopy, flexible,                            transoral; with biopsy, single or multiple Diagnosis Code(s):        --- Professional ---                           R13.10, Dysphagia, unspecified                           K22.8, Other specified diseases of esophagus                           K44.9, Diaphragmatic hernia without obstruction or                            gangrene                           R12, Heartburn CPT copyright 2019 [employer]. All rights reserved. The codes documented in this report are preliminary and upon coder  review may  be revised to meet current compliance requirements. [name], MD [name],  09/16/2020 8:11:59 AM This report has been signed electronically. Number of Addenda: 0

## 2020-09-16 NOTE — Anesthesia Preprocedure Evaluation (Addendum)
Anesthesia Evaluation  Patient identified by MRN, date of birth, ID band Patient awake    Reviewed: Allergy & Precautions, NPO status , Patient's Chart, lab work & pertinent test results  History of Anesthesia Complications Negative for: history of anesthetic complications  Airway Mallampati: II  TM Distance: >3 FB Neck ROM: Full    Dental  (+) Dental Advisory Given   Pulmonary shortness of breath (room air spo2 - 94% ) and with exertion, pneumonia, COPD,  COPD inhaler, former smoker,   Room air spO2 -94%  breath sounds clear to auscultation       Cardiovascular Exercise Tolerance: Good Normal cardiovascular exam Rhythm:Regular Rate:Normal     Neuro/Psych  Headaches, PSYCHIATRIC DISORDERS Anxiety Depression  Neuromuscular disease    GI/Hepatic GERD  Medicated and Controlled,(+) Hepatitis -, C  Endo/Other    Renal/GU Renal disease     Musculoskeletal  (+) Arthritis , Fibromyalgia -  Abdominal   Peds  Hematology   Anesthesia Other Findings   Reproductive/Obstetrics                            Anesthesia Physical Anesthesia Plan  ASA: III  Anesthesia Plan: General   Post-op Pain Management:    Induction: Intravenous  PONV Risk Score and Plan: TIVA  Airway Management Planned: Nasal Cannula and Natural Airway  Additional Equipment:   Intra-op Plan:   Post-operative Plan:   Informed Consent: I have reviewed the patients History and Physical, chart, labs and discussed the procedure including the risks, benefits and alternatives for the proposed anesthesia with the patient or authorized representative who has indicated his/her understanding and acceptance.     Dental advisory given  Plan Discussed with: CRNA and Surgeon  Anesthesia Plan Comments:         Anesthesia Quick Evaluation

## 2020-09-16 NOTE — Anesthesia Postprocedure Evaluation (Signed)
Anesthesia Post Note  Patient: Lauren [name]  Procedure(s) Performed: ESOPHAGOGASTRODUODENOSCOPY (EGD) WITH PROPOFOL (N/A ) BIOPSY SAVORY DILATION  Patient location during evaluation: Endoscopy Anesthesia Type: General Level of consciousness: awake, oriented, awake and alert and patient cooperative Pain management: pain level controlled Vital Signs Assessment: post-procedure vital signs reviewed and stable Respiratory status: spontaneous breathing, respiratory function stable and nonlabored ventilation Cardiovascular status: blood pressure returned to baseline and stable Postop Assessment: no headache and no backache Anesthetic complications: no   No complications documented.   Last Vitals:  Vitals:   09/16/20 0700  BP: 134/77  Pulse: 87  Resp: 16  Temp: 36.8 C  SpO2: 94%    Last Pain:  Vitals:   09/16/20 0740  TempSrc:   PainSc: 0-No pain                 Tacy Learn

## 2020-09-16 NOTE — Discharge Instructions (Signed)
You are being discharged to home.  Resume your previous diet.  We are waiting for your pathology results. Continue pantoprazole 40 mg every 12 hours. Schedule esophageal manometry and pH impedance testing on pantoprazole.     Hiatal Hernia  A hiatal hernia occurs when part of the stomach slides above the muscle that separates the abdomen from the chest (diaphragm). A person can be born with a hiatal hernia (congenital), or it may develop over time. In almost all cases of hiatal hernia, only the top part of the stomach pushes through the diaphragm. Many people have a hiatal hernia with no symptoms. The larger the hernia, the more likely it is that you will have symptoms. In some cases, a hiatal hernia allows stomach acid to flow back into the tube that carries food from your mouth to your stomach (esophagus). This may cause heartburn symptoms. Severe heartburn symptoms may mean that you have developed a condition called gastroesophageal reflux disease (GERD). What are the causes? This condition is caused by a weakness in the opening (hiatus) where the esophagus passes through the diaphragm to attach to the upper part of the stomach. A person may be born with a weakness in the hiatus, or a weakness can develop over time. What increases the risk? This condition is more likely to develop in:  Older people. Age is a major risk factor for a hiatal hernia, especially if you are over the age of 56.  Pregnant women.  People who are overweight.  People who have frequent constipation. What are the signs or symptoms? Symptoms of this condition usually develop in the form of GERD symptoms. Symptoms include:  Heartburn.  Belching.  Indigestion.  Trouble swallowing.  Coughing or wheezing.  Sore throat.  Hoarseness.  Chest pain.  Nausea and vomiting. How is this diagnosed? This condition may be diagnosed during testing for GERD. Tests that may be done include:  X-rays of your stomach or  chest.  An upper gastrointestinal (GI) series. This is an X-ray exam of your GI tract that is taken after you swallow a chalky liquid that shows up clearly on the X-ray.  Endoscopy. This is a procedure to look into your stomach using a thin, flexible tube that has a tiny camera and light on the end of it. How is this treated? This condition may be treated by:  Dietary and lifestyle changes to help reduce GERD symptoms.  Medicines. These may include: ? Over-the-counter antacids. ? Medicines that make your stomach empty more quickly. ? Medicines that block the production of stomach acid (H2 blockers). ? Stronger medicines to reduce stomach acid (proton pump inhibitors).  Surgery to repair the hernia, if other treatments are not helping. If you have no symptoms, you may not need treatment. Follow these instructions at home: Lifestyle and activity  Do not use any products that contain nicotine or tobacco, such as cigarettes and e-cigarettes. If you need help quitting, ask your health care provider.  Try to achieve and maintain a healthy body weight.  Avoid putting pressure on your abdomen. Anything that puts pressure on your abdomen increases the amount of acid that may be pushed up into your esophagus. ? Avoid bending over, especially after eating. ? Raise the head of your bed by putting blocks under the legs. This keeps your head and esophagus higher than your stomach. ? Do not wear tight clothing around your chest or stomach. ? Try not to strain when having a bowel movement, when urinating, or when  lifting heavy objects. Eating and drinking  Avoid foods that can worsen GERD symptoms. These may include: ? Fatty foods, like fried foods. ? Citrus fruits, like oranges or lemon. ? Other foods and drinks that contain acid, like orange juice or tomatoes. ? Spicy food. ? Chocolate.  Eat frequent small meals instead of three large meals a day. This helps prevent your stomach from getting  too full. ? Eat slowly. ? Do not lie down right after eating. ? Do not eat 1-2 hours before bed.  Do not drink beverages with caffeine. These include cola, coffee, cocoa, and tea.  Do not drink alcohol. General instructions  Take over-the-counter and prescription medicines only as told by your health care provider.  Keep all follow-up visits as told by your health care provider. This is important. Contact a health care provider if:  Your symptoms are not controlled with medicines or lifestyle changes.  You are having trouble swallowing.  You have coughing or wheezing that will not go away. Get help right away if:  Your pain is getting worse.  Your pain spreads to your arms, neck, jaw, teeth, or back.  You have shortness of breath.  You sweat for no reason.  You feel sick to your stomach (nauseous) or you vomit.  You vomit blood.  You have bright red blood in your stools.  You have black, tarry stools. This information is not intended to replace advice given to you by your health care provider. Make sure you discuss any questions you have with your health care provider. Document Revised: 08/04/2017 Document Reviewed: 03/27/2017 Elsevier Patient Education  2021 [place].  Upper Endoscopy, Adult, Care After This sheet gives you information about how to care for yourself after your procedure. Your health care provider may also give you more specific instructions. If you have problems or questions, contact your health care provider. What can I expect after the procedure? After the procedure, it is common to have:  A sore throat.  Mild stomach pain or discomfort.  Bloating.  Nausea. Follow these instructions at home:  Follow instructions from your health care provider about what to eat or drink after your procedure.  Return to your normal activities as told by your health care provider. Ask your health care provider what activities are safe for you.  Take  over-the-counter and prescription medicines only as told by your health care provider.  If you were given a sedative during the procedure, it can affect you for several hours. Do not drive or operate machinery until your health care provider says that it is safe.  Keep all follow-up visits as told by your health care provider. This is important.   Contact a health care provider if you have:  A sore throat that lasts longer than one day.  Trouble swallowing. Get help right away if:  You vomit blood or your vomit looks like coffee grounds.  You have: ? A fever. ? Bloody, black, or tarry stools. ? A severe sore throat or you cannot swallow. ? Difficulty breathing. ? Severe pain in your chest or abdomen. Summary  After the procedure, it is common to have a sore throat, mild stomach discomfort, bloating, and nausea.  If you were given a sedative during the procedure, it can affect you for several hours. Do not drive or operate machinery until your health care provider says that it is safe.  Follow instructions from your health care provider about what to eat or drink after your procedure.  Return to your normal activities as told by your health care provider. This information is not intended to replace advice given to you by your health care provider. Make sure you discuss any questions you have with your health care provider. Document Revised: 08/20/2019 Document Reviewed: 01/22/2018 Elsevier Patient Education  2021 [employer].

## 2020-09-16 NOTE — H&P (Signed)
Lauren [name] is an 70 y.o. female.   Chief Complaint: heartburn, dysphagia HPI: Lauren [name] is a 70 y.o. female with PMH hepatitis C 1a s/p Harvoni, IBS-D, who comes to the hospital foer evaluation of heartburn and dysphagia.  Patient states for multiple years she has had intermittent episodes of heartburn.  She had adequate control of her symptoms in the past with ranitidine but this was taken off the market.  She has tried Prilosec, Nexium and pantoprazole at different doses without any significant improvement of her symptoms.  She is still having heartburn on a daily basis.  Could not afford buying Dexilant so has not tried this yet.  On the other hand, he has been episodes of dysphagia to solids intermittently but no odynophagia or weight loss.  Last EGD:2016 - Serrated gastroesophageal junction with focal erythema consistent with mild changes of reflux esophagitis. Biopsy taken to rule out short segment Barrett's. Pathology positive for BE. No evidence of peptic ulcer disease or pyloric stenosis.  Last Colonoscopy: 2016 - Prep satisfactory. Normal mucosa of cecum, ascending colon, hepatic flexure, transverse colon, splenic flexure, descending and sigmoid colon. Normal rectal mucosa. Small hemorrhoids below the dentate line.  Past Medical History:  Diagnosis Date  . Anxiety   . Chronic pain   . COPD (chronic obstructive pulmonary disease) ([place])   . Depression   . Fibromyalgia   . Fibromyalgia   . GERD (gastroesophageal reflux disease)   . Headache   . Hepatitis C   . History of blood transfusion   . History of bronchitis   . IBS (irritable bowel syndrome)   . Osteoarthritis   . Pneumonia   . Prediabetes   . Shortness of breath dyspnea    allergies; increased pain;   . Vertigo   . Wears glasses     Past Surgical History:  Procedure Laterality Date  . CATARACT EXTRACTION W/PHACO Right 08/01/2017   Procedure: CATARACT EXTRACTION PHACO AND INTRAOCULAR LENS  PLACEMENT RIGHT EYE;  Surgeon: Rutherford Guys, MD;  Location: [facility];  Service: Ophthalmology;  Laterality: Right;  CDE: 3.30  . CATARACT EXTRACTION W/PHACO Left 08/15/2017   Procedure: CATARACT EXTRACTION PHACO AND INTRAOCULAR LENS PLACEMENT (IOC);  Surgeon: Rutherford Guys, MD;  Location: [facility];  Service: Ophthalmology;  Laterality: Left;  CDE: 4.00  . CHOLECYSTECTOMY    . COLONOSCOPY    . COLONOSCOPY N/A 10/23/2014   Procedure: COLONOSCOPY;  Surgeon: Rogene Houston, MD;  Location: [facility];  Service: Endoscopy;  Laterality: N/A;  1030  . ESOPHAGOGASTRODUODENOSCOPY N/A 10/23/2014   Procedure: ESOPHAGOGASTRODUODENOSCOPY (EGD);  Surgeon: Rogene Houston, MD;  Location: [facility];  Service: Endoscopy;  Laterality: N/A;  . KNEE ARTHROSCOPY Left 10/21/2015   Procedure: ARTHROSCOPY LEFT KNEE WITH MENICAL DEBRIDEMENT, Chondroplasty;  Surgeon: Gaynelle Arabian, MD;  Location: [facility];  Service: Orthopedics;  Laterality: Left;  Marland Kitchen MANDIBLE SURGERY  09/06/1979  . ORIF WRIST FRACTURE Right 07/23/2013   Procedure: OPEN REDUCTION INTERNAL FIXATION (ORIF) WRIST FRACTURE;  Surgeon: Roseanne Kaufman, MD;  Location: [facility];  Service: Orthopedics;  Laterality: Right;  . TONSILLECTOMY    . TOTAL KNEE ARTHROPLASTY     2011 rt knee  . TOTAL KNEE ARTHROPLASTY Left 04/16/2018   Procedure: LEFT TOTAL KNEE ARTHROPLASTY;  Surgeon: Gaynelle Arabian, MD;  Location: [facility];  Service: Orthopedics;  Laterality: Left;  . UPPER GASTROINTESTINAL ENDOSCOPY      Family History  Problem Relation Age of Onset  . Alcohol abuse Father   .  Diabetes Father   . Stroke Father   . Depression Father   . Alcohol abuse Maternal Grandfather   . Alcohol abuse Maternal Grandmother   . Alcohol abuse Paternal Grandfather   . Alcohol abuse Paternal Grandmother   . Stroke Mother   . Irritable bowel syndrome Mother   . Sexual abuse Mother   . Diabetes Brother   . Healthy Brother   . Diabetes Brother   . Heart disease Brother   . Anxiety  disorder Paternal Uncle   . Alcohol abuse Paternal Uncle   . Alcohol abuse Cousin   . Anxiety disorder Maternal Uncle   . ADD / ADHD Neg Hx   . Bipolar disorder Neg Hx   . Dementia Neg Hx   . Drug abuse Neg Hx   . Paranoid behavior Neg Hx   . Schizophrenia Neg Hx   . Seizures Neg Hx   . Physical abuse Neg Hx    Social History:  reports that she quit smoking about 38 years ago. Her smoking use included cigarettes. She has a 30.00 pack-year smoking history. She has never used smokeless tobacco. She reports that she does not drink alcohol and does not use drugs.  Allergies:  Allergies  Allergen Reactions  . Elavil [Amitriptyline] Other (See Comments)    Vivid dreams and almost suicidal  . Flagyl [Metronidazole] Itching  . Vistaril [Hydroxyzine Hcl] Other (See Comments)    Got higher than a kite on too much of this.  [place] Royal [Ziprasidone Hydrochloride] Other (See Comments)    Blacked out  . Lactose Intolerance (Gi) Other (See Comments)    GI upset  . Latex Other (See Comments)    Red at site    Medications Prior to Admission  Medication Sig Dispense Refill  . albuterol (PROAIR HFA) 108 (90 BASE) MCG/ACT inhaler Inhale 2 puffs into the lungs every 4 (four) hours as needed for wheezing or shortness of breath.    . Biotin w/ Vitamins C & E (HAIR/SKIN/NAILS PO) Take 2 tablets by mouth daily.    . calcium-vitamin D (OSCAL WITH D) 500-200 MG-UNIT per tablet Take 1 tablet by mouth 2 (two) times daily. For low calcium    . Cyanocobalamin (B-12) 2000 MCG TABS Take 2,000 mcg by mouth daily.    Marland Kitchen dexlansoprazole (DEXILANT) 60 MG capsule Take 1 capsule (60 mg total) by mouth daily. 90 capsule 3  . dicyclomine (BENTYL) 20 MG tablet Take 1 tablet (20 mg total) by mouth every 6 (six) hours as needed for spasms. 90 tablet 2  . gabapentin (NEURONTIN) 100 MG capsule Take 200-300 mg by mouth See admin instructions. Take 200mg  by mouth every day in the morning and take 300mg  by mouth at bedtime    .  lamoTRIgine (LAMICTAL) 100 MG tablet Take 1 tablet (100 mg total) by mouth 2 (two) times daily. 180 tablet 2  . lisinopril (ZESTRIL) 5 MG tablet Take 5 mg by mouth at bedtime.     Marland Kitchen LORazepam (ATIVAN) 1 MG tablet Take 1 tablet (1 mg total) by mouth 3 (three) times daily as needed for anxiety. 90 tablet 2  . Multiple Vitamins-Minerals (MULTIVITAMIN WITH MINERALS) tablet Take 1 tablet daily by mouth.    . Omega-3 Fatty Acids (FISH OIL) 1000 MG CPDR Take 1 capsule by mouth daily.    Marland Kitchen OVER THE COUNTER MEDICATION Take 1 tablet by mouth daily at 8 pm. Calcium, Magnesium, and Zinc once per day.    Marland Kitchen OVER THE COUNTER  MEDICATION Take 1 tablet by mouth daily. Garlic Extract 123XX123 mg once per day.    Marland Kitchen OVER THE COUNTER MEDICATION Take 1 tablet by mouth daily. Vitamin D3 25 mcg (1000 IU) once per day.    . pantoprazole (PROTONIX) 40 MG tablet Take 40 mg by mouth 2 (two) times daily.    . pravastatin (PRAVACHOL) 80 MG tablet Take 80 mg at bedtime by mouth.     . Turmeric 450 MG CAPS Take 450 mg by mouth daily.    Marland Kitchen venlafaxine XR (EFFEXOR XR) 75 MG 24 hr capsule Take 3 capsules (225 mg total) by mouth daily with breakfast. 270 capsule 2  . vitamin E 400 UNIT capsule Take 400 Units daily by mouth.    Marland Kitchen aspirin EC 81 MG tablet Take 81 mg by mouth daily. Swallow whole.    . diclofenac sodium (VOLTAREN) 1 % GEL Apply 2 g topically 4 (four) times daily as needed (For pain.).     Marland Kitchen diphenoxylate-atropine (LOMOTIL) 2.5-0.025 MG tablet Take 1 tablet by mouth 3 (three) times daily as needed. (Patient taking differently: Take 1 tablet by mouth 3 (three) times daily as needed for diarrhea or loose stools.) 60 tablet 6  . nortriptyline (PAMELOR) 10 MG capsule Take 1 capsule (10 mg total) by mouth at bedtime. 30 capsule 2    Results for orders placed or performed during the hospital encounter of 09/14/20 (from the past 48 hour(s))  SARS CORONAVIRUS 2 (TAT 6-24 HRS) Nasopharyngeal Nasopharyngeal Swab     Status: None    Collection Time: 09/14/20 10:00 AM   Specimen: Nasopharyngeal Swab  Result Value Ref Range   SARS Coronavirus 2 NEGATIVE NEGATIVE    Comment: (NOTE) SARS-CoV-2 target nucleic acids are NOT DETECTED.  The SARS-CoV-2 RNA is generally detectable in upper and lower respiratory specimens during the acute phase of infection. Negative results do not preclude SARS-CoV-2 infection, do not rule out co-infections with other pathogens, and should not be used as the sole basis for treatment or other patient management decisions. Negative results must be combined with clinical observations, patient history, and epidemiological information. The expected result is Negative.  Fact Sheet for Patients: SugarRoll.be  Fact Sheet for Healthcare Providers: https://www.woods-mathews.com/  This test is not yet approved or cleared by the [place] FDA and  has been authorized for detection and/or diagnosis of SARS-CoV-2 by FDA under an Emergency Use Authorization (EUA). This EUA will remain  in effect (meaning this test can be used) for the duration of the COVID-19 declaration under Se ction 564(b)(1) of the Act, 21 U.S.C. section 360bbb-3(b)(1), unless the authorization is terminated or revoked sooner.  Performed at [facility], [place] [address], [place], [place] [address]    No results found.  Review of Systems  Constitutional: Negative.   HENT: Positive for trouble swallowing.   Eyes: Negative.   Respiratory: Negative.   Cardiovascular: Negative.   Gastrointestinal: Negative.   Endocrine: Negative.   Genitourinary: Negative.   Musculoskeletal: Negative.   Skin: Negative.   Allergic/Immunologic: Negative.   Neurological: Negative.   Hematological: Negative.   Psychiatric/Behavioral: Negative.     Blood pressure 134/77, pulse 87, temperature 98.2 F (36.8 C), temperature source Oral, resp. rate 16, height 5\' 6"  (1.676 m), weight 71.7 kg,  SpO2 94 %. Physical Exam  GENERAL: The patient is AO x3, in no acute distress. HEENT: Head is normocephalic and atraumatic. EOMI are intact. Mouth is well hydrated and without lesions. NECK: Supple. No  masses LUNGS: Clear to auscultation. No presence of rhonchi/wheezing/rales. Adequate chest expansion HEART: RRR, normal s1 and s2. ABDOMEN: Soft, nontender, no guarding, no peritoneal signs, and nondistended. BS +. No masses. EXTREMITIES: Without any cyanosis, clubbing, rash, lesions or edema. NEUROLOGIC: AOx3, no focal motor deficit. SKIN: no jaundice, no rashes  Assessment/Plan AMILLION SCOBEE is a 70 y.o. female with PMH hepatitis C 1a s/p Harvoni, IBS-D, who comes to the hospital foer evaluation of heartburn and dysphagia. Will proceed with EGD with ED.   Harvel Quale, MD 09/16/2020, 7:34 AM

## 2020-09-16 NOTE — Transfer of Care (Signed)
Immediate Anesthesia Transfer of Care Note  Patient: Lauren [name]  Procedure(s) Performed: ESOPHAGOGASTRODUODENOSCOPY (EGD) WITH PROPOFOL (N/A ) BIOPSY SAVORY DILATION  Patient Location: Endoscopy Unit  Anesthesia Type:General  Level of Consciousness: awake, alert , oriented and patient cooperative  Airway & Oxygen Therapy: Patient Spontanous Breathing  Post-op Assessment: Report given to RN, Post -op Vital signs reviewed and stable and Patient moving all extremities  Post vital signs: Reviewed and stable  Last Vitals:  Vitals Value Taken Time  BP    Temp    Pulse    Resp    SpO2      Last Pain:  Vitals:   09/16/20 0740  TempSrc:   PainSc: 0-No pain      Patients Stated Pain Goal: 5 (16/07/37 1062)  Complications: No complications documented.

## 2020-09-17 LAB — SURGICAL PATHOLOGY

## 2020-09-18 ENCOUNTER — Encounter (HOSPITAL_COMMUNITY): Payer: Self-pay | Admitting: Gastroenterology

## 2020-09-21 ENCOUNTER — Ambulatory Visit (HOSPITAL_COMMUNITY): Payer: Medicare Other

## 2020-09-23 ENCOUNTER — Ambulatory Visit (INDEPENDENT_AMBULATORY_CARE_PROVIDER_SITE_OTHER): Payer: Medicare Other | Admitting: Psychiatry

## 2020-09-23 ENCOUNTER — Other Ambulatory Visit: Payer: Self-pay

## 2020-09-23 DIAGNOSIS — F411 Generalized anxiety disorder: Secondary | ICD-10-CM | POA: Diagnosis not present

## 2020-09-23 DIAGNOSIS — F332 Major depressive disorder, recurrent severe without psychotic features: Secondary | ICD-10-CM | POA: Diagnosis not present

## 2020-09-23 NOTE — Progress Notes (Signed)
Virtual Visit via Telephone Note  I connected with Lauren [name] on 09/23/20 at 1:10 PM EST by telephone and verified that I am speaking with the correct person using two identifiers.  Location: Patient: Home Provider: [place] office    I discussed the limitations, risks, security and privacy concerns of performing an evaluation and management service by telephone and the availability of in person appointments. I also discussed with the patient that there may be a patient responsible charge related to this service. The patient expressed understanding and agreed to proceed.    I provided  45  minutes of non-face-to-face time during this encounter.   [name], LCSW       THERAPIST PROGRESS NOTE            Session Time: Wednesday 1/19 /2022 1:10 PM - 1:55 PM   Participation Level: Active  Behavioral Response: CasualAlertAngry, Anxious and Depressed,  Type of Therapy: Individual Therapy  Treatment Goals addressed: Patient states wanting to be more positive, complete task sand improve organizational skills in adjusting to life without her husband/appropriately grieve loss and return to  previous adaptive level of functioning  Interventions: CBT and Supportive  Summary: Lauren [name] is a 70 y.o. female who presents who is referred for services due to stress, anxiety, and depression. She has participated in therapy intermittently for past 25 years. She reports 3 psychiatric hospitalizations with the last one occuring 4 years ago at [facility].   She reports increased stress related to husband's medical condition as he fell, broke his hip, and was placed in a nursing home.  Patient reports bleeding self for husband falling last year.  She reports having a hard time coping.  Symptoms include shame, blame, guilt, ruminating thoughts, feeling overwhelmed, depressed mood, excessive worry, and anxiety.    Patient last was seen via virtual visit  about 2 weeks ago.  She continues to experience grief and loss issues regarding the death of her husband in 03/11/2020.  She has tried to be more assertive with others.  She is maintaining involvement in activities as well as socialization with friends.  She also has improved self-care and reports riding her bike.  She has consistently used daily planning.  However, she expresses  frustration with self as she has been unable to complete tasks as she would like. She reports sometimes feeling overwhelmed when trying to organize and sort items that remind her of her husband.  She reports encountering more situations where she normally would have consulted with husband.  She expresses increased confidence in her ability to manage some of these matters.  She continues to report pattern of should and ought thoughts about self and her performance.    Suicidal/Homicidal: Nowithout intent/plan  Therapist Response: reviewed symptoms, praised and reinforced forced patient's continued  involvement in activity and socialization, discussed stressors, facilitated expression of thoughts and feelings, validated feelings, reviewed treatment plan, obtaining patient's permission to initial plan for patient as this was a virtual visit .   Plan: Return again in 2 weeks.  Diagnosis: Axis I: MDD, Recurrent, GAD

## 2020-09-28 ENCOUNTER — Telehealth (INDEPENDENT_AMBULATORY_CARE_PROVIDER_SITE_OTHER): Payer: Medicare Other | Admitting: Psychiatry

## 2020-09-28 ENCOUNTER — Other Ambulatory Visit: Payer: Self-pay

## 2020-09-28 ENCOUNTER — Encounter (HOSPITAL_COMMUNITY): Payer: Self-pay | Admitting: Psychiatry

## 2020-09-28 DIAGNOSIS — F332 Major depressive disorder, recurrent severe without psychotic features: Secondary | ICD-10-CM

## 2020-09-28 DIAGNOSIS — F411 Generalized anxiety disorder: Secondary | ICD-10-CM | POA: Diagnosis not present

## 2020-09-28 MED ORDER — LORAZEPAM 1 MG PO TABS: 1.0000 mg | ORAL_TABLET | Freq: Three times a day (TID) | ORAL | 2 refills | Status: DC | PRN

## 2020-09-28 MED ORDER — LAMOTRIGINE 100 MG PO TABS: 100.0000 mg | ORAL_TABLET | Freq: Two times a day (BID) | ORAL | 2 refills | Status: DC

## 2020-09-28 MED ORDER — VENLAFAXINE HCL ER 75 MG PO CP24: 225.0000 mg | ORAL_CAPSULE | Freq: Every day | ORAL | 2 refills | Status: DC

## 2020-09-28 NOTE — Progress Notes (Signed)
Virtual Visit via Telephone Note  I connected with Lauren [name] on 09/28/20 at  1:00 PM EST by telephone and verified that I am speaking with the correct person using two identifiers.  Location: Patient: home Provider: home   I discussed the limitations, risks, security and privacy concerns of performing an evaluation and management service by telephone and the availability of in person appointments. I also discussed with the patient that there may be a patient responsible charge related to this service. The patient expressed understanding and agreed to proceed.     I discussed the assessment and treatment plan with the patient. The patient was provided an opportunity to ask questions and all were answered. The patient agreed with the plan and demonstrated an understanding of the instructions.   The patient was advised to call back or seek an in-person evaluation if the symptoms worsen or if the condition fails to improve as anticipated.  I provided 15 minutes of non-face-to-face time during this encounter.   Levonne Spiller, MD  [facility] MD/PA/NP OP Progress Note  09/28/2020 1:18 PM Lauren [name]  MRN:  [id number]  Chief Complaint:  Chief Complaint    Anxiety; Depression     HPI: This patient is a70 year old widowed white female who lives alone in [place]. She has one stepchild. Her step son killed himself12years ago.Marland Kitchen He was an alcoholic. The patient is on disability for fibromyalgia  The patient has a long history of depression. She was last hospitalized in Dec 11, 2008 after she took too many pain pills and got confused. She use to drink heavily but has been sober 23 years and is very active in [employer] and church. For the most part she feels her mood is stable. She sleeping pretty well. Her energy is good. She has a hard time functioning without Ativan but she only usually takes one pill a day side think this is reasonable. She tried a higher dose of Cymbalta but it made her manic and  revved up  The patient returns for follow-up after 3 months.  She is still struggling with losing her husband and her dog.  Her dog died in Dec 12, 2022 and then her husband in July of last year.  She states that she has good and bad days.  Overall the medication seem to be helping with her depression anxiety and sleep.  She denies thoughts of self-harm or suicide.  She denies manic symptoms.  She is only been taking 150 mg of the Effexor and I urged her to take the 225 mg dose to help more with depression. Visit Diagnosis:    ICD-10-CM   1. Major depressive disorder, recurrent, severe without psychotic features ([place])  F33.2   2. Generalized anxiety disorder  F41.1     Past Psychiatric History: Long history of depression and mood instability.  She was last hospitalized about 6 years ago for suicidal ideation  Past Medical History:  Past Medical History:  Diagnosis Date  . Anxiety   . Chronic pain   . COPD (chronic obstructive pulmonary disease) ([place])   . Depression   . Fibromyalgia   . Fibromyalgia   . GERD (gastroesophageal reflux disease)   . Headache   . Hepatitis C   . History of blood transfusion   . History of bronchitis   . IBS (irritable bowel syndrome)   . Osteoarthritis   . Pneumonia   . Prediabetes   . Shortness of breath dyspnea    allergies; increased pain;   . Vertigo   .  Wears glasses     Past Surgical History:  Procedure Laterality Date  . BIOPSY  09/16/2020   Procedure: BIOPSY;  Surgeon: Harvel Quale, MD;  [facility];  Service: Gastroenterology;;  . CATARACT EXTRACTION W/PHACO Right 08/01/2017   Procedure: CATARACT EXTRACTION PHACO AND INTRAOCULAR LENS PLACEMENT RIGHT EYE;  Surgeon: Rutherford Guys, MD;  Location: [facility];  Service: Ophthalmology;  Laterality: Right;  CDE: 3.30  . CATARACT EXTRACTION W/PHACO Left 08/15/2017   Procedure: CATARACT EXTRACTION PHACO AND INTRAOCULAR LENS PLACEMENT (IOC);  Surgeon: Rutherford Guys, MD;  Location: [facility];  Service: Ophthalmology;  Laterality: Left;  CDE: 4.00  . CHOLECYSTECTOMY    . COLONOSCOPY    . COLONOSCOPY N/A 10/23/2014   Procedure: COLONOSCOPY;  Surgeon: Rogene Houston, MD;  [facility];  Service: Endoscopy;  Laterality: N/A;  1030  . ESOPHAGOGASTRODUODENOSCOPY N/A 10/23/2014   Procedure: ESOPHAGOGASTRODUODENOSCOPY (EGD);  Surgeon: Rogene Houston, MD;  [facility];  Service: Endoscopy;  Laterality: N/A;  . ESOPHAGOGASTRODUODENOSCOPY (EGD) WITH PROPOFOL N/A 09/16/2020   Procedure: ESOPHAGOGASTRODUODENOSCOPY (EGD) WITH PROPOFOL;  Surgeon: Harvel Quale, MD;  [facility];  Service: Gastroenterology;  Laterality: N/A;  7:30  . KNEE ARTHROSCOPY Left 10/21/2015   Procedure: ARTHROSCOPY LEFT KNEE WITH MENICAL DEBRIDEMENT, Chondroplasty;  Surgeon: Gaynelle Arabian, MD;  Location: [facility];  Service: Orthopedics;  Laterality: Left;  Marland Kitchen MANDIBLE SURGERY  09/06/1979  . ORIF WRIST FRACTURE Right 07/23/2013   Procedure: OPEN REDUCTION INTERNAL FIXATION (ORIF) WRIST FRACTURE;  Surgeon: Roseanne Kaufman, MD;  Location: [place];  Service: Orthopedics;  Laterality: Right;  . SAVORY DILATION  09/16/2020   Procedure: SAVORY DILATION;  Surgeon: Montez Morita, Quillian Quince, MD;  [facility];  Service: Gastroenterology;;  . TONSILLECTOMY    . TOTAL KNEE ARTHROPLASTY     2011 rt knee  . TOTAL KNEE ARTHROPLASTY Left 04/16/2018   Procedure: LEFT TOTAL KNEE ARTHROPLASTY;  Surgeon: Gaynelle Arabian, MD;  [facility];  Service: Orthopedics;  Laterality: Left;  . UPPER GASTROINTESTINAL ENDOSCOPY      Family Psychiatric History: see below  Family History:  Family History  Problem Relation Age of Onset  . Alcohol abuse Father   . Diabetes Father   . Stroke Father   . Depression Father   . Alcohol abuse Maternal Grandfather   . Alcohol abuse Maternal Grandmother   . Alcohol abuse Paternal Grandfather   . Alcohol abuse Paternal Grandmother   . Stroke Mother    . Irritable bowel syndrome Mother   . Sexual abuse Mother   . Diabetes Brother   . Healthy Brother   . Diabetes Brother   . Heart disease Brother   . Anxiety disorder Paternal Uncle   . Alcohol abuse Paternal Uncle   . Alcohol abuse Cousin   . Anxiety disorder Maternal Uncle   . ADD / ADHD Neg Hx   . Bipolar disorder Neg Hx   . Dementia Neg Hx   . Drug abuse Neg Hx   . Paranoid behavior Neg Hx   . Schizophrenia Neg Hx   . Seizures Neg Hx   . Physical abuse Neg Hx     Social History:  Social History   Socioeconomic History  . Marital status: Married    Spouse name: Not on file  . Number of children: Not on file  . Years of education: Not on file  . Highest education level: Not on file  Occupational History  .  Not on file  Tobacco Use  . Smoking status: Former Smoker    Packs/day: 2.00    Years: 15.00    Pack years: 30.00    Types: Cigarettes    Quit date: 09/05/1982    Years since quitting: 38.0  . Smokeless tobacco: Never Used  Vaping Use  . Vaping Use: Never used  Substance and Sexual Activity  . Alcohol use: No    Alcohol/week: 0.0 standard drinks    Comment: history of alcoholism; pt states has been sober 28 years  . Drug use: No  . Sexual activity: Never  Other Topics Concern  . Not on file  Social History Narrative  . Not on file   Social Determinants of Health   Financial Resource Strain: Not on file  Food Insecurity: Not on file  Transportation Needs: Not on file  Physical Activity: Not on file  Stress: Not on file  Social Connections: Not on file    Allergies:  Allergies  Allergen Reactions  . Elavil [Amitriptyline] Other (See Comments)    Vivid dreams and almost suicidal  . Flagyl [Metronidazole] Itching  . Vistaril [Hydroxyzine Hcl] Other (See Comments)    Got higher than a kite on too much of this.  [place] [Ziprasidone Hydrochloride] Other (See Comments)    Blacked out  . Lactose Intolerance (Gi) Other (See Comments)    GI upset   . Latex Other (See Comments)    Red at site    Metabolic Disorder Labs: Lab Results  Component Value Date   HGBA1C 6.1 (H) 10/13/2015   MPG 128 10/13/2015   No results found for: PROLACTIN No results found for: CHOL, TRIG, HDL, CHOLHDL, VLDL, LDLCALC Lab Results  Component Value Date   TSH 2.400 03/19/2014    Therapeutic Level Labs: No results found for: LITHIUM No results found for: VALPROATE No components found for:  CBMZ  Current Medications: Current Outpatient Medications  Medication Sig Dispense Refill  . albuterol (PROAIR HFA) 108 (90 BASE) MCG/ACT inhaler Inhale 2 puffs into the lungs every 4 (four) hours as needed for wheezing or shortness of breath.    Marland Kitchen aspirin EC 81 MG tablet Take 81 mg by mouth daily. Swallow whole.    . Biotin w/ Vitamins C & E (HAIR/SKIN/NAILS PO) Take 2 tablets by mouth daily.    . calcium-vitamin D (OSCAL WITH D) 500-200 MG-UNIT per tablet Take 1 tablet by mouth 2 (two) times daily. For low calcium    . Cyanocobalamin (B-12) 2000 MCG TABS Take 2,000 mcg by mouth daily.    Marland Kitchen dexlansoprazole (DEXILANT) 60 MG capsule Take 1 capsule (60 mg total) by mouth daily. 90 capsule 3  . diclofenac sodium (VOLTAREN) 1 % GEL Apply 2 g topically 4 (four) times daily as needed (For pain.).     Marland Kitchen diphenoxylate-atropine (LOMOTIL) 2.5-0.025 MG tablet Take 1 tablet by mouth 3 (three) times daily as needed. (Patient taking differently: Take 1 tablet by mouth 3 (three) times daily as needed for diarrhea or loose stools.) 60 tablet 6  . gabapentin (NEURONTIN) 100 MG capsule Take 200-300 mg by mouth See admin instructions. Take 200mg  by mouth every day in the morning and take 300mg  by mouth at bedtime    . lamoTRIgine (LAMICTAL) 100 MG tablet Take 1 tablet (100 mg total) by mouth 2 (two) times daily. 180 tablet 2  . lisinopril (ZESTRIL) 5 MG tablet Take 5 mg by mouth at bedtime.     Marland Kitchen LORazepam (ATIVAN) 1  MG tablet Take 1 tablet (1 mg total) by mouth 3 (three) times  daily as needed for anxiety. 90 tablet 2  . Multiple Vitamins-Minerals (MULTIVITAMIN WITH MINERALS) tablet Take 1 tablet daily by mouth.    . nortriptyline (PAMELOR) 10 MG capsule Take 1 capsule (10 mg total) by mouth at bedtime. 30 capsule 2  . Omega-3 Fatty Acids (FISH OIL) 1000 MG CPDR Take 1 capsule by mouth daily.    Marland Kitchen OVER THE COUNTER MEDICATION Take 1 tablet by mouth daily at 8 pm. Calcium, Magnesium, and Zinc once per day.    Marland Kitchen OVER THE COUNTER MEDICATION Take 1 tablet by mouth daily. Garlic Extract [id number] mg once per day.    Marland Kitchen OVER THE COUNTER MEDICATION Take 1 tablet by mouth daily. Vitamin D3 25 mcg (1000 IU) once per day.    . pantoprazole (PROTONIX) 40 MG tablet Take 40 mg by mouth 2 (two) times daily.    . pravastatin (PRAVACHOL) 80 MG tablet Take 80 mg at bedtime by mouth.     . Turmeric 450 MG CAPS Take 450 mg by mouth daily.    Marland Kitchen venlafaxine XR (EFFEXOR XR) 75 MG 24 hr capsule Take 3 capsules (225 mg total) by mouth daily with breakfast. 270 capsule 2  . vitamin E 400 UNIT capsule Take 400 Units daily by mouth.     No current facility-administered medications for this visit.     Musculoskeletal: Strength & Muscle Tone: within normal limits Gait & Station: normal Patient leans: N/A  Psychiatric Specialty Exam: Review of Systems  Psychiatric/Behavioral: The patient is nervous/anxious.   All other systems reviewed and are negative.   There were no vitals taken for this visit.There is no height or weight on file to calculate BMI.  General Appearance: Casual and Fairly Groomed  Eye Contact:  Good  Speech:  Clear and Coherent  Volume:  Normal  Mood:  Anxious and Euthymic  Affect:  NA  Thought Process:  Goal Directed  Orientation:  Full (Time, Place, and Person)  Thought Content: Rumination   Suicidal Thoughts:  No  Homicidal Thoughts:  No  Memory:  Immediate;   Good Recent;   Good Remote;   Fair  Judgement:  Good  Insight:  Good  Psychomotor Activity:  Normal   Concentration:  Concentration: Good and Attention Span: Good  Recall:  Good  Fund of Knowledge: Good  Language: Good  Akathisia:  No  Handed:  Right  AIMS (if indicated): not done  Assets:  Communication Skills Desire for Improvement Physical Health Resilience Social Support Talents/Skills  ADL's:  Intact  Cognition: WNL  Sleep:  Fair   Screenings: AUDIT   Flowsheet Row Admission (Discharged) from 03/18/2014 in [facility]  Alcohol Use Disorder Identification Test Final Score (AUDIT) 0    GAD-7   Flowsheet Row Counselor from 06/20/2018 in [facility]  Total GAD-7 Score 16    PHQ2-9   Flowsheet Row Counselor from 06/20/2018 in [facility]  PHQ-2 Total Score 3  PHQ-9 Total Score 14       Assessment and Plan: This patient is a 70 year old female with a history depression anxiety and possible bipolar disorder.  She seems to be fairly stable but I urged her to take the full doses of medication.  She will continue Ativan 1 mg 3 times daily as needed for anxiety, Lamictal 100 mg twice daily for mood stabilization and Effexor XR 225 mg  daily for depression.  She will return to see me in 3 months   Levonne Spiller, MD 09/28/2020, 1:18 PM

## 2020-10-03 DIAGNOSIS — G894 Chronic pain syndrome: Secondary | ICD-10-CM | POA: Diagnosis not present

## 2020-10-03 DIAGNOSIS — E7849 Other hyperlipidemia: Secondary | ICD-10-CM | POA: Diagnosis not present

## 2020-10-03 DIAGNOSIS — I10 Essential (primary) hypertension: Secondary | ICD-10-CM | POA: Diagnosis not present

## 2020-10-07 ENCOUNTER — Other Ambulatory Visit: Payer: Self-pay

## 2020-10-07 ENCOUNTER — Ambulatory Visit (INDEPENDENT_AMBULATORY_CARE_PROVIDER_SITE_OTHER): Payer: Medicare Other | Admitting: Psychiatry

## 2020-10-07 DIAGNOSIS — F411 Generalized anxiety disorder: Secondary | ICD-10-CM | POA: Diagnosis not present

## 2020-10-07 DIAGNOSIS — F332 Major depressive disorder, recurrent severe without psychotic features: Secondary | ICD-10-CM

## 2020-10-07 NOTE — Progress Notes (Signed)
Virtual Visit via Telephone Note  I connected with Lauren [name] on 10/07/20 at 1:08 PM EST  by telephone and verified that I am speaking with the correct person using two identifiers.  Location: Patient: Home Provider: [place] office    I discussed the limitations, risks, security and privacy concerns of performing an evaluation and management service by telephone and the availability of in person appointments. I also discussed with the patient that there may be a patient responsible charge related to this service. The patient expressed understanding and agreed to proceed.   I provided 42 minutes of non-face-to-face time during this encounter.   [name], LCSW      THERAPIST PROGRESS NOTE            Session Time: Wednesday 10/07/2020 1:08 PM - 1:50 PM   Participation Level: Active  Behavioral Response: CasualAlertAngry, Anxious and Depressed,  Type of Therapy: Individual Therapy  Treatment Goals addressed: Patient states wanting to be more positive, complete tasks, sand improve organizational skills in adjusting to life without her husband/appropriately grieve loss and return to  previous adaptive level of functioning  Interventions: CBT and Supportive  Summary: Lauren [name] is a 70 y.o. female who presents who is referred for services due to stress, anxiety, and depression. She has participated in therapy intermittently for past 25 years. She reports 3 psychiatric hospitalizations with the last one occuring 4 years ago at [facility].   She reports increased stress related to husband's medical condition as he fell, broke his hip, and was placed in a nursing home.  Patient reports bleeding self for husband falling last year.  She reports having a hard time coping.  Symptoms include shame, blame, guilt, ruminating thoughts, feeling overwhelmed, depressed mood, excessive worry, and anxiety.  Patient last was seen via virtual visit about 2-3  weeks ago.  She continues to experience grief and loss issues regarding the death of her husband in 21-Mar-2020.  She expresses increased acceptance of husband's death.  However, she continues to express anxiety and frustration about managing her household and finances without husband.  She is particularly upset today as house is in need of several repairs and patient is frustrated she had to use some of the money she saved for this on unexpected expenses for the past 2 months.  She expresses frustration her income has decreased since husband's death but reports she does receive enough money to maintain household and have savings.  She also continues to miss her husband's advice and reassurance regarding decisions.  Patient maintains involvement in activity and socialization.  However, she has reduced some involvement due to concerns about COVID-19.   Suicidal/Homicidal: Nowithout intent/plan  Therapist Response: reviewed symptoms, praised and reinforced forced patient's continued  involvement in activity and socialization, discussed stressors, facilitated expression of thoughts and feelings, validated feelings, assisted patient identify/challenge/and replace unhelpful thoughts about her ability to manage household and finances with more helpful thoughts. assisted patient with problem solving regarding planning household repairs, developed plan with patient to make a list of household repairs/projects, prioritize the list, and develop a tentative time line, bring information to next session.              Plan: Return again in 2 weeks.  Diagnosis: Axis I: MDD, Recurrent, GAD

## 2020-10-19 ENCOUNTER — Ambulatory Visit (HOSPITAL_COMMUNITY)
Admission: RE | Admit: 2020-10-19 | Discharge: 2020-10-19 | Disposition: A | Discharge status: Home / Self Care | Payer: Medicare Other | Source: Ambulatory Visit | Attending: Family Medicine | Admitting: Family Medicine

## 2020-10-19 ENCOUNTER — Other Ambulatory Visit (HOSPITAL_COMMUNITY): Payer: Self-pay | Admitting: Family Medicine

## 2020-10-19 ENCOUNTER — Other Ambulatory Visit: Payer: Self-pay

## 2020-10-19 DIAGNOSIS — J4520 Mild intermittent asthma, uncomplicated: Secondary | ICD-10-CM

## 2020-10-19 DIAGNOSIS — I10 Essential (primary) hypertension: Secondary | ICD-10-CM | POA: Diagnosis not present

## 2020-10-19 DIAGNOSIS — Z1231 Encounter for screening mammogram for malignant neoplasm of breast: Secondary | ICD-10-CM | POA: Diagnosis not present

## 2020-10-19 DIAGNOSIS — R0602 Shortness of breath: Secondary | ICD-10-CM | POA: Diagnosis not present

## 2020-10-19 DIAGNOSIS — E782 Mixed hyperlipidemia: Secondary | ICD-10-CM | POA: Diagnosis not present

## 2020-10-21 ENCOUNTER — Other Ambulatory Visit: Payer: Self-pay

## 2020-10-21 ENCOUNTER — Ambulatory Visit (INDEPENDENT_AMBULATORY_CARE_PROVIDER_SITE_OTHER): Payer: Medicare Other | Admitting: Psychiatry

## 2020-10-21 DIAGNOSIS — F411 Generalized anxiety disorder: Secondary | ICD-10-CM

## 2020-10-21 DIAGNOSIS — F332 Major depressive disorder, recurrent severe without psychotic features: Secondary | ICD-10-CM | POA: Diagnosis not present

## 2020-10-21 NOTE — Progress Notes (Signed)
Virtual Visit via Telephone Note  I connected with Lauren [name] on 10/21/20 at 1:10 PM EST  by telephone and verified that I am speaking with the correct person using two identifiers.  Location: Patient: Home Provider: [place] office    I discussed the limitations, risks, security and privacy concerns of performing an evaluation and management service by telephone and the availability of in person appointments. I also discussed with the patient that there may be a patient responsible charge related to this service. The patient expressed understanding and agreed to proceed.          I provided 46  minutes of non-face-to-face time during this encounter.   [name], LCSW       THERAPIST PROGRESS NOTE            Session Time: Wednesday 10/21/2020 1:10 PM -1:56 PM   Participation Level: Active  Behavioral Response: CasualAlert/less anxious, less depressed   Type of Therapy: Individual Therapy  Treatment Goals addressed: Patient states wanting to be more positive, complete tasks, sand improve organizational skills in adjusting to life without her husband/appropriately grieve loss and return to  previous adaptive level of functioning  Interventions: CBT and Supportive  Summary: Lauren [name] is a 70 y.o. female who presents who is referred for services due to stress, anxiety, and depression. She has participated in therapy intermittently for past 25 years. She reports 3 psychiatric hospitalizations with the last one occuring 4 years ago at [facility].   She reports increased stress related to husband's medical condition as he fell, broke his hip, and was placed in a nursing home.  Patient reports bleeding self for husband falling last year.  She reports having a hard time coping.  Symptoms include shame, blame, guilt, ruminating thoughts, feeling overwhelmed, depressed mood, excessive worry, and anxiety.  Patient last was seen via virtual  visit about 2-3 weeks ago.  She experiences moderate symptoms of anxiety and depression.  She reports coping better with the loss of her husband.  She has maintained social involvement and reports recently enjoying visiting with her neighbor to watch a ball game.  Patient reports completing plan developed last session to make and prioritize a list of household projects/repairs with a timeline.  She reports already completing 2 of the items on her list.  She  expresses increased confidence in her ability to manage her household and finances.increased confidence in managing her household and finances.  This has resulted in decreased anxiety per her report.  Patient also reports she has been using daily planning along with the activity menu provided in previous sessions to try to have balance between responsibilities and involvement in pleasurable activities.   Suicidal/Homicidal: Nowithout intent/plan  Therapist Response: reviewed symptoms, administered pH Q tool and 9 with C-SS RS screen/GAD-7, praised and reinforced forced patient's continued  involvement in activity and socialization, praised and reinforced patient's implementation of plan developed last session, discussed effects, assisted patient identify ways to maintain consistent efforts with the use of daily planning            Plan: Return again in 2 weeks.  Diagnosis: Axis I: MDD, Recurrent, GAD

## 2020-11-02 DIAGNOSIS — G894 Chronic pain syndrome: Secondary | ICD-10-CM | POA: Diagnosis not present

## 2020-11-02 DIAGNOSIS — E7849 Other hyperlipidemia: Secondary | ICD-10-CM | POA: Diagnosis not present

## 2020-11-02 DIAGNOSIS — I10 Essential (primary) hypertension: Secondary | ICD-10-CM | POA: Diagnosis not present

## 2020-11-03 ENCOUNTER — Ambulatory Visit (HOSPITAL_COMMUNITY)
Admission: RE | Admit: 2020-11-03 | Discharge: 2020-11-03 | Disposition: A | Discharge status: Home / Self Care | Payer: Medicare Other | Source: Ambulatory Visit | Attending: Family Medicine | Admitting: Family Medicine

## 2020-11-03 ENCOUNTER — Other Ambulatory Visit: Payer: Self-pay

## 2020-11-03 DIAGNOSIS — J4520 Mild intermittent asthma, uncomplicated: Secondary | ICD-10-CM

## 2020-11-03 DIAGNOSIS — J45909 Unspecified asthma, uncomplicated: Secondary | ICD-10-CM | POA: Diagnosis not present

## 2020-11-04 ENCOUNTER — Ambulatory Visit (INDEPENDENT_AMBULATORY_CARE_PROVIDER_SITE_OTHER): Payer: Medicare Other | Admitting: Psychiatry

## 2020-11-04 DIAGNOSIS — F332 Major depressive disorder, recurrent severe without psychotic features: Secondary | ICD-10-CM
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
## 2020-11-05 ENCOUNTER — Institutional Professional Consult (permissible substitution): Payer: Medicare Other | Admitting: Pulmonary Disease

## 2020-11-09 ENCOUNTER — Institutional Professional Consult (permissible substitution): Payer: Medicare Other | Admitting: Internal Medicine

## 2020-11-10 DIAGNOSIS — M79675 Pain in left toe(s): Secondary | ICD-10-CM | POA: Diagnosis not present

## 2020-11-10 DIAGNOSIS — S92335A Nondisplaced fracture of third metatarsal bone, left foot, initial encounter for closed fracture: Secondary | ICD-10-CM | POA: Diagnosis not present

## 2020-11-10 DIAGNOSIS — E1140 Type 2 diabetes mellitus with diabetic neuropathy, unspecified: Secondary | ICD-10-CM | POA: Diagnosis not present

## 2020-11-10 DIAGNOSIS — M79672 Pain in left foot: Secondary | ICD-10-CM | POA: Diagnosis not present

## 2020-11-16 NOTE — Progress Notes (Signed)
Cardiology Office Note   Date:  11/27/2020   ID:  Lauren [name], DOB Mar 23, 1951, MRN [id number]  PCP:  Caren Macadam, MD  Cardiologist:   Jenkins Rouge, MD   No chief complaint on file.     History of Present Illness: Lauren [name] is a 70 y.o. female who presents for consultation regarding exertional dyspnea. Referred by Dr Gerarda Fraction Previous w/u for same benign. Normal echo  and myovue in 2019 Also had normal echo 09/25/19 CXR done 11/03/20 NAD She quit smoking in 83   She is on disability for fibromyalgia. She sees behavioral health for depression Previous ETOH abuse sober over 26 years Depression worse last year as dog died in 01-06-2023 and husband in 07-Apr-2023 Currently on Ativan, Lamictal and Effexor   She has complained of RLE pain with LE varicosities that are superficial Mild swelling  She is to have a swallowing study post EGD not clear why she has cyclic cough       Past Medical History:  Diagnosis Date  . Anxiety   . Chronic pain   . COPD (chronic obstructive pulmonary disease) ([place])   . Depression   . Fibromyalgia   . Fibromyalgia   . GERD (gastroesophageal reflux disease)   . Headache   . Hepatitis C   . History of blood transfusion   . History of bronchitis   . IBS (irritable bowel syndrome)   . Osteoarthritis   . Pneumonia   . Prediabetes   . Shortness of breath dyspnea    allergies; increased pain;   . Vertigo   . Wears glasses     Past Surgical History:  Procedure Laterality Date  . BIOPSY  09/16/2020   Procedure: BIOPSY;  Surgeon: Harvel Quale, MD;  [facility];  Service: Gastroenterology;;  . CATARACT EXTRACTION W/PHACO Right 08/01/2017   Procedure: CATARACT EXTRACTION PHACO AND INTRAOCULAR LENS PLACEMENT RIGHT EYE;  Surgeon: Rutherford Guys, MD;  Location: [facility];  Service: Ophthalmology;  Laterality: Right;  CDE: 3.30  . CATARACT EXTRACTION W/PHACO Left 08/15/2017   Procedure: CATARACT EXTRACTION PHACO AND INTRAOCULAR LENS  PLACEMENT (IOC);  Surgeon: Rutherford Guys, MD;  Location: [facility];  Service: Ophthalmology;  Laterality: Left;  CDE: 4.00  . CHOLECYSTECTOMY    . COLONOSCOPY    . COLONOSCOPY N/A 10/23/2014   Procedure: COLONOSCOPY;  Surgeon: Rogene Houston, MD;  [facility];  Service: Endoscopy;  Laterality: N/A;  1030  . ESOPHAGOGASTRODUODENOSCOPY N/A 10/23/2014   Procedure: ESOPHAGOGASTRODUODENOSCOPY (EGD);  Surgeon: Rogene Houston, MD;  [facility];  Service: Endoscopy;  Laterality: N/A;  . ESOPHAGOGASTRODUODENOSCOPY (EGD) WITH PROPOFOL N/A 09/16/2020   Procedure: ESOPHAGOGASTRODUODENOSCOPY (EGD) WITH PROPOFOL;  Surgeon: Harvel Quale, MD;  [facility];  Service: Gastroenterology;  Laterality: N/A;  7:30  . KNEE ARTHROSCOPY Left 10/21/2015   Procedure: ARTHROSCOPY LEFT KNEE WITH MENICAL DEBRIDEMENT, Chondroplasty;  Surgeon: Gaynelle Arabian, MD;  Location: [facility];  Service: Orthopedics;  Laterality: Left;  Marland Kitchen MANDIBLE SURGERY  09/06/1979  . ORIF WRIST FRACTURE Right 07/23/2013   Procedure: OPEN REDUCTION INTERNAL FIXATION (ORIF) WRIST FRACTURE;  Surgeon: Roseanne Kaufman, MD;  Location: [place];  Service: Orthopedics;  Laterality: Right;  . SAVORY DILATION  09/16/2020   Procedure: SAVORY DILATION;  Surgeon: Montez Morita, Quillian Quince, MD;  [facility];  Service: Gastroenterology;;  . TONSILLECTOMY    . TOTAL KNEE ARTHROPLASTY     2011 rt knee  . TOTAL KNEE  ARTHROPLASTY Left 04/16/2018   Procedure: LEFT TOTAL KNEE ARTHROPLASTY;  Surgeon: Gaynelle Arabian, MD;  Location: [facility];  Service: Orthopedics;  Laterality: Left;  . UPPER GASTROINTESTINAL ENDOSCOPY       Current Outpatient Medications  Medication Sig Dispense Refill  . albuterol (PROAIR HFA) 108 (90 BASE) MCG/ACT inhaler Inhale 2 puffs into the lungs every 4 (four) hours as needed for wheezing or shortness of breath.    Marland Kitchen aspirin EC 81 MG tablet Take 81 mg by mouth daily. Swallow whole.    . Biotin w/  Vitamins C & E (HAIR/SKIN/NAILS PO) Take 2 tablets by mouth daily.    . calcium-vitamin D (OSCAL WITH D) 500-200 MG-UNIT per tablet Take 1 tablet by mouth 2 (two) times daily. For low calcium    . Cholecalciferol (VITAMIN D-3) 25 MCG (1000 UT) CAPS Take 1 capsule by mouth daily.    . Cyanocobalamin (B-12) 2000 MCG TABS Take 2,000 mcg by mouth daily.    Marland Kitchen dexlansoprazole (DEXILANT) 60 MG capsule Take 1 capsule (60 mg total) by mouth daily. 90 capsule 3  . diclofenac sodium (VOLTAREN) 1 % GEL Apply 2 g topically 4 (four) times daily as needed (For pain.).     Marland Kitchen dicyclomine (BENTYL) 20 MG tablet Take 20 mg by mouth 4 (four) times daily as needed.    . diphenoxylate-atropine (LOMOTIL) 2.5-0.025 MG tablet Take 1 tablet by mouth 3 (three) times daily as needed. (Patient taking differently: Take 1 tablet by mouth 3 (three) times daily as needed for diarrhea or loose stools.) 60 tablet 6  . gabapentin (NEURONTIN) 100 MG capsule Take 200-300 mg by mouth See admin instructions. Take 200mg  by mouth every day in the morning and take 300mg  by mouth at bedtime    . lamoTRIgine (LAMICTAL) 100 MG tablet Take 1 tablet (100 mg total) by mouth 2 (two) times daily. 180 tablet 2  . lisinopril (ZESTRIL) 5 MG tablet Take 5 mg by mouth at bedtime.     Marland Kitchen LORazepam (ATIVAN) 1 MG tablet Take 1 tablet (1 mg total) by mouth 3 (three) times daily as needed for anxiety. 90 tablet 2  . metroNIDAZOLE (METROGEL) 1 % gel Apply 1 application topically daily as needed.    . Multiple Vitamins-Minerals (MULTIVITAMIN WITH MINERALS) tablet Take 1 tablet daily by mouth.    . Omega-3 Fatty Acids (FISH OIL) 1000 MG CPDR Take 1 capsule by mouth daily.    Marland Kitchen OVER THE COUNTER MEDICATION Take 1 tablet by mouth daily at 8 pm. Calcium, Magnesium, and Zinc once per day.    Marland Kitchen OVER THE COUNTER MEDICATION Take 1 tablet by mouth daily. Garlic Extract 0175 mg once per day.    Marland Kitchen OVER THE COUNTER MEDICATION Take 1 tablet by mouth daily. Vitamin D3 25 mcg  (1000 IU) once per day.    . pantoprazole (PROTONIX) 40 MG tablet Take 40 mg by mouth 2 (two) times daily.    . pravastatin (PRAVACHOL) 80 MG tablet Take 80 mg at bedtime by mouth.     . Turmeric 450 MG CAPS Take 450 mg by mouth daily.    Marland Kitchen venlafaxine XR (EFFEXOR XR) 75 MG 24 hr capsule Take 3 capsules (225 mg total) by mouth daily with breakfast. 270 capsule 2  . vitamin E 400 UNIT capsule Take 400 Units daily by mouth.    . nortriptyline (PAMELOR) 10 MG capsule Take 1 capsule (10 mg total) by mouth at bedtime. 30 capsule 2   No current  facility-administered medications for this visit.    Allergies:   Elavil [amitriptyline], Flagyl [metronidazole], Vistaril [hydroxyzine hcl], Geodon [ziprasidone hydrochloride], Lactose intolerance (gi), Latex, and Other    Social History:  The patient  reports that she quit smoking about 38 years ago. Her smoking use included cigarettes. She has a 30.00 pack-year smoking history. She has never used smokeless tobacco. She reports that she does not drink alcohol and does not use drugs.   Family History:  The patient's family history includes Alcohol abuse in her cousin, father, maternal grandfather, maternal grandmother, paternal grandfather, paternal grandmother, and paternal uncle; Anxiety disorder in her maternal uncle and paternal uncle; Breast cancer in her mother; Depression in her father; Diabetes in her brother, brother, father, and paternal grandmother; Healthy in her brother; Heart attack in her father; Heart disease in her brother; Heart failure in her brother; High Cholesterol in her father; Hypertension in her father, maternal grandmother, and mother; Irritable bowel syndrome in her mother; Sarcoidosis in her brother; Sexual abuse in her mother; Stroke in her father and mother.    ROS:  Please see the history of present illness.   Otherwise, review of systems are positive for none.   All other systems are reviewed and negative.    PHYSICAL  EXAM: VS:  BP 136/82   Pulse 89   Ht 5\' 6"  (1.676 m)   Wt 73.5 kg   SpO2 92%   BMI 26.15 kg/m  , BMI Body mass index is 26.15 kg/m. Affect appropriate Healthy:  appears stated age 66: normal Neck supple with no adenopathy JVP normal no bruits no thyromegaly Lungs clear with no wheezing and good diaphragmatic motion Heart:  S1/S2 no murmur, no rub, gallop or click PMI normal Abdomen: benighn, BS positve, no tenderness, no AAA no bruit.  No HSM or HJR Distal pulses intact with no bruits Trace right LE edema with abundant superficial varicosities  Neuro non-focal Skin warm and dry No muscular weakness    EKG:  01/26/17 SR rate 78 normal  02/22/18 SR rate 84 inferolateral Q waves 11/27/2020 NSR rate 89 normal    Recent Labs: No results found for requested labs within last 8760 hours.    Lipid Panel No results found for: CHOL, TRIG, HDL, CHOLHDL, VLDL, LDLCALC, LDLDIRECT    Wt Readings from Last 3 Encounters:  11/27/20 73.5 kg  09/16/20 71.7 kg  08/06/20 71.2 kg      Other studies Reviewed: Additional studies/ records that were reviewed today include:  . CXR 11/03/20 Echo 09/25/19 Myovue 2019    ASSESSMENT AND PLAN:  1.  Dyspnea no obvious cardiac etiology echo 09/25/19 normal  CXR normal 11/03/20 observe 2. HLD continue statin labs with primary  3. Depression/Anxiety on Effexor Lamictal and Xanax  4. Fibromyalgia  ? Etiology for disability  5. HTN:  On ACE improved  6. GI: with GERD/Dysphagia EGD 09/16/20 with no pathology but "entire esophagus dilated" small Hiatal hernia Rx protonix difficulty affording dexilant  7. LE varicosities with pain in RLE f/u duplex r/o DVT   LE venous duplex F/U in a year   Signed, Jenkins Rouge, MD  11/27/2020 4:25 PM    [facility] [place], [place], [place]  [address] Phone: [phone]; Fax: [phone]

## 2020-11-18 ENCOUNTER — Ambulatory Visit (HOSPITAL_COMMUNITY): Payer: Medicare Other | Admitting: Psychiatry

## 2020-11-18 ENCOUNTER — Other Ambulatory Visit: Payer: Self-pay

## 2020-11-27 ENCOUNTER — Other Ambulatory Visit: Payer: Self-pay

## 2020-11-27 ENCOUNTER — Encounter: Payer: Self-pay | Admitting: Cardiovascular Disease

## 2020-11-27 ENCOUNTER — Ambulatory Visit (INDEPENDENT_AMBULATORY_CARE_PROVIDER_SITE_OTHER): Payer: Medicare Other | Admitting: Cardiovascular Disease

## 2020-11-27 VITALS — BP 136/82 | HR 89 | Ht 66.0 in | Wt 162.0 lb

## 2020-11-27 DIAGNOSIS — M79605 Pain in left leg: Secondary | ICD-10-CM | POA: Diagnosis not present

## 2020-11-27 DIAGNOSIS — M79604 Pain in right leg: Secondary | ICD-10-CM | POA: Diagnosis not present

## 2020-11-27 DIAGNOSIS — R0600 Dyspnea, unspecified: Secondary | ICD-10-CM | POA: Diagnosis not present

## 2020-11-27 DIAGNOSIS — M7989 Other specified soft tissue disorders: Secondary | ICD-10-CM | POA: Diagnosis not present

## 2020-11-27 NOTE — Patient Instructions (Addendum)
Medication Instructions:  *If you need a refill on your cardiac medications before your next appointment, please call your pharmacy*  Lab Work: If you have labs (blood work) drawn today and your tests are completely normal, you will receive your results only by: Marland Kitchen MyChart Message (if you have MyChart) OR . A paper copy in the mail If you have any lab test that is abnormal or we need to change your treatment, we will call you to review the results.  Testing/Procedures: Your physician has requested that you have a lower extremity venous duplex. This test is an ultrasound of the veins in the legs. It looks at venous blood flow that carries blood from the heart to the legs. Allow one hour for a Lower Venous exam. There are no restrictions or special instructions.  Follow-Up: At [facility], you and your health needs are our priority.  As part of our continuing mission to provide you with exceptional heart care, we have created designated Provider Care Teams.  These Care Teams include your primary Cardiologist (physician) and Advanced Practice Providers (APPs -  Physician Assistants and Nurse Practitioners) who all work together to provide you with the care you need, when you need it.  We recommend signing up for the patient portal called "MyChart".  Sign up information is provided on this After Visit Summary.  MyChart is used to connect with patients for Virtual Visits (Telemedicine).  Patients are able to view lab/test results, encounter notes, upcoming appointments, etc.  Non-urgent messages can be sent to your provider as well.   To learn more about what you can do with MyChart, go to [email or web address].    Your next appointment:   1 year   The format for your next appointment:   In Person  Provider:   You may see Jenkins Rouge, MD or one of the following Advanced Practice Providers on your designated Care Team:    Kathyrn Drown, NP

## 2020-11-30 ENCOUNTER — Other Ambulatory Visit: Payer: Self-pay

## 2020-11-30 ENCOUNTER — Ambulatory Visit (HOSPITAL_COMMUNITY)
Admission: RE | Admit: 2020-11-30 | Discharge: 2020-11-30 | Disposition: A | Discharge status: Home / Self Care | Payer: Medicare Other | Source: Ambulatory Visit | Attending: Cardiology | Admitting: Cardiology

## 2020-11-30 DIAGNOSIS — M79604 Pain in right leg: Secondary | ICD-10-CM | POA: Insufficient documentation

## 2020-11-30 DIAGNOSIS — M79605 Pain in left leg: Secondary | ICD-10-CM | POA: Diagnosis not present

## 2020-11-30 DIAGNOSIS — M7989 Other specified soft tissue disorders: Secondary | ICD-10-CM | POA: Diagnosis not present

## 2020-11-30 DIAGNOSIS — R0600 Dyspnea, unspecified: Secondary | ICD-10-CM | POA: Insufficient documentation

## 2020-12-01 DIAGNOSIS — S92335D Nondisplaced fracture of third metatarsal bone, left foot, subsequent encounter for fracture with routine healing: Secondary | ICD-10-CM | POA: Diagnosis not present

## 2020-12-01 DIAGNOSIS — M79675 Pain in left toe(s): Secondary | ICD-10-CM | POA: Diagnosis not present

## 2020-12-01 DIAGNOSIS — M79672 Pain in left foot: Secondary | ICD-10-CM | POA: Diagnosis not present

## 2020-12-02 ENCOUNTER — Ambulatory Visit (INDEPENDENT_AMBULATORY_CARE_PROVIDER_SITE_OTHER): Payer: Medicare Other | Admitting: Psychiatry

## 2020-12-02 ENCOUNTER — Other Ambulatory Visit: Payer: Self-pay

## 2020-12-02 DIAGNOSIS — F332 Major depressive disorder, recurrent severe without psychotic features: Secondary | ICD-10-CM

## 2020-12-02 DIAGNOSIS — E7849 Other hyperlipidemia: Secondary | ICD-10-CM | POA: Diagnosis not present

## 2020-12-02 DIAGNOSIS — G894 Chronic pain syndrome: Secondary | ICD-10-CM | POA: Diagnosis not present

## 2020-12-02 DIAGNOSIS — I10 Essential (primary) hypertension: Secondary | ICD-10-CM | POA: Diagnosis not present

## 2020-12-02 NOTE — Progress Notes (Signed)
Virtual Visit via Telephone Note  I connected with Lauren [name] on 12/02/20 at 2:10 PM EDT  by telephone and verified that I am speaking with the correct person using two identifiers.  Location: Patient: Home Provider: [place] office    I discussed the limitations, risks, security and privacy concerns of performing an evaluation and management service by telephone and the availability of in person appointments. I also discussed with the patient that there may be a patient responsible charge related to this service. The patient expressed understanding and agreed to proceed.  I provided 45 minutes of non-face-to-face time during this encounter.   Lauren [name], Lauren [name]             THERAPIST PROGRESS NOTE            Session Time: Wednesday 12/02/2020 2:10 PM - 2:55 PM  Participation Level: Active  Behavioral Response: CasualAlert/anxious, depressed   Type of Therapy: Individual Therapy  Treatment Goals addressed: Patient states wanting to be more positive, complete tasks, sand improve organizational skills in adjusting to life without her husband/appropriately grieve loss and return to  previous adaptive level of functioning  Interventions: CBT and Supportive  Summary: MARIJOSE [name] is a 70 y.o. female who presents who is referred for services due to stress, anxiety, and depression. She has participated in therapy intermittently for past 25 years. She reports 3 psychiatric hospitalizations with the last one occuring 4 years ago at [facility].   She reports increased stress related to husband's medical condition as he fell, broke his hip, and was placed in a nursing home.  Patient reports bleeding self for husband falling last year.  She reports having a hard time coping.  Symptoms include shame, blame, guilt, ruminating thoughts, feeling overwhelmed, depressed mood, excessive worry, and anxiety.  Patient last was seen via virtual visit about 4   weeks ago.  She reports improved mood and increased involvement in activities since last session.  She has increased social involvement including going out to dinner with a group of ladies once a month and recently attending a church event with another friend.  She also continues to attend her [place] meetings .  She also has been enjoying this years basketball tournament.  It triggers memories of her husband as they used to wash this together but she is managing this well as she has made another friend who enjoys basketball as well and texts patient during the games.  Patient also has scheduled and completed medical appointments.  She has taken care of household and car repairs.  She expresses increased confidence in her ability to manage responsibilities without her husband.  She does express some concern and anxiety about the relationship with her youngest brother as they do not have as much contact as they had prior to her husband's death.  She is assuming brother has negative thoughts about patient.  Suicidal/Homicidal: Nowithout intent/plan  Therapist Response: reviewed symptoms, patient reinforced patient's increased involvement in activities/increased socialization/completing tasks and appointments, discussed effects, discussed stressors, facilitated expression of thoughts and feelings, validated feelings, assisted patient identify/challenge/and replace negative assumptions about decreased contact from brother, assisted patient identify ways to use assertiveness skills to express concerns to brother, encouraged patient to maintain behavioral activation and socialization   Plan: Return again in 2 weeks.  Diagnosis: Axis I: MDD, Recurrent, GAD

## 2020-12-03 ENCOUNTER — Telehealth: Payer: Self-pay | Admitting: Cardiovascular Disease

## 2020-12-03 NOTE — Telephone Encounter (Signed)
Patient is returning call to discuss LE Venous results.

## 2020-12-03 NOTE — Telephone Encounter (Signed)
RN returned call to discuss results with patient. All questions answered appropriately.

## 2020-12-04 ENCOUNTER — Institutional Professional Consult (permissible substitution): Payer: Medicare Other | Admitting: Pulmonary Disease

## 2020-12-04 NOTE — Progress Notes (Signed)
Pt has been made aware of normal result and verbalized understanding.  jw

## 2020-12-18 DIAGNOSIS — R0989 Other specified symptoms and signs involving the circulatory and respiratory systems: Secondary | ICD-10-CM | POA: Diagnosis not present

## 2020-12-18 DIAGNOSIS — R059 Cough, unspecified: Secondary | ICD-10-CM | POA: Diagnosis not present

## 2020-12-18 DIAGNOSIS — R1310 Dysphagia, unspecified: Secondary | ICD-10-CM | POA: Diagnosis not present

## 2020-12-18 DIAGNOSIS — K2289 Other specified disease of esophagus: Secondary | ICD-10-CM | POA: Diagnosis not present

## 2020-12-18 DIAGNOSIS — R079 Chest pain, unspecified: Secondary | ICD-10-CM | POA: Diagnosis not present

## 2020-12-18 DIAGNOSIS — R1110 Vomiting, unspecified: Secondary | ICD-10-CM | POA: Diagnosis not present

## 2020-12-21 ENCOUNTER — Encounter (INDEPENDENT_AMBULATORY_CARE_PROVIDER_SITE_OTHER): Payer: Self-pay

## 2020-12-21 ENCOUNTER — Telehealth (INDEPENDENT_AMBULATORY_CARE_PROVIDER_SITE_OTHER): Payer: Self-pay | Admitting: Gastroenterology

## 2020-12-21 DIAGNOSIS — R1319 Other dysphagia: Secondary | ICD-10-CM

## 2020-12-21 NOTE — Telephone Encounter (Signed)
I reviewed the results of the esophageal manometry patient had performed 12/18/2020 which showed increased IRP of 19.3 but overall there was adequate peristalsis in all swallows, 12% of swallows failed and 12% were weak, 24% swallows were ineffective.  Based on these findings, there is a possibility of EGJ outflow obstruction, which will need to be further confirmed with timed barium swallow.  Patient understood and agreed.  The patient is currently taking Lomotil as needed, but states that she has taken maybe once a week.  It is unlikely the opiate component is causing significant dysmotility.  I explained to her that if her symptoms persist and barium swallow confirms the diagnosis, she may need to have a repeat EGD with balloon dilation at the EGJ up to 20 mm but also will need to consider POEM if no improvement.  Darius Bump, can you please schedule an timed barium swallow? Dx: dysphagia. Room: Thanks,   [name], MD Gastroenterology and Hepatology [facility]

## 2020-12-22 DIAGNOSIS — M7752 Other enthesopathy of left foot: Secondary | ICD-10-CM | POA: Diagnosis not present

## 2020-12-22 DIAGNOSIS — M1990 Unspecified osteoarthritis, unspecified site: Secondary | ICD-10-CM | POA: Diagnosis not present

## 2020-12-22 DIAGNOSIS — M79672 Pain in left foot: Secondary | ICD-10-CM | POA: Diagnosis not present

## 2020-12-23 NOTE — Telephone Encounter (Signed)
Lauren [name] is scheduled for BPE on 12/28/20 at 8:30 patient knows to arrive at 8:15 NPO 3 hrs prior

## 2020-12-23 NOTE — Telephone Encounter (Signed)
Thanks

## 2020-12-24 ENCOUNTER — Ambulatory Visit (INDEPENDENT_AMBULATORY_CARE_PROVIDER_SITE_OTHER): Payer: Medicare Other | Admitting: Psychiatry

## 2020-12-24 ENCOUNTER — Other Ambulatory Visit: Payer: Self-pay

## 2020-12-24 DIAGNOSIS — F411 Generalized anxiety disorder: Secondary | ICD-10-CM

## 2020-12-24 DIAGNOSIS — F332 Major depressive disorder, recurrent severe without psychotic features: Secondary | ICD-10-CM | POA: Diagnosis not present

## 2020-12-24 NOTE — Progress Notes (Signed)
Virtual Visit via Telephone Note  I connected with Lauren [name] on 12/24/20 at 2:09 PM EDT  by telephone and verified that I am speaking with the correct person using two identifiers.  Location: Patient: Home Provider: [place] office    I discussed the limitations, risks, security and privacy concerns of performing an evaluation and management service by telephone and the availability of in person appointments. I also discussed with the patient that there may be a patient responsible charge related to this service. The patient expressed understanding and agreed to proceed.   I provided 37 minutes of non-face-to-face time during this encounter.     [name], LCSW             THERAPIST PROGRESS NOTE            Session Time: Thursday 12/24/2020 2:09 PM - 2:46 PM   Participation Level: Active  Behavioral Response: CasualAlert/euthymic   Type of Therapy: Individual Therapy  Treatment Goals addressed: Patient states wanting to be more positive, complete tasks, and improve organizational skills in adjusting to life without her husband/appropriately grieve loss and return to  previous adaptive level of functioning  Interventions: CBT and Supportive  Summary: Lauren [name] is a 70 y.o. female who presents who is referred for services due to stress, anxiety, and depression. She has participated in therapy intermittently for past 25 years. She reports 3 psychiatric hospitalizations with the last one occuring 4 years ago at [facility].   She reports increased stress related to husband's medical condition as he fell, broke his hip, and was placed in a nursing home.  Patient reports bleeding self for husband falling last year.  She reports having a hard time coping.  Symptoms include shame, blame, guilt, ruminating thoughts, feeling overwhelmed, depressed mood, excessive worry, and anxiety.  Patient last was seen via virtual visit about 3-4 weeks  ago.  She reports continued improved mood and continued  involvement in activities since last session.  She has maintained social involvement including going out to dinner with friends, celebrating her birthday, attending [place] meetings, and attending church. She also has been completing household projects. She continues to improve assertiveness skills and cites recent example. She continues to miss husband but is pleased with her efforts to cope.  She continues to express confidence in her ability to cope with life and manage responsibilities without her husband.  She does express some concerns she is overeating in the evenings.  Patient also is pleased she and her brother have reconnected and have resumed regular contact.   Suicidal/Homicidal: Nowithout intent/plan  Therapist Response: reviewed symptoms, praised and reinforced patient's continued involvement in activities/socialization/completing tasks/reconnecting with brother, discussed effects, reviewed treatment plan, discussed next steps for treatment, processed patient's feelings about termination, developed stepdown plan to termination to include 3-4 more sessions, will send patient early warning signs of depression in preparation for next session, also will send patient ABC behavioral analysis form in preparation for next session   Plan: Return again in 2 weeks.  Diagnosis: Axis I: MDD, Recurrent, GAD

## 2020-12-28 ENCOUNTER — Telehealth (INDEPENDENT_AMBULATORY_CARE_PROVIDER_SITE_OTHER): Payer: Medicare Other | Admitting: Psychiatry

## 2020-12-28 ENCOUNTER — Ambulatory Visit (HOSPITAL_COMMUNITY)
Admission: RE | Admit: 2020-12-28 | Discharge: 2020-12-28 | Disposition: A | Discharge status: Home / Self Care | Payer: Medicare Other | Source: Ambulatory Visit | Attending: Gastroenterology | Admitting: Gastroenterology

## 2020-12-28 ENCOUNTER — Encounter (HOSPITAL_COMMUNITY): Payer: Self-pay | Admitting: Psychiatry

## 2020-12-28 ENCOUNTER — Other Ambulatory Visit (INDEPENDENT_AMBULATORY_CARE_PROVIDER_SITE_OTHER): Payer: Self-pay | Admitting: Gastroenterology

## 2020-12-28 ENCOUNTER — Other Ambulatory Visit: Payer: Self-pay

## 2020-12-28 DIAGNOSIS — F411 Generalized anxiety disorder: Secondary | ICD-10-CM | POA: Diagnosis not present

## 2020-12-28 DIAGNOSIS — K224 Dyskinesia of esophagus: Secondary | ICD-10-CM | POA: Diagnosis not present

## 2020-12-28 DIAGNOSIS — F332 Major depressive disorder, recurrent severe without psychotic features: Secondary | ICD-10-CM | POA: Diagnosis not present

## 2020-12-28 DIAGNOSIS — R1319 Other dysphagia: Secondary | ICD-10-CM | POA: Diagnosis not present

## 2020-12-28 MED ORDER — LAMOTRIGINE 100 MG PO TABS: 100.0000 mg | ORAL_TABLET | Freq: Two times a day (BID) | ORAL | 2 refills | Status: DC

## 2020-12-28 MED ORDER — VENLAFAXINE HCL ER 75 MG PO CP24: 225.0000 mg | ORAL_CAPSULE | Freq: Every day | ORAL | 2 refills | Status: DC

## 2020-12-28 MED ORDER — LORAZEPAM 1 MG PO TABS: 1.0000 mg | ORAL_TABLET | Freq: Every day | ORAL | 2 refills | Status: DC

## 2020-12-28 NOTE — Progress Notes (Signed)
Virtual Visit via Telephone Note  I connected with Lauren [name] on 12/28/20 at  2:00 PM EDT by telephone and verified that I am speaking with the correct person using two identifiers.  Location: Patient: home Provider: home office   I discussed the limitations, risks, security and privacy concerns of performing an evaluation and management service by telephone and the availability of in person appointments. I also discussed with the patient that there may be a patient responsible charge related to this service. The patient expressed understanding and agreed to proceed.     I discussed the assessment and treatment plan with the patient. The patient was provided an opportunity to ask questions and all were answered. The patient agreed with the plan and demonstrated an understanding of the instructions.   The patient was advised to call back or seek an in-person evaluation if the symptoms worsen or if the condition fails to improve as anticipated.  I provided 15 minutes of non-face-to-face time during this encounter.   Levonne Spiller, MD  [facility] MD/PA/NP OP Progress Note  12/28/2020 2:26 PM Lauren [name]  MRN:  [id number]  Chief Complaint:  Chief Complaint    Anxiety; Depression; Follow-up     HPI: This patient is a70 year oldwidowedwhite female who lives alone in [place]. She has one stepchild. Her step son killed himself12years ago.Marland Kitchen He was an alcoholic. The patient is on disability for fibromyalgia  The patient has a long history of depression. She was last hospitalized in 2010 after she took too many pain pills and got confused. She use to drink heavily but has been sober 23 years and is very active in [employer] and church. For the most part she feels her mood is stable. She sleeping pretty well. Her energy is good. She has a hard time functioning without Ativan but she only usually takes one pill a day side think this is reasonable. She tried a higher dose of Cymbalta but it made  her manic and revved up  The patient returns for follow-up after 3 months.  She states that she is still very upset about her husband's death last summer.  It is hard for her not to think about things at night like what she could have done differently to help with etc.  The Ativan does help when she takes 1 mg at bedtime.  However she is worried about taking it because she finds that she is having more trouble with word finding and mixing up words and short-term memory loss.  I told her I could refer her for neuropsychological testing.  Her mood is generally fairly good with the medications.  She denies severe depression or suicidal ideation. Visit Diagnosis:    ICD-10-CM   1. Major depressive disorder, recurrent, severe without psychotic features ([place])  F33.2   2. Generalized anxiety disorder  F41.1     Past Psychiatric History: Long history of depression and mood instability she was last hospitalized about 6 years ago for suicidal ideation  Past Medical History:  Past Medical History:  Diagnosis Date  . Anxiety   . Chronic pain   . COPD (chronic obstructive pulmonary disease) ([place])   . Depression   . Fibromyalgia   . Fibromyalgia   . GERD (gastroesophageal reflux disease)   . Headache   . Hepatitis C   . History of blood transfusion   . History of bronchitis   . IBS (irritable bowel syndrome)   . Osteoarthritis   . Pneumonia   . Prediabetes   .  Shortness of breath dyspnea    allergies; increased pain;   . Vertigo   . Wears glasses     Past Surgical History:  Procedure Laterality Date  . BIOPSY  09/16/2020   Procedure: BIOPSY;  Surgeon: Harvel Quale, MD;  [facility];  Service: Gastroenterology;;  . CATARACT EXTRACTION W/PHACO Right 08/01/2017   Procedure: CATARACT EXTRACTION PHACO AND INTRAOCULAR LENS PLACEMENT RIGHT EYE;  Surgeon: Rutherford Guys, MD;  Location: [facility];  Service: Ophthalmology;  Laterality: Right;  CDE: 3.30  . CATARACT EXTRACTION  W/PHACO Left 08/15/2017   Procedure: CATARACT EXTRACTION PHACO AND INTRAOCULAR LENS PLACEMENT (IOC);  Surgeon: Rutherford Guys, MD;  Location: [facility];  Service: Ophthalmology;  Laterality: Left;  CDE: 4.00  . CHOLECYSTECTOMY    . COLONOSCOPY    . COLONOSCOPY N/A 10/23/2014   Procedure: COLONOSCOPY;  Surgeon: Rogene Houston, MD;  [facility];  Service: Endoscopy;  Laterality: N/A;  1030  . ESOPHAGOGASTRODUODENOSCOPY N/A 10/23/2014   Procedure: ESOPHAGOGASTRODUODENOSCOPY (EGD);  Surgeon: Rogene Houston, MD;  [facility];  Service: Endoscopy;  Laterality: N/A;  . ESOPHAGOGASTRODUODENOSCOPY (EGD) WITH PROPOFOL N/A 09/16/2020   Procedure: ESOPHAGOGASTRODUODENOSCOPY (EGD) WITH PROPOFOL;  Surgeon: Harvel Quale, MD;  [facility];  Service: Gastroenterology;  Laterality: N/A;  7:30  . KNEE ARTHROSCOPY Left 10/21/2015   Procedure: ARTHROSCOPY LEFT KNEE WITH MENICAL DEBRIDEMENT, Chondroplasty;  Surgeon: Gaynelle Arabian, MD;  Location: [facility];  Service: Orthopedics;  Laterality: Left;  Marland Kitchen MANDIBLE SURGERY  09/06/1979  . ORIF WRIST FRACTURE Right 07/23/2013   Procedure: OPEN REDUCTION INTERNAL FIXATION (ORIF) WRIST FRACTURE;  Surgeon: Roseanne Kaufman, MD;  Location: [place];  Service: Orthopedics;  Laterality: Right;  . SAVORY DILATION  09/16/2020   Procedure: SAVORY DILATION;  Surgeon: Montez Morita, Quillian Quince, MD;  [facility];  Service: Gastroenterology;;  . TONSILLECTOMY    . TOTAL KNEE ARTHROPLASTY     2011 rt knee  . TOTAL KNEE ARTHROPLASTY Left 04/16/2018   Procedure: LEFT TOTAL KNEE ARTHROPLASTY;  Surgeon: Gaynelle Arabian, MD;  [facility];  Service: Orthopedics;  Laterality: Left;  . UPPER GASTROINTESTINAL ENDOSCOPY      Family Psychiatric History: see below  Family History:  Family History  Problem Relation Age of Onset  . Alcohol abuse Father   . Diabetes Father   . Stroke Father   . Depression Father   . Hypertension Father   .  Heart attack Father   . High Cholesterol Father   . Alcohol abuse Maternal Grandfather   . Alcohol abuse Maternal Grandmother   . Hypertension Maternal Grandmother   . Alcohol abuse Paternal Grandfather   . Alcohol abuse Paternal Grandmother   . Diabetes Paternal Grandmother   . Stroke Mother   . Irritable bowel syndrome Mother   . Sexual abuse Mother   . Hypertension Mother   . Breast cancer Mother   . Diabetes Brother   . Healthy Brother   . Diabetes Brother   . Heart disease Brother   . Sarcoidosis Brother   . Heart failure Brother   . Anxiety disorder Paternal Uncle   . Alcohol abuse Paternal Uncle   . Alcohol abuse Cousin   . Anxiety disorder Maternal Uncle   . ADD / ADHD Neg Hx   . Bipolar disorder Neg Hx   . Dementia Neg Hx   . Drug abuse Neg Hx   . Paranoid behavior Neg Hx   . Schizophrenia Neg Hx   .  Seizures Neg Hx   . Physical abuse Neg Hx     Social History:  Social History   Socioeconomic History  . Marital status: Married    Spouse name: Not on file  . Number of children: Not on file  . Years of education: Not on file  . Highest education level: Not on file  Occupational History  . Not on file  Tobacco Use  . Smoking status: Former Smoker    Packs/day: 2.00    Years: 15.00    Pack years: 30.00    Types: Cigarettes    Quit date: 09/05/1982    Years since quitting: 38.3  . Smokeless tobacco: Never Used  Vaping Use  . Vaping Use: Never used  Substance and Sexual Activity  . Alcohol use: No    Alcohol/week: 0.0 standard drinks    Comment: history of alcoholism; pt states has been sober 28 years  . Drug use: No  . Sexual activity: Never  Other Topics Concern  . Not on file  Social History Narrative  . Not on file   Social Determinants of Health   Financial Resource Strain: Not on file  Food Insecurity: Not on file  Transportation Needs: Not on file  Physical Activity: Not on file  Stress: Not on file  Social Connections: Not on file     Allergies:  Allergies  Allergen Reactions  . Elavil [Amitriptyline] Other (See Comments)    Vivid dreams and almost suicidal  . Flagyl [Metronidazole] Itching  . Vistaril [Hydroxyzine Hcl] Other (See Comments)    Got higher than a kite on too much of this.  Earnestine Leys [Ziprasidone Hydrochloride] Other (See Comments)    Blacked out  . Lactose Intolerance (Gi) Other (See Comments)    GI upset  . Latex Other (See Comments)    Red at site  . Other Other (See Comments)    Metabolic Disorder Labs: Lab Results  Component Value Date   HGBA1C 6.1 (H) 10/13/2015   MPG 128 10/13/2015   No results found for: PROLACTIN No results found for: CHOL, TRIG, HDL, CHOLHDL, VLDL, LDLCALC Lab Results  Component Value Date   TSH 2.400 03/19/2014    Therapeutic Level Labs: No results found for: LITHIUM No results found for: VALPROATE No components found for:  CBMZ  Current Medications: Current Outpatient Medications  Medication Sig Dispense Refill  . albuterol (PROAIR HFA) 108 (90 BASE) MCG/ACT inhaler Inhale 2 puffs into the lungs every 4 (four) hours as needed for wheezing or shortness of breath.    Marland Kitchen aspirin EC 81 MG tablet Take 81 mg by mouth daily. Swallow whole.    . Biotin w/ Vitamins C & E (HAIR/SKIN/NAILS PO) Take 2 tablets by mouth daily.    . calcium-vitamin D (OSCAL WITH D) 500-200 MG-UNIT per tablet Take 1 tablet by mouth 2 (two) times daily. For low calcium    . Cholecalciferol (VITAMIN D-3) 25 MCG (1000 UT) CAPS Take 1 capsule by mouth daily.    . Cyanocobalamin (B-12) 2000 MCG TABS Take 2,000 mcg by mouth daily.    Marland Kitchen dexlansoprazole (DEXILANT) 60 MG capsule Take 1 capsule (60 mg total) by mouth daily. 90 capsule 3  . diclofenac sodium (VOLTAREN) 1 % GEL Apply 2 g topically 4 (four) times daily as needed (For pain.).     Marland Kitchen dicyclomine (BENTYL) 20 MG tablet Take 20 mg by mouth 4 (four) times daily as needed.    . diphenoxylate-atropine (LOMOTIL) 2.5-0.025 MG tablet Take  1  tablet by mouth 3 (three) times daily as needed. (Patient taking differently: Take 1 tablet by mouth 3 (three) times daily as needed for diarrhea or loose stools.) 60 tablet 6  . gabapentin (NEURONTIN) 100 MG capsule Take 200-300 mg by mouth See admin instructions. Take 200mg  by mouth every day in the morning and take 300mg  by mouth at bedtime    . lamoTRIgine (LAMICTAL) 100 MG tablet Take 1 tablet (100 mg total) by mouth 2 (two) times daily. 180 tablet 2  . lisinopril (ZESTRIL) 5 MG tablet Take 5 mg by mouth at bedtime.     Marland Kitchen LORazepam (ATIVAN) 1 MG tablet Take 1 tablet (1 mg total) by mouth at bedtime. 30 tablet 2  . metroNIDAZOLE (METROGEL) 1 % gel Apply 1 application topically daily as needed.    . Multiple Vitamins-Minerals (MULTIVITAMIN WITH MINERALS) tablet Take 1 tablet daily by mouth.    . Omega-3 Fatty Acids (FISH OIL) 1000 MG CPDR Take 1 capsule by mouth daily.    Marland Kitchen OVER THE COUNTER MEDICATION Take 1 tablet by mouth daily at 8 pm. Calcium, Magnesium, and Zinc once per day.    Marland Kitchen OVER THE COUNTER MEDICATION Take 1 tablet by mouth daily. Garlic Extract [id number] mg once per day.    Marland Kitchen OVER THE COUNTER MEDICATION Take 1 tablet by mouth daily. Vitamin D3 25 mcg (1000 IU) once per day.    . pravastatin (PRAVACHOL) 80 MG tablet Take 80 mg at bedtime by mouth.     . Turmeric 450 MG CAPS Take 450 mg by mouth daily.    Marland Kitchen venlafaxine XR (EFFEXOR XR) 75 MG 24 hr capsule Take 3 capsules (225 mg total) by mouth daily with breakfast. 270 capsule 2  . vitamin E 400 UNIT capsule Take 400 Units daily by mouth.     No current facility-administered medications for this visit.     Musculoskeletal: Strength & Muscle Tone: within normal limits Gait & Station: normal Patient leans: N/A  Psychiatric Specialty Exam: Review of Systems  Psychiatric/Behavioral: The patient is nervous/anxious.   All other systems reviewed and are negative.   There were no vitals taken for this visit.There is no height or weight  on file to calculate BMI.  General Appearance: NA  Eye Contact:  NA  Speech:  Clear and Coherent  Volume:  Normal  Mood:  Anxious  Affect:  NA  Thought Process:  Goal Directed  Orientation:  Full (Time, Place, and Person)  Thought Content: Rumination   Suicidal Thoughts:  No  Homicidal Thoughts:  No  Memory:  Immediate;   Good Recent;   Fair Remote;   Fair  Judgement:  Good  Insight:  Good  Psychomotor Activity:  Normal  Concentration:  Concentration: Fair and Attention Span: Fair  Recall:  [employer] of Knowledge: Good  Language: Good  Akathisia:  No  Handed:  Right  AIMS (if indicated): not done  Assets:  Communication Skills Desire for Improvement Resilience Social Support Talents/Skills  ADL's:  Intact  Cognition: WNL  Sleep:  Good   Screenings: AUDIT   Flowsheet Row Admission (Discharged) from 03/18/2014 in [facility]  Alcohol Use Disorder Identification Test Final Score (AUDIT) 0    GAD-7   Flowsheet Row Counselor from 10/21/2020 in [place] from 06/20/2018 in [facility]  Total GAD-7 Score 10 16    PHQ2-9   Flowsheet Row Video Visit from 12/28/2020  in [place] Counselor from 10/21/2020 in [place] Counselor from 06/20/2018 in [place] [facility]  PHQ-2 Total Score 1 3 3   PHQ-9 Total Score -- 15 14    Flowsheet Row Video Visit from 12/28/2020 in [place] Counselor from 10/21/2020 in [place] [facility]  C-SSRS RISK CATEGORY Low Risk No Risk       Assessment and Plan: This patient is a 70 year old female with a history of depression anxiety possible bipolar disorder.  She has new complaints of short-term memory issues as well as  difficulty with word finding.  I will try to make a referral for neuropsychological testing.  For now I urged her to take the Ativan just 1 mg at bedtime to help with anxiety if she needs to.  She will also continue Lamictal 100 mg twice daily for mood stabilization and Effexor XR 250 mg daily for depression.  She will return to see me in 3 months   Levonne Spiller, MD 12/28/2020, 2:26 PM

## 2020-12-29 ENCOUNTER — Encounter (HOSPITAL_COMMUNITY): Payer: Self-pay

## 2020-12-30 ENCOUNTER — Telehealth: Payer: Self-pay | Admitting: Psychology

## 2020-12-30 NOTE — Telephone Encounter (Signed)
I have tried calling patient to set her up an appointment with Dr. Sima Matas for and eval for memory loss.  Dr. Harrington Challenger has sent a message through Epic to Dr. Sima Matas to get her scheduled.  I have also sent Dr. Harrington Challenger a message to put a referral into Epic so we can track referral.

## 2020-12-31 ENCOUNTER — Encounter: Payer: Self-pay | Admitting: Pulmonary Disease

## 2020-12-31 ENCOUNTER — Other Ambulatory Visit: Payer: Self-pay

## 2020-12-31 ENCOUNTER — Ambulatory Visit (INDEPENDENT_AMBULATORY_CARE_PROVIDER_SITE_OTHER): Payer: Medicare Other | Admitting: Pulmonary Disease

## 2020-12-31 ENCOUNTER — Institutional Professional Consult (permissible substitution): Payer: Medicare Other | Admitting: Pulmonary Disease

## 2020-12-31 ENCOUNTER — Other Ambulatory Visit (HOSPITAL_COMMUNITY): Payer: Self-pay | Admitting: Psychiatry

## 2020-12-31 VITALS — BP 144/76 | HR 92 | Temp 98.8°F | Ht 65.0 in | Wt 163.0 lb

## 2020-12-31 DIAGNOSIS — F332 Major depressive disorder, recurrent severe without psychotic features: Secondary | ICD-10-CM

## 2020-12-31 DIAGNOSIS — J4540 Moderate persistent asthma, uncomplicated: Secondary | ICD-10-CM

## 2020-12-31 MED ORDER — MONTELUKAST SODIUM 10 MG PO TABS: 10.0000 mg | ORAL_TABLET | Freq: Every day | ORAL | 5 refills | Status: DC

## 2020-12-31 NOTE — Patient Instructions (Signed)
Singulair 10 mg pill nightly  Will arrange for pulmonary function test  Follow up in 6 weeks

## 2020-12-31 NOTE — Progress Notes (Signed)
Lauren [name], Critical Care, and Sleep Medicine  Chief Complaint  Patient presents with  . Consult    Shortness of breath with activity since January 2022, non productive cough for over a year    Constitutional:  BP (!) 144/76 (BP Location: Left Arm, Cuff Size: Normal)   Pulse 92   Temp 98.8 F (37.1 C) (Temporal)   Ht 5\' 5"  (1.651 m)   Wt 163 lb (73.9 kg)   SpO2 95% Comment: Room air  BMI 27.12 kg/m   Past Medical History:  Anxiety, Chronic pain, Fibromyalgia, GERD, Headaches, Hep C, IBS, OA, Pneumonia, Vertigo  Past Surgical History:  She  has a past surgical history that includes Tonsillectomy; Cholecystectomy; Colonoscopy; Upper gastrointestinal endoscopy; Mandible surgery (09/06/1979); ORIF wrist fracture (Right, 07/23/2013); Colonoscopy (N/A, 10/23/2014); Esophagogastroduodenoscopy (N/A, 10/23/2014); Total knee arthroplasty; Knee arthroscopy (Left, 10/21/2015); Cataract extraction w/PHACO (Right, 08/01/2017); Cataract extraction w/PHACO (Left, 08/15/2017); Total knee arthroplasty (Left, 04/16/2018); Esophagogastroduodenoscopy (egd) with propofol (N/A, 09/16/2020); biopsy (09/16/2020); and Savory dilation (09/16/2020).  Brief Summary:  Lauren [name] is a 70 y.o. female former smoker with dyspnea.      Subjective:   She has long history of allergies.  She was previously seen by Dr. Caprice [name] for allergies and asthma, and was on allergy shots.  She smoked 2.5 packs per day until she was age 33 and her husband was a heavy smoker before he passed away from bladder cancer.  She was very busy taking care of her husband and wasn't able to keep up with her health needs.  She has a terrible cough for the past 1.5 years.  She was started on advair in February and this has helped.  She gets tight in her chest and wheezing.  She has more trouble breathing when walking outside.  She also gets coughing spells at night.  She gets eczema.  No problems using aspirin.  Had pneumonia years  ago.  She is from [place].  No animal/bird exposures.  She hasn't had breathing test done recently.  Chest xray from 11/03/20 showed diffuse osteopenia, but otherwise was unremarkable.  She has been using coricidin for her allergies.  She was previously on singulair, and this helped.    Physical Exam:   Appearance - well kempt   ENMT - no sinus tenderness, no oral exudate, no LAN, Mallampati 3 airway, no stridor, deviated septum  Respiratory - equal breath sounds bilaterally, no wheezing or rales  CV - s1s2 regular rate and rhythm, no murmurs  Ext - no clubbing, no edema  Skin - no rashes  Psych - normal mood and affect   [name] testing:    Chest Imaging:   CT angio chest 09/24/19 >> ATX  Sleep Tests:    Cardiac Tests:   Echo 09/25/19 >> EF 60 to 65%, mild LVH, grade 1 DD, mild LA dilation  Social History:  She  reports that she quit smoking about 38 years ago. Her smoking use included cigarettes. She has a 30.00 pack-year smoking history. She has never used smokeless tobacco. She reports that she does not drink alcohol and does not use drugs.  Family History:  Her family history includes Alcohol abuse in her cousin, father, maternal grandfather, maternal grandmother, paternal grandfather, paternal grandmother, and paternal uncle; Anxiety disorder in her maternal uncle and paternal uncle; Breast cancer in her mother; Depression in her father; Diabetes in her brother, brother, father, and paternal grandmother; Healthy in her brother; Heart attack in her father; Heart  disease in her brother; Heart failure in her brother; High Cholesterol in her father; Hypertension in her father, maternal grandmother, and mother; Irritable bowel syndrome in her mother; Sarcoidosis in her brother; Sexual abuse in her mother; Stroke in her father and mother.    Discussion:  She has extensive history of allergies and asthma.  She has history of smoking and second hand tobacco exposure, and  could have COPD also.    Assessment/Plan:   Allergic asthma and rhinitis. - continue advair; might need to increase dose if she still has trouble - add singulair 10 mg qhs - continue flonase and nasal irrigation - continue coricidin - will arrange for [name] function test to determine if she has COPD - might need additional allergy testing if symptoms persist  ACE inhibitor use. - okay to continue lisinopril for now - if cough persists, then might need to consider trial off of lisinopril  Hypertension, hyperlipidemia, varicose veins. - followed by Dr. Jenkins [name] with [facility]  Dysphagia. - followed by Dr. Harvel [name] with [facility]  Time Spent Involved in Patient Care on Day of Examination:  49 minutes  Follow up:  Patient Instructions  Singulair 10 mg pill nightly  Will arrange for [name] function test  Follow up in 6 weeks   Medication List:   Allergies as of 12/31/2020      Reactions   Elavil [amitriptyline] Other (See Comments)   Vivid dreams and almost suicidal   Flagyl [metronidazole] Itching   Vistaril [hydroxyzine Hcl] Other (See Comments)   Got higher than a kite on too much of this.   Geodon [ziprasidone Hydrochloride] Other (See Comments)   Blacked out   Lactose Intolerance (gi) Other (See Comments)   GI upset   Latex Other (See Comments)   [name] at site   Other Other (See Comments)      Medication List       Accurate as of December 31, 2020  1:28 PM. If you have any questions, ask your nurse or doctor.        STOP taking these medications   pravastatin 80 MG tablet Commonly known as: PRAVACHOL Stopped by: Lauren Mires, MD     TAKE these medications   albuterol 108 (90 Base) MCG/ACT inhaler Commonly known as: ProAir HFA Inhale 2 puffs into the lungs every 4 (four) hours as needed for wheezing or shortness of breath.   aspirin EC 81 MG tablet Take 81 mg by mouth daily. Swallow whole.   B-12  2000 MCG Tabs Take 2,000 mcg by mouth daily.   calcium-vitamin D 500-200 MG-UNIT tablet Commonly known as: OSCAL WITH D Take 1 tablet by mouth 2 (two) times daily. For low calcium   Coricidin HBP 10-325-2 MG Tabs Generic drug: DM-APAP-CPM Take by mouth.   Dexilant 60 MG capsule Generic drug: dexlansoprazole Take 1 capsule (60 mg total) by mouth daily.   diclofenac sodium 1 % Gel Commonly known as: VOLTAREN Apply 2 g topically 4 (four) times daily as needed (For pain.).   dicyclomine 20 MG tablet Commonly known as: BENTYL Take 20 mg by mouth 4 (four) times daily as needed.   diphenoxylate-atropine 2.5-0.025 MG tablet Commonly known as: LOMOTIL Take 1 tablet by mouth 3 (three) times daily as needed. What changed: reasons to take this   Fish Oil 1000 MG Cpdr Take 1 capsule by mouth daily.   fluticasone 50 MCG/ACT nasal spray Commonly known as: FLONASE Place 2 sprays  into both nostrils in the morning and at bedtime.   fluticasone-salmeterol 100-50 MCG/ACT Aepb Commonly known as: ADVAIR Inhale 1 puff into the lungs 2 (two) times daily.   gabapentin 100 MG capsule Commonly known as: NEURONTIN Take 200-300 mg by mouth See admin instructions. Take 200mg  by mouth every day in the morning and take 300mg  by mouth at bedtime   HAIR/SKIN/NAILS PO Take 2 tablets by mouth daily.   lamoTRIgine 100 MG tablet Commonly known as: LaMICtal Take 1 tablet (100 mg total) by mouth 2 (two) times daily.   lisinopril 10 MG tablet Commonly known as: ZESTRIL Take 10 mg by mouth daily. What changed: Another medication with the same name was removed. Continue taking this medication, and follow the directions you see here. Changed by: Lauren Mires, MD   LORazepam 1 MG tablet Commonly known as: ATIVAN Take 1 tablet (1 mg total) by mouth at bedtime.   metroNIDAZOLE 1 % gel Commonly known as: METROGEL Apply 1 application topically daily as needed.   montelukast 10 MG tablet Commonly known  as: SINGULAIR Take 1 tablet (10 mg total) by mouth at bedtime. Started by: Lauren Mires, MD   multivitamin with minerals tablet Take 1 tablet daily by mouth.   OVER THE COUNTER MEDICATION Take 1 tablet by mouth daily at 8 pm. Calcium, Magnesium, and Zinc once per day.   OVER THE COUNTER MEDICATION Take 1 tablet by mouth daily. Garlic Extract 4098 mg once per day.   OVER THE COUNTER MEDICATION Take 1 tablet by mouth daily. Vitamin D3 25 mcg (1000 IU) once per day.   pantoprazole 40 MG tablet Commonly known as: PROTONIX Take 40 mg by mouth 2 (two) times daily.   rosuvastatin 10 MG tablet Commonly known as: CRESTOR Take 10 mg by mouth daily.   Turmeric 450 MG Caps Take 450 mg by mouth daily.   venlafaxine XR 75 MG 24 hr capsule Commonly known as: Effexor XR Take 3 capsules (225 mg total) by mouth daily with breakfast.   Vitamin D-3 25 MCG (1000 UT) Caps Take 1 capsule by mouth daily.   vitamin E 180 MG (400 UNITS) capsule Take 400 Units daily by mouth.       Signature:  Lauren Mires, MD [place] Pager - [phone] 12/31/2020, 1:28 PM

## 2021-01-01 ENCOUNTER — Encounter: Payer: Self-pay | Admitting: Psychology

## 2021-01-07 ENCOUNTER — Ambulatory Visit (INDEPENDENT_AMBULATORY_CARE_PROVIDER_SITE_OTHER): Payer: Medicare Other | Admitting: Psychiatry

## 2021-01-07 ENCOUNTER — Other Ambulatory Visit: Payer: Self-pay

## 2021-01-07 DIAGNOSIS — F411 Generalized anxiety disorder: Secondary | ICD-10-CM | POA: Diagnosis not present

## 2021-01-07 DIAGNOSIS — F332 Major depressive disorder, recurrent severe without psychotic features: Secondary | ICD-10-CM | POA: Diagnosis not present

## 2021-01-07 NOTE — Progress Notes (Signed)
Virtual Visit via Telephone Note  I connected with Lauren [name] on 01/07/21 at 2:09 PM EDT  by telephone and verified that I am speaking with the correct person using two identifiers.  Location: Patient: Home Provider: [place] office    I discussed the limitations, risks, security and privacy concerns of performing an evaluation and management service by telephone and the availability of in person appointments. I also discussed with the patient that there may be a patient responsible charge related to this service. The patient expressed understanding and agreed to proceed.    I provided 41 minutes of non-face-to-face time during this encounter.   [name], LCSW                THERAPIST PROGRESS NOTE            Session Time: Thursday 01/07/2021 2:09 PM - 2:50 PM   Participation Level: Active  Behavioral Response: CasualAlert/euthymic   Type of Therapy: Individual Therapy  Treatment Goals addressed: Patient states wanting to be more positive, complete tasks, and improve organizational skills in adjusting to life without her husband/appropriately grieve loss and return to  previous adaptive level of functioning  Interventions: CBT and Supportive  Summary: Lauren [name] is a 70 y.o. female who presents who is referred for services due to stress, anxiety, and depression. She has participated in therapy intermittently for past 25 years. She reports 3 psychiatric hospitalizations with the last one occuring 4 years ago at [facility].   She reports increased stress related to husband's medical condition as he fell, broke his hip, and was placed in a nursing home.  Patient reports bleeding self for husband falling last year.  She reports having a hard time coping.  Symptoms include shame, blame, guilt, ruminating thoughts, feeling overwhelmed, depressed mood, excessive worry, and anxiety.  Patient last was seen via virtual visit about 2-3 weeks  ago.  She reports continued improved mood and continued  involvement in activities since last session.  However, she reports increased sadness and thoughts about her husband earlier this week as it was his birthday.  She continues to cope well with the death of her husband.  She recently got a dog and initially regretted it as she reports being pushed by a friend to get the dog.  She eventually set limits with the friend about her continued involvement and trying to tell patient how to take care of the dog per patient's report.  However patient reports being aggressive with friend in doing so and now is experiencing guilt.  Patient reports taking her dog now has become easier and plans to keep the dog.  Patient reports she no longer is overeating in the evenings.  Patient reports receiving handouts in the mail but does not have for today's session as she has misplaced the information.    Suicidal/Homicidal: Nowithout intent/plan  Therapist Response: reviewed symptoms, praised and reinforced patient's continued involvement in activities/socialization, facilitated expression of thoughts and feelings about deceased husband and his recent birthday, discussed integrated grief and normalized feelings triggered by the birthday, praised and reinforced patient's efforts to set and maintain limits with a friend, discussed assertiveness versus aggressive behavior, assisted patient identify ways to have assertive conversation with friend about recent incident and ways to try to maintain the relationship, discussed lapse versus relapse of depression, began to assist patient identify early warning signs of depression, develop plan with patient to complete handouts previously mailed in preparation for next  session Plan: Return again in 2 weeks.  Diagnosis: Axis I: MDD, Recurrent, GAD

## 2021-01-15 ENCOUNTER — Other Ambulatory Visit (HOSPITAL_COMMUNITY)
Admission: RE | Admit: 2021-01-15 | Discharge: 2021-01-15 | Disposition: A | Discharge status: Home / Self Care | Payer: Medicare Other | Source: Ambulatory Visit | Attending: Pulmonary Disease | Admitting: Pulmonary Disease

## 2021-01-15 DIAGNOSIS — L719 Rosacea, unspecified: Secondary | ICD-10-CM | POA: Diagnosis not present

## 2021-01-15 DIAGNOSIS — Z0000 Encounter for general adult medical examination without abnormal findings: Secondary | ICD-10-CM | POA: Diagnosis not present

## 2021-01-15 DIAGNOSIS — E1169 Type 2 diabetes mellitus with other specified complication: Secondary | ICD-10-CM | POA: Diagnosis not present

## 2021-01-15 DIAGNOSIS — E7800 Pure hypercholesterolemia, unspecified: Secondary | ICD-10-CM | POA: Diagnosis not present

## 2021-01-15 DIAGNOSIS — R413 Other amnesia: Secondary | ICD-10-CM | POA: Diagnosis not present

## 2021-01-15 DIAGNOSIS — G629 Polyneuropathy, unspecified: Secondary | ICD-10-CM | POA: Diagnosis not present

## 2021-01-15 DIAGNOSIS — M25572 Pain in left ankle and joints of left foot: Secondary | ICD-10-CM | POA: Diagnosis not present

## 2021-01-15 DIAGNOSIS — Z20822 Contact with and (suspected) exposure to covid-19: Secondary | ICD-10-CM | POA: Diagnosis not present

## 2021-01-15 DIAGNOSIS — J4520 Mild intermittent asthma, uncomplicated: Secondary | ICD-10-CM | POA: Diagnosis not present

## 2021-01-15 DIAGNOSIS — M159 Polyosteoarthritis, unspecified: Secondary | ICD-10-CM | POA: Diagnosis not present

## 2021-01-15 DIAGNOSIS — Z01812 Encounter for preprocedural laboratory examination: Secondary | ICD-10-CM | POA: Diagnosis not present

## 2021-01-15 DIAGNOSIS — I10 Essential (primary) hypertension: Secondary | ICD-10-CM | POA: Diagnosis not present

## 2021-01-16 LAB — SARS CORONAVIRUS 2 (TAT 6-24 HRS): SARS Coronavirus 2: NEGATIVE

## 2021-01-18 ENCOUNTER — Ambulatory Visit (HOSPITAL_COMMUNITY)
Admission: RE | Admit: 2021-01-18 | Discharge: 2021-01-18 | Disposition: A | Discharge status: Home / Self Care | Payer: Medicare Other | Source: Ambulatory Visit | Attending: Pulmonary Disease | Admitting: Pulmonary Disease

## 2021-01-18 ENCOUNTER — Other Ambulatory Visit: Payer: Self-pay

## 2021-01-18 DIAGNOSIS — J4540 Moderate persistent asthma, uncomplicated: Secondary | ICD-10-CM

## 2021-01-18 LAB — PULMONARY FUNCTION TEST
DL/VA % pred: 100 %
DL/VA: 4.11 ml/min/mmHg/L
DLCO unc % pred: 75 %
DLCO unc: 15.19 ml/min/mmHg
FEF 25-75 Post: 2.76 L/sec
FEF 25-75 Pre: 2.87 L/sec
FEF2575-%Change-Post: -4 %
FEF2575-%Pred-Post: 144 %
FEF2575-%Pred-Pre: 150 %
FEV1-%Change-Post: -4 %
FEV1-%Pred-Post: 86 %
FEV1-%Pred-Pre: 90 %
FEV1-Post: 2.01 L
FEV1-Pre: 2.1 L
FEV1FVC-%Change-Post: -2 %
FEV1FVC-%Pred-Pre: 117 %
FEV6-%Change-Post: -2 %
FEV6-%Pred-Post: 78 %
FEV6-%Pred-Pre: 79 %
FEV6-Post: 2.3 L
FEV6-Pre: 2.35 L
FEV6FVC-%Pred-Post: 104 %
FEV6FVC-%Pred-Pre: 104 %
FVC-%Change-Post: -2 %
FVC-%Pred-Post: 74 %
FVC-%Pred-Pre: 76 %
FVC-Post: 2.3 L
FVC-Pre: 2.35 L
Post FEV1/FVC ratio: 87 %
Post FEV6/FVC ratio: 100 %
Pre FEV1/FVC ratio: 89 %
Pre FEV6/FVC Ratio: 100 %
RV % pred: 79 %
RV: 1.79 L
TLC % pred: 81 %
TLC: 4.25 L

## 2021-01-18 MED ORDER — ALBUTEROL SULFATE (2.5 MG/3ML) 0.083% IN NEBU
2.5000 mg | INHALATION_SOLUTION | Freq: Once | RESPIRATORY_TRACT | Status: AC
  Administered 2021-01-18: 2.5 mg via RESPIRATORY_TRACT

## 2021-01-19 DIAGNOSIS — M79672 Pain in left foot: Secondary | ICD-10-CM | POA: Diagnosis not present

## 2021-01-19 DIAGNOSIS — M79675 Pain in left toe(s): Secondary | ICD-10-CM | POA: Diagnosis not present

## 2021-01-19 DIAGNOSIS — S92335D Nondisplaced fracture of third metatarsal bone, left foot, subsequent encounter for fracture with routine healing: Secondary | ICD-10-CM | POA: Diagnosis not present

## 2021-01-21 ENCOUNTER — Other Ambulatory Visit: Payer: Self-pay

## 2021-01-21 ENCOUNTER — Ambulatory Visit (INDEPENDENT_AMBULATORY_CARE_PROVIDER_SITE_OTHER): Payer: Medicare Other | Admitting: Psychiatry

## 2021-01-21 DIAGNOSIS — F332 Major depressive disorder, recurrent severe without psychotic features: Secondary | ICD-10-CM

## 2021-01-21 DIAGNOSIS — F411 Generalized anxiety disorder: Secondary | ICD-10-CM | POA: Diagnosis not present

## 2021-01-21 NOTE — Progress Notes (Signed)
Virtual Visit via Telephone Note  I connected with [name] on 01/21/21 at  2:00 PM EDT by telephone and verified that I am speaking with the correct person using two identifiers.  Location: Patient:  Home Provider: [place] office    I discussed the limitations, risks, security and privacy concerns of performing an evaluation and management service by telephone and the availability of in person appointments. I also discussed with the patient that there may be a patient responsible charge related to this service. The patient expressed understanding and agreed to proceed.    I provided 46  minutes of non-face-to-face time during this encounter.   [name], LCSW       THERAPIST PROGRESS NOTE            Session Time: Thursday  01/21/2021 2:00 PM - 2:46 PM  Participation Level: Active  Behavioral Response: CasualAlert/euthymic   Type of Therapy: Individual Therapy  Treatment Goals addressed: Patient states wanting to be more positive, complete tasks, and improve organizational skills in adjusting to life without her husband/appropriately grieve loss and return to  previous adaptive level of functioning  Interventions: CBT and Supportive  Summary: TALLEY CASCO is a 70 y.o. female who presents who is referred for services due to stress, anxiety, and depression. She has participated in therapy intermittently for past 25 years. She reports 3 psychiatric hospitalizations with the last one occuring 4 years ago at [facility].   She reports increased stress related to husband's medical condition as he fell, broke his hip, and was placed in a nursing home.  Patient reports bleeding self for husband falling last year.  She reports having a hard time coping.  Symptoms include shame, blame, guilt, ruminating thoughts, feeling overwhelmed, depressed mood, excessive worry, and anxiety.      Patient last was seen via virtual visit about 2-3 weeks  ago.  She reports experiencing increased sadness due to her husband's birthday and the anniversary of her brother's death as well as her mother's death.  She reports may historically has been a very difficult month for her as a result of this.  She reports being a little down for couple of days but coped well by staying busy taking care of her dog and using support from friends.  She is trying to find balance between taking care of her dog, managing household responsibilities, and maintaining a social life.    Suicidal/Homicidal: Nowithout intent/plan  Therapist Response: reviewed symptoms, facilitated expression of thoughts and feelings, validated feelings, normalized increased sadness triggered by special days regarding grief and loss issues, praised and reinforced patient's use of coping skills and support system, reviewed lapse versus relapse of depression, assisted patient identify early warning signs of depression, also began to assist patient identify ways to intervene to avoid relapse, develop plan with patient to complete handouts on early warning signs of depression, also will send patient mindfulness and the window of tolerance handout in preparation for next session    Plan: Return again in 2 weeks.  Diagnosis: Axis I: MDD, Recurrent, GAD

## 2021-02-04 ENCOUNTER — Ambulatory Visit (INDEPENDENT_AMBULATORY_CARE_PROVIDER_SITE_OTHER): Payer: Medicare Other | Admitting: Psychiatry

## 2021-02-04 ENCOUNTER — Other Ambulatory Visit: Payer: Self-pay

## 2021-02-04 DIAGNOSIS — F332 Major depressive disorder, recurrent severe without psychotic features: Secondary | ICD-10-CM

## 2021-02-04 DIAGNOSIS — F411 Generalized anxiety disorder: Secondary | ICD-10-CM
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
## 2021-02-11 ENCOUNTER — Encounter: Payer: Medicare Other | Admitting: Psychology

## 2021-02-12 ENCOUNTER — Ambulatory Visit (INDEPENDENT_AMBULATORY_CARE_PROVIDER_SITE_OTHER): Payer: Medicare Other | Admitting: Pulmonary Disease

## 2021-02-12 ENCOUNTER — Other Ambulatory Visit: Payer: Self-pay

## 2021-02-12 ENCOUNTER — Encounter: Payer: Self-pay | Admitting: Pulmonary Disease

## 2021-02-12 VITALS — BP 146/86 | HR 86 | Temp 98.6°F | Ht 65.0 in | Wt 166.4 lb

## 2021-02-12 DIAGNOSIS — J4540 Moderate persistent asthma, uncomplicated: Secondary | ICD-10-CM | POA: Diagnosis not present

## 2021-02-12 MED ORDER — TRELEGY ELLIPTA 200-62.5-25 MCG/INH IN AEPB: 1.0000 | INHALATION_SPRAY | Freq: Every day | RESPIRATORY_TRACT | 0 refills | Status: DC

## 2021-02-12 NOTE — Patient Instructions (Signed)
Will have you try trelegy 200 sample for one week - call next week if you feel like this is helping and we will then call in a refill.  If you don't think that trelegy is working better, then switch back to advair after the trelegy sample is complete.  Follow up in 3 months.

## 2021-02-12 NOTE — Progress Notes (Signed)
Lauren [name], Lauren [name], and Lauren [name]  Chief Complaint  Patient presents with   Follow-up    Shortness of breath with exertion, go over PFT results    Constitutional:  BP (!) 146/86 (BP Location: Left Arm, Cuff Size: Normal)   Pulse 86   Temp 98.6 F (37 C) (Temporal)   Ht 5\' 5"  (1.651 m)   Wt 166 lb 6.4 oz (75.5 kg)   SpO2 96% Comment: Room air  BMI 27.69 kg/m   Past Medical History:  Anxiety, Chronic pain, Fibromyalgia, GERD, Headaches, Hep C, IBS, OA, Pneumonia, Vertigo  Past Surgical History:  She  has a past surgical history that includes Tonsillectomy; Cholecystectomy; Colonoscopy; Upper gastrointestinal endoscopy; Mandible surgery (09/06/1979); ORIF wrist fracture (Right, 07/23/2013); Colonoscopy (N/A, 10/23/2014); Esophagogastroduodenoscopy (N/A, 10/23/2014); Total knee arthroplasty; Knee arthroscopy (Left, 10/21/2015); Cataract extraction w/PHACO (Right, 08/01/2017); Cataract extraction w/PHACO (Left, 08/15/2017); Total knee arthroplasty (Left, 04/16/2018); Esophagogastroduodenoscopy (egd) with propofol (N/A, 09/16/2020); biopsy (09/16/2020); and Savory dilation (09/16/2020).  Brief Summary:  Lauren [name] is a 70 y.o. female former smoker with allergic asthma.      Subjective:   PFT showed very mild diffusion defect and restrictive defect.    She feels addition of singulair has helped.  Still having intermittent cough, wheeze, and congestion but not as bad as before.  Spring through Fall are bad times of year for her.  She does better in Winter.   Physical Exam:   Appearance - well kempt   ENMT - no sinus tenderness, no oral exudate, no LAN, Mallampati 3 airway, no stridor  Respiratory - equal breath sounds bilaterally, no wheezing or rales  CV - s1s2 regular rate and rhythm, no murmurs  Ext - no clubbing, no edema  Skin - no rashes  Psych - normal mood and affect    [name] testing:  PFT 01/18/21 >> FEV1 2.10 (90%), FEV1% 89, TLC 4.25  (81%), DLCO 75%  Chest Imaging:  CT angio chest 09/24/19 >> ATX  Cardiac Tests:  Echo 09/25/19 >> EF 60 to 65%, mild LVH, grade 1 DD, mild LA dilation  Social History:  She  reports that she quit smoking about 38 years ago. Her smoking use included cigarettes. She has a 30.00 pack-year smoking history. She has never used smokeless tobacco. She reports that she does not drink alcohol and does not use drugs.  Family History:  Her family history includes Alcohol abuse in her cousin, father, maternal grandfather, maternal grandmother, paternal grandfather, paternal grandmother, and paternal uncle; Anxiety disorder in her maternal uncle and paternal uncle; Breast cancer in her mother; Depression in her father; Diabetes in her brother, brother, father, and paternal grandmother; Healthy in her brother; Heart attack in her father; Heart disease in her brother; Heart failure in her brother; High Cholesterol in her father; Hypertension in her father, maternal grandmother, and mother; Irritable bowel syndrome in her mother; Sarcoidosis in her brother; Sexual abuse in her mother; Stroke in her father and mother.     Assessment/Plan:   Severe persistent allergic asthma and rhinitis. - will have her try sample of trelegy 200 in place of advair; she will call if this works better, otherwise will continue advair - continue singulair, flonase, nasal irrigation - prn coricidin - reassess status in the Fall; if she is still have difficulty, then arrange for testing to determine which biologic agent she would be a candidate for  Hypertension, hyperlipidemia, varicose veins. - followed by Dr. Jenkins Rouge with [facility]. - followed by Dr. Harvel Quale with [facility]  Time Spent Involved in Patient [name] on Day of Examination:  23 minutes  Follow up:   Patient Instructions  Will have you try trelegy 200 sample for one week - call next week if you feel  like this is helping and we will then call in a refill.  If you don't think that trelegy is working better, then switch back to advair after the trelegy sample is complete.  Follow up in 3 months.  Medication List:   Allergies as of 02/12/2021       Reactions   Elavil [amitriptyline] Other (See Comments)   Vivid dreams and almost suicidal   Flagyl [metronidazole] Itching   Vistaril [hydroxyzine Hcl] Other (See Comments)   Got higher than a kite on too much of this.   Geodon [ziprasidone Hydrochloride] Other (See Comments)   Blacked out   Lactose Intolerance (gi) Other (See Comments)   GI upset   Latex Other (See Comments)   Red at site   Other Other (See Comments)        Medication List        Accurate as of February 12, 2021 12:42 PM. If you have any questions, ask your nurse or doctor.          STOP taking these medications    lisinopril 10 MG tablet Commonly known as: ZESTRIL Stopped by: Chesley Mires, MD       TAKE these medications    albuterol 108 (90 Base) MCG/ACT inhaler Commonly known as: ProAir HFA Inhale 2 puffs into the lungs every 4 (four) hours as needed for wheezing or shortness of breath.   aspirin EC 81 MG tablet Take 81 mg by mouth daily. Swallow whole.   B-12 2000 MCG Tabs Take 2,000 mcg by mouth daily.   calcium-vitamin D 500-200 MG-UNIT tablet Commonly known as: OSCAL WITH D Take 1 tablet by mouth 2 (two) times daily. For low calcium   Coricidin HBP 10-325-2 MG Tabs Generic drug: DM-APAP-CPM Take by mouth.   Dexilant 60 MG capsule Generic drug: dexlansoprazole Take 1 capsule (60 mg total) by mouth daily.   diclofenac sodium 1 % Gel Commonly known as: VOLTAREN Apply 2 g topically 4 (four) times daily as needed (For pain.).   dicyclomine 20 MG tablet Commonly known as: BENTYL Take 20 mg by mouth 4 (four) times daily as needed.   diphenoxylate-atropine 2.5-0.025 MG tablet Commonly known as: LOMOTIL Take 1 tablet by mouth 3  (three) times daily as needed. What changed: reasons to take this   Fish Oil 1000 MG Cpdr Take 1 capsule by mouth daily.   fluticasone 50 MCG/ACT nasal spray Commonly known as: FLONASE Place 2 sprays into both nostrils in the morning and at bedtime.   fluticasone-salmeterol 100-50 MCG/ACT Aepb Commonly known as: ADVAIR Inhale 1 puff into the lungs 2 (two) times daily.   gabapentin 100 MG capsule Commonly known as: NEURONTIN Take 200-300 mg by mouth See admin instructions. Take 200mg  by mouth every day in the morning and take 300mg  by mouth at bedtime   HAIR/SKIN/NAILS PO Take 2 tablets by mouth daily.   lamoTRIgine 100 MG tablet Commonly known as: LaMICtal Take 1 tablet (100 mg total) by mouth 2 (two) times daily.   LORazepam 1 MG tablet Commonly known as: ATIVAN Take 1 tablet (1 mg total) by mouth at bedtime.   metroNIDAZOLE 1 % gel Commonly known as: METROGEL  Apply 1 application topically daily as needed.   montelukast 10 MG tablet Commonly known as: SINGULAIR Take 1 tablet (10 mg total) by mouth at bedtime.   multivitamin with minerals tablet Take 1 tablet daily by mouth.   OVER THE COUNTER MEDICATION Take 1 tablet by mouth daily at 8 pm. Calcium, Magnesium, and Zinc once per day.   OVER THE COUNTER MEDICATION Take 1 tablet by mouth daily. Garlic Extract 9150 mg once per day.   OVER THE COUNTER MEDICATION Take 1 tablet by mouth daily. Vitamin D3 25 mcg (1000 IU) once per day.   pantoprazole 40 MG tablet Commonly known as: PROTONIX Take 40 mg by mouth 2 (two) times daily.   rosuvastatin 10 MG tablet Commonly known as: CRESTOR Take 10 mg by mouth daily.   Turmeric 450 MG Caps Take 450 mg by mouth daily.   venlafaxine XR 75 MG 24 hr capsule Commonly known as: Effexor XR Take 3 capsules (225 mg total) by mouth daily with breakfast.   Vitamin D-3 25 MCG (1000 UT) Caps Take 1 capsule by mouth daily.   vitamin E 180 MG (400 UNITS) capsule Take 400 Units  daily by mouth.        Signature:  Chesley Mires, MD [place] Pager - [phone] 02/12/2021, 12:42 PM

## 2021-02-15 ENCOUNTER — Telehealth: Payer: Self-pay | Admitting: Pulmonary Disease

## 2021-02-15 NOTE — Telephone Encounter (Signed)
Called and spoke with patient who states when she opened the sample of trelegy today to use for the first time and the package appeared open and the count was 13 not 14. Patient did not use and is going to return it to the office tomorrow and we will supply her with a new sample of Trelegy.

## 2021-02-16 NOTE — Telephone Encounter (Signed)
Called and left message on voicemail to please return phone call to let us know if and when she will be coming to pick up sample of Trelegy 200.

## 2021-02-17 DIAGNOSIS — E7800 Pure hypercholesterolemia, unspecified: Secondary | ICD-10-CM | POA: Diagnosis not present

## 2021-02-17 DIAGNOSIS — I10 Essential (primary) hypertension: Secondary | ICD-10-CM | POA: Diagnosis not present

## 2021-02-17 MED ORDER — TRELEGY ELLIPTA 200-62.5-25 MCG/INH IN AEPB: 1.0000 | INHALATION_SPRAY | Freq: Every day | RESPIRATORY_TRACT | 0 refills | Status: DC

## 2021-02-17 NOTE — Telephone Encounter (Signed)
Patient stopped by office and picked up new sample of Trelegy and returned opened packaged sample that was previously given. Patient stated she will call to update on how the Trelegy is working for her. Nothing further needed at this time.

## 2021-02-18 ENCOUNTER — Ambulatory Visit (INDEPENDENT_AMBULATORY_CARE_PROVIDER_SITE_OTHER): Payer: Medicare Other | Admitting: Psychiatry

## 2021-02-18 ENCOUNTER — Other Ambulatory Visit: Payer: Self-pay

## 2021-02-18 DIAGNOSIS — F332 Major depressive disorder, recurrent severe without psychotic features: Secondary | ICD-10-CM | POA: Diagnosis not present
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
## 2021-02-22 DIAGNOSIS — S92335D Nondisplaced fracture of third metatarsal bone, left foot, subsequent encounter for fracture with routine healing: Secondary | ICD-10-CM | POA: Diagnosis not present

## 2021-02-22 DIAGNOSIS — M79672 Pain in left foot: Secondary | ICD-10-CM | POA: Diagnosis not present

## 2021-02-22 DIAGNOSIS — M79675 Pain in left toe(s): Secondary | ICD-10-CM | POA: Diagnosis not present

## 2021-02-24 DIAGNOSIS — S92525A Nondisplaced fracture of medial phalanx of left lesser toe(s), initial encounter for closed fracture: Secondary | ICD-10-CM | POA: Diagnosis not present

## 2021-03-04 ENCOUNTER — Ambulatory Visit (INDEPENDENT_AMBULATORY_CARE_PROVIDER_SITE_OTHER): Payer: Medicare Other | Admitting: Psychiatry

## 2021-03-04 ENCOUNTER — Other Ambulatory Visit: Payer: Self-pay

## 2021-03-04 DIAGNOSIS — F332 Major depressive disorder, recurrent severe without psychotic features: Secondary | ICD-10-CM

## 2021-03-04 DIAGNOSIS — F411 Generalized anxiety disorder: Secondary | ICD-10-CM

## 2021-03-04 NOTE — Progress Notes (Signed)
Virtual Visit via Video Note  I connected with Lauren [name] on 03/04/21 at  3:00 PM EDT by a video enabled telemedicine application and verified that I am speaking with the correct person using two identifiers.  Location: Patient: Home Provider: [place] office    I discussed the limitations of evaluation and management by telemedicine and the availability of in person appointments. The patient expressed understanding and agreed to proceed.  I provided 48 minutes of non-face-to-face time during this encounter.   [name], LCSW      THERAPIST PROGRESS NOTE             Session Time: Thursday  03/04/2021 3:00 PM - 3:48 PM  Participation Level: Active  Behavioral Response: CasualAlert/less depressed, less anxious  Type of Therapy: Individual Therapy  Treatment Goals addressed: Patient states wanting to be more positive, complete tasks, and improve organizational skills in adjusting to life without her husband/appropriately grieve loss and return to  previous adaptive level of functioning  Interventions: CBT and Supportive  Summary: Lauren [name] is a 70 y.o. female who presents who is referred for services due to stress, anxiety, and depression. She has participated in therapy intermittently for past 25 years. She reports 3 psychiatric hospitalizations with the last one occuring 4 years ago at [facility].   She reports increased stress related to husband's medical condition as he fell, broke his hip, and was placed in a nursing home.  Patient reports bleeding self for husband falling last year.  She reports having a hard time coping.  Symptoms include shame, blame, guilt, ruminating thoughts, feeling overwhelmed, depressed mood, excessive worry, and anxiety.      Patient last was seen via virtual visit about 2-3 weeks ago.  She reports decreased symptoms of depression  and anxiety.  She has increased involvement in activities and recently spent  the night with a friend in [place].  She also reports volunteering at [employer].  She has been going out to eat with friends and has continued to help her neighbors.  Patient also reports recently attending a funeral.  This triggered memories and feelings about her husband but patient reports not being overwhelmed.  She reports less worry about her past.  Patient also reports successfully using self talk as well as reassuring self in a recent situation she normally would have asked husband for reassurance.    Suicidal/Homicidal: Nowithout intent/plan  Therapist Response: reviewed symptoms, praised and reinforced patient's efforts to maintain consistent behavioral activation and socialization, discussed rationale for and provided psychoeducation on on mindfulness and understanding of the window of tolerance to help regulate emotions, assisted patient identify and practice grounding techniques to intervene when outside the window of tolerance, developed a plan with patient to practice a grounding technique daily   Plan: Return again in 2 weeks.  Diagnosis: Axis I: MDD, Recurrent, GAD

## 2021-03-18 ENCOUNTER — Ambulatory Visit (INDEPENDENT_AMBULATORY_CARE_PROVIDER_SITE_OTHER): Payer: Medicare Other | Admitting: Psychiatry

## 2021-03-18 ENCOUNTER — Other Ambulatory Visit: Payer: Self-pay

## 2021-03-18 DIAGNOSIS — F332 Major depressive disorder, recurrent severe without psychotic features: Secondary | ICD-10-CM

## 2021-03-18 NOTE — Progress Notes (Signed)
Virtual Visit via Telephone Note  I connected with Lauren [name] on 03/18/21 at 1:13 PM EDT  by telephone and verified that I am speaking with the correct person using two identifiers.  Location: Patient: Lauren [name]    I discussed the limitations, risks, security and privacy concerns of performing an evaluation and management service by telephone and the availability of in person appointments. I also discussed with the patient that there may be a patient responsible charge related to this service. The patient expressed understanding and agreed to proceed.   I provided  47 minutes of non-face-to-face time during this encounter.   [name], LCSW      THERAPIST PROGRESS NOTE             Session Time: Thursday  03/18/2021 1:13 PM - 2:00 PM Participation Level: Active  Behavioral Response: CasualAlert/less depressed, less anxious  Type of Therapy: Individual Therapy  Treatment Goals addressed: Patient states wanting to be more positive, complete tasks, and improve organizational skills in adjusting to life without her husband/appropriately grieve loss and return to  previous adaptive level of functioning  Interventions: CBT and Supportive  Summary: Lauren [name] is a 70 y.o. female who presents who is referred for services due to stress, anxiety, and depression. She has participated in therapy intermittently for past 25 years. She reports 3 psychiatric hospitalizations with the last one occuring 4 years ago at [facility].   She reports increased stress related to husband's medical condition as he fell, broke his hip, and was placed in a nursing home.  Patient reports bleeding self for husband falling last year.  She reports having a hard time coping.  Symptoms include shame, blame, guilt, ruminating thoughts, feeling overwhelmed, depressed mood, excessive worry, and anxiety.      Patient last was seen via virtual visit about  2-3 weeks ago.  She reports last week was good but experiencing increased memories as well as grief and loss issues this week.  She has been using mindfulness skills and reports increased awareness of her thoughts and feelings.  She reports a recent example involving a situation involving her sister and her neighbor.  Patient reports recognizing being out of the window of tolerance and successfully using grounding techniques to manage.  She reports being able to respond wisely and having less worry about the situation.  The first anniversary of her husband's death is April 25, 2023.  She reports increased memories as well as feelings of sadness along with guilt.  She becomes tearful during session.  Patient has maintained consistent behavioral activation and socialization. Suicidal/Homicidal: Nowithout intent/plan  Therapist Response: reviewed symptoms, praised and reinforced patient's efforts to use mindfulness skills along with grounding techniques, discussed effects, facilitated patient expressing thoughts and feelings related to grief and loss issues regarding her husband, normalized increased memories along with feelings associated with acute grief, assisted patient identify ways to cope with the upcoming anniversary of her husband's death, gust possible ways to honor her husband as well as ways to use her support system  Plan: Return again in 2 weeks.  Diagnosis: Axis I: MDD, Recurrent, GAD

## 2021-03-19 ENCOUNTER — Telehealth: Payer: Self-pay | Admitting: Pulmonary Disease

## 2021-03-19 MED ORDER — TRELEGY ELLIPTA 200-62.5-25 MCG/INH IN AEPB: 1.0000 | INHALATION_SPRAY | Freq: Every day | RESPIRATORY_TRACT | 11 refills | Status: DC

## 2021-03-19 NOTE — Telephone Encounter (Signed)
Okay to send refill for trelegy 200 one puff daily and proair.

## 2021-03-19 NOTE — Telephone Encounter (Signed)
Called patient twice and received a busy tone. Will attempt to call back later.

## 2021-03-19 NOTE — Telephone Encounter (Signed)
Spoke to patient, who is requesting Rx for Trelegy 200, as she felt this medication was effective.  Rx sent to preferred pharmacy. Patient is also requesting refill on proair. This was not prescribed by our office originally.  Dr. Halford Chessman, please advise on proair? Thanks

## 2021-03-22 DIAGNOSIS — M79675 Pain in left toe(s): Secondary | ICD-10-CM | POA: Diagnosis not present

## 2021-03-22 DIAGNOSIS — S92335D Nondisplaced fracture of third metatarsal bone, left foot, subsequent encounter for fracture with routine healing: Secondary | ICD-10-CM | POA: Diagnosis not present

## 2021-03-22 DIAGNOSIS — M79672 Pain in left foot: Secondary | ICD-10-CM | POA: Diagnosis not present

## 2021-03-25 NOTE — Telephone Encounter (Signed)
ATC pt. Line rings without VM. WCB.

## 2021-04-01 ENCOUNTER — Ambulatory Visit (INDEPENDENT_AMBULATORY_CARE_PROVIDER_SITE_OTHER): Payer: Medicare Other | Admitting: Psychiatry

## 2021-04-01 ENCOUNTER — Other Ambulatory Visit: Payer: Self-pay

## 2021-04-01 DIAGNOSIS — F411 Generalized anxiety disorder: Secondary | ICD-10-CM

## 2021-04-01 DIAGNOSIS — F332 Major depressive disorder, recurrent severe without psychotic features: Secondary | ICD-10-CM | POA: Diagnosis not present

## 2021-04-01 NOTE — Progress Notes (Signed)
Virtual Visit via Telephone Note  I connected with Lauren [name] on 04/01/21 at 1:12 PM EDT  by telephone and verified that I am speaking with the correct person using two identifiers.  Location: Patient: Home Provider: [place] office    I discussed the limitations, risks, security and privacy concerns of performing an evaluation and management service by telephone and the availability of in person appointments. I also discussed with the patient that there may be a patient responsible charge related to this service. The patient expressed understanding and agreed to proceed.    I provided 42 minutes of non-face-to-face time during this encounter.   [name], LCSW      THERAPIST PROGRESS NOTE             Session Time: Thursday  04/01/2021 1:12 PM - 1:54 PM  Participation Level: Active  Behavioral Response: CasualAlert/euthymic  Type of Therapy: Individual Therapy  Treatment Goals addressed: Patient states wanting to be more positive, complete tasks, and improve organizational skills in adjusting to life without her husband/appropriately grieve loss and return to  previous adaptive level of functioning  Interventions: CBT and Supportive  Summary: Lauren [name] is a 70 y.o. female who presents who is referred for services due to stress, anxiety, and depression. She has participated in therapy intermittently for past 25 years. She reports 3 psychiatric hospitalizations with the last one occuring 4 years ago at [facility].   She reports increased stress related to husband's medical condition as he fell, broke his hip, and was placed in a nursing home.  Patient reports bleeding self for husband falling last year.  She reports having a hard time coping.  Symptoms include shame, blame, guilt, ruminating thoughts, feeling overwhelmed, depressed mood, excessive worry, and anxiety.      Patient last was seen via virtual visit about 2-3 weeks ago.   She reports doing well since last session.  She is pleased with the way she managed the anniversary of her husband's death.  Per her report, she honored husband at an [employer].  There were also other people who spoke about her husband at the meeting and this was very helpful and appreciated by patient.  She continues to experience sadness about her husband at times but does not report being overwhelmed by this.  She continues to cope well with support from friends.  She also continues to attend [employer], attending church, and rely on her spirituality.  She reports continued increased awareness of when she is outside the window of tolerance and successfully using grounding techniques.  She also has been practicing mindfulness skills.  Suicidal/Homicidal: Nowithout intent/plan  Therapist Response: reviewed symptoms, praised and reinforced patient's efforts to use mindfulness skills along with grounding techniques, discussed effects, facilitated patient expressing thoughts and feelings related to grief and loss issues regarding her husband, normalized and validated feelings, assisted patient practice mindfulness activity using breath awareness to improve mindfulness skills, developed plan with patient to practice a mindfulness activity 5 to 10 minutes daily, also will send patient mental health maintenance plan in preparation for next session.I  Plan: Return again in 2 weeks.  Diagnosis: Axis I: MDD, Recurrent, GAD

## 2021-04-01 NOTE — Telephone Encounter (Signed)
Lmtcb for pt.  

## 2021-04-07 ENCOUNTER — Encounter: Payer: Self-pay | Admitting: Psychology

## 2021-04-07 ENCOUNTER — Encounter: Payer: Medicare Other | Attending: Psychology | Admitting: Psychology

## 2021-04-07 ENCOUNTER — Other Ambulatory Visit: Payer: Self-pay

## 2021-04-07 DIAGNOSIS — F331 Major depressive disorder, recurrent, moderate: Secondary | ICD-10-CM | POA: Insufficient documentation

## 2021-04-07 DIAGNOSIS — F5105 Insomnia due to other mental disorder: Secondary | ICD-10-CM | POA: Insufficient documentation

## 2021-04-07 DIAGNOSIS — R413 Other amnesia: Secondary | ICD-10-CM | POA: Diagnosis not present
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
## 2021-04-08 MED ORDER — ALBUTEROL SULFATE HFA 108 (90 BASE) MCG/ACT IN AERS: 2.0000 | INHALATION_SPRAY | RESPIRATORY_TRACT | 3 refills | Status: DC | PRN

## 2021-04-08 NOTE — Telephone Encounter (Signed)
Proair sent to [facility]. ATC pt and had to leave a detailed VM about refill. Advised pt to call back if needed. Nothing further needed at this time.

## 2021-04-09 ENCOUNTER — Telehealth: Payer: Self-pay | Admitting: Pulmonary Disease

## 2021-04-09 NOTE — Telephone Encounter (Signed)
LMTCB

## 2021-04-13 NOTE — Telephone Encounter (Signed)
Called and spoke to pt. Pt states she received both the trelegy and Proair. Nothing further needed at this time. (See phone note from 7/15 for context).

## 2021-04-21 DIAGNOSIS — E7800 Pure hypercholesterolemia, unspecified: Secondary | ICD-10-CM | POA: Diagnosis not present

## 2021-04-21 DIAGNOSIS — Z23 Encounter for immunization: Secondary | ICD-10-CM | POA: Diagnosis not present

## 2021-04-21 DIAGNOSIS — E1169 Type 2 diabetes mellitus with other specified complication: Secondary | ICD-10-CM | POA: Diagnosis not present

## 2021-04-21 DIAGNOSIS — M797 Fibromyalgia: Secondary | ICD-10-CM | POA: Diagnosis not present

## 2021-04-21 DIAGNOSIS — I10 Essential (primary) hypertension: Secondary | ICD-10-CM | POA: Diagnosis not present

## 2021-04-26 ENCOUNTER — Other Ambulatory Visit: Payer: Self-pay

## 2021-04-26 ENCOUNTER — Ambulatory Visit (HOSPITAL_COMMUNITY): Payer: Medicare Other | Admitting: Psychiatry

## 2021-05-17 ENCOUNTER — Encounter: Payer: Self-pay | Admitting: Pulmonary Disease

## 2021-05-17 ENCOUNTER — Other Ambulatory Visit: Payer: Self-pay

## 2021-05-17 ENCOUNTER — Ambulatory Visit (INDEPENDENT_AMBULATORY_CARE_PROVIDER_SITE_OTHER): Payer: Medicare Other | Admitting: Pulmonary Disease

## 2021-05-17 VITALS — BP 130/80 | HR 78 | Temp 98.7°F | Ht 66.0 in | Wt 165.1 lb

## 2021-05-17 DIAGNOSIS — J4550 Severe persistent asthma, uncomplicated: Secondary | ICD-10-CM

## 2021-05-17 DIAGNOSIS — Z23 Encounter for immunization: Secondary | ICD-10-CM | POA: Diagnosis not present

## 2021-05-17 DIAGNOSIS — J3089 Other allergic rhinitis: Secondary | ICD-10-CM

## 2021-05-17 NOTE — Progress Notes (Signed)
[facility]  Chief Complaint  Patient presents with   Follow-up    No new SOB or coughing.     Constitutional:  BP 130/80 (BP Location: Left Arm, Patient Position: Sitting)   Pulse 78   Temp 98.7 F (37.1 C) (Oral)   Ht '5\' 6"'$  (1.676 m)   Wt 165 lb 1.9 oz (74.9 kg)   SpO2 95% Comment: ra  BMI 26.65 kg/m   Past Medical History:  Anxiety, Chronic pain, Fibromyalgia, GERD, Headaches, Hep C, IBS, OA, Pneumonia, Vertigo  Past Surgical History:  She  has a past surgical history that includes Tonsillectomy; Cholecystectomy; Colonoscopy; Upper gastrointestinal endoscopy; Mandible surgery (09/06/1979); ORIF wrist fracture (Right, 07/23/2013); Colonoscopy (N/A, 10/23/2014); Esophagogastroduodenoscopy (N/A, 10/23/2014); Total knee arthroplasty; Knee arthroscopy (Left, 10/21/2015); Cataract extraction w/PHACO (Right, 08/01/2017); Cataract extraction w/PHACO (Left, 08/15/2017); Total knee arthroplasty (Left, 04/16/2018); Esophagogastroduodenoscopy (egd) with propofol (N/A, 09/16/2020); biopsy (09/16/2020); and Savory dilation (09/16/2020).  Brief Summary:  Lauren [name] is a 70 y.o. female former smoker with allergic asthma.      Subjective:   She continues to have sinus congestion, sneezing, and cough.  She feels her voice is more hoarse.  She sometimes brings up sputum.  She was previously on allergy shots through Dr. Caprice Red.  She feels trelegy works better than advair.  She has been using benadryl at night.  Physical Exam:   Appearance - well kempt   ENMT - no sinus tenderness, no oral exudate, no LAN, Mallampati 3 airway, no stridor, clear nasal drainage, deviated nasal septum  Respiratory - equal breath sounds bilaterally, no wheezing or rales  CV - s1s2 regular rate and rhythm, no murmurs  Ext - no clubbing, no edema, left foot in a boot  Skin - no rashes  Psych - normal mood and affect     Pulmonary testing:  PFT 01/18/21 >>  FEV1 2.10 (90%), FEV1% 89, TLC 4.25 (81%), DLCO 75%  Chest Imaging:  CT angio chest 09/24/19 >> ATX  Cardiac Tests:  Echo 09/25/19 >> EF 60 to 65%, mild LVH, grade 1 DD, mild LA dilation  Social History:  She  reports that she quit smoking about 38 years ago. Her smoking use included cigarettes. She has a 30.00 pack-year smoking history. She has never used smokeless tobacco. She reports that she does not drink alcohol and does not use drugs.  Family History:  Her family history includes Alcohol abuse in her cousin, father, maternal grandfather, maternal grandmother, paternal grandfather, paternal grandmother, and paternal uncle; Anxiety disorder in her maternal uncle and paternal uncle; Breast cancer in her mother; Depression in her father; Diabetes in her brother, brother, father, and paternal grandmother; Healthy in her brother; Heart attack in her father; Heart disease in her brother; Heart failure in her brother; High Cholesterol in her father; Hypertension in her father, maternal grandmother, and mother; Irritable bowel syndrome in her mother; Sarcoidosis in her brother; Sexual abuse in her mother; Stroke in her father and mother.    Discussion:  She has persistent symptoms.  She has been on LAMA, LABA, ICS and leukotriene inhibitor.  Will need to assess whether she is a candidate for a biologic agent to control her asthma and allergies.  Assessment/Plan:   Severe persistent allergic asthma and rhinitis. - continue trelegy and singulair - prn albuterol - will arrange for FeNO, CBC with diff, and RAST with IgE and then determine if she is a candidate for a biologic agent  Perennial allergic rhinitis with nasal septal deviation. - continue flonase, singulair, coricidin, benadryl, nasal irrigation  Hypertension, hyperlipidemia, varicose veins. - followed by Dr. Jenkins Rouge with [facility]  Dysphagia. - followed by Dr. Harvel Quale with [facility]  Dise[facility]  Time Spent Involved in Patient Care on Day of Examination:  33 minutes  Follow up:   Patient Instructions  Lab tests today  Will arrange for FeNO breathing test  Follow up in 2 to 3 weeks with Dr. Halford Chessman or Nurse Practitioner in [place] office  Medication List:   Allergies as of 05/17/2021       Reactions   Elavil [amitriptyline] Other (See Comments)   Vivid dreams and almost suicidal   Flagyl [metronidazole] Itching   Vistaril [hydroxyzine Hcl] Other (See Comments)   Got higher than a kite on too much of this.   Geodon [ziprasidone Hydrochloride] Other (See Comments)   Blacked out   Lactose Intolerance (gi) Other (See Comments)   GI upset   Latex Other (See Comments)   Red at site   Other Other (See Comments)        Medication List        Accurate as of May 17, 2021  1:16 PM. If you have any questions, ask your nurse or doctor.          STOP taking these medications    aspirin EC 81 MG tablet Stopped by: Chesley Mires, MD   Dexilant 60 MG capsule Generic drug: dexlansoprazole Stopped by: Chesley Mires, MD   fluticasone-salmeterol 100-50 MCG/ACT Aepb Commonly known as: ADVAIR Stopped by: Chesley Mires, MD       TAKE these medications    albuterol 108 (90 Base) MCG/ACT inhaler Commonly known as: ProAir HFA Inhale 2 puffs into the lungs every 4 (four) hours as needed for wheezing or shortness of breath.   B-12 2000 MCG Tabs Take 2,000 mcg by mouth daily.   calcium-vitamin D 500-200 MG-UNIT tablet Commonly known as: OSCAL WITH D Take 1 tablet by mouth 2 (two) times daily. For low calcium   Coricidin HBP 10-325-2 MG Tabs Generic drug: DM-APAP-CPM Take by mouth.   diclofenac sodium 1 % Gel Commonly known as: VOLTAREN Apply 2 g topically 4 (four) times daily as needed (For pain.).   dicyclomine 20 MG tablet Commonly known as: BENTYL Take 20 mg by mouth 4 (four) times daily as needed.   diphenoxylate-atropine 2.5-0.025 MG  tablet Commonly known as: LOMOTIL Take 1 tablet by mouth 3 (three) times daily as needed. What changed: reasons to take this   Fish Oil 1000 MG Cpdr Take 1 capsule by mouth daily.   fluticasone 50 MCG/ACT nasal spray Commonly known as: FLONASE Place 2 sprays into both nostrils in the morning and at bedtime.   gabapentin 100 MG capsule Commonly known as: NEURONTIN Take 200-300 mg by mouth See admin instructions. Take '200mg'$  by mouth every day in the morning and take '300mg'$  by mouth at bedtime   HAIR/SKIN/NAILS PO Take 2 tablets by mouth daily.   lamoTRIgine 100 MG tablet Commonly known as: LaMICtal Take 1 tablet (100 mg total) by mouth 2 (two) times daily.   LORazepam 1 MG tablet Commonly known as: ATIVAN Take 1 tablet (1 mg total) by mouth at bedtime.   losartan 25 MG tablet Commonly known as: COZAAR Take 25 mg by mouth daily.   metroNIDAZOLE 1 % gel Commonly known as: METROGEL Apply 1 application topically daily as needed.  montelukast 10 MG tablet Commonly known as: SINGULAIR Take 1 tablet (10 mg total) by mouth at bedtime.   multivitamin with minerals tablet Take 1 tablet daily by mouth.   OVER THE COUNTER MEDICATION Take 1 tablet by mouth daily at 8 pm. Calcium, Magnesium, and Zinc once per day.   OVER THE COUNTER MEDICATION Take 1 tablet by mouth daily. Garlic Extract 123XX123 mg once per day.   OVER THE COUNTER MEDICATION Take 1 tablet by mouth daily. Vitamin D3 25 mcg (1000 IU) once per day.   pantoprazole 40 MG tablet Commonly known as: PROTONIX Take 40 mg by mouth 2 (two) times daily.   rosuvastatin 10 MG tablet Commonly known as: CRESTOR Take 10 mg by mouth daily.   Trelegy Ellipta 200-62.5-25 MCG/INH Aepb Generic drug: Fluticasone-Umeclidin-Vilant Inhale 1 puff into the lungs daily.   Turmeric 450 MG Caps Take 450 mg by mouth daily.   venlafaxine XR 75 MG 24 hr capsule Commonly known as: Effexor XR Take 3 capsules (225 mg total) by mouth daily  with breakfast.   Vitamin D-3 25 MCG (1000 UT) Caps Take 1 capsule by mouth daily.   vitamin E 180 MG (400 UNITS) capsule Take 400 Units daily by mouth.        Signature:  Chesley Mires, MD [place] Pager - [phone] 05/17/2021, 1:16 PM

## 2021-05-17 NOTE — Patient Instructions (Signed)
Lab tests today  Will arrange for FeNO breathing test  Follow up in 2 to 3 weeks with Dr. Halford Chessman or Nurse Practitioner in [place] office

## 2021-05-20 ENCOUNTER — Encounter: Payer: Medicare Other | Admitting: Psychology

## 2021-05-21 LAB — ALLERGEN PROFILE, PERENNIAL ALLERGEN IGE

## 2021-06-02 ENCOUNTER — Ambulatory Visit: Payer: Medicare Other | Admitting: Psychology

## 2021-06-02 ENCOUNTER — Encounter: Payer: Medicare Other | Admitting: Psychology

## 2021-06-03 DIAGNOSIS — S92335D Nondisplaced fracture of third metatarsal bone, left foot, subsequent encounter for fracture with routine healing: Secondary | ICD-10-CM | POA: Diagnosis not present

## 2021-06-03 DIAGNOSIS — M79675 Pain in left toe(s): Secondary | ICD-10-CM | POA: Diagnosis not present

## 2021-06-03 DIAGNOSIS — M79672 Pain in left foot: Secondary | ICD-10-CM | POA: Diagnosis not present

## 2021-06-08 ENCOUNTER — Ambulatory Visit: Payer: Medicare Other | Admitting: Psychology

## 2021-06-24 ENCOUNTER — Encounter: Payer: Medicare Other | Attending: Psychology

## 2021-06-24 ENCOUNTER — Other Ambulatory Visit: Payer: Self-pay

## 2021-06-24 DIAGNOSIS — R413 Other amnesia: Secondary | ICD-10-CM | POA: Diagnosis not present

## 2021-06-24 DIAGNOSIS — F331 Major depressive disorder, recurrent, moderate: Secondary | ICD-10-CM | POA: Insufficient documentation

## 2021-06-24 NOTE — Progress Notes (Signed)
Behavioral Observations  The patient arrived early for her appointment and appeared well-groomed and appropriately dressed. Her manners were polite and appropriate to the situation. The patient reported that she was extremely anxious about the test and became very upset before beginning. The patient had some confusion about her medication and worried that taking the medication may impact her performance. She became emotional several times throughout the test and was often tearful.   Neuropsychology Note  Lauren [name] completed 150 minutes of neuropsychological testing with technician, Dina Rich, BA, under the supervision of Ilean Skill, PsyD., Clinical Neuropsychologist. The patient did not appear overtly distressed by the testing session, per behavioral observation or via self-report to the technician. Rest breaks were offered.   Clinical Decision Making: In considering the patient's current level of functioning, level of presumed impairment, nature of symptoms, emotional and behavioral responses during clinical interview, level of literacy, and observed level of motivation/effort, a battery of tests was selected by Dr. Sima Matas during initial consultation on 04/07/2021. This was communicated to the technician. Communication between the neuropsychologist and technician was ongoing throughout the testing session and changes were made as deemed necessary based on patient performance on testing, technician observations and additional pertinent factors such as those listed above.  Tests Administered: Controlled Oral Word Association Test (COWAT; FAS & Animals)  Wechsler Adult Intelligence Scale, 4th Edition (WAIS-IV) Wechsler Memory Scale, 4th Edition (WMS-IV) Older Adult Battery   Results:  COWAT FAS Total = 34 Z = -0.66 Animals Total = 16 Z = -0.52     WAIS-IV    Composite Score Summary  Scale Sum of Scaled Scores Composite Score Percentile Rank 95% Conf. Interval  Qualitative Description  Verbal Comprehension 31 VCI 102 55 96-108 Average  Perceptual Reasoning 28 PRI 96 39 90-102 Average  Working Memory 13 WMI 80 9 74-88 Low Average  Processing Speed 20 PSI 100 50 92-108 Average  Full Scale 92 FSIQ 94 34 90-98 Average  General Ability 59 GAI 99 47 94-104 Average      Verbal Comprehension Subtests Summary  Subtest Raw Score Scaled Score Percentile Rank Reference Group Scaled Score SEM  Similarities 23 10 50 9 0.95  Vocabulary 41 11 63 12 0.67  Information 14 10 50 11 0.73  (Comprehension) 24 11 63 10 1.27       Perceptual Reasoning Subtests Summary  Subtest Raw Score Scaled Score Percentile Rank Reference Group Scaled Score SEM  Block Design 20 7 16 5  0.99  Matrix Reasoning 15 12 75 8 0.90  Visual Puzzles 10 9 37 7 0.99  (Picture Completion) 9 9 37 7 0.99       Working Doctor, general practice Raw Score Scaled Score Percentile Rank Reference Group Scaled Score SEM  Digit Span 17 6 9 4  0.73  Arithmetic 9 7 16 6  1.20       Processing Speed Subtests Summary  Subtest Raw Score Scaled Score Percentile Rank Reference Group Scaled Score SEM  Symbol Search 29 12 75 8 1.12  Coding 37 8 25 4  1.12        WMS-IV  Index Score Summary  Index Sum of Scaled Scores Index Score Percentile Rank 95% Confidence Interval Qualitative Descriptor  Auditory Memory (AMI) 41 101 53 95-107 Average  Visual Memory (VMI) 18 95 37 90-100 Average  Immediate Memory (IMI) 26 91 27 85-98 Average  Delayed Memory (DMI) 33 106 66 98-113 Average      Primary Subtest Scaled Score Summary  Subtest Domain Raw Score Scaled Score Percentile Rank  Logical Memory I AM 20 6 9   Logical Memory II AM 14 9 37  Verbal [employer] I AM 25 12 75  Verbal [employer] II AM 9 14 91  Visual Reproduction I VM 26 8 25   Visual Reproduction II VM 17 10 50  Symbol Span VWM 12 8 25       Auditory Memory Process Score Summary  Process Score Raw  Score Scaled Score Percentile Rank Cumulative Percentage (Base Rate)  LM II Recognition 16 - - 17-25%  VPA II Recognition 29 - - >75%       Visual Memory Process Score Summary  Process Score Raw Score Scaled Score Percentile Rank Cumulative Percentage (Base Rate)  VR II Recognition 5 - - 51-75%      ABILITY-MEMORY ANALYSIS  Ability Score:  VCI: 102 Date of Testing:  WAIS-IV; WMS-IV 2021/06/24  Predicted Difference Method   Index Predicted WMS-IV Index Score Actual WMS-IV Index Score Difference Critical Value  Significant Difference Y/N Base Rate  Auditory Memory 101 101 0 10.41 N   Visual Memory 101 95 6 7.35 N   Immediate Memory 101 91 10 9.69 Y 20-25%  Delayed Memory 101 106 -5 12.22 N   Statistical significance (critical value) at the .01 level.       Feedback to Patient: Lauren [name] will return on 12/02/2021 for an interactive feedback session with Dr. Sima Matas at which time her test performances, clinical impressions and treatment recommendations will be reviewed in detail. The patient understands she can contact our office should she require our assistance before this time.  150 minutes spent face-to-face with patient administering standardized tests, 30 minutes spent scoring Environmental education officer). [CPT [id number], [id number]]  Full report to follow.

## 2021-07-01 DIAGNOSIS — S92335D Nondisplaced fracture of third metatarsal bone, left foot, subsequent encounter for fracture with routine healing: Secondary | ICD-10-CM | POA: Diagnosis not present

## 2021-07-01 DIAGNOSIS — M79672 Pain in left foot: Secondary | ICD-10-CM | POA: Diagnosis not present

## 2021-07-01 DIAGNOSIS — M79675 Pain in left toe(s): Secondary | ICD-10-CM | POA: Diagnosis not present

## 2021-07-04 ENCOUNTER — Other Ambulatory Visit: Payer: Self-pay | Admitting: Pulmonary Disease

## 2021-07-07 ENCOUNTER — Other Ambulatory Visit: Payer: Self-pay

## 2021-07-07 ENCOUNTER — Ambulatory Visit (INDEPENDENT_AMBULATORY_CARE_PROVIDER_SITE_OTHER): Payer: Medicare Other | Admitting: Psychiatry

## 2021-07-07 DIAGNOSIS — F411 Generalized anxiety disorder: Secondary | ICD-10-CM | POA: Diagnosis not present

## 2021-07-07 DIAGNOSIS — F332 Major depressive disorder, recurrent severe without psychotic features: Secondary | ICD-10-CM | POA: Diagnosis not present

## 2021-07-07 NOTE — Progress Notes (Signed)
Virtual Visit via Telephone Note  I connected with Lauren [name] on 07/07/21 at 2:02 PM EDT  by telephone and verified that I am speaking with the correct person using two identifiers.  Location: Patient: Home Provider: [place] office    I discussed the limitations, risks, security and privacy concerns of performing an evaluation and management service by telephone and the availability of in person appointments. I also discussed with the patient that there may be a patient responsible charge related to this service. The patient expressed understanding and agreed to proceed.     I provided 52 minutes of non-face-to-face time during this encounter.   [name], LCSW        THERAPIST PROGRESS NOTE             Session Time: Wednesday 07/07/2021 2:02 PM - 2:54 PM   Participation Level: Active  Behavioral Response: CasualAlert/depressed, tearful  Type of Therapy: Individual Therapy  Treatment Goals addressed: Patient states wanting to be more positive, complete tasks, and improve organizational skills in adjusting to life without her husband/appropriately grieve loss and return to  previous adaptive level of functioning  Interventions: CBT and Supportive  Summary: Lauren [name] is a 70 y.o. female who presents who is referred for services due to stress, anxiety, and depression. She has participated in therapy intermittently for past 25 years. She reports 3 psychiatric hospitalizations with the last one occuring 4 years ago at [facility].   She reports increased stress related to husband's medical condition as he fell, broke his hip, and was placed in a nursing home.  Patient reports bleeding self for husband falling last year.  She reports having a hard time coping.  Symptoms include shame, blame, guilt, ruminating thoughts, feeling overwhelmed, depressed mood, excessive worry, and anxiety.      Patient last was seen via virtual visit  about 3 months ago.  She reports increase depressed mood, diminished interest/pleasure and activities, isolated behaviors, tearfulness, excessive guilt, and ruminating negative thoughts about self.  Trigger appears to be recent group text with her brothers reminiscing about the year deceased father.  This text occurred on the same day as patient's wedding anniversary which also triggered grief and loss issues as well as loneliness.  Patient reports feeling excluded from her family and has thoughts of being less than as well as thoughts of family not wanting anything to do with her.   Suicidal/Homicidal: Nowithout intent/plan  Therapist Response: reviewed symptoms, discussed stressors, facilitated expression of thoughts and feelings, validated feelings, assisted patient identify triggers of increased symptoms of depression, assisted patient identify connection between thoughts/mood/and behavior, assisted patient identify/challenge/and replace unhelpful thoughts with more helpful thoughts, assisted patient identify activities congruent with her values, developed plan with patient to call her youngest brother, began to discuss next steps for treatment, will do reassessment at next session  Plan: Return again in 2 weeks.  Diagnosis: Axis I: MDD, Recurrent, GAD

## 2021-07-15 ENCOUNTER — Other Ambulatory Visit: Payer: Self-pay | Admitting: Pulmonary Disease

## 2021-07-26 ENCOUNTER — Other Ambulatory Visit (INDEPENDENT_AMBULATORY_CARE_PROVIDER_SITE_OTHER): Payer: Self-pay | Admitting: Gastroenterology

## 2021-07-26 DIAGNOSIS — R197 Diarrhea, unspecified: Secondary | ICD-10-CM

## 2021-07-26 NOTE — Telephone Encounter (Signed)
Last seen 08/06/2020 by Dr. Jenetta Downer

## 2021-07-27 ENCOUNTER — Telehealth (INDEPENDENT_AMBULATORY_CARE_PROVIDER_SITE_OTHER): Payer: Self-pay | Admitting: *Deleted

## 2021-07-27 NOTE — Telephone Encounter (Signed)
Called and discussed with pt - per dr Jenetta Downer Please ask her to restart the Lomoti, but if after a couple of days she is still symptomatic, will need to do stool testing to rule out gastrointestinal infections.  She should also follow-up appointment with in clinic.   Regarding her fatigue, noted to have multiple causes.  It would be important for her to follow-up with her PCP for this.  Pt verbalized understanding of all.

## 2021-07-27 NOTE — Telephone Encounter (Signed)
Please ask her to restart the Lomoti, but if after a couple of days she is still symptomatic, will need to do stool testing to rule out gastrointestinal infections.  She should also follow-up appointment with in clinic.  Regarding her fatigue, noted to have multiple causes.  It would be important for her to follow-up with her PCP for this.

## 2021-07-27 NOTE — Telephone Encounter (Signed)
Pt called states she has been having diarrhea for 3 days and now it is like water. About 2 -3 episodes a day. No blood. Some lower abdominal pain all the way across. No fever. States it like her ibs flare but this time she has nausea that she normally does not have. No vomiting. Not eating much because it goes straight through. She is not taking anything for diarrhea because she ran out of lomotil and they just delivered it to her today. She states she will try it now and see if it helps. States she is also having more fatigue than normal and she took a covid test today and it was negative.   [phone]

## 2021-07-28 ENCOUNTER — Telehealth: Payer: Self-pay | Admitting: Pulmonary Disease

## 2021-07-28 NOTE — Telephone Encounter (Signed)
LMTCB

## 2021-07-30 ENCOUNTER — Other Ambulatory Visit (HOSPITAL_COMMUNITY): Payer: Self-pay

## 2021-07-30 NOTE — Telephone Encounter (Signed)
Lauren [name] is in this same class drug. It returns a co-pay of $124.47. Pt is in Coverage Gap (donut hole) with a balance of $309.87. This would explain her high co-pay for Trelegy of $127.62.

## 2021-07-30 NOTE — Telephone Encounter (Signed)
Will route to Pharmacy to see what other options of medications that her insurance will cover.  thanks

## 2021-08-02 ENCOUNTER — Telehealth: Payer: Self-pay | Admitting: Pulmonary Disease

## 2021-08-02 MED ORDER — TRELEGY ELLIPTA 100-62.5-25 MCG/ACT IN AEPB: 1.0000 | INHALATION_SPRAY | Freq: Every day | RESPIRATORY_TRACT | 0 refills | Status: DC

## 2021-08-02 NOTE — Telephone Encounter (Signed)
VS please advise. Thanks! 

## 2021-08-02 NOTE — Telephone Encounter (Signed)
Called and spoke to patient. Advised patient that we do have trelegy 200 samples here for her to come pick up whenever is convenient for her. Pt voiced understanding and will come pick up today. Nothing further needed.

## 2021-08-03 NOTE — Telephone Encounter (Signed)
Can we give her a couple of samples of trelegy to get her through until January 2023 when the donut hole resets?

## 2021-08-04 NOTE — Telephone Encounter (Signed)
This is a duplicate message. Please see phone note from 11/28. Patient will be going by [place] office to pick up samples.   Nothing further needed at this time.

## 2021-08-05 DIAGNOSIS — Z961 Presence of intraocular lens: Secondary | ICD-10-CM | POA: Diagnosis not present

## 2021-08-05 DIAGNOSIS — H524 Presbyopia: Secondary | ICD-10-CM | POA: Diagnosis not present

## 2021-08-05 DIAGNOSIS — E119 Type 2 diabetes mellitus without complications: Secondary | ICD-10-CM | POA: Diagnosis not present

## 2021-08-05 DIAGNOSIS — H52203 Unspecified astigmatism, bilateral: Secondary | ICD-10-CM | POA: Diagnosis not present

## 2021-08-06 DIAGNOSIS — M797 Fibromyalgia: Secondary | ICD-10-CM | POA: Diagnosis not present

## 2021-08-06 DIAGNOSIS — I10 Essential (primary) hypertension: Secondary | ICD-10-CM | POA: Diagnosis not present

## 2021-08-06 DIAGNOSIS — E782 Mixed hyperlipidemia: Secondary | ICD-10-CM | POA: Diagnosis not present

## 2021-08-06 DIAGNOSIS — Z23 Encounter for immunization: Secondary | ICD-10-CM | POA: Diagnosis not present

## 2021-08-06 DIAGNOSIS — I8393 Asymptomatic varicose veins of bilateral lower extremities: Secondary | ICD-10-CM | POA: Diagnosis not present

## 2021-08-06 DIAGNOSIS — E1169 Type 2 diabetes mellitus with other specified complication: Secondary | ICD-10-CM | POA: Diagnosis not present

## 2021-08-09 ENCOUNTER — Ambulatory Visit (INDEPENDENT_AMBULATORY_CARE_PROVIDER_SITE_OTHER): Payer: Medicare Other | Admitting: Psychiatry

## 2021-08-09 ENCOUNTER — Other Ambulatory Visit: Payer: Self-pay

## 2021-08-09 DIAGNOSIS — F332 Major depressive disorder, recurrent severe without psychotic features: Secondary | ICD-10-CM
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
## 2021-08-18 DIAGNOSIS — M6530 Trigger finger, unspecified finger: Secondary | ICD-10-CM | POA: Diagnosis not present

## 2021-08-18 DIAGNOSIS — M79642 Pain in left hand: Secondary | ICD-10-CM | POA: Diagnosis not present

## 2021-08-18 DIAGNOSIS — M1990 Unspecified osteoarthritis, unspecified site: Secondary | ICD-10-CM | POA: Diagnosis not present

## 2021-08-18 DIAGNOSIS — M25539 Pain in unspecified wrist: Secondary | ICD-10-CM | POA: Diagnosis not present

## 2021-08-18 DIAGNOSIS — M810 Age-related osteoporosis without current pathological fracture: Secondary | ICD-10-CM | POA: Diagnosis not present

## 2021-08-18 DIAGNOSIS — M1380 Other specified arthritis, unspecified site: Secondary | ICD-10-CM | POA: Diagnosis not present

## 2021-08-18 DIAGNOSIS — M79643 Pain in unspecified hand: Secondary | ICD-10-CM | POA: Diagnosis not present

## 2021-08-18 DIAGNOSIS — E119 Type 2 diabetes mellitus without complications: Secondary | ICD-10-CM | POA: Diagnosis not present

## 2021-08-23 ENCOUNTER — Other Ambulatory Visit: Payer: Self-pay

## 2021-08-23 ENCOUNTER — Ambulatory Visit (INDEPENDENT_AMBULATORY_CARE_PROVIDER_SITE_OTHER): Payer: Medicare Other | Admitting: Psychiatry

## 2021-08-23 ENCOUNTER — Encounter (HOSPITAL_COMMUNITY): Payer: Self-pay

## 2021-08-23 DIAGNOSIS — F411 Generalized anxiety disorder: Secondary | ICD-10-CM | POA: Diagnosis not present

## 2021-08-23 DIAGNOSIS — F332 Major depressive disorder, recurrent severe without psychotic features: Secondary | ICD-10-CM

## 2021-08-23 NOTE — Progress Notes (Signed)
Virtual Visit via Telephone Note  I connected with Lauren [name] on 08/23/21 at 1:06 PM EST by telephone and verified that I am speaking with the correct person using two identifiers.  Location: Patient:Home Provider: [place] office    I discussed the limitations, risks, security and privacy concerns of performing an evaluation and management service by telephone and the availability of in person appointments. I also discussed with the patient that there may be a patient responsible charge related to this service. The patient expressed understanding and agreed to proceed.    I provided 46 minutes of non-face-to-face time during this encounter.   [name], LCSW          THERAPIST PROGRESS NOTE             Session Time:  Monday 08/23/2021 1:06 PM  - 1:52 PM   Participation Level: Active  Behavioral Response: CasualAlert/ less depressed, anxious  Type of Therapy: Individual Therapy  Treatment Goals addressed: Patient states wanting to be more positive, complete tasks, and improve organizational skills in adjusting to life without her husband/appropriately grieve loss and return to  previous adaptive level of functioning  Interventions: CBT and Supportive  Summary: Lauren [name] is a 70 y.o. female who is referred for services due to stress, anxiety, and depression.  She has participated in therapy intermittently for the past 25 years.  She reports 3 psychiatric hospitalizations with the last 1 occurring over 5 years ago at [facility].  Patient continues to deal with grief and loss issues regarding the death of her husband last year.  She also reports ruminations, difficulty completing tasks at home, depressed mood, and irritability.  She reports staying at home more as she has memory difficulty and is embarrassed about this.  She also reports fears that she may have to go to a nursing home should she have dementia.  She is scheduled  for a follow-up appointment with psychologist regarding recent cognitive testing.         Patient last was seen via virtual visit about 2-3 ago.  She reports continued symptoms of depression and anxiety including rumination, indecisiveness, depressed mood, and negative statements about self including being less than other people however, she has increased behavioral activation although she reports still having to push self at times.  She has gone shopping, gone out to dinner with friends, and attended church.  She is looking forward to celebrating Christmas with her nephew and his family.  She continues to miss husband.   Suicidal/Homicidal: Nowithout intent/plan  Therapist Response: reviewed symptoms, praised and reinforced patient's increased behavioral activation and socialization, discussed stressors, facilitated expression of thoughts and feelings, validated feelings, develop treatment plan, obtained patient's permission to initial plan for patient as this is a virtual visit, will send patient copy of plan via mail, began to assist patient identify her thought patterns, reviewed lapse versus relapse of depression, assisted patient identify ways to set maintain limits regarding possible interaction at an upcoming family event  Plan: Return again in 2 weeks.  Diagnosis: Axis I: MDD, Recurrent, GAD

## 2021-08-23 NOTE — Plan of Care (Signed)
Pt participated in development of plan

## 2021-09-01 ENCOUNTER — Other Ambulatory Visit: Payer: Self-pay

## 2021-09-01 ENCOUNTER — Telehealth: Payer: Self-pay | Admitting: Pulmonary Disease

## 2021-09-01 ENCOUNTER — Other Ambulatory Visit: Payer: Self-pay | Admitting: Pulmonary Disease

## 2021-09-01 MED ORDER — TRELEGY ELLIPTA 100-62.5-25 MCG/ACT IN AEPB: 1.0000 | INHALATION_SPRAY | Freq: Every day | RESPIRATORY_TRACT | 0 refills | Status: DC

## 2021-09-01 NOTE — Progress Notes (Signed)
Sample for trelegy 200 but had to place order under sample 100.

## 2021-09-01 NOTE — Progress Notes (Unsigned)
Called patient to let her know we do have trelegy 200 samples in our office that are available if she wants to come get one. Patient will be here tomorrow to pick up.

## 2021-09-02 NOTE — Telephone Encounter (Signed)
Called and spoke to patient. Trelegy 200 samples are at the front ready for her to be picked up.

## 2021-09-08 ENCOUNTER — Ambulatory Visit (INDEPENDENT_AMBULATORY_CARE_PROVIDER_SITE_OTHER): Payer: Medicare Other | Admitting: Psychiatry

## 2021-09-08 ENCOUNTER — Other Ambulatory Visit: Payer: Self-pay

## 2021-09-08 DIAGNOSIS — F411 Generalized anxiety disorder: Secondary | ICD-10-CM | POA: Diagnosis not present

## 2021-09-08 DIAGNOSIS — F332 Major depressive disorder, recurrent severe without psychotic features: Secondary | ICD-10-CM

## 2021-09-08 NOTE — Progress Notes (Signed)
Virtual Visit via Telephone Note  I connected with [name] on 09/08/21 at 1:06 PM  by telephone and verified that I am speaking with the correct person using two identifiers.  Location: Patient: Home Provider: [place] office    I discussed the limitations, risks, security and privacy concerns of performing an evaluation and management service by telephone and the availability of in person appointments. I also discussed with the patient that there may be a patient responsible charge related to this service. The patient expressed understanding and agreed to proceed.   I provided 49 minutes of non-face-to-face time during this encounter.   [name], LCSW           THERAPIST PROGRESS NOTE             Session Time:  Wednesday  09/08/2020 1:06 PM - 1:55 PM   Participation Level: Active  Behavioral Response: CasualAlert/ less depressed, anxious  Type of Therapy: Individual Therapy  Treatment Goals addressed: Patient states wanting to be more positive, complete tasks, and improve organizational skills in adjusting to life without her husband/appropriately grieve loss and return to  previous adaptive level of functioning  Interventions: CBT and Supportive  Summary: Lauren [name] is a 71 y.o. female who is referred for services due to stress, anxiety, and depression.  She has participated in therapy intermittently for the past 25 years.  She reports 3 psychiatric hospitalizations with the last 1 occurring over 5 years ago at [facility].  Patient continues to deal with grief and loss issues regarding the death of her husband last year.  She also reports ruminations, difficulty completing tasks at home, depressed mood, and irritability.  She reports staying at home more as she has memory difficulty and is embarrassed about this.  She also reports fears that she may have to go to a nursing home should she have dementia.  She is scheduled for  a follow-up appointment with psychologist regarding recent cognitive testing.         Patient last was seen via virtual visit about 3 weeks ago. She reports continued symptoms of depression and anxiety including rumination, indecisiveness, depressed mood, and negative statements about self including being less than other people. She enjoyed celebrating Christmas with her nephew and his family.  However, she reports becoming more depressed and more isolated after then due to an incident at church as well as frustration with her friend.  Patient reports these situations increased thoughts of being less than.  She also started having more thoughts about her husband and her deceased pet.  She reports not feeling well today as she has some type of infection.    Suicidal/Homicidal: Nowithout intent/plan  Therapist Response: reviewed symptoms, discussed stressors, facilitated expression of thoughts and feelings, validated feelings and normalized feelings related to grief and loss, reviewed the cycle of depression and the role of behavioral activation to overcome depression, developed plan with patient to resume daily planning, assisted patient began to identify connection between thoughts/mood/behavior, also assisted patient distinguish between thoughts and emotions, assisted patient identify/challenge/and replace negative thoughts regarding recent interactions with more helpful/rational thoughts, will send patient handouts (cognitive model, cognitive model practice exercises) in preparation for next session   Plan: Return again in 2 weeks.  Diagnosis: Axis I: MDD, Recurrent, GAD

## 2021-09-09 ENCOUNTER — Encounter: Payer: Self-pay | Admitting: Emergency Medicine

## 2021-09-09 ENCOUNTER — Ambulatory Visit
Admission: EM | Admit: 2021-09-09 | Discharge: 2021-09-09 | Disposition: A | Discharge status: Home / Self Care | Payer: Medicare Other | Attending: Family Medicine | Admitting: Family Medicine

## 2021-09-09 ENCOUNTER — Other Ambulatory Visit: Payer: Self-pay

## 2021-09-09 ENCOUNTER — Ambulatory Visit (INDEPENDENT_AMBULATORY_CARE_PROVIDER_SITE_OTHER): Payer: Medicare Other

## 2021-09-09 DIAGNOSIS — R059 Cough, unspecified: Secondary | ICD-10-CM

## 2021-09-09 DIAGNOSIS — Z1152 Encounter for screening for COVID-19: Secondary | ICD-10-CM | POA: Diagnosis not present

## 2021-09-09 DIAGNOSIS — J0100 Acute maxillary sinusitis, unspecified: Secondary | ICD-10-CM | POA: Diagnosis not present

## 2021-09-09 DIAGNOSIS — J209 Acute bronchitis, unspecified: Secondary | ICD-10-CM

## 2021-09-09 DIAGNOSIS — R0602 Shortness of breath: Secondary | ICD-10-CM

## 2021-09-09 MED ORDER — GUAIFENESIN ER 600 MG PO TB12: 600.0000 mg | ORAL_TABLET | Freq: Two times a day (BID) | ORAL | 0 refills | Status: AC

## 2021-09-09 MED ORDER — BENZONATATE 100 MG PO CAPS: 100.0000 mg | ORAL_CAPSULE | Freq: Three times a day (TID) | ORAL | 0 refills | Status: DC | PRN

## 2021-09-09 NOTE — ED Triage Notes (Addendum)
Pt c/o cough, chest congestion, fever, bodyaches, sinus pressure and bilateral ear pain x 6 days. Pt did a tele-health visit today and was prescribed azithromycin and amox-clav [id number].

## 2021-09-09 NOTE — Discharge Instructions (Addendum)
This take medications as prescribed Continue using albuterol inhaler Saline nasal spray Humidifier use at bedtime will help with nasal congestion Your chest x-ray is negative for pneumonia Return to urgent care if symptoms worsen

## 2021-09-10 LAB — COVID-19, FLU A+B NAA
Influenza A, NAA: NOT DETECTED
Influenza B, NAA: NOT DETECTED
SARS-CoV-2, NAA: NOT DETECTED

## 2021-09-10 NOTE — ED Provider Notes (Signed)
[facility]    CSN: [id number] Arrival date & time: 09/09/21  1559      History   Chief Complaint Chief Complaint  Patient presents with   Cough   Otalgia   sinus pressure   Fever    HPI Lauren [name] is a 71 y.o. female comes to the urgent care with 6 days history of cough, generalized body aches, subjective fever, chills, sinus pressure and bilateral ear pain.  Patient's symptoms started 6 days ago and has been worsening.  Cough is productive of some brownish sputum.  She endorses postnasal drip.  No shortness of breath or chest tightness.  No nausea, vomiting or diarrhea.  No shortness of breath or wheezing.  Patient did a telemedicine visit today and she was prescribed azithromycin and Augmentin.   HPI  Past Medical History:  Diagnosis Date   Anxiety    Chronic pain    COPD (chronic obstructive pulmonary disease) (HCC)    Depression    Fibromyalgia    Fibromyalgia    GERD (gastroesophageal reflux disease)    Headache    Hepatitis C    History of blood transfusion    History of bronchitis    IBS (irritable bowel syndrome)    Osteoarthritis    Pneumonia    Prediabetes    Shortness of breath dyspnea    allergies; increased pain;    Vertigo    Wears glasses     Patient Active Problem List   Diagnosis Date Noted   Dysphagia 08/06/2020   Shock ([place]) 09/24/2019   Anxiety    COPD (chronic obstructive pulmonary disease) ([place])    AKI (acute kidney injury) ([place])    Acute medial meniscal tear 10/21/2015   GERD (gastroesophageal reflux disease) 01/21/2015   Fibromyalgia 09/11/2014   MDD (major depressive disorder) 03/19/2014   Pain 07/25/2012   Insomnia due to mental disorder 07/25/2012   OA (osteoarthritis) of knee 02/14/2012   Hepatitis C 02/14/2012   IBS (irritable bowel syndrome) 02/14/2012   Depression 12/27/2011    Past Surgical History:  Procedure Laterality Date   BIOPSY  09/16/2020   Procedure: BIOPSY;  Surgeon: Harvel Quale, MD;  [facility]: [facility];  Service: Gastroenterology;;   CATARACT EXTRACTION W/PHACO Right 08/01/2017   Procedure: CATARACT EXTRACTION PHACO AND INTRAOCULAR LENS PLACEMENT RIGHT EYE;  Surgeon: Rutherford Guys, MD;  Location: [facility];  Service: Ophthalmology;  Laterality: Right;  CDE: 3.30   CATARACT EXTRACTION W/PHACO Left 08/15/2017   Procedure: CATARACT EXTRACTION PHACO AND INTRAOCULAR LENS PLACEMENT (IOC);  Surgeon: Rutherford Guys, MD;  Location: [facility];  Service: Ophthalmology;  Laterality: Left;  CDE: 4.00   CHOLECYSTECTOMY     COLONOSCOPY     COLONOSCOPY N/A 10/23/2014   Procedure: COLONOSCOPY;  Surgeon: Rogene Houston, MD;  [facility];  Service: Endoscopy;  Laterality: N/A;  1030   ESOPHAGOGASTRODUODENOSCOPY N/A 10/23/2014   Procedure: ESOPHAGOGASTRODUODENOSCOPY (EGD);  Surgeon: Rogene Houston, MD;  [facility];  Service: Endoscopy;  Laterality: N/A;   ESOPHAGOGASTRODUODENOSCOPY (EGD) WITH PROPOFOL N/A 09/16/2020   Procedure: ESOPHAGOGASTRODUODENOSCOPY (EGD) WITH PROPOFOL;  Surgeon: Harvel Quale, MD;  [facility];  Service: Gastroenterology;  Laterality: N/A;  7:30   KNEE ARTHROSCOPY Left 10/21/2015   Procedure: ARTHROSCOPY LEFT KNEE WITH MENICAL DEBRIDEMENT, Chondroplasty;  Surgeon: Gaynelle Arabian, MD;  Location: [facility];  Service: Orthopedics;  Laterality: Left;   MANDIBLE SURGERY  09/06/1979   ORIF WRIST FRACTURE Right 07/23/2013  Procedure: OPEN REDUCTION INTERNAL FIXATION (ORIF) WRIST FRACTURE;  Surgeon: Roseanne Kaufman, MD;  Location: [place];  Service: Orthopedics;  Laterality: Right;   SAVORY DILATION  09/16/2020   Procedure: SAVORY DILATION;  Surgeon: Montez Morita, Quillian Quince, MD;  [facility];  Service: Gastroenterology;;   TONSILLECTOMY     TOTAL KNEE ARTHROPLASTY     2011 rt knee   TOTAL KNEE ARTHROPLASTY Left 04/16/2018   Procedure: LEFT TOTAL KNEE ARTHROPLASTY;  Surgeon: Gaynelle Arabian, MD;  Location: [facility];   Service: Orthopedics;  Laterality: Left;   UPPER GASTROINTESTINAL ENDOSCOPY      OB History   No obstetric history on file.      Home Medications    Prior to Admission medications   Medication Sig Start Date End Date Taking? Authorizing Provider  benzonatate (TESSALON) 100 MG capsule Take 1 capsule (100 mg total) by mouth 3 (three) times daily as needed for cough. 09/09/21  Yes Jaicee Michelotti, Myrene Galas, MD  guaiFENesin (MUCINEX) 600 MG 12 hr tablet Take 1 tablet (600 mg total) by mouth 2 (two) times daily for 10 days. 09/09/21 09/19/21 Yes Kanylah Muench, Myrene Galas, MD  albuterol (PROAIR HFA) 108 (90 Base) MCG/ACT inhaler Inhale 2 puffs into the lungs every 4 (four) hours as needed for wheezing or shortness of breath. 04/08/21   Chesley Mires, MD  Biotin w/ Vitamins C & E (HAIR/SKIN/NAILS PO) Take 2 tablets by mouth daily.    [provider]  calcium-vitamin D (OSCAL WITH D) 500-200 MG-UNIT per tablet Take 1 tablet by mouth 2 (two) times daily. For low calcium 03/21/14   Lindell Spar I, NP  Cholecalciferol (VITAMIN D-3) 25 MCG (1000 UT) CAPS Take 1 capsule by mouth daily.    [provider]  Cyanocobalamin (B-12) 2000 MCG TABS Take 2,000 mcg by mouth daily.    [provider]  diclofenac sodium (VOLTAREN) 1 % GEL Apply 2 g topically 4 (four) times daily as needed (For pain.).  07/09/15   [provider]  dicyclomine (BENTYL) 20 MG tablet Take 20 mg by mouth 4 (four) times daily as needed. 10/20/20   [provider]  diphenoxylate-atropine (LOMOTIL) 2.5-0.025 MG tablet TAKE ONE TABLET BY MOUTH THREE TIMES DAILY AS NEEDED 07/26/21   Montez Morita, Quillian Quince, MD  DM-APAP-CPM (CORICIDIN HBP) 10-325-2 MG TABS Take by mouth.    [provider]  fluticasone (FLONASE) 50 MCG/ACT nasal spray Place 2 sprays into both nostrils in the morning and at bedtime.    [provider]  Fluticasone-Umeclidin-Vilant (TRELEGY ELLIPTA) 100-62.5-25 MCG/ACT AEPB Inhale 1 puff  into the lungs daily. 08/02/21   Chesley Mires, MD  Fluticasone-Umeclidin-Vilant (TRELEGY ELLIPTA) 100-62.5-25 MCG/ACT AEPB Inhale 1 puff into the lungs daily. 09/01/21   Chesley Mires, MD  Fluticasone-Umeclidin-Vilant (TRELEGY ELLIPTA) 200-62.5-25 MCG/INH AEPB Inhale 1 puff into the lungs daily. 03/19/21   Chesley Mires, MD  gabapentin (NEURONTIN) 100 MG capsule Take 200-300 mg by mouth See admin instructions. Take 200mg  by mouth every day in the morning and take 300mg  by mouth at bedtime 08/19/19   [provider]  lamoTRIgine (LAMICTAL) 100 MG tablet Take 1 tablet (100 mg total) by mouth 2 (two) times daily. 12/28/20   Cloria Spring, MD  LORazepam (ATIVAN) 1 MG tablet Take 1 tablet (1 mg total) by mouth at bedtime. 12/28/20   Cloria Spring, MD  losartan (COZAAR) 25 MG tablet Take 25 mg by mouth daily.    [provider]  metroNIDAZOLE (METROGEL) 1 %  gel Apply 1 application topically daily as needed.    [provider]  montelukast (SINGULAIR) 10 MG tablet TAKE ONE TABLET BY MOUTH EVERYDAY AT BEDTIME 07/15/21   Chesley Mires, MD  Multiple Vitamins-Minerals (MULTIVITAMIN WITH MINERALS) tablet Take 1 tablet daily by mouth.    [provider]  Omega-3 Fatty Acids (FISH OIL) 1000 MG CPDR Take 1 capsule by mouth daily.    [provider]  OVER THE COUNTER MEDICATION Take 1 tablet by mouth daily at 8 pm. Calcium, Magnesium, and Zinc once per day.    [provider]  OVER THE COUNTER MEDICATION Take 1 tablet by mouth daily. Garlic Extract 9629 mg once per day.    [provider]  OVER THE COUNTER MEDICATION Take 1 tablet by mouth daily. Vitamin D3 25 mcg (1000 IU) once per day.    [provider]  pantoprazole (PROTONIX) 40 MG tablet Take 40 mg by mouth 2 (two) times daily.    [provider]  rosuvastatin (CRESTOR) 10 MG tablet Take 10 mg by mouth daily.    [provider]  Turmeric 450 MG CAPS Take 450 mg by mouth  daily.    [provider]  venlafaxine XR (EFFEXOR XR) 75 MG 24 hr capsule Take 3 capsules (225 mg total) by mouth daily with breakfast. 12/28/20   Cloria Spring, MD  vitamin E 400 UNIT capsule Take 400 Units daily by mouth.    [provider]    Family History Family History  Problem Relation Age of Onset   Alcohol abuse Father    Diabetes Father    Stroke Father    Depression Father    Hypertension Father    Heart attack Father    High Cholesterol Father    Alcohol abuse Maternal Grandfather    Alcohol abuse Maternal Grandmother    Hypertension Maternal Grandmother    Alcohol abuse Paternal Grandfather    Alcohol abuse Paternal Grandmother    Diabetes Paternal Grandmother    Stroke Mother    Irritable bowel syndrome Mother    Sexual abuse Mother    Hypertension Mother    Breast cancer Mother    Diabetes Brother    Healthy Brother    Diabetes Brother    Heart disease Brother    Sarcoidosis Brother    Heart failure Brother    Anxiety disorder Paternal Uncle    Alcohol abuse Paternal Uncle    Alcohol abuse Cousin    Anxiety disorder Maternal Uncle    ADD / ADHD Neg Hx    Bipolar disorder Neg Hx    Dementia Neg Hx    Drug abuse Neg Hx    Paranoid behavior Neg Hx    Schizophrenia Neg Hx    Seizures Neg Hx    Physical abuse Neg Hx     Social History Social History   Tobacco Use   Smoking status: Former    Packs/day: 2.00    Years: 15.00    Pack years: 30.00    Types: Cigarettes    Quit date: 09/05/1982    Years since quitting: 39.0   Smokeless tobacco: Never  Vaping Use   Vaping Use: Never used  Substance Use Topics   Alcohol use: No    Alcohol/week: 0.0 standard drinks    Comment: history of alcoholism; pt states has been sober 28 years   Drug use: No     Allergies   Elavil [amitriptyline], Flagyl [metronidazole], Vistaril [hydroxyzine hcl],  Geodon [ziprasidone hydrochloride], Lactose intolerance (gi), Latex, and Other   Review of  Systems Review of Systems  Constitutional: Negative.   HENT:  Positive for congestion, postnasal drip, sinus pressure, sinus pain and sore throat.   Eyes: Negative.   Respiratory:  Positive for cough. Negative for shortness of breath and wheezing.   Cardiovascular:  Positive for chest pain.  Gastrointestinal: Negative.  Negative for nausea and vomiting.  Genitourinary: Negative.   Musculoskeletal: Negative.   Neurological: Negative.     Physical Exam Triage Vital Signs ED Triage Vitals [09/09/21 1739]  Enc Vitals Group     BP 128/79     Pulse Rate 98     Resp 16     Temp 98.6 F (37 C)     Temp Source Oral     SpO2 95 %     Weight      Height      Head Circumference      Peak Flow      Pain Score 8     Pain Loc      Pain Edu?      Excl. in [place]?    No data found.  Updated Vital Signs BP 128/79 (BP Location: Right Arm)    Pulse 98    Temp 98.6 F (37 C) (Oral)    Resp 16    SpO2 95%   Visual Acuity Right Eye Distance:   Left Eye Distance:   Bilateral Distance:    Right Eye Near:   Left Eye Near:    Bilateral Near:     Physical Exam Vitals and nursing note reviewed.  Constitutional:      General: She is not in acute distress.    Appearance: She is not ill-appearing.  HENT:     Right Ear: Tympanic membrane normal.     Left Ear: Tympanic membrane normal.     Mouth/Throat:     Mouth: Mucous membranes are moist.     Pharynx: Posterior oropharyngeal erythema present.     Comments: Postnasal drainage seen in the posterior pharynx Eyes:     Extraocular Movements: Extraocular movements intact.     Pupils: Pupils are equal, round, and reactive to light.  Cardiovascular:     Rate and Rhythm: Normal rate and regular rhythm.     Pulses: Normal pulses.     Heart sounds: Normal heart sounds.  Pulmonary:     Effort: Pulmonary effort is normal. No respiratory distress.     Breath sounds: Normal breath sounds. No stridor. No wheezing.  Abdominal:     General: Bowel  sounds are normal.     Palpations: Abdomen is soft.  Neurological:     Mental Status: She is alert.     UC Treatments / Results  Labs (all labs ordered are listed, but only abnormal results are displayed) Labs Reviewed  COVID-19, FLU A+B NAA    EKG   Radiology DG Chest 2 View  Result Date: 09/09/2021 CLINICAL DATA:  Shortness of breath and cough in a 71 year old female. EXAM: CHEST - 2 VIEW COMPARISON:  November 03, 2020. FINDINGS: The heart size and mediastinal contours are within normal limits. Both lungs are clear. The visualized skeletal structures are unremarkable. IMPRESSION: No active cardiopulmonary disease. Electronically Signed   By: Zetta Bills M.D.   On: 09/09/2021 18:16    Procedures Procedures (including critical care time)  Medications Ordered in UC Medications - No data to display  Initial Impression / Assessment and Plan /  UC Course  I have reviewed the triage vital signs and the nursing notes.  Pertinent labs & imaging results that were available during my care of the patient were reviewed by me and considered in my medical decision making (see chart for details).     1.  Acute bronchitis: Chest x-ray is negative for acute lung infiltrate Continue taking antibiotics as prescribed by primary care physician Mucinex as needed Warm salt water gargles will help with sore throat Saline nasal spray Tessalon Perles as needed for cough No indication for COVID or flu testing given the duration of symptoms Return precautions given. Final Clinical Impressions(s) / UC Diagnoses   Final diagnoses:  Acute bronchitis, unspecified organism  Acute maxillary sinusitis, recurrence not specified     Discharge Instructions      This take medications as prescribed Continue using albuterol inhaler Saline nasal spray Humidifier use at bedtime will help with nasal congestion Your chest x-ray is negative for pneumonia Return to urgent care if symptoms worsen    ED  Prescriptions     Medication Sig Dispense Auth. Provider   benzonatate (TESSALON) 100 MG capsule Take 1 capsule (100 mg total) by mouth 3 (three) times daily as needed for cough. 21 capsule Ramie Palladino, Myrene Galas, MD   guaiFENesin (MUCINEX) 600 MG 12 hr tablet Take 1 tablet (600 mg total) by mouth 2 (two) times daily for 10 days. 20 tablet Gabriel Conry, Myrene Galas, MD      PDMP not reviewed this encounter.   Chase Picket, MD 09/10/21 [phone]

## 2021-09-20 ENCOUNTER — Other Ambulatory Visit (HOSPITAL_COMMUNITY): Payer: Self-pay | Admitting: Psychiatry

## 2021-09-20 NOTE — Telephone Encounter (Signed)
Call for appt

## 2021-09-22 ENCOUNTER — Other Ambulatory Visit: Payer: Self-pay

## 2021-09-22 ENCOUNTER — Ambulatory Visit (INDEPENDENT_AMBULATORY_CARE_PROVIDER_SITE_OTHER): Payer: Medicare Other | Admitting: Psychiatry

## 2021-09-22 DIAGNOSIS — F332 Major depressive disorder, recurrent severe without psychotic features: Secondary | ICD-10-CM | POA: Diagnosis not present

## 2021-09-22 NOTE — Progress Notes (Signed)
Virtual Visit via Telephone Note  I connected with Lauren [name] on 09/22/21 at 2:10 PM EST  by telephone and verified that I am speaking with the correct person using two identifiers.  Location: Patient: Home Provider: [place] office    I discussed the limitations, risks, security and privacy concerns of performing an evaluation and management service by telephone and the availability of in person appointments. I also discussed with the patient that there may be a patient responsible charge related to this service. The patient expressed understanding and agreed to proceed.    I provided 47 minutes of non-face-to-face time during this encounter.   [name], LCSW           THERAPIST PROGRESS NOTE             Session Time:  Wednesday  09/22/2021 2:10 PM - 2:57 PM   Participation Level: Active  Behavioral Response: CasualAlert/ less depressed, anxious  Type of Therapy: Individual Therapy  Treatment Goals addressed: Patient states wanting to be more positive, complete tasks, and improve organizational skills in adjusting to life without her husband/appropriately grieve loss and return to  previous adaptive level of functioning  Interventions: CBT and Supportive  Summary: Lauren [name] is a 71 y.o. female who is referred for services due to stress, anxiety, and depression.  She has participated in therapy intermittently for the past 25 years.  She reports 3 psychiatric hospitalizations with the last 1 occurring over 5 years ago at [facility].  Patient continues to deal with grief and loss issues regarding the death of her husband last year.  She also reports ruminations, difficulty completing tasks at home, depressed mood, and irritability.  She reports staying at home more as she has memory difficulty and is embarrassed about this.  She also reports fears that she may have to go to a nursing home should she have dementia.  She is  scheduled for a follow-up appointment with psychologist regarding recent cognitive testing.         Patient last was seen via virtual visit about 3 weeks ago. She reports improved mood since last session.  She is feeling better physically and has begun to increase her involvement and activity.  She reports completing small household tasks and visiting a neighbor.  She also has begun to initiate more contact with people via phone.  She has not resumed attending church or attending her group as several people who attend these places with patient have been diagnosed with COVID.  Patient continues to report ruminating negative thoughts about self and interaction with others.  She reports being very anxious today as she has misplaced her homework and expresses fear of negative response from clinician. Suicidal/Homicidal: Nowithout intent/plan  Therapist Response: reviewed symptoms, praised and reinforced patient's increased behavioral activation and efforts to increase social contacts, discussed effects, assisted patient identify connection between mood/thoughts/feelings using incident today of patient being anxious because she misplaced her homework, assisted patient identify/challenge/and replace unhelpful thoughts with helpful thoughts, provided an overview of the connection between automatic thoughts/beliefs-assumptions/core beliefs, assisted patient to begin to identify examples in her life and the effect on her current functioning, will send patient previously mailed handouts along with handout on cognitive distortions via mail  Plan: Return again in 2 weeks.  Diagnosis: Axis I: MDD, Recurrent, GAD

## 2021-09-24 ENCOUNTER — Other Ambulatory Visit: Payer: Self-pay

## 2021-09-24 ENCOUNTER — Telehealth (HOSPITAL_COMMUNITY): Payer: Medicare Other | Admitting: Psychiatry

## 2021-09-29 ENCOUNTER — Encounter (HOSPITAL_COMMUNITY): Payer: Self-pay | Admitting: Psychiatry

## 2021-09-29 ENCOUNTER — Telehealth (INDEPENDENT_AMBULATORY_CARE_PROVIDER_SITE_OTHER): Payer: Medicare Other | Admitting: Psychiatry

## 2021-09-29 ENCOUNTER — Other Ambulatory Visit: Payer: Self-pay

## 2021-09-29 DIAGNOSIS — F332 Major depressive disorder, recurrent severe without psychotic features: Secondary | ICD-10-CM

## 2021-09-29 DIAGNOSIS — F411 Generalized anxiety disorder: Secondary | ICD-10-CM | POA: Diagnosis not present

## 2021-09-29 MED ORDER — LAMOTRIGINE 100 MG PO TABS: 100.0000 mg | ORAL_TABLET | Freq: Two times a day (BID) | ORAL | 2 refills | Status: DC

## 2021-09-29 MED ORDER — LORAZEPAM 1 MG PO TABS: 1.0000 mg | ORAL_TABLET | Freq: Every day | ORAL | 2 refills | Status: DC

## 2021-09-29 MED ORDER — VENLAFAXINE HCL ER 75 MG PO CP24
225.0000 mg | ORAL_CAPSULE | Freq: Every day | ORAL | 2 refills | Status: DC
Start: 2021-09-29 — End: 2021-12-23

## 2021-09-29 NOTE — Progress Notes (Signed)
Virtual Visit via Telephone Note  I connected with Lauren [name] on 09/29/21 at 10:40 AM EST by telephone and verified that I am speaking with the correct person using two identifiers.  Location: Patient: home Provider: office   I discussed the limitations, risks, security and privacy concerns of performing an evaluation and management service by telephone and the availability of in person appointments. I also discussed with the patient that there may be a patient responsible charge related to this service. The patient expressed understanding and agreed to proceed.     I discussed the assessment and treatment plan with the patient. The patient was provided an opportunity to ask questions and all were answered. The patient agreed with the plan and demonstrated an understanding of the instructions.   The patient was advised to call back or seek an in-person evaluation if the symptoms worsen or if the condition fails to improve as anticipated.  I provided 15 minutes of non-face-to-face time during this encounter.   Levonne Spiller, MD  [facility] MD/PA/NP OP Progress Note  09/29/2021 11:01 AM Lauren [name]  MRN:  [id number]  Chief Complaint:  Chief Complaint   Depression; Anxiety; Follow-up    HPI: This patient is a 71 year old widowed white female who lives alone in [place]. She has one stepchild. Her step son killed himself 12 years ago.Marland Kitchen He was an alcoholic. The patient is on disability for fibromyalgia  Patient returns for follow-up after long absence.  She was last seen about 10 months ago.  She states that she is still very concerned about short-term memory loss and trouble with word finding.  According to the chart she has finished her psychological testing with Ocie Doyne but there is no report yet in the chart.  She is supposed to return in March for a follow-up to go over the results.  The patient states that she is still grieving her husband who died last year.  She is still  seeing therapist Maurice Small here to help cope with this.  The she thinks her medications for depression and anxiety are helping a good deal.  She seemed more scattered than usual today and quite tangential.  I am not sure if this is still due to the grief or if she really is having cognitive decline.  We will need to try to get the results of the testing.  She is still able to do all of the things she needs to do to run her household.  She denies significant depression or suicidal ideation. Visit Diagnosis:    ICD-10-CM   1. Major depressive disorder, recurrent, severe without psychotic features ([place])  F33.2     2. Generalized anxiety disorder  F41.1       Past Psychiatric History: Long history of depression and mood instability she was last hospitalized about 6 years ago for suicidal ideation  Past Medical History:  Past Medical History:  Diagnosis Date   Anxiety    Chronic pain    COPD (chronic obstructive pulmonary disease) (HCC)    Depression    Fibromyalgia    Fibromyalgia    GERD (gastroesophageal reflux disease)    Headache    Hepatitis C    History of blood transfusion    History of bronchitis    IBS (irritable bowel syndrome)    Osteoarthritis    Pneumonia    Prediabetes    Shortness of breath dyspnea    allergies; increased pain;    Vertigo    Wears  glasses     Past Surgical History:  Procedure Laterality Date   BIOPSY  09/16/2020   Procedure: BIOPSY;  Surgeon: Harvel Quale, MD;  [facility];  Service: Gastroenterology;;   CATARACT EXTRACTION W/PHACO Right 08/01/2017   Procedure: CATARACT EXTRACTION PHACO AND INTRAOCULAR LENS PLACEMENT RIGHT EYE;  Surgeon: Rutherford Guys, MD;  Location: [facility];  Service: Ophthalmology;  Laterality: Right;  CDE: 3.30   CATARACT EXTRACTION W/PHACO Left 08/15/2017   Procedure: CATARACT EXTRACTION PHACO AND INTRAOCULAR LENS PLACEMENT (IOC);  Surgeon: Rutherford Guys, MD;  Location: [facility];  Service: Ophthalmology;   Laterality: Left;  CDE: 4.00   CHOLECYSTECTOMY     COLONOSCOPY     COLONOSCOPY N/A 10/23/2014   Procedure: COLONOSCOPY;  Surgeon: Rogene Houston, MD;  [facility];  Service: Endoscopy;  Laterality: N/A;  1030   ESOPHAGOGASTRODUODENOSCOPY N/A 10/23/2014   Procedure: ESOPHAGOGASTRODUODENOSCOPY (EGD);  Surgeon: Rogene Houston, MD;  [facility];  Service: Endoscopy;  Laterality: N/A;   ESOPHAGOGASTRODUODENOSCOPY (EGD) WITH PROPOFOL N/A 09/16/2020   Procedure: ESOPHAGOGASTRODUODENOSCOPY (EGD) WITH PROPOFOL;  Surgeon: Harvel Quale, MD;  [facility];  Service: Gastroenterology;  Laterality: N/A;  7:30   KNEE ARTHROSCOPY Left 10/21/2015   Procedure: ARTHROSCOPY LEFT KNEE WITH MENICAL DEBRIDEMENT, Chondroplasty;  Surgeon: Gaynelle Arabian, MD;  Location: [facility];  Service: Orthopedics;  Laterality: Left;   MANDIBLE SURGERY  09/06/1979   ORIF WRIST FRACTURE Right 07/23/2013   Procedure: OPEN REDUCTION INTERNAL FIXATION (ORIF) WRIST FRACTURE;  Surgeon: Roseanne Kaufman, MD;  Location: [place];  Service: Orthopedics;  Laterality: Right;   SAVORY DILATION  09/16/2020   Procedure: SAVORY DILATION;  Surgeon: Montez Morita, Quillian Quince, MD;  [facility];  Service: Gastroenterology;;   TONSILLECTOMY     TOTAL KNEE ARTHROPLASTY     2011 rt knee   TOTAL KNEE ARTHROPLASTY Left 04/16/2018   Procedure: LEFT TOTAL KNEE ARTHROPLASTY;  Surgeon: Gaynelle Arabian, MD;  Location: [facility];  Service: Orthopedics;  Laterality: Left;   UPPER GASTROINTESTINAL ENDOSCOPY      Family Psychiatric History: see below  Family History:  Family History  Problem Relation Age of Onset   Alcohol abuse Father    Diabetes Father    Stroke Father    Depression Father    Hypertension Father    Heart attack Father    High Cholesterol Father    Alcohol abuse Maternal Grandfather    Alcohol abuse Maternal Grandmother    Hypertension Maternal Grandmother    Alcohol abuse Paternal  Grandfather    Alcohol abuse Paternal Grandmother    Diabetes Paternal Grandmother    Stroke Mother    Irritable bowel syndrome Mother    Sexual abuse Mother    Hypertension Mother    Breast cancer Mother    Diabetes Brother    Healthy Brother    Diabetes Brother    Heart disease Brother    Sarcoidosis Brother    Heart failure Brother    Anxiety disorder Paternal Uncle    Alcohol abuse Paternal Uncle    Alcohol abuse Cousin    Anxiety disorder Maternal Uncle    ADD / ADHD Neg Hx    Bipolar disorder Neg Hx    Dementia Neg Hx    Drug abuse Neg Hx    Paranoid behavior Neg Hx    Schizophrenia Neg Hx    Seizures Neg Hx    Physical abuse Neg Hx     Social  History:  Social History   Socioeconomic History   Marital status: Widowed    Spouse name: Not on file   Number of children: Not on file   Years of education: Not on file   Highest education level: Not on file  Occupational History   Not on file  Tobacco Use   Smoking status: Former    Packs/day: 2.00    Years: 15.00    Pack years: 30.00    Types: Cigarettes    Quit date: 09/05/1982    Years since quitting: 39.0   Smokeless tobacco: Never  Vaping Use   Vaping Use: Never used  Substance and Sexual Activity   Alcohol use: No    Alcohol/week: 0.0 standard drinks    Comment: history of alcoholism; pt states has been sober 28 years   Drug use: No   Sexual activity: Never  Other Topics Concern   Not on file  Social History Narrative   Not on file   Social Determinants of Health   Financial Resource Strain: Not on file  Food Insecurity: Not on file  Transportation Needs: Not on file  Physical Activity: Not on file  Stress: Not on file  Social Connections: Not on file    Allergies:  Allergies  Allergen Reactions   Elavil [Amitriptyline] Other (See Comments)    Vivid dreams and almost suicidal   Flagyl [Metronidazole] Itching   Vistaril [Hydroxyzine Hcl] Other (See Comments)    Got higher than a kite on  too much of this.   Geodon [Ziprasidone Hydrochloride] Other (See Comments)    Blacked out   Lactose Intolerance (Gi) Other (See Comments)    GI upset   Latex Other (See Comments)    Red at site   Other Other (See Comments)    Metabolic Disorder Labs: Lab Results  Component Value Date   HGBA1C 6.1 (H) 10/13/2015   MPG 128 10/13/2015   No results found for: PROLACTIN No results found for: CHOL, TRIG, HDL, CHOLHDL, VLDL, LDLCALC Lab Results  Component Value Date   TSH 2.400 03/19/2014    Therapeutic Level Labs: No results found for: LITHIUM No results found for: VALPROATE No components found for:  CBMZ  Current Medications: Current Outpatient Medications  Medication Sig Dispense Refill   albuterol (PROAIR HFA) 108 (90 Base) MCG/ACT inhaler Inhale 2 puffs into the lungs every 4 (four) hours as needed for wheezing or shortness of breath. 1 each 3   benzonatate (TESSALON) 100 MG capsule Take 1 capsule (100 mg total) by mouth 3 (three) times daily as needed for cough. 21 capsule 0   Biotin w/ Vitamins C & E (HAIR/SKIN/NAILS PO) Take 2 tablets by mouth daily.     calcium-vitamin D (OSCAL WITH D) 500-200 MG-UNIT per tablet Take 1 tablet by mouth 2 (two) times daily. For low calcium     Cholecalciferol (VITAMIN D-3) 25 MCG (1000 UT) CAPS Take 1 capsule by mouth daily.     Cyanocobalamin (B-12) 2000 MCG TABS Take 2,000 mcg by mouth daily.     diclofenac sodium (VOLTAREN) 1 % GEL Apply 2 g topically 4 (four) times daily as needed (For pain.).      dicyclomine (BENTYL) 20 MG tablet Take 20 mg by mouth 4 (four) times daily as needed.     diphenoxylate-atropine (LOMOTIL) 2.5-0.025 MG tablet TAKE ONE TABLET BY MOUTH THREE TIMES DAILY AS NEEDED 60 tablet 5   DM-APAP-CPM (CORICIDIN HBP) 10-325-2 MG TABS Take by mouth.  fluticasone (FLONASE) 50 MCG/ACT nasal spray Place 2 sprays into both nostrils in the morning and at bedtime.     Fluticasone-Umeclidin-Vilant (TRELEGY ELLIPTA) 100-62.5-25  MCG/ACT AEPB Inhale 1 puff into the lungs daily. 60 each 0   Fluticasone-Umeclidin-Vilant (TRELEGY ELLIPTA) 100-62.5-25 MCG/ACT AEPB Inhale 1 puff into the lungs daily. 60 each 0   Fluticasone-Umeclidin-Vilant (TRELEGY ELLIPTA) 200-62.5-25 MCG/INH AEPB Inhale 1 puff into the lungs daily. 60 each 11   gabapentin (NEURONTIN) 100 MG capsule Take 200-300 mg by mouth See admin instructions. Take 200mg  by mouth every day in the morning and take 300mg  by mouth at bedtime     lamoTRIgine (LAMICTAL) 100 MG tablet Take 1 tablet (100 mg total) by mouth 2 (two) times daily. 180 tablet 2   LORazepam (ATIVAN) 1 MG tablet Take 1 tablet (1 mg total) by mouth at bedtime. 30 tablet 2   losartan (COZAAR) 25 MG tablet Take 25 mg by mouth daily.     metroNIDAZOLE (METROGEL) 1 % gel Apply 1 application topically daily as needed.     montelukast (SINGULAIR) 10 MG tablet TAKE ONE TABLET BY MOUTH EVERYDAY AT BEDTIME 30 tablet 5   Multiple Vitamins-Minerals (MULTIVITAMIN WITH MINERALS) tablet Take 1 tablet daily by mouth.     Omega-3 Fatty Acids (FISH OIL) 1000 MG CPDR Take 1 capsule by mouth daily.     OVER THE COUNTER MEDICATION Take 1 tablet by mouth daily at 8 pm. Calcium, Magnesium, and Zinc once per day.     OVER THE COUNTER MEDICATION Take 1 tablet by mouth daily. Garlic Extract 6389 mg once per day.     OVER THE COUNTER MEDICATION Take 1 tablet by mouth daily. Vitamin D3 25 mcg (1000 IU) once per day.     pantoprazole (PROTONIX) 40 MG tablet Take 40 mg by mouth 2 (two) times daily.     rosuvastatin (CRESTOR) 10 MG tablet Take 10 mg by mouth daily.     Turmeric 450 MG CAPS Take 450 mg by mouth daily.     venlafaxine XR (EFFEXOR XR) 75 MG 24 hr capsule Take 3 capsules (225 mg total) by mouth daily with breakfast. 270 capsule 2   vitamin E 400 UNIT capsule Take 400 Units daily by mouth.     No current facility-administered medications for this visit.     Musculoskeletal: Strength & Muscle Tone: na Gait &  Station: na Patient leans: N/A  Psychiatric Specialty Exam: Review of Systems  Musculoskeletal:  Positive for arthralgias and myalgias.  Psychiatric/Behavioral:  The patient is nervous/anxious.   All other systems reviewed and are negative.  There were no vitals taken for this visit.There is no height or weight on file to calculate BMI.  General Appearance: NA  Eye Contact:  NA  Speech:  Clear and Coherent  Volume:  Normal  Mood:  Anxious  Affect:  NA  Thought Process:  Disorganized  Orientation:  Full (Time, Place, and Person)  Thought Content: Tangential   Suicidal Thoughts:  No  Homicidal Thoughts:  No  Memory:  Immediate;   Fair Recent;   Fair Remote;   NA  Judgement:  Good  Insight:  Fair  Psychomotor Activity:  Normal  Concentration:  Concentration: Fair and Attention Span: Fair  Recall:  [employer] of Knowledge: Good  Language: Good  Akathisia:  No  Handed:  Right  AIMS (if indicated): not done  Assets:  Communication Skills Desire for Improvement Resilience Social Support  ADL's:  Intact  Cognition:  WNL  Sleep:  Good   Screenings: AUDIT    Flowsheet Row Admission (Discharged) from 03/18/2014 in [facility]  Alcohol Use Disorder Identification Test Final Score (AUDIT) 0      GAD-7    Flowsheet Row Counselor from 10/21/2020 in [place] Counselor from 06/20/2018 in [place] [facility]  Total GAD-7 Score 10 16      PHQ2-9    Flowsheet Row Video Visit from 09/29/2021 in [place] Counselor from 08/09/2021 in [place] Video Visit from 12/28/2020 in [place] [facility] from 10/21/2020 in [place] [facility] from 06/20/2018 in [place]  [facility]  PHQ-2 Total Score 2 4 1 3 3   PHQ-9 Total Score 6 19 -- 15 14      Flowsheet Row Video Visit from 09/29/2021 in [place] [facility] from 09/09/2021 in [facility] [place] from 08/09/2021 in [place] Error: Q3, 4, or 5 should not be populated when Q2 is No No Risk Error: Question 6 not populated        Assessment and Plan: This patient is a 71 year old female with a history of depression anxiety possible bipolar disorder and short-term memory loss.  She seems to be having more difficulties with word finding and she also seems more tangential.  Her testing has been completed but no result in the record and I will check on this.  For now she will continue Effexor XR 250 mg daily for depression, Lamictal 100 mg twice daily for mood stabilization and Ativan 1 mg at bedtime to help with anxiety as needed.  She will return to see me in 3 months.   Levonne Spiller, MD 09/29/2021, 11:01 AM

## 2021-10-06 ENCOUNTER — Ambulatory Visit (HOSPITAL_COMMUNITY): Payer: Medicare Other | Admitting: Psychiatry

## 2021-10-12 ENCOUNTER — Other Ambulatory Visit: Payer: Self-pay

## 2021-10-12 ENCOUNTER — Encounter: Payer: Medicare Other | Attending: Psychology | Admitting: Psychology

## 2021-10-12 DIAGNOSIS — F419 Anxiety disorder, unspecified: Secondary | ICD-10-CM | POA: Diagnosis present

## 2021-10-12 DIAGNOSIS — R413 Other amnesia: Secondary | ICD-10-CM | POA: Insufficient documentation

## 2021-10-12 DIAGNOSIS — F5105 Insomnia due to other mental disorder: Secondary | ICD-10-CM | POA: Insufficient documentation

## 2021-10-12 DIAGNOSIS — F331 Major depressive disorder, recurrent, moderate: Secondary | ICD-10-CM | POA: Diagnosis present

## 2021-10-12 NOTE — Progress Notes (Signed)
Neuropsychological Evaluation   Patient:  Lauren [name]   DOB: 1951-02-20  MR Number: [id number]  Location: [facility] [place], [place] [id number] [place] [address] Dept: [phone]  Start: 1 PM End: 2 PM  Provider/Observer:     Lauren Roys PsyD  Chief Complaint:      Chief Complaint  Patient presents with   Memory Loss   Anxiety   Depression   Sleeping Problem    Reason For Service:      Lauren [name] is a 71 year old female referred by Lauren Spiller, MD, who is her treating psychiatrist, for neuropsychological evaluation due to reports of persistent and worsening memory loss and other cognitive changes.  The patient is well-known to me as I had seen her for many years in the outpatient behavioral health clinic in [place] for therapeutic interventions due to significant anxiety, depression and fibromyalgia type symptoms.  The patient has had some very significant recent stressors with the death of her husband, where he was experiencing significant medical complications including kidney cancer, broken hips and multiple infections.  He was hospitalized for extended periods of time and was essentially bedridden for some time at the end of his life.  The patient was the sole primary caregiver throughout this time.  The patient has concerns that she may have been having some TIAs or other vascular events and is simply worried about her experiencing more memory problems and word finding and other expressive language changes.  The patient reports that her memory issues are "scary" to her and she is worried about a progressive nature.  The patient also describes expressive language changes including paraphasic errors, word substitutions and disruptions in grammatical order.  The patient reports that she will search for words and cannot find the word that she wants to  say or be able to pronounce the word correctly.  The patient denies any significant geographic disorientation, visual hallucinations or any tremors.  While the patient reports there is no known specific stroke and she has not had an MRI at this point and also reports that the symptoms do not appear to have had a sudden onset but she still is concerned about possible vascular issues.  The patient reports that there have been times where she is experienced some muscle drawing or drooping on the side of her face and weakness in parts of her body but she is not sure as she has a long history of medical issues.  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  The patient describes her memory difficulties and is having trouble with memory, especially short-term memory loss.  The patient reports that she has not been able to do things that she needs to do like remember important dates and appointments and gets mixed up in her plans.  She reports troubles finding her words and that she will be talking to people and call them by other people's names and in certain words into a sentence.  The patient reports that she has been very concerned.  The patient reports that this has been ongoing for some time but she was only able to recently start focusing on her own health and wellness as she was the sole caretaker for her  husband who had serious long-term medical illness where she was the primary caretaker.  Her husband has now passed away and she has been putting more effort into taking care of her self.   The patient has a past medical history that includes longstanding severe issues with anxiety and depressive symptomatology, insomnia, hepatitis C that has been treated, irritable bowel syndrome, fibromyalgia, GERD, chronic obstructive pulmonary disease, and  dysphagia.  Tests Administered: Controlled Oral Word Association Test (COWAT; FAS & Animals)  Wechsler Adult Intelligence Scale, 4th Edition (WAIS-IV) Wechsler Memory Scale, 4th Edition (WMS-IV) Older Adult Battery  Participation Level:   Active  Participation Quality:  Redirectable      Behavioral Observation:  The patient arrived early for her appointment and appeared well-groomed and appropriately dressed. Her manners were polite and appropriate to the situation. The patient reported that she was extremely anxious about the test and became very upset before beginning. The patient had some confusion about her medication and worried that taking the medication may impact her performance. She became emotional several times throughout the test and was often tearful.  Well Groomed, Alert, and Tearful.   Test Results:   Initially, an estimation was made as to premorbid intellectual and cognitive functioning.  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  COWAT FAS Total = 34 Z = -0.66 Animals Total = 16 Z = -0.52   As the patient has described some expressive language difficulties including word finding difficulties and naming difficulties the patient was administered to control oral Word Association test.  On the measure of lexical fluency (FAS test) the patient generally performed in the average range and was less than 1 standard deviation below normative expectations.  Given the patient's emotional standpoint and tearfulness at the beginning of the testing session should be cautioned to over interpret this level  although the patient is describing difficulties and expressive language fluency.  The patient also completed the animal naming test which assesses somatic fluency and she also performed about 0.5 standard deviations below normative comparisons based on education, age and gender norms.  Again, the patient had difficulty adjusting to the testing situation and was quite anxious and fearful that she would perform poorly.     WAIS-IV               Composite Score Summary        Scale Sum of Scaled Scores Composite Score Percentile Rank 95% Conf. Interval Qualitative Description  Verbal Comprehension 31 VCI 102 55 96-108 Average  Perceptual Reasoning 28 PRI 96 39 90-102 Average  Working Memory 13 WMI 80 9 74-88 Low Average  Processing Speed 20 PSI 100 50 92-108 Average  Full Scale 92 FSIQ 94 34 90-98 Average  General Ability 59 GAI 99 47 94-104 Average      The patient was then administered the Wechsler Adult Intelligence Scale-IV.  As the patient is describing subjective reports of memory and cognitive difficulties her current performance should not be used as a description of her premorbid intellectual and cognitive functioning but rather a description of her current status.  The patient produced a full-scale IQ score of 94 which  falls in the 34th percentile and is in the average range.  This is approximately 1 standard deviation below predicted levels and does suggest at least 1 area of difficulties relative to premorbid status.  We also calculated the patient's general abilities index score as it places less emphasis on measures that are most sensitive to acute change including auditory encoding variables and information processing speed variables.  The patient produced a general abilities index score of 99 which falls at the 47th percentile and is only slightly below predicted levels.           Verbal Comprehension Subtests Summary     Subtest Raw Score Scaled Score Percentile Rank  Reference Group Scaled Score SEM  Similarities 23 10 50 9 0.95  Vocabulary 41 11 63 12 0.67  Information 14 10 50 11 0.73  (Comprehension) 24 11 63 10 1.27    The patient produced a verbal comprehension index score of 102 which follows the 55th percentile and is in the average range.  This is only slightly below predicted levels of premorbid functioning.  There is little to no variability with an subtest performance and the patient performed in the average range with regard to verbal reasoning and problem-solving, her vocabulary knowledge, her general fund of information and social judgment comprehension measures.               Perceptual Reasoning Subtests Summary     Subtest Raw Score Scaled Score Percentile Rank Reference Group Scaled Score SEM  Block Design 20 7 16 5  0.99  Matrix Reasoning 15 12 75 8 0.90  Visual Puzzles 10 9 37 7 0.99  (Picture Completion) 9 9 37 7 0.99      The patient produced a perceptual reasoning index score of 96 which falls at the 39th percentile and is in the average range.  Again, this is slightly below predicted levels and may be around 1 standard deviation below predicted levels.  There was considerable variability in subtest performance.  The patient had greatest difficulties on measures of visual analysis and organizational abilities are performed in the average and high average range with regard to visual reasoning and problem-solving, visual estimation and judgment and her ability to identify visual anomalies within the visual gestalt.  There were no consistent findings of any visual spatial deficits beyond 1 subtest that had a great deal of time-based elements to it.             Working Electrical engineer Raw Score Scaled Score Percentile Rank Reference Group Scaled Score SEM  Digit Span 17 6 9 4  0.73  Arithmetic 9 7 16 6  1.20      The patient produced a working memory index score of 80 which falls at the 9th percentile and is  significantly below predicted levels of premorbid functioning.  These measures of auditory encoding showed little to no subtest variability and she consistently performed in the mildly impaired range with regard to auditory encoding and processing of active auditory cues.  These measures can be particularly impacted by anxiety and emotional distress, both of which were present during the evaluation.             Processing Speed Subtests Summary    Subtest Raw Score Scaled Score Percentile Rank Reference Group Scaled Score SEM  Symbol Search 29 12 75 8 1.12  Coding 37 8 25 4  1.12      The patient produced a processing speed  index score of 100 which falls at the 50th percentile and is generally consistent with premorbid estimations.  There was some variability with an subtest performance where she produced performances in the high average range on 1 measure of visual scanning and visual searching/information processing speed and at the lower end of the average range on another.  Putting these measures together there does not appear to be any significant change in overall information processing speed type elements.       WMS-IV           Index Score Summary       Index Sum of Scaled Scores Index Score Percentile Rank 95% Confidence Interval Qualitative Descriptor  Auditory Memory (AMI) 41 101 53 95-107 Average  Visual Memory (VMI) 18 95 37 90-100 Average  Immediate Memory (IMI) 26 91 27 85-98 Average  Delayed Memory (DMI) 33 106 66 98-113 Average    The patient was then administered the Wechsler Memory Scale-IV to provide a thorough and structured assessment of a wide range of memory components.  On the Wechsler Adult Intelligence Scale, the patient performed in the mild to moderate level of impairment with regard to auditory encoding measures.  The patient showed a little bit better but similar performance on measures of visual encoding.  These levels of performance would suggest that both  auditory and visual encoding was having some mild to moderate involvement in this level of difficulties would have an effect on her efficiency with storage and organization of usually experienced auditory or visual information.  Breaking memory components down between auditory versus visual memory the patient produced an auditory memory index score of 101 which falls at the 53rd percentile and is in the average range.  This is consistent with predicted levels and only slightly below her suspected premorbid functioning.  The patient produced a similar performance on visual based memory components where she produced a visual memory index score of 95 which fell at the 32nd percentile.  Again, this is slightly below predicted premorbid levels of functioning but does not show any indication of significant memory deficits and is consistent with predicted levels in an individual with roughly adequate functioning memory with the level of mild to moderate deficits for encoding variables.  Breaking memory components down between immediate versus delayed, the patient produced an immediate memory index score of 91 which fell at the 27th percentile and is in the average range.  This suggest some difficulties with regard to initially storing and organizing new information.  However, on delayed memory components the patient produced a delayed memory index score of 106 which falls at the 66 percentile.  This pattern strongly suggest that while she is having difficulty with encoding and initially storing and organizing new information the information that is effectively initially encoded and stored remains quite stable and is available for later recall.  There does not appear to be any progressive or significant loss of information once it is able to be effectively stored in short-term memory stores.  The patient also did rather well on recognition and cueing components as well further suggesting efficient l maintenance of  information once it is initially encoded and stored.  The patient improved performance on recognition and cued recall which tend to be less impacted by anxiety and stress as there is an external cue for these measures.            Primary Subtest Scaled Score Summary     Subtest Domain Raw Score Scaled Score  Percentile Rank  Logical Memory I AM 20 6 9   Logical Memory II AM 14 9 37  Verbal [facility] [facility] I AM 25 12 75  Verbal [facility] II AM 9 14 91  Visual Reproduction I VM 26 8 25   Visual Reproduction II VM 17 10 50  Symbol Span VWM 12 8 25                 Auditory Memory Process Score Summary      Process Score Raw Score Scaled Score Percentile Rank Cumulative Percentage (Base Rate)  LM II Recognition 16 - - 17-25%  VPA II Recognition 29 - - >75%                   Visual Memory Process Score Summary      Process Score Raw Score Scaled Score Percentile Rank Cumulative Percentage (Base Rate)  VR II Recognition 5 - - 51-75%          ABILITY-MEMORY ANALYSIS   Ability Score:    VCI: 102 Date of Testing:           WAIS-IV; WMS-IV 2021/06/24             Predicted Difference Method    Index Predicted WMS-IV Index Score Actual WMS-IV Index Score Difference Critical Value   Significant Difference Y/N Base Rate  Auditory Memory 101 101 0 10.41 N    Visual Memory 101 95 6 7.35 N    Immediate Memory 101 91 10 9.69 Y 20-25%  Delayed Memory 101 106 -5 12.22 N    Statistical significance (critical value) at the .01 level.      We also calculated the patient's abilities memory analysis.  A prediction score is made utilizing traditionally stable measures from the Western adult intelligence scale (V IQ) and this verbal comprehension index score is used to produce a predicted level of performance on various memory and learning components.  This predicted scores and compared against actual achieved measures.  On all predicted versus achieved measures the patient  performed consistent with predicted levels with the exception of her immediate memory index.  In fact, the patient did better than predicted levels on delayed memory components.  This is consistent with patterns discussed earlier and would strongly suggest that the patient is able to store and retain information over a period of delay effectively but her primary memory difficulties have impacts primarily on impairments with regard to auditory and visual encoding and to a lesser extent being able to transfer those initially encoded information's initially into short-term memory stores.  The patient was able to retain information after period of delay and showed significant improvement in memory functions under cueing and recognition formats.  Summary of Results:   Overall, the results of the current objective neuropsychological test performance is do suggest that overall the patient is doing well and the vast majority of her difficulties could be explained by functional elements including significant anxiety and depressive symptomatology.  The patient's most consistent difficulties had to do with impairments for both auditory and visual encoding capacity and other measures of attention and concentration.  The patient was very distressed and emotional least initially during the testing.  The patient showed some mild weaknesses relative to verbal fluency elements but nothing consistent or substantial in these areas with significant subjective reports of difficulties.  The patient's best performance was where on measures of information processing speed and verbal comprehension and social judgment components as well as the  patient's performance on delayed memory components both visually and auditorily.  Significant improvement under cued recall formats were noted.  Specific weaknesses were primarily related to both auditory and encoding deficits and difficulties initially learning information due to these encoding  weaknesses.  Cued recall and delayed recall were all quite efficient and there was no consistent pattern of memory deficits outside of those explained by attentional impairments and encoding deficits.  Impression/Diagnosis:   Overall, the results of the current neuropsychological evaluation are very encouraging.  As I have had the benefit of knowing the patient for more than 20 years now I have a baseline of observations for her over this time span.  The patient has a history of significant anxiety and depressive events and has gone through a number of significant psychosocial stressors more recently with the significant medical illness her husband went through and her being the primary caregiver and then his more recent death.  The patient acknowledges some coping difficulties with these experiences as expected.  The patient has been effectively treated many years ago for her hepatitis B and has also been free of any substance abuse or alcohol abuse for several decades now.  The patient does pay some attention to having a good diet nutrition and tries to be active when she can.  She does have some significant pain difficulties.  Of the biggest note is her longstanding history of significant anxiety and depressive symptomatology.  The patient was very emotional especially at the beginning of the testing visit and concerned about doing poorly and what that would mean.  Almost all of the areas of weakness on her neuropsychological testing could be explained by her longstanding depression and anxiety, acute emotional status and also longstanding difficulties with sleep onset and maintenance.  As far as diagnostic considerations, there really is no indication of any type of pattern consistent with progressive cortical or subcortical dementia.  The patient complains of cognitive difficulties going over quite some time and the patient generally performed only slightly below predicted levels based on her education  occupational history.  I do think that the patient's subjective symptoms and experiences are valid and she is having times with memory difficulties and attention and concentration difficulties but these are most likely explained by sleep disturbance and psychiatric issues producing functional impacts on her cognitive functioning.  Again, I am very encouraged by her performance on a wide range of cognitive variables and the fact that one of her best areas of functioning had to do with her delayed memory capacity and significant improvement and efficient performance under recognition and cued recall formats.  I will sit down with the patient and go over the results of the current neuropsychological evaluation and talk about strategies going forward.  The patient is continuing with her behavioral health care including psychiatric care with Dr. Harrington Challenger and counseling with Maurice Small, MSW through the [facility] behavioral health clinic.  I would highly encourage the patient to continue this care as she is looking to more effectively focus on her wellbeing going forward which I fully support.  Diagnosis:    Memory loss  Moderate episode of recurrent major depressive disorder ([place])  Insomnia due to mental disorder   _____________________ Ilean Skill, Psy.D. Clinical Neuropsychologist

## 2021-10-14 ENCOUNTER — Ambulatory Visit: Payer: Medicare Other | Admitting: Psychology

## 2021-10-18 ENCOUNTER — Ambulatory Visit: Payer: Medicare Other | Admitting: Psychology

## 2021-10-20 ENCOUNTER — Other Ambulatory Visit: Payer: Self-pay

## 2021-10-20 ENCOUNTER — Ambulatory Visit (INDEPENDENT_AMBULATORY_CARE_PROVIDER_SITE_OTHER): Payer: Medicare Other | Admitting: Psychiatry

## 2021-10-20 DIAGNOSIS — F332 Major depressive disorder, recurrent severe without psychotic features: Secondary | ICD-10-CM | POA: Diagnosis not present

## 2021-10-20 NOTE — Progress Notes (Signed)
Virtual Visit via Telephone Note  I connected with Lauren [name] on 10/20/21 at 1:11 PM EST  by telephone and verified that I am speaking with the correct person using two identifiers.  Location: Patient: Home Provider: [place] office    I discussed the limitations, risks, security and privacy concerns of performing an evaluation and management service by telephone and the availability of in person appointments. I also discussed with the patient that there may be a patient responsible charge related to this service. The patient expressed understanding and agreed to proceed.    I provided 44 minutes of non-face-to-face time during this encounter.   [name], LCSW            THERAPIST PROGRESS    Session Time:  Wednesday  10/19/2021 1:10 PM - 1:54 PM   Participation Level: Active  Behavioral Response: CasualAlert/ less depressed, less anxious  Type of Therapy: Individual Therapy  Treatment Goals addressed: Patient states wanting to be more positive, complete tasks, and improve organizational skills in adjusting to life without her husband/appropriately grieve loss and return to  previous adaptive level of functioning  Interventions: CBT and Supportive  Summary: Lauren [name] is a 71 y.o. female who is referred for services due to stress, anxiety, and depression.  She has participated in therapy intermittently for the past 25 years.  She reports 3 psychiatric hospitalizations with the last 1 occurring over 5 years ago at [facility].  Patient continues to deal with grief and loss issues regarding the death of her husband last year.  She also reports ruminations, difficulty completing tasks at home, depressed mood, and irritability.  She reports staying at home more as she has memory difficulty and is embarrassed about this.  She also reports fears that she may have to go to a nursing home should she have dementia.  She is scheduled for a  follow-up appointment with psychologist regarding recent cognitive testing.         Patient last was seen via virtual visit about 4 weeks ago. She reports improved mood and increased behavioral activation as well as socialization since last session.  She has gone out to dinner with friends twice, watched the Super Bowl with friends, attended a women's meeting, and become more involved in taking care of household responsibilities including selecting paint for her home.  She reports some stress about trying to make a decision regarding deciding what to do about her husband's vehicles.  She reports reviewing handouts on cognitive model and completing the cognitive model practice exercises.  Patient reports this has been very helpful in helping her to become more aware of her thoughts.  She cites 2 recent examples where she was more aware of her thought patterns and successfully challenged/and replaced irrational thought patterns with more rational thought patterns.  Patient reports feeling much better about the situation once doing this.  She continues to experience concern about her memory issues but is scheduled to meet with psychologist Dr. Sima Matas to discuss the results of testing.    Suicidal/Homicidal: Nowithout intent/plan    Therapist Response: reviewed symptoms, praised and reinforced patient's increased behavioral activation/socialization, discussed effects, praised and reinforced patient's completion of homework, processed homework with patient, praised and reinforced patient's increased awareness of thoughts and efforts to identify/challenge/and replace irrational thoughts, discussed effects, provided psychoeducation on cognitive distortions, began to assist patient identify her most prevalent distortions, discussed rationale for/introduced/and provided instructions on how to complete thought log, developed  plan with patient to complete thought log once daily and bring to next session, will send  thought logs via mail Plan: Return again in 2 weeks.  Diagnosis: Axis I: MDD, Recurrent, GAD

## 2021-10-21 ENCOUNTER — Other Ambulatory Visit: Payer: Self-pay

## 2021-10-21 ENCOUNTER — Ambulatory Visit: Payer: Medicare Other | Admitting: Psychology

## 2021-10-21 ENCOUNTER — Encounter (HOSPITAL_BASED_OUTPATIENT_CLINIC_OR_DEPARTMENT_OTHER): Payer: Medicare Other | Admitting: Psychology

## 2021-10-21 DIAGNOSIS — F5105 Insomnia due to other mental disorder: Secondary | ICD-10-CM | POA: Diagnosis not present

## 2021-10-21 DIAGNOSIS — F419 Anxiety disorder, unspecified: Secondary | ICD-10-CM

## 2021-10-21 DIAGNOSIS — R413 Other amnesia: Secondary | ICD-10-CM | POA: Diagnosis not present

## 2021-10-21 DIAGNOSIS — F331 Major depressive disorder, recurrent, moderate: Secondary | ICD-10-CM

## 2021-10-28 NOTE — Progress Notes (Signed)
10/21/2020 2 PM-3 PM:  Today's visit was an in person visit with the patient myself present.  Today we reviewed the results of the recent formal neuropsychological evaluation and discussed the relationship between the patient's subjective symptoms, prior medical history and current status as well as objective neuropsychological assessment.  The patient was quite open and we had a very frank and fruitful discussion today reviewing the results.  I have included a copy of the summary and results below for convenience and the formal neuropsychological evaluation can be found in this entirety dated 10/12/2021.    Summary of Results:                        Overall, the results of the current objective neuropsychological test performance is do suggest that overall the patient is doing well and the vast majority of her difficulties could be explained by functional elements including significant anxiety and depressive symptomatology.  The patient's most consistent difficulties had to do with impairments for both auditory and visual encoding capacity and other measures of attention and concentration.  The patient was very distressed and emotional least initially during the testing.  The patient showed some mild weaknesses relative to verbal fluency elements but nothing consistent or substantial in these areas with significant subjective reports of difficulties.  The patient's best performance was where on measures of information processing speed and verbal comprehension and social judgment components as well as the patient's performance on delayed memory components both visually and auditorily.  Significant improvement under cued recall formats were noted.  Specific weaknesses were primarily related to both auditory and encoding deficits and difficulties initially learning information due to these encoding weaknesses.  Cued recall and delayed recall were all quite efficient and there was no consistent pattern of memory  deficits outside of those explained by attentional impairments and encoding deficits.   Impression/Diagnosis:                     Overall, the results of the current neuropsychological evaluation are very encouraging.  As I have had the benefit of knowing the patient for more than 20 years now I have a baseline of observations for her over this time span.  The patient has a history of significant anxiety and depressive events and has gone through a number of significant psychosocial stressors more recently with the significant medical illness her husband went through and her being the primary caregiver and then his more recent death.  The patient acknowledges some coping difficulties with these experiences as expected.  The patient has been effectively treated many years ago for her hepatitis B and has also been free of any substance abuse or alcohol abuse for several decades now.  The patient does pay some attention to having a good diet nutrition and tries to be active when she can.  She does have some significant pain difficulties.  Of the biggest note is her longstanding history of significant anxiety and depressive symptomatology.  The patient was very emotional especially at the beginning of the testing visit and concerned about doing poorly and what that would mean.  Almost all of the areas of weakness on her neuropsychological testing could be explained by her longstanding depression and anxiety, acute emotional status and also longstanding difficulties with sleep onset and maintenance.  As far as diagnostic considerations, there really is no indication of any type of pattern consistent with progressive cortical or subcortical dementia.  The patient  complains of cognitive difficulties going over quite some time and the patient generally performed only slightly below predicted levels based on her education occupational history.  I do think that the patient's subjective symptoms and experiences are valid  and she is having times with memory difficulties and attention and concentration difficulties but these are most likely explained by sleep disturbance and psychiatric issues producing functional impacts on her cognitive functioning.  Again, I am very encouraged by her performance on a wide range of cognitive variables and the fact that one of her best areas of functioning had to do with her delayed memory capacity and significant improvement and efficient performance under recognition and cued recall formats.  I will sit down with the patient and go over the results of the current neuropsychological evaluation and talk about strategies going forward.  The patient is continuing with her behavioral health care including psychiatric care with Dr. Harrington Challenger and counseling with Maurice Small, MSW through the [facility].  I would highly encourage the patient to continue this care as she is looking to more effectively focus on her wellbeing going forward which I fully support.   Diagnosis:                                Memory loss   Moderate episode of recurrent major depressive disorder ([place])   Insomnia due to mental disorder     _____________________ Ilean Skill, Psy.D. Clinical Neuropsychologist

## 2021-11-03 ENCOUNTER — Other Ambulatory Visit: Payer: Self-pay

## 2021-11-03 ENCOUNTER — Ambulatory Visit (INDEPENDENT_AMBULATORY_CARE_PROVIDER_SITE_OTHER): Payer: Medicare Other | Admitting: Psychiatry

## 2021-11-03 DIAGNOSIS — F411 Generalized anxiety disorder: Secondary | ICD-10-CM | POA: Diagnosis not present

## 2021-11-03 DIAGNOSIS — F332 Major depressive disorder, recurrent severe without psychotic features: Secondary | ICD-10-CM

## 2021-11-03 NOTE — Progress Notes (Signed)
Virtual Visit via Telephone Note ? ?I connected with Lauren [name] on 11/03/21 at 1:08 PM EST  by telephone and verified that I am speaking with the correct person using two identifiers. ? ?Location: ?Patient: Home ?Provider: [facility] [place] office  ?  ?I discussed the limitations, risks, security and privacy concerns of performing an evaluation and management service by telephone and the availability of in person appointments. I also discussed with the patient that there may be a patient responsible charge related to this service. The patient expressed understanding and agreed to proceed. ? ? ?I provided 48 minutes of non-face-to-face time during this encounter. ? ? ?[place], LCSW ? ? ? ? ? ? ? ?   THERAPIST PROGRESS  ? ? ?Session Time:  Wednesday  11/03/2021 1:08 PM -1:56  PM  ? ?Participation Level: Active ? ?Behavioral Response: CasualAlert/depressed anxious ? ?Type of Therapy: Individual Therapy ? ?Treatment Goals addressed: Patient will score less than 10 on the patient health questionnaire, patient will identify 3 cognitive patterns and beliefs that support depression ? ?Progress on Goals: Progressing ? ?Interventions: CBT and Supportive ? ?Summary: Lauren [name] is a 71 y.o. female who is referred for services due to stress, anxiety, and depression.  She has participated in therapy intermittently for the past 25 years.  She reports 3 psychiatric hospitalizations with the last 1 occurring over 5 years ago at [facility].  Patient continues to deal with grief and loss issues regarding the death of her husband last year.  She also reports ruminations, difficulty completing tasks at home, depressed mood, and irritability.  She reports staying at home more as she has memory difficulty and is embarrassed about this.  She also reports fears that she may have to go to a nursing home should she have dementia.  She is scheduled for a follow-up appointment with psychologist regarding  recent cognitive testing.   ? ?     ?Patient last was seen via virtual visit about 2 weeks ago. She reports increased depressed mood and worry since last session.  Per patient's report, this was triggered by learning the results of testing with Dr. Sima Matas.  Patient is pleased there is no indication of Alzheimer's or dementia but expresses concerns and negative thoughts about how she performed on the test.  She reports this sent her on a downward spiral.  She reports using thought log to try to come up with counter statement but had difficulty doing this.  She reports she has tried to maintain involved in activities.  She recently went to a friend's house for dinner.  She also recently enjoyed attending [employer] services at church.   ? ?Suicidal/Homicidal: Nowithout intent/plan ?   ?Therapist Response: reviewed symptoms, praised and reinforced patient's efforts to maintain involvement in activity, praised and reinforced patient's efforts to use thought log, processed thought log with patient, assisted patient identify/challenge forward/and replace negative thought with the use of Socratic questioning, developed plan with patient to continue using thought log, will send patient handouts, also developed plan with patient to use Socratic questioning handout to challenge her thoughts, will send patient handout  ? ?Diagnosis: Axis I: MDD, Recurrent, GAD ? ?Collaboration of Care: Other none needed at this session ? ?Patient/Guardian was advised Release of Information must be obtained prior to any record release in order to collaborate their care with an outside provider. Patient/Guardian was advised if they have not already done so to contact the registration department to sign  all necessary forms in order for [place] to release information regarding their care.  ? ?Consent: Patient/Guardian gives verbal consent for treatment and assignment of benefits for services provided during this visit. Patient/Guardian expressed  understanding and agreed to proceed.  ?

## 2021-11-08 ENCOUNTER — Other Ambulatory Visit (HOSPITAL_COMMUNITY): Payer: Self-pay | Admitting: Family Medicine

## 2021-11-08 ENCOUNTER — Telehealth: Payer: Self-pay | Admitting: Pulmonary Disease

## 2021-11-08 DIAGNOSIS — Z1231 Encounter for screening mammogram for malignant neoplasm of breast: Secondary | ICD-10-CM

## 2021-11-09 MED ORDER — TRELEGY ELLIPTA 100-62.5-25 MCG/ACT IN AEPB: 1.0000 | INHALATION_SPRAY | Freq: Every day | RESPIRATORY_TRACT | 0 refills | Status: DC

## 2021-11-09 NOTE — Telephone Encounter (Signed)
Called and spoke with patient. She was calling to see if we had any samples of Trelegy. She confirmed that she is a [facility] patient. She is aware that I will send a message to [facility] to see if they have any samples in [place]. She expressed appreciation. ? ?[facility], do you have samples of Trelegy in [place]? Thanks!  ?

## 2021-11-09 NOTE — Telephone Encounter (Signed)
Called and spoke to patient. Let her know we have one more Trelegy 100 sample left. Patient is coming to office to pick up sample. She states the inhaler is costing her $100 a month. I told her I would leave [place] assistance papers at the front for her for when she comes to get sample.  ?

## 2021-11-12 DIAGNOSIS — M797 Fibromyalgia: Secondary | ICD-10-CM | POA: Diagnosis not present

## 2021-11-12 DIAGNOSIS — I10 Essential (primary) hypertension: Secondary | ICD-10-CM | POA: Diagnosis not present

## 2021-11-12 DIAGNOSIS — E7800 Pure hypercholesterolemia, unspecified: Secondary | ICD-10-CM | POA: Diagnosis not present

## 2021-11-12 DIAGNOSIS — E1169 Type 2 diabetes mellitus with other specified complication: Secondary | ICD-10-CM | POA: Diagnosis not present

## 2021-11-12 DIAGNOSIS — E782 Mixed hyperlipidemia: Secondary | ICD-10-CM | POA: Diagnosis not present

## 2021-11-17 ENCOUNTER — Ambulatory Visit (INDEPENDENT_AMBULATORY_CARE_PROVIDER_SITE_OTHER): Payer: Medicare Other | Admitting: Psychiatry

## 2021-11-17 ENCOUNTER — Other Ambulatory Visit: Payer: Self-pay

## 2021-11-17 DIAGNOSIS — F332 Major depressive disorder, recurrent severe without psychotic features: Secondary | ICD-10-CM | POA: Diagnosis not present

## 2021-11-17 NOTE — Progress Notes (Signed)
Virtual Visit via Video Note ? ?I connected with [name] on 11/17/21 at 1:08 PM EDT by a video enabled telemedicine application and verified that I am speaking with the correct person using two identifiers. ? ?Location: ?Patient: Home ?Provider: [facility] [place] office  ?  ?I discussed the limitations of evaluation and management by telemedicine and the availability of in person appointments. The patient expressed understanding and agreed to proceed. ? ?I provided 37 minutes of non-face-to-face time during this encounter. ? ? ?[place], LCSW ? ? ? ? ? ? ? ?   THERAPIST PROGRESS  ? ? ?Session Time:  Wednesday  11/17/2021 1:08 PM - 1:45 PM  ? ?Participation Level: Active ? ?Behavioral Response: CasualAlert/depressed, anxious ? ?Type of Therapy: Individual Therapy ? ?Treatment Goals addressed: Patient will score less than 10 on the patient health questionnaire, patient will identify 3 cognitive patterns and beliefs that support depression ? ?Progress on Goals: Progressing ? ?Interventions: CBT and Supportive ? ?Summary: Lauren [name] is a 71 y.o. female who is referred for services due to stress, anxiety, and depression.  She has participated in therapy intermittently for the past 25 years.  She reports 3 psychiatric hospitalizations with the last 1 occurring over 5 years ago at [facility].  Patient continues to deal with grief and loss issues regarding the death of her husband last year.  She also reports ruminations, difficulty completing tasks at home, depressed mood, and irritability.  She reports staying at home more as she has memory difficulty and is embarrassed about this.  She also reports fears that she may have to go to a nursing home should she have dementia.  She is scheduled for a follow-up appointment with psychologist regarding recent cognitive testing.   ? ?     ?Patient last was seen via virtual visit about 2 weeks ago. She reports experiencing more symptoms of  depression in the past few days triggered by concerns about a friend who is homeless.  Prior to then, patient reports increased involvement in activity and socialization.  She is very pleased she has been cleaning up and purging items from her home.  She reports experiencing conflict with a friend but is very pleased with her use of assertive skills using I messages to express her feelings.  Patient reports also using thought log as well as Socratic questioning has been helpful.  She cites recent examples reports this helped her look at situations more rationally and improve her mood.  She also reports this helped her to become more involved socially and attend a meeting.  Therapist and patient agreed in session early today as patient has a headache.   ?Suicidal/Homicidal: Nowithout intent/plan ?   ?Therapist Response: reviewed symptoms, praised and reinforced patient's increased involvement in activity and socialization, discussed effects, praised and reinforced patient's use of thought log and Socratic questioning, processed log with patient, discussed the effects of using Socratic questioning on her mood and her behavior, developed plan with patient to continue using thought log and Socratic questioning, also discussed neck steps for treatment to include addressing believes/assumptions as well as core beliefs ? ?Diagnosis: Axis I: MDD, Recurrent, GAD ? ?Collaboration of Care: Other none needed at this session ? ?Patient/Guardian was advised Release of Information must be obtained prior to any record release in order to collaborate their care with an outside provider. Patient/Guardian was advised if they have not already done so to contact the registration department to sign all necessary  forms in order for [place] to release information regarding their care.  ? ?Consent: Patient/Guardian gives verbal consent for treatment and assignment of benefits for services provided during this visit. Patient/Guardian expressed  understanding and agreed to proceed.  ?

## 2021-11-18 ENCOUNTER — Ambulatory Visit (HOSPITAL_COMMUNITY)
Admission: RE | Admit: 2021-11-18 | Discharge: 2021-11-18 | Disposition: A | Discharge status: Home / Self Care | Payer: Medicare Other | Source: Ambulatory Visit | Attending: Family Medicine | Admitting: Family Medicine

## 2021-11-18 ENCOUNTER — Other Ambulatory Visit: Payer: Self-pay

## 2021-11-18 DIAGNOSIS — Z1231 Encounter for screening mammogram for malignant neoplasm of breast: Secondary | ICD-10-CM | POA: Diagnosis not present

## 2021-11-22 DIAGNOSIS — L989 Disorder of the skin and subcutaneous tissue, unspecified: Secondary | ICD-10-CM | POA: Diagnosis not present

## 2021-11-22 DIAGNOSIS — D225 Melanocytic nevi of trunk: Secondary | ICD-10-CM | POA: Diagnosis not present

## 2021-11-22 DIAGNOSIS — Z1283 Encounter for screening for malignant neoplasm of skin: Secondary | ICD-10-CM | POA: Diagnosis not present

## 2021-11-22 DIAGNOSIS — D485 Neoplasm of uncertain behavior of skin: Secondary | ICD-10-CM | POA: Diagnosis not present

## 2021-11-22 DIAGNOSIS — L218 Other seborrheic dermatitis: Secondary | ICD-10-CM | POA: Diagnosis not present

## 2021-12-01 ENCOUNTER — Other Ambulatory Visit: Payer: Self-pay

## 2021-12-01 ENCOUNTER — Ambulatory Visit (INDEPENDENT_AMBULATORY_CARE_PROVIDER_SITE_OTHER): Payer: Medicare Other | Admitting: Psychiatry

## 2021-12-01 DIAGNOSIS — F332 Major depressive disorder, recurrent severe without psychotic features: Secondary | ICD-10-CM

## 2021-12-01 NOTE — Progress Notes (Signed)
Virtual Visit via Telephone Note ? ?I connected with Lauren [name] on 12/01/21 at 1:11 PM EDT  by telephone and verified that I am speaking with the correct person using two identifiers. ? ?Location: ?Patient: Home ?Provider: [facility] [place] office  ?  ?I discussed the limitations, risks, security and privacy concerns of performing an evaluation and management service by telephone and the availability of in person appointments. I also discussed with the patient that there may be a patient responsible charge related to this service. The patient expressed understanding and agreed to proceed. ? ? ?I provided 43 minutes of non-face-to-face time during this encounter. ? ? ?[place], LCSW ? ? ? ? ? ? ? ? ?   THERAPIST PROGRESS  ? ? ?Session Time:  Wednesday  12/01/2021 1:11 PM - 1:54 PM  ? ?Participation Level: Active ? ?Behavioral Response: CasualAlert/depressed, anxious ? ?Type of Therapy: Individual Therapy ? ?Treatment Goals addressed: Patient will score less than 10 on the patient health questionnaire, patient will identify 3 cognitive patterns and beliefs that support depression ? ?Progress on Goals: Progressing ? ?Interventions: CBT and Supportive ? ?Summary: Lauren [name] is a 70 y.o. female who is referred for services due to stress, anxiety, and depression.  She has participated in therapy intermittently for the past 25 years.  She reports 3 psychiatric hospitalizations with the last 1 occurring over 5 years ago at [facility].  Patient continues to deal with grief and loss issues regarding the death of her husband last year.  She also reports ruminations, difficulty completing tasks at home, depressed mood, and irritability.  She reports staying at home more as she has memory difficulty and is embarrassed about this.  She also reports fears that she may have to go to a nursing home should she have dementia.  She is scheduled for a follow-up appointment with psychologist  regarding recent cognitive testing.   ? ?     ?Patient last was seen via virtual visit about 2 weeks ago. She reports continued symptoms of depression as reflected in PHQ-9.  She reports increased ruminating negative thoughts about self and feelings of rejection last week.  These were triggered by incident with a friend.  Patient reports initially using behavioral activation to try to manage.  She eventually used thought log and was able to challenge and replace negative thoughts.  However, she expresses frustration with self that she continues to have negative thoughts and has expectations that she should not have these negative thoughts since she has been learning skills/techniques to address.  Patient has maintained involvement in activities such as attending meetings, talking to friends, and doing light household projects.   ? ?Suicidal/Homicidal: Nowithout intent/plan ?   ?Therapist Response: reviewed symptoms, administered PHQ-9, discussed stressors, facilitate expression of thoughts and feelings, validated feelings, praised and reinforced patient's use of behavioral activation and efforts to use thought log, discussed effects, praised and reinforced patient's increased awareness of her thoughts, assisted patient identify/challenge/and replace irrational thoughts regarding expectations of self regarding having negative/irrational thoughts, assisted patient identify realistic expectations of self, develop plan with patient to continue using thought log and Socratic questioning, will send patient handout on conceptualizing dysfunctional thoughts in preparation for next session, also will send patient handouts (thought log, Socratic questioning) ? ?Diagnosis: Axis I: MDD, Recurrent, GAD ? ?Collaboration of Care: Other none needed at this session ? ?Patient/Guardian was advised Release of Information must be obtained prior to any record release in order to  collaborate their care with an outside provider.  Patient/Guardian was advised if they have not already done so to contact the registration department to sign all necessary forms in order for [place] to release information regarding their care.  ? ?Consent: Patient/Guardian gives verbal consent for treatment and assignment of benefits for services provided during this visit. Patient/Guardian expressed understanding and agreed to proceed.  ?

## 2021-12-02 ENCOUNTER — Ambulatory Visit: Payer: Medicare Other | Admitting: Psychology

## 2021-12-03 ENCOUNTER — Other Ambulatory Visit: Payer: Self-pay | Admitting: Pulmonary Disease

## 2021-12-04 ENCOUNTER — Other Ambulatory Visit: Payer: Self-pay | Admitting: Pulmonary Disease

## 2021-12-06 DIAGNOSIS — L988 Other specified disorders of the skin and subcutaneous tissue: Secondary | ICD-10-CM | POA: Diagnosis not present

## 2021-12-06 DIAGNOSIS — D485 Neoplasm of uncertain behavior of skin: Secondary | ICD-10-CM | POA: Diagnosis not present

## 2021-12-15 ENCOUNTER — Ambulatory Visit (INDEPENDENT_AMBULATORY_CARE_PROVIDER_SITE_OTHER): Payer: Medicare Other | Admitting: Psychiatry

## 2021-12-15 DIAGNOSIS — F332 Major depressive disorder, recurrent severe without psychotic features: Secondary | ICD-10-CM

## 2021-12-15 DIAGNOSIS — F411 Generalized anxiety disorder: Secondary | ICD-10-CM | POA: Diagnosis not present

## 2021-12-15 NOTE — Progress Notes (Signed)
Virtual Visit via Telephone Note ? ?I connected with Lauren [name] on 12/15/21 at 1:08 PM EDT  by telephone and verified that I am speaking with the correct person using two identifiers. ? ?Location: ?Patient: Home ?Provider: [facility] [place] office  ?  ?I discussed the limitations, risks, security and privacy concerns of performing an evaluation and management service by telephone and the availability of in person appointments. I also discussed with the patient that there may be a patient responsible charge related to this service. The patient expressed understanding and agreed to proceed. ? ? ? ?I provided 50 minutes of non-face-to-face time during this encounter. ? ? ?[place], LCSW ? ? ? ? ? ? ? ? ? ?   THERAPIST PROGRESS NOTE  ? ? ?Session Time:  Wednesday 12/15/2021 1:08 PM - 1:58 PM  ? ?Participation Level: Active ? ?Behavioral Response: CasualAlert/less depressed, less anxious ? ?Type of Therapy: Individual Therapy ? ?Treatment Goals addressed: Patient will score less than 10 on the patient health questionnaire, patient will identify 3 cognitive patterns and beliefs that support depression ? ?Progress on Goals: Progressing ? ?Interventions: CBT and Supportive ? ?Summary: Lauren [name] is a 71 y.o. female who is referred for services due to stress, anxiety, and depression.  She has participated in therapy intermittently for the past 25 years.  She reports 3 psychiatric hospitalizations with the last 1 occurring over 5 years ago at [facility].  Patient continues to deal with grief and loss issues regarding the death of her husband last year.  She also reports ruminations, difficulty completing tasks at home, depressed mood, and irritability.  She reports staying at home more as she has memory difficulty and is embarrassed about this.  She also reports fears that she may have to go to a nursing home should she have dementia.  She is scheduled for a follow-up appointment with  psychologist regarding recent cognitive testing.   ? ?     ?Patient last was seen via virtual visit about 2 weeks ago. She reports decreased symptoms of depression as reflected in PHQ-9.  She reports reports improved mood.  She has been busy engaging in various activities and having more social contact.  She reports appropriate sadness regarding the death of her friend as well as the death of her friends dog.  Patient also reports using her spirituality including prayer and meditation has improved her mood.  She continues to write a daily gratitude list.  Patient also reports reviewing handouts provided in session and using thought log as well as Socratic questioning has been helpful.  Patient reports enjoying celebrating her birthday on [birth year] by going out with friends.   ? ?Suicidal/Homicidal: Nowithout intent/plan ?   ?Therapist Response: reviewed symptoms, administered PHQ-9, praised and reinforced patient's increased behavioral activation/socialization/use of spirituality, discussed effects on thoughts/mood/behavior, praised and reinforced patient's use of thought log and Socratic questioning, processed thought log, began to provide psychoeducation on the connection between core beliefs/assumptions-rules/and automatic thoughts using handout, developed plan with patient to write down, and assumptions-rules she has, developed plan with patient to continue using thought log and Socratic questioning, will send patient handout (What are  core beliefs, core belief magnet metaphor, core beliefs) in preparation for next session ? ? ?Diagnosis: Axis I: MDD, Recurrent, GAD ? ?Collaboration of Care: Other none needed at this session ? ?Patient/Guardian was advised Release of Information must be obtained prior to any record release in order to collaborate their care  with an outside provider. Patient/Guardian was advised if they have not already done so to contact the registration department to sign all necessary forms in  order for [place] to release information regarding their care.  ? ?Consent: Patient/Guardian gives verbal consent for treatment and assignment of benefits for services provided during this visit. Patient/Guardian expressed understanding and agreed to proceed.  ?

## 2021-12-23 ENCOUNTER — Telehealth (INDEPENDENT_AMBULATORY_CARE_PROVIDER_SITE_OTHER): Payer: Medicare Other | Admitting: Psychiatry

## 2021-12-23 ENCOUNTER — Encounter (HOSPITAL_COMMUNITY): Payer: Self-pay | Admitting: Psychiatry

## 2021-12-23 DIAGNOSIS — F411 Generalized anxiety disorder: Secondary | ICD-10-CM

## 2021-12-23 DIAGNOSIS — F332 Major depressive disorder, recurrent severe without psychotic features: Secondary | ICD-10-CM | POA: Diagnosis not present

## 2021-12-23 MED ORDER — VENLAFAXINE HCL ER 75 MG PO CP24: 225.0000 mg | ORAL_CAPSULE | Freq: Every day | ORAL | 2 refills | Status: DC

## 2021-12-23 MED ORDER — LAMOTRIGINE 100 MG PO TABS: 100.0000 mg | ORAL_TABLET | Freq: Two times a day (BID) | ORAL | 2 refills | Status: DC

## 2021-12-23 MED ORDER — LORAZEPAM 1 MG PO TABS: 1.0000 mg | ORAL_TABLET | Freq: Every day | ORAL | 2 refills | Status: DC

## 2021-12-23 NOTE — Progress Notes (Signed)
Virtual Visit via Telephone Note ? ?I connected with Lauren [name] on 12/23/21 at  1:00 PM EDT by telephone and verified that I am speaking with the correct person using two identifiers. ? ?Location: ?Patient: home ?Provider: office ?  ?I discussed the limitations, risks, security and privacy concerns of performing an evaluation and management service by telephone and the availability of in person appointments. I also discussed with the patient that there may be a patient responsible charge related to this service. The patient expressed understanding and agreed to proceed. ? ? ? ?  ?I discussed the assessment and treatment plan with the patient. The patient was provided an opportunity to ask questions and all were answered. The patient agreed with the plan and demonstrated an understanding of the instructions. ?  ?The patient was advised to call back or seek an in-person evaluation if the symptoms worsen or if the condition fails to improve as anticipated. ? ?I provided 15 minutes of non-face-to-face time during this encounter. ? ? ?Levonne Spiller, MD ? ?BH MD/PA/NP OP Progress Note ? ?12/23/2021 1:17 PM ?Lauren [name]  ?MRN:  [id number] ? ?Chief Complaint:  ?Chief Complaint  ?Patient presents with  ? Depression  ? Anxiety  ? Follow-up  ? ?HPI: This patient is a 71 year old widowed white female who lives alone in [place]. She has one stepchild. Her step son killed himself 12 years ago.Marland Kitchen He was an alcoholic. The patient is on disability for fibromyalgia ? ?The patient returns for follow-up after 3 months.  She states that she is doing better.  She is really benefiting from her therapy with Maurice Small.  She is still grieving her husband who died last year but she is not letting it over take her life anymore.  She is getting out and doing things with friends.  She states that her mood has generally been stable.  She denies severe depression or anxiety thoughts of self-harm or suicide.  She denies racing thoughts  irritability impulsivity or manic symptoms.  She is sleeping fairly well and her appetite is good. ?Visit Diagnosis:  ?  ICD-10-CM   ?1. Major depressive disorder, recurrent, severe without psychotic features ([place])  F33.2   ?  ?2. Generalized anxiety disorder  F41.1   ?  ? ? ?Past Psychiatric History: Long history of depression and mood instability.  She was hospitalized about 6 years ago for suicidal ideation ? ?Past Medical History:  ?Past Medical History:  ?Diagnosis Date  ? Anxiety   ? Chronic pain   ? COPD (chronic obstructive pulmonary disease) ([place])   ? Depression   ? Fibromyalgia   ? Fibromyalgia   ? GERD (gastroesophageal reflux disease)   ? Headache   ? Hepatitis C   ? History of blood transfusion   ? History of bronchitis   ? IBS (irritable bowel syndrome)   ? Osteoarthritis   ? Pneumonia   ? Prediabetes   ? Shortness of breath dyspnea   ? allergies; increased pain;   ? Vertigo   ? Wears glasses   ?  ?Past Surgical History:  ?Procedure Laterality Date  ? BIOPSY  09/16/2020  ? Procedure: BIOPSY;  Surgeon: Harvel Quale, MD;  [facility];  Service: Gastroenterology;;  ? CATARACT EXTRACTION W/PHACO Right 08/01/2017  ? Procedure: CATARACT EXTRACTION PHACO AND INTRAOCULAR LENS PLACEMENT RIGHT EYE;  Surgeon: Rutherford Guys, MD;  Location: [facility];  Service: Ophthalmology;  Laterality: Right;  CDE: 3.30  ? CATARACT EXTRACTION W/PHACO Left  08/15/2017  ? Procedure: CATARACT EXTRACTION PHACO AND INTRAOCULAR LENS PLACEMENT (IOC);  Surgeon: Rutherford Guys, MD;  Location: [facility];  Service: Ophthalmology;  Laterality: Left;  CDE: 4.00  ? CHOLECYSTECTOMY    ? COLONOSCOPY    ? COLONOSCOPY N/A 10/23/2014  ? Procedure: COLONOSCOPY;  Surgeon: Rogene Houston, MD;  [facility];  Service: Endoscopy;  Laterality: N/A;  1030  ? ESOPHAGOGASTRODUODENOSCOPY N/A 10/23/2014  ? Procedure: ESOPHAGOGASTRODUODENOSCOPY (EGD);  Surgeon: Rogene Houston, MD;  [facility];  Service: Endoscopy;   Laterality: N/A;  ? ESOPHAGOGASTRODUODENOSCOPY (EGD) WITH PROPOFOL N/A 09/16/2020  ? Procedure: ESOPHAGOGASTRODUODENOSCOPY (EGD) WITH PROPOFOL;  Surgeon: Harvel Quale, MD;  [facility];  Service: Gastroenterology;  Laterality: N/A;  7:30  ? KNEE ARTHROSCOPY Left 10/21/2015  ? Procedure: ARTHROSCOPY LEFT KNEE WITH MENICAL DEBRIDEMENT, Chondroplasty;  Surgeon: Gaynelle Arabian, MD;  Location: [facility];  Service: Orthopedics;  Laterality: Left;  ? MANDIBLE SURGERY  09/06/1979  ? ORIF WRIST FRACTURE Right 07/23/2013  ? Procedure: OPEN REDUCTION INTERNAL FIXATION (ORIF) WRIST FRACTURE;  Surgeon: Roseanne Kaufman, MD;  Location: [facility];  Service: Orthopedics;  Laterality: Right;  ? SAVORY DILATION  09/16/2020  ? Procedure: SAVORY DILATION;  Surgeon: Montez Morita, Quillian Quince, MD;  [facility];  Service: Gastroenterology;;  ? TONSILLECTOMY    ? TOTAL KNEE ARTHROPLASTY    ? 2011 rt knee  ? TOTAL KNEE ARTHROPLASTY Left 04/16/2018  ? Procedure: LEFT TOTAL KNEE ARTHROPLASTY;  Surgeon: Gaynelle Arabian, MD;  Location: [facility];  Service: Orthopedics;  Laterality: Left;  ? UPPER GASTROINTESTINAL ENDOSCOPY    ? ? ?Family Psychiatric History: See below ? ?Family History:  ?Family History  ?Problem Relation Age of Onset  ? Alcohol abuse Father   ? Diabetes Father   ? Stroke Father   ? Depression Father   ? Hypertension Father   ? Heart attack Father   ? High Cholesterol Father   ? Alcohol abuse Maternal Grandfather   ? Alcohol abuse Maternal Grandmother   ? Hypertension Maternal Grandmother   ? Alcohol abuse Paternal Grandfather   ? Alcohol abuse Paternal Grandmother   ? Diabetes Paternal Grandmother   ? Stroke Mother   ? Irritable bowel syndrome Mother   ? Sexual abuse Mother   ? Hypertension Mother   ? Breast cancer Mother   ? Diabetes Brother   ? Healthy Brother   ? Diabetes Brother   ? Heart disease Brother   ? Sarcoidosis Brother   ? Heart failure Brother   ? Anxiety disorder Paternal Uncle   ? Alcohol abuse  Paternal Uncle   ? Alcohol abuse Cousin   ? Anxiety disorder Maternal Uncle   ? ADD / ADHD Neg Hx   ? Bipolar disorder Neg Hx   ? Dementia Neg Hx   ? Drug abuse Neg Hx   ? Paranoid behavior Neg Hx   ? Schizophrenia Neg Hx   ? Seizures Neg Hx   ? Physical abuse Neg Hx   ? ? ?Social History:  ?Social History  ? ?Socioeconomic History  ? Marital status: Widowed  ?  Spouse name: Not on file  ? Number of children: Not on file  ? Years of education: Not on file  ? Highest education level: Not on file  ?Occupational History  ? Not on file  ?Tobacco Use  ? Smoking status: Former  ?  Packs/day: 2.00  ?  Years: 15.00  ?  Pack years: 30.00  ?  Types: Cigarettes  ?  Quit date: 09/05/1982  ?  Years since quitting: 39.3  ? Smokeless tobacco: Never  ?Vaping Use  ? Vaping Use: Never used  ?Substance and Sexual Activity  ? Alcohol use: No  ?  Alcohol/week: 0.0 standard drinks  ?  Comment: history of alcoholism; pt states has been sober 28 years  ? Drug use: No  ? Sexual activity: Never  ?Other Topics Concern  ? Not on file  ?Social History Narrative  ? Not on file  ? ?Social Determinants of Health  ? ?Financial Resource Strain: Not on file  ?Food Insecurity: Not on file  ?Transportation Needs: Not on file  ?Physical Activity: Not on file  ?Stress: Not on file  ?Social Connections: Not on file  ? ? ?Allergies:  ?Allergies  ?Allergen Reactions  ? Elavil [Amitriptyline] Other (See Comments)  ?  Vivid dreams and almost suicidal  ? Flagyl [Metronidazole] Itching  ? Vistaril [Hydroxyzine Hcl] Other (See Comments)  ?  Got higher than a kite on too much of this.  ? Geodon [Ziprasidone Hydrochloride] Other (See Comments)  ?  Blacked out  ? Lactose Intolerance (Gi) Other (See Comments)  ?  GI upset  ? Latex Other (See Comments)  ?  Red at site  ? Other Other (See Comments)  ? ? ?Metabolic Disorder Labs: ?Lab Results  ?Component Value Date  ? HGBA1C 6.1 (H) 10/13/2015  ? MPG 128 10/13/2015  ? ?No results found for: PROLACTIN ?No results found  for: CHOL, TRIG, HDL, CHOLHDL, VLDL, LDLCALC ?Lab Results  ?Component Value Date  ? TSH 2.400 03/19/2014  ? ? ?Therapeutic Level Labs: ?No results found for: LITHIUM ?No results found for: VALPROATE ?No co

## 2021-12-29 ENCOUNTER — Other Ambulatory Visit (HOSPITAL_COMMUNITY): Payer: Self-pay | Admitting: Psychiatry

## 2021-12-29 ENCOUNTER — Ambulatory Visit (INDEPENDENT_AMBULATORY_CARE_PROVIDER_SITE_OTHER): Payer: Medicare Other | Admitting: Psychiatry

## 2021-12-29 DIAGNOSIS — F411 Generalized anxiety disorder: Secondary | ICD-10-CM | POA: Diagnosis not present

## 2021-12-29 DIAGNOSIS — F332 Major depressive disorder, recurrent severe without psychotic features: Secondary | ICD-10-CM

## 2021-12-29 NOTE — Progress Notes (Signed)
Virtual Visit via Telephone Note ? ?I connected with Lauren [name] on 12/29/21 at 1:10 PM EDT  by telephone and verified that I am speaking with the correct person using two identifiers. ? ?Location: ?Patient: Home ?Provider: [facility] [place] office  ?  ?I discussed the limitations, risks, security and privacy concerns of performing an evaluation and management service by telephone and the availability of in person appointments. I also discussed with the patient that there may be a patient responsible charge related to this service. The patient expressed understanding and agreed to proceed. ? ? ?I provided 45 minutes of non-face-to-face time during this encounter. ? ? ?[place], LCSW ? ? ? ? ? ? ? ? ? ?   THERAPIST PROGRESS NOTE  ? ? ?Session Time:  Wednesday 12/29/2021 1:10 PM  - 1:55 PM  ? ?Participation Level: Active ? ?Behavioral Response: CasualAlert/less depressed, less anxious ? ?Type of Therapy: Individual Therapy ? ?Treatment Goals addressed: Patient will score less than 10 on the patient health questionnaire, patient will identify 3 cognitive patterns and beliefs that support depression ? ?Progress on Goals: Progressing ? ?Interventions: CBT and Supportive ? ?Summary: Lauren [name] is a 71 y.o. female who is referred for services due to stress, anxiety, and depression.  She has participated in therapy intermittently for the past 25 years.  She reports 3 psychiatric hospitalizations with the last 1 occurring over 5 years ago at [facility].  Patient continues to deal with grief and loss issues regarding the death of her husband last year.  She also reports ruminations, difficulty completing tasks at home, depressed mood, and irritability.  She reports staying at home more as she has memory difficulty and is embarrassed about this.  She also reports fears that she may have to go to a nursing home should she have dementia.  She is scheduled for a follow-up appointment with  psychologist regarding recent cognitive testing.   ? ?     ?Patient last was seen via virtual visit about 2 weeks ago. She reports doing very well since last session until about 4 to 5 days ago.  She received messages from her step grandson requesting his grandfather's truck.  In addition, she received messages from his mother and sister requesting patient give her step grandson her husband's truck.  Patient expresses anger and frustration as husband's daughter and her children had Lauren involvement with patient's husband other than in asking for something per her report.  She also reports conflict and feelings as she wants to do what she perceives as the right thing as she also worries about others opinions.  The situations has triggered core belief of I am a bad person.  Patient reports anxiety, anger, and ruminating thoughts.  ? ? Suicidal/Homicidal: Nowithout intent/plan ?   ?Therapist Response: reviewed symptoms, discussed stressors, facilitated expression of thoughts and feelings, validated feelings used situation with step grandson to identify the connection between automatic thoughts/assumptions-rules/core beliefs, assisted patient identify effects on her mood and behavior, assisted patient began to identify compensatory strategies she tends to use including pleasing people, discussed option of not making a decision about the truck at this time as patient reports she is not emotionally ready to deal with this, assisted patient identify ways to have assertive communication with step grandson as well as ways to set and maintain limits with stepdaughter and step granddaughter, discussed possible script  ? ?Diagnosis: Axis I: MDD, Recurrent, GAD ? ?Collaboration of Care: Other none needed  at this session ? ?Patient/Guardian was advised Release of Information must be obtained prior to any record release in order to collaborate their care with an outside provider. Patient/Guardian was advised if they have not  already done so to contact the registration department to sign all necessary forms in order for [place] to release information regarding their care.  ? ?Consent: Patient/Guardian gives verbal consent for treatment and assignment of benefits for services provided during this visit. Patient/Guardian expressed understanding and agreed to proceed.  ?

## 2022-01-13 ENCOUNTER — Ambulatory Visit (HOSPITAL_COMMUNITY): Payer: Medicare Other | Admitting: Psychiatry

## 2022-01-13 ENCOUNTER — Encounter (HOSPITAL_COMMUNITY): Payer: Self-pay

## 2022-01-26 DIAGNOSIS — Z1159 Encounter for screening for other viral diseases: Secondary | ICD-10-CM | POA: Diagnosis not present

## 2022-01-26 DIAGNOSIS — E782 Mixed hyperlipidemia: Secondary | ICD-10-CM | POA: Diagnosis not present

## 2022-01-26 DIAGNOSIS — E7800 Pure hypercholesterolemia, unspecified: Secondary | ICD-10-CM | POA: Diagnosis not present

## 2022-01-26 DIAGNOSIS — M797 Fibromyalgia: Secondary | ICD-10-CM | POA: Diagnosis not present

## 2022-01-26 DIAGNOSIS — E1169 Type 2 diabetes mellitus with other specified complication: Secondary | ICD-10-CM | POA: Diagnosis not present

## 2022-01-26 DIAGNOSIS — Z23 Encounter for immunization: Secondary | ICD-10-CM | POA: Diagnosis not present

## 2022-01-26 DIAGNOSIS — I10 Essential (primary) hypertension: Secondary | ICD-10-CM | POA: Diagnosis not present

## 2022-01-26 DIAGNOSIS — Z0000 Encounter for general adult medical examination without abnormal findings: Secondary | ICD-10-CM | POA: Diagnosis not present

## 2022-01-27 ENCOUNTER — Ambulatory Visit (INDEPENDENT_AMBULATORY_CARE_PROVIDER_SITE_OTHER): Payer: Medicare Other | Admitting: Psychiatry

## 2022-01-27 DIAGNOSIS — F332 Major depressive disorder, recurrent severe without psychotic features: Secondary | ICD-10-CM

## 2022-01-27 DIAGNOSIS — F411 Generalized anxiety disorder: Secondary | ICD-10-CM

## 2022-01-27 NOTE — Progress Notes (Signed)
Virtual Visit via Telephone Note  I connected with Lauren [name] on 01/27/22 at 2:10 PM EDT by telephone and verified that I am speaking with the correct person using two identifiers.  Location: Patient:Home Provider: [place] office    I discussed the limitations, risks, security and privacy concerns of performing an evaluation and management service by telephone and the availability of in person appointments. I also discussed with the patient that there may be a patient responsible charge related to this service. The patient expressed understanding and agreed to proceed.   I provided 45 minutes of non-face-to-face time during this encounter.   [name], LCSW             THERAPIST PROGRESS NOTE    Session Time:  Thursday 01/27/2022 2:10 PM - 2:55 PM   Participation Level: Active  Behavioral Response: CasualAlert/less depressed, less anxious  Type of Therapy: Individual Therapy  Treatment Goals addressed: Patient will score less than 10 on the patient health questionnaire, patient will identify 3 cognitive patterns and beliefs that support depression  Progress on Goals: Progressing  Interventions: CBT and Supportive  Summary: Lauren [name] is a 71 y.o. female who is referred for services due to stress, anxiety, and depression.  She has participated in therapy intermittently for the past 25 years.  She reports 3 psychiatric hospitalizations with the last 1 occurring over 5 years ago at [facility].  Patient continues to deal with grief and loss issues regarding the death of her husband last year.  She also reports ruminations, difficulty completing tasks at home, depressed mood, and irritability.  She reports staying at home more as she has memory difficulty and is embarrassed about this.  She also reports fears that she may have to go to a nursing home should she have dementia.  She is scheduled for a follow-up appointment with  psychologist regarding recent cognitive testing.         Patient last was seen via virtual visit about 4 weeks ago. She reports continuing to do very well overall since last session.  She is very pleased with the way she cope with her deceased husband's birthday on 01/04/2022.  She reports spending time with her neighbors and sharing memories of her husband.  She tries to maintain involvement in activities.  She also reports less stress regarding situation regarding her step grandson as she has decided to give him her husband's truck.  She reports increased awareness of her thought patterns and successfully has used Socratic questioning to identify/challenge/replace negative thoughts.  Patient continues to use thought log as well  Suicidal/Homicidal: Nowithout intent/plan    Therapist Response: reviewed symptoms, discussed stressors, facilitated expression of thoughts and feelings, validated feelings, praised and reinforced patient's use of Socratic questioning and thought logs, reviewed recent incident/thought pattern using Socratic questioning, provided psychoeducation on typical compensatory strategies, assisted patient identify her compensatory strategies, assisted patient identify the connection between compensatory strategies and core beliefs, develop plan with patient to continue using Socratic questioning and thought logs, will send patient handouts via mail      Diagnosis: Axis I: MDD, Recurrent, GAD  Collaboration of Care: Other none needed at this session  Patient/Guardian was advised Release of Information must be obtained prior to any record release in order to collaborate their care with an outside provider. Patient/Guardian was advised if they have not already done so to contact the registration department to sign all necessary forms in order for [place]  to release information regarding their care.   Consent: Patient/Guardian gives verbal consent for treatment and assignment of benefits for  services provided during this visit. Patient/Guardian expressed understanding and agreed to proceed.

## 2022-02-10 ENCOUNTER — Ambulatory Visit (INDEPENDENT_AMBULATORY_CARE_PROVIDER_SITE_OTHER): Payer: Medicare Other | Admitting: Psychiatry

## 2022-02-10 DIAGNOSIS — F332 Major depressive disorder, recurrent severe without psychotic features: Secondary | ICD-10-CM

## 2022-02-10 DIAGNOSIS — F411 Generalized anxiety disorder: Secondary | ICD-10-CM | POA: Diagnosis not present

## 2022-02-10 NOTE — Progress Notes (Signed)
Virtual Visit via Telephone Note  I connected with Lauren [name] on 02/10/22 at 2:06 PM EDT  by telephone and verified that I am speaking with the correct person using two identifiers.  Location: Patient: Home Provider: [place] office    I discussed the limitations, risks, security and privacy concerns of performing an evaluation and management service by telephone and the availability of in person appointments. I also discussed with the patient that there may be a patient responsible charge related to this service. The patient expressed understanding and agreed to proceed.     I provided 43 minutes of non-face-to-face time during this encounter.   [name], LCSW             THERAPIST PROGRESS NOTE    Session Time:  Thursday 02/10/2022 2:06 PM  - 2:49 PM   Participation Level: Active  Behavioral Response: CasualAlert/depressed, tearful  Type of Therapy: Individual Therapy  Treatment Goals addressed: Patient will score less than 10 on the patient health questionnaire, patient will identify 3 cognitive patterns and beliefs that support depression  Progress on Goals: Progressing  Interventions: CBT and Supportive  Summary: Lauren [name] is a 71 y.o. female who is referred for services due to stress, anxiety, and depression.  She has participated in therapy intermittently for the past 25 years.  She reports 3 psychiatric hospitalizations with the last 1 occurring over 5 years ago at [facility].  Patient continues to deal with grief and loss issues regarding the death of her husband last year.  She also reports ruminations, difficulty completing tasks at home, depressed mood, and irritability.  She reports staying at home more as she has memory difficulty and is embarrassed about this.  She also reports fears that she may have to go to a nursing home should she have dementia.  She is scheduled for a follow-up appointment with  psychologist regarding recent cognitive testing.         Patient last was seen via virtual visit about 4 weeks ago. She reports increased stress and depressed mood since last session.  Per patient's report, this was triggered by fibromyalgia flareup precipitated by the change in weather.  Patient expresses frustration his pain is very excruciating and affects her ability to participate in activities.  She reports frustration as she push self to obtain an event last night although she was in severe pain.  She reports having a rational negative thoughts regarding the interaction with one of the people at the event.  Patient reports sleep difficulty and ruminating thoughts.  She expresses frustration as she was unable to process thoughts using the thought log last night as she also experiences fibromyalgia fog during these flareups.     Suicidal/Homicidal: Nowithout intent/plan    Therapist Response: reviewed symptoms, discussed stressors, facilitated expression of thoughts and feelings, validated feelings, assisted patient identify realistic expectations of self during a fibromyalgia flareup, assisted patient identify thoughts and processes that inhibited use of effective assertion, assisted patient identify connection between thoughts/mood/and behavior regarding situation with friend, assisted patient identify strategies to cope with increased depressed mood and discussed using various tools depending upon the situation, develop plan with patient to review previously mailed handouts on depression and ways to intervene, also develop plan with patient to continue using thought log when needed     Diagnosis: Axis I: MDD, Recurrent, GAD         Collaboration of Care: Other none needed at this session  Patient/Guardian was advised Release of Information must be obtained prior to any record release in order to collaborate their care with an outside provider. Patient/Guardian was advised if they have not already  done so to contact the registration department to sign all necessary forms in order for [place] to release information regarding their care.   Consent: Patient/Guardian gives verbal consent for treatment and assignment of benefits for services provided during this visit. Patient/Guardian expressed understanding and agreed to proceed.

## 2022-02-24 ENCOUNTER — Ambulatory Visit (INDEPENDENT_AMBULATORY_CARE_PROVIDER_SITE_OTHER): Payer: Medicare Other | Admitting: Psychiatry

## 2022-02-24 DIAGNOSIS — F332 Major depressive disorder, recurrent severe without psychotic features: Secondary | ICD-10-CM

## 2022-02-24 NOTE — Progress Notes (Addendum)
Virtual Visit via Telephone Note  I connected with Lauren [name] on 02/24/22 at 3:12 PM EDT  by telephone and verified that I am speaking with the correct person using two identifiers.  Location: Patient: Home Provider: [place] office    I discussed the limitations, risks, security and privacy concerns of performing an evaluation and management service by telephone and the availability of in person appointments. I also discussed with the patient that there may be a patient responsible charge related to this service. The patient expressed understanding and agreed to proceed.    I provided 43 minutes of non-face-to-face time during this encounter.   [name], LCSW             THERAPIST PROGRESS NOTE    Session Time:  Thursday 02/24/2022 3:12 PM -  3:55 PM  Participation Level: Active  Behavioral Response: CasualAlert/depressed, tearful  Type of Therapy: Individual Therapy  Treatment Goals addressed: Patient will score less than 10 on the patient health questionnaire, patient will identify 3 cognitive patterns and beliefs that support depression  Progress on Goals: Not Progressing  Interventions: CBT and Supportive  Summary: Lauren [name] is a 71 y.o. female who is referred for services due to stress, anxiety, and depression.  She has participated in therapy intermittently for the past 25 years.  She reports 3 psychiatric hospitalizations with the last 1 occurring over 5 years ago at [facility].  Patient continues to deal with grief and loss issues regarding the death of her husband last year.  She also reports ruminations, difficulty completing tasks at home, depressed mood, and irritability.  She reports staying at home more as she has memory difficulty and is embarrassed about this.  She also reports fears that she may have to go to a nursing home should she have dementia.  She is scheduled for a follow-up appointment with psychologist  regarding recent cognitive testing.         Patient last was seen via virtual visit about 4 weeks ago. She reports continued stress and depressed mood since last session.  She continues to experience fibromyalgia flareups precipitated by the change in weather.  This has triggered increased ruminating thoughts about her interaction with her husband as well as negative thoughts about self along with feelings of shame.  Patient reports thoughts start to spiral and calls her to feel even more depressed.  She has been trying to listen to music and as well as journaling to try to cope.  She continues to report sleep difficulty.   Suicidal/Homicidal: Nowithout intent/plan    Therapist Response: reviewed symptoms, discussed stressors, facilitated expression of thoughts and feelings, validated feelings, assisted patient do ABC behavioral analysis of her pattern inresponse to pain, assisted patient identify ways to disrupt pattern of spiraling thoughts, assisted patient identify/challenge/and replace irrational thought patterns, discussed self compassion, also discussed using leaves on a stream exercise, developed plan with patient to use a coping card with strategies discussed, discussed rationale for and developed plan with patient to practice a relaxation technique daily, patient also will follow-up with psychiatrist Dr. Harrington Challenger for medication concerns regarding sleep difficulty   diagnosis: Axis I: MDD, Recurrent, GAD         Collaboration of Care: Other none needed at this session  Patient/Guardian was advised Release of Information must be obtained prior to any record release in order to collaborate their care with an outside provider. Patient/Guardian was advised if they have not already done  so to contact the registration department to sign all necessary forms in order for [place] to release information regarding their care.   Consent: Patient/Guardian gives verbal consent for treatment and assignment of  benefits for services provided during this visit. Patient/Guardian expressed understanding and agreed to proceed.

## 2022-03-10 ENCOUNTER — Ambulatory Visit (INDEPENDENT_AMBULATORY_CARE_PROVIDER_SITE_OTHER): Payer: Medicare Other | Admitting: Psychiatry

## 2022-03-10 DIAGNOSIS — F332 Major depressive disorder, recurrent severe without psychotic features: Secondary | ICD-10-CM

## 2022-03-10 DIAGNOSIS — F411 Generalized anxiety disorder: Secondary | ICD-10-CM | POA: Diagnosis not present

## 2022-03-10 NOTE — Progress Notes (Signed)
Virtual Visit via Video Note  I connected with Lauren [name] on 03/10/22 at 2:05 PM EDT  by a video enabled telemedicine application and verified that I am speaking with the correct person using two identifiers.  Location: Patient: Home Provider: [place] Office   I discussed the limitations of evaluation and management by telemedicine and the availability of in person appointments. The patient expressed understanding and agreed to proceed.  I provided 40 minutes of non-face-to-face time during this encounter.   [name], LCSW            THERAPIST PROGRESS NOTE    Session Time:  Thursday 03/10/2022 2:05 PM - 2:45 PM   Participation Level: Active  Behavioral Response: CasualAlert/depressed,   Type of Therapy: Individual Therapy  Treatment Goals addressed: Patient will score less than 10 on the patient health questionnaire, patient will identify 3 cognitive patterns and beliefs that support depression  Progress on Goals: Progressing  Interventions: CBT and Supportive  Summary: Lauren [name] is a 71 y.o. female who is referred for services due to stress, anxiety, and depression.  She has participated in therapy intermittently for the past 25 years.  She reports 3 psychiatric hospitalizations with the last 1 occurring over 5 years ago at [facility].  Patient continues to deal with grief and loss issues regarding the death of her husband last year.  She also reports ruminations, difficulty completing tasks at home, depressed mood, and irritability.  She reports staying at home more as she has memory difficulty and is embarrassed about this.  She also reports fears that she may have to go to a nursing home should she have dementia.  She is scheduled for a follow-up appointment with psychologist regarding recent cognitive testing.         Patient last was seen via virtual visit about 4 weeks ago. She reports continued stress and symptoms of  depression as reflected in the PHQ-9.  She also has continued to experience pain related to fibromyalgia.  However, she has been trying to cope by pacing self regarding doing activities as she reports feeling better when she accomplishes small household projects.  She reports a couple of incidents of experiencing negative ruminating thoughts about self triggered by comments from her friends.  Patient reports leaving the situation and having crying episodes.  Eventually, she used thought logs as well as Socratic questioning to identify/challenge forward/and replace negative thoughts.  She continues to listen to music as another coping strategy. Suicidal/Homicidal: Nowithout intent/plan    Therapist Response: reviewed symptoms, discussed stressors, facilitated expression of thoughts and feelings, validated feelings, praised and reinforced patient's efforts to use thought logs and Socratic questioning, discussed effects, praised and reinforced patient's involvement to pace self and increased involvement in activity, discussed effects, began to discuss next steps for treatment to include improving assertiveness skills.  diagnosis: Axis I: MDD, Recurrent, GAD         Collaboration of Care: Other none needed at this session  Patient/Guardian was advised Release of Information must be obtained prior to any record release in order to collaborate their care with an outside provider. Patient/Guardian was advised if they have not already done so to contact the registration department to sign all necessary forms in order for [place] to release information regarding their care.   Consent: Patient/Guardian gives verbal consent for treatment and assignment of benefits for services provided during this visit. Patient/Guardian expressed understanding and agreed to proceed.

## 2022-03-15 DIAGNOSIS — M1990 Unspecified osteoarthritis, unspecified site: Secondary | ICD-10-CM | POA: Diagnosis not present

## 2022-03-15 DIAGNOSIS — M1380 Other specified arthritis, unspecified site: Secondary | ICD-10-CM | POA: Diagnosis not present

## 2022-03-15 DIAGNOSIS — M810 Age-related osteoporosis without current pathological fracture: Secondary | ICD-10-CM | POA: Diagnosis not present

## 2022-03-15 DIAGNOSIS — M25539 Pain in unspecified wrist: Secondary | ICD-10-CM | POA: Diagnosis not present

## 2022-03-15 DIAGNOSIS — E119 Type 2 diabetes mellitus without complications: Secondary | ICD-10-CM | POA: Diagnosis not present

## 2022-03-15 DIAGNOSIS — M8589 Other specified disorders of bone density and structure, multiple sites: Secondary | ICD-10-CM | POA: Diagnosis not present

## 2022-03-15 DIAGNOSIS — H5711 Ocular pain, right eye: Secondary | ICD-10-CM | POA: Diagnosis not present

## 2022-03-22 ENCOUNTER — Other Ambulatory Visit: Payer: Self-pay | Admitting: Pulmonary Disease

## 2022-03-24 ENCOUNTER — Ambulatory Visit (HOSPITAL_COMMUNITY): Payer: Medicare Other | Admitting: Psychiatry

## 2022-04-11 ENCOUNTER — Ambulatory Visit (INDEPENDENT_AMBULATORY_CARE_PROVIDER_SITE_OTHER): Payer: Medicare Other | Admitting: Psychiatry

## 2022-04-11 DIAGNOSIS — F332 Major depressive disorder, recurrent severe without psychotic features: Secondary | ICD-10-CM | POA: Diagnosis not present

## 2022-04-11 DIAGNOSIS — F411 Generalized anxiety disorder: Secondary | ICD-10-CM | POA: Diagnosis not present

## 2022-04-11 NOTE — Progress Notes (Signed)
Virtual Visit via Telephone Note  I connected with [name] on 04/11/22 at 10:00 AM EDT by telephone and verified that I am speaking with the correct person using two identifiers.  Location: Patient: Home Provider: [place] office    I discussed the limitations, risks, security and privacy concerns of performing an evaluation and management service by telephone and the availability of in person appointments. I also discussed with the patient that there may be a patient responsible charge related to this service. The patient expressed understanding and agreed to proceed.  I provided 55 minutes of non-face-to-face time during this encounter.   [name], LCSW              THERAPIST PROGRESS NOTE    Session Time:  Monday 04/11/2022 10:00 AM - 10:55 AM   Participation Level: Active  Behavioral Response: CasualAlert/depressed,   Type of Therapy: Individual Therapy  Treatment Goals addressed: Patient will score less than 10 on the patient health questionnaire, patient will identify 3 cognitive patterns and beliefs that support depression  Progress on Goals: Progressing  Interventions: CBT and Supportive  Summary: Lauren [name] is a 71 y.o. female who is referred for services due to stress, anxiety, and depression.  She has participated in therapy intermittently for the past 25 years.  She reports 3 psychiatric hospitalizations with the last 1 occurring over 5 years ago at [facility].  Patient continues to deal with grief and loss issues regarding the death of her husband last year.  She also reports ruminations, difficulty completing tasks at home, depressed mood, and irritability.  She reports staying at home more as she has memory difficulty and is embarrassed about this.  She also reports fears that she may have to go to a nursing home should she have dementia.  She is scheduled for a follow-up appointment with psychologist regarding  recent cognitive testing.           Patient last was seen via virtual visit about 4 weeks ago. She reports decreased intensity/frequency of symptoms of depression as reflected in the PHQ-9.  She reports being very involved in supporting a friend whose son died last month.  Patient reports visiting friend and helping out with household chores.  Patient reports this was helpful as she focused on others.  During this time, she also managed the anniversary of her husband's death.  She reports managing this fairly well.  However, she expresses frustration with self as she still is having difficulty resuming attendance at [employer] as well as church.  She also reports difficulty forcing self to run errands such as go to the grocery store.  She continues to worry about what others may think. She expresses frustration regarding her memory fears others will judge her regarding her memory difficulty.  She also reports significant sleep difficulty.  She reports she did enjoy going with a friend to [place] this past Saturday.    Suicidal/Homicidal: Nowithout intent/plan    Therapist Response: reviewed symptoms, discussed stressors, facilitated expression of thoughts and feelings, validated feelings, praised and reinforced patient's efforts to be supportive to her friend, discussed effects, praised and reinforced patient's use of healthy strategies to cope with anniversary of husband's death, reviewed treatment plan, obtained patient's permission to electronically signed plan for patient as this was a virtual visit, discussed next steps for treatment, assisted patient identify ways to improve sleep hygiene, developed plan with patient to schedule Dr. Harrington Challenger and discuss medication  concerns related to sleep  diagnosis: Axis I: MDD, Recurrent, GAD         Collaboration of Care: AEB psychiatrist, therapist encouraged patient to schedule an appointment with psychiatrist Dr. Harrington Challenger for medication  management  Patient/Guardian was advised Release of Information must be obtained prior to any record release in order to collaborate their care with an outside provider. Patient/Guardian was advised if they have not already done so to contact the registration department to sign all necessary forms in order for [place] to release information regarding their care.   Consent: Patient/Guardian gives verbal consent for treatment and assignment of benefits for services provided during this visit. Patient/Guardian expressed understanding and agreed to proceed.

## 2022-04-11 NOTE — Plan of Care (Signed)
  Problem: Depression CCP Problem ruminating thoughts, depressed mood, anxiety  Goal: LTG: Lauren "Maggie" WILL SCORE LESS THAN 10 ON THE PATIENT HEALTH QUESTIONNAIRE (PHQ-9) Outcome: Progressing Note: Pt has made  progress on this goal but needs to be more consistent regarding involvement in activities and socialization. Pt wants to attend two AA meetings per week.  Goal: STG: Lauren "Maggie" WILL IDENTIFY 3 COGNITIVE PATTERNS AND BELIEFS THAT SUPPORT DEPRESSION Outcome: Progressing

## 2022-04-11 NOTE — Plan of Care (Signed)
  Problem: Depression CCP Problem ruminating thoughts, depressed mood, anxiety  Goal: LTG: Lauren "Maggie" WILL SCORE LESS THAN 10 ON THE PATIENT HEALTH QUESTIONNAIRE (PHQ-9) Outcome: Progressing Goal: STG: Alexi "Maggie" WILL IDENTIFY 3 COGNITIVE PATTERNS AND BELIEFS THAT SUPPORT DEPRESSION Outcome: Progressing

## 2022-04-13 ENCOUNTER — Encounter (HOSPITAL_COMMUNITY): Payer: Self-pay | Admitting: Psychiatry

## 2022-04-13 ENCOUNTER — Telehealth (INDEPENDENT_AMBULATORY_CARE_PROVIDER_SITE_OTHER): Payer: Medicare Other | Admitting: Psychiatry

## 2022-04-13 DIAGNOSIS — F5105 Insomnia due to other mental disorder: Secondary | ICD-10-CM

## 2022-04-13 DIAGNOSIS — F411 Generalized anxiety disorder: Secondary | ICD-10-CM | POA: Diagnosis not present

## 2022-04-13 DIAGNOSIS — F332 Major depressive disorder, recurrent severe without psychotic features: Secondary | ICD-10-CM | POA: Diagnosis not present

## 2022-04-13 MED ORDER — TRAZODONE HCL 50 MG PO TABS: 50.0000 mg | ORAL_TABLET | Freq: Every evening | ORAL | 2 refills | Status: DC | PRN

## 2022-04-13 MED ORDER — LAMOTRIGINE 100 MG PO TABS: 100.0000 mg | ORAL_TABLET | Freq: Two times a day (BID) | ORAL | 2 refills | Status: DC

## 2022-04-13 MED ORDER — LORAZEPAM 0.5 MG PO TABS: 0.5000 mg | ORAL_TABLET | Freq: Every day | ORAL | 2 refills | Status: DC

## 2022-04-13 MED ORDER — VENLAFAXINE HCL ER 150 MG PO CP24: 300.0000 mg | ORAL_CAPSULE | Freq: Every day | ORAL | 2 refills | Status: DC

## 2022-04-13 NOTE — Progress Notes (Signed)
Virtual Visit via Telephone Note  I connected with Lauren [name] on 04/13/22 at  1:20 PM EDT by telephone and verified that I am speaking with the correct person using two identifiers.  Location: Patient: home Provider: office   I discussed the limitations, risks, security and privacy concerns of performing an evaluation and management service by telephone and the availability of in person appointments. I also discussed with the patient that there may be a patient responsible charge related to this service. The patient expressed understanding and agreed to proceed.    I discussed the assessment and treatment plan with the patient. The patient was provided an opportunity to ask questions and all were answered. The patient agreed with the plan and demonstrated an understanding of the instructions.   The patient was advised to call back or seek an in-person evaluation if the symptoms worsen or if the condition fails to improve as anticipated.  I provided 15 minutes of non-face-to-face time during this encounter.   Lauren Spiller, MD  [facility] MD/PA/NP OP Progress Note  04/13/2022 1:26 PM [name]  MRN:  [id number]  Chief Complaint:  Chief Complaint  Patient presents with   Depression   Anxiety   Follow-up   HPI: This patient is a 71 year old widowed white female who lives alone in [place]. She has one stepchild. Her step son killed himself 12 years ago.Marland Kitchen He was an alcoholic. The patient is on disability for fibromyalgia  The patient returns for follow-up after 3 months regarding her depression anxiety and insomnia.  She is doing somewhat better but still feels depressed a good deal of the time.  She is still grieving her husband who died last year.  She states some days are good but she has more bad days feeling down than good days.  She is also having trouble sleeping.  When she takes the Ativan she takes feels very groggy the next day.  She would like to try going back to  trazodone.  I also suggested going up on the Effexor XR.  She is to still notes problems with short-term memory.  She had neuropsychological testing last year which did not find any deficits.  She thinks that she has always had these problems her whole life and perhaps has untreated ADD.  I suggest we put this on the back burner for now till we can get her mood improved.  She denies thoughts of self-harm or suicidal ideation Visit Diagnosis:    ICD-10-CM   1. Major depressive disorder, recurrent, severe without psychotic features ([place])  F33.2     2. Generalized anxiety disorder  F41.1     3. Insomnia due to mental disorder  F51.05       Past Psychiatric History: Long history of depression and mood instability.  She was hospitalized about 6 years ago for suicidal ideation  Past Medical History:  Past Medical History:  Diagnosis Date   Anxiety    Chronic pain    COPD (chronic obstructive pulmonary disease) (HCC)    Depression    Fibromyalgia    Fibromyalgia    GERD (gastroesophageal reflux disease)    Headache    Hepatitis C    History of blood transfusion    History of bronchitis    IBS (irritable bowel syndrome)    Osteoarthritis    Pneumonia    Prediabetes    Shortness of breath dyspnea    allergies; increased pain;    Vertigo    Wears glasses  Past Surgical History:  Procedure Laterality Date   BIOPSY  09/16/2020   Procedure: BIOPSY;  Surgeon: Harvel Quale, MD;  [facility];  Service: Gastroenterology;;   CATARACT EXTRACTION W/PHACO Right 08/01/2017   Procedure: CATARACT EXTRACTION PHACO AND INTRAOCULAR LENS PLACEMENT RIGHT EYE;  Surgeon: Rutherford Guys, MD;  Location: [facility];  Service: Ophthalmology;  Laterality: Right;  CDE: 3.30   CATARACT EXTRACTION W/PHACO Left 08/15/2017   Procedure: CATARACT EXTRACTION PHACO AND INTRAOCULAR LENS PLACEMENT (IOC);  Surgeon: Rutherford Guys, MD;  Location: [facility];  Service: Ophthalmology;  Laterality: Left;   CDE: 4.00   CHOLECYSTECTOMY     COLONOSCOPY     COLONOSCOPY N/A 10/23/2014   Procedure: COLONOSCOPY;  Surgeon: Rogene Houston, MD;  [facility];  Service: Endoscopy;  Laterality: N/A;  1030   ESOPHAGOGASTRODUODENOSCOPY N/A 10/23/2014   Procedure: ESOPHAGOGASTRODUODENOSCOPY (EGD);  Surgeon: Rogene Houston, MD;  [facility];  Service: Endoscopy;  Laterality: N/A;   ESOPHAGOGASTRODUODENOSCOPY (EGD) WITH PROPOFOL N/A 09/16/2020   Procedure: ESOPHAGOGASTRODUODENOSCOPY (EGD) WITH PROPOFOL;  Surgeon: Harvel Quale, MD;  [facility];  Service: Gastroenterology;  Laterality: N/A;  7:30   KNEE ARTHROSCOPY Left 10/21/2015   Procedure: ARTHROSCOPY LEFT KNEE WITH MENICAL DEBRIDEMENT, Chondroplasty;  Surgeon: Gaynelle Arabian, MD;  Location: [facility];  Service: Orthopedics;  Laterality: Left;   MANDIBLE SURGERY  09/06/1979   ORIF WRIST FRACTURE Right 07/23/2013   Procedure: OPEN REDUCTION INTERNAL FIXATION (ORIF) WRIST FRACTURE;  Surgeon: Roseanne Kaufman, MD;  Location: [facility];  Service: Orthopedics;  Laterality: Right;   SAVORY DILATION  09/16/2020   Procedure: SAVORY DILATION;  Surgeon: Montez Morita, Quillian Quince, MD;  [facility];  Service: Gastroenterology;;   TONSILLECTOMY     TOTAL KNEE ARTHROPLASTY     2011 rt knee   TOTAL KNEE ARTHROPLASTY Left 04/16/2018   Procedure: LEFT TOTAL KNEE ARTHROPLASTY;  Surgeon: Gaynelle Arabian, MD;  [facility];  Service: Orthopedics;  Laterality: Left;   UPPER GASTROINTESTINAL ENDOSCOPY      Family Psychiatric History: See below  Family History:  Family History  Problem Relation Age of Onset   Alcohol abuse Father    Diabetes Father    Stroke Father    Depression Father    Hypertension Father    Heart attack Father    High Cholesterol Father    Alcohol abuse Maternal Grandfather    Alcohol abuse Maternal Grandmother    Hypertension Maternal Grandmother    Alcohol abuse Paternal Grandfather    Alcohol  abuse Paternal Grandmother    Diabetes Paternal Grandmother    Stroke Mother    Irritable bowel syndrome Mother    Sexual abuse Mother    Hypertension Mother    Breast cancer Mother    Diabetes Brother    Healthy Brother    Diabetes Brother    Heart disease Brother    Sarcoidosis Brother    Heart failure Brother    Anxiety disorder Paternal Uncle    Alcohol abuse Paternal Uncle    Alcohol abuse Cousin    Anxiety disorder Maternal Uncle    ADD / ADHD Neg Hx    Bipolar disorder Neg Hx    Dementia Neg Hx    Drug abuse Neg Hx    Paranoid behavior Neg Hx    Schizophrenia Neg Hx    Seizures Neg Hx    Physical abuse Neg Hx     Social History:  Social History  Socioeconomic History   Marital status: Widowed    Spouse name: Not on file   Number of children: Not on file   Years of education: Not on file   Highest education level: Not on file  Occupational History   Not on file  Tobacco Use   Smoking status: Former    Packs/day: 2.00    Years: 15.00    Total pack years: 30.00    Types: Cigarettes    Quit date: 09/05/1982    Years since quitting: 39.6   Smokeless tobacco: Never  Vaping Use   Vaping Use: Never used  Substance and Sexual Activity   Alcohol use: No    Alcohol/week: 0.0 standard drinks of alcohol    Comment: history of alcoholism; pt states has been sober 28 years   Drug use: No   Sexual activity: Never  Other Topics Concern   Not on file  Social History Narrative   Not on file   Social Determinants of Health   Financial Resource Strain: Not on file  Food Insecurity: Not on file  Transportation Needs: Not on file  Physical Activity: Not on file  Stress: Not on file  Social Connections: Not on file    Allergies:  Allergies  Allergen Reactions   Elavil [Amitriptyline] Other (See Comments)    Vivid dreams and almost suicidal   Flagyl [Metronidazole] Itching   Vistaril [Hydroxyzine Hcl] Other (See Comments)    Got higher than a kite on too much  of this.   Geodon [Ziprasidone Hydrochloride] Other (See Comments)    Blacked out   Lactose Intolerance (Gi) Other (See Comments)    GI upset   Latex Other (See Comments)    Red at site   Other Other (See Comments)    Metabolic Disorder Labs: Lab Results  Component Value Date   HGBA1C 6.1 (H) 10/13/2015   MPG 128 10/13/2015   No results found for: "PROLACTIN" No results found for: "CHOL", "TRIG", "HDL", "CHOLHDL", "VLDL", "LDLCALC" Lab Results  Component Value Date   TSH 2.400 03/19/2014    Therapeutic Level Labs: No results found for: "LITHIUM" No results found for: "VALPROATE" No results found for: "CBMZ"  Current Medications: Current Outpatient Medications  Medication Sig Dispense Refill   LORazepam (ATIVAN) 0.5 MG tablet Take 1 tablet (0.5 mg total) by mouth daily. 30 tablet 2   venlafaxine XR (EFFEXOR-XR) 150 MG 24 hr capsule Take 2 capsules (300 mg total) by mouth daily. 60 capsule 2   albuterol (VENTOLIN HFA) 108 (90 Base) MCG/ACT inhaler INHALE TWO PUFFS BY MOUTH INTO LUNGS EVERY 4 HOURS AS NEEDED FOR SHORTNESS OF BREATH OR wheezing 8.5 g 3   benzonatate (TESSALON) 100 MG capsule Take 1 capsule (100 mg total) by mouth 3 (three) times daily as needed for cough. 21 capsule 0   Biotin w/ Vitamins C & E (HAIR/SKIN/NAILS PO) Take 2 tablets by mouth daily.     calcium-vitamin D (OSCAL WITH D) 500-200 MG-UNIT per tablet Take 1 tablet by mouth 2 (two) times daily. For low calcium     Cholecalciferol (VITAMIN D-3) 25 MCG (1000 UT) CAPS Take 1 capsule by mouth daily.     Cyanocobalamin (B-12) 2000 MCG TABS Take 2,000 mcg by mouth daily.     diclofenac sodium (VOLTAREN) 1 % GEL Apply 2 g topically 4 (four) times daily as needed (For pain.).      dicyclomine (BENTYL) 20 MG tablet Take 20 mg by mouth 4 (four) times daily as  needed.     diphenoxylate-atropine (LOMOTIL) 2.5-0.025 MG tablet TAKE ONE TABLET BY MOUTH THREE TIMES DAILY AS NEEDED 60 tablet 5   DM-APAP-CPM (CORICIDIN  HBP) 10-325-2 MG TABS Take by mouth.     fluticasone (FLONASE) 50 MCG/ACT nasal spray Place 2 sprays into both nostrils in the morning and at bedtime.     Fluticasone-Umeclidin-Vilant (TRELEGY ELLIPTA) 100-62.5-25 MCG/ACT AEPB Inhale 1 puff into the lungs daily. 60 each 0   Fluticasone-Umeclidin-Vilant (TRELEGY ELLIPTA) 100-62.5-25 MCG/ACT AEPB Inhale 1 puff into the lungs daily. 60 each 0   Fluticasone-Umeclidin-Vilant (TRELEGY ELLIPTA) 100-62.5-25 MCG/ACT AEPB Inhale 1 puff into the lungs daily. 60 each 0   gabapentin (NEURONTIN) 100 MG capsule Take 200-300 mg by mouth See admin instructions. Take '200mg'$  by mouth every day in the morning and take '300mg'$  by mouth at bedtime     lamoTRIgine (LAMICTAL) 100 MG tablet Take 1 tablet (100 mg total) by mouth 2 (two) times daily. 180 tablet 2   LORazepam (ATIVAN) 1 MG tablet Take 1 tablet (1 mg total) by mouth at bedtime. 30 tablet 2   losartan (COZAAR) 25 MG tablet Take 25 mg by mouth daily.     metroNIDAZOLE (METROGEL) 1 % gel Apply 1 application topically daily as needed.     montelukast (SINGULAIR) 10 MG tablet TAKE ONE TABLET BY MOUTH EVERYDAY AT BEDTIME 30 tablet 5   Multiple Vitamins-Minerals (MULTIVITAMIN WITH MINERALS) tablet Take 1 tablet daily by mouth.     Omega-3 Fatty Acids (FISH OIL) 1000 MG CPDR Take 1 capsule by mouth daily.     OVER THE COUNTER MEDICATION Take 1 tablet by mouth daily at 8 pm. Calcium, Magnesium, and Zinc once per day.     OVER THE COUNTER MEDICATION Take 1 tablet by mouth daily. Garlic Extract 8295 mg once per day.     OVER THE COUNTER MEDICATION Take 1 tablet by mouth daily. Vitamin D3 25 mcg (1000 IU) once per day.     pantoprazole (PROTONIX) 40 MG tablet Take 40 mg by mouth 2 (two) times daily.     rosuvastatin (CRESTOR) 10 MG tablet Take 10 mg by mouth daily.     traZODone (DESYREL) 50 MG tablet Take 1 tablet (50 mg total) by mouth at bedtime as needed. 30 tablet 2   TRELEGY ELLIPTA 200-62.5-25 MCG/ACT AEPB INHALE 1  PUFF BY MOUTH INTO LUNGS ONCE DAILY 60 each 11   Turmeric 450 MG CAPS Take 450 mg by mouth daily.     venlafaxine XR (EFFEXOR XR) 75 MG 24 hr capsule Take 3 capsules (225 mg total) by mouth daily with breakfast. 270 capsule 2   vitamin E 400 UNIT capsule Take 400 Units daily by mouth.     No current facility-administered medications for this visit.     Musculoskeletal: Strength & Muscle Tone: na Gait & Station: na Patient leans: N/A  Psychiatric Specialty Exam: Review of Systems  Constitutional:  Positive for fatigue.  Musculoskeletal:  Positive for myalgias.  Psychiatric/Behavioral:  Positive for dysphoric mood and sleep disturbance.   All other systems reviewed and are negative.   There were no vitals taken for this visit.There is no height or weight on file to calculate BMI.  General Appearance: NA  Eye Contact:  NA  Speech:  Clear and Coherent  Volume:  Normal  Mood:  Dysphoric and Irritable  Affect:  NA  Thought Process:  Descriptions of Associations: Tangential  Orientation:  Full (Time, Place, and Person)  Thought Content:  Rumination   Suicidal Thoughts:  No  Homicidal Thoughts:  No  Memory:  Immediate;   Fair Recent;   Fair Remote;   Fair  Judgement:  Good  Insight:  Fair  Psychomotor Activity:  Decreased  Concentration:  Concentration: Poor and Attention Span: Poor  Recall:  [place] of Knowledge: Good  Language: Good  Akathisia:  No  Handed:  Right  AIMS (if indicated): not done  Assets:  Communication Skills Desire for Improvement Resilience Social Support Talents/Skills  ADL's:  Intact  Cognition: WNL  Sleep:  Poor   Screenings: AUDIT    Flowsheet Row Admission (Discharged) from 03/18/2014 in [facility]  Alcohol Use Disorder Identification Test Final Score (AUDIT) 0      GAD-7    Flowsheet Row Counselor from 10/21/2020 in [facility] from 06/20/2018 in  [facility]  Total GAD-7 Score 10 16      PHQ2-9    Flowsheet Row Video Visit from 04/13/2022 in [facility] from 04/11/2022 in [facility] from 03/10/2022 in [facility] from 02/24/2022 in [facility] Video Visit from 12/23/2021 in [facility]  PHQ-2 Total Score '3 1 4 5 1  '$ PHQ-9 Total Score '9 9 16 22 '$ --      Flowsheet Row Video Visit from 12/23/2021 in [facility] from 12/01/2021 in [facility] Video Visit from 09/29/2021 in [facility] No Risk No Risk Error: Q3, 4, or 5 should not be populated when Q2 is No        Assessment and Plan: This patient is a 71 year old female with a history of depression anxiety possible bipolar disorder and recent complaints of memory loss.  Since she is more depressed we will increase Effexor XR to 300 mg daily for depression.  She is not sleeping well so we will go back to trazodone 50 mg at bedtime continue Lamictal 100 mg twice daily for mood stabilization.  Since Ativan makes her drowsy we will cut the dosage to 0.5 mg daily as needed for anxiety.  She will return to see me in 6 weeks  Collaboration of Care: Collaboration of Care: Referral or follow-up with counselor/therapist AEB patient will follow-up with therapist Maurice Small in our office  Patient/Guardian was advised Release of Information must be obtained prior to any record release in order to collaborate their care with an outside provider. Patient/Guardian was advised if they have not already done so to contact the registration department to sign all necessary forms  in order for [place] to release information regarding their care.   Consent: Patient/Guardian gives verbal consent for treatment and assignment of benefits for services provided during this visit. Patient/Guardian expressed understanding and agreed to proceed.    Lauren Spiller, MD 04/13/2022, 1:26 PM

## 2022-04-18 DIAGNOSIS — L908 Other atrophic disorders of skin: Secondary | ICD-10-CM | POA: Diagnosis not present

## 2022-04-18 DIAGNOSIS — Z1283 Encounter for screening for malignant neoplasm of skin: Secondary | ICD-10-CM | POA: Diagnosis not present

## 2022-04-18 DIAGNOSIS — D225 Melanocytic nevi of trunk: Secondary | ICD-10-CM | POA: Diagnosis not present

## 2022-04-28 DIAGNOSIS — E7800 Pure hypercholesterolemia, unspecified: Secondary | ICD-10-CM | POA: Diagnosis not present

## 2022-04-28 DIAGNOSIS — I10 Essential (primary) hypertension: Secondary | ICD-10-CM | POA: Diagnosis not present

## 2022-04-28 DIAGNOSIS — E782 Mixed hyperlipidemia: Secondary | ICD-10-CM | POA: Diagnosis not present

## 2022-04-28 DIAGNOSIS — E1169 Type 2 diabetes mellitus with other specified complication: Secondary | ICD-10-CM | POA: Diagnosis not present

## 2022-04-28 DIAGNOSIS — K219 Gastro-esophageal reflux disease without esophagitis: Secondary | ICD-10-CM | POA: Diagnosis not present

## 2022-04-29 ENCOUNTER — Encounter (INDEPENDENT_AMBULATORY_CARE_PROVIDER_SITE_OTHER): Payer: Self-pay | Admitting: *Deleted

## 2022-05-04 ENCOUNTER — Ambulatory Visit (INDEPENDENT_AMBULATORY_CARE_PROVIDER_SITE_OTHER): Payer: Medicare Other | Admitting: Psychiatry

## 2022-05-04 DIAGNOSIS — F411 Generalized anxiety disorder: Secondary | ICD-10-CM | POA: Diagnosis not present

## 2022-05-04 DIAGNOSIS — F332 Major depressive disorder, recurrent severe without psychotic features: Secondary | ICD-10-CM

## 2022-05-04 NOTE — Progress Notes (Signed)
Virtual Visit via Telephone Note  I connected with [name] on 05/04/22 at 1:15 PM EDT  by telephone and verified that I am speaking with the correct person using two identifiers.  Location: Patient: Home Provider: [place] office    I discussed the limitations, risks, security and privacy concerns of performing an evaluation and management service by telephone and the availability of in person appointments. I also discussed with the patient that there may be a patient responsible charge related to this service. The patient expressed understanding and agreed to proceed.    I provided 41 minutes of non-face-to-face time during this encounter.   [name], LCSW               THERAPIST PROGRESS NOTE    Session Time:  Wednesday  05/04/2022 1:15 PM - 1:56 PM   Participation Level: Active  Behavioral Response: CasualAlert/depressed,   Type of Therapy: Individual Therapy  Treatment Goals addressed: Patient will score less than 10 on the patient health questionnaire, patient will identify 3 cognitive patterns and beliefs that support depression  Progress on Goals: Progressing  Interventions: CBT and Supportive  Summary: DENNISE RAABE is a 71 y.o. female who is referred for services due to stress, anxiety, and depression.  She has participated in therapy intermittently for the past 25 years.  She reports 3 psychiatric hospitalizations with the last 1 occurring over 5 years ago at [facility].  Patient continues to deal with grief and loss issues regarding the death of her husband last year.  She also reports ruminations, difficulty completing tasks at home, depressed mood, and irritability.  She reports staying at home more as she has memory difficulty and is embarrassed about this.  She also reports fears that she may have to go to a nursing home should she have dementia.  She is scheduled for a follow-up appointment with psychologist  regarding recent cognitive testing.           Patient last was seen via virtual visit about 4 weeks ago. She states having ups and downs since last session.  She reports stress related to multiple health issues and becoming more frustrated as well as depressed..  She has had GI issues that have decreased her social involvement.  However, she tries to attend meetings and go out with friends when able.  She continues to have negative thoughts about self but is more aware and successfully has been using Socratic questioning to challenge/and replace unhelpful thoughts.  However, she expresses frustration with self that she continues to have negative thoughts.  Patient reports she also has been using mindfulness and says this has been helpful.  Suicidal/Homicidal: Nowithout intent/plan    Therapist Response: reviewed symptoms, discussed stressors, facilitated expression of thoughts and feelings, validated feelings, praised and reinforced patient's efforts to use helpful coping strategies, assisted patient identify realistic expectations of self regarding having negative thoughts, assisted patient identify/challenge/and replace negative thoughts about self/help with more helpful statements, developed plan with patient to read replacement statements daily, also encouraged patient to resume walking within her capability, encouraged patient to continue using mindfulness strategies   diagnosis: Axis I: MDD, Recurrent, GAD         Collaboration of Care: AEB psychiatrist, therapist encouraged patient to schedule an appointment with psychiatrist Dr. Harrington Challenger for medication management  Patient/Guardian was advised Release of Information must be obtained prior to any record release in order to collaborate their care with an outside provider.  Patient/Guardian was advised if they have not already done so to contact the registration department to sign all necessary forms in order for [place] to release information regarding their  care.   Consent: Patient/Guardian gives verbal consent for treatment and assignment of benefits for services provided during this visit. Patient/Guardian expressed understanding and agreed to proceed.

## 2022-05-10 ENCOUNTER — Telehealth (HOSPITAL_COMMUNITY): Payer: Medicare Other | Admitting: Psychiatry

## 2022-05-19 ENCOUNTER — Ambulatory Visit (INDEPENDENT_AMBULATORY_CARE_PROVIDER_SITE_OTHER): Payer: Medicare Other | Admitting: Psychiatry

## 2022-05-19 DIAGNOSIS — F411 Generalized anxiety disorder: Secondary | ICD-10-CM

## 2022-05-19 DIAGNOSIS — F332 Major depressive disorder, recurrent severe without psychotic features: Secondary | ICD-10-CM

## 2022-05-19 NOTE — Progress Notes (Signed)
Virtual Visit via Telephone Note  I connected with [name] on 05/19/22 at 3:10 PM EDT  by telephone and verified that I am speaking with the correct person using two identifiers.  Location: Patient: Home Provider: [place] office    I discussed the limitations, risks, security and privacy concerns of performing an evaluation and management service by telephone and the availability of in person appointments. I also discussed with the patient that there may be a patient responsible charge related to this service. The patient expressed understanding and agreed to proceed.   I provided 46 minutes of non-face-to-face time during this encounter.   [name], LCSW              THERAPIST PROGRESS NOTE    Session Time:  Thursday 05/19/2022 3:10 PM - 3:56 PM   Participation Level: Active  Behavioral Response: CasualAlert/depressed,   Type of Therapy: Individual Therapy  Treatment Goals addressed: Patient will score less than 10 on the patient health questionnaire, patient will identify 3 cognitive patterns and beliefs that support depression  Progress on Goals: Progressing  Interventions: CBT and Supportive  Summary: TYIA BINFORD is a 71 y.o. female who is referred for services due to stress, anxiety, and depression.  She has participated in therapy intermittently for the past 25 years.  She reports 3 psychiatric hospitalizations with the last 1 occurring over 5 years ago at [facility].  Patient continues to deal with grief and loss issues regarding the death of her husband last year.  She also reports ruminations, difficulty completing tasks at home, depressed mood, and irritability.  She reports staying at home more as she has memory difficulty and is embarrassed about this.  She also reports fears that she may have to go to a nursing home should she have dementia.  She is scheduled for a follow-up appointment with psychologist regarding  recent cognitive testing.                 Patient last was seen via virtual visit about 2 weeks ago. She reports decreased intensity and frequency of symptoms of depression as reflected in the PHQ 2 and 9.  She states mood patient reports increased dosage of medication along with being busy have been helpful.  She has been doing household tasks, attending church, attending [employer], enjoying being outside, and helping friends with errands.  She also is looking forward to going to the beach with a friend for about 4 to 5 days.  Patient reports increased awareness of her thoughts and has been using mindfulness as well as Socratic questioning successfully to manage ruminating thoughts.  Patient also has been using helpful replacement statements.  She also has been journaling and reports a better understanding of her childhood history on her thoughts Patient reports becoming much more aware of her compensatory strategies and her core beliefs.   Suicidal/Homicidal: Nowithout intent/plan    Therapist Response: reviewed symptoms, and reinforced patient's increased behavioral activation/socialization, discussed effects, praised and reinforced patient's use of mindfulness skills/Socratic questioning/and journaling, began to provide more psychoeducation on core beliefs, assisted patient identify the effects of her history on her core beliefs as well as her behavior, encouraged patient to maintain consistent efforts using mindfulness skills as well as Socratic questioning, discussed focusing more on core beliefs next session, will send patient handouts via mail in preparation for next session diagnosis: Axis I: MDD, Recurrent, GAD         Collaboration  of Care: AEB psychiatrist, therapist encouraged patient to schedule an appointment with psychiatrist Dr. Harrington Challenger for medication management  Patient/Guardian was advised Release of Information must be obtained prior to any record release in order to collaborate  their care with an outside provider. Patient/Guardian was advised if they have not already done so to contact the registration department to sign all necessary forms in order for [place] to release information regarding their care.   Consent: Patient/Guardian gives verbal consent for treatment and assignment of benefits for services provided during this visit. Patient/Guardian expressed understanding and agreed to proceed.

## 2022-05-21 IMAGING — DX DG CHEST 2V
2 series · 2 of 2 positions shown · non-contrast
Comparison: November 03, 2020.

CLINICAL DATA: Shortness of breath and cough in a 70-year-old
female.

EXAM:
CHEST - 2 VIEW

[chest pa]
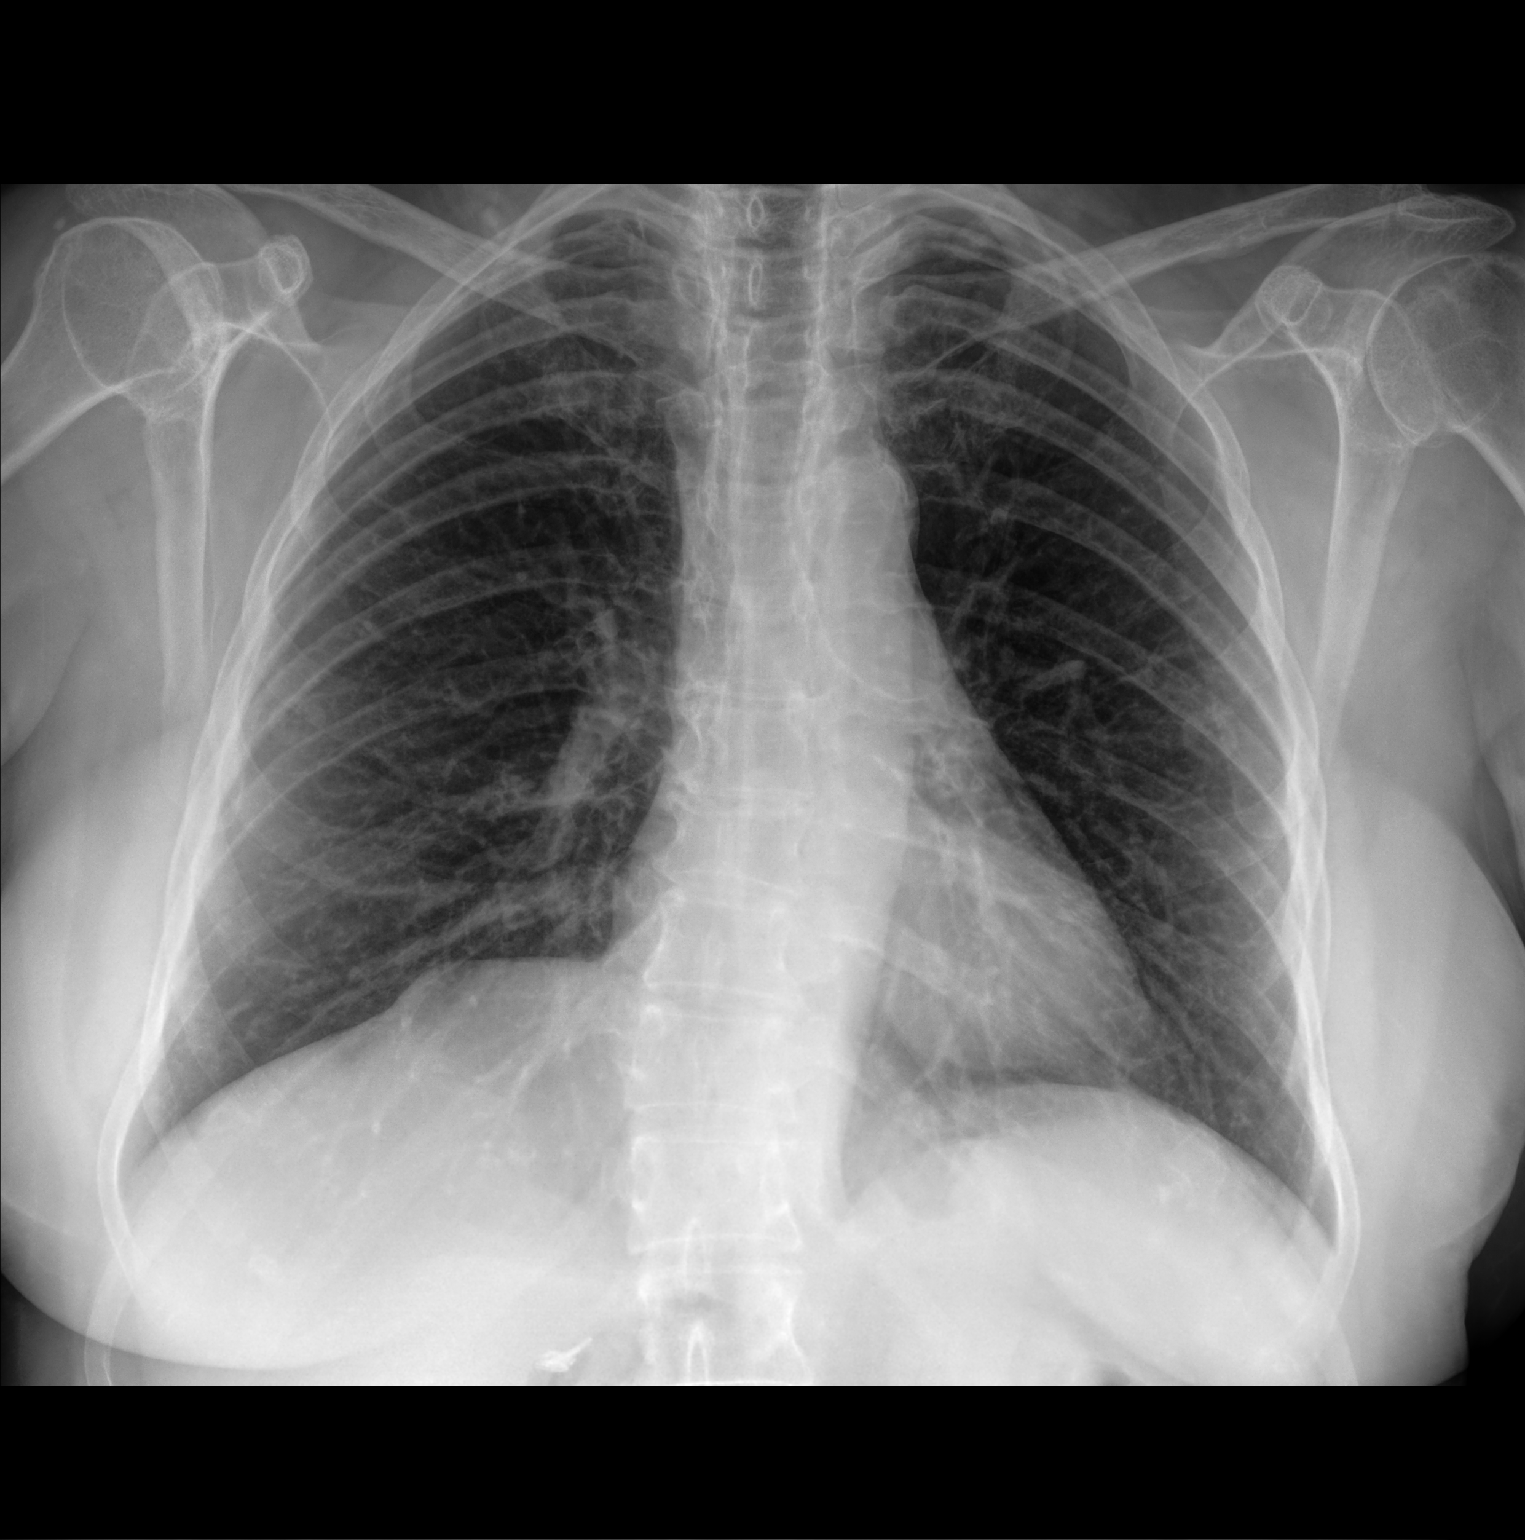

[chest lat]
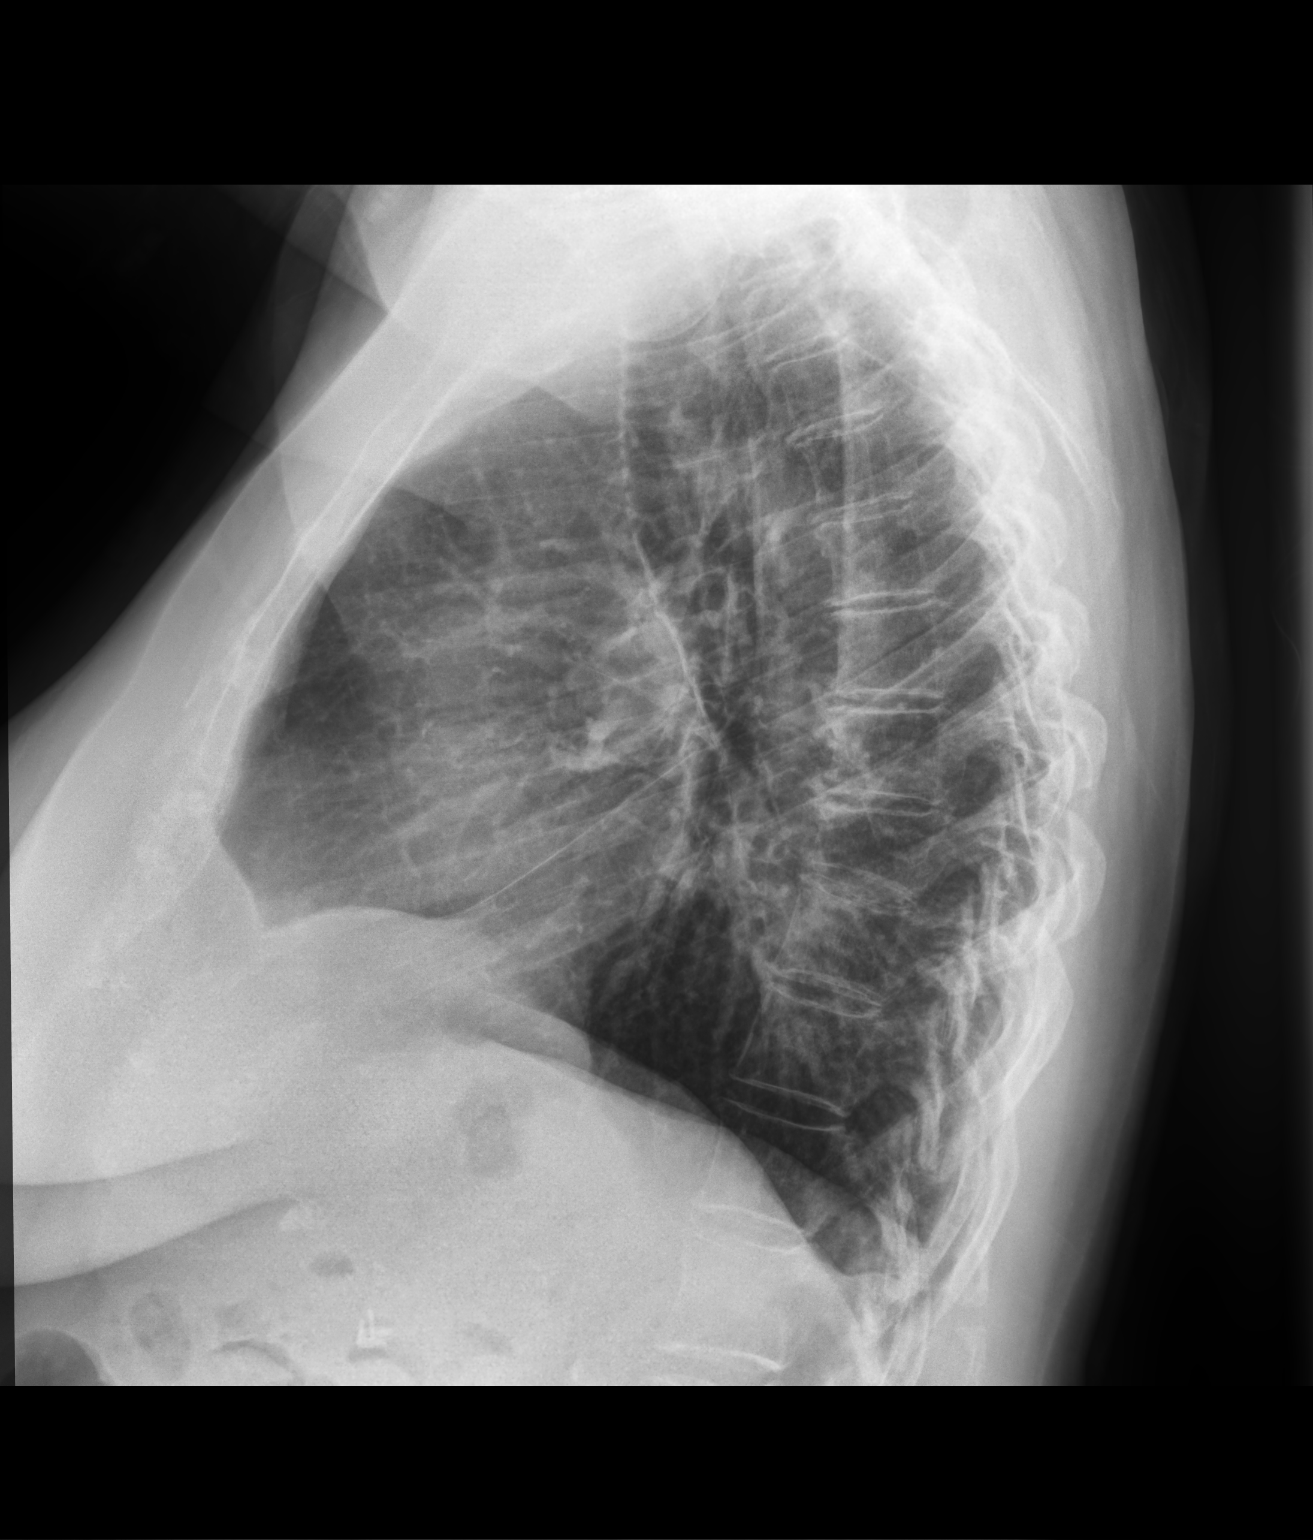

[2 of 2 positions shown; findings below may reference images not displayed]

FINDINGS: The heart size and mediastinal contours are within normal limits.
Both lungs are clear. The visualized skeletal structures are
unremarkable.
IMPRESSION: No active cardiopulmonary disease.

## 2022-05-24 ENCOUNTER — Telehealth (HOSPITAL_COMMUNITY): Payer: Medicare Other | Admitting: Psychiatry

## 2022-05-30 ENCOUNTER — Ambulatory Visit (INDEPENDENT_AMBULATORY_CARE_PROVIDER_SITE_OTHER): Payer: Medicare Other | Admitting: Psychiatry

## 2022-05-30 DIAGNOSIS — F332 Major depressive disorder, recurrent severe without psychotic features: Secondary | ICD-10-CM

## 2022-05-30 NOTE — Progress Notes (Signed)
Virtual Visit via Telephone Note  I connected with Lauren [name] on 05/30/22 at 3:10 PM EDT  by telephone and verified that I am speaking with the correct person using two identifiers.  Location: Patient: Home Provider: [place] office    I discussed the limitations, risks, security and privacy concerns of performing an evaluation and management service by telephone and the availability of in person appointments. I also discussed with the patient that there may be a patient responsible charge related to this service. The patient expressed understanding and agreed to proceed.  I provided 47 minutes of non-face-to-face time during this encounter.   [name], LCSW              THERAPIST PROGRESS NOTE    Session Time:  Monday 05/30/2022 3:10 PM - 3:57 PM   Participation Level: Active  Behavioral Response: CasualAlert/depressed,   Type of Therapy: Individual Therapy  Treatment Goals addressed: Patient will score less than 10 on the patient health questionnaire, patient will identify 3 cognitive patterns and beliefs that support depression  Progress on Goals: Progressing  Interventions: CBT and Supportive  Summary: Lauren [name] is a 71 y.o. female who is referred for services due to stress, anxiety, and depression.  She has participated in therapy intermittently for the past 25 years.  She reports 3 psychiatric hospitalizations with the last 1 occurring over 5 years ago at [facility].  Patient continues to deal with grief and loss issues regarding the death of her husband last year.  She also reports ruminations, difficulty completing tasks at home, depressed mood, and irritability.  She reports staying at home more as she has memory difficulty and is embarrassed about this.  She also reports fears that she may have to go to a nursing home should she have dementia.  She is scheduled for a follow-up appointment with psychologist regarding  recent cognitive testing.                   Patient last was seen via virtual visit about 2 weeks ago. She reports continued decreased intensity and frequency of symptoms of depression .  She reports enjoying recent trip to the beach with a friend.  She reports 1 instance in which she was reluctant to be assertive with her friend as she feared displeasing the friend. Later during the trip,  patient expressed her needs assertively.  She has continued to do well since her return home and maintains involvement in activities.  Patient reports receiving handouts via mail.    Suicidal/Homicidal: Nowithout intent/plan    Therapist Response: reviewed symptoms, praised and reinforced patient's increased behavioral activation/socialization, discussed effects, praised and reinforced patient's efforts to use assertiveness skills, reviewed connection between core beliefs/automatic thoughts/and behavior using recent incident, reviewed psychoeducation on core beliefs and how they are formed, assisted patient identify her core beliefs, discussed rationale for and provided instructions on how to challenge core beliefs using worksheet, developed plan with patient to complete a handout on each of her core beliefs in preparation for next session    diagnosis: Axis I: MDD, Recurrent, GAD         Collaboration of Care: AEB psychiatrist, therapist encouraged patient to schedule an appointment with psychiatrist Dr. Harrington Challenger for medication management  Patient/Guardian was advised Release of Information must be obtained prior to any record release in order to collaborate their care with an outside provider. Patient/Guardian was advised if they have not already done so to  contact the registration department to sign all necessary forms in order for [place] to release information regarding their care.   Consent: Patient/Guardian gives verbal consent for treatment and assignment of benefits for services provided during this visit.  Patient/Guardian expressed understanding and agreed to proceed.

## 2022-06-01 NOTE — Progress Notes (Signed)
Error. Patient canceled appointment.

## 2022-06-03 ENCOUNTER — Encounter: Payer: Medicare Other | Admitting: Physician Assistant

## 2022-06-03 DIAGNOSIS — F32A Depression, unspecified: Secondary | ICD-10-CM

## 2022-06-03 DIAGNOSIS — I10 Essential (primary) hypertension: Secondary | ICD-10-CM

## 2022-06-03 DIAGNOSIS — M79604 Pain in right leg: Secondary | ICD-10-CM

## 2022-06-03 DIAGNOSIS — M797 Fibromyalgia: Secondary | ICD-10-CM

## 2022-06-03 DIAGNOSIS — F419 Anxiety disorder, unspecified: Secondary | ICD-10-CM

## 2022-06-03 DIAGNOSIS — R0600 Dyspnea, unspecified: Secondary | ICD-10-CM

## 2022-06-03 DIAGNOSIS — M7989 Other specified soft tissue disorders: Secondary | ICD-10-CM

## 2022-06-06 ENCOUNTER — Other Ambulatory Visit (INDEPENDENT_AMBULATORY_CARE_PROVIDER_SITE_OTHER): Payer: Self-pay | Admitting: Gastroenterology

## 2022-06-06 DIAGNOSIS — R197 Diarrhea, unspecified: Secondary | ICD-10-CM

## 2022-06-07 ENCOUNTER — Ambulatory Visit: Payer: Medicare Other | Attending: Physician Assistant | Admitting: Physician Assistant

## 2022-06-07 ENCOUNTER — Encounter: Payer: Self-pay | Admitting: Physician Assistant

## 2022-06-07 VITALS — BP 110/68 | HR 93 | Ht 65.0 in | Wt 153.0 lb

## 2022-06-07 DIAGNOSIS — R0789 Other chest pain: Secondary | ICD-10-CM

## 2022-06-07 DIAGNOSIS — M7989 Other specified soft tissue disorders: Secondary | ICD-10-CM

## 2022-06-07 DIAGNOSIS — I10 Essential (primary) hypertension: Secondary | ICD-10-CM | POA: Diagnosis not present

## 2022-06-07 DIAGNOSIS — R0609 Other forms of dyspnea: Secondary | ICD-10-CM

## 2022-06-07 MED ORDER — METOPROLOL TARTRATE 100 MG PO TABS: 100.0000 mg | ORAL_TABLET | Freq: Once | ORAL | 0 refills | Status: DC

## 2022-06-07 NOTE — Patient Instructions (Addendum)
Medication Instructions:  Your physician recommends that you continue on your current medications as directed. Please refer to the Current Medication list given to you today. *If you need a refill on your cardiac medications before your next appointment, please call your pharmacy*   Lab Work: BMET 1 week prior to test If you have labs (blood work) drawn today and your tests are completely normal, you will receive your results only by: [place] (if you have MyChart) OR A paper copy in the mail If you have any lab test that is abnormal or we need to change your treatment, we will call you to review the results.   Testing/Procedures: Coronary CT   Follow-Up: At [facility], you and your health needs are our priority.  As part of our continuing mission to provide you with exceptional heart care, we have created designated Provider Care Teams.  These Care Teams include your primary Cardiologist (physician) and Advanced Practice Providers (APPs -  Physician Assistants and Nurse Practitioners) who all work together to provide you with the care you need, when you need it.  We recommend signing up for the patient portal called "MyChart".  Sign up information is provided on this After Visit Summary.  MyChart is used to connect with patients for Virtual Visits (Telemedicine).  Patients are able to view lab/test results, encounter notes, upcoming appointments, etc.  Non-urgent messages can be sent to your provider as well.   To learn more about what you can do with MyChart, go to [email or web address].    Your next appointment:   3-4 month(s)  The format for your next appointment:   In Person  Provider:   Jenkins Rouge, MD     Other Instructions INSTRUCTIONS FOR CORONARY CTA Please arrive at the [facility] main entrance of [facility] at (30-45 minutes prior to test start time)  [facility] [place], [place] [address] [phone]  Proceed to the [facility] Radiology Department (First Floor).  Please follow these instructions carefully (unless otherwise directed): PLEASE HAVE LABS - BMP  AT LEAST ONE WEEK PRIOR TO TEST  On the Night Before the Test: Drink plenty of water. Do not consume any caffeinated/decaffeinated beverages or chocolate 12 hours prior to your test. Do not take any antihistamines 12 hours prior to your test.  On the Day of the Test: Drink plenty of water. Do not drink any water within one hour of the test. Do not eat any food 4 hours prior to the test. You may take your regular medications prior to the test. Take 100 mg of Lopressor (Metoprolol) one hour before the test.  After the Test: Drink plenty of water. After receiving IV contrast, you may experience a mild flushed feeling. This is normal. On occasion, you may experience a mild rash up to 24 hours after the test. This is not dangerous. If this occurs, you can take Benadryl 25 mg and increase your fluid intake. If you experience trouble breathing, this can be serious. If it is severe call 911 IMMEDIATELY. If it is mild, please call our office.   Important Information About Sugar

## 2022-06-07 NOTE — Progress Notes (Signed)
Cardiology Office Note:    Date:  06/07/2022   ID:  Lauren [name], DOB 04-01-1951, MRN [id number]  PCP:  Lauren Macadam, MD  [name] Cardiologist:  Jenkins Rouge, MD  [facility] Electrophysiologist:  None   Chief Complaint: 12 months follow up   History of Present Illness:    Lauren [name] is a 71 y.o. female with a hx of chronic pain, COPD, anxiety, HTN, HLD GERD, exertional dyspnea seen for follow up.   She had an echocardiogram and Myoview in 2019 which were both normal.  She is on disability for her fibromyalgia.  She sees behavioral health for depression.  Previous EtOH abuse, sober over 26 years.  Patient is here for follow-up.  She has chronic dyspnea with exertion and chest pressure, which had been recently worsened ?Marland Kitchen She is a very poor historian.  Blood pressure normal but varies that its been running high.  She denies orthopnea, PND, syncope or lower extremity edema.  She has chronic varicose veins.  Past Medical History:  Diagnosis Date   Anxiety    Chronic pain    COPD (chronic obstructive pulmonary disease) (HCC)    Depression    Fibromyalgia    Fibromyalgia    GERD (gastroesophageal reflux disease)    Headache    Hepatitis C    History of blood transfusion    History of bronchitis    IBS (irritable bowel syndrome)    Osteoarthritis    Pneumonia    Prediabetes    Shortness of breath dyspnea    allergies; increased pain;    Vertigo    Wears glasses     Past Surgical History:  Procedure Laterality Date   BIOPSY  09/16/2020   Procedure: BIOPSY;  Surgeon: Harvel Quale, MD;  [facility];  Service: Gastroenterology;;   CATARACT EXTRACTION W/PHACO Right 08/01/2017   Procedure: CATARACT EXTRACTION PHACO AND INTRAOCULAR LENS PLACEMENT RIGHT EYE;  Surgeon: Rutherford Guys, MD;  Location: [facility];  Service: Ophthalmology;  Laterality: Right;  CDE: 3.30   CATARACT EXTRACTION W/PHACO Left 08/15/2017   Procedure: CATARACT EXTRACTION  PHACO AND INTRAOCULAR LENS PLACEMENT (IOC);  Surgeon: Rutherford Guys, MD;  Location: [facility];  Service: Ophthalmology;  Laterality: Left;  CDE: 4.00   CHOLECYSTECTOMY     COLONOSCOPY     COLONOSCOPY N/A 10/23/2014   Procedure: COLONOSCOPY;  Surgeon: Rogene Houston, MD;  [facility];  Service: Endoscopy;  Laterality: N/A;  1030   ESOPHAGOGASTRODUODENOSCOPY N/A 10/23/2014   Procedure: ESOPHAGOGASTRODUODENOSCOPY (EGD);  Surgeon: Rogene Houston, MD;  [facility];  Service: Endoscopy;  Laterality: N/A;   ESOPHAGOGASTRODUODENOSCOPY (EGD) WITH PROPOFOL N/A 09/16/2020   Procedure: ESOPHAGOGASTRODUODENOSCOPY (EGD) WITH PROPOFOL;  Surgeon: Harvel Quale, MD;  [facility];  Service: Gastroenterology;  Laterality: N/A;  7:30   KNEE ARTHROSCOPY Left 10/21/2015   Procedure: ARTHROSCOPY LEFT KNEE WITH MENICAL DEBRIDEMENT, Chondroplasty;  Surgeon: Gaynelle Arabian, MD;  Location: [facility];  Service: Orthopedics;  Laterality: Left;   MANDIBLE SURGERY  09/06/1979   ORIF WRIST FRACTURE Right 07/23/2013   Procedure: OPEN REDUCTION INTERNAL FIXATION (ORIF) WRIST FRACTURE;  Surgeon: Roseanne Kaufman, MD;  Location: [place];  Service: Orthopedics;  Laterality: Right;   SAVORY DILATION  09/16/2020   Procedure: SAVORY DILATION;  Surgeon: Montez Morita, Quillian Quince, MD;  Location[facility];  Service: Gastroenterology;;   TONSILLECTOMY     TOTAL KNEE ARTHROPLASTY     2011 rt knee  TOTAL KNEE ARTHROPLASTY Left 04/16/2018   Procedure: LEFT TOTAL KNEE ARTHROPLASTY;  Surgeon: Gaynelle Arabian, MD;  Location: [facility];  Service: Orthopedics;  Laterality: Left;   UPPER GASTROINTESTINAL ENDOSCOPY      Current Medications: Current Meds  Medication Sig   albuterol (VENTOLIN HFA) 108 (90 Base) MCG/ACT inhaler INHALE TWO PUFFS BY MOUTH INTO LUNGS EVERY 4 HOURS AS NEEDED FOR SHORTNESS OF BREATH OR wheezing   Cholecalciferol (VITAMIN D-3) 25 MCG (1000 UT) CAPS Take 1 capsule by mouth daily.    [employer] Omega-3 Krill Oil 350 MG CAPS Take 1 capsule by mouth daily.   Cyanocobalamin (B-12) 2000 MCG TABS Take 2,000 mcg by mouth daily.   diclofenac sodium (VOLTAREN) 1 % GEL Apply 2 g topically 4 (four) times daily as needed (For pain.).    diphenoxylate-atropine (LOMOTIL) 2.5-0.025 MG tablet TAKE ONE TABLET BY MOUTH THREE TIMES DAILY AS NEEDED   fluticasone (FLONASE) 50 MCG/ACT nasal spray Place 2 sprays into both nostrils in the morning and at bedtime.   gabapentin (NEURONTIN) 100 MG capsule Take 200-300 mg by mouth See admin instructions. Take '200mg'$  by mouth every day in the morning and take '300mg'$  by mouth at bedtime   Glucosamine-Chondroitin (GLUCOSAMINE CHONDR COMPLEX PO) Take 6 tablets by mouth daily. Glocosamin '1500mg'$  and chondroitn sulfate '1200mg'$    lamoTRIgine (LAMICTAL) 100 MG tablet Take 1 tablet (100 mg total) by mouth 2 (two) times daily.   LORazepam (ATIVAN) 0.5 MG tablet Take 1 tablet (0.5 mg total) by mouth daily.   losartan (COZAAR) 50 MG tablet Take 50 mg by mouth daily.   metoprolol tartrate (LOPRESSOR) 100 MG tablet Take 1 tablet (100 mg total) by mouth once for 1 dose. 1 hour prior to the test   metroNIDAZOLE (METROGEL) 1 % gel Apply 1 application topically daily as needed.   Misc Natural Products (LUTEIN VISION BLEND PO) Take 1 tablet by mouth daily.   montelukast (SINGULAIR) 10 MG tablet TAKE ONE TABLET BY MOUTH EVERYDAY AT BEDTIME   Multiple Vitamins-Minerals (HAIR SKIN AND NAILS FORMULA) TABS Take 2 tablets by mouth daily.   Omega-3 Fatty Acids (FISH OIL) 1000 MG CPDR Take 1 capsule by mouth daily.   OVER THE COUNTER MEDICATION Take 1 tablet by mouth daily at 8 pm. Calcium, Magnesium, and Zinc once per day.   OVER THE COUNTER MEDICATION Take 1 tablet by mouth daily. Vitamin D3 25 mcg (1000 IU) once per day.   pantoprazole (PROTONIX) 40 MG tablet Take 40 mg by mouth 2 (two) times daily.   rosuvastatin (CRESTOR) 20 MG tablet Take 20 mg by mouth daily.   traZODone (DESYREL) 50  MG tablet Take 1 tablet (50 mg total) by mouth at bedtime as needed.   TRELEGY ELLIPTA 200-62.5-25 MCG/ACT AEPB INHALE 1 PUFF BY MOUTH INTO LUNGS ONCE DAILY   Turmeric (QC TUMERIC COMPLEX) 500 MG CAPS Take 1 capsule by mouth daily.   venlafaxine XR (EFFEXOR-XR) 150 MG 24 hr capsule Take 2 capsules (300 mg total) by mouth daily.   vitamin E 400 UNIT capsule Take 400 Units daily by mouth.     Allergies:   Elavil [amitriptyline], Flagyl [metronidazole], Vistaril [hydroxyzine hcl], Geodon [ziprasidone hydrochloride], Lactose intolerance (gi), Latex, and Other   Social History   Socioeconomic History   Marital status: Widowed    Spouse name: Not on file   Number of children: Not on file   Years of education: Not on file   Highest education level: Not on file  Occupational  History   Not on file  Tobacco Use   Smoking status: Former    Packs/day: 2.00    Years: 15.00    Total pack years: 30.00    Types: Cigarettes    Quit date: 09/05/1982    Years since quitting: 39.7   Smokeless tobacco: Never  Vaping Use   Vaping Use: Never used  Substance and Sexual Activity   Alcohol use: No    Alcohol/week: 0.0 standard drinks of alcohol    Comment: history of alcoholism; pt states has been sober 28 years   Drug use: No   Sexual activity: Never  Other Topics Concern   Not on file  Social History Narrative   Not on file   Social Determinants of Health   Financial Resource Strain: Not on file  Food Insecurity: Not on file  Transportation Needs: Not on file  Physical Activity: Not on file  Stress: Not on file  Social Connections: Not on file     Family History: The patient's family history includes Alcohol abuse in her cousin, father, maternal grandfather, maternal grandmother, paternal grandfather, paternal grandmother, and paternal uncle; Anxiety disorder in her maternal uncle and paternal uncle; Breast cancer in her mother; Depression in her father; Diabetes in her brother, brother,  father, and paternal grandmother; Healthy in her brother; Heart attack in her father; Heart disease in her brother; Heart failure in her brother; High Cholesterol in her father; Hypertension in her father, maternal grandmother, and mother; Irritable bowel syndrome in her mother; Sarcoidosis in her brother; Sexual abuse in her mother; Stroke in her father and mother. There is no history of ADD / ADHD, Bipolar disorder, Dementia, Drug abuse, Paranoid behavior, Schizophrenia, Seizures, or Physical abuse.    ROS:   Please see the history of present illness.    All other systems reviewed and are negative.   EKGs/Labs/Other Studies Reviewed:    The following studies were reviewed today:  Stress test 03/2018 Nuclear stress EF: 70%. The left ventricular ejection fraction is hyperdynamic (>65%). There was no ST segment deviation noted during stress. No T wave inversion was noted during stress. The study is normal. This is a low risk study.   Low risk stress nuclear study with normal perfusion and normal left ventricular regional and global systolic function.  Echo 09/2019  1. Left ventricular ejection fraction, by visual estimation, is 60 to  65%. The left ventricle has normal function. There is mildly increased  left ventricular hypertrophy.   2. Left ventricular diastolic parameters are consistent with Grade I  diastolic dysfunction (impaired relaxation).   3. The left ventricle has no regional wall motion abnormalities.   4. Global right ventricle has normal systolic function.The right  ventricular size is normal. No increase in right ventricular wall  thickness.   5. Left atrial size was mildly dilated.   6. Right atrial size was normal.   7. The mitral valve is grossly normal. No evidence of mitral valve  regurgitation.   8. The tricuspid valve is grossly normal.   9. The tricuspid valve is grossly normal. Tricuspid valve regurgitation  is not demonstrated.  10. The aortic valve is  tricuspid. Aortic valve regurgitation is not  visualized.  11. The pulmonic valve was grossly normal. Pulmonic valve regurgitation is  not visualized.  12. The inferior vena cava is normal in size with greater than 50%  respiratory variability, suggesting right atrial pressure of 3 mmHg.    VAS Lower Ext [place] 11/30/20 Summary:  RIGHT:  - No evidence of deep vein thrombosis in the lower extremity. No indirect  evidence of obstruction proximal to the inguinal ligament.  - No cystic structure found in the popliteal fossa.  - Area of concern shows no abnormal findings.     LEFT:  - No evidence of common femoral vein obstruction.   EKG:  EKG is  ordered today.  The ekg ordered today demonstrates NSR  Recent Labs: No results found for requested labs within last 365 days.  Recent Lipid Panel No results found for: "CHOL", "TRIG", "HDL", "CHOLHDL", "VLDL", "LDLCALC", "LDLDIRECT"  Physical Exam:    VS:  BP 110/68   Pulse 93   Ht '5\' 5"'$  (1.651 m)   Wt 153 lb (69.4 kg)   SpO2 94%   BMI 25.46 kg/m     Wt Readings from Last 3 Encounters:  06/07/22 153 lb (69.4 kg)  05/17/21 165 lb 1.9 oz (74.9 kg)  02/12/21 166 lb 6.4 oz (75.5 kg)     GEN:  Well nourished, well developed in no acute distress HEENT: Normal NECK: No JVD; No carotid bruits LYMPHATICS: No lymphadenopathy CARDIAC: RRR, no murmurs, rubs, gallops RESPIRATORY:  Clear to auscultation without rales, wheezing or rhonchi  ABDOMEN: Soft, non-tender, non-distended MUSCULOSKELETAL:  No edema; No deformity  SKIN: Warm and dry NEUROLOGIC:  Alert and oriented x 3 PSYCHIATRIC:  Normal affect   ASSESSMENT AND PLAN:    Chronic dyspnea on exertion and chest pressure Symptoms may have worsened from prior.  She is very poor historian.  Last work-up was greater than 3 years ago.  After long discussion mutual agreement to proceed with coronary CT.  Could be deconditioning.  2.  Hypertension Blood pressure controlled on  losartan  3.  Varicose vein -Recommended compression stocking  4.  Hyperlipidemia -Continue statin  5.  Asthma -No active wheezing.  On inhaler.  Medication Adjustments/Labs and Tests Ordered: Current medicines are reviewed at length with the patient today.  Concerns regarding medicines are outlined above.  Orders Placed This Encounter  Procedures   CT CORONARY MORPH W/CTA COR W/SCORE W/CA W/CM &/OR WO/CM   Basic Metabolic Panel (BMET)   EKG 12-Lead   Meds ordered this encounter  Medications   metoprolol tartrate (LOPRESSOR) 100 MG tablet    Sig: Take 1 tablet (100 mg total) by mouth once for 1 dose. 1 hour prior to the test    Dispense:  1 tablet    Refill:  0    Patient Instructions  Medication Instructions:  Your physician recommends that you continue on your current medications as directed. Please refer to the Current Medication list given to you today. *If you need a refill on your cardiac medications before your next appointment, please call your pharmacy*   Lab Work: BMET 1 week prior to test If you have labs (blood work) drawn today and your tests are completely normal, you will receive your results only by: [place] (if you have MyChart) OR A paper copy in the mail If you have any lab test that is abnormal or we need to change your treatment, we will call you to review the results.   Testing/Procedures: Coronary CT   Follow-Up: At [facility], you and your health needs are our priority.  As part of our continuing mission to provide you with exceptional heart care, we have created designated Provider Care Teams.  These Care Teams include your primary Cardiologist (physician) and Advanced Practice Providers (APPs -  Physician Assistants and Nurse Practitioners) who all work together to provide you with the care you need, when you need it.  We recommend signing up for the patient portal called "MyChart".  Sign up information is provided on this  After Visit Summary.  MyChart is used to connect with patients for Virtual Visits (Telemedicine).  Patients are able to view lab/test results, encounter notes, upcoming appointments, etc.  Non-urgent messages can be sent to your provider as well.   To learn more about what you can do with MyChart, go to [email or web address].    Your next appointment:   3-4 month(s)  The format for your next appointment:   In Person  Provider:   Jenkins Rouge, MD     Other Instructions INSTRUCTIONS FOR CORONARY CTA Please arrive at the [facility] main entrance of [facility] at (30-45 minutes prior to test start time)  [facility] [place] [address] [phone]  Proceed to the [facility] Radiology Department (First Floor).  Please follow these instructions carefully (unless otherwise directed): PLEASE HAVE LABS - BMP  AT LEAST ONE WEEK PRIOR TO TEST  On the Night Before the Test: Drink plenty of water. Do not consume any caffeinated/decaffeinated beverages or chocolate 12 hours prior to your test. Do not take any antihistamines 12 hours prior to your test.  On the Day of the Test: Drink plenty of water. Do not drink any water within one hour of the test. Do not eat any food 4 hours prior to the test. You may take your regular medications prior to the test. Take 100 mg of Lopressor (Metoprolol) one hour before the test.  After the Test: Drink plenty of water. After receiving IV contrast, you may experience a mild flushed feeling. This is normal. On occasion, you may experience a mild rash up to 24 hours after the test. This is not dangerous. If this occurs, you can take Benadryl 25 mg and increase your fluid intake. If you experience trouble breathing, this can be serious. If it is severe call 911 IMMEDIATELY. If it is mild, please call our office.   Important Information About Sugar         Jarrett Soho, PA  06/07/2022  2:25 PM    [facility]

## 2022-06-10 ENCOUNTER — Other Ambulatory Visit: Payer: Self-pay | Admitting: Pulmonary Disease

## 2022-06-14 ENCOUNTER — Ambulatory Visit (HOSPITAL_COMMUNITY): Payer: Medicare Other | Admitting: Psychiatry

## 2022-06-16 ENCOUNTER — Other Ambulatory Visit: Payer: Medicare Other

## 2022-06-20 ENCOUNTER — Telehealth (HOSPITAL_COMMUNITY): Payer: Self-pay | Admitting: Emergency Medicine

## 2022-06-21 ENCOUNTER — Ambulatory Visit (HOSPITAL_COMMUNITY): Payer: Medicare Other

## 2022-06-22 ENCOUNTER — Ambulatory Visit: Payer: Medicare Other | Attending: Physician Assistant

## 2022-06-22 DIAGNOSIS — R0609 Other forms of dyspnea: Secondary | ICD-10-CM

## 2022-06-22 DIAGNOSIS — R0789 Other chest pain: Secondary | ICD-10-CM | POA: Diagnosis not present

## 2022-06-23 LAB — BASIC METABOLIC PANEL
BUN/Creatinine Ratio: 16 (ref 12–28)
BUN: 13 mg/dL (ref 8–27)
CO2: 23 mmol/L (ref 20–29)
Calcium: 9.4 mg/dL (ref 8.7–10.3)
Chloride: 99 mmol/L (ref 96–106)
Creatinine, Ser: 0.82 mg/dL (ref 0.57–1.00)
Glucose: 87 mg/dL (ref 70–99)
Potassium: 4.8 mmol/L (ref 3.5–5.2)
Sodium: 140 mmol/L (ref 134–144)
eGFR: 76 mL/min/{1.73_m2} (ref >59)

## 2022-06-24 ENCOUNTER — Other Ambulatory Visit: Payer: Self-pay | Admitting: Pulmonary Disease

## 2022-06-27 ENCOUNTER — Ambulatory Visit (INDEPENDENT_AMBULATORY_CARE_PROVIDER_SITE_OTHER): Payer: Medicare Other | Admitting: Gastroenterology

## 2022-06-28 ENCOUNTER — Ambulatory Visit (HOSPITAL_COMMUNITY): Payer: Medicare Other | Admitting: Psychiatry

## 2022-06-29 ENCOUNTER — Ambulatory Visit (INDEPENDENT_AMBULATORY_CARE_PROVIDER_SITE_OTHER): Payer: Medicare Other | Admitting: Psychiatry

## 2022-06-29 DIAGNOSIS — F332 Major depressive disorder, recurrent severe without psychotic features: Secondary | ICD-10-CM

## 2022-06-29 DIAGNOSIS — F411 Generalized anxiety disorder: Secondary | ICD-10-CM

## 2022-06-29 NOTE — Progress Notes (Unsigned)
Virtual Visit via Telephone Note  I connected with Lauren [name] on 06/29/22 at 4:15 PM EDT  by telephone and verified that I am speaking with the correct person using two identifiers.  Location: Patient: Home Provider: [place] office    I discussed the limitations, risks, security and privacy concerns of performing an evaluation and management service by telephone and the availability of in person appointments. I also discussed with the patient that there may be a patient responsible charge related to this service. The patient expressed understanding and agreed to proceed.   .  I provided 45 minutes of non-face-to-face time during this encounter.   [name], LCSW             THERAPIST PROGRESS NOTE    Session Time:  Wednesday  06/29/2022 4:15 PM - 5:00 PM   Participation Level: Active  Behavioral Response: CasualAlert/depressed,   Type of Therapy: Individual Therapy  Treatment Goals addressed: Patient will score less than 10 on the patient health questionnaire, patient will identify 3 cognitive patterns and beliefs that support depression  Progress on Goals: Progressing  Interventions: CBT and Supportive  Summary: Lauren [name] is a 71 y.o. female who is referred for services due to stress, anxiety, and depression.  She has participated in therapy intermittently for the past 25 years.  She reports 3 psychiatric hospitalizations with the last 1 occurring over 5 years ago at [facility].  Patient continues to deal with grief and loss issues regarding the death of her husband last year.  She also reports ruminations, difficulty completing tasks at home, depressed mood, and irritability.  She reports staying at home more as she has memory difficulty and is embarrassed about this.  She also reports fears that she may have to go to a nursing home should she have dementia.  She is scheduled for a follow-up appointment with psychologist  regarding recent cognitive testing.                      Patient last was seen via virtual visit about 4 weeks ago. She reports continued decreased intensity and frequency of symptoms of depression .  She reports increased awareness of thoughts and behaviors and cites examples of successfully replacing negative automatic thoughts with more rational thoughts.  This resulted in patient being more assertive and patient cites a recent example regarding interaction with a friend.  She started completing homework regarding core believes that has some difficulty with the assignment.  Suicidal/Homicidal: Nowithout intent/plan    Therapist Response: reviewed symptoms, praised and reinforced patient's increased awareness of thoughts and behaviors, praised and reinforced patient's use of assertiveness skills, discussed effects, praised and reinforced patient's efforts to complete homework assignment, assisted patient identify and address difficulties in completing assignment, assisted patient practice identifying, challenging, and replacing negative core beliefs using handout, developed plan with patient to complete handouts on other core beliefs in preparation for next session,    diagnosis: Axis I: MDD, Recurrent, GAD         Collaboration of Care: AEB psychiatrist, therapist encouraged patient to schedule an appointment with psychiatrist Dr. Harrington Challenger for medication management  Patient/Guardian was advised Release of Information must be obtained prior to any record release in order to collaborate their care with an outside provider. Patient/Guardian was advised if they have not already done so to contact the registration department to sign all necessary forms in order for [place] to release information regarding their  care.   Consent: Patient/Guardian gives verbal consent for treatment and assignment of benefits for services provided during this visit. Patient/Guardian expressed understanding and agreed to proceed.

## 2022-06-30 ENCOUNTER — Telehealth: Payer: Self-pay | Admitting: *Deleted

## 2022-06-30 ENCOUNTER — Telehealth (HOSPITAL_COMMUNITY): Payer: Self-pay | Admitting: Emergency Medicine

## 2022-06-30 ENCOUNTER — Encounter: Payer: Self-pay | Admitting: Internal Medicine

## 2022-06-30 ENCOUNTER — Ambulatory Visit (INDEPENDENT_AMBULATORY_CARE_PROVIDER_SITE_OTHER): Payer: Medicare Other | Admitting: Internal Medicine

## 2022-06-30 ENCOUNTER — Encounter: Payer: Self-pay | Admitting: *Deleted

## 2022-06-30 VITALS — BP 135/75 | HR 85 | Temp 97.4°F | Ht 66.0 in | Wt 152.4 lb

## 2022-06-30 DIAGNOSIS — K580 Irritable bowel syndrome with diarrhea: Secondary | ICD-10-CM

## 2022-06-30 DIAGNOSIS — R1030 Lower abdominal pain, unspecified: Secondary | ICD-10-CM

## 2022-06-30 DIAGNOSIS — K219 Gastro-esophageal reflux disease without esophagitis: Secondary | ICD-10-CM

## 2022-06-30 DIAGNOSIS — R197 Diarrhea, unspecified: Secondary | ICD-10-CM

## 2022-06-30 MED ORDER — DEXLANSOPRAZOLE 60 MG PO CPDR: 60.0000 mg | DELAYED_RELEASE_CAPSULE | Freq: Every day | ORAL | 11 refills | Status: DC

## 2022-06-30 MED ORDER — DICYCLOMINE HCL 10 MG PO CAPS: 10.0000 mg | ORAL_CAPSULE | Freq: Three times a day (TID) | ORAL | 3 refills | Status: DC

## 2022-06-30 NOTE — Patient Instructions (Signed)
For your chronic reflux, I will switch you to dexlansoprazole 60 mg daily.  You can take famotidine on top of this as needed.  Continue Lomotil as needed for your diarrhea.  I have also started you on dicyclomine 10 mg.  You can take this up to 4 times a day.  I will print you out a low FODMAP diet which may give you insight on certain foods to avoid.  I want you to start taking over-the-counter Benefiber daily.  We will schedule you for colonoscopy to further evaluate your symptoms.  Follow-up after procedure.  It was very nice meeting you today.  Dr. Abbey Chatters

## 2022-06-30 NOTE — Telephone Encounter (Signed)
PA approved via [facility]. Auth# [id number], DOS: Jul 11, 2022 - Sep 04, 2022

## 2022-06-30 NOTE — Telephone Encounter (Signed)
Attempted to call patient regarding upcoming cardiac CT appointment. °Left message on voicemail with name and callback number °Latressa Harries RN Navigator Cardiac Imaging °[facility] °[phone] Office °[phone] Cell ° °

## 2022-06-30 NOTE — H&P (View-Only) (Signed)
Referring Provider: Caren Macadam, MD Primary Care Physician:  Caren Macadam, MD Primary GI:  Dr. Abbey Chatters  Chief Complaint  Patient presents with   Follow-up    Diarrhea, nausea, [name] and IBS    HPI:   Lauren [name] is a 71 y.o. female who presents to clinic today for follow-up visit.  History of chronic GERD, IBS-D, hepatitis C status post treatment with Harvoni.  History of chronic GERD currently maintained on Pantoprazole 40 mg BID and Famotidine. Having breakthrough symptoms. Previously on Nexium and Prilosec without relief.   History of esophageal dysphagia s/p empiric dilation 09/16/2021. This is improved.   Also history of IBS-D, currently on Lomotil. Having breakthrough symptoms.  Does not take Lomotil every day as she is worried about constipation.  Notes abdominal discomfort as well mild to moderate in severity primarily lower.  Last EGD 09/16/2020 with small hiatal hernia, findings suspicious for Barrett's esophagus though biopsies negative, duodenal biopsies negative.  Empiric dilation performed up to 18 mm.  Last Colonoscopy: 2016 - Prep satisfactory. Normal mucosa of cecum, ascending colon, hepatic flexure, transverse colon, splenic flexure, descending and sigmoid colon. Normal rectal mucosa. Small hemorrhoids below the dentate line.  Past Medical History:  Diagnosis Date   Anxiety    Chronic pain    COPD (chronic obstructive pulmonary disease) (HCC)    Depression    Fibromyalgia    Fibromyalgia    GERD (gastroesophageal reflux disease)    Headache    Hepatitis C    History of blood transfusion    History of bronchitis    IBS (irritable bowel syndrome)    Osteoarthritis    Pneumonia    Prediabetes    Shortness of breath dyspnea    allergies; increased pain;    Vertigo    Wears glasses     Past Surgical History:  Procedure Laterality Date   BIOPSY  09/16/2020   Procedure: BIOPSY;  Surgeon: Harvel Quale, MD;  [facility]: [facility];   Service: Gastroenterology;;   CATARACT EXTRACTION W/PHACO Right 08/01/2017   Procedure: CATARACT EXTRACTION PHACO AND INTRAOCULAR LENS PLACEMENT RIGHT EYE;  Surgeon: Rutherford Guys, MD;  Location: [facility];  Service: Ophthalmology;  Laterality: Right;  CDE: 3.30   CATARACT EXTRACTION W/PHACO Left 08/15/2017   Procedure: CATARACT EXTRACTION PHACO AND INTRAOCULAR LENS PLACEMENT (IOC);  Surgeon: Rutherford Guys, MD;  Location: [facility];  Service: Ophthalmology;  Laterality: Left;  CDE: 4.00   CHOLECYSTECTOMY     COLONOSCOPY     COLONOSCOPY N/A 10/23/2014   Procedure: COLONOSCOPY;  Surgeon: Rogene Houston, MD;  [facility]: [facility];  Service: Endoscopy;  Laterality: N/A;  1030   ESOPHAGOGASTRODUODENOSCOPY N/A 10/23/2014   Procedure: ESOPHAGOGASTRODUODENOSCOPY (EGD);  Surgeon: Rogene Houston, MD;  [facility]: [facility];  Service: Endoscopy;  Laterality: N/A;   ESOPHAGOGASTRODUODENOSCOPY (EGD) WITH PROPOFOL N/A 09/16/2020   Procedure: ESOPHAGOGASTRODUODENOSCOPY (EGD) WITH PROPOFOL;  Surgeon: Harvel Quale, MD;  [facility]: [facility];  Service: Gastroenterology;  Laterality: N/A;  7:30   KNEE ARTHROSCOPY Left 10/21/2015   Procedure: ARTHROSCOPY LEFT KNEE WITH MENICAL DEBRIDEMENT, Chondroplasty;  Surgeon: Gaynelle Arabian, MD;  Location: [facility];  Service: Orthopedics;  Laterality: Left;   MANDIBLE SURGERY  09/06/1979   ORIF WRIST FRACTURE Right 07/23/2013   Procedure: OPEN REDUCTION INTERNAL FIXATION (ORIF) WRIST FRACTURE;  Surgeon: Roseanne Kaufman, MD;  Location: [facility];  Service: Orthopedics;  Laterality: Right;   SAVORY DILATION  09/16/2020   Procedure: SAVORY DILATION;  Surgeon: Montez Morita, Quillian Quince, MD;  Location: [facility];  Service: Gastroenterology;;   TONSILLECTOMY     TOTAL KNEE ARTHROPLASTY     2011 rt knee   TOTAL KNEE ARTHROPLASTY Left 04/16/2018   Procedure: LEFT TOTAL KNEE ARTHROPLASTY;  Surgeon: Gaynelle Arabian, MD;  Location: [facility];  Service: Orthopedics;  Laterality:  Left;   UPPER GASTROINTESTINAL ENDOSCOPY      Current Outpatient Medications  Medication Sig Dispense Refill   albuterol (VENTOLIN HFA) 108 (90 Base) MCG/ACT inhaler INHALE TWO PUFFS BY MOUTH INTO LUNGS EVERY 4 HOURS AS NEEDED FOR SHORTNESS OF BREATH OR wheezing 8.5 g 3   Cholecalciferol (VITAMIN D-3) 25 MCG (1000 UT) CAPS Take 1 capsule by mouth daily.     [employer] Omega-3 Krill Oil 350 MG CAPS Take 1 capsule by mouth daily.     Cyanocobalamin (B-12) 2000 MCG TABS Take 2,000 mcg by mouth daily.     diclofenac sodium (VOLTAREN) 1 % GEL Apply 2 g topically 4 (four) times daily as needed (For pain.).      diphenoxylate-atropine (LOMOTIL) 2.5-0.025 MG tablet TAKE ONE TABLET BY MOUTH THREE TIMES DAILY AS NEEDED 60 tablet 1   fluticasone (FLONASE) 50 MCG/ACT nasal spray Place 2 sprays into both nostrils in the morning and at bedtime.     gabapentin (NEURONTIN) 100 MG capsule Take 200-300 mg by mouth See admin instructions. Take '200mg'$  by mouth every day in the morning and take '300mg'$  by mouth at bedtime     Glucosamine-Chondroitin (GLUCOSAMINE CHONDR COMPLEX PO) Take 6 tablets by mouth daily. Glocosamin '1500mg'$  and chondroitn sulfate '1200mg'$      lamoTRIgine (LAMICTAL) 100 MG tablet Take 1 tablet (100 mg total) by mouth 2 (two) times daily. 180 tablet 2   LORazepam (ATIVAN) 0.5 MG tablet Take 1 tablet (0.5 mg total) by mouth daily. 30 tablet 2   losartan (COZAAR) 50 MG tablet Take 50 mg by mouth daily.     metroNIDAZOLE (METROGEL) 1 % gel Apply 1 application topically daily as needed.     montelukast (SINGULAIR) 10 MG tablet TAKE ONE TABLET BY MOUTH EVERYDAY AT BEDTIME 30 tablet 5   Multiple Vitamins-Minerals (HAIR SKIN AND NAILS FORMULA) TABS Take 2 tablets by mouth daily.     Omega-3 Fatty Acids (FISH OIL) 1000 MG CPDR Take 1 capsule by mouth daily.     OVER THE COUNTER MEDICATION Take 1 tablet by mouth daily at 8 pm. Calcium, Magnesium, and Zinc once per day.     OVER THE COUNTER MEDICATION Take 1 tablet  by mouth daily. Vitamin D3 25 mcg (1000 IU) once per day.     pantoprazole (PROTONIX) 40 MG tablet Take 40 mg by mouth 2 (two) times daily.     rosuvastatin (CRESTOR) 20 MG tablet Take 20 mg by mouth daily.     TRELEGY ELLIPTA 200-62.5-25 MCG/ACT AEPB INHALE 1 PUFF BY MOUTH INTO LUNGS ONCE DAILY 60 each 11   Turmeric (QC TUMERIC COMPLEX) 500 MG CAPS Take 1 capsule by mouth daily.     venlafaxine XR (EFFEXOR-XR) 150 MG 24 hr capsule Take 2 capsules (300 mg total) by mouth daily. 60 capsule 2   vitamin E 400 UNIT capsule Take 400 Units daily by mouth.     metoprolol tartrate (LOPRESSOR) 100 MG tablet Take 1 tablet (100 mg total) by mouth once for 1 dose. 1 hour prior to the test 1 tablet 0   Misc Natural Products (LUTEIN VISION BLEND PO) Take 1 tablet by  mouth daily. (Patient not taking: Reported on 06/30/2022)     traZODone (DESYREL) 50 MG tablet Take 1 tablet (50 mg total) by mouth at bedtime as needed. 30 tablet 2   No current facility-administered medications for this visit.    Allergies as of 06/30/2022 - Review Complete 06/30/2022  Allergen Reaction Noted   Elavil [amitriptyline] Other (See Comments) 10/10/2012   Flagyl [metronidazole] Itching 02/14/2012   Vistaril [hydroxyzine hcl] Other (See Comments) 07/25/2012   Geodon [ziprasidone hydrochloride] Other (See Comments) 07/19/2011   Lactose intolerance (gi) Other (See Comments) 07/19/2011   Latex Other (See Comments) 10/12/2015   Other Other (See Comments) 10/19/2020    Family History  Problem Relation Age of Onset   Alcohol abuse Father    Diabetes Father    Stroke Father    Depression Father    Hypertension Father    Heart attack Father    High Cholesterol Father    Alcohol abuse Maternal Grandfather    Alcohol abuse Maternal Grandmother    Hypertension Maternal Grandmother    Alcohol abuse Paternal Grandfather    Alcohol abuse Paternal Grandmother    Diabetes Paternal Grandmother    Stroke Mother    Irritable bowel  syndrome Mother    Sexual abuse Mother    Hypertension Mother    Breast cancer Mother    Diabetes Brother    Healthy Brother    Diabetes Brother    Heart disease Brother    Sarcoidosis Brother    Heart failure Brother    Anxiety disorder Paternal Uncle    Alcohol abuse Paternal Uncle    Alcohol abuse Cousin    Anxiety disorder Maternal Uncle    ADD / ADHD Neg Hx    Bipolar disorder Neg Hx    Dementia Neg Hx    Drug abuse Neg Hx    Paranoid behavior Neg Hx    Schizophrenia Neg Hx    Seizures Neg Hx    Physical abuse Neg Hx     Social History   Socioeconomic History   Marital status: Widowed    Spouse name: Not on file   Number of children: Not on file   Years of education: Not on file   Highest education level: Not on file  Occupational History   Not on file  Tobacco Use   Smoking status: Former    Packs/day: 2.00    Years: 15.00    Total pack years: 30.00    Types: Cigarettes    Quit date: 09/05/1982    Years since quitting: 39.8   Smokeless tobacco: Never  Vaping Use   Vaping Use: Never used  Substance and Sexual Activity   Alcohol use: No    Alcohol/week: 0.0 standard drinks of alcohol    Comment: history of alcoholism; pt states has been sober 28 years   Drug use: No   Sexual activity: Never  Other Topics Concern   Not on file  Social History Narrative   Not on file   Social Determinants of Health   Financial Resource Strain: Not on file  Food Insecurity: Not on file  Transportation Needs: Not on file  Physical Activity: Not on file  Stress: Not on file  Social Connections: Not on file    Subjective: Review of Systems  Constitutional:  Negative for chills and fever.  HENT:  Negative for congestion and hearing loss.   Eyes:  Negative for blurred vision and double vision.  Respiratory:  Negative for cough and  shortness of breath.   Cardiovascular:  Negative for chest pain and palpitations.  Gastrointestinal:  Positive for abdominal pain,  diarrhea and heartburn. Negative for blood in stool, constipation, melena and vomiting.  Genitourinary:  Negative for dysuria and urgency.  Musculoskeletal:  Negative for joint pain and myalgias.  Skin:  Negative for itching and rash.  Neurological:  Negative for dizziness and headaches.  Psychiatric/Behavioral:  Negative for depression. The patient is not nervous/anxious.      Objective: BP 135/75   Pulse 85   Temp (!) 97.4 F (36.3 C)   Ht '5\' 6"'$  (1.676 m)   Wt 152 lb 6.4 oz (69.1 kg)   BMI 24.60 kg/m  Physical Exam Constitutional:      Appearance: Normal appearance.  HENT:     Head: Normocephalic and atraumatic.  Eyes:     Extraocular Movements: Extraocular movements intact.     Conjunctiva/sclera: Conjunctivae normal.  Cardiovascular:     Rate and Rhythm: Normal rate and regular rhythm.     Heart sounds: Murmur heard.  Pulmonary:     Effort: Pulmonary effort is normal.     Breath sounds: Normal breath sounds.  Abdominal:     General: Bowel sounds are normal.     Palpations: Abdomen is soft.  Musculoskeletal:        General: No swelling. Normal range of motion.     Cervical back: Normal range of motion and neck supple.  Skin:    General: Skin is warm and dry.     Coloration: Skin is not jaundiced.  Neurological:     General: No focal deficit present.     Mental Status: She is alert and oriented to person, place, and time.  Psychiatric:        Mood and Affect: Mood normal.        Behavior: Behavior normal.      Assessment: *Chronic GERD-uncontrolled *Irritable bowel syndrome with diarrhea-uncontrolled *Abdominal pain  Plan: For patient's chronic GERD, will trial on Dexilant and see how she does.  If unable to afford, will try Prevacid.  Will schedule for colonoscopy with random colon biopsies.The risks including infection, bleed, or perforation as well as benefits, limitations, alternatives and imponderables have been reviewed with the patient. Questions  have been answered. All parties agreeable.  Continue Lomotil as needed.  We will add dicyclomine to take up to 4 times a day.  Low FODMAP diet printed for patient.  Recommend she start taking over-the-counter Benefiber daily.  Follow-up after procedure. 06/30/2022 9:36 AM   Disclaimer: This note was dictated with voice recognition software. Similar sounding words can inadvertently be transcribed and may not be corrected upon review.

## 2022-06-30 NOTE — Progress Notes (Signed)
Referring Provider: Caren Macadam, MD Primary Care Physician:  Caren Macadam, MD Primary GI:  Dr. Abbey Chatters  Chief Complaint  Patient presents with   Follow-up    Diarrhea, nausea, [name] and IBS    HPI:   Lauren [name] is a 71 y.o. female who presents to clinic today for follow-up visit.  History of chronic GERD, IBS-D, hepatitis C status post treatment with Harvoni.  History of chronic GERD currently maintained on Pantoprazole 40 mg BID and Famotidine. Having breakthrough symptoms. Previously on Nexium and Prilosec without relief.   History of esophageal dysphagia s/p empiric dilation 09/16/2021. This is improved.   Also history of IBS-D, currently on Lomotil. Having breakthrough symptoms.  Does not take Lomotil every day as she is worried about constipation.  Notes abdominal discomfort as well mild to moderate in severity primarily lower.  Last EGD 09/16/2020 with small hiatal hernia, findings suspicious for Barrett's esophagus though biopsies negative, duodenal biopsies negative.  Empiric dilation performed up to 18 mm.  Last Colonoscopy: 2016 - Prep satisfactory. Normal mucosa of cecum, ascending colon, hepatic flexure, transverse colon, splenic flexure, descending and sigmoid colon. Normal rectal mucosa. Small hemorrhoids below the dentate line.  Past Medical History:  Diagnosis Date   Anxiety    Chronic pain    COPD (chronic obstructive pulmonary disease) (HCC)    Depression    Fibromyalgia    Fibromyalgia    GERD (gastroesophageal reflux disease)    Headache    Hepatitis C    History of blood transfusion    History of bronchitis    IBS (irritable bowel syndrome)    Osteoarthritis    Pneumonia    Prediabetes    Shortness of breath dyspnea    allergies; increased pain;    Vertigo    Wears glasses     Past Surgical History:  Procedure Laterality Date   BIOPSY  09/16/2020   Procedure: BIOPSY;  Surgeon: Harvel Quale, MD;  Location: [facility];   Service: Gastroenterology;;   CATARACT EXTRACTION W/PHACO Right 08/01/2017   Procedure: CATARACT EXTRACTION PHACO AND INTRAOCULAR LENS PLACEMENT RIGHT EYE;  Surgeon: Rutherford Guys, MD;  Location: [facility];  Service: Ophthalmology;  Laterality: Right;  CDE: 3.30   CATARACT EXTRACTION W/PHACO Left 08/15/2017   Procedure: CATARACT EXTRACTION PHACO AND INTRAOCULAR LENS PLACEMENT (IOC);  Surgeon: Rutherford Guys, MD;  Location: [facility];  Service: Ophthalmology;  Laterality: Left;  CDE: 4.00   CHOLECYSTECTOMY     COLONOSCOPY     COLONOSCOPY N/A 10/23/2014   Procedure: COLONOSCOPY;  Surgeon: Rogene Houston, MD;  Location: [facility];  Service: Endoscopy;  Laterality: N/A;  1030   ESOPHAGOGASTRODUODENOSCOPY N/A 10/23/2014   Procedure: ESOPHAGOGASTRODUODENOSCOPY (EGD);  Surgeon: Rogene Houston, MD;  Location: [facility];  Service: Endoscopy;  Laterality: N/A;   ESOPHAGOGASTRODUODENOSCOPY (EGD) WITH PROPOFOL N/A 09/16/2020   Procedure: ESOPHAGOGASTRODUODENOSCOPY (EGD) WITH PROPOFOL;  Surgeon: Harvel Quale, MD;  Location: [facility];  Service: Gastroenterology;  Laterality: N/A;  7:30   KNEE ARTHROSCOPY Left 10/21/2015   Procedure: ARTHROSCOPY LEFT KNEE WITH MENICAL DEBRIDEMENT, Chondroplasty;  Surgeon: Gaynelle Arabian, MD;  Location: [facility];  Service: Orthopedics;  Laterality: Left;   MANDIBLE SURGERY  09/06/1979   ORIF WRIST FRACTURE Right 07/23/2013   Procedure: OPEN REDUCTION INTERNAL FIXATION (ORIF) WRIST FRACTURE;  Surgeon: Roseanne Kaufman, MD;  Location: [place];  Service: Orthopedics;  Laterality: Right;   SAVORY DILATION  09/16/2020   Procedure: SAVORY DILATION;  Surgeon: Montez Morita, Quillian Quince, MD;  Location: [facility];  Service: Gastroenterology;;   TONSILLECTOMY     TOTAL KNEE ARTHROPLASTY     2011 rt knee   TOTAL KNEE ARTHROPLASTY Left 04/16/2018   Procedure: LEFT TOTAL KNEE ARTHROPLASTY;  Surgeon: Gaynelle Arabian, MD;  Location: [facility];  Service: Orthopedics;  Laterality:  Left;   UPPER GASTROINTESTINAL ENDOSCOPY      Current Outpatient Medications  Medication Sig Dispense Refill   albuterol (VENTOLIN HFA) 108 (90 Base) MCG/ACT inhaler INHALE TWO PUFFS BY MOUTH INTO LUNGS EVERY 4 HOURS AS NEEDED FOR SHORTNESS OF BREATH OR wheezing 8.5 g 3   Cholecalciferol (VITAMIN D-3) 25 MCG (1000 UT) CAPS Take 1 capsule by mouth daily.     [employer] Omega-3 Krill Oil 350 MG CAPS Take 1 capsule by mouth daily.     Cyanocobalamin (B-12) 2000 MCG TABS Take 2,000 mcg by mouth daily.     diclofenac sodium (VOLTAREN) 1 % GEL Apply 2 g topically 4 (four) times daily as needed (For pain.).      diphenoxylate-atropine (LOMOTIL) 2.5-0.025 MG tablet TAKE ONE TABLET BY MOUTH THREE TIMES DAILY AS NEEDED 60 tablet 1   fluticasone (FLONASE) 50 MCG/ACT nasal spray Place 2 sprays into both nostrils in the morning and at bedtime.     gabapentin (NEURONTIN) 100 MG capsule Take 200-300 mg by mouth See admin instructions. Take '200mg'$  by mouth every day in the morning and take '300mg'$  by mouth at bedtime     Glucosamine-Chondroitin (GLUCOSAMINE CHONDR COMPLEX PO) Take 6 tablets by mouth daily. Glocosamin '1500mg'$  and chondroitn sulfate '1200mg'$      lamoTRIgine (LAMICTAL) 100 MG tablet Take 1 tablet (100 mg total) by mouth 2 (two) times daily. 180 tablet 2   LORazepam (ATIVAN) 0.5 MG tablet Take 1 tablet (0.5 mg total) by mouth daily. 30 tablet 2   losartan (COZAAR) 50 MG tablet Take 50 mg by mouth daily.     metroNIDAZOLE (METROGEL) 1 % gel Apply 1 application topically daily as needed.     montelukast (SINGULAIR) 10 MG tablet TAKE ONE TABLET BY MOUTH EVERYDAY AT BEDTIME 30 tablet 5   Multiple Vitamins-Minerals (HAIR SKIN AND NAILS FORMULA) TABS Take 2 tablets by mouth daily.     Omega-3 Fatty Acids (FISH OIL) 1000 MG CPDR Take 1 capsule by mouth daily.     OVER THE COUNTER MEDICATION Take 1 tablet by mouth daily at 8 pm. Calcium, Magnesium, and Zinc once per day.     OVER THE COUNTER MEDICATION Take 1 tablet  by mouth daily. Vitamin D3 25 mcg (1000 IU) once per day.     pantoprazole (PROTONIX) 40 MG tablet Take 40 mg by mouth 2 (two) times daily.     rosuvastatin (CRESTOR) 20 MG tablet Take 20 mg by mouth daily.     TRELEGY ELLIPTA 200-62.5-25 MCG/ACT AEPB INHALE 1 PUFF BY MOUTH INTO LUNGS ONCE DAILY 60 each 11   Turmeric (QC TUMERIC COMPLEX) 500 MG CAPS Take 1 capsule by mouth daily.     venlafaxine XR (EFFEXOR-XR) 150 MG 24 hr capsule Take 2 capsules (300 mg total) by mouth daily. 60 capsule 2   vitamin E 400 UNIT capsule Take 400 Units daily by mouth.     metoprolol tartrate (LOPRESSOR) 100 MG tablet Take 1 tablet (100 mg total) by mouth once for 1 dose. 1 hour prior to the test 1 tablet 0   Misc Natural Products (LUTEIN VISION BLEND PO) Take 1 tablet by  mouth daily. (Patient not taking: Reported on 06/30/2022)     traZODone (DESYREL) 50 MG tablet Take 1 tablet (50 mg total) by mouth at bedtime as needed. 30 tablet 2   No current facility-administered medications for this visit.    Allergies as of 06/30/2022 - Review Complete 06/30/2022  Allergen Reaction Noted   Elavil [amitriptyline] Other (See Comments) 10/10/2012   Flagyl [metronidazole] Itching 02/14/2012   Vistaril [hydroxyzine hcl] Other (See Comments) 07/25/2012   Geodon [ziprasidone hydrochloride] Other (See Comments) 07/19/2011   Lactose intolerance (gi) Other (See Comments) 07/19/2011   Latex Other (See Comments) 10/12/2015   Other Other (See Comments) 10/19/2020    Family History  Problem Relation Age of Onset   Alcohol abuse Father    Diabetes Father    Stroke Father    Depression Father    Hypertension Father    Heart attack Father    High Cholesterol Father    Alcohol abuse Maternal Grandfather    Alcohol abuse Maternal Grandmother    Hypertension Maternal Grandmother    Alcohol abuse Paternal Grandfather    Alcohol abuse Paternal Grandmother    Diabetes Paternal Grandmother    Stroke Mother    Irritable bowel  syndrome Mother    Sexual abuse Mother    Hypertension Mother    Breast cancer Mother    Diabetes Brother    Healthy Brother    Diabetes Brother    Heart disease Brother    Sarcoidosis Brother    Heart failure Brother    Anxiety disorder Paternal Uncle    Alcohol abuse Paternal Uncle    Alcohol abuse Cousin    Anxiety disorder Maternal Uncle    ADD / ADHD Neg Hx    Bipolar disorder Neg Hx    Dementia Neg Hx    Drug abuse Neg Hx    Paranoid behavior Neg Hx    Schizophrenia Neg Hx    Seizures Neg Hx    Physical abuse Neg Hx     Social History   Socioeconomic History   Marital status: Widowed    Spouse name: Not on file   Number of children: Not on file   Years of education: Not on file   Highest education level: Not on file  Occupational History   Not on file  Tobacco Use   Smoking status: Former    Packs/day: 2.00    Years: 15.00    Total pack years: 30.00    Types: Cigarettes    Quit date: 09/05/1982    Years since quitting: 39.8   Smokeless tobacco: Never  Vaping Use   Vaping Use: Never used  Substance and Sexual Activity   Alcohol use: No    Alcohol/week: 0.0 standard drinks of alcohol    Comment: history of alcoholism; pt states has been sober 28 years   Drug use: No   Sexual activity: Never  Other Topics Concern   Not on file  Social History Narrative   Not on file   Social Determinants of Health   Financial Resource Strain: Not on file  Food Insecurity: Not on file  Transportation Needs: Not on file  Physical Activity: Not on file  Stress: Not on file  Social Connections: Not on file    Subjective: Review of Systems  Constitutional:  Negative for chills and fever.  HENT:  Negative for congestion and hearing loss.   Eyes:  Negative for blurred vision and double vision.  Respiratory:  Negative for cough and  shortness of breath.   Cardiovascular:  Negative for chest pain and palpitations.  Gastrointestinal:  Positive for abdominal pain,  diarrhea and heartburn. Negative for blood in stool, constipation, melena and vomiting.  Genitourinary:  Negative for dysuria and urgency.  Musculoskeletal:  Negative for joint pain and myalgias.  Skin:  Negative for itching and rash.  Neurological:  Negative for dizziness and headaches.  Psychiatric/Behavioral:  Negative for depression. The patient is not nervous/anxious.      Objective: BP 135/75   Pulse 85   Temp (!) 97.4 F (36.3 C)   Ht '5\' 6"'$  (1.676 m)   Wt 152 lb 6.4 oz (69.1 kg)   BMI 24.60 kg/m  Physical Exam Constitutional:      Appearance: Normal appearance.  HENT:     Head: Normocephalic and atraumatic.  Eyes:     Extraocular Movements: Extraocular movements intact.     Conjunctiva/sclera: Conjunctivae normal.  Cardiovascular:     Rate and Rhythm: Normal rate and regular rhythm.     Heart sounds: Murmur heard.  Pulmonary:     Effort: Pulmonary effort is normal.     Breath sounds: Normal breath sounds.  Abdominal:     General: Bowel sounds are normal.     Palpations: Abdomen is soft.  Musculoskeletal:        General: No swelling. Normal range of motion.     Cervical back: Normal range of motion and neck supple.  Skin:    General: Skin is warm and dry.     Coloration: Skin is not jaundiced.  Neurological:     General: No focal deficit present.     Mental Status: She is alert and oriented to person, place, and time.  Psychiatric:        Mood and Affect: Mood normal.        Behavior: Behavior normal.      Assessment: *Chronic GERD-uncontrolled *Irritable bowel syndrome with diarrhea-uncontrolled *Abdominal pain  Plan: For patient's chronic GERD, will trial on Dexilant and see how she does.  If unable to afford, will try Prevacid.  Will schedule for colonoscopy with random colon biopsies.The risks including infection, bleed, or perforation as well as benefits, limitations, alternatives and imponderables have been reviewed with the patient. Questions  have been answered. All parties agreeable.  Continue Lomotil as needed.  We will add dicyclomine to take up to 4 times a day.  Low FODMAP diet printed for patient.  Recommend she start taking over-the-counter Benefiber daily.  Follow-up after procedure. 06/30/2022 9:36 AM   Disclaimer: This note was dictated with voice recognition software. Similar sounding words can inadvertently be transcribed and may not be corrected upon review.

## 2022-07-01 ENCOUNTER — Ambulatory Visit (HOSPITAL_COMMUNITY)
Admission: RE | Admit: 2022-07-01 | Discharge: 2022-07-01 | Disposition: A | Discharge status: Home / Self Care | Payer: Medicare Other | Source: Ambulatory Visit | Attending: Physician Assistant | Admitting: Physician Assistant

## 2022-07-01 DIAGNOSIS — R0609 Other forms of dyspnea: Secondary | ICD-10-CM | POA: Insufficient documentation

## 2022-07-01 DIAGNOSIS — I2510 Atherosclerotic heart disease of native coronary artery without angina pectoris: Secondary | ICD-10-CM | POA: Insufficient documentation

## 2022-07-01 DIAGNOSIS — R0789 Other chest pain: Secondary | ICD-10-CM | POA: Insufficient documentation

## 2022-07-01 MED ORDER — NITROGLYCERIN 0.4 MG SL SUBL
0.8000 mg | SUBLINGUAL_TABLET | Freq: Once | SUBLINGUAL | Status: AC
  Administered 2022-07-01: 0.8 mg via SUBLINGUAL

## 2022-07-01 MED ORDER — IOHEXOL 350 MG/ML SOLN
75.0000 mL | Freq: Once | INTRAVENOUS | Status: AC | PRN
  Administered 2022-07-01: 75 mL via INTRAVENOUS

## 2022-07-01 MED ORDER — NITROGLYCERIN 0.4 MG SL SUBL
SUBLINGUAL_TABLET | SUBLINGUAL | Status: AC
  Filled 2022-07-01: qty 2

## 2022-07-01 MED ORDER — METOPROLOL TARTRATE 5 MG/5ML IV SOLN
INTRAVENOUS | Status: AC
  Administered 2022-07-01: 10 mg via INTRAVENOUS
  Filled 2022-07-01: qty 20

## 2022-07-01 MED ORDER — METOPROLOL TARTRATE 5 MG/5ML IV SOLN: 10.0000 mg | INTRAVENOUS | Status: DC | PRN

## 2022-07-04 ENCOUNTER — Other Ambulatory Visit (HOSPITAL_COMMUNITY): Payer: Self-pay | Admitting: Psychiatry

## 2022-07-04 NOTE — Telephone Encounter (Signed)
Call for appt

## 2022-07-06 ENCOUNTER — Encounter (HOSPITAL_COMMUNITY): Payer: Self-pay

## 2022-07-06 ENCOUNTER — Other Ambulatory Visit: Payer: Self-pay

## 2022-07-06 ENCOUNTER — Encounter (HOSPITAL_COMMUNITY)
Admission: RE | Admit: 2022-07-06 | Discharge: 2022-07-06 | Disposition: A | Discharge status: Home / Self Care | Payer: Medicare Other | Source: Ambulatory Visit | Attending: Internal Medicine | Admitting: Internal Medicine

## 2022-07-06 DIAGNOSIS — I10 Essential (primary) hypertension: Secondary | ICD-10-CM | POA: Diagnosis not present

## 2022-07-06 DIAGNOSIS — R040 Epistaxis: Secondary | ICD-10-CM | POA: Diagnosis not present

## 2022-07-06 HISTORY — DX: Type 2 diabetes mellitus without complications: E11.9

## 2022-07-11 ENCOUNTER — Ambulatory Visit (HOSPITAL_COMMUNITY): Payer: Medicare Other | Admitting: Anesthesiology

## 2022-07-11 ENCOUNTER — Encounter (HOSPITAL_COMMUNITY)
Admission: RE | Disposition: A | Discharge status: Home / Self Care | Payer: Self-pay | Source: Home / Self Care | Attending: Internal Medicine

## 2022-07-11 ENCOUNTER — Ambulatory Visit (HOSPITAL_COMMUNITY)
Admission: RE | Admit: 2022-07-11 | Discharge: 2022-07-11 | Disposition: A | Discharge status: Home / Self Care | Payer: Medicare Other | Attending: Internal Medicine | Admitting: Internal Medicine

## 2022-07-11 ENCOUNTER — Encounter (HOSPITAL_COMMUNITY): Payer: Self-pay

## 2022-07-11 ENCOUNTER — Other Ambulatory Visit: Payer: Self-pay

## 2022-07-11 ENCOUNTER — Ambulatory Visit (HOSPITAL_BASED_OUTPATIENT_CLINIC_OR_DEPARTMENT_OTHER): Payer: Medicare Other | Admitting: Anesthesiology

## 2022-07-11 DIAGNOSIS — K589 Irritable bowel syndrome without diarrhea: Secondary | ICD-10-CM

## 2022-07-11 DIAGNOSIS — K529 Noninfective gastroenteritis and colitis, unspecified: Secondary | ICD-10-CM | POA: Insufficient documentation

## 2022-07-11 DIAGNOSIS — K219 Gastro-esophageal reflux disease without esophagitis: Secondary | ICD-10-CM | POA: Insufficient documentation

## 2022-07-11 DIAGNOSIS — K5730 Diverticulosis of large intestine without perforation or abscess without bleeding: Secondary | ICD-10-CM

## 2022-07-11 DIAGNOSIS — D120 Benign neoplasm of cecum: Secondary | ICD-10-CM | POA: Diagnosis not present

## 2022-07-11 DIAGNOSIS — Z8719 Personal history of other diseases of the digestive system: Secondary | ICD-10-CM | POA: Diagnosis not present

## 2022-07-11 DIAGNOSIS — E119 Type 2 diabetes mellitus without complications: Secondary | ICD-10-CM | POA: Insufficient documentation

## 2022-07-11 DIAGNOSIS — Z79899 Other long term (current) drug therapy: Secondary | ICD-10-CM | POA: Diagnosis not present

## 2022-07-11 DIAGNOSIS — K648 Other hemorrhoids: Secondary | ICD-10-CM | POA: Diagnosis not present

## 2022-07-11 DIAGNOSIS — Z87891 Personal history of nicotine dependence: Secondary | ICD-10-CM | POA: Diagnosis not present

## 2022-07-11 DIAGNOSIS — Z7984 Long term (current) use of oral hypoglycemic drugs: Secondary | ICD-10-CM | POA: Insufficient documentation

## 2022-07-11 DIAGNOSIS — J449 Chronic obstructive pulmonary disease, unspecified: Secondary | ICD-10-CM | POA: Insufficient documentation

## 2022-07-11 HISTORY — PX: BIOPSY: SHX5522

## 2022-07-11 HISTORY — PX: COLONOSCOPY WITH PROPOFOL: SHX5780

## 2022-07-11 LAB — GLUCOSE, CAPILLARY: Glucose-Capillary: 114 mg/dL — ABNORMAL HIGH (ref 70–99)

## 2022-07-11 SURGERY — COLONOSCOPY WITH PROPOFOL
Anesthesia: General

## 2022-07-11 MED ORDER — LIDOCAINE HCL (CARDIAC) PF 100 MG/5ML IV SOSY
PREFILLED_SYRINGE | INTRAVENOUS | Status: DC | PRN
  Administered 2022-07-11: 50 mg via INTRAVENOUS

## 2022-07-11 MED ORDER — PROPOFOL 500 MG/50ML IV EMUL
INTRAVENOUS | Status: DC | PRN
  Administered 2022-07-11: 150 ug/kg/min via INTRAVENOUS

## 2022-07-11 MED ORDER — LACTATED RINGERS IV SOLN: INTRAVENOUS | Status: DC

## 2022-07-11 MED ORDER — PROPOFOL 10 MG/ML IV BOLUS
INTRAVENOUS | Status: DC | PRN
  Administered 2022-07-11: 50 mg via INTRAVENOUS

## 2022-07-11 NOTE — Anesthesia Preprocedure Evaluation (Addendum)
Anesthesia Evaluation  Patient identified by MRN, date of birth, ID band Patient awake    Reviewed: Allergy & Precautions, NPO status , Patient's Chart, lab work & pertinent test results  Airway Mallampati: II  TM Distance: >3 FB Neck ROM: Full    Dental  (+) Dental Advisory Given No notable dental injury:   Pulmonary shortness of breath and with exertion, pneumonia, COPD,  COPD inhaler, former smoker   Pulmonary exam normal breath sounds clear to auscultation       Cardiovascular Exercise Tolerance: Good Normal cardiovascular exam Rhythm:Regular Rate:Normal  1. Left ventricular ejection fraction, by visual estimation, is 60 to  65%. The left ventricle has normal function. There is mildly increased  left ventricular hypertrophy.   2. Left ventricular diastolic parameters are consistent with Grade I  diastolic dysfunction (impaired relaxation).   3. The left ventricle has no regional wall motion abnormalities.   4. Global right ventricle has normal systolic function.The right  ventricular size is normal. No increase in right ventricular wall  thickness.   5. Left atrial size was mildly dilated.   6. Right atrial size was normal.   7. The mitral valve is grossly normal. No evidence of mitral valve  regurgitation.   8. The tricuspid valve is grossly normal.   9. The tricuspid valve is grossly normal. Tricuspid valve regurgitation  is not demonstrated.  10. The aortic valve is tricuspid. Aortic valve regurgitation is not  visualized.  11. The pulmonic valve was grossly normal. Pulmonic valve regurgitation is  not visualized.  12. The inferior vena cava is normal in size with greater than 50%  respiratory variability, suggesting right atrial pressure of 3 mmHg.     Neuro/Psych  Headaches PSYCHIATRIC DISORDERS Anxiety Depression     Neuromuscular disease    GI/Hepatic ,GERD  Medicated and Controlled,,(+) Hepatitis -, C   Endo/Other  diabetes, Well Controlled, Type 2, Oral Hypoglycemic Agents    Renal/GU Renal InsufficiencyRenal disease     Musculoskeletal  (+) Arthritis , Osteoarthritis,  Fibromyalgia -  Abdominal   Peds  Hematology   Anesthesia Other Findings   Reproductive/Obstetrics                             Anesthesia Physical Anesthesia Plan  ASA: 3  Anesthesia Plan: General   Post-op Pain Management: Minimal or no pain anticipated   Induction: Intravenous  PONV Risk Score and Plan: Propofol infusion  Airway Management Planned: Nasal Cannula and Natural Airway  Additional Equipment:   Intra-op Plan:   Post-operative Plan:   Informed Consent: I have reviewed the patients History and Physical, chart, labs and discussed the procedure including the risks, benefits and alternatives for the proposed anesthesia with the patient or authorized representative who has indicated his/her understanding and acceptance.     Dental advisory given  Plan Discussed with: CRNA and Surgeon  Anesthesia Plan Comments:         Anesthesia Quick Evaluation

## 2022-07-11 NOTE — Discharge Instructions (Addendum)
  Colonoscopy Discharge Instructions  Read the instructions outlined below and refer to this sheet in the next few weeks. These discharge instructions provide you with general information on caring for yourself after you leave the hospital. Your doctor may also give you specific instructions. While your treatment has been planned according to the most current medical practices available, unavoidable complications occasionally occur.   ACTIVITY You may resume your regular activity, but move at a slower pace for the next 24 hours.  Take frequent rest periods for the next 24 hours.  Walking will help get rid of the air and reduce the bloated feeling in your belly (abdomen).  No driving for 24 hours (because of the medicine (anesthesia) used during the test).   Do not sign any important legal documents or operate any machinery for 24 hours (because of the anesthesia used during the test).  NUTRITION Drink plenty of fluids.  You may resume your normal diet as instructed by your doctor.  Begin with a light meal and progress to your normal diet. Heavy or fried foods are harder to digest and may make you feel sick to your stomach (nauseated).  Avoid alcoholic beverages for 24 hours or as instructed.  MEDICATIONS You may resume your normal medications unless your doctor tells you otherwise.  WHAT YOU CAN EXPECT TODAY Some feelings of bloating in the abdomen.  Passage of more gas than usual.  Spotting of blood in your stool or on the toilet paper.  IF YOU HAD POLYPS REMOVED DURING THE COLONOSCOPY: No aspirin products for 7 days or as instructed.  No alcohol for 7 days or as instructed.  Eat a soft diet for the next 24 hours.  FINDING OUT THE RESULTS OF YOUR TEST Not all test results are available during your visit. If your test results are not back during the visit, make an appointment with your caregiver to find out the results. Do not assume everything is normal if you have not heard from your  caregiver or the medical facility. It is important for you to follow up on all of your test results.  SEEK IMMEDIATE MEDICAL ATTENTION IF: You have more than a spotting of blood in your stool.  Your belly is swollen (abdominal distention).  You are nauseated or vomiting.  You have a temperature over 101.  You have abdominal pain or discomfort that is severe or gets worse throughout the day.   Your colonoscopy revealed a large polyp on the right side your colon.  This required in depth resection.  I did successfully remove it.  Await pathology results, office will contact you.  Also took samples of your colon to rule out a condition called microscopic colitis which can cause chronically loose stools especially in women.  Repeat colonoscopy 3 years.  I want you to start dicyclomine when you get it.  Trial Dexilant for your chronic GERD.  Follow-up with GI in 3 months.  I hope you have a great rest of your week!  Elon Alas. Abbey Chatters, D.O. Gastroenterology and Hepatology [facility]

## 2022-07-11 NOTE — Anesthesia Postprocedure Evaluation (Signed)
Anesthesia Post Note  Patient: Lauren [name]  Procedure(s) Performed: COLONOSCOPY WITH PROPOFOL BIOPSY  Patient location during evaluation: Phase II Anesthesia Type: General Level of consciousness: awake and alert and oriented Pain management: pain level controlled Vital Signs Assessment: post-procedure vital signs reviewed and stable Respiratory status: spontaneous breathing, nonlabored ventilation and respiratory function stable Cardiovascular status: blood pressure returned to baseline and stable Postop Assessment: no apparent nausea or vomiting Anesthetic complications: no  No notable events documented.   Last Vitals:  Vitals:   07/11/22 1243 07/11/22 1418  BP: (!) 149/79 133/71  Pulse: 85 90  Resp: 16 18  Temp: 36.7 C 36.4 C  SpO2: 99% 96%    Last Pain:  Vitals:   07/11/22 1418  TempSrc: Oral  PainSc: 0-No pain                 Aleesia Henney C Myrtha Tonkovich

## 2022-07-11 NOTE — Op Note (Signed)
[facility] Patient Name: Lauren [name] Procedure Date: 07/11/2022 1:25 PM MRN: [id number] Date of Birth: 05/18/1951 Attending MD: Elon Alas. Abbey Chatters , [place], [id number] CSN: [id number] Age: 71 Admit Type: Outpatient Procedure:                Colonoscopy Indications:              Chronic diarrhea Providers:                Elon Alas. Abbey Chatters, DO, Janeece Riggers, RN, Kristine L.                            Risa Grill, Technician Referring MD:              Medicines:                See the Anesthesia note for documentation of the                            administered medications Complications:            No immediate complications. Estimated Blood Loss:     Estimated blood loss was minimal. Procedure:                Pre-Anesthesia Assessment:                           - The anesthesia plan was to use monitored                            anesthesia care (MAC).                           After obtaining informed consent, the colonoscope                            was passed under direct vision. Throughout the                            procedure, the patient's blood pressure, pulse, and                            oxygen saturations were monitored continuously. The                            [id number]-HQ190L ([id number]) scope was introduced through                            the anus and advanced to the the cecum, identified                            by appendiceal orifice and ileocecal valve. The                            patient tolerated the procedure well. The quality                            of the bowel preparation was  evaluated using the                            BBPS [facility] Bowel Preparation Scale) with scores                            of: Right Colon = 3, Transverse Colon = 3 and Left                            Colon = 3 (entire mucosa seen well with no residual                            staining, small fragments of stool or opaque                            liquid). The total BBPS  score equals 9. The                            colonoscopy was technically difficult and complex                            due to significant looping and a tortuous colon. Scope In: 1:39:39 PM Scope Out: 2:12:40 PM Scope Withdrawal Time: 0 hours 21 minutes 26 seconds  Total Procedure Duration: 0 hours 33 minutes 1 second  Findings:      The perianal and digital rectal examinations were normal.      Non-bleeding internal hemorrhoids were found during endoscopy.      Multiple large-mouthed and small-mouthed diverticula were found in the       sigmoid colon.      A 15 mm polyp was found in the cecum. The polyp was flat. Preparations       were made for mucosal resection. Demarcation of the lesion was performed       with narrow band imaging to clearly identify the boundaries of the       lesion. Eleview was injected with adequate lift of the lesion from the       muscularis propria. Snare mucosal resection was performed. Resection and       retrieval were complete. Resected tissue margins were examined and clear       of polyp tissue. There was no bleeding at the end of the procedure. To       close a defect after mucosal resection, two hemostatic clips were       successfully placed (MR conditional). Clip manufacturer: [employer]. There was no bleeding at the end of the procedure.      Biopsies for histology were taken with a cold forceps from the ascending       colon, transverse colon and descending colon for evaluation of       microscopic colitis. Impression:               - Non-bleeding internal hemorrhoids.                           - Diverticulosis in the sigmoid colon.                           -  One 15 mm polyp in the cecum, removed with                            mucosal resection. Resected and retrieved. Clips                            (MR conditional) were placed. Clip manufacturer:                            [employer].                           -  Mucosal resection was performed. Resection and                            retrieval were complete.                           - Biopsies were taken with a cold forceps from the                            ascending colon, transverse colon and descending                            colon for evaluation of microscopic colitis. Moderate Sedation:      Per Anesthesia Care Recommendation:           - Patient has a contact number available for                            emergencies. The signs and symptoms of potential                            delayed complications were discussed with the                            patient. Return to normal activities tomorrow.                            Written discharge instructions were provided to the                            patient.                           - Resume previous diet.                           - Continue present medications.                           - Await pathology results.                           - Repeat colonoscopy in 3 years for surveillance.                           -  Return to GI clinic in 3 months. Procedure Code(s):        --- Professional ---                           [phone], Colonoscopy, flexible; with endoscopic                            mucosal resection                           45380, 37, Colonoscopy, flexible; with biopsy,                            single or multiple Diagnosis Code(s):        --- Professional ---                           K64.8, Other hemorrhoids                           D12.0, Benign neoplasm of cecum                           K52.9, Noninfective gastroenteritis and colitis,                            unspecified                           K57.30, Diverticulosis of large intestine without                            perforation or abscess without bleeding CPT copyright 2022 American Medical Association. All rights reserved. The codes documented in this report are preliminary and upon coder review may   be revised to meet current compliance requirements. Elon Alas. Abbey Chatters, DO [place] Abbey Chatters, DO 07/11/2022 2:18:32 PM This report has been signed electronically. Number of Addenda: 0

## 2022-07-11 NOTE — Transfer of Care (Signed)
Immediate Anesthesia Transfer of Care Note  Patient: Lauren [name]  Procedure(s) Performed: COLONOSCOPY WITH PROPOFOL BIOPSY  Patient Location: Short Stay  Anesthesia Type:General  Level of Consciousness: awake, alert , oriented, and patient cooperative  Airway & Oxygen Therapy: Patient Spontanous Breathing  Post-op Assessment: Report given to RN, Post -op Vital signs reviewed and stable, and Patient moving all extremities X 4  Post vital signs: Reviewed and stable  Last Vitals:  Vitals Value Taken Time  BP 133/71 07/11/22 1418  Temp 36.4 C 07/11/22 1418  Pulse 90 07/11/22 1418  Resp 18 07/11/22 1418  SpO2 96 % 07/11/22 1418    Last Pain:  Vitals:   07/11/22 1418  TempSrc: Oral  PainSc: 0-No pain         Complications: No notable events documented.

## 2022-07-11 NOTE — Interval H&P Note (Signed)
History and Physical Interval Note:  07/11/2022 1:27 PM  Lauren [name]  has presented today for surgery, with the diagnosis of IBS-D.  The various methods of treatment have been discussed with the patient and family. After consideration of risks, benefits and other options for treatment, the patient has consented to  Procedure(s) with comments: COLONOSCOPY WITH PROPOFOL (N/A) - 2:30pm, asa 3 as a surgical intervention.  The patient's history has been reviewed, patient examined, no change in status, stable for surgery.  I have reviewed the patient's chart and labs.  Questions were answered to the patient's satisfaction.     [name]

## 2022-07-13 ENCOUNTER — Ambulatory Visit (HOSPITAL_COMMUNITY): Payer: Medicare Other | Admitting: Psychiatry

## 2022-07-13 LAB — SURGICAL PATHOLOGY

## 2022-07-18 ENCOUNTER — Telehealth (HOSPITAL_COMMUNITY): Payer: Self-pay

## 2022-07-18 ENCOUNTER — Encounter (HOSPITAL_COMMUNITY): Payer: Self-pay | Admitting: Internal Medicine

## 2022-07-18 NOTE — Telephone Encounter (Signed)
Effexor Refill Request - [place], [place]. Next appt 04/19/22

## 2022-07-20 ENCOUNTER — Telehealth (INDEPENDENT_AMBULATORY_CARE_PROVIDER_SITE_OTHER): Payer: Medicare Other | Admitting: Psychiatry

## 2022-07-20 ENCOUNTER — Encounter (HOSPITAL_COMMUNITY): Payer: Self-pay | Admitting: Psychiatry

## 2022-07-20 DIAGNOSIS — F411 Generalized anxiety disorder: Secondary | ICD-10-CM

## 2022-07-20 DIAGNOSIS — F5105 Insomnia due to other mental disorder: Secondary | ICD-10-CM | POA: Diagnosis not present

## 2022-07-20 DIAGNOSIS — F332 Major depressive disorder, recurrent severe without psychotic features: Secondary | ICD-10-CM | POA: Diagnosis not present

## 2022-07-20 MED ORDER — LAMOTRIGINE 100 MG PO TABS: 100.0000 mg | ORAL_TABLET | Freq: Two times a day (BID) | ORAL | 2 refills | Status: DC

## 2022-07-20 MED ORDER — VENLAFAXINE HCL ER 150 MG PO CP24: ORAL_CAPSULE | ORAL | 2 refills | Status: DC

## 2022-07-20 MED ORDER — LORAZEPAM 0.5 MG PO TABS: 0.5000 mg | ORAL_TABLET | Freq: Every day | ORAL | 2 refills | Status: DC

## 2022-07-20 NOTE — Progress Notes (Signed)
Virtual Visit via Telephone Note  I connected with Lauren [name] on 07/20/22 at  1:20 PM EST by telephone and verified that I am speaking with the correct person using two identifiers.  Location: Patient: home Provider: office   I discussed the limitations, risks, security and privacy concerns of performing an evaluation and management service by telephone and the availability of in person appointments. I also discussed with the patient that there may be a patient responsible charge related to this service. The patient expressed understanding and agreed to proceed.      I discussed the assessment and treatment plan with the patient. The patient was provided an opportunity to ask questions and all were answered. The patient agreed with the plan and demonstrated an understanding of the instructions.   The patient was advised to call back or seek an in-person evaluation if the symptoms worsen or if the condition fails to improve as anticipated.  I provided 15 minutes of non-face-to-face time during this encounter.   Levonne Spiller, MD  [facility] MD/PA/NP OP Progress Note  07/20/2022 1:41 PM CARDELIA SASSANO  MRN:  [id number]  Chief Complaint:  Chief Complaint  Patient presents with   Depression   Anxiety   Follow-up   HPI: This patient is a 71 year old widowed white female who lives alone in [place]. She has one stepchild. Her step son killed himself 12 years ago.Lauren [name] He was an alcoholic. The patient is on disability for fibromyalgia   The patient returns for follow-up after 3 months regarding depression anxiety and insomnia.  She has had a lot of medical issues primarily with her irritable bowel syndrome.  She recently had a colonoscopy and had a polyp removed.  She did not have anything malignant going on.  She is now on Bentyl which is helped to some degree and she is also on an FODMAP diet.  Because of this as well as allergies she has been staying in a lot which is made her feel  Lauren bit more down.  However overall her mood has been stable and does feel like she is doing somewhat better with the increased Effexor XR.  She is sleeping well with the Ativan.  She has not had significant mood swings.  She denies any thoughts of self-harm or suicide Visit Diagnosis:    ICD-10-CM   1. Major depressive disorder, recurrent, severe without psychotic features ([place])  F33.2     2. Generalized anxiety disorder  F41.1     3. Insomnia due to mental disorder  F51.05       Past Psychiatric History: Long history of depression and mood instability.  She was hospitalized about 6 years ago for suicidal ideation   Past Medical History:  Past Medical History:  Diagnosis Date   Anxiety    Chronic pain    COPD (chronic obstructive pulmonary disease) (HCC)    Depression    Diabetes mellitus without complication (HCC)    Fibromyalgia    Fibromyalgia    GERD (gastroesophageal reflux disease)    Headache    Hepatitis C    History of blood transfusion    History of bronchitis    IBS (irritable bowel syndrome)    Osteoarthritis    Pneumonia    Prediabetes    Shortness of breath dyspnea    allergies; increased pain;    Vertigo    Wears glasses     Past Surgical History:  Procedure Laterality Date   BIOPSY  09/16/2020  Procedure: BIOPSY;  Surgeon: Harvel Quale, MD;  [facility];  Service: Gastroenterology;;   BIOPSY  07/11/2022   Procedure: BIOPSY;  Surgeon: [name], DO;  [facility];  Service: Endoscopy;;   CATARACT EXTRACTION W/PHACO Right 08/01/2017   Procedure: CATARACT EXTRACTION PHACO AND INTRAOCULAR LENS PLACEMENT RIGHT EYE;  Surgeon: Rutherford Guys, MD;  Location: [facility];  Service: Ophthalmology;  Laterality: Right;  CDE: 3.30   CATARACT EXTRACTION W/PHACO Left 08/15/2017   Procedure: CATARACT EXTRACTION PHACO AND INTRAOCULAR LENS PLACEMENT (IOC);  Surgeon: Rutherford Guys, MD;  Location: [facility];  Service: Ophthalmology;   Laterality: Left;  CDE: 4.00   CHOLECYSTECTOMY     COLONOSCOPY     COLONOSCOPY N/A 10/23/2014   Procedure: COLONOSCOPY;  Surgeon: Rogene Houston, MD;  [facility];  Service: Endoscopy;  Laterality: N/A;  1030   COLONOSCOPY WITH PROPOFOL N/A 07/11/2022   Procedure: COLONOSCOPY WITH PROPOFOL;  Surgeon: [name], DO;  [facility];  Service: Endoscopy;  Laterality: N/A;  2:30pm, asa 3   ESOPHAGOGASTRODUODENOSCOPY N/A 10/23/2014   Procedure: ESOPHAGOGASTRODUODENOSCOPY (EGD);  Surgeon: Rogene Houston, MD;  [facility];  Service: Endoscopy;  Laterality: N/A;   ESOPHAGOGASTRODUODENOSCOPY (EGD) WITH PROPOFOL N/A 09/16/2020   Procedure: ESOPHAGOGASTRODUODENOSCOPY (EGD) WITH PROPOFOL;  Surgeon: Harvel Quale, MD;  [facility];  Service: Gastroenterology;  Laterality: N/A;  7:30   KNEE ARTHROSCOPY Left 10/21/2015   Procedure: ARTHROSCOPY LEFT KNEE WITH MENICAL DEBRIDEMENT, Chondroplasty;  Surgeon: Gaynelle Arabian, MD;  Location: [facility];  Service: Orthopedics;  Laterality: Left;   MANDIBLE SURGERY  09/06/1979   ORIF WRIST FRACTURE Right 07/23/2013   Procedure: OPEN REDUCTION INTERNAL FIXATION (ORIF) WRIST FRACTURE;  Surgeon: Roseanne Kaufman, MD;  Location: [place];  Service: Orthopedics;  Laterality: Right;   SAVORY DILATION  09/16/2020   Procedure: SAVORY DILATION;  Surgeon: Montez Morita, Quillian Quince, MD;  [facility];  Service: Gastroenterology;;   TONSILLECTOMY     TOTAL KNEE ARTHROPLASTY     2011 rt knee   TOTAL KNEE ARTHROPLASTY Left 04/16/2018   Procedure: LEFT TOTAL KNEE ARTHROPLASTY;  Surgeon: Gaynelle Arabian, MD;  Location[facility];  Service: Orthopedics;  Laterality: Left;   UPPER GASTROINTESTINAL ENDOSCOPY      Family Psychiatric History: See below  Family History:  Family History  Problem Relation Age of Onset   Alcohol abuse Father    Diabetes Father    Stroke Father    Depression Father    Hypertension Father     Heart attack Father    High Cholesterol Father    Alcohol abuse Maternal Grandfather    Alcohol abuse Maternal Grandmother    Hypertension Maternal Grandmother    Alcohol abuse Paternal Grandfather    Alcohol abuse Paternal Grandmother    Diabetes Paternal Grandmother    Stroke Mother    Irritable bowel syndrome Mother    Sexual abuse Mother    Hypertension Mother    Breast cancer Mother    Diabetes Brother    Healthy Brother    Diabetes Brother    Heart disease Brother    Sarcoidosis Brother    Heart failure Brother    Anxiety disorder Paternal Uncle    Alcohol abuse Paternal Uncle    Alcohol abuse Cousin    Anxiety disorder Maternal Uncle    ADD / ADHD Neg Hx    Bipolar disorder Neg Hx    Dementia Neg Hx  Drug abuse Neg Hx    Paranoid behavior Neg Hx    Schizophrenia Neg Hx    Seizures Neg Hx    Physical abuse Neg Hx     Social History:  Social History   Socioeconomic History   Marital status: Widowed    Spouse name: Not on file   Number of children: Not on file   Years of education: Not on file   Highest education level: Not on file  Occupational History   Not on file  Tobacco Use   Smoking status: Former    Packs/day: 2.00    Years: 15.00    Total pack years: 30.00    Types: Cigarettes    Quit date: 09/05/1982    Years since quitting: 39.8   Smokeless tobacco: Never  Vaping Use   Vaping Use: Never used  Substance and Sexual Activity   Alcohol use: No    Alcohol/week: 0.0 standard drinks of alcohol    Comment: history of alcoholism; pt states has been sober 28 years   Drug use: No   Sexual activity: Never  Other Topics Concern   Not on file  Social History Narrative   Not on file   Social Determinants of Health   Financial Resource Strain: Not on file  Food Insecurity: Not on file  Transportation Needs: Not on file  Physical Activity: Not on file  Stress: Not on file  Social Connections: Not on file    Allergies:  Allergies  Allergen  Reactions   Elavil [Amitriptyline] Other (See Comments)    Vivid dreams and almost suicidal   Flagyl [Metronidazole] Itching   Vistaril [Hydroxyzine Hcl] Other (See Comments)    Got higher than a kite on too much of this.   Geodon [Ziprasidone Hydrochloride] Other (See Comments)    Blacked out   Lactose Intolerance (Gi) Other (See Comments)    GI upset   Latex Other (See Comments)    Red at site   Other Other (See Comments)    Metabolic Disorder Labs: Lab Results  Component Value Date   HGBA1C 6.1 (H) 10/13/2015   MPG 128 10/13/2015   No results found for: "PROLACTIN" No results found for: "CHOL", "TRIG", "HDL", "CHOLHDL", "VLDL", "LDLCALC" Lab Results  Component Value Date   TSH 2.400 03/19/2014    Therapeutic Level Labs: No results found for: "LITHIUM" No results found for: "VALPROATE" No results found for: "CBMZ"  Current Medications: Current Outpatient Medications  Medication Sig Dispense Refill   acetaminophen (TYLENOL) 500 MG tablet Take 500 mg by mouth every 6 (six) hours as needed for moderate pain.     albuterol (VENTOLIN HFA) 108 (90 Base) MCG/ACT inhaler INHALE TWO PUFFS BY MOUTH INTO LUNGS EVERY 4 HOURS AS NEEDED FOR SHORTNESS OF BREATH OR wheezing 8.5 g 3   CALCIUM MAGNESIUM ZINC PO Take 1 tablet by mouth daily.     Cholecalciferol (VITAMIN D-3) 25 MCG (1000 UT) CAPS Take 1,000 Units by mouth daily.     [employer] Omega-3 Krill Oil 350 MG CAPS Take 350 mg by mouth daily.     Cyanocobalamin (B-12 PO) Place 1 drop under the tongue daily.     dexlansoprazole (DEXILANT) 60 MG capsule Take 1 capsule (60 mg total) by mouth daily. 30 capsule 11   diclofenac sodium (VOLTAREN) 1 % GEL Apply 2 g topically 4 (four) times daily as needed (pain).     dicyclomine (BENTYL) 10 MG capsule Take 1 capsule (10 mg total) by mouth 4 (  four) times daily -  before meals and at bedtime. 360 capsule 3   diphenoxylate-atropine (LOMOTIL) 2.5-0.025 MG tablet TAKE ONE TABLET BY MOUTH THREE TIMES  DAILY AS NEEDED 60 tablet 1   fluticasone (FLONASE) 50 MCG/ACT nasal spray Place 2 sprays into both nostrils in the morning and at bedtime.     gabapentin (NEURONTIN) 100 MG capsule Take 200-300 mg by mouth See admin instructions. Take '200mg'$  by mouth every day in the morning and take '300mg'$  by mouth at bedtime     Glucosamine-Chondroitin (GLUCOSAMINE CHONDR COMPLEX PO) Take 6 tablets by mouth daily. Glucosamine '1500mg'$  and Chondroitin sulfate '1200mg'$      lamoTRIgine (LAMICTAL) 100 MG tablet Take 1 tablet (100 mg total) by mouth 2 (two) times daily. 180 tablet 2   LORazepam (ATIVAN) 0.5 MG tablet Take 1 tablet (0.5 mg total) by mouth daily. 30 tablet 2   losartan (COZAAR) 50 MG tablet Take 50 mg by mouth daily.     metroNIDAZOLE (METROGEL) 1 % gel Apply 1 application  topically at bedtime.     montelukast (SINGULAIR) 10 MG tablet TAKE ONE TABLET BY MOUTH EVERYDAY AT BEDTIME 30 tablet 5   Multiple Vitamin (MULTIVITAMIN WITH MINERALS) TABS tablet Take 1 tablet by mouth daily.     Multiple Vitamins-Minerals (HAIR SKIN AND NAILS FORMULA) TABS Take 2 tablets by mouth daily.     pantoprazole (PROTONIX) 40 MG tablet Take 40 mg by mouth 2 (two) times daily.     rosuvastatin (CRESTOR) 20 MG tablet Take 20 mg by mouth daily.     TRELEGY ELLIPTA 200-62.5-25 MCG/ACT AEPB INHALE 1 PUFF BY MOUTH INTO LUNGS ONCE DAILY 60 each 11   Turmeric (QC TUMERIC COMPLEX) 500 MG CAPS Take 500 mg by mouth daily.     venlafaxine XR (EFFEXOR-XR) 150 MG 24 hr capsule TAKE TWO CAPSULES BY MOUTH ONCE DAILY 60 capsule 2   vitamin E 400 UNIT capsule Take 400 Units daily by mouth.     No current facility-administered medications for this visit.     Musculoskeletal: Strength & Muscle Tone: na Gait & Station: na Patient leans: N/A  Psychiatric Specialty Exam: Review of Systems  HENT:  Positive for congestion.   Gastrointestinal:  Positive for diarrhea.  All other systems reviewed and are negative.   There were no vitals  taken for this visit.There is no height or weight on file to calculate BMI.  General Appearance: NA  Eye Contact:  NA  Speech:  Clear and Coherent  Volume:  Normal  Mood:  Anxious and Euthymic  Affect:  NA  Thought Process:  Goal Directed  Orientation:  Full (Time, Place, and Person)  Thought Content: WDL   Suicidal Thoughts:  No  Homicidal Thoughts:  No  Memory:  Immediate;   Good Recent;   Fair Remote;   Fair  Judgement:  Good  Insight:  Good  Psychomotor Activity:  Normal  Concentration:  Concentration: Good and Attention Span: Good  Recall:  Good  Fund of Knowledge: Good  Language: Good  Akathisia:  No  Handed:  Right  AIMS (if indicated): not done  Assets:  Communication Skills Desire for Improvement Resilience Social Support Talents/Skills  ADL's:  Intact  Cognition: WNL  Sleep:  Good   Screenings: AUDIT    Flowsheet Row Admission (Discharged) from 03/18/2014 in [employer] [facility]  Alcohol Use Disorder Identification Test Final Score (AUDIT) 0      GAD-7    Health and safety inspector  from 10/21/2020 in [place] Counselor from 06/20/2018 in [place]  Total GAD-7 Score 10 16      PHQ2-9    Flowsheet Row Counselor from 05/19/2022 in [place] Video Visit from 04/13/2022 in [place] Counselor from 04/11/2022 in [place] Counselor from 03/10/2022 in [place] Counselor from 02/24/2022 in [facility]  PHQ-2 Total Score '2 3 1 4 5  '$ PHQ-9 Total Score '7 9 9 16 22      '$ Flowsheet Row Admission (Discharged) from 07/11/2022 in [facility] from 07/06/2022 in [place] Video Visit from  12/23/2021 in [place] No Risk No Risk No Risk        Assessment and Plan: This patient is a 71 year old female with a history of depression anxiety possible bipolar disorder.  She seems to be stable in terms of her mental status.  She will continue Lamictal 100 mg twice daily for mood stabilization, Effexor XR 300 mg daily for depression and lorazepam 0.5 mg at bedtime for sleep.  She will return to see me in 3 months  Collaboration of Care: Collaboration of Care: Referral or follow-up with counselor/therapist AEB we will continue therapy with Maurice Small in our office  Patient/Guardian was advised Release of Information must be obtained prior to any record release in order to collaborate their care with an outside provider. Patient/Guardian was advised if they have not already done so to contact the registration department to sign all necessary forms in order for [place] to release information regarding their care.   Consent: Patient/Guardian gives verbal consent for treatment and assignment of benefits for services provided during this visit. Patient/Guardian expressed understanding and agreed to proceed.    Levonne Spiller, MD 07/20/2022, 1:41 PM

## 2022-08-03 ENCOUNTER — Other Ambulatory Visit (HOSPITAL_COMMUNITY): Payer: Self-pay | Admitting: Otolaryngology

## 2022-08-03 DIAGNOSIS — J342 Deviated nasal septum: Secondary | ICD-10-CM | POA: Diagnosis not present

## 2022-08-03 DIAGNOSIS — H903 Sensorineural hearing loss, bilateral: Secondary | ICD-10-CM | POA: Diagnosis not present

## 2022-08-03 DIAGNOSIS — J310 Chronic rhinitis: Secondary | ICD-10-CM | POA: Diagnosis not present

## 2022-08-03 DIAGNOSIS — R42 Dizziness and giddiness: Secondary | ICD-10-CM | POA: Diagnosis not present

## 2022-08-03 DIAGNOSIS — J320 Chronic maxillary sinusitis: Secondary | ICD-10-CM

## 2022-08-03 DIAGNOSIS — J343 Hypertrophy of nasal turbinates: Secondary | ICD-10-CM | POA: Diagnosis not present

## 2022-08-08 DIAGNOSIS — H524 Presbyopia: Secondary | ICD-10-CM | POA: Diagnosis not present

## 2022-08-08 DIAGNOSIS — H5202 Hypermetropia, left eye: Secondary | ICD-10-CM | POA: Diagnosis not present

## 2022-08-08 DIAGNOSIS — H5211 Myopia, right eye: Secondary | ICD-10-CM | POA: Diagnosis not present

## 2022-08-08 DIAGNOSIS — Z961 Presence of intraocular lens: Secondary | ICD-10-CM | POA: Diagnosis not present

## 2022-08-08 DIAGNOSIS — H52203 Unspecified astigmatism, bilateral: Secondary | ICD-10-CM | POA: Diagnosis not present

## 2022-08-08 DIAGNOSIS — E119 Type 2 diabetes mellitus without complications: Secondary | ICD-10-CM | POA: Diagnosis not present

## 2022-08-09 ENCOUNTER — Encounter (HOSPITAL_COMMUNITY): Payer: Self-pay

## 2022-08-09 ENCOUNTER — Ambulatory Visit (HOSPITAL_COMMUNITY): Payer: Medicare Other | Admitting: Psychiatry

## 2022-08-23 ENCOUNTER — Ambulatory Visit (HOSPITAL_COMMUNITY): Payer: Medicare Other | Admitting: Psychiatry

## 2022-08-24 DIAGNOSIS — Z1283 Encounter for screening for malignant neoplasm of skin: Secondary | ICD-10-CM | POA: Diagnosis not present

## 2022-08-24 DIAGNOSIS — D485 Neoplasm of uncertain behavior of skin: Secondary | ICD-10-CM | POA: Diagnosis not present

## 2022-08-24 DIAGNOSIS — L218 Other seborrheic dermatitis: Secondary | ICD-10-CM | POA: Diagnosis not present

## 2022-08-24 DIAGNOSIS — D225 Melanocytic nevi of trunk: Secondary | ICD-10-CM | POA: Diagnosis not present

## 2022-08-24 DIAGNOSIS — B078 Other viral warts: Secondary | ICD-10-CM | POA: Diagnosis not present

## 2022-08-24 DIAGNOSIS — L988 Other specified disorders of the skin and subcutaneous tissue: Secondary | ICD-10-CM | POA: Diagnosis not present

## 2022-08-25 ENCOUNTER — Ambulatory Visit (HOSPITAL_COMMUNITY)
Admission: RE | Admit: 2022-08-25 | Discharge: 2022-08-25 | Disposition: A | Discharge status: Home / Self Care | Payer: Medicare Other | Source: Ambulatory Visit | Attending: Otolaryngology | Admitting: Otolaryngology

## 2022-08-25 DIAGNOSIS — H5711 Ocular pain, right eye: Secondary | ICD-10-CM | POA: Diagnosis not present

## 2022-08-25 DIAGNOSIS — E119 Type 2 diabetes mellitus without complications: Secondary | ICD-10-CM | POA: Diagnosis not present

## 2022-08-25 DIAGNOSIS — J320 Chronic maxillary sinusitis: Secondary | ICD-10-CM | POA: Diagnosis not present

## 2022-08-25 DIAGNOSIS — M25539 Pain in unspecified wrist: Secondary | ICD-10-CM | POA: Diagnosis not present

## 2022-08-25 DIAGNOSIS — M810 Age-related osteoporosis without current pathological fracture: Secondary | ICD-10-CM | POA: Diagnosis not present

## 2022-08-25 DIAGNOSIS — M1380 Other specified arthritis, unspecified site: Secondary | ICD-10-CM | POA: Diagnosis not present

## 2022-08-25 DIAGNOSIS — J342 Deviated nasal septum: Secondary | ICD-10-CM | POA: Diagnosis not present

## 2022-08-25 DIAGNOSIS — M1990 Unspecified osteoarthritis, unspecified site: Secondary | ICD-10-CM | POA: Diagnosis not present

## 2022-09-06 ENCOUNTER — Ambulatory Visit (INDEPENDENT_AMBULATORY_CARE_PROVIDER_SITE_OTHER): Payer: Medicare Other | Admitting: Psychiatry

## 2022-09-06 DIAGNOSIS — F411 Generalized anxiety disorder: Secondary | ICD-10-CM

## 2022-09-06 DIAGNOSIS — F332 Major depressive disorder, recurrent severe without psychotic features: Secondary | ICD-10-CM | POA: Diagnosis not present

## 2022-09-06 NOTE — Progress Notes (Signed)
Virtual Visit via Telephone Note  I connected with Lauren [name] on 09/06/22 at 2:10 PM EST by telephone and verified that I am speaking with the correct person using two identifiers.  Location: Patient: Home Provider: [place] office    I discussed the limitations, risks, security and privacy concerns of performing an evaluation and management service by telephone and the availability of in person appointments. I also discussed with the patient that there may be a patient responsible charge related to this service. The patient expressed understanding and agreed to proceed.   I provided 46 minutes of non-face-to-face time during this encounter.   [name], LCSW         THERAPIST PRORESS NOTE    Session Time:  Tuesday 09/06/2022 2:10 PM - 2:56 PM   Participation Level: Active  Behavioral Response: CasualAlert/depressed,   Type of Therapy: Individual Therapy  Treatment Goals addressed: Patient will score less than 10 on the patient health questionnaire, patient will identify 3 cognitive patterns and beliefs that support depression  Progress on Goals: Progressing  Interventions: CBT and Supportive  Summary: Lauren [name] is a 72 y.o. female who is referred for services due to stress, anxiety, and depression.  She has participated in therapy intermittently for the past 25 years.  She reports 3 psychiatric hospitalizations with the last 1 occurring over 5 years ago at [facility].  Patient continues to deal with grief and loss issues regarding the death of her husband last year.  She also reports ruminations, difficulty completing tasks at home, depressed mood, and irritability.  She reports staying at home more as she has memory difficulty and is embarrassed about this.  She also reports fears that she may have to go to a nursing home should she have dementia.  She is scheduled for a follow-up appointment with psychologist regarding recent  cognitive testing.                      Patient last was seen via virtual visit about 8-9 weeks ago. She reports minimal symptoms of depression since last session as reflected in the PHQ-2.  She also reports decreased intensity and frequency of symptoms of anxiety.  She has had several health issues including intense arthritic pain.  She now is taking prednisone and says this is very helpful.  She reports experiencing some sadness during the holidays as she experienced increased memories of deceased husband.  However, she reports managing this well.  She has maintained involvement in activities as well as socialization including going out to eat with friends and going to the movies.  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  Patient has continued to improve her mindfulness skills and her ability to identify/challenge/and replace negative/irrational thoughts with rational thoughts.  She is very pleased with her progress in therapy and expresses confidence in her ability to successfully use coping skills.    Suicidal/Homicidal: Nowithout intent/plan    Therapist Response: reviewed symptoms, praised and reinforced patient's continued behavioral activation and socialization/increased awareness of thoughts and behaviors/improved ability to identify-challenge-replace irrational thoughts with more helpful thoughts, discussed effects, discussed patient's progress in treatment, processed patient's feelings about termination at next session, encouraged patient to continue current efforts regarding using coping skills, agreed to do termination at next session    Diagnosis: Axis I: MDD, Recurrent, GAD         Collaboration of Care: AEB psychiatrist, therapist  encouraged patient to schedule an appointment with psychiatrist Dr. Harrington Challenger for medication management  Patient/Guardian was advised Release of Information must be obtained prior to any record release in order to collaborate their care  with an outside provider. Patient/Guardian was advised if they have not already done so to contact the registration department to sign all necessary forms in order for [place] to release information regarding their care.   Consent: Patient/Guardian gives verbal consent for treatment and assignment of benefits for services provided during this visit. Patient/Guardian expressed understanding and agreed to proceed.

## 2022-09-09 DIAGNOSIS — E7800 Pure hypercholesterolemia, unspecified: Secondary | ICD-10-CM | POA: Diagnosis not present

## 2022-09-09 DIAGNOSIS — E1169 Type 2 diabetes mellitus with other specified complication: Secondary | ICD-10-CM | POA: Diagnosis not present

## 2022-09-09 DIAGNOSIS — I10 Essential (primary) hypertension: Secondary | ICD-10-CM | POA: Diagnosis not present

## 2022-09-15 ENCOUNTER — Encounter: Payer: Self-pay | Admitting: Internal Medicine

## 2022-09-18 ENCOUNTER — Other Ambulatory Visit (INDEPENDENT_AMBULATORY_CARE_PROVIDER_SITE_OTHER): Payer: Self-pay | Admitting: Gastroenterology

## 2022-09-18 DIAGNOSIS — R197 Diarrhea, unspecified: Secondary | ICD-10-CM

## 2022-09-27 DIAGNOSIS — Z79899 Other long term (current) drug therapy: Secondary | ICD-10-CM | POA: Diagnosis not present

## 2022-09-27 DIAGNOSIS — E119 Type 2 diabetes mellitus without complications: Secondary | ICD-10-CM | POA: Diagnosis not present

## 2022-09-27 DIAGNOSIS — M1380 Other specified arthritis, unspecified site: Secondary | ICD-10-CM | POA: Diagnosis not present

## 2022-09-27 DIAGNOSIS — M1990 Unspecified osteoarthritis, unspecified site: Secondary | ICD-10-CM | POA: Diagnosis not present

## 2022-09-27 DIAGNOSIS — M25539 Pain in unspecified wrist: Secondary | ICD-10-CM | POA: Diagnosis not present

## 2022-09-27 DIAGNOSIS — M810 Age-related osteoporosis without current pathological fracture: Secondary | ICD-10-CM | POA: Diagnosis not present

## 2022-10-04 ENCOUNTER — Ambulatory Visit (HOSPITAL_COMMUNITY): Payer: Medicare Other | Admitting: Psychiatry

## 2022-10-06 NOTE — Progress Notes (Incomplete)
Cardiology Office Note   Date:  10/06/2022   ID:  Lauren [name], DOB October 08, 1950, MRN [id number]  PCP:  Caren Macadam, MD  Cardiologist:   Jenkins Rouge, MD    History of Present Illness: Lauren [name] is a 72 y.o. female who presents for consultation regarding exertional dyspnea. Referred by Dr Gerarda Fraction and first seen by me on 11/27/20  Previous w/u for same benign. Normal echo  and myovue in 2019 Also had normal echo 09/25/19 CXR done 11/03/20 NAD She quit smoking in 83   She is on disability for fibromyalgia. She sees behavioral health for depression Previous ETOH abuse sober over 27 years Depression worse as dog died in 2023-01-05 and husband in 2021-04-05  Currently on Ativan, Lamictal and Effexor   She has complained of RLE pain with LE varicosities that are superficial Mild swelling  Duplex done 11/22/20 no DVT She is to have a swallowing study post EGD not clear why she has cyclic cough  Cardiac CTA 07/01/22 with calcium score 581 , 92 nd percentile CAD RADS 2 non obstructive dx 25-49% in LAD    ***    Past Medical History:  Diagnosis Date   Anxiety    Chronic pain    COPD (chronic obstructive pulmonary disease) (HCC)    Depression    Diabetes mellitus without complication (HCC)    Fibromyalgia    Fibromyalgia    GERD (gastroesophageal reflux disease)    Headache    Hepatitis C    History of blood transfusion    History of bronchitis    IBS (irritable bowel syndrome)    Osteoarthritis    Pneumonia    Prediabetes    Shortness of breath dyspnea    allergies; increased pain;    Vertigo    Wears glasses     Past Surgical History:  Procedure Laterality Date   BIOPSY  09/16/2020   Procedure: BIOPSY;  Surgeon: Harvel Quale, MD;  [facility];  Service: Gastroenterology;;   BIOPSY  07/11/2022   Procedure: BIOPSY;  Surgeon: [name], DO;  [facility];  Service: Endoscopy;;   CATARACT EXTRACTION W/PHACO Right 08/01/2017    Procedure: CATARACT EXTRACTION PHACO AND INTRAOCULAR LENS PLACEMENT RIGHT EYE;  Surgeon: Rutherford Guys, MD;  Location: [facility];  Service: Ophthalmology;  Laterality: Right;  CDE: 3.30   CATARACT EXTRACTION W/PHACO Left 08/15/2017   Procedure: CATARACT EXTRACTION PHACO AND INTRAOCULAR LENS PLACEMENT (IOC);  Surgeon: Rutherford Guys, MD;  Location: [facility];  Service: Ophthalmology;  Laterality: Left;  CDE: 4.00   CHOLECYSTECTOMY     COLONOSCOPY     COLONOSCOPY N/A 10/23/2014   Procedure: COLONOSCOPY;  Surgeon: Rogene Houston, MD;  [facility];  Service: Endoscopy;  Laterality: N/A;  1030   COLONOSCOPY WITH PROPOFOL N/A 07/11/2022   Procedure: COLONOSCOPY WITH PROPOFOL;  Surgeon: [name], DO;  [facility];  Service: Endoscopy;  Laterality: N/A;  2:30pm, asa 3   ESOPHAGOGASTRODUODENOSCOPY N/A 10/23/2014   Procedure: ESOPHAGOGASTRODUODENOSCOPY (EGD);  Surgeon: Rogene Houston, MD;  [facility];  Service: Endoscopy;  Laterality: N/A;   ESOPHAGOGASTRODUODENOSCOPY (EGD) WITH PROPOFOL N/A 09/16/2020   Procedure: ESOPHAGOGASTRODUODENOSCOPY (EGD) WITH PROPOFOL;  Surgeon: Harvel Quale, MD;  [facility];  Service: Gastroenterology;  Laterality: N/A;  7:30   KNEE ARTHROSCOPY Left 10/21/2015   Procedure: ARTHROSCOPY LEFT KNEE WITH MENICAL DEBRIDEMENT, Chondroplasty;  Surgeon: Gaynelle Arabian, MD;  Location:  [facility];  Service: Orthopedics;  Laterality: Left;   MANDIBLE SURGERY  09/06/1979   ORIF WRIST FRACTURE Right 07/23/2013   Procedure: OPEN REDUCTION INTERNAL FIXATION (ORIF) WRIST FRACTURE;  Surgeon: Roseanne Kaufman, MD;  Location: [facility];  Service: Orthopedics;  Laterality: Right;   SAVORY DILATION  09/16/2020   Procedure: SAVORY DILATION;  Surgeon: Montez Morita, Quillian Quince, MD;  [facility];  Service: Gastroenterology;;   TONSILLECTOMY     TOTAL KNEE ARTHROPLASTY     2011 rt knee   TOTAL KNEE ARTHROPLASTY Left 04/16/2018   Procedure:  LEFT TOTAL KNEE ARTHROPLASTY;  Surgeon: Gaynelle Arabian, MD;  Location: [facility];  Service: Orthopedics;  Laterality: Left;   UPPER GASTROINTESTINAL ENDOSCOPY       Current Outpatient Medications  Medication Sig Dispense Refill   acetaminophen (TYLENOL) 500 MG tablet Take 500 mg by mouth every 6 (six) hours as needed for moderate pain.     albuterol (VENTOLIN HFA) 108 (90 Base) MCG/ACT inhaler INHALE TWO PUFFS BY MOUTH INTO LUNGS EVERY 4 HOURS AS NEEDED FOR SHORTNESS OF BREATH OR wheezing 8.5 g 3   CALCIUM MAGNESIUM ZINC PO Take 1 tablet by mouth daily.     Cholecalciferol (VITAMIN D-3) 25 MCG (1000 UT) CAPS Take 1,000 Units by mouth daily.     [employer] Omega-3 Krill Oil 350 MG CAPS Take 350 mg by mouth daily.     Cyanocobalamin (B-12 PO) Place 1 drop under the tongue daily.     dexlansoprazole (DEXILANT) 60 MG capsule Take 1 capsule (60 mg total) by mouth daily. 30 capsule 11   diclofenac sodium (VOLTAREN) 1 % GEL Apply 2 g topically 4 (four) times daily as needed (pain).     dicyclomine (BENTYL) 10 MG capsule Take 1 capsule (10 mg total) by mouth 4 (four) times daily -  before meals and at bedtime. 360 capsule 3   diphenoxylate-atropine (LOMOTIL) 2.5-0.025 MG tablet TAKE ONE TABLET BY MOUTH THREE TIMES DAILY AS NEEDED 60 tablet 1   fluticasone (FLONASE) 50 MCG/ACT nasal spray Place 2 sprays into both nostrils in the morning and at bedtime.     gabapentin (NEURONTIN) 100 MG capsule Take 200-300 mg by mouth See admin instructions. Take '200mg'$  by mouth every day in the morning and take '300mg'$  by mouth at bedtime     Glucosamine-Chondroitin (GLUCOSAMINE CHONDR COMPLEX PO) Take 6 tablets by mouth daily. Glucosamine '1500mg'$  and Chondroitin sulfate '1200mg'$      lamoTRIgine (LAMICTAL) 100 MG tablet Take 1 tablet (100 mg total) by mouth 2 (two) times daily. 180 tablet 2   LORazepam (ATIVAN) 0.5 MG tablet Take 1 tablet (0.5 mg total) by mouth daily. 30 tablet 2   losartan (COZAAR) 50 MG tablet Take 50 mg by mouth  daily.     metroNIDAZOLE (METROGEL) 1 % gel Apply 1 application  topically at bedtime.     montelukast (SINGULAIR) 10 MG tablet TAKE ONE TABLET BY MOUTH EVERYDAY AT BEDTIME 30 tablet 5   Multiple Vitamin (MULTIVITAMIN WITH MINERALS) TABS tablet Take 1 tablet by mouth daily.     Multiple Vitamins-Minerals (HAIR SKIN AND NAILS FORMULA) TABS Take 2 tablets by mouth daily.     pantoprazole (PROTONIX) 40 MG tablet Take 40 mg by mouth 2 (two) times daily.     rosuvastatin (CRESTOR) 20 MG tablet Take 20 mg by mouth daily.     TRELEGY ELLIPTA 200-62.5-25 MCG/ACT AEPB INHALE 1 PUFF BY MOUTH INTO LUNGS ONCE DAILY 60 each 11   Turmeric (  QC TUMERIC COMPLEX) 500 MG CAPS Take 500 mg by mouth daily.     venlafaxine XR (EFFEXOR-XR) 150 MG 24 hr capsule TAKE TWO CAPSULES BY MOUTH ONCE DAILY 60 capsule 2   vitamin E 400 UNIT capsule Take 400 Units daily by mouth.     No current facility-administered medications for this visit.    Allergies:   Elavil [amitriptyline], Flagyl [metronidazole], Vistaril [hydroxyzine hcl], Geodon [ziprasidone hydrochloride], Lactose intolerance (gi), Latex, and Other    Social History:  The patient  reports that she quit smoking about 40 years ago. Her smoking use included cigarettes. She has a 30.00 pack-year smoking history. She has never used smokeless tobacco. She reports that she does not drink alcohol and does not use drugs.   Family History:  The patient's family history includes Alcohol abuse in her cousin, father, maternal grandfather, maternal grandmother, paternal grandfather, paternal grandmother, and paternal uncle; Anxiety disorder in her maternal uncle and paternal uncle; Breast cancer in her mother; Depression in her father; Diabetes in her brother, brother, father, and paternal grandmother; Healthy in her brother; Heart attack in her father; Heart disease in her brother; Heart failure in her brother; High Cholesterol in her father; Hypertension in her father, maternal  grandmother, and mother; Irritable bowel syndrome in her mother; Sarcoidosis in her brother; Sexual abuse in her mother; Stroke in her father and mother.    ROS:  Please see the history of present illness.   Otherwise, review of systems are positive for none.   All other systems are reviewed and negative.    PHYSICAL EXAM: VS:  There were no vitals taken for this visit. , BMI There is no height or weight on file to calculate BMI. Affect appropriate Healthy:  appears stated age 60: normal Neck supple with no adenopathy JVP normal no bruits no thyromegaly Lungs clear with no wheezing and good diaphragmatic motion Heart:  S1/S2 no murmur, no rub, gallop or click PMI normal Abdomen: benighn, BS positve, no tenderness, no AAA no bruit.  No HSM or HJR Distal pulses intact with no bruits Trace right LE edema with abundant superficial varicosities  Neuro non-focal Skin warm and dry No muscular weakness    EKG:  01/26/17 SR rate 78 normal  02/22/18 SR rate 84 inferolateral Q waves 10/06/2022 NSR rate 89 normal    Recent Labs: 06/22/2022: BUN 13; Creatinine, Ser 0.82; Potassium 4.8; Sodium 140    Lipid Panel No results found for: "CHOL", "TRIG", "HDL", "CHOLHDL", "VLDL", "LDLCALC", "LDLDIRECT"    Wt Readings from Last 3 Encounters:  07/06/22 152 lb 6.4 oz (69.1 kg)  06/30/22 152 lb 6.4 oz (69.1 kg)  06/07/22 153 lb (69.4 kg)      Other studies Reviewed: Additional studies/ records that were reviewed today include:  . CXR 11/03/20 Echo 09/25/19 Myovue 2019    ASSESSMENT AND PLAN:  1.  Dyspnea no obvious cardiac etiology echo 09/25/19 normal  CXR normal 11/03/20 observe 2. HLD continue statin labs with primary  3. Depression/Anxiety on Effexor Lamictal and Xanax  4. Fibromyalgia  ? Etiology for disability  5. HTN:  On ACE improved  6. GI: with GERD/Dysphagia EGD 09/16/20 with no pathology but "entire esophagus dilated" small Hiatal hernia Rx protonix difficulty affording dexilant   7. LE varicosities with pain in RLE no DVT on duplex 8. Chest pain:  non obstructive dx on cardiac CTA 07/01/22 risk factor modification  ASA and statin    F/U in a year   Signed,  Jenkins Rouge, MD  10/06/2022 6:37 PM    [facility] [place], [place], [place]  [address] Phone: [phone]; Fax: [phone]

## 2022-10-14 ENCOUNTER — Ambulatory Visit: Payer: Medicare Other | Admitting: Cardiovascular Disease

## 2022-10-19 ENCOUNTER — Ambulatory Visit (INDEPENDENT_AMBULATORY_CARE_PROVIDER_SITE_OTHER): Payer: Medicare Other | Admitting: Psychiatry

## 2022-10-19 DIAGNOSIS — F411 Generalized anxiety disorder: Secondary | ICD-10-CM

## 2022-10-19 DIAGNOSIS — F332 Major depressive disorder, recurrent severe without psychotic features: Secondary | ICD-10-CM

## 2022-10-19 NOTE — Progress Notes (Signed)
IN-PERSON     THERAPIST PRORESS NOTE    Session Time:     10/19/2022 1:10 PM - 2:00 PM   Participation Level: Active  Behavioral Response: CasualAlert/euthymic  Type of Therapy: Individual Therapy  Treatment Goals addressed: Patient will score less than 10 on the patient health questionnaire, patient will identify 3 cognitive patterns and beliefs that support depression  Progress on Goals: Progressing  Interventions: CBT and Supportive  Summary: Lauren [name] is a 72 y.o. female who is referred for services due to stress, anxiety, and depression.  She has participated in therapy intermittently for the past 25 years.  She reports 3 psychiatric hospitalizations with the last 1 occurring over 5 years ago at [facility].  Patient continues to deal with grief and loss issues regarding the death of her husband last year.  She also reports ruminations, difficulty completing tasks at home, depressed mood, and irritability.  She reports staying at home more as she has memory difficulty and is embarrassed about this.  She also reports fears that she may have to go to a nursing home should she have dementia.  She is scheduled for a follow-up appointment with psychologist regarding recent cognitive testing.                      Patient last was seen via virtual visit about 8-9 weeks ago. She reports minimal symptoms of depression  and decreased intensity and duration of symptoms of anxiety.  She has experienced stressors regarding her health but has managed well.  She reports increased thoughts about deceased husband but also managing this well.  She reports sometimes having to push herself to become involved in activities but then feels better when she does this.  Patient reports remains involved in activities including attending church, [place] meetings, and socializing with her friends. She continues to  use coping tools practiced in session.  She reports being much more  aware of when she negative spiraling thoughts but has been able to identify/challenge/and replace with more rational thoughts.  She is pleased with her progress in therapy but also expresses some anxiety about leaving therapy.       Suicidal/Homicidal: Nowithout intent/plan    Therapist Response: reviewed symptoms, praised and reinforced patient's continued behavioral activation and socialization/increased awareness of thoughts and behaviors/improved ability to identify-challenge-replace irrational thoughts with more helpful thoughts, discussed effects, discussed patient's progress in treatment, developed mental health maintenance plan, processed patient's feelings about termination, assist patient identify/challenge/replace negative thoughts about leaving therapy with more rational thoughts, also gave patient reassurance of being able to return to therapy once services are terminated, developed plan with patient to review mental health maintenance plan at least weekly,developed plan with patient to return for a check-in session in 7 to 8 weeks.     Diagnosis: Axis I: MDD, Recurrent, GAD         Collaboration of Care: AEB psychiatrist, therapist encouraged patient to schedule an appointment with psychiatrist Dr. Harrington Challenger for medication management  Patient/Guardian was advised Release of Information must be obtained prior to any record release in order to collaborate their care with an outside provider. Patient/Guardian was advised if they have not already done so to contact the registration department to sign all necessary forms in order for [place] to release information regarding their care.   Consent: Patient/Guardian gives verbal consent for treatment and assignment of benefits for  services provided during this visit. Patient/Guardian expressed understanding and agreed to proceed.        Maurice Small, LCSW

## 2022-10-20 ENCOUNTER — Ambulatory Visit (HOSPITAL_COMMUNITY): Payer: Medicare Other | Admitting: Psychiatry

## 2022-11-09 ENCOUNTER — Ambulatory Visit (INDEPENDENT_AMBULATORY_CARE_PROVIDER_SITE_OTHER): Payer: Medicare Other | Admitting: Psychiatry

## 2022-11-09 ENCOUNTER — Encounter (HOSPITAL_COMMUNITY): Payer: Self-pay | Admitting: Psychiatry

## 2022-11-09 VITALS — BP 127/73 | HR 96 | Ht 66.0 in | Wt 157.4 lb

## 2022-11-09 DIAGNOSIS — F332 Major depressive disorder, recurrent severe without psychotic features: Secondary | ICD-10-CM | POA: Diagnosis not present

## 2022-11-09 DIAGNOSIS — F411 Generalized anxiety disorder: Secondary | ICD-10-CM

## 2022-11-09 MED ORDER — VENLAFAXINE HCL ER 150 MG PO CP24: ORAL_CAPSULE | ORAL | 2 refills | Status: DC

## 2022-11-09 MED ORDER — LORAZEPAM 0.5 MG PO TABS: 0.2500 mg | ORAL_TABLET | Freq: Every day | ORAL | 2 refills | Status: DC

## 2022-11-09 MED ORDER — LAMOTRIGINE 100 MG PO TABS: 100.0000 mg | ORAL_TABLET | Freq: Two times a day (BID) | ORAL | 2 refills | Status: DC

## 2022-11-09 NOTE — Progress Notes (Signed)
BH MD/PA/NP OP Progress Note  11/09/2022 2:03 PM Lauren [name]  MRN:  [id number]  Chief Complaint:  Chief Complaint  Patient presents with   Depression   Anxiety   Follow-up   HPI: This patient is a 72 year old widowed white female who lives alone in [place]. She has one stepchild. Her step son killed himself 12 years ago.Marland Kitchen He was an alcoholic. The patient is on disability for fibromyalgia  Returns for follow-up after about 6 months regarding her depression anxiety and insomnia.  For the most part she has been doing okay.  She still misses her husband who died a couple of years ago.  She is staying active with her dog and one of her best friends.  She denies significant depression or anxiety but still has ups and downs at times.  She is sleeping well and her appetite has been fairly good.  She only uses a small amount of the Ativan like 0.25 mg.  She denies any thoughts of self-harm or suicide.  She denies manic symptoms like racing thoughts or impulsivity  Visit Diagnosis:    ICD-10-CM   1. Major depressive disorder, recurrent, severe without psychotic features ([place])  F33.2     2. Generalized anxiety disorder  F41.1       Past Psychiatric History: Long history of depression mood instability.  She was hospitalized about 7 years ago for suicidal ideation  Past Medical History:  Past Medical History:  Diagnosis Date   Anxiety    Chronic pain    COPD (chronic obstructive pulmonary disease) ([employer])    Depression    Diabetes mellitus without complication (HCC)    Fibromyalgia    Fibromyalgia    GERD (gastroesophageal reflux disease)    Headache    Hepatitis C    History of blood transfusion    History of bronchitis    IBS (irritable bowel syndrome)    Osteoarthritis    Pneumonia    Prediabetes    Shortness of breath dyspnea    allergies; increased pain;    Vertigo    Wears glasses     Past Surgical History:  Procedure Laterality Date   BIOPSY  09/16/2020   Procedure:  BIOPSY;  Surgeon: Harvel Quale, MD;  [facility];  Service: Gastroenterology;;   BIOPSY  07/11/2022   Procedure: BIOPSY;  Surgeon: [name], DO;  [facility];  Service: Endoscopy;;   CATARACT EXTRACTION W/PHACO Right 08/01/2017   Procedure: CATARACT EXTRACTION PHACO AND INTRAOCULAR LENS PLACEMENT RIGHT EYE;  Surgeon: Rutherford Guys, MD;  Location: [facility];  Service: Ophthalmology;  Laterality: Right;  CDE: 3.30   CATARACT EXTRACTION W/PHACO Left 08/15/2017   Procedure: CATARACT EXTRACTION PHACO AND INTRAOCULAR LENS PLACEMENT (IOC);  Surgeon: Rutherford Guys, MD;  Location: [facility];  Service: Ophthalmology;  Laterality: Left;  CDE: 4.00   CHOLECYSTECTOMY     COLONOSCOPY     COLONOSCOPY N/A 10/23/2014   Procedure: COLONOSCOPY;  Surgeon: Rogene Houston, MD;  [facility];  Service: Endoscopy;  Laterality: N/A;  1030   COLONOSCOPY WITH PROPOFOL N/A 07/11/2022   Procedure: COLONOSCOPY WITH PROPOFOL;  Surgeon: [name], DO;  [facility];  Service: Endoscopy;  Laterality: N/A;  2:30pm, asa 3   ESOPHAGOGASTRODUODENOSCOPY N/A 10/23/2014   Procedure: ESOPHAGOGASTRODUODENOSCOPY (EGD);  Surgeon: Rogene Houston, MD;  [facility];  Service: Endoscopy;  Laterality: N/A;   ESOPHAGOGASTRODUODENOSCOPY (EGD) WITH PROPOFOL N/A 09/16/2020   Procedure:  ESOPHAGOGASTRODUODENOSCOPY (EGD) WITH PROPOFOL;  Surgeon: Montez Morita, Quillian Quince, MD;  [facility];  Service: Gastroenterology;  Laterality: N/A;  7:30   KNEE ARTHROSCOPY Left 10/21/2015   Procedure: ARTHROSCOPY LEFT KNEE WITH MENICAL DEBRIDEMENT, Chondroplasty;  Surgeon: Gaynelle Arabian, MD;  Location: [facility];  Service: Orthopedics;  Laterality: Left;   MANDIBLE SURGERY  09/06/1979   ORIF WRIST FRACTURE Right 07/23/2013   Procedure: OPEN REDUCTION INTERNAL FIXATION (ORIF) WRIST FRACTURE;  Surgeon: Roseanne Kaufman, MD;  Location: [facility];  Service: Orthopedics;  Laterality: Right;    SAVORY DILATION  09/16/2020   Procedure: SAVORY DILATION;  Surgeon: Montez Morita, Quillian Quince, MD;  [facility];  Service: Gastroenterology;;   TONSILLECTOMY     TOTAL KNEE ARTHROPLASTY     2011 rt knee   TOTAL KNEE ARTHROPLASTY Left 04/16/2018   Procedure: LEFT TOTAL KNEE ARTHROPLASTY;  Surgeon: Gaynelle Arabian, MD;  [facility];  Service: Orthopedics;  Laterality: Left;   UPPER GASTROINTESTINAL ENDOSCOPY      Family Psychiatric History: See below  Family History:  Family History  Problem Relation Age of Onset   Alcohol abuse Father    Diabetes Father    Stroke Father    Depression Father    Hypertension Father    Heart attack Father    High Cholesterol Father    Alcohol abuse Maternal Grandfather    Alcohol abuse Maternal Grandmother    Hypertension Maternal Grandmother    Alcohol abuse Paternal Grandfather    Alcohol abuse Paternal Grandmother    Diabetes Paternal Grandmother    Stroke Mother    Irritable bowel syndrome Mother    Sexual abuse Mother    Hypertension Mother    Breast cancer Mother    Diabetes Brother    Healthy Brother    Diabetes Brother    Heart disease Brother    Sarcoidosis Brother    Heart failure Brother    Anxiety disorder Paternal Uncle    Alcohol abuse Paternal Uncle    Alcohol abuse Cousin    Anxiety disorder Maternal Uncle    ADD / ADHD Neg Hx    Bipolar disorder Neg Hx    Dementia Neg Hx    Drug abuse Neg Hx    Paranoid behavior Neg Hx    Schizophrenia Neg Hx    Seizures Neg Hx    Physical abuse Neg Hx     Social History:  Social History   Socioeconomic History   Marital status: Widowed    Spouse name: Not on file   Number of children: Not on file   Years of education: Not on file   Highest education level: Not on file  Occupational History   Not on file  Tobacco Use   Smoking status: Former    Packs/day: 2.00    Years: 15.00    Total pack years: 30.00    Types: Cigarettes    Quit date: 09/05/1982     Years since quitting: 40.2   Smokeless tobacco: Never  Vaping Use   Vaping Use: Never used  Substance and Sexual Activity   Alcohol use: No    Alcohol/week: 0.0 standard drinks of alcohol    Comment: history of alcoholism; pt states has been sober 28 years   Drug use: No   Sexual activity: Never  Other Topics Concern   Not on file  Social History Narrative   Not on file   Social Determinants of Health   Financial Resource Strain: Not on  file  Food Insecurity: Not on file  Transportation Needs: Not on file  Physical Activity: Not on file  Stress: Not on file  Social Connections: Not on file    Allergies:  Allergies  Allergen Reactions   Elavil [Amitriptyline] Other (See Comments)    Vivid dreams and almost suicidal   Flagyl [Metronidazole] Itching   Vistaril [Hydroxyzine Hcl] Other (See Comments)    Got higher than a kite on too much of this.   Geodon [Ziprasidone Hydrochloride] Other (See Comments)    Blacked out   Lactose Intolerance (Gi) Other (See Comments)    GI upset   Latex Other (See Comments)    Red at site   Other Other (See Comments)    Metabolic Disorder Labs: Lab Results  Component Value Date   HGBA1C 6.1 (H) 10/13/2015   MPG 128 10/13/2015   No results found for: "PROLACTIN" No results found for: "CHOL", "TRIG", "HDL", "CHOLHDL", "VLDL", "LDLCALC" Lab Results  Component Value Date   TSH 2.400 03/19/2014    Therapeutic Level Labs: No results found for: "LITHIUM" No results found for: "VALPROATE" No results found for: "CBMZ"  Current Medications: Current Outpatient Medications  Medication Sig Dispense Refill   acetaminophen (TYLENOL) 500 MG tablet Take 500 mg by mouth every 6 (six) hours as needed for moderate pain.     albuterol (VENTOLIN HFA) 108 (90 Base) MCG/ACT inhaler INHALE TWO PUFFS BY MOUTH INTO LUNGS EVERY 4 HOURS AS NEEDED FOR SHORTNESS OF BREATH OR wheezing 8.5 g 3   CALCIUM MAGNESIUM ZINC PO Take 1 tablet by mouth daily.      Cholecalciferol (VITAMIN D-3) 25 MCG (1000 UT) CAPS Take 1,000 Units by mouth daily.     [employer] Omega-3 Krill Oil 350 MG CAPS Take 350 mg by mouth daily.     Cyanocobalamin (B-12 PO) Place 1 drop under the tongue daily.     dexlansoprazole (DEXILANT) 60 MG capsule Take 1 capsule (60 mg total) by mouth daily. 30 capsule 11   diclofenac sodium (VOLTAREN) 1 % GEL Apply 2 g topically 4 (four) times daily as needed (pain).     dicyclomine (BENTYL) 10 MG capsule Take 1 capsule (10 mg total) by mouth 4 (four) times daily -  before meals and at bedtime. 360 capsule 3   diphenoxylate-atropine (LOMOTIL) 2.5-0.025 MG tablet TAKE ONE TABLET BY MOUTH THREE TIMES DAILY AS NEEDED 60 tablet 1   fluticasone (FLONASE) 50 MCG/ACT nasal spray Place 2 sprays into both nostrils in the morning and at bedtime.     gabapentin (NEURONTIN) 100 MG capsule Take 200-300 mg by mouth See admin instructions. Take '200mg'$  by mouth every day in the morning and take '300mg'$  by mouth at bedtime     Glucosamine-Chondroitin (GLUCOSAMINE CHONDR COMPLEX PO) Take 6 tablets by mouth daily. Glucosamine '1500mg'$  and Chondroitin sulfate '1200mg'$      losartan (COZAAR) 50 MG tablet Take by mouth daily. Taking 1.5 Tablets Daily     METHOTREXATE PO Take 6 tablets by mouth once a week.     metroNIDAZOLE (METROGEL) 1 % gel Apply 1 application  topically at bedtime.     montelukast (SINGULAIR) 10 MG tablet TAKE ONE TABLET BY MOUTH EVERYDAY AT BEDTIME 30 tablet 5   Multiple Vitamins-Minerals (HAIR SKIN AND NAILS FORMULA) TABS Take 2 tablets by mouth daily.     rosuvastatin (CRESTOR) 20 MG tablet Take 20 mg by mouth daily.     TRELEGY ELLIPTA 200-62.5-25 MCG/ACT AEPB INHALE 1 PUFF BY MOUTH  INTO LUNGS ONCE DAILY 60 each 11   Turmeric (QC TUMERIC COMPLEX) 500 MG CAPS Take 500 mg by mouth daily.     vitamin E 400 UNIT capsule Take 400 Units daily by mouth.     lamoTRIgine (LAMICTAL) 100 MG tablet Take 1 tablet (100 mg total) by mouth 2 (two) times daily. 180 tablet  2   LORazepam (ATIVAN) 0.5 MG tablet Take 0.5 tablets (0.25 mg total) by mouth daily. 15 tablet 2   venlafaxine XR (EFFEXOR-XR) 150 MG 24 hr capsule TAKE TWO CAPSULES BY MOUTH ONCE DAILY 60 capsule 2   No current facility-administered medications for this visit.     Musculoskeletal: Strength & Muscle Tone: within normal limits Gait & Station: normal Patient leans: N/A  Psychiatric Specialty Exam: Review of Systems  Musculoskeletal:  Positive for arthralgias and myalgias.  All other systems reviewed and are negative.   Blood pressure 127/73, pulse 96, height '5\' 6"'$  (1.676 m), weight 157 lb 6.4 oz (71.4 kg), SpO2 92 %.Body mass index is 25.41 kg/m.  General Appearance: Casual and Fairly Groomed  Eye Contact:  Good  Speech:  Clear and Coherent  Volume:  Normal  Mood:  Euthymic  Affect:  Congruent  Thought Process:  Goal Directed  Orientation:  Full (Time, Place, and Person)  Thought Content: WDL   Suicidal Thoughts:  No  Homicidal Thoughts:  No  Memory:  Immediate;   Good Recent;   Good Remote;   Good  Judgement:  Good  Insight:  Good  Psychomotor Activity:  Normal  Concentration:  Concentration: Good and Attention Span: Good  Recall:  Good  Fund of Knowledge: Good  Language: Good  Akathisia:  No  Handed:  Right  AIMS (if indicated): not done  Assets:  Communication Skills Desire for Improvement Resilience Social Support Talents/Skills  ADL's:  Intact  Cognition: WNL  Sleep:  Good   Screenings: AUDIT    Flowsheet Row Admission (Discharged) from 03/18/2014 in [facility]  Alcohol Use Disorder Identification Test Final Score (AUDIT) 0      GAD-7    Flowsheet Row Counselor from 10/21/2020 in [facility] at [facility] from 06/20/2018 in [facility] at [facility]  Total GAD-7 Score 10 16      PHQ2-9    Flowsheet Row Counselor from 09/06/2022 in [place] [facility] [place] from 05/19/2022 in [place] at [place] Video Visit from 04/13/2022 in [place] at [place] from 04/11/2022 in [facility] at [facility] from 03/10/2022 in [place] at [facility] Total Score '1 2 3 1 4  '$ PHQ-9 Total Score -- '7 9 9 16      '$ Flowsheet Row Admission (Discharged) from 07/11/2022 in [facility] from 07/06/2022 in [facility] Video Visit from 12/23/2021 in [facility] at [place] No Risk No Risk No Risk        Assessment and Plan: This patient is a 72 year old female with a history of depression anxiety and possible bipolar disorder.  She remains stable on her current regimen.  She will continue Lamictal 100 mg twice daily for mood stabilization, Effexor XR 300 mg daily for depression and temazepam 0.25 mg daily as needed for anxiety or sleep.  She will return to see me in 3 months  Collaboration of Care: Collaboration of Care: Referral or follow-up with counselor/therapist AEB patient will continue to see Maurice Small therapist in our office on an as-needed basis  Patient/Guardian was advised Release of Information must be obtained prior to any record release in order to collaborate their care with an outside provider. Patient/Guardian was advised if they have not already done so to contact the registration department to sign all necessary forms in order for [place] to release information regarding their care.   Consent: Patient/Guardian gives verbal consent for treatment and assignment of benefits for services provided during this visit. Patient/Guardian expressed understanding and agreed to proceed.    Levonne Spiller, MD 11/09/2022, 2:03 PM

## 2022-11-23 ENCOUNTER — Ambulatory Visit (INDEPENDENT_AMBULATORY_CARE_PROVIDER_SITE_OTHER): Payer: Medicare Other | Admitting: Internal Medicine

## 2022-11-23 ENCOUNTER — Encounter: Payer: Self-pay | Admitting: Internal Medicine

## 2022-11-23 VITALS — BP 113/68 | HR 105 | Temp 97.3°F | Ht 66.0 in | Wt 156.3 lb

## 2022-11-23 DIAGNOSIS — K580 Irritable bowel syndrome with diarrhea: Secondary | ICD-10-CM | POA: Diagnosis not present

## 2022-11-23 DIAGNOSIS — D126 Benign neoplasm of colon, unspecified: Secondary | ICD-10-CM

## 2022-11-23 DIAGNOSIS — K219 Gastro-esophageal reflux disease without esophagitis: Secondary | ICD-10-CM

## 2022-11-23 NOTE — Patient Instructions (Signed)
I am happy to hear that you are doing better.  Continue on Dexilant 60 mg daily for your chronic reflux.  If you find a cheaper price at a different pharmacy then let me know and I will send in a new prescription.  For your irritable bowel syndrome and diarrhea continue on dicyclomine as well as as needed Lomotil.  Avoid dairy.  You can try Lactaid over-the-counter and see how you do.  You will be due for surveillance colonoscopy November 2026.  Follow-up in 6 months or sooner if needed.  It was very nice seeing you again today.  Dr. Abbey Chatters  At [facility] we value your feedback. If you would like to leave [place] a Google review, please share your experience as we strive to create trusting relationships with our patients to provide genuine, compassionate, quality care.

## 2022-11-23 NOTE — Progress Notes (Signed)
Referring Provider: Caren Macadam, MD Primary Care Physician:  Caren Macadam, MD Primary GI:  Dr. Abbey Chatters  Chief Complaint  Patient presents with   Diarrhea    Patient here today with complaint of on going diarrhea. Patient says this has worsened since starting methotrexate weekly. She is taking lomotil prn, bentyl Qid, which has helped. Symptoms do worsen when she consumes dairy products.    HPI:   Lauren [name] is a 72 y.o. female who presents to clinic today for follow-up visit.  History of chronic GERD, IBS-D, hepatitis C status post treatment with Harvoni.  History of chronic GERD currently maintained on Dexilant 60 mg daily and doing much better. States it is expensive though approx. 100 dollars a month. Previously on pantoprazole, Nexium and Prilosec without relief.   History of esophageal dysphagia s/p empiric dilation 09/16/2021. This is improved.   Also history of IBS-D, currently on dicyclomine and as needed Lomotil. Overall doing better. Notes nuts and dairy as triggers.   Colonoscopy 07/11/2022 with 15 mm cecal polyp, sessile serrated on pathology.  Recommend repeat 3 years.  Colon biopsies negative for microscopic colitis.  Last EGD 09/16/2020 with small hiatal hernia, findings suspicious for Barrett's esophagus though biopsies negative, duodenal biopsies negative.  Empiric dilation performed up to 18 mm.  Colonoscopy 2016- Prep satisfactory. Normal mucosa of cecum, ascending colon, hepatic flexure, transverse colon, splenic flexure, descending and sigmoid colon. Normal rectal mucosa. Small hemorrhoids below the dentate line.  Past Medical History:  Diagnosis Date   Anxiety    Chronic pain    COPD (chronic obstructive pulmonary disease) (HCC)    Depression    Diabetes mellitus without complication (HCC)    Fibromyalgia    Fibromyalgia    GERD (gastroesophageal reflux disease)    Headache    Hepatitis C    History of blood transfusion    History of  bronchitis    IBS (irritable bowel syndrome)    Osteoarthritis    Pneumonia    Prediabetes    Shortness of breath dyspnea    allergies; increased pain;    Vertigo    Wears glasses     Past Surgical History:  Procedure Laterality Date   BIOPSY  09/16/2020   Procedure: BIOPSY;  Surgeon: Harvel Quale, MD;  [facility]: [facility];  Service: Gastroenterology;;   BIOPSY  07/11/2022   Procedure: BIOPSY;  Surgeon: [name], DO;  Location: [facility];  Service: Endoscopy;;   CATARACT EXTRACTION W/PHACO Right 08/01/2017   Procedure: CATARACT EXTRACTION PHACO AND INTRAOCULAR LENS PLACEMENT RIGHT EYE;  Surgeon: Rutherford Guys, MD;  Location: [facility];  Service: Ophthalmology;  Laterality: Right;  CDE: 3.30   CATARACT EXTRACTION W/PHACO Left 08/15/2017   Procedure: CATARACT EXTRACTION PHACO AND INTRAOCULAR LENS PLACEMENT (IOC);  Surgeon: Rutherford Guys, MD;  Location: [facility];  Service: Ophthalmology;  Laterality: Left;  CDE: 4.00   CHOLECYSTECTOMY     COLONOSCOPY     COLONOSCOPY N/A 10/23/2014   Procedure: COLONOSCOPY;  Surgeon: Rogene Houston, MD;  [facility]: [facility];  Service: Endoscopy;  Laterality: N/A;  1030   COLONOSCOPY WITH PROPOFOL N/A 07/11/2022   Procedure: COLONOSCOPY WITH PROPOFOL;  Surgeon: [name], DO;  [facility]: [facility];  Service: Endoscopy;  Laterality: N/A;  2:30pm, asa 3   ESOPHAGOGASTRODUODENOSCOPY N/A 10/23/2014   Procedure: ESOPHAGOGASTRODUODENOSCOPY (EGD);  Surgeon: Rogene Houston, MD;  [facility]: [facility];  Service: Endoscopy;  Laterality: N/A;  ESOPHAGOGASTRODUODENOSCOPY (EGD) WITH PROPOFOL N/A 09/16/2020   Procedure: ESOPHAGOGASTRODUODENOSCOPY (EGD) WITH PROPOFOL;  Surgeon: Harvel Quale, MD;  [facility]: [facility];  Service: Gastroenterology;  Laterality: N/A;  7:30   KNEE ARTHROSCOPY Left 10/21/2015   Procedure: ARTHROSCOPY LEFT KNEE WITH MENICAL DEBRIDEMENT, Chondroplasty;  Surgeon: Gaynelle Arabian, MD;  Location:  [facility];  Service: Orthopedics;  Laterality: Left;   MANDIBLE SURGERY  09/06/1979   ORIF WRIST FRACTURE Right 07/23/2013   Procedure: OPEN REDUCTION INTERNAL FIXATION (ORIF) WRIST FRACTURE;  Surgeon: Roseanne Kaufman, MD;  Location: [place];  Service: Orthopedics;  Laterality: Right;   SAVORY DILATION  09/16/2020   Procedure: SAVORY DILATION;  Surgeon: Montez Morita, Quillian Quince, MD;  [facility]: [facility];  Service: Gastroenterology;;   TONSILLECTOMY     TOTAL KNEE ARTHROPLASTY     2011 rt knee   TOTAL KNEE ARTHROPLASTY Left 04/16/2018   Procedure: LEFT TOTAL KNEE ARTHROPLASTY;  Surgeon: Gaynelle Arabian, MD;  Location: [facility];  Service: Orthopedics;  Laterality: Left;   UPPER GASTROINTESTINAL ENDOSCOPY      Current Outpatient Medications  Medication Sig Dispense Refill   acetaminophen (TYLENOL) 500 MG tablet Take 500 mg by mouth every 6 (six) hours as needed for moderate pain.     albuterol (VENTOLIN HFA) 108 (90 Base) MCG/ACT inhaler INHALE TWO PUFFS BY MOUTH INTO LUNGS EVERY 4 HOURS AS NEEDED FOR SHORTNESS OF BREATH OR wheezing 8.5 g 3   CALCIUM MAGNESIUM ZINC PO Take 1 tablet by mouth daily.     Cholecalciferol (VITAMIN D-3) 25 MCG (1000 UT) CAPS Take 1,000 Units by mouth daily.     [employer] Omega-3 Krill Oil 350 MG CAPS Take 350 mg by mouth daily.     Cyanocobalamin (B-12 PO) Place 1 drop under the tongue daily.     dexlansoprazole (DEXILANT) 60 MG capsule Take 1 capsule (60 mg total) by mouth daily. 30 capsule 11   diclofenac sodium (VOLTAREN) 1 % GEL Apply 2 g topically 4 (four) times daily as needed (pain).     dicyclomine (BENTYL) 10 MG capsule Take 1 capsule (10 mg total) by mouth 4 (four) times daily -  before meals and at bedtime. 360 capsule 3   diphenoxylate-atropine (LOMOTIL) 2.5-0.025 MG tablet TAKE ONE TABLET BY MOUTH THREE TIMES DAILY AS NEEDED 60 tablet 1   fluticasone (FLONASE) 50 MCG/ACT nasal spray Place 2 sprays into both nostrils in the morning and at bedtime.     gabapentin  (NEURONTIN) 100 MG capsule Take 200-300 mg by mouth See admin instructions. Take 200mg  by mouth every day in the morning and take 300mg  by mouth at bedtime     Glucosamine-Chondroitin (GLUCOSAMINE CHONDR COMPLEX PO) Take 6 tablets by mouth daily. Glucosamine 1500mg  and Chondroitin sulfate 1200mg      lamoTRIgine (LAMICTAL) 100 MG tablet Take 1 tablet (100 mg total) by mouth 2 (two) times daily. 180 tablet 2   LORazepam (ATIVAN) 0.5 MG tablet Take 0.5 tablets (0.25 mg total) by mouth daily. 15 tablet 2   losartan (COZAAR) 50 MG tablet Take by mouth daily. Taking 1.5 Tablets Daily     METHOTREXATE PO Take 6 tablets by mouth once a week.     metroNIDAZOLE (METROGEL) 1 % gel Apply 1 application  topically at bedtime.     montelukast (SINGULAIR) 10 MG tablet TAKE ONE TABLET BY MOUTH EVERYDAY AT BEDTIME 30 tablet 5   Multiple Vitamins-Minerals (HAIR SKIN AND NAILS FORMULA) TABS Take 2 tablets by mouth daily.  rosuvastatin (CRESTOR) 20 MG tablet Take 20 mg by mouth daily.     TRELEGY ELLIPTA 200-62.5-25 MCG/ACT AEPB INHALE 1 PUFF BY MOUTH INTO LUNGS ONCE DAILY 60 each 11   Turmeric (QC TUMERIC COMPLEX) 500 MG CAPS Take 500 mg by mouth daily.     venlafaxine XR (EFFEXOR-XR) 150 MG 24 hr capsule TAKE TWO CAPSULES BY MOUTH ONCE DAILY 60 capsule 2   vitamin E 400 UNIT capsule Take 400 Units daily by mouth.     No current facility-administered medications for this visit.    Allergies as of 11/23/2022 - Review Complete 11/23/2022  Allergen Reaction Noted   Elavil [amitriptyline] Other (See Comments) 10/10/2012   Flagyl [metronidazole] Itching 02/14/2012   Vistaril [hydroxyzine hcl] Other (See Comments) 07/25/2012   Geodon [ziprasidone hydrochloride] Other (See Comments) 07/19/2011   Lactose intolerance (gi) Other (See Comments) 07/19/2011   Latex Other (See Comments) 10/12/2015   Other Other (See Comments) 10/19/2020    Family History  Problem Relation Age of Onset   Alcohol abuse Father     Diabetes Father    Stroke Father    Depression Father    Hypertension Father    Heart attack Father    High Cholesterol Father    Alcohol abuse Maternal Grandfather    Alcohol abuse Maternal Grandmother    Hypertension Maternal Grandmother    Alcohol abuse Paternal Grandfather    Alcohol abuse Paternal Grandmother    Diabetes Paternal Grandmother    Stroke Mother    Irritable bowel syndrome Mother    Sexual abuse Mother    Hypertension Mother    Breast cancer Mother    Diabetes Brother    Healthy Brother    Diabetes Brother    Heart disease Brother    Sarcoidosis Brother    Heart failure Brother    Anxiety disorder Paternal Uncle    Alcohol abuse Paternal Uncle    Alcohol abuse Cousin    Anxiety disorder Maternal Uncle    ADD / ADHD Neg Hx    Bipolar disorder Neg Hx    Dementia Neg Hx    Drug abuse Neg Hx    Paranoid behavior Neg Hx    Schizophrenia Neg Hx    Seizures Neg Hx    Physical abuse Neg Hx     Social History   Socioeconomic History   Marital status: Widowed    Spouse name: Not on file   Number of children: Not on file   Years of education: Not on file   Highest education level: Not on file  Occupational History   Not on file  Tobacco Use   Smoking status: Former    Packs/day: 2.00    Years: 15.00    Additional pack years: 0.00    Total pack years: 30.00    Types: Cigarettes    Quit date: 09/05/1982    Years since quitting: 40.2   Smokeless tobacco: Never  Vaping Use   Vaping Use: Never used  Substance and Sexual Activity   Alcohol use: No    Alcohol/week: 0.0 standard drinks of alcohol    Comment: history of alcoholism; pt states has been sober 28 years   Drug use: No   Sexual activity: Never  Other Topics Concern   Not on file  Social History Narrative   Not on file   Social Determinants of Health   Financial Resource Strain: Not on file  Food Insecurity: Not on file  Transportation Needs: Not on file  Physical Activity: Not on file   Stress: Not on file  Social Connections: Not on file    Subjective: Review of Systems  Constitutional:  Negative for chills and fever.  HENT:  Negative for congestion and hearing loss.   Eyes:  Negative for blurred vision and double vision.  Respiratory:  Negative for cough and shortness of breath.   Cardiovascular:  Negative for chest pain and palpitations.  Gastrointestinal:  Positive for diarrhea and heartburn. Negative for blood in stool, constipation, melena and vomiting.  Genitourinary:  Negative for dysuria and urgency.  Musculoskeletal:  Negative for joint pain and myalgias.  Skin:  Negative for itching and rash.  Neurological:  Negative for dizziness and headaches.  Psychiatric/Behavioral:  Negative for depression. The patient is not nervous/anxious.      Objective: BP 113/68 (BP Location: Left Arm, Patient Position: Sitting, Cuff Size: Large)   Pulse (!) 105   Temp (!) 97.3 F (36.3 C) (Temporal)   Ht 5\' 6"  (1.676 m)   Wt 156 lb 4.8 oz (70.9 kg)   BMI 25.23 kg/m  Physical Exam Constitutional:      Appearance: Normal appearance.  HENT:     Head: Normocephalic and atraumatic.  Eyes:     Extraocular Movements: Extraocular movements intact.     Conjunctiva/sclera: Conjunctivae normal.  Cardiovascular:     Rate and Rhythm: Normal rate and regular rhythm.     Heart sounds: Murmur heard.  Pulmonary:     Effort: Pulmonary effort is normal.     Breath sounds: Normal breath sounds.  Abdominal:     General: Bowel sounds are normal.     Palpations: Abdomen is soft.  Musculoskeletal:        General: No swelling. Normal range of motion.     Cervical back: Normal range of motion and neck supple.  Skin:    General: Skin is warm and dry.     Coloration: Skin is not jaundiced.  Neurological:     General: No focal deficit present.     Mental Status: She is alert and oriented to person, place, and time.  Psychiatric:        Mood and Affect: Mood normal.         Behavior: Behavior normal.      Assessment: *Chronic GERD-well controlled *Irritable bowel syndrome with diarrhea-improved *Sessile serrated polyp  Plan: Chronic GERD currently maintained on Dexilant 60 mg daily and doing much better. States it is expensive though approx. 100 dollars a month. Previously on pantoprazole, Nexium and Prilosec without relief. Continue on Dexilasnt 60 mg daily. Recommended she shop around different pharmacies to see if she can get a better price. Consider Voquezna if stops helping.   Colonoscopy for surveillance due 07/2025  IBS D improved. Continue dicyclomine and as needed Lomotil. Avoid Dairy, can use OTC Lactaid if needed.   Follow up in 6 months.   11/23/2022 10:39 AM   Disclaimer: This note was dictated with voice recognition software. Similar sounding words can inadvertently be transcribed and may not be corrected upon review.

## 2022-11-29 DIAGNOSIS — M1990 Unspecified osteoarthritis, unspecified site: Secondary | ICD-10-CM | POA: Diagnosis not present

## 2022-11-29 DIAGNOSIS — M25539 Pain in unspecified wrist: Secondary | ICD-10-CM | POA: Diagnosis not present

## 2022-11-29 DIAGNOSIS — E119 Type 2 diabetes mellitus without complications: Secondary | ICD-10-CM | POA: Diagnosis not present

## 2022-11-29 DIAGNOSIS — M810 Age-related osteoporosis without current pathological fracture: Secondary | ICD-10-CM | POA: Diagnosis not present

## 2022-11-29 DIAGNOSIS — M1380 Other specified arthritis, unspecified site: Secondary | ICD-10-CM | POA: Diagnosis not present

## 2022-11-29 DIAGNOSIS — Z79899 Other long term (current) drug therapy: Secondary | ICD-10-CM | POA: Diagnosis not present

## 2022-12-01 ENCOUNTER — Ambulatory Visit (HOSPITAL_COMMUNITY): Payer: Medicare Other | Admitting: Psychiatry

## 2022-12-16 DIAGNOSIS — J342 Deviated nasal septum: Secondary | ICD-10-CM | POA: Diagnosis not present

## 2022-12-16 DIAGNOSIS — J320 Chronic maxillary sinusitis: Secondary | ICD-10-CM | POA: Diagnosis not present

## 2022-12-16 DIAGNOSIS — J309 Allergic rhinitis, unspecified: Secondary | ICD-10-CM | POA: Diagnosis not present

## 2022-12-22 ENCOUNTER — Ambulatory Visit (INDEPENDENT_AMBULATORY_CARE_PROVIDER_SITE_OTHER): Payer: Medicare Other | Admitting: Psychiatry

## 2022-12-22 DIAGNOSIS — F332 Major depressive disorder, recurrent severe without psychotic features: Secondary | ICD-10-CM

## 2022-12-22 NOTE — Progress Notes (Signed)
               IN-PERSON     THERAPIST PRORESS NOTE    Session Time:     12/22/2022 2:10 PM - 3:00 PM   Participation Level: Active  Behavioral Response: CasualAlert/anxious  Type of Therapy: Individual Therapy  Treatment Goals addressed: Patient will score less than 10 on the patient health questionnaire, patient will identify 3 cognitive patterns and beliefs that support depression  Progress on Goals: Progressing  Interventions: CBT and Supportive  Summary: Lauren [name] is a 72 y.o. female who is referred for services due to stress, anxiety, and depression.  She has participated in therapy intermittently for the past 25 years.  She reports 3 psychiatric hospitalizations with the last 1 occurring over 5 years ago at [facility].  Patient continues to deal with grief and loss issues regarding the death of her husband last year.  She also reports ruminations, difficulty completing tasks at home, depressed mood, and irritability.  She reports staying at home more as she has memory difficulty and is embarrassed about this.  She also reports fears that she may have to go to a nursing home should she have dementia.  She is scheduled for a follow-up appointment with psychologist regarding recent cognitive testing.                      Patient last was seen via virtual visit about 8-9 weeks ago. She reports continued minimal symptoms of depression.  She reports experiencing sadness around her birthday earlier this month and found herself beginning to isolate.  However, patient reports recognizing early warning signs and intervening successfully.  She is pleased she initiated efforts to increase socialization and had already made plans to avoid isolation.  Patient is pleased regarding her progress in coping with depression and is able to identify/challenge/and replace thoughts triggering depression.  However, she reports significant difficulty with anxiety and worry.  She reports  procrastinating and avoidant behaviors.  She is particularly anxious today as she is planning to attend her step grandson's wedding in a couple of weeks.     Suicidal/Homicidal: Nowithout intent/plan    Therapist Response: reviewed symptoms, praised and reinforced patient's recognition of early warning signs of a lapse of depression and her successful efforts to intervene, discussed stressors, facilitated expression of thoughts and feelings, validated feelings, reviewed anxiety/stress response, reviewed ways to trigger relaxation response, developed plan with patient to review material on mindfulness and the window of tolerance and to practice grounding techniques, discussed continuing treatment to address anxiety, developed plan with patient to identify goals she would like to pursue regarding managing anxiety in preparation for next session.   Diagnosis: Axis I: MDD, Recurrent, GAD         Collaboration of Care: AEB sees psychiatrist Dr. Tenny Craw in this practice for medication management  Patient/Guardian was advised Release of Information must be obtained prior to any record release in order to collaborate their care with an outside provider. Patient/Guardian was advised if they have not already done so to contact the registration department to sign all necessary forms in order for [place] to release information regarding their care.   Consent: Patient/Guardian gives verbal consent for treatment and assignment of benefits for services provided during this visit. Patient/Guardian expressed understanding and agreed to proceed.        Florencia Reasons, LCSW

## 2022-12-23 ENCOUNTER — Other Ambulatory Visit: Payer: Self-pay | Admitting: Pulmonary Disease

## 2022-12-29 DIAGNOSIS — D225 Melanocytic nevi of trunk: Secondary | ICD-10-CM | POA: Diagnosis not present

## 2022-12-29 DIAGNOSIS — L218 Other seborrheic dermatitis: Secondary | ICD-10-CM | POA: Diagnosis not present

## 2022-12-29 DIAGNOSIS — Z1283 Encounter for screening for malignant neoplasm of skin: Secondary | ICD-10-CM | POA: Diagnosis not present

## 2023-01-18 ENCOUNTER — Ambulatory Visit (INDEPENDENT_AMBULATORY_CARE_PROVIDER_SITE_OTHER): Payer: Medicare Other | Admitting: Psychiatry

## 2023-01-18 DIAGNOSIS — F332 Major depressive disorder, recurrent severe without psychotic features: Secondary | ICD-10-CM

## 2023-01-18 DIAGNOSIS — F411 Generalized anxiety disorder: Secondary | ICD-10-CM | POA: Diagnosis not present

## 2023-01-18 NOTE — Progress Notes (Unsigned)
               IN-PERSON     THERAPIST PRORESS NOTE    Session Time:     01/18/2023 2:10 PM -  Participation Level: Active  Behavioral Response: CasualAlert/anxious  Type of Therapy: Individual Therapy  Treatment Goals addressed: Patient will score less than 10 on the patient health questionnaire, patient will identify 3 cognitive patterns and beliefs that support depression  Progress on Goals: Progressing  Interventions: CBT and Supportive  Summary: Lauren [name] is a 72 y.o. female who is referred for services due to stress, anxiety, and depression.  She has participated in therapy intermittently for the past 25 years.  She reports 3 psychiatric hospitalizations with the last 1 occurring over 5 years ago at [facility].  Patient continues to deal with grief and loss issues regarding the death of her husband last year.  She also reports ruminations, difficulty completing tasks at home, depressed mood, and irritability.  She reports staying at home more as she has memory difficulty and is embarrassed about this.  She also reports fears that she may have to go to a nursing home should she have dementia.  She is scheduled for a follow-up appointment with psychologist regarding recent cognitive testing.                      Patient last was seen via virtual visit about 4            weeks ago. She reports continued minimal symptoms of depression.  She reports experiencing sadness around her birthday earlier this month and found herself beginning to isolate.  However, patient reports recognizing early warning signs and intervening successfully.  She is pleased she initiated efforts to increase socialization and had already made plans to avoid isolation.  Patient is pleased regarding her progress in coping with depression and is able to identify/challenge/and replace thoughts triggering depression.  However, she reports significant difficulty with anxiety and worry.  She reports  procrastinating and avoidant behaviors.  She is particularly anxious today as she is planning to attend her step grandson's wedding in a couple of weeks.     Suicidal/Homicidal: Nowithout intent/plan    Therapist Response: reviewed symptoms, praised and reinforced patient's recognition of early warning signs of a lapse of depression and her successful efforts to intervene, discussed stressors, facilitated expression of thoughts and feelings, validated feelings, reviewed anxiety/stress response, reviewed ways to trigger relaxation response, developed plan with patient to review material on mindfulness and the window of tolerance and to practice grounding techniques, discussed continuing treatment to address anxiety, developed plan with patient to identify goals she would like to pursue regarding managing anxiety in preparation for next session.   Diagnosis: Axis I: MDD, Recurrent, GAD         Collaboration of Care: AEB sees psychiatrist Dr. Tenny Craw in this practice for medication management  Patient/Guardian was advised Release of Information must be obtained prior to any record release in order to collaborate their care with an outside provider. Patient/Guardian was advised if they have not already done so to contact the registration department to sign all necessary forms in order for [place] to release information regarding their care.   Consent: Patient/Guardian gives verbal consent for treatment and assignment of benefits for services provided during this visit. Patient/Guardian expressed understanding and agreed to proceed.        Florencia Reasons, LCSW

## 2023-01-27 ENCOUNTER — Ambulatory Visit (INDEPENDENT_AMBULATORY_CARE_PROVIDER_SITE_OTHER): Payer: Medicare Other | Admitting: Allergy & Immunology

## 2023-01-27 ENCOUNTER — Encounter: Payer: Self-pay | Admitting: Allergy & Immunology

## 2023-01-27 ENCOUNTER — Other Ambulatory Visit: Payer: Self-pay

## 2023-01-27 VITALS — BP 130/70 | HR 101 | Temp 97.7°F | Resp 20 | Ht 66.0 in | Wt 155.0 lb

## 2023-01-27 DIAGNOSIS — F32A Depression, unspecified: Secondary | ICD-10-CM

## 2023-01-27 DIAGNOSIS — L719 Rosacea, unspecified: Secondary | ICD-10-CM | POA: Diagnosis not present

## 2023-01-27 DIAGNOSIS — J310 Chronic rhinitis: Secondary | ICD-10-CM | POA: Diagnosis not present

## 2023-01-27 DIAGNOSIS — K582 Mixed irritable bowel syndrome: Secondary | ICD-10-CM | POA: Diagnosis not present

## 2023-01-27 DIAGNOSIS — J4489 Other specified chronic obstructive pulmonary disease: Secondary | ICD-10-CM | POA: Diagnosis not present

## 2023-01-27 NOTE — Patient Instructions (Addendum)
1. Chronic rhinitis - Since your insurance will not cover testing on the same day, we will see you for a testing visit. - Continue to hold the antihistamines until we can get you in. - We will come up with a good plan after that!  - You can remain on the Flonase for the testing.   2. Return in about 5 days (around 02/01/2023) for SKIN TESTING. You can have the follow up appointment with Dr. Dellis Anes or a Nurse Practicioner (our Nurse Practitioners are excellent and always have Physician oversight!).    Please inform us of any Emergency Department visits, hospitalizations, or changes in symptoms. Call us before going to the ED for breathing or allergy symptoms since we might be able to fit you in for a sick visit. Feel free to contact us anytime with any questions, problems, or concerns.  It was a pleasure to meet you today!  Websites that have reliable patient information: 1. American Academy of Asthma, Allergy, and Immunology: www.aaaai.org 2. Food Allergy Research and Education (FARE): foodallergy.org 3. Mothers of Asthmatics: http://www.asthmacommunitynetwork.org 4. American College of Allergy, Asthma, and Immunology: www.acaai.org   COVID-19 Vaccine Information can be found at: [email or web address].[email or web address] For questions related to vaccine distribution or appointments, please email [email or web address] or call [phone].   We realize that you might be concerned about having an allergic reaction to the COVID19 vaccines. To help with that concern, WE ARE OFFERING THE COVID19 VACCINES IN OUR OFFICE! Ask the front desk for dates!     "Like" [place] on Facebook and Instagram for our latest updates!      A healthy democracy works best when [employer] participate! Make sure you are registered to vote! If you have moved or changed any of your contact information, you will need to get this updated before voting!  In some cases, you MAY be  able to register to vote online: [email or web address]

## 2023-01-27 NOTE — Progress Notes (Signed)
NEW PATIENT  Date of Service/Encounter:  01/28/23  Consult requested by: Aliene Beams, MD   Assessment:   Chronic rhinitis  Asthma-COPD overlap syndrome  Irritable bowel syndrome   Rosacea  Depression   Plan/Recommendations:   1. Chronic rhinitis - Since your insurance will not cover testing on the same day, we will see you for a testing visit. - Continue to hold the antihistamines until we can get you in. - We will come up with a good plan after that!  - You can remain on the Flonase for the testing.   2. Return in about 5 days (around 02/01/2023) for SKIN TESTING. You can have the follow up appointment with Dr. Dellis Anes or a Nurse Practicioner (our Nurse Practitioners are excellent and always have Physician oversight!).    This note in its entirety was forwarded to the Provider who requested this consultation.  Subjective:   [name] is a 72 y.o. female presenting today for evaluation of  Chief Complaint  Patient presents with   Advice Only    Seasonal and Perennial Allergic Rhinitis Asthma    Lauren [name] has a history of the following: Patient Active Problem List   Diagnosis Date Noted   Dysphagia 08/06/2020   Shock (HCC) 09/24/2019   Anxiety    COPD (chronic obstructive pulmonary disease) (HCC)    AKI (acute kidney injury) (HCC)    Acute medial meniscal tear 10/21/2015   GERD (gastroesophageal reflux disease) 01/21/2015   Fibromyalgia 09/11/2014   MDD (major depressive disorder) 03/19/2014   Pain 07/25/2012   Insomnia due to mental disorder 07/25/2012   OA (osteoarthritis) of knee 02/14/2012   Hepatitis C 02/14/2012   IBS (irritable bowel syndrome) 02/14/2012   Depression 12/27/2011    History obtained from: chart review and patient.  Lauren [name] was referred by Aliene Beams, MD.     Lauren [name] is a 72 y.o. female presenting for an evaluation of environmental allergies . She was seen by Dr. Marene Lenz for evaluation of  deviated septum and nasal congesiton. She had a sinus CT that showed a deviated septum.  She also had a sinus CT by Dr. Suszanne Conners that demonstrated complete opacification of the right maxillary sinus with chronic reactive osteitis and opacification of the right OMU, right concha bullosa, and mild right nasal septal deviation.  She did have a nasal endoscopy in March 2024.  It showed nasal cavities that were patent with a small bony spur along the floor of the nose on the left.  There was a left maxillary os visible.  No mucopurulent drainage.  Septum was deviated to the right, with thin mucopurulent drainage coming from the right maxillary sinus.  She has a history of recurrent sinusitis, going back several years. She tells me that she has been on cetirizine and this just knocks her out.  She has received multiple courses of antibiotics over the last several years.  She has symptoms that worse in the spring season, but persist all year long. She has mostly right sided congestion symptoms. This is blocked up frequently.  She uses cetirizine as well as Flonase.  She thinks she might have been tested at some point in her life, but she does not remember the details or where she was tested.   Asthma/Respiratory Symptom History: She does have a history of COPD listed in a couple of places, but severe persistent asthma listed with Dr. Craige Cotta.  She was seeing Dr. Craige Cotta and was on Trelegy.  I did not see any interaction with their office since a phone call in 2023.  She was having trouble affording her medications at that time.  Skin Symptom History: She has rosacea and does not take anything routinely.  Metrogel tas needed. She has something for eczema in her hair.  But she cannot remember the name right now.  She has a history of irritable bowel syndrome as well as major depressive disorder and seronegative arthritis.  She also has type 2 diabetes.  She sees Dr. Levon Hedger for GI.  She sees Adrienne Mocha, LCSW and  Diannia Ruder, MD for her depression.  Otherwise, there is no history of other atopic diseases, including drug allergies, stinging insect allergies, or contact dermatitis. There is no significant infectious history. Vaccinations are up to date.    Past Medical History: Patient Active Problem List   Diagnosis Date Noted   Dysphagia 08/06/2020   Shock (HCC) 09/24/2019   Anxiety    COPD (chronic obstructive pulmonary disease) (HCC)    AKI (acute kidney injury) (HCC)    Acute medial meniscal tear 10/21/2015   GERD (gastroesophageal reflux disease) 01/21/2015   Fibromyalgia 09/11/2014   MDD (major depressive disorder) 03/19/2014   Pain 07/25/2012   Insomnia due to mental disorder 07/25/2012   OA (osteoarthritis) of knee 02/14/2012   Hepatitis C 02/14/2012   IBS (irritable bowel syndrome) 02/14/2012   Depression 12/27/2011    Medication List:  Allergies as of 01/27/2023       Reactions   Elavil [amitriptyline] Other (See Comments)   Vivid dreams and almost suicidal   Flagyl [metronidazole] Itching   Vistaril [hydroxyzine Hcl] Other (See Comments)   Got higher than a kite on too much of this.   Geodon [ziprasidone Hydrochloride] Other (See Comments)   Blacked out   Lactose Intolerance (gi) Other (See Comments)   GI upset   Latex Other (See Comments)   Red at site   Other Other (See Comments)        Medication List        Accurate as of Jan 27, 2023 11:59 PM. If you have any questions, ask your nurse or doctor.          acetaminophen 500 MG tablet Commonly known as: TYLENOL Take 500 mg by mouth every 6 (six) hours as needed for moderate pain.   albuterol 108 (90 Base) MCG/ACT inhaler Commonly known as: VENTOLIN HFA INHALE TWO PUFFS BY MOUTH INTO LUNGS EVERY 4 HOURS AS NEEDED FOR SHORTNESS OF BREATH OR wheezing   B-12 PO Place 1 drop under the tongue daily.   CALCIUM MAGNESIUM ZINC PO Take 1 tablet by mouth daily.   clobetasol 0.05 % external  solution Commonly known as: TEMOVATE Apply 1 Application topically 2 (two) times daily as needed.   [employer] Omega-3 Krill Oil 350 MG Caps Take 350 mg by mouth daily.   dexlansoprazole 60 MG capsule Commonly known as: DEXILANT Take 1 capsule (60 mg total) by mouth daily.   diclofenac sodium 1 % Gel Commonly known as: VOLTAREN Apply 2 g topically 4 (four) times daily as needed (pain).   dicyclomine 10 MG capsule Commonly known as: BENTYL Take 1 capsule (10 mg total) by mouth 4 (four) times daily -  before meals and at bedtime.   diphenoxylate-atropine 2.5-0.025 MG tablet Commonly known as: LOMOTIL TAKE ONE TABLET BY MOUTH THREE TIMES DAILY AS NEEDED   fluticasone 50 MCG/ACT nasal spray Commonly known as: FLONASE Place 2 sprays into both  nostrils in the morning and at bedtime.   folic acid 1 MG tablet Commonly known as: FOLVITE Take 1 mg by mouth daily.   gabapentin 100 MG capsule Commonly known as: NEURONTIN Take 200-300 mg by mouth See admin instructions. Take 200mg  by mouth every day in the morning and take 300mg  by mouth at bedtime   GLUCOSAMINE CHONDR COMPLEX PO Take 6 tablets by mouth daily. Glucosamine 1500mg  and Chondroitin sulfate 1200mg    Hair Skin and Nails Formula Tabs Take 2 tablets by mouth daily.   lamoTRIgine 100 MG tablet Commonly known as: LaMICtal Take 1 tablet (100 mg total) by mouth 2 (two) times daily.   LORazepam 0.5 MG tablet Commonly known as: Ativan Take 0.5 tablets (0.25 mg total) by mouth daily.   losartan 50 MG tablet Commonly known as: COZAAR Take by mouth daily. Taking 1.5 Tablets Daily   METHOTREXATE PO Take 6 tablets by mouth once a week.   metroNIDAZOLE 1 % gel Commonly known as: METROGEL Apply 1 application  topically at bedtime.   montelukast 10 MG tablet Commonly known as: SINGULAIR TAKE ONE TABLET BY MOUTH EVERYDAY AT BEDTIME   OneTouch Verio test strip Generic drug: glucose blood 1 each by Other route as needed.   QC  Tumeric Complex 500 MG Caps Generic drug: Turmeric Take 500 mg by mouth daily.   rosuvastatin 20 MG tablet Commonly known as: CRESTOR Take 20 mg by mouth daily.   Trelegy Ellipta 200-62.5-25 MCG/ACT Aepb Generic drug: Fluticasone-Umeclidin-Vilant INHALE 1 PUFF BY MOUTH INTO LUNGS ONCE DAILY   venlafaxine XR 150 MG 24 hr capsule Commonly known as: EFFEXOR-XR TAKE TWO CAPSULES BY MOUTH ONCE DAILY   Vitamin D-3 25 MCG (1000 UT) Caps Take 1,000 Units by mouth daily.   vitamin E 180 MG (400 UNITS) capsule Take 400 Units daily by mouth.        Birth History: non-contributory  Developmental History: non-contributory  Past Surgical History: Past Surgical History:  Procedure Laterality Date   BIOPSY  09/16/2020   Procedure: BIOPSY;  Surgeon: Dolores Frame, MD;  [facility];  Service: Gastroenterology;;   BIOPSY  07/11/2022   Procedure: BIOPSY;  Surgeon: Lanelle Bal, DO;  [facility]: [facility];  Service: Endoscopy;;   CATARACT EXTRACTION W/PHACO Right 08/01/2017   Procedure: CATARACT EXTRACTION PHACO AND INTRAOCULAR LENS PLACEMENT RIGHT EYE;  Surgeon: Jethro Bolus, MD;  Location: [facility];  Service: Ophthalmology;  Laterality: Right;  CDE: 3.30   CATARACT EXTRACTION W/PHACO Left 08/15/2017   Procedure: CATARACT EXTRACTION PHACO AND INTRAOCULAR LENS PLACEMENT (IOC);  Surgeon: Jethro Bolus, MD;  Location: [facility];  Service: Ophthalmology;  Laterality: Left;  CDE: 4.00   CHOLECYSTECTOMY     COLONOSCOPY     COLONOSCOPY N/A 10/23/2014   Procedure: COLONOSCOPY;  Surgeon: Malissa Hippo, MD;  [facility];  Service: Endoscopy;  Laterality: N/A;  1030   COLONOSCOPY WITH PROPOFOL N/A 07/11/2022   Procedure: COLONOSCOPY WITH PROPOFOL;  Surgeon: Lanelle Bal, DO;  [facility];  Service: Endoscopy;  Laterality: N/A;  2:30pm, asa 3   ESOPHAGOGASTRODUODENOSCOPY N/A 10/23/2014   Procedure: ESOPHAGOGASTRODUODENOSCOPY (EGD);  Surgeon: Malissa Hippo, MD;  [facility];  Service: Endoscopy;  Laterality: N/A;   ESOPHAGOGASTRODUODENOSCOPY (EGD) WITH PROPOFOL N/A 09/16/2020   Procedure: ESOPHAGOGASTRODUODENOSCOPY (EGD) WITH PROPOFOL;  Surgeon: Dolores Frame, MD;  [facility];  Service: Gastroenterology;  Laterality: N/A;  7:30   KNEE ARTHROSCOPY Left 10/21/2015  Procedure: ARTHROSCOPY LEFT KNEE WITH MENICAL DEBRIDEMENT, Chondroplasty;  Surgeon: Ollen Gross, MD;  Location: [facility];  Service: Orthopedics;  Laterality: Left;   MANDIBLE SURGERY  09/06/1979   ORIF WRIST FRACTURE Right 07/23/2013   Procedure: OPEN REDUCTION INTERNAL FIXATION (ORIF) WRIST FRACTURE;  Surgeon: Dominica Severin, MD;  Location: [facility];  Service: Orthopedics;  Laterality: Right;   SAVORY DILATION  09/16/2020   Procedure: SAVORY DILATION;  Surgeon: Marguerita Merles, Reuel Boom, MD;  [facility];  Service: Gastroenterology;;   TONSILLECTOMY     TOTAL KNEE ARTHROPLASTY     2011 rt knee   TOTAL KNEE ARTHROPLASTY Left 04/16/2018   Procedure: LEFT TOTAL KNEE ARTHROPLASTY;  Surgeon: Ollen Gross, MD;  Location: [facility];  Service: Orthopedics;  Laterality: Left;   UPPER GASTROINTESTINAL ENDOSCOPY       Family History: Family History  Problem Relation Age of Onset   Alcohol abuse Father    Diabetes Father    Stroke Father    Depression Father    Hypertension Father    Heart attack Father    High Cholesterol Father    Alcohol abuse Maternal Grandfather    Alcohol abuse Maternal Grandmother    Hypertension Maternal Grandmother    Alcohol abuse Paternal Grandfather    Alcohol abuse Paternal Grandmother    Diabetes Paternal Grandmother    Stroke Mother    Irritable bowel syndrome Mother    Sexual abuse Mother    Hypertension Mother    Breast cancer Mother    Diabetes Brother    Healthy Brother    Diabetes Brother    Heart disease Brother    Sarcoidosis Brother    Heart failure Brother    Anxiety disorder Paternal  Uncle    Alcohol abuse Paternal Uncle    Alcohol abuse Cousin    Anxiety disorder Maternal Uncle    ADD / ADHD Neg Hx    Bipolar disorder Neg Hx    Dementia Neg Hx    Drug abuse Neg Hx    Paranoid behavior Neg Hx    Schizophrenia Neg Hx    Seizures Neg Hx    Physical abuse Neg Hx      Social History: Cythnia lives at home with her family.  She lives in a house that is around 73 years old.  There is hardwood throughout the home.  They have electric heating and a heat pump for cooling.  There is a dog inside of the home as well as outside of the home.  There are no dust mite covers on the bedding.  There is no current tobacco exposure.  She is currently retired.  She last smoked when she was in her 30s.   Review of Systems  Constitutional: Negative.  Negative for chills, fever, malaise/fatigue and weight loss.  HENT:  Positive for congestion. Negative for ear discharge and ear pain.   Eyes:  Negative for pain, discharge and redness.  Respiratory:  Positive for cough and shortness of breath. Negative for sputum production and wheezing.   Cardiovascular: Negative.  Negative for chest pain and palpitations.  Gastrointestinal:  Negative for abdominal pain, constipation, diarrhea, heartburn, nausea and vomiting.  Skin: Negative.  Negative for itching and rash.  Neurological:  Negative for dizziness and headaches.  Endo/Heme/Allergies:  Positive for environmental allergies. Does not bruise/bleed easily.       Objective:   Blood pressure 130/70, pulse (!) 101, temperature 97.7 F (36.5 C), temperature source Temporal, resp. rate 20, height  5\' 6"  (1.676 m), weight 155 lb (70.3 kg), SpO2 94 %. Body mass index is 25.02 kg/m.     Physical Exam Vitals reviewed.  Constitutional:      Appearance: She is well-developed.  HENT:     Head: Normocephalic and atraumatic.     Right Ear: Tympanic membrane, ear canal and external ear normal. No drainage, swelling or tenderness. Tympanic  membrane is not injected, scarred, erythematous, retracted or bulging.     Left Ear: Tympanic membrane, ear canal and external ear normal. No drainage, swelling or tenderness. Tympanic membrane is not injected, scarred, erythematous, retracted or bulging.     Nose: Mucosal edema and rhinorrhea present. No nasal deformity or septal deviation.     Right Turbinates: Enlarged, swollen and pale.     Left Turbinates: Enlarged, swollen and pale.     Right Sinus: No maxillary sinus tenderness or frontal sinus tenderness.     Left Sinus: No maxillary sinus tenderness or frontal sinus tenderness.     Mouth/Throat:     Mouth: Mucous membranes are not pale and not dry.     Pharynx: Uvula midline.  Eyes:     General:        Right eye: No discharge.        Left eye: No discharge.     Conjunctiva/sclera: Conjunctivae normal.     Right eye: Right conjunctiva is not injected. No chemosis.    Left eye: Left conjunctiva is not injected. No chemosis.    Pupils: Pupils are equal, round, and reactive to light.  Cardiovascular:     Rate and Rhythm: Normal rate and regular rhythm.     Heart sounds: Normal heart sounds.  Pulmonary:     Effort: Pulmonary effort is normal. No tachypnea, accessory muscle usage or respiratory distress.     Breath sounds: Normal breath sounds. No wheezing, rhonchi or rales.  Chest:     Chest wall: No tenderness.  Abdominal:     Tenderness: There is no abdominal tenderness. There is no guarding or rebound.  Lymphadenopathy:     Head:     Right side of head: No submandibular, tonsillar or occipital adenopathy.     Left side of head: No submandibular, tonsillar or occipital adenopathy.     Cervical: No cervical adenopathy.  Skin:    Coloration: Skin is not pale.     Findings: No abrasion, erythema, petechiae or rash. Rash is not papular, urticarial or vesicular.  Neurological:     Mental Status: She is alert.  Psychiatric:        Behavior: Behavior is cooperative.       Diagnostic studies: deferred due to insurance stipulations that refuse to pay for testing at initial visits, making it more difficult for patients to get the care they need           Malachi Bonds, MD [facility]

## 2023-01-28 ENCOUNTER — Encounter: Payer: Self-pay | Admitting: Allergy & Immunology

## 2023-01-31 DIAGNOSIS — M25539 Pain in unspecified wrist: Secondary | ICD-10-CM | POA: Diagnosis not present

## 2023-01-31 DIAGNOSIS — M1380 Other specified arthritis, unspecified site: Secondary | ICD-10-CM | POA: Diagnosis not present

## 2023-01-31 DIAGNOSIS — M1990 Unspecified osteoarthritis, unspecified site: Secondary | ICD-10-CM | POA: Diagnosis not present

## 2023-01-31 DIAGNOSIS — Z79899 Other long term (current) drug therapy: Secondary | ICD-10-CM | POA: Diagnosis not present

## 2023-01-31 DIAGNOSIS — M810 Age-related osteoporosis without current pathological fracture: Secondary | ICD-10-CM | POA: Diagnosis not present

## 2023-01-31 DIAGNOSIS — E119 Type 2 diabetes mellitus without complications: Secondary | ICD-10-CM | POA: Diagnosis not present

## 2023-02-02 NOTE — Patient Instructions (Signed)
Allergic rhinitis Your skin testing was positive to indoor molds, outdoor molds, dog, and cat Allergen avoidance measures are listed below Continue an antihistamine once a day as needed for a runny nose or itch Continue Flonase 2 sprays in each nostril once a day as needed for a stuffy nose.  In the right nostril, point the applicator out toward the right ear. In the left nostril, point the applicator out toward the left ear Consider saline nasal rinses as needed for nasal symptoms. Use this before any medicated nasal sprays for best result  Asthma/COPD overlap Your spirometry was normal today Continue montelukast 10 mg once a day to prevent cough or wheeze Continue Trelegy 200-1 puff once a day to prevent cough or wheeze Continue albuterol 2 puffs once every 4 hours as needed for cough or wheeze You may use albuterol 2 puffs 15 minutes before activity to decrease cough or wheeze  Reflux Continue dietary and lifestyle modifications as listed below  Call the clinic if this treatment plan is not working well for you.  Follow up in 2 months or sooner if needed.  Control of Mold Allergen Mold and fungi can grow on a variety of surfaces provided certain temperature and moisture conditions exist.  Outdoor molds grow on plants, decaying vegetation and soil.  The major outdoor mold, Alternaria and Cladosporium, are found in very high numbers during hot and dry conditions.  Generally, a late Summer - Fall peak is seen for common outdoor fungal spores.  Rain will temporarily lower outdoor mold spore count, but counts rise rapidly when the rainy period ends.  The most important indoor molds are Aspergillus and Penicillium.  Dark, humid and poorly ventilated basements are ideal sites for mold growth.  The next most common sites of mold growth are the bathroom and the kitchen.  Outdoor [employer] Use air conditioning and keep windows closed Avoid exposure to decaying vegetation. Avoid leaf  raking. Avoid grain handling. Consider wearing a face mask if working in moldy areas.  Indoor Mold Control Maintain humidity below 50%. Clean washable surfaces with 5% bleach solution. Remove sources e.g. Contaminated carpets.  Control of Dog or Cat Allergen Avoidance is the best way to manage a dog or cat allergy. If you have a dog or cat and are allergic to dog or cats, consider removing the dog or cat from the home. If you have a dog or cat but don't want to find it a new home, or if your family wants a pet even though someone in the household is allergic, here are some strategies that may help keep symptoms at bay:  Keep the pet out of your bedroom and restrict it to only a few rooms. Be advised that keeping the dog or cat in only one room will not limit the allergens to that room. Don't pet, hug or kiss the dog or cat; if you do, wash your hands with soap and water. High-efficiency particulate air (HEPA) cleaners run continuously in a bedroom or living room can reduce allergen levels over time. Regular use of a high-efficiency vacuum cleaner or a central vacuum can reduce allergen levels. Giving your dog or cat a bath at least once a week can reduce airborne allergen.

## 2023-02-02 NOTE — Progress Notes (Signed)
[address] [place] [address] Dept: [phone]  FOLLOW UP NOTE  Patient ID: Lauren [name], female    DOB: October 31, 1950  Age: 72 y.o. MRN: [id number] Date of Office Visit: 02/03/2023  Assessment  Chief Complaint: Allergy Testing (Environmental)  HPI Lauren [name] is a 72 year old female who presents to the clinic for follow-up visit.  She was last seen in this clinic on 01/28/2023 as a new patient by Dr. Dellis Anes for evaluation of chronic rhinitis asthma/COPD syndrome.  Unfortunately, her insurance does not allow testing on the initial visit and she returns today for follow-up with allergy testing.  She reports that she feels well overall and has not had any antihistamines over the last 3 days.   Drug Allergies:  Allergies  Allergen Reactions   Elavil [Amitriptyline] Other (See Comments)    Vivid dreams and almost suicidal   Flagyl [Metronidazole] Itching   Vistaril [Hydroxyzine Hcl] Other (See Comments)    Got higher than a kite on too much of this.   Geodon [Ziprasidone Hydrochloride] Other (See Comments)    Blacked out   Lactose Intolerance (Gi) Other (See Comments)    GI upset   Latex Other (See Comments)    Red at site   Other Other (See Comments)    Physical Exam: BP 112/60 (BP Location: Left Arm, Patient Position: Sitting, Cuff Size: Normal)   Pulse 87   Temp 98.2 F (36.8 C) (Temporal)   Resp 18   SpO2 97%    Physical Exam Vitals reviewed.  Constitutional:      Appearance: Normal appearance.  HENT:     Head: Normocephalic and atraumatic.     Right Ear: Tympanic membrane normal.     Left Ear: Tympanic membrane normal.     Nose:     Comments: Bilateral nares slightly erythematous with clear nasal drainage noted.  Pharynx normal.  Ears normal.  Eyes normal.    Mouth/Throat:     Pharynx: Oropharynx is clear.  Eyes:     Conjunctiva/sclera: Conjunctivae normal.  Cardiovascular:     Rate and Rhythm: Normal rate and regular rhythm.      Heart sounds: Normal heart sounds. No murmur heard. Pulmonary:     Effort: Pulmonary effort is normal.     Breath sounds: Normal breath sounds.     Comments: Lungs clear to auscultation Musculoskeletal:        General: Normal range of motion.     Cervical back: Normal range of motion and neck supple.  Skin:    General: Skin is warm and dry.  Neurological:     Mental Status: She is alert and oriented to person, place, and time.  Psychiatric:        Mood and Affect: Mood normal.        Behavior: Behavior normal.        Thought Content: Thought content normal.        Judgment: Judgment normal.     Diagnostics: FVC 2.42 which is 82% predicted value.  FEV1 2.04 which is 90% of predicted value.  Spirometry indicates normal ventilatory function.  Assessment and Plan: 1. Seasonal and perennial allergic rhinitis   2. Asthma-COPD overlap syndrome   3. Gastroesophageal reflux disease, unspecified whether esophagitis present       Patient Instructions  Allergic rhinitis Your skin testing was positive to indoor molds, outdoor molds, dog, and cat Allergen avoidance measures are listed below Continue an antihistamine once a day as needed for  a runny nose or itch Continue Flonase 2 sprays in each nostril once a day as needed for a stuffy nose.  In the right nostril, point the applicator out toward the right ear. In the left nostril, point the applicator out toward the left ear Consider saline nasal rinses as needed for nasal symptoms. Use this before any medicated nasal sprays for best result  Asthma/COPD overlap Your spirometry was normal today Continue montelukast 10 mg once a day to prevent cough or wheeze Continue Trelegy 200-1 puff once a day to prevent cough or wheeze Continue albuterol 2 puffs once every 4 hours as needed for cough or wheeze You may use albuterol 2 puffs 15 minutes before activity to decrease cough or wheeze  Reflux Continue dietary and lifestyle modifications  as listed below  Call the clinic if this treatment plan is not working well for you.  Follow up in 2 months or sooner if needed.   Return in about 2 months (around 04/05/2023), or if symptoms worsen or fail to improve.    Thank you for the opportunity to care for this patient.  Please do not hesitate to contact me with questions.  Thermon Leyland, FNP [facility]

## 2023-02-03 ENCOUNTER — Ambulatory Visit (INDEPENDENT_AMBULATORY_CARE_PROVIDER_SITE_OTHER): Payer: Medicare Other | Admitting: Family Medicine

## 2023-02-03 ENCOUNTER — Encounter: Payer: Self-pay | Admitting: Family Medicine

## 2023-02-03 VITALS — BP 112/60 | HR 87 | Temp 98.2°F | Resp 18

## 2023-02-03 DIAGNOSIS — J3089 Other allergic rhinitis: Secondary | ICD-10-CM | POA: Diagnosis not present

## 2023-02-03 DIAGNOSIS — J302 Other seasonal allergic rhinitis: Secondary | ICD-10-CM | POA: Diagnosis not present

## 2023-02-03 DIAGNOSIS — J4489 Other specified chronic obstructive pulmonary disease: Secondary | ICD-10-CM

## 2023-02-03 DIAGNOSIS — K219 Gastro-esophageal reflux disease without esophagitis: Secondary | ICD-10-CM

## 2023-02-05 ENCOUNTER — Encounter: Payer: Self-pay | Admitting: Family Medicine

## 2023-02-06 NOTE — Addendum Note (Signed)
Addended by: Elsworth Soho on: 02/06/2023 11:31 AM   Modules accepted: Orders

## 2023-02-08 ENCOUNTER — Ambulatory Visit (HOSPITAL_COMMUNITY): Payer: Medicare Other | Admitting: Psychiatry

## 2023-02-08 ENCOUNTER — Encounter (HOSPITAL_COMMUNITY): Payer: Self-pay

## 2023-02-09 ENCOUNTER — Ambulatory Visit (INDEPENDENT_AMBULATORY_CARE_PROVIDER_SITE_OTHER): Payer: Medicare Other | Admitting: Psychiatry

## 2023-02-09 ENCOUNTER — Encounter (HOSPITAL_COMMUNITY): Payer: Self-pay | Admitting: Psychiatry

## 2023-02-09 VITALS — BP 117/72 | HR 104 | Ht 66.0 in | Wt 152.4 lb

## 2023-02-09 DIAGNOSIS — F5105 Insomnia due to other mental disorder: Secondary | ICD-10-CM

## 2023-02-09 DIAGNOSIS — F411 Generalized anxiety disorder: Secondary | ICD-10-CM

## 2023-02-09 DIAGNOSIS — F332 Major depressive disorder, recurrent severe without psychotic features: Secondary | ICD-10-CM | POA: Diagnosis not present

## 2023-02-09 MED ORDER — LORAZEPAM 0.5 MG PO TABS: 0.2500 mg | ORAL_TABLET | Freq: Every day | ORAL | 2 refills | Status: DC

## 2023-02-09 MED ORDER — VENLAFAXINE HCL ER 150 MG PO CP24: ORAL_CAPSULE | ORAL | 2 refills | Status: DC

## 2023-02-09 MED ORDER — LAMOTRIGINE 100 MG PO TABS: 100.0000 mg | ORAL_TABLET | Freq: Two times a day (BID) | ORAL | 2 refills | Status: DC

## 2023-02-09 MED ORDER — HYDROXYZINE HCL 10 MG PO TABS: 10.0000 mg | ORAL_TABLET | Freq: Three times a day (TID) | ORAL | 2 refills | Status: DC | PRN

## 2023-02-09 NOTE — Progress Notes (Signed)
BH MD/PA/NP OP Progress Note  02/09/2023 2:14 PM RUDY MCCLENNY  MRN:  [id number]  Chief Complaint:  Chief Complaint  Patient presents with   Anxiety   Depression   Follow-up   HPI: This patient is a 72 year old widowed white female who lives alone in [place]. She has one stepchild. Her step son killed himself 12 years ago.Marland Kitchen He was an alcoholic. The patient is on disability for fibromyalgia   The patient returns for follow-up after 3 months regarding her depression anxiety and insomnia.  She states she is developed a lot of bug bites and that anytime she goes outside she gets but not.  Consequently she is itching and scratching all the time.  She has been using more Benadryl and that is causing drowsiness.  I urged her to talk to the primary care or dermatology.  I did not see a lot of bites on her today.  She denies significant depression thoughts of self-harm or suicide.  She denies manic symptoms like racing thoughts or impulsivity.  She is generally sleeping well.  I suggested we add hydroxyzine which might help with the itching as well as the anxiety that is coming along with the bug bites. Visit Diagnosis:    ICD-10-CM   1. Major depressive disorder, recurrent, severe without psychotic features (HCC)  F33.2     2. Generalized anxiety disorder  F41.1     3. Insomnia due to mental disorder  F51.05       Past Psychiatric History: Long history of depression and mood instability.  She was hospitalized about 7 years ago for suicidal ideation  Past Medical History:  Past Medical History:  Diagnosis Date   Anxiety    Chronic pain    COPD (chronic obstructive pulmonary disease) (HCC)    Depression    Diabetes mellitus without complication (HCC)    Fibromyalgia    Fibromyalgia    GERD (gastroesophageal reflux disease)    Headache    Hepatitis C    History of blood transfusion    History of bronchitis    IBS (irritable bowel syndrome)    Osteoarthritis    Pneumonia     Prediabetes    Shortness of breath dyspnea    allergies; increased pain;    Vertigo    Wears glasses     Past Surgical History:  Procedure Laterality Date   BIOPSY  09/16/2020   Procedure: BIOPSY;  Surgeon: Dolores Frame, MD;  [facility];  Service: Gastroenterology;;   BIOPSY  07/11/2022   Procedure: BIOPSY;  Surgeon: Lanelle Bal, DO;  [facility];  Service: Endoscopy;;   CATARACT EXTRACTION W/PHACO Right 08/01/2017   Procedure: CATARACT EXTRACTION PHACO AND INTRAOCULAR LENS PLACEMENT RIGHT EYE;  Surgeon: Jethro Bolus, MD;  Location: [facility];  Service: Ophthalmology;  Laterality: Right;  CDE: 3.30   CATARACT EXTRACTION W/PHACO Left 08/15/2017   Procedure: CATARACT EXTRACTION PHACO AND INTRAOCULAR LENS PLACEMENT (IOC);  Surgeon: Jethro Bolus, MD;  Location: [facility];  Service: Ophthalmology;  Laterality: Left;  CDE: 4.00   CHOLECYSTECTOMY     COLONOSCOPY     COLONOSCOPY N/A 10/23/2014   Procedure: COLONOSCOPY;  Surgeon: Malissa Hippo, MD;  [facility];  Service: Endoscopy;  Laterality: N/A;  1030   COLONOSCOPY WITH PROPOFOL N/A 07/11/2022   Procedure: COLONOSCOPY WITH PROPOFOL;  Surgeon: Lanelle Bal, DO;  [facility];  Service: Endoscopy;  Laterality: N/A;  2:30pm, asa 3  ESOPHAGOGASTRODUODENOSCOPY N/A 10/23/2014   Procedure: ESOPHAGOGASTRODUODENOSCOPY (EGD);  Surgeon: Malissa Hippo, MD;  [facility];  Service: Endoscopy;  Laterality: N/A;   ESOPHAGOGASTRODUODENOSCOPY (EGD) WITH PROPOFOL N/A 09/16/2020   Procedure: ESOPHAGOGASTRODUODENOSCOPY (EGD) WITH PROPOFOL;  Surgeon: Dolores Frame, MD;  [facility];  Service: Gastroenterology;  Laterality: N/A;  7:30   KNEE ARTHROSCOPY Left 10/21/2015   Procedure: ARTHROSCOPY LEFT KNEE WITH MENICAL DEBRIDEMENT, Chondroplasty;  Surgeon: Ollen Gross, MD;  Location: [facility];  Service: Orthopedics;  Laterality: Left;   MANDIBLE SURGERY  09/06/1979    ORIF WRIST FRACTURE Right 07/23/2013   Procedure: OPEN REDUCTION INTERNAL FIXATION (ORIF) WRIST FRACTURE;  Surgeon: Dominica Severin, MD;  Location: [facility];  Service: Orthopedics;  Laterality: Right;   SAVORY DILATION  09/16/2020   Procedure: SAVORY DILATION;  Surgeon: Marguerita Merles, Reuel Boom, MD;  [facility];  Service: Gastroenterology;;   TONSILLECTOMY     TOTAL KNEE ARTHROPLASTY     2011 rt knee   TOTAL KNEE ARTHROPLASTY Left 04/16/2018   Procedure: LEFT TOTAL KNEE ARTHROPLASTY;  Surgeon: Ollen Gross, MD;  [facility];  Service: Orthopedics;  Laterality: Left;   UPPER GASTROINTESTINAL ENDOSCOPY      Family Psychiatric History: See below  Family History:  Family History  Problem Relation Age of Onset   Alcohol abuse Father    Diabetes Father    Stroke Father    Depression Father    Hypertension Father    Heart attack Father    High Cholesterol Father    Alcohol abuse Maternal Grandfather    Alcohol abuse Maternal Grandmother    Hypertension Maternal Grandmother    Alcohol abuse Paternal Grandfather    Alcohol abuse Paternal Grandmother    Diabetes Paternal Grandmother    Stroke Mother    Irritable bowel syndrome Mother    Sexual abuse Mother    Hypertension Mother    Breast cancer Mother    Diabetes Brother    Healthy Brother    Diabetes Brother    Heart disease Brother    Sarcoidosis Brother    Heart failure Brother    Anxiety disorder Paternal Uncle    Alcohol abuse Paternal Uncle    Alcohol abuse Cousin    Anxiety disorder Maternal Uncle    ADD / ADHD Neg Hx    Bipolar disorder Neg Hx    Dementia Neg Hx    Drug abuse Neg Hx    Paranoid behavior Neg Hx    Schizophrenia Neg Hx    Seizures Neg Hx    Physical abuse Neg Hx     Social History:  Social History   Socioeconomic History   Marital status: Widowed    Spouse name: Not on file   Number of children: Not on file   Years of education: Not on file   Highest education level: Not on  file  Occupational History   Not on file  Tobacco Use   Smoking status: Former    Packs/day: 2.00    Years: 15.00    Additional pack years: 0.00    Total pack years: 30.00    Types: Cigarettes    Quit date: 09/05/1982    Years since quitting: 40.4    Passive exposure: Never   Smokeless tobacco: Never  Vaping Use   Vaping Use: Never used  Substance and Sexual Activity   Alcohol use: No    Alcohol/week: 0.0 standard drinks of alcohol    Comment: history of  alcoholism; pt states has been sober 28 years   Drug use: No   Sexual activity: Never  Other Topics Concern   Not on file  Social History Narrative   Not on file   Social Determinants of Health   Financial Resource Strain: Not on file  Food Insecurity: Not on file  Transportation Needs: Not on file  Physical Activity: Not on file  Stress: Not on file  Social Connections: Not on file    Allergies:  Allergies  Allergen Reactions   Elavil [Amitriptyline] Other (See Comments)    Vivid dreams and almost suicidal   Flagyl [Metronidazole] Itching   Vistaril [Hydroxyzine Hcl] Other (See Comments)    Got higher than a kite on too much of this.   Geodon [Ziprasidone Hydrochloride] Other (See Comments)    Blacked out   Lactose Intolerance (Gi) Other (See Comments)    GI upset   Latex Other (See Comments)    Red at site   Other Other (See Comments)    Metabolic Disorder Labs: Lab Results  Component Value Date   HGBA1C 6.1 (H) 10/13/2015   MPG 128 10/13/2015   No results found for: "PROLACTIN" No results found for: "CHOL", "TRIG", "HDL", "CHOLHDL", "VLDL", "LDLCALC" Lab Results  Component Value Date   TSH 2.400 03/19/2014    Therapeutic Level Labs: No results found for: "LITHIUM" No results found for: "VALPROATE" No results found for: "CBMZ"  Current Medications: Current Outpatient Medications  Medication Sig Dispense Refill   acetaminophen (TYLENOL) 500 MG tablet Take 500 mg by mouth every 6 (six) hours as  needed for moderate pain.     albuterol (VENTOLIN HFA) 108 (90 Base) MCG/ACT inhaler INHALE TWO PUFFS BY MOUTH INTO LUNGS EVERY 4 HOURS AS NEEDED FOR SHORTNESS OF BREATH OR wheezing 8.5 g 3   CALCIUM MAGNESIUM ZINC PO Take 1 tablet by mouth daily.     Cholecalciferol (VITAMIN D-3) 25 MCG (1000 UT) CAPS Take 1,000 Units by mouth daily.     clobetasol (TEMOVATE) 0.05 % external solution Apply 1 Application topically 2 (two) times daily as needed.     [employer] Omega-3 Krill Oil 350 MG CAPS Take 350 mg by mouth daily.     Cyanocobalamin (B-12 PO) Place 1 drop under the tongue daily.     dexlansoprazole (DEXILANT) 60 MG capsule Take 1 capsule (60 mg total) by mouth daily. 30 capsule 11   diclofenac sodium (VOLTAREN) 1 % GEL Apply 2 g topically 4 (four) times daily as needed (pain).     dicyclomine (BENTYL) 10 MG capsule Take 1 capsule (10 mg total) by mouth 4 (four) times daily -  before meals and at bedtime. 360 capsule 3   diphenoxylate-atropine (LOMOTIL) 2.5-0.025 MG tablet TAKE ONE TABLET BY MOUTH THREE TIMES DAILY AS NEEDED 60 tablet 1   fluticasone (FLONASE) 50 MCG/ACT nasal spray Place 2 sprays into both nostrils in the morning and at bedtime.     folic acid (FOLVITE) 1 MG tablet Take 1 mg by mouth daily.     gabapentin (NEURONTIN) 100 MG capsule Take 200-300 mg by mouth See admin instructions. Take 200mg  by mouth every day in the morning and take 300mg  by mouth at bedtime     Glucosamine-Chondroitin (GLUCOSAMINE CHONDR COMPLEX PO) Take 6 tablets by mouth daily. Glucosamine 1500mg  and Chondroitin sulfate 1200mg      hydrOXYzine (ATARAX) 10 MG tablet Take 1 tablet (10 mg total) by mouth 3 (three) times daily as needed. 90 tablet 2  losartan (COZAAR) 50 MG tablet Take by mouth daily. Taking 1.5 Tablets Daily     METHOTREXATE PO Take 6 tablets by mouth once a week.     metroNIDAZOLE (METROGEL) 1 % gel Apply 1 application  topically at bedtime.     montelukast (SINGULAIR) 10 MG tablet TAKE ONE TABLET BY  MOUTH EVERYDAY AT BEDTIME 30 tablet 5   Multiple Vitamins-Minerals (HAIR SKIN AND NAILS FORMULA) TABS Take 2 tablets by mouth daily.     ONETOUCH VERIO test strip 1 each by Other route as needed.     rosuvastatin (CRESTOR) 20 MG tablet Take 20 mg by mouth daily.     TRELEGY ELLIPTA 200-62.5-25 MCG/ACT AEPB INHALE 1 PUFF BY MOUTH INTO LUNGS ONCE DAILY 60 each 11   Turmeric (QC TUMERIC COMPLEX) 500 MG CAPS Take 500 mg by mouth daily.     vitamin E 400 UNIT capsule Take 400 Units daily by mouth.     lamoTRIgine (LAMICTAL) 100 MG tablet Take 1 tablet (100 mg total) by mouth 2 (two) times daily. 180 tablet 2   LORazepam (ATIVAN) 0.5 MG tablet Take 0.5 tablets (0.25 mg total) by mouth daily. 15 tablet 2   venlafaxine XR (EFFEXOR-XR) 150 MG 24 hr capsule TAKE TWO CAPSULES BY MOUTH ONCE DAILY 60 capsule 2   No current facility-administered medications for this visit.     Musculoskeletal: Strength & Muscle Tone: within normal limits Gait & Station: normal Patient leans: N/A  Psychiatric Specialty Exam: Review of Systems  Psychiatric/Behavioral:  The patient is nervous/anxious.   All other systems reviewed and are negative.   Blood pressure 117/72, pulse (!) 104, height 5\' 6"  (1.676 m), weight 152 lb 6.4 oz (69.1 kg), SpO2 95 %.Body mass index is 24.6 kg/m.  General Appearance: Casual, Neat, and Well Groomed  Eye Contact:  Good  Speech:  Clear and Coherent  Volume:  Increased  Mood:  Anxious and Euthymic  Affect:  Full Range  Thought Process:  Goal Directed  Orientation:  Full (Time, Place, and Person)  Thought Content: Rumination   Suicidal Thoughts:  No  Homicidal Thoughts:  No  Memory:  Immediate;   Good Recent;   Good Remote;   Good  Judgement:  Good  Insight:  Fair  Psychomotor Activity:  Restlessness  Concentration:  Concentration: Good and Attention Span: Good  Recall:  Good  Fund of Knowledge: Good  Language: Good  Akathisia:  No  Handed:  Right  AIMS (if indicated):  not done  Assets:  Communication Skills Desire for Improvement Resilience Social Support  ADL's:  Intact  Cognition: WNL  Sleep:  Good   Screenings: AUDIT    Flowsheet Row [facility]  Alcohol Use Disorder Identification Test Final Score (AUDIT) 0      GAD-7    Flowsheet Row Office Visit from 02/09/2023 in [facility] [place] Counselor from 10/21/2020 in [facility] [place] Counselor from 06/20/2018 in [facility] at [place]  Total GAD-7 Score 16 10 16       PHQ2-9    Flowsheet Row Office Visit from 02/09/2023 in [facility] [place] Counselor from 12/22/2022 in [facility] at [place] Counselor from 09/06/2022 in [facility] at [place] Counselor from 05/19/2022 in [facility] at [place] Video Visit from 04/13/2022 in [facility]  at [facility] Total Score 4 2 1 2 3   PHQ-9 Total Score 19 11 -- 7 9      Flowsheet Row Admission (Discharged) from 07/11/2022 in [facility] Pre-Admission Testing 60 from 07/06/2022 in [facility] MEDICAL/SURGICAL DAY Video Visit from 12/23/2021 in [facility] at [facility] RISK CATEGORY No Risk No Risk No Risk        Assessment and Plan: This patient is a 72 year old female with a history of depression anxiety and possible bipolar disorder.  For the most part she is doing okay although she complains of a lot of itching and irritability.  We will add hydroxyzine 10 mg up to 3 times daily for the symptoms.  She will continue Lamictal 100 mg twice daily for mood stabilization, Effexor XR 300 mg daily for depression lorazepam 0.25 mg daily as needed for anxiety or sleep.  She will return to  see me in 3 months  Collaboration of Care: Collaboration of Care: Referral or follow-up with counselor/therapist AEB patient has been following up with Florencia Reasons therapist in our office  Patient/Guardian was advised Release of Information must be obtained prior to any record release in order to collaborate their care with an outside provider. Patient/Guardian was advised if they have not already done so to contact the registration department to sign all necessary forms in order for [place] to release information regarding their care.   Consent: Patient/Guardian gives verbal consent for treatment and assignment of benefits for services provided during this visit. Patient/Guardian expressed understanding and agreed to proceed.    Diannia Ruder, MD 02/09/2023, 2:14 PM

## 2023-02-14 ENCOUNTER — Telehealth: Payer: Self-pay

## 2023-02-14 ENCOUNTER — Other Ambulatory Visit: Payer: Self-pay

## 2023-02-14 NOTE — Telephone Encounter (Signed)
Note sent to Dr Marletta Lor

## 2023-02-14 NOTE — Telephone Encounter (Signed)
Pt requests refill of her Dexlansoprazole 60mg  sent to [facility] on [address]

## 2023-02-15 ENCOUNTER — Other Ambulatory Visit: Payer: Self-pay | Admitting: Internal Medicine

## 2023-02-15 MED ORDER — DEXLANSOPRAZOLE 60 MG PO CPDR: 60.0000 mg | DELAYED_RELEASE_CAPSULE | Freq: Every day | ORAL | 11 refills | Status: DC

## 2023-02-15 NOTE — Telephone Encounter (Signed)
Sent to pharmacy, thank you.

## 2023-02-16 NOTE — Telephone Encounter (Signed)
Pt advised through her MyChart

## 2023-02-17 NOTE — Progress Notes (Deleted)
CARDIOLOGY CONSULT NOTE       Patient ID: Lauren [name] MRN: [id number] DOB/AGE: 73-Jan-1952 73 y.o.  Primary Physician: Aliene Beams, MD Primary Cardiologist: Eden Emms   HPI:  72 y.o. last seen by me in 03-27-2021 History of dyspnea Quit smoking in 29' Prior TTE normal EF and normal CXR. On disability from fibromyalgia with chronic fatigue and sees behavioral health for depression. Sober over 26 years Husband and dog died in 03-27-20 Cyclic cough and LE varicosities Seen by PA 06/07/22 and cardiac CTA ordered TTE 03-27-2020 EF 60-65% mild LVH grade one diastolic   Calcium score 581 92 nd percentile CAD RADS 2 non obstructive dx in LAD 25-49% no significant lung findings on over read   Has been on crestor in past for HLD  On ARB for HTN Uses Dexilant for GERD  *** ROS All other systems reviewed and negative except as noted above  Past Medical History:  Diagnosis Date   Anxiety    Chronic pain    COPD (chronic obstructive pulmonary disease) (HCC)    Depression    Diabetes mellitus without complication (HCC)    Fibromyalgia    Fibromyalgia    GERD (gastroesophageal reflux disease)    Headache    Hepatitis C    History of blood transfusion    History of bronchitis    IBS (irritable bowel syndrome)    Osteoarthritis    Pneumonia    Prediabetes    Shortness of breath dyspnea    allergies; increased pain;    Vertigo    Wears glasses     Family History  Problem Relation Age of Onset   Alcohol abuse Father    Diabetes Father    Stroke Father    Depression Father    Hypertension Father    Heart attack Father    High Cholesterol Father    Alcohol abuse Maternal Grandfather    Alcohol abuse Maternal Grandmother    Hypertension Maternal Grandmother    Alcohol abuse Paternal Grandfather    Alcohol abuse Paternal Grandmother    Diabetes Paternal Grandmother    Stroke Mother    Irritable bowel syndrome Mother    Sexual abuse Mother    Hypertension Mother    Breast cancer Mother     Diabetes Brother    Healthy Brother    Diabetes Brother    Heart disease Brother    Sarcoidosis Brother    Heart failure Brother    Anxiety disorder Paternal Uncle    Alcohol abuse Paternal Uncle    Alcohol abuse Cousin    Anxiety disorder Maternal Uncle    ADD / ADHD Neg Hx    Bipolar disorder Neg Hx    Dementia Neg Hx    Drug abuse Neg Hx    Paranoid behavior Neg Hx    Schizophrenia Neg Hx    Seizures Neg Hx    Physical abuse Neg Hx     Social History   Socioeconomic History   Marital status: Widowed    Spouse name: Not on file   Number of children: Not on file   Years of education: Not on file   Highest education level: Not on file  Occupational History   Not on file  Tobacco Use   Smoking status: Former    Packs/day: 2.00    Years: 15.00    Additional pack years: 0.00    Total pack years: 30.00    Types: Cigarettes    Quit date: 09/05/1982  Years since quitting: 40.4    Passive exposure: Never   Smokeless tobacco: Never  Vaping Use   Vaping Use: Never used  Substance and Sexual Activity   Alcohol use: No    Alcohol/week: 0.0 standard drinks of alcohol    Comment: history of alcoholism; pt states has been sober 28 years   Drug use: No   Sexual activity: Never  Other Topics Concern   Not on file  Social History Narrative   Not on file   Social Determinants of Health   Financial Resource Strain: Not on file  Food Insecurity: Not on file  Transportation Needs: Not on file  Physical Activity: Not on file  Stress: Not on file  Social Connections: Not on file  Intimate Partner Violence: Not on file    Past Surgical History:  Procedure Laterality Date   BIOPSY  09/16/2020   Procedure: BIOPSY;  Surgeon: Dolores Frame, MD;  [facility];  Service: Gastroenterology;;   BIOPSY  07/11/2022   Procedure: BIOPSY;  Surgeon: Lanelle Bal, DO;  [facility]: [facility];  Service: Endoscopy;;   CATARACT EXTRACTION W/PHACO Right  08/01/2017   Procedure: CATARACT EXTRACTION PHACO AND INTRAOCULAR LENS PLACEMENT RIGHT EYE;  Surgeon: Jethro Bolus, MD;  Location: [facility];  Service: Ophthalmology;  Laterality: Right;  CDE: 3.30   CATARACT EXTRACTION W/PHACO Left 08/15/2017   Procedure: CATARACT EXTRACTION PHACO AND INTRAOCULAR LENS PLACEMENT (IOC);  Surgeon: Jethro Bolus, MD;  Location: [facility];  Service: Ophthalmology;  Laterality: Left;  CDE: 4.00   CHOLECYSTECTOMY     COLONOSCOPY     COLONOSCOPY N/A 10/23/2014   Procedure: COLONOSCOPY;  Surgeon: Malissa Hippo, MD;  [facility];  Service: Endoscopy;  Laterality: N/A;  1030   COLONOSCOPY WITH PROPOFOL N/A 07/11/2022   Procedure: COLONOSCOPY WITH PROPOFOL;  Surgeon: Lanelle Bal, DO;  [facility];  Service: Endoscopy;  Laterality: N/A;  2:30pm, asa 3   ESOPHAGOGASTRODUODENOSCOPY N/A 10/23/2014   Procedure: ESOPHAGOGASTRODUODENOSCOPY (EGD);  Surgeon: Malissa Hippo, MD;  [facility];  Service: Endoscopy;  Laterality: N/A;   ESOPHAGOGASTRODUODENOSCOPY (EGD) WITH PROPOFOL N/A 09/16/2020   Procedure: ESOPHAGOGASTRODUODENOSCOPY (EGD) WITH PROPOFOL;  Surgeon: Dolores Frame, MD;  [facility];  Service: Gastroenterology;  Laterality: N/A;  7:30   KNEE ARTHROSCOPY Left 10/21/2015   Procedure: ARTHROSCOPY LEFT KNEE WITH MENICAL DEBRIDEMENT, Chondroplasty;  Surgeon: Ollen Gross, MD;  Location: [facility];  Service: Orthopedics;  Laterality: Left;   MANDIBLE SURGERY  09/06/1979   ORIF WRIST FRACTURE Right 07/23/2013   Procedure: OPEN REDUCTION INTERNAL FIXATION (ORIF) WRIST FRACTURE;  Surgeon: Dominica Severin, MD;  Location: [facility];  Service: Orthopedics;  Laterality: Right;   SAVORY DILATION  09/16/2020   Procedure: SAVORY DILATION;  Surgeon: Marguerita Merles, Reuel Boom, MD;  [facility];  Service: Gastroenterology;;   TONSILLECTOMY     TOTAL KNEE ARTHROPLASTY     2011 rt knee   TOTAL KNEE ARTHROPLASTY Left 04/16/2018    Procedure: LEFT TOTAL KNEE ARTHROPLASTY;  Surgeon: Ollen Gross, MD;  Location: [facility];  Service: Orthopedics;  Laterality: Left;   UPPER GASTROINTESTINAL ENDOSCOPY        Current Outpatient Medications:    acetaminophen (TYLENOL) 500 MG tablet, Take 500 mg by mouth every 6 (six) hours as needed for moderate pain., Disp: , Rfl:    albuterol (VENTOLIN HFA) 108 (90 Base) MCG/ACT inhaler, INHALE TWO PUFFS BY MOUTH INTO LUNGS EVERY 4 HOURS  AS NEEDED FOR SHORTNESS OF BREATH OR wheezing, Disp: 8.5 g, Rfl: 3   CALCIUM MAGNESIUM ZINC PO, Take 1 tablet by mouth daily., Disp: , Rfl:    Cholecalciferol (VITAMIN D-3) 25 MCG (1000 UT) CAPS, Take 1,000 Units by mouth daily., Disp: , Rfl:    clobetasol (TEMOVATE) 0.05 % external solution, Apply 1 Application topically 2 (two) times daily as needed., Disp: , Rfl:    [employer] Omega-3 Krill Oil 350 MG CAPS, Take 350 mg by mouth daily., Disp: , Rfl:    Cyanocobalamin (B-12 PO), Place 1 drop under the tongue daily., Disp: , Rfl:    dexlansoprazole (DEXILANT) 60 MG capsule, Take 1 capsule (60 mg total) by mouth daily., Disp: 30 capsule, Rfl: 11   diclofenac sodium (VOLTAREN) 1 % GEL, Apply 2 g topically 4 (four) times daily as needed (pain)., Disp: , Rfl:    dicyclomine (BENTYL) 10 MG capsule, Take 1 capsule (10 mg total) by mouth 4 (four) times daily -  before meals and at bedtime., Disp: 360 capsule, Rfl: 3   diphenoxylate-atropine (LOMOTIL) 2.5-0.025 MG tablet, TAKE ONE TABLET BY MOUTH THREE TIMES DAILY AS NEEDED, Disp: 60 tablet, Rfl: 1   fluticasone (FLONASE) 50 MCG/ACT nasal spray, Place 2 sprays into both nostrils in the morning and at bedtime., Disp: , Rfl:    folic acid (FOLVITE) 1 MG tablet, Take 1 mg by mouth daily., Disp: , Rfl:    gabapentin (NEURONTIN) 100 MG capsule, Take 200-300 mg by mouth See admin instructions. Take 200mg  by mouth every day in the morning and take 300mg  by mouth at bedtime, Disp: , Rfl:    Glucosamine-Chondroitin (GLUCOSAMINE CHONDR  COMPLEX PO), Take 6 tablets by mouth daily. Glucosamine 1500mg  and Chondroitin sulfate 1200mg , Disp: , Rfl:    hydrOXYzine (ATARAX) 10 MG tablet, Take 1 tablet (10 mg total) by mouth 3 (three) times daily as needed., Disp: 90 tablet, Rfl: 2   lamoTRIgine (LAMICTAL) 100 MG tablet, Take 1 tablet (100 mg total) by mouth 2 (two) times daily., Disp: 180 tablet, Rfl: 2   LORazepam (ATIVAN) 0.5 MG tablet, Take 0.5 tablets (0.25 mg total) by mouth daily., Disp: 15 tablet, Rfl: 2   losartan (COZAAR) 50 MG tablet, Take by mouth daily. Taking 1.5 Tablets Daily, Disp: , Rfl:    METHOTREXATE PO, Take 6 tablets by mouth once a week., Disp: , Rfl:    metroNIDAZOLE (METROGEL) 1 % gel, Apply 1 application  topically at bedtime., Disp: , Rfl:    montelukast (SINGULAIR) 10 MG tablet, TAKE ONE TABLET BY MOUTH EVERYDAY AT BEDTIME, Disp: 30 tablet, Rfl: 5   Multiple Vitamins-Minerals (HAIR SKIN AND NAILS FORMULA) TABS, Take 2 tablets by mouth daily., Disp: , Rfl:    ONETOUCH VERIO test strip, 1 each by Other route as needed., Disp: , Rfl:    rosuvastatin (CRESTOR) 20 MG tablet, Take 20 mg by mouth daily., Disp: , Rfl:    TRELEGY ELLIPTA 200-62.5-25 MCG/ACT AEPB, INHALE 1 PUFF BY MOUTH INTO LUNGS ONCE DAILY, Disp: 60 each, Rfl: 11   Turmeric (QC TUMERIC COMPLEX) 500 MG CAPS, Take 500 mg by mouth daily., Disp: , Rfl:    venlafaxine XR (EFFEXOR-XR) 150 MG 24 hr capsule, TAKE TWO CAPSULES BY MOUTH ONCE DAILY, Disp: 60 capsule, Rfl: 2   vitamin E 400 UNIT capsule, Take 400 Units daily by mouth., Disp: , Rfl:     Physical Exam: There were no vitals taken for this visit.    Affect appropriate Healthy:  appears stated age HEENT: normal Neck supple with no adenopathy JVP normal no bruits no thyromegaly Lungs clear with no wheezing and good diaphragmatic motion Heart:  S1/S2 no murmur, no rub, gallop or click PMI normal Abdomen: benighn, BS positve, no tenderness, no AAA no bruit.  No HSM or HJR Distal pulses intact  with no bruits No edema Neuro non-focal Skin warm and dry No muscular weakness   Labs:   Lab Results  Component Value Date   WBC 18.6 (H) 09/25/2019   HGB 12.6 09/25/2019   HCT 40.4 09/25/2019   MCV 90.6 09/25/2019   PLT 212 09/25/2019   No results for input(s): "NA", "K", "CL", "CO2", "BUN", "CREATININE", "CALCIUM", "PROT", "BILITOT", "ALKPHOS", "ALT", "AST", "GLUCOSE" in the last 168 hours.  Invalid input(s): "LABALBU" Lab Results  Component Value Date   TROPONINI <0.03 01/26/2017   No results found for: "CHOL" No results found for: "HDL" No results found for: "LDLCALC" No results found for: "TRIG" No results found for: "CHOLHDL" No results found for: "LDLDIRECT"    Radiology: No results found.  EKG: SR rate 91 normal 06/07/22    ASSESSMENT AND PLAN:   CAD: subclinical 25-49% LAD by CTA 07/01/22 High calcium score for age *** Dyspnea;  non cardiac EF normal grade one diastolic  HTN:  continue losartan mild LVH on prior TTE 2021 Fibromyalgia:  on Effexor  Depression see above GERD:  continue Dexilant and low carb diet  HLD:  update labs ***  Lipid/Liver ***  F/U in a year   Signed: Charlton Haws 02/17/2023, 1:39 PM

## 2023-02-23 ENCOUNTER — Ambulatory Visit (INDEPENDENT_AMBULATORY_CARE_PROVIDER_SITE_OTHER): Payer: Medicare Other | Admitting: Psychiatry

## 2023-02-23 DIAGNOSIS — F332 Major depressive disorder, recurrent severe without psychotic features: Secondary | ICD-10-CM
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
## 2023-03-02 ENCOUNTER — Ambulatory Visit: Payer: Medicare Other | Admitting: Cardiovascular Disease

## 2023-03-13 ENCOUNTER — Ambulatory Visit (INDEPENDENT_AMBULATORY_CARE_PROVIDER_SITE_OTHER): Payer: Medicare Other | Admitting: Psychiatry

## 2023-03-13 DIAGNOSIS — F332 Major depressive disorder, recurrent severe without psychotic features: Secondary | ICD-10-CM | POA: Diagnosis not present
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
## 2023-04-04 ENCOUNTER — Other Ambulatory Visit: Payer: Self-pay | Admitting: Gastroenterology

## 2023-04-04 ENCOUNTER — Other Ambulatory Visit: Payer: Self-pay

## 2023-04-04 DIAGNOSIS — R197 Diarrhea, unspecified: Secondary | ICD-10-CM

## 2023-04-04 MED ORDER — DIPHENOXYLATE-ATROPINE 2.5-0.025 MG PO TABS: 1.0000 | ORAL_TABLET | Freq: Three times a day (TID) | ORAL | 1 refills | Status: DC | PRN

## 2023-04-04 NOTE — Telephone Encounter (Signed)
[facility] sent in Rx for the pt

## 2023-04-05 ENCOUNTER — Encounter: Payer: Self-pay | Admitting: Family Medicine

## 2023-04-05 ENCOUNTER — Ambulatory Visit (INDEPENDENT_AMBULATORY_CARE_PROVIDER_SITE_OTHER): Payer: Medicare Other | Admitting: Family Medicine

## 2023-04-05 ENCOUNTER — Other Ambulatory Visit: Payer: Self-pay

## 2023-04-05 VITALS — BP 120/60 | HR 102 | Temp 97.6°F | Resp 20 | Wt 152.5 lb

## 2023-04-05 DIAGNOSIS — J4489 Other specified chronic obstructive pulmonary disease: Secondary | ICD-10-CM | POA: Diagnosis not present

## 2023-04-05 DIAGNOSIS — J3089 Other allergic rhinitis: Secondary | ICD-10-CM

## 2023-04-05 DIAGNOSIS — K219 Gastro-esophageal reflux disease without esophagitis: Secondary | ICD-10-CM | POA: Diagnosis not present

## 2023-04-05 DIAGNOSIS — J302 Other seasonal allergic rhinitis: Secondary | ICD-10-CM

## 2023-04-05 MED ORDER — TRELEGY ELLIPTA 200-62.5-25 MCG/ACT IN AEPB: INHALATION_SPRAY | RESPIRATORY_TRACT | 5 refills | Status: DC

## 2023-04-05 MED ORDER — AZELASTINE HCL 0.1 % NA SOLN: 2.0000 | Freq: Two times a day (BID) | NASAL | 5 refills | Status: DC | PRN

## 2023-04-05 MED ORDER — FLUTICASONE PROPIONATE 50 MCG/ACT NA SUSP: 2.0000 | Freq: Every day | NASAL | 5 refills | Status: AC | PRN

## 2023-04-05 NOTE — Patient Instructions (Addendum)
Allergic rhinitis Continue allergen avoidance measures directed toward indoor molds, outdoor molds, dog, and cat as listed below Stop oral antihistamines at this time as they are making you sleepy Begin azelastine nasal spray 2 sprays in each nostril up to twice a day as needed for a runny nose or sneezing Continue Flonase 2 sprays in each nostril once a day as needed for a stuffy nose.  In the right nostril, point the applicator out toward the right ear. In the left nostril, point the applicator out toward the left ear Consider saline nasal rinses as needed for nasal symptoms. Use this before any medicated nasal sprays for best result  Asthma/COPD overlap Continue montelukast 10 mg once a day to prevent cough or wheeze Continue Trelegy 200-1 puff once a day to prevent cough or wheeze Continue albuterol 2 puffs once every 4 hours as needed for cough or wheeze You may use albuterol 2 puffs 15 minutes before activity to decrease cough or wheeze  Reflux Continue dietary and lifestyle modifications as listed below  Call the clinic if this treatment plan is not working well for you.  Follow up in 4 months or sooner if needed.  Control of Mold Allergen Mold and fungi can grow on a variety of surfaces provided certain temperature and moisture conditions exist.  Outdoor molds grow on plants, decaying vegetation and soil.  The major outdoor mold, Alternaria and Cladosporium, are found in very high numbers during hot and dry conditions.  Generally, a late Summer - Fall peak is seen for common outdoor fungal spores.  Rain will temporarily lower outdoor mold spore count, but counts rise rapidly when the rainy period ends.  The most important indoor molds are Aspergillus and Penicillium.  Dark, humid and poorly ventilated basements are ideal sites for mold growth.  The next most common sites of mold growth are the bathroom and the kitchen.  Outdoor [employer] Use air conditioning and keep windows  closed Avoid exposure to decaying vegetation. Avoid leaf raking. Avoid grain handling. Consider wearing a face mask if working in moldy areas.  Indoor Mold Control Maintain humidity below 50%. Clean washable surfaces with 5% bleach solution. Remove sources e.g. Contaminated carpets.  Control of Dog or Cat Allergen Avoidance is the best way to manage a dog or cat allergy. If you have a dog or cat and are allergic to dog or cats, consider removing the dog or cat from the home. If you have a dog or cat but don't want to find it a new home, or if your family wants a pet even though someone in the household is allergic, here are some strategies that may help keep symptoms at bay:  Keep the pet out of your bedroom and restrict it to only a few rooms. Be advised that keeping the dog or cat in only one room will not limit the allergens to that room. Don't pet, hug or kiss the dog or cat; if you do, wash your hands with soap and water. High-efficiency particulate air (HEPA) cleaners run continuously in a bedroom or living room can reduce allergen levels over time. Regular use of a high-efficiency vacuum cleaner or a central vacuum can reduce allergen levels. Giving your dog or cat a bath at least once a week can reduce airborne allergen.

## 2023-04-05 NOTE — Progress Notes (Signed)
[address] [place] [address] Dept: [phone]  FOLLOW UP NOTE  Patient ID: Lauren [name], female    DOB: 1951-06-18  Age: 72 y.o. MRN: [id number] Date of Office Visit: 04/05/2023  Assessment  Chief Complaint: Follow-up (Antihistamines are making her drowsy )  HPI Lauren [name] is a 72 year old female who presents to the clinic for follow-up visit.  She was last seen in this clinic on 02/03/2023 for her initial environmental allergy skin testing, allergic rhinitis, asthma COPD overlap syndrome, and reflux.    At today's visit, she reports her asthma/COPD has been moderately well-controlled with occasional shortness of breath and cough producing mucus as the main symptoms.  She continues montelukast 10 mg once a day and rarely uses albuterol.  She had been using Trelegy 200, however, has recently been out of this medication.  She reports a significant decrease in her symptoms of COPD asthma overlap syndrome while continuing on Trelegy 200.   Allergic rhinitis is reported as poorly controlled with symptoms including clear rhinorrhea and copious postnasal drainage.  She continues taking a daily antihistamine as well as Benadryl at night.  She reports that this makes her very sleepy and dizzy at times.  She continues Flonase and nasal saline rinses daily.  Her environmental allergy testing on 02/03/2023 was positive to indoor mold, outdoor mold, dog, and cat.  Reflux is reported as well-controlled at this time and she continues Dexilant 60 mg once a day.  Her current medications are listed in the chart.  Drug Allergies:  Allergies  Allergen Reactions   Elavil [Amitriptyline] Other (See Comments)    Vivid dreams and almost suicidal   Flagyl [Metronidazole] Itching   Vistaril [Hydroxyzine Hcl] Other (See Comments)    Got higher than a kite on too much of this.   Geodon [Ziprasidone Hydrochloride] Other (See Comments)    Blacked out   Lactose Intolerance (Gi)  Other (See Comments)    GI upset   Latex Other (See Comments)    Red at site   Other Other (See Comments)    Physical Exam: BP 120/60   Pulse (!) 102   Temp 97.6 F (36.4 C)   Resp 20   Wt 152 lb 8 oz (69.2 kg)   SpO2 95%   BMI 24.61 kg/m    Physical Exam Vitals reviewed.  Constitutional:      Appearance: Normal appearance.  HENT:     Head: Normocephalic and atraumatic.     Right Ear: Tympanic membrane normal.     Left Ear: Tympanic membrane normal.     Nose:     Comments: Bilateral naris slightly erythematous with thin clear nasal drainage noted.  Pharynx normal.  Ears normal.  Eyes normal.    Mouth/Throat:     Pharynx: Oropharynx is clear.  Eyes:     Conjunctiva/sclera: Conjunctivae normal.  Cardiovascular:     Rate and Rhythm: Normal rate and regular rhythm.     Heart sounds: Normal heart sounds. No murmur heard. Pulmonary:     Effort: Pulmonary effort is normal.     Breath sounds: Normal breath sounds.     Comments: Lungs clear to auscultation Musculoskeletal:        General: Normal range of motion.     Cervical back: Normal range of motion and neck supple.  Skin:    General: Skin is warm and dry.  Neurological:     Mental Status: She is alert and oriented to person, place,  and time.  Psychiatric:        Mood and Affect: Mood normal.        Behavior: Behavior normal.        Thought Content: Thought content normal.        Judgment: Judgment normal.     Diagnostics: FVC 2.23 which is 75% of predicted value, FEV1 1.87 which is 82% of predicted value.  Spirometry indicates normal ventilatory control  Assessment and Plan: 1. Asthma-COPD overlap syndrome   2. Seasonal and perennial allergic rhinitis   3. Gastroesophageal reflux disease, unspecified whether esophagitis present     Meds ordered this encounter  Medications   DISCONTD: Fluticasone-Umeclidin-Vilant (TRELEGY ELLIPTA) 200-62.5-25 MCG/ACT AEPB    Sig: Take 1 puff once a day to control asthma  symptoms    Dispense:  1 each    Refill:  5    Order Specific Question:   Lot Number?    Answer:   [id number]    Order Specific Question:   Expiration Date?    Answer:   06/04/2021    Order Specific Question:   Manufacturer?    Answer:   [employer] [[id number]]    Order Specific Question:   Quantity    Answer:   1   Fluticasone-Umeclidin-Vilant (TRELEGY ELLIPTA) 200-62.5-25 MCG/ACT AEPB    Sig: Take 1 puff once a day to control asthma symptoms    Dispense:  1 each    Refill:  5    Order Specific Question:   Lot Number?    Answer:   [id number]    Order Specific Question:   Expiration Date?    Answer:   06/04/2021    Order Specific Question:   Manufacturer?    Answer:   [employer] [[id number]]    Order Specific Question:   Quantity    Answer:   1   azelastine (ASTELIN) 0.1 % nasal spray    Sig: Place 2 sprays into both nostrils 2 (two) times daily as needed (runny nose or sneezing).    Dispense:  30 mL    Refill:  5   fluticasone (FLONASE) 50 MCG/ACT nasal spray    Sig: Place 2 sprays into both nostrils daily as needed (for a stuffy nose).    Dispense:  16 g    Refill:  5   Azelastine HCl 0.15 % SOLN    Sig: Place 2 sprays into both nostrils daily.    Dispense:  30 mL    Refill:  5    Patient Instructions  Allergic rhinitis Continue allergen avoidance measures directed toward indoor molds, outdoor molds, dog, and cat as listed below Stop oral antihistamines at this time as they are making you sleepy Begin azelastine nasal spray 2 sprays in each nostril up to twice a day as needed for a runny nose or sneezing Continue Flonase 2 sprays in each nostril once a day as needed for a stuffy nose.  In the right nostril, point the applicator out toward the right ear. In the left nostril, point the applicator out toward the left ear Consider saline nasal rinses as needed for nasal symptoms. Use this before any medicated nasal sprays for best result  Asthma/COPD overlap Continue montelukast 10 mg once a  day to prevent cough or wheeze Continue Trelegy 200-1 puff once a day to prevent cough or wheeze Continue albuterol 2 puffs once every 4 hours as needed for cough or wheeze You may use albuterol 2 puffs 15 minutes before activity to decrease cough  or wheeze  Reflux Continue dietary and lifestyle modifications as listed below  Call the clinic if this treatment plan is not working well for you.  Follow up in 4 months or sooner if needed.   Return in about 4 months (around 08/05/2023), or if symptoms worsen or fail to improve.    Thank you for the opportunity to care for this patient.  Please do not hesitate to contact me with questions.  Thermon Leyland, FNP [facility]

## 2023-04-06 ENCOUNTER — Encounter: Payer: Self-pay | Admitting: Family Medicine

## 2023-04-06 ENCOUNTER — Other Ambulatory Visit: Payer: Self-pay | Admitting: Family Medicine

## 2023-04-06 MED ORDER — AZELASTINE HCL 0.15 % NA SOLN: 2.0000 | Freq: Every day | NASAL | 5 refills | Status: DC

## 2023-04-12 NOTE — Telephone Encounter (Signed)
Azelastine is not covered by the patient's insurance. Please advise on change.

## 2023-04-17 NOTE — Telephone Encounter (Signed)
Astepro over the counter then please. Thank you

## 2023-04-18 ENCOUNTER — Ambulatory Visit (INDEPENDENT_AMBULATORY_CARE_PROVIDER_SITE_OTHER): Payer: Medicare Other | Admitting: Psychiatry

## 2023-04-18 DIAGNOSIS — F332 Major depressive disorder, recurrent severe without psychotic features: Secondary | ICD-10-CM
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
## 2023-04-18 NOTE — Telephone Encounter (Signed)
I called patient and left a message to call the office back to inform.

## 2023-04-25 NOTE — Telephone Encounter (Signed)
I called patient and left a detailed message stating that the azelastine is not covered by ins. I informed via vm that she can buy astepro otc.

## 2023-04-27 DIAGNOSIS — L218 Other seborrheic dermatitis: Secondary | ICD-10-CM | POA: Diagnosis not present

## 2023-04-27 DIAGNOSIS — D225 Melanocytic nevi of trunk: Secondary | ICD-10-CM | POA: Diagnosis not present

## 2023-04-27 DIAGNOSIS — Z1283 Encounter for screening for malignant neoplasm of skin: Secondary | ICD-10-CM | POA: Diagnosis not present

## 2023-05-01 DIAGNOSIS — N649 Disorder of breast, unspecified: Secondary | ICD-10-CM | POA: Diagnosis not present

## 2023-05-01 DIAGNOSIS — E7800 Pure hypercholesterolemia, unspecified: Secondary | ICD-10-CM | POA: Diagnosis not present

## 2023-05-01 DIAGNOSIS — D84821 Immunodeficiency due to drugs: Secondary | ICD-10-CM | POA: Diagnosis not present

## 2023-05-01 DIAGNOSIS — J3089 Other allergic rhinitis: Secondary | ICD-10-CM | POA: Diagnosis not present

## 2023-05-01 DIAGNOSIS — Z0000 Encounter for general adult medical examination without abnormal findings: Secondary | ICD-10-CM | POA: Diagnosis not present

## 2023-05-01 DIAGNOSIS — Z1231 Encounter for screening mammogram for malignant neoplasm of breast: Secondary | ICD-10-CM | POA: Diagnosis not present

## 2023-05-01 DIAGNOSIS — Z9181 History of falling: Secondary | ICD-10-CM | POA: Diagnosis not present

## 2023-05-01 DIAGNOSIS — D649 Anemia, unspecified: Secondary | ICD-10-CM | POA: Diagnosis not present

## 2023-05-01 DIAGNOSIS — E1121 Type 2 diabetes mellitus with diabetic nephropathy: Secondary | ICD-10-CM | POA: Diagnosis not present

## 2023-05-01 DIAGNOSIS — J4550 Severe persistent asthma, uncomplicated: Secondary | ICD-10-CM | POA: Diagnosis not present

## 2023-05-01 DIAGNOSIS — I10 Essential (primary) hypertension: Secondary | ICD-10-CM | POA: Diagnosis not present

## 2023-05-02 ENCOUNTER — Ambulatory Visit (INDEPENDENT_AMBULATORY_CARE_PROVIDER_SITE_OTHER): Payer: Medicare Other | Admitting: Psychiatry

## 2023-05-02 DIAGNOSIS — F332 Major depressive disorder, recurrent severe without psychotic features: Secondary | ICD-10-CM | POA: Diagnosis not present

## 2023-05-02 DIAGNOSIS — F411 Generalized anxiety disorder: Secondary | ICD-10-CM
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
## 2023-05-03 ENCOUNTER — Other Ambulatory Visit (HOSPITAL_COMMUNITY): Payer: Self-pay | Admitting: Family Medicine

## 2023-05-03 DIAGNOSIS — N63 Unspecified lump in unspecified breast: Secondary | ICD-10-CM

## 2023-05-09 ENCOUNTER — Encounter (HOSPITAL_COMMUNITY): Payer: Self-pay | Admitting: Psychiatry

## 2023-05-09 ENCOUNTER — Ambulatory Visit (INDEPENDENT_AMBULATORY_CARE_PROVIDER_SITE_OTHER): Payer: Medicare Other | Admitting: Psychiatry

## 2023-05-09 VITALS — BP 145/82 | HR 89 | Ht 66.0 in | Wt 151.8 lb

## 2023-05-09 DIAGNOSIS — F411 Generalized anxiety disorder: Secondary | ICD-10-CM

## 2023-05-09 DIAGNOSIS — F332 Major depressive disorder, recurrent severe without psychotic features: Secondary | ICD-10-CM | POA: Diagnosis not present

## 2023-05-09 DIAGNOSIS — F5105 Insomnia due to other mental disorder: Secondary | ICD-10-CM

## 2023-05-09 MED ORDER — LORAZEPAM 0.5 MG PO TABS: 0.2500 mg | ORAL_TABLET | Freq: Every day | ORAL | 2 refills | Status: DC | PRN

## 2023-05-09 MED ORDER — HYDROXYZINE HCL 10 MG PO TABS: 10.0000 mg | ORAL_TABLET | Freq: Three times a day (TID) | ORAL | 2 refills | Status: DC | PRN

## 2023-05-09 MED ORDER — VENLAFAXINE HCL ER 150 MG PO CP24: ORAL_CAPSULE | ORAL | 2 refills | Status: DC

## 2023-05-09 MED ORDER — LAMOTRIGINE 100 MG PO TABS: 100.0000 mg | ORAL_TABLET | Freq: Two times a day (BID) | ORAL | 2 refills | Status: DC

## 2023-05-09 NOTE — Progress Notes (Signed)
BH MD/PA/NP OP Progress Note  05/09/2023 3:06 PM Lauren [name]  MRN:  [id number]  Chief Complaint:  Chief Complaint  Patient presents with   Depression   Anxiety   Follow-up   HPI: This patient is a 72 year old widowed white female who lives alone in [place].  She has 1 stepchild.  Another son killed himself several years ago.  The patient is on disability for fibromyalgia.  The patient returns for follow-up after 3 months regarding her depression anxiety and insomnia.  She states that her brother-in-law died recently and she felt guilty because she had spent much time with him or his family since her husband died 3 years ago.  She did go to the funeral.  She has been more sad and sleeping more lately but she does that she will get out of it eventually.  She is trying to spend time with friends and go to the [facility] to exercise.  She denies significant depression or anxiety other than the bereavement.  She denies any thoughts of self-harm or suicide.  She tries not to use much of the hydroxyzine or lorazepam because it makes her very drowsy but they do help when she is having anxiety. Visit Diagnosis:    ICD-10-CM   1. Major depressive disorder, recurrent, severe without psychotic features (HCC)  F33.2     2. Generalized anxiety disorder  F41.1     3. Insomnia due to mental disorder  F51.05       Past Psychiatric History: Long history of depression and mood instability.  She was hospitalized about 7 years ago for suicidal ideation.  Past Medical History:  Past Medical History:  Diagnosis Date   Anxiety    Chronic pain    COPD (chronic obstructive pulmonary disease) (HCC)    Depression    Diabetes mellitus without complication (HCC)    Fibromyalgia    Fibromyalgia    GERD (gastroesophageal reflux disease)    Headache    Hepatitis C    History of blood transfusion    History of bronchitis    IBS (irritable bowel syndrome)    Osteoarthritis    Pneumonia    Prediabetes     Shortness of breath dyspnea    allergies; increased pain;    Vertigo    Wears glasses     Past Surgical History:  Procedure Laterality Date   BIOPSY  09/16/2020   Procedure: BIOPSY;  Surgeon: Dolores Frame, MD;  [facility];  Service: Gastroenterology;;   BIOPSY  07/11/2022   Procedure: BIOPSY;  Surgeon: Lanelle Bal, DO;  [facility];  Service: Endoscopy;;   CATARACT EXTRACTION W/PHACO Right 08/01/2017   Procedure: CATARACT EXTRACTION PHACO AND INTRAOCULAR LENS PLACEMENT RIGHT EYE;  Surgeon: Jethro Bolus, MD;  [facility]: [facility];  Service: Ophthalmology;  Laterality: Right;  CDE: 3.30   CATARACT EXTRACTION W/PHACO Left 08/15/2017   Procedure: CATARACT EXTRACTION PHACO AND INTRAOCULAR LENS PLACEMENT (IOC);  Surgeon: Jethro Bolus, MD;  Location: [facility];  Service: Ophthalmology;  Laterality: Left;  CDE: 4.00   CHOLECYSTECTOMY     COLONOSCOPY     COLONOSCOPY N/A 10/23/2014   Procedure: COLONOSCOPY;  Surgeon: Malissa Hippo, MD;  [facility];  Service: Endoscopy;  Laterality: N/A;  1030   COLONOSCOPY WITH PROPOFOL N/A 07/11/2022   Procedure: COLONOSCOPY WITH PROPOFOL;  Surgeon: Lanelle Bal, DO;  [facility];  Service: Endoscopy;  Laterality: N/A;  2:30pm, asa 3  ESOPHAGOGASTRODUODENOSCOPY N/A 10/23/2014   Procedure: ESOPHAGOGASTRODUODENOSCOPY (EGD);  Surgeon: Malissa Hippo, MD;  [facility];  Service: Endoscopy;  Laterality: N/A;   ESOPHAGOGASTRODUODENOSCOPY (EGD) WITH PROPOFOL N/A 09/16/2020   Procedure: ESOPHAGOGASTRODUODENOSCOPY (EGD) WITH PROPOFOL;  Surgeon: Dolores Frame, MD;  [facility];  Service: Gastroenterology;  Laterality: N/A;  7:30   KNEE ARTHROSCOPY Left 10/21/2015   Procedure: ARTHROSCOPY LEFT KNEE WITH MENICAL DEBRIDEMENT, Chondroplasty;  Surgeon: Ollen Gross, MD;  Location: [facility];  Service: Orthopedics;  Laterality: Left;   MANDIBLE SURGERY  09/06/1979   ORIF WRIST  FRACTURE Right 07/23/2013   Procedure: OPEN REDUCTION INTERNAL FIXATION (ORIF) WRIST FRACTURE;  Surgeon: Dominica Severin, MD;  Location: [facility];  Service: Orthopedics;  Laterality: Right;   SAVORY DILATION  09/16/2020   Procedure: SAVORY DILATION;  Surgeon: Marguerita Merles, Reuel Boom, MD;  [facility];  Service: Gastroenterology;;   TONSILLECTOMY     TOTAL KNEE ARTHROPLASTY     2011 rt knee   TOTAL KNEE ARTHROPLASTY Left 04/16/2018   Procedure: LEFT TOTAL KNEE ARTHROPLASTY;  Surgeon: Ollen Gross, MD;  [facility];  Service: Orthopedics;  Laterality: Left;   UPPER GASTROINTESTINAL ENDOSCOPY      Family Psychiatric History: See below  Family History:  Family History  Problem Relation Age of Onset   Alcohol abuse Father    Diabetes Father    Stroke Father    Depression Father    Hypertension Father    Heart attack Father    High Cholesterol Father    Alcohol abuse Maternal Grandfather    Alcohol abuse Maternal Grandmother    Hypertension Maternal Grandmother    Alcohol abuse Paternal Grandfather    Alcohol abuse Paternal Grandmother    Diabetes Paternal Grandmother    Stroke Mother    Irritable bowel syndrome Mother    Sexual abuse Mother    Hypertension Mother    Breast cancer Mother    Diabetes Brother    Healthy Brother    Diabetes Brother    Heart disease Brother    Sarcoidosis Brother    Heart failure Brother    Anxiety disorder Paternal Uncle    Alcohol abuse Paternal Uncle    Alcohol abuse Cousin    Anxiety disorder Maternal Uncle    ADD / ADHD Neg Hx    Bipolar disorder Neg Hx    Dementia Neg Hx    Drug abuse Neg Hx    Paranoid behavior Neg Hx    Schizophrenia Neg Hx    Seizures Neg Hx    Physical abuse Neg Hx     Social History:  Social History   Socioeconomic History   Marital status: Widowed    Spouse name: Not on file   Number of children: Not on file   Years of education: Not on file   Highest education level: Not on file   Occupational History   Not on file  Tobacco Use   Smoking status: Former    Current packs/day: 0.00    Average packs/day: 2.0 packs/day for 15.0 years (30.0 ttl pk-yrs)    Types: Cigarettes    Start date: 09/06/1967    Quit date: 09/05/1982    Years since quitting: 40.7    Passive exposure: Never   Smokeless tobacco: Never  Vaping Use   Vaping status: Never Used  Substance and Sexual Activity   Alcohol use: No    Alcohol/week: 0.0 standard drinks of alcohol    Comment: history  of alcoholism; pt states has been sober 28 years   Drug use: No   Sexual activity: Never  Other Topics Concern   Not on file  Social History Narrative   Not on file   Social Determinants of Health   Financial Resource Strain: Not on file  Food Insecurity: Not on file  Transportation Needs: Not on file  Physical Activity: Not on file  Stress: Not on file  Social Connections: Not on file    Allergies:  Allergies  Allergen Reactions   Elavil [Amitriptyline] Other (See Comments)    Vivid dreams and almost suicidal   Flagyl [Metronidazole] Itching   Vistaril [Hydroxyzine Hcl] Other (See Comments)    Got higher than a kite on too much of this.   Geodon [Ziprasidone Hydrochloride] Other (See Comments)    Blacked out   Lactose Intolerance (Gi) Other (See Comments)    GI upset   Latex Other (See Comments)    Red at site   Other Other (See Comments)    Metabolic Disorder Labs: Lab Results  Component Value Date   HGBA1C 6.1 (H) 10/13/2015   MPG 128 10/13/2015   No results found for: "PROLACTIN" No results found for: "CHOL", "TRIG", "HDL", "CHOLHDL", "VLDL", "LDLCALC" Lab Results  Component Value Date   TSH 2.400 03/19/2014    Therapeutic Level Labs: No results found for: "LITHIUM" No results found for: "VALPROATE" No results found for: "CBMZ"  Current Medications: Current Outpatient Medications  Medication Sig Dispense Refill   acetaminophen (TYLENOL) 500 MG tablet Take 500 mg by  mouth every 6 (six) hours as needed for moderate pain.     albuterol (VENTOLIN HFA) 108 (90 Base) MCG/ACT inhaler INHALE TWO PUFFS BY MOUTH INTO LUNGS EVERY 4 HOURS AS NEEDED FOR SHORTNESS OF BREATH OR wheezing 8.5 g 3   azelastine (ASTELIN) 0.1 % nasal spray Place 2 sprays into both nostrils 2 (two) times daily as needed (runny nose or sneezing). 30 mL 5   Azelastine HCl 0.15 % SOLN SPRAY 2 SPRAYS INTO EACH NOSTRIL EVERY DAY 30 mL 5   CALCIUM MAGNESIUM ZINC PO Take 1 tablet by mouth daily.     Cholecalciferol (VITAMIN D-3) 25 MCG (1000 UT) CAPS Take 1,000 Units by mouth daily.     clobetasol (TEMOVATE) 0.05 % external solution Apply 1 Application topically 2 (two) times daily as needed.     [employer] Omega-3 Krill Oil 350 MG CAPS Take 350 mg by mouth daily.     Cyanocobalamin (B-12 PO) Place 1 drop under the tongue daily.     dexlansoprazole (DEXILANT) 60 MG capsule Take 1 capsule (60 mg total) by mouth daily. 30 capsule 11   diclofenac sodium (VOLTAREN) 1 % GEL Apply 2 g topically 4 (four) times daily as needed (pain).     dicyclomine (BENTYL) 10 MG capsule Take 1 capsule (10 mg total) by mouth 4 (four) times daily -  before meals and at bedtime. 360 capsule 3   diphenoxylate-atropine (LOMOTIL) 2.5-0.025 MG tablet Take 1 tablet by mouth 3 (three) times daily as needed. 60 tablet 1   fluticasone (FLONASE) 50 MCG/ACT nasal spray Place 2 sprays into both nostrils daily as needed (for a stuffy nose). 16 g 5   Fluticasone-Umeclidin-Vilant (TRELEGY ELLIPTA) 200-62.5-25 MCG/ACT AEPB Take 1 puff once a day to control asthma symptoms 1 each 5   folic acid (FOLVITE) 1 MG tablet Take 1 mg by mouth daily.     gabapentin (NEURONTIN) 100 MG capsule Take 200-300  mg by mouth See admin instructions. Take 200mg  by mouth every day in the morning and take 300mg  by mouth at bedtime     Glucosamine-Chondroitin (GLUCOSAMINE CHONDR COMPLEX PO) Take 6 tablets by mouth daily. Glucosamine 1500mg  and Chondroitin sulfate 1200mg       losartan (COZAAR) 50 MG tablet Take by mouth daily. Taking 1.5 Tablets Daily     methotrexate (RHEUMATREX) 2.5 MG tablet Take 15 mg by mouth once a week.     METHOTREXATE PO Take 6 tablets by mouth once a week.     metroNIDAZOLE (METROGEL) 1 % gel Apply 1 application  topically at bedtime.     montelukast (SINGULAIR) 10 MG tablet TAKE ONE TABLET BY MOUTH EVERYDAY AT BEDTIME 30 tablet 5   Multiple Vitamins-Minerals (HAIR SKIN AND NAILS FORMULA) TABS Take 2 tablets by mouth daily.     omega-3 acid ethyl esters (LOVAZA) 1 g capsule Take 1 capsule by mouth 2 (two) times daily.     ONETOUCH VERIO test strip 1 each by Other route as needed.     rosuvastatin (CRESTOR) 20 MG tablet Take 20 mg by mouth daily.     Turmeric (QC TUMERIC COMPLEX) 500 MG CAPS Take 500 mg by mouth daily.     vitamin E 400 UNIT capsule Take 400 Units daily by mouth.     hydrOXYzine (ATARAX) 10 MG tablet Take 1 tablet (10 mg total) by mouth 3 (three) times daily as needed. 90 tablet 2   lamoTRIgine (LAMICTAL) 100 MG tablet Take 1 tablet (100 mg total) by mouth 2 (two) times daily. 180 tablet 2   LORazepam (ATIVAN) 0.5 MG tablet Take 0.5 tablets (0.25 mg total) by mouth daily as needed for anxiety. 15 tablet 2   venlafaxine XR (EFFEXOR-XR) 150 MG 24 hr capsule TAKE TWO CAPSULES BY MOUTH ONCE DAILY 60 capsule 2   No current facility-administered medications for this visit.     Musculoskeletal: Strength & Muscle Tone: within normal limits Gait & Station: normal Patient leans: N/A  Psychiatric Specialty Exam: Review of Systems  Constitutional:  Positive for fatigue.  Gastrointestinal:  Positive for constipation and diarrhea.  Musculoskeletal:  Positive for arthralgias and myalgias.  All other systems reviewed and are negative.   Blood pressure (!) 145/82, pulse 89, height 5\' 6"  (1.676 m), weight 151 lb 12.8 oz (68.9 kg), SpO2 98%.Body mass index is 24.5 kg/m.  General Appearance: Casual and Fairly Groomed  Eye  Contact:  Good  Speech:  Clear and Coherent  Volume:  Normal  Mood:  Euthymic but somewhat anxious  Affect:  Congruent  Thought Process:  Goal Directed  Orientation:  Full (Time, Place, and Person)  Thought Content: Rumination   Suicidal Thoughts:  No  Homicidal Thoughts:  No  Memory:  Immediate;   Good Recent;   Good Remote;   NA  Judgement:  Good  Insight:  Good  Psychomotor Activity:  Normal  Concentration:  Concentration: Fair and Attention Span: Fair  Recall:  Good  Fund of Knowledge: Good  Language: Good  Akathisia:  No  Handed:  Right  AIMS (if indicated): not done  Assets:  Communication Skills Desire for Improvement Resilience Social Support Talents/Skills  ADL's:  Intact  Cognition: WNL  Sleep:  Good   Screenings: AUDIT    Flowsheet Row [facility]  Alcohol Use Disorder Identification Test Final Score (AUDIT) 0      GAD-7    Flowsheet Row  Office Visit from 05/09/2023 in [facility] [place] Counselor from 02/23/2023 in [facility] at [place] Office Visit from 02/09/2023 in [facility] at [place] Counselor from 10/21/2020 in [facility] at [place] Counselor from 06/20/2018 in [facility] at [place]  Total GAD-7 Score 11 11 16 10 16       PHQ2-9    Flowsheet Row Office Visit from 05/09/2023 in [facility] [place] Counselor from 02/23/2023 in [facility] at [place] Office Visit from 02/09/2023 in [facility] [place] Counselor from 12/22/2022 in [facility] at [place] Counselor from 09/06/2022 in [facility] at [facility] Total Score 5 3 4 2 1   PHQ-9 Total Score 20 13 19 11   --      Flowsheet Row Counselor from 02/23/2023 in [facility] [place] Admission (Discharged) from 07/11/2022 in [place] [facility] Pre-Admission Testing 60 from 07/06/2022 in [facility]  C-SSRS RISK CATEGORY Low Risk No Risk No Risk        Assessment and Plan: This patient is a 72 year old female with a history of depression anxiety and possible bipolar disorder.  For the most part she is doing well although she is sad about the death of her brother-in-law.  For now she will continue Lamictal 100 mg twice daily for mood stabilization, Effexor XR 300 mg daily for depression.  She occasionally uses lorazepam 0.25 mg for anxiety or sleep and hydroxyzine 10 mg daily as needed for anxiety or itching.  She will return to see me in 3 months  Collaboration of Care: Collaboration of Care: Referral or follow-up with counselor/therapist AEB patient will continue therapy with Florencia Reasons in our office  Patient/Guardian was advised Release of Information must be obtained prior to any record release in order to collaborate their care with an outside provider. Patient/Guardian was advised if they have not already done so to contact the registration department to sign all necessary forms in order for [place] to release information regarding their care.   Consent: Patient/Guardian gives verbal consent for treatment and assignment of benefits for services provided during this visit. Patient/Guardian expressed understanding and agreed to proceed.    Diannia Ruder, MD 05/09/2023, 3:06 PM

## 2023-05-12 DIAGNOSIS — U071 COVID-19: Secondary | ICD-10-CM | POA: Diagnosis not present

## 2023-05-12 DIAGNOSIS — E119 Type 2 diabetes mellitus without complications: Secondary | ICD-10-CM | POA: Diagnosis not present

## 2023-05-12 DIAGNOSIS — J4520 Mild intermittent asthma, uncomplicated: Secondary | ICD-10-CM | POA: Diagnosis not present

## 2023-05-16 ENCOUNTER — Ambulatory Visit (HOSPITAL_COMMUNITY): Payer: Medicare Other | Admitting: Psychiatry

## 2023-05-25 NOTE — Progress Notes (Signed)
Referring Provider: Aliene Beams, MD Primary Care Physician:  Aliene Beams, MD Primary GI Physician: Dr. Marletta Lor  Chief Complaint  Patient presents with   Follow-up    Follow up. No problems     HPI:   Lauren [name] is a 72 y.o. female with history of GERD, IBS-D, hepatitis C s/p treatment with Harvoni, presenting today for follow-up.  Last seen in the office 11/23/2022.  Chronic GERD was controlled with Dexilant 60 mg daily.  She had previously been on pantoprazole, Nexium, and Prilosec without relief.  No recurrent dysphagia following prior empiric dilation in 2023.  IBS was fairly well-controlled with dicyclomine and Lomotil as needed.  Noted nuts and dairy as triggers.  She was advised to continue her current medications, avoid dairy or use over-the-counter Lactaid, follow-up in 6 months.  Today:  GERD:  Well controlled on Dexilant 60 mg daily. No nausea, vomiting, dysphagia. Occasional abdominal discomfort if she over eats.   IBS-D:  Well controlled with dicyclomine. Usually takes 1-2 a day and does well. Depending on what she eats, she will take an extra. Lomotil as needed. Not frequent. No brbpr or melena.     Colonoscopy 07/11/2022 with 15 mm cecal polyp, sessile serrated on pathology.  Recommend repeat 3 years.  Colon biopsies negative for microscopic colitis.   Last EGD 09/16/2020 with small hiatal hernia, findings suspicious for Barrett's esophagus though biopsies negative, duodenal biopsies negative.  Empiric dilation performed up to 18 mm.  Past Medical History:  Diagnosis Date   Anxiety    Chronic pain    COPD (chronic obstructive pulmonary disease) (HCC)    Depression    Diabetes mellitus without complication (HCC)    Fibromyalgia    Fibromyalgia    GERD (gastroesophageal reflux disease)    Headache    Hepatitis C    History of blood transfusion    History of bronchitis    IBS (irritable bowel syndrome)    Osteoarthritis    Pneumonia    Prediabetes     Shortness of breath dyspnea    allergies; increased pain;    Vertigo    Wears glasses     Past Surgical History:  Procedure Laterality Date   BIOPSY  09/16/2020   Procedure: BIOPSY;  Surgeon: Dolores Frame, MD;  [facility];  Service: Gastroenterology;;   BIOPSY  07/11/2022   Procedure: BIOPSY;  Surgeon: Lanelle Bal, DO;  [facility]: [facility];  Service: Endoscopy;;   CATARACT EXTRACTION W/PHACO Right 08/01/2017   Procedure: CATARACT EXTRACTION PHACO AND INTRAOCULAR LENS PLACEMENT RIGHT EYE;  Surgeon: Jethro Bolus, MD;  Location: [facility];  Service: Ophthalmology;  Laterality: Right;  CDE: 3.30   CATARACT EXTRACTION W/PHACO Left 08/15/2017   Procedure: CATARACT EXTRACTION PHACO AND INTRAOCULAR LENS PLACEMENT (IOC);  Surgeon: Jethro Bolus, MD;  Location: [facility];  Service: Ophthalmology;  Laterality: Left;  CDE: 4.00   CHOLECYSTECTOMY     COLONOSCOPY     COLONOSCOPY N/A 10/23/2014   Procedure: COLONOSCOPY;  Surgeon: Malissa Hippo, MD;  [facility];  Service: Endoscopy;  Laterality: N/A;  1030   COLONOSCOPY WITH PROPOFOL N/A 07/11/2022   Procedure: COLONOSCOPY WITH PROPOFOL;  Surgeon: Lanelle Bal, DO;  [facility];  Service: Endoscopy;  Laterality: N/A;  2:30pm, asa 3   ESOPHAGOGASTRODUODENOSCOPY N/A 10/23/2014   Procedure: ESOPHAGOGASTRODUODENOSCOPY (EGD);  Surgeon: Malissa Hippo, MD;  [facility];  Service: Endoscopy;  Laterality: N/A;  ESOPHAGOGASTRODUODENOSCOPY (EGD) WITH PROPOFOL N/A 09/16/2020   Procedure: ESOPHAGOGASTRODUODENOSCOPY (EGD) WITH PROPOFOL;  Surgeon: Dolores Frame, MD;  [facility];  Service: Gastroenterology;  Laterality: N/A;  7:30   KNEE ARTHROSCOPY Left 10/21/2015   Procedure: ARTHROSCOPY LEFT KNEE WITH MENICAL DEBRIDEMENT, Chondroplasty;  Surgeon: Ollen Gross, MD;  Location: [facility];  Service: Orthopedics;  Laterality: Left;   MANDIBLE SURGERY  09/06/1979   ORIF WRIST  FRACTURE Right 07/23/2013   Procedure: OPEN REDUCTION INTERNAL FIXATION (ORIF) WRIST FRACTURE;  Surgeon: Dominica Severin, MD;  Location: [facility];  Service: Orthopedics;  Laterality: Right;   SAVORY DILATION  09/16/2020   Procedure: SAVORY DILATION;  Surgeon: Marguerita Merles, Reuel Boom, MD;  [facility];  Service: Gastroenterology;;   TONSILLECTOMY     TOTAL KNEE ARTHROPLASTY     2011 rt knee   TOTAL KNEE ARTHROPLASTY Left 04/16/2018   Procedure: LEFT TOTAL KNEE ARTHROPLASTY;  Surgeon: Ollen Gross, MD;  Location: [facility];  Service: Orthopedics;  Laterality: Left;   UPPER GASTROINTESTINAL ENDOSCOPY      Current Outpatient Medications  Medication Sig Dispense Refill   albuterol (VENTOLIN HFA) 108 (90 Base) MCG/ACT inhaler INHALE TWO PUFFS BY MOUTH INTO LUNGS EVERY 4 HOURS AS NEEDED FOR SHORTNESS OF BREATH OR wheezing 8.5 g 3   azelastine (ASTELIN) 0.1 % nasal spray Place 2 sprays into both nostrils 2 (two) times daily as needed (runny nose or sneezing). 30 mL 5   Azelastine HCl 0.15 % SOLN SPRAY 2 SPRAYS INTO EACH NOSTRIL EVERY DAY 30 mL 5   CALCIUM MAGNESIUM ZINC PO Take 1 tablet by mouth daily.     Cholecalciferol (VITAMIN D-3) 25 MCG (1000 UT) CAPS Take 1,000 Units by mouth daily.     [employer] Omega-3 Krill Oil 350 MG CAPS Take 350 mg by mouth daily.     Cyanocobalamin (B-12 PO) Place 1 drop under the tongue daily.     dexlansoprazole (DEXILANT) 60 MG capsule Take 1 capsule (60 mg total) by mouth daily. 30 capsule 11   diclofenac sodium (VOLTAREN) 1 % GEL Apply 2 g topically 4 (four) times daily as needed (pain).     dicyclomine (BENTYL) 10 MG capsule Take 1 capsule (10 mg total) by mouth 4 (four) times daily -  before meals and at bedtime. 360 capsule 3   diphenoxylate-atropine (LOMOTIL) 2.5-0.025 MG tablet Take 1 tablet by mouth 3 (three) times daily as needed. 60 tablet 1   fluticasone (FLONASE) 50 MCG/ACT nasal spray Place 2 sprays into both nostrils daily as needed (for a stuffy  nose). 16 g 5   Fluticasone-Umeclidin-Vilant (TRELEGY ELLIPTA) 200-62.5-25 MCG/ACT AEPB Take 1 puff once a day to control asthma symptoms 1 each 5   folic acid (FOLVITE) 1 MG tablet Take 1 mg by mouth daily.     gabapentin (NEURONTIN) 100 MG capsule Take 200-300 mg by mouth See admin instructions. Take 200mg  by mouth every day in the morning and take 300mg  by mouth at bedtime     Glucosamine-Chondroitin (GLUCOSAMINE CHONDR COMPLEX PO) Take 6 tablets by mouth daily. Glucosamine 1500mg  and Chondroitin sulfate 1200mg      hydrOXYzine (ATARAX) 10 MG tablet Take 1 tablet (10 mg total) by mouth 3 (three) times daily as needed. 90 tablet 2   lamoTRIgine (LAMICTAL) 100 MG tablet Take 1 tablet (100 mg total) by mouth 2 (two) times daily. 180 tablet 2   LORazepam (ATIVAN) 0.5 MG tablet Take 0.5 tablets (0.25 mg total) by mouth daily as needed  for anxiety. 15 tablet 2   losartan (COZAAR) 50 MG tablet Take by mouth daily. Taking 1.5 Tablets Daily     methotrexate (RHEUMATREX) 2.5 MG tablet Take 15 mg by mouth once a week.     METHOTREXATE PO Take 6 tablets by mouth once a week.     metroNIDAZOLE (METROGEL) 1 % gel Apply 1 application  topically at bedtime.     montelukast (SINGULAIR) 10 MG tablet TAKE ONE TABLET BY MOUTH EVERYDAY AT BEDTIME 30 tablet 5   Multiple Vitamins-Minerals (HAIR SKIN AND NAILS FORMULA) TABS Take 2 tablets by mouth daily.     omega-3 acid ethyl esters (LOVAZA) 1 g capsule Take 1 capsule by mouth 2 (two) times daily.     ONETOUCH VERIO test strip 1 each by Other route as needed.     rosuvastatin (CRESTOR) 20 MG tablet Take 20 mg by mouth daily.     venlafaxine XR (EFFEXOR-XR) 150 MG 24 hr capsule TAKE TWO CAPSULES BY MOUTH ONCE DAILY 60 capsule 2   vitamin E 400 UNIT capsule Take 400 Units daily by mouth.     acetaminophen (TYLENOL) 500 MG tablet Take 500 mg by mouth every 6 (six) hours as needed for moderate pain.     clobetasol (TEMOVATE) 0.05 % external solution Apply 1 Application  topically 2 (two) times daily as needed.     No current facility-administered medications for this visit.    Allergies as of 05/26/2023 - Review Complete 05/26/2023  Allergen Reaction Noted   Elavil [amitriptyline] Other (See Comments) 10/10/2012   Flagyl [metronidazole] Itching 02/14/2012   Vistaril [hydroxyzine hcl] Other (See Comments) 07/25/2012   Geodon [ziprasidone hydrochloride] Other (See Comments) 07/19/2011   Lactose intolerance (gi) Other (See Comments) 07/19/2011   Latex Other (See Comments) 10/12/2015   Other Other (See Comments) 10/19/2020    Family History  Problem Relation Age of Onset   Alcohol abuse Father    Diabetes Father    Stroke Father    Depression Father    Hypertension Father    Heart attack Father    High Cholesterol Father    Alcohol abuse Maternal Grandfather    Alcohol abuse Maternal Grandmother    Hypertension Maternal Grandmother    Alcohol abuse Paternal Grandfather    Alcohol abuse Paternal Grandmother    Diabetes Paternal Grandmother    Stroke Mother    Irritable bowel syndrome Mother    Sexual abuse Mother    Hypertension Mother    Breast cancer Mother    Diabetes Brother    Healthy Brother    Diabetes Brother    Heart disease Brother    Sarcoidosis Brother    Heart failure Brother    Anxiety disorder Paternal Uncle    Alcohol abuse Paternal Uncle    Alcohol abuse Cousin    Anxiety disorder Maternal Uncle    ADD / ADHD Neg Hx    Bipolar disorder Neg Hx    Dementia Neg Hx    Drug abuse Neg Hx    Paranoid behavior Neg Hx    Schizophrenia Neg Hx    Seizures Neg Hx    Physical abuse Neg Hx     Social History   Socioeconomic History   Marital status: Widowed    Spouse name: Not on file   Number of children: Not on file   Years of education: Not on file   Highest education level: Not on file  Occupational History   Not on file  Tobacco Use   Smoking status: Former    Current packs/day: 0.00    Average packs/day: 2.0  packs/day for 15.0 years (30.0 ttl pk-yrs)    Types: Cigarettes    Start date: 09/06/1967    Quit date: 09/05/1982    Years since quitting: 40.7    Passive exposure: Never   Smokeless tobacco: Never  Vaping Use   Vaping status: Never Used  Substance and Sexual Activity   Alcohol use: No    Alcohol/week: 0.0 standard drinks of alcohol    Comment: history of alcoholism; pt states has been sober 28 years   Drug use: No   Sexual activity: Never  Other Topics Concern   Not on file  Social History Narrative   Not on file   Social Determinants of Health   Financial Resource Strain: Not on file  Food Insecurity: Not on file  Transportation Needs: Not on file  Physical Activity: Not on file  Stress: Not on file  Social Connections: Not on file    Review of Systems: Gen: Denies fever, chills.  Admits to cough and nasal congestion following recent COVID infection. CV: Denies chest pain, palpitations. Resp: Denies dyspnea. GI: See HPI Heme: See HPI  Physical Exam: BP 137/77 (BP Location: Right Arm, Patient Position: Sitting, Cuff Size: Normal)   Pulse (!) 102   Temp 97.9 F (36.6 C) (Temporal)   Ht 5\' 5"  (1.651 m)   Wt 149 lb 6.4 oz (67.8 kg)   BMI 24.86 kg/m  General:   Alert and oriented. No distress noted. Pleasant and cooperative.  Head:  Normocephalic and atraumatic. Eyes:  Conjuctiva clear without scleral icterus. Heart:  S1, S2 present without murmurs appreciated. Lungs:  Clear to auscultation bilaterally. No wheezes, rales, or rhonchi. No distress.  Abdomen:  +BS, soft, non-tender and non-distended. No rebound or guarding. No HSM or masses noted. Msk:  Symmetrical without gross deformities. Normal posture. Extremities:  Without edema. Neurologic:  Alert and  oriented x4 Psych:  Normal mood and affect.    Assessment:  72 y.o. female with history of GERD, IBS-D, hepatitis C s/p treatment with Harvoni, presenting today for 1 yr follow-up.   GERD:  Well-controlled  on Dexilant 60 mg daily.  No alarm symptoms.  IBS-D:  Well-controlled on dicyclomine daily and Lomotil as needed.  No alarm symptoms.  Plan:  Continue Dexilant 60 mg daily. Continue dicyclomine up to 4 times daily before meals and at bedtime.  Continue Lomotil as needed. Follow-up in 1 year or sooner if needed.    Ermalinda Memos, PA-C [facility] 05/26/2023

## 2023-05-26 ENCOUNTER — Encounter: Payer: Self-pay | Admitting: Gastroenterology

## 2023-05-26 ENCOUNTER — Ambulatory Visit (INDEPENDENT_AMBULATORY_CARE_PROVIDER_SITE_OTHER): Payer: Medicare Other | Admitting: Gastroenterology

## 2023-05-26 VITALS — BP 137/77 | HR 102 | Temp 97.9°F | Ht 65.0 in | Wt 149.4 lb

## 2023-05-26 DIAGNOSIS — K580 Irritable bowel syndrome with diarrhea: Secondary | ICD-10-CM | POA: Diagnosis not present

## 2023-05-26 DIAGNOSIS — K219 Gastro-esophageal reflux disease without esophagitis: Secondary | ICD-10-CM

## 2023-05-26 NOTE — Patient Instructions (Signed)
Continue Dexilant 60 mg daily.  Continue dicyclomine and Lomotil as needed for IBS.  I am glad you are doing well overall!  We will plan to see you back in 1 year or sooner if needed.  Ermalinda Memos, PA-C [facility]

## 2023-05-29 ENCOUNTER — Other Ambulatory Visit (HOSPITAL_COMMUNITY): Payer: Self-pay | Admitting: Psychiatry

## 2023-06-01 ENCOUNTER — Ambulatory Visit (HOSPITAL_COMMUNITY): Payer: Medicare Other

## 2023-06-01 ENCOUNTER — Encounter (HOSPITAL_COMMUNITY): Payer: Medicare Other

## 2023-06-01 ENCOUNTER — Encounter (HOSPITAL_COMMUNITY): Payer: Self-pay

## 2023-06-06 ENCOUNTER — Ambulatory Visit (HOSPITAL_COMMUNITY): Payer: Medicare Other

## 2023-06-06 ENCOUNTER — Encounter (HOSPITAL_COMMUNITY): Payer: Medicare Other

## 2023-06-13 ENCOUNTER — Other Ambulatory Visit (HOSPITAL_COMMUNITY): Payer: Self-pay | Admitting: Psychiatry

## 2023-06-26 ENCOUNTER — Ambulatory Visit (INDEPENDENT_AMBULATORY_CARE_PROVIDER_SITE_OTHER): Payer: Medicare Other | Admitting: Psychiatry

## 2023-06-26 DIAGNOSIS — F332 Major depressive disorder, recurrent severe without psychotic features: Secondary | ICD-10-CM | POA: Diagnosis not present
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
## 2023-07-05 ENCOUNTER — Encounter (HOSPITAL_COMMUNITY): Payer: Self-pay | Admitting: *Deleted

## 2023-07-05 ENCOUNTER — Telehealth (HOSPITAL_COMMUNITY): Payer: Self-pay | Admitting: *Deleted

## 2023-07-05 NOTE — Telephone Encounter (Addendum)
Patient called stating she is completely out of her LORazepam (ATIVAN) 0.5 MG tablet and need refills

## 2023-07-07 ENCOUNTER — Other Ambulatory Visit (HOSPITAL_COMMUNITY): Payer: Self-pay | Admitting: Psychiatry

## 2023-07-07 MED ORDER — LORAZEPAM 0.5 MG PO TABS: 0.2500 mg | ORAL_TABLET | Freq: Every day | ORAL | 2 refills | Status: DC | PRN

## 2023-07-07 NOTE — Telephone Encounter (Signed)
sent 

## 2023-07-10 ENCOUNTER — Ambulatory Visit (INDEPENDENT_AMBULATORY_CARE_PROVIDER_SITE_OTHER): Payer: Medicare Other | Admitting: Psychiatry

## 2023-07-10 DIAGNOSIS — F332 Major depressive disorder, recurrent severe without psychotic features: Secondary | ICD-10-CM
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
## 2023-07-14 ENCOUNTER — Telehealth: Payer: Self-pay | Admitting: Family Medicine

## 2023-07-14 NOTE — Telephone Encounter (Signed)
Patient needs a new prescription called in for her Montelukast 10 mg to [facility]--patient call back is [phone]

## 2023-07-17 NOTE — Telephone Encounter (Signed)
Pt returning missed call. 

## 2023-07-17 NOTE — Telephone Encounter (Signed)
This patient has not been seen in our office since 05/17/2021, she will need an office visit before we can refill her medications.  ATC patient x1.  LVM to return call.

## 2023-07-18 MED ORDER — TRELEGY ELLIPTA 200-62.5-25 MCG/ACT IN AEPB: 1.0000 | INHALATION_SPRAY | Freq: Every day | RESPIRATORY_TRACT | 0 refills | Status: DC

## 2023-07-18 MED ORDER — MONTELUKAST SODIUM 10 MG PO TABS: 10.0000 mg | ORAL_TABLET | Freq: Every day | ORAL | 0 refills | Status: DC

## 2023-07-18 NOTE — Telephone Encounter (Signed)
Called and spoke with patient, advised patient that we have not seen her since 05/2021 and she would need an OV for further refills.  She stated she made a f/u for the [place] office on 08/08/2023.  I let her know that I would send in a months supply of the montelukast and Trelegy until she can be seen in the office.  She said she wanted the montelukast sent to [facility] and the Trelegy sent to [facility].  She said the Trelegy is very expensive, possibly d/t the donut hole.  Advised when she comes into the office to have [place] print out patient assistance for Trelegy to see if she qualifies.  Also advised the office may have samples.  Scripts sent.  Nothing further needed.

## 2023-07-19 DIAGNOSIS — Z79899 Other long term (current) drug therapy: Secondary | ICD-10-CM | POA: Diagnosis not present

## 2023-07-19 DIAGNOSIS — M1380 Other specified arthritis, unspecified site: Secondary | ICD-10-CM | POA: Diagnosis not present

## 2023-07-19 DIAGNOSIS — M25539 Pain in unspecified wrist: Secondary | ICD-10-CM | POA: Diagnosis not present

## 2023-07-19 DIAGNOSIS — M810 Age-related osteoporosis without current pathological fracture: Secondary | ICD-10-CM | POA: Diagnosis not present

## 2023-07-19 DIAGNOSIS — M1990 Unspecified osteoarthritis, unspecified site: Secondary | ICD-10-CM | POA: Diagnosis not present

## 2023-07-19 DIAGNOSIS — E119 Type 2 diabetes mellitus without complications: Secondary | ICD-10-CM | POA: Diagnosis not present

## 2023-07-19 DIAGNOSIS — Z23 Encounter for immunization: Secondary | ICD-10-CM | POA: Diagnosis not present

## 2023-07-24 ENCOUNTER — Encounter (HOSPITAL_COMMUNITY): Payer: Self-pay

## 2023-07-24 ENCOUNTER — Ambulatory Visit (HOSPITAL_COMMUNITY): Payer: Medicare Other | Admitting: Psychiatry

## 2023-08-07 NOTE — Progress Notes (Unsigned)
Lauren [name], female    DOB: 1951-07-15    MRN: [id number]   Brief patient profile:  72  yowf  quit smoking 1984  Sood pt self-referred to pulmonary clinic in [place]  08/08/2023    for asthma/rhinitis  since her 57s  with allergy testing pos for Dog / mold      History of Present Illness  08/08/2023  Pulmonary/ 1st office eval/ Lauren [name] / [facility] maint on Trelegy 200 / singulair and dog still in [employer], retail Complaint  Patient presents with   Establish Care   Asthma  Dyspnea:  long walks some hills min doe / uses stationery bike once a week s doe  Cough: forever tendency to cough and clear throat day >> noct Sleep: bed is flat 2 pillows s resp cc  SABA use: only p ex   02: none   No obvious day to day or daytime pattern/variability or assoc excess/ purulent sputum or mucus plugs or hemoptysis or cp or chest tightness, subjective wheeze or overt sinus or hb symptoms.    Also denies any obvious fluctuation of symptoms with weather or environmental changes or other aggravating or alleviating factors except as outlined above   No unusual exposure hx or h/o childhood pna/ asthma or knowledge of premature birth.  Current Allergies, Complete Past Medical History, Past Surgical History, Family History, and Social History were reviewed in [employer] record.  ROS  The following are not active complaints unless bolded Hoarseness, sore throat, dysphagia, dental problems, itching, sneezing,  nasal congestion or discharge of excess mucus or purulent secretions, ear ache,   fever, chills, sweats, unintended wt loss or wt gain, classically pleuritic or exertional cp,  orthopnea pnd or arm/hand swelling  or leg swelling, presyncope, palpitations, abdominal pain, anorexia, nausea, vomiting, diarrhea  or change in bowel habits or change in bladder habits, change in stools or change in urine, dysuria, hematuria,  rash, arthralgias, visual complaints, headache,  numbness, weakness or ataxia or problems with walking or coordination,  change in mood or  memory.              Outpatient Medications Prior to Visit  Medication Sig Dispense Refill   acetaminophen (TYLENOL) 500 MG tablet Take 500 mg by mouth every 6 (six) hours as needed for moderate pain.     albuterol (VENTOLIN HFA) 108 (90 Base) MCG/ACT inhaler INHALE TWO PUFFS BY MOUTH INTO LUNGS EVERY 4 HOURS AS NEEDED FOR SHORTNESS OF BREATH OR wheezing 8.5 g 3   azelastine (ASTELIN) 0.1 % nasal spray Place 2 sprays into both nostrils 2 (two) times daily as needed (runny nose or sneezing). 30 mL 5   Azelastine HCl 0.15 % SOLN SPRAY 2 SPRAYS INTO EACH NOSTRIL EVERY DAY 30 mL 5   CALCIUM MAGNESIUM ZINC PO Take 1 tablet by mouth daily.     Cholecalciferol (VITAMIN D-3) 25 MCG (1000 UT) CAPS Take 1,000 Units by mouth daily.     clobetasol (TEMOVATE) 0.05 % external solution Apply 1 Application topically 2 (two) times daily as needed.     [employer] Omega-3 Krill Oil 350 MG CAPS Take 350 mg by mouth daily.     Cyanocobalamin (B-12 PO) Place 1 drop under the tongue daily.     dexlansoprazole (DEXILANT) 60 MG capsule Take 1 capsule (60 mg total) by mouth daily. 30 capsule 11   diclofenac sodium (VOLTAREN) 1 % GEL Apply 2 g topically 4 (four) times daily as needed (  pain).     diphenoxylate-atropine (LOMOTIL) 2.5-0.025 MG tablet Take 1 tablet by mouth 3 (three) times daily as needed. 60 tablet 1   fluticasone (FLONASE) 50 MCG/ACT nasal spray Place 2 sprays into both nostrils daily as needed (for a stuffy nose). 16 g 5   Fluticasone-Umeclidin-Vilant (TRELEGY ELLIPTA) 200-62.5-25 MCG/ACT AEPB Inhale 1 puff into the lungs daily at 6 (six) AM. 60 each 0   folic acid (FOLVITE) 1 MG tablet Take 1 mg by mouth daily.     gabapentin (NEURONTIN) 100 MG capsule Take 200-300 mg by mouth See admin instructions. Take 200mg  by mouth every day in the morning and take 300mg  by mouth at bedtime     Glucosamine-Chondroitin (GLUCOSAMINE  CHONDR COMPLEX PO) Take 6 tablets by mouth daily. Glucosamine 1500mg  and Chondroitin sulfate 1200mg      hydrOXYzine (ATARAX) 10 MG tablet Take 1 tablet (10 mg total) by mouth 3 (three) times daily as needed. 90 tablet 2   lamoTRIgine (LAMICTAL) 100 MG tablet Take 1 tablet (100 mg total) by mouth 2 (two) times daily. 180 tablet 2   LORazepam (ATIVAN) 0.5 MG tablet Take 0.5 tablets (0.25 mg total) by mouth daily as needed for anxiety. 15 tablet 2   losartan (COZAAR) 50 MG tablet Take by mouth daily. Taking 1.5 Tablets Daily     methotrexate (RHEUMATREX) 2.5 MG tablet Take 15 mg by mouth once a week.     METHOTREXATE PO Take 6 tablets by mouth once a week.     metroNIDAZOLE (METROGEL) 1 % gel Apply 1 application  topically at bedtime.     montelukast (SINGULAIR) 10 MG tablet Take 1 tablet (10 mg total) by mouth at bedtime. 30 tablet 0   Multiple Vitamins-Minerals (HAIR SKIN AND NAILS FORMULA) TABS Take 2 tablets by mouth daily.     omega-3 acid ethyl esters (LOVAZA) 1 g capsule Take 1 capsule by mouth 2 (two) times daily.     ONETOUCH VERIO test strip 1 each by Other route as needed.     rosuvastatin (CRESTOR) 20 MG tablet Take 20 mg by mouth daily.     venlafaxine XR (EFFEXOR-XR) 150 MG 24 hr capsule TAKE 2 CAPSULES BY MOUTH EVERY DAY 180 capsule 1   vitamin E 400 UNIT capsule Take 400 Units daily by mouth.     dicyclomine (BENTYL) 10 MG capsule Take 1 capsule (10 mg total) by mouth 4 (four) times daily -  before meals and at bedtime. 360 capsule 3   Fluticasone-Umeclidin-Vilant (TRELEGY ELLIPTA) 200-62.5-25 MCG/ACT AEPB Take 1 puff once a day to control asthma symptoms 1 each 5   montelukast (SINGULAIR) 10 MG tablet TAKE ONE TABLET BY MOUTH EVERYDAY AT BEDTIME 30 tablet 5   No facility-administered medications prior to visit.    Past Medical History:  Diagnosis Date   Anxiety    Chronic pain    COPD (chronic obstructive pulmonary disease) (HCC)    Depression    Diabetes mellitus without  complication (HCC)    Fibromyalgia    Fibromyalgia    GERD (gastroesophageal reflux disease)    Headache    Hepatitis C    History of blood transfusion    History of bronchitis    IBS (irritable bowel syndrome)    Osteoarthritis    Pneumonia    Prediabetes    Shortness of breath dyspnea    allergies; increased pain;    Vertigo    Wears glasses       Objective:  BP 134/73   Pulse (!) 102   Ht 5\' 5"  (1.651 m)   Wt 153 lb (69.4 kg)   SpO2 96%   BMI 25.46 kg/m   SpO2: 96 %  RA   HEENT : Oropharynx  nl      Nasal turbinates nl    NECK :  without  apparent JVD/ palpable Nodes/TM    LUNGS: no acc muscle use,  Nl contour chest which is clear to A and P bilaterally without cough on insp or exp maneuvers   CV:  RRR  no s3 or murmur or increase in P2, and no edema   ABD:  soft and nontender with nl inspiratory excursion in the supine position. No bruits or organomegaly appreciated   MS:  Nl gait/ ext warm without deformities Or obvious joint restrictions  calf tenderness, cyanosis or clubbing    SKIN: warm and dry without lesions    NEURO:  alert, approp, nl sensorium with  no motor or cerebellar deficits apparent.       Assessment   Cough variant asthma Onset in her 103s with assoc rhinitis/ dog allergy (still has one) - 08/08/2023  After extensive coaching inhaler device,  effectiveness =    75% from a baseline of < 25% >>> try symbicort 80   DDX of  difficult airways management almost all start with A and  include Adherence, Ace Inhibitors, Acid Reflux, Active Sinus Disease, Alpha 1 Antitripsin deficiency, Anxiety masquerading as Airways dz,  ABPA,  Allergy(esp in young), Aspiration (esp in elderly), Adverse effects of meds,  Active smoking or vaping, A bunch of PE's (a small clot burden can't cause this syndrome unless there is already severe underlying pulm or vascular dz with poor reserve) plus two Bs  = Bronchiectasis and Beta blocker use..and one C= CHF    Most likely dx bolded  Adherence is always the initial "prime suspect" and is a multilayered concern that requires a "trust but verify" approach in every patient - starting with knowing how to use medications, especially inhalers, correctly, keeping up with refills and understanding the fundamental difference between maintenance and prns vs those medications only taken for a very short course and then stopped and not refilled.  - see hfa teaching  - return with all meds in hand using a trust but verify approach to confirm accurate Medication  Reconciliation The principal here is that until we are certain that the  patients are doing what we've asked, it makes no sense to ask them to do more.   ? Acid (or non-acid) GERD > always difficult to exclude as up to 75% of pts in some series report no assoc GI/ Heartburn symptoms> rec continue max (24h)  acid suppression and diet restrictions/ reviewed     ? Adverse effects of meds > stop DPI  ?allergy > minimize dog in Bedroom  Re doe:  Re SABA :  I spent extra time with pt today reviewing appropriate use of albuterol for prn use on exertion with the following points: 1) saba is for relief of sob that does not improve by walking a slower pace or resting but rather if the pt does not improve after trying this first. 2) If the pt is convinced, as many are, that saba helps recover from activity faster then it's easy to tell if this is the case by re-challenging : ie stop, take the inhaler, then p 5 minutes try the exact same activity (intensity of workload) that just  caused the symptoms and see if they are substantially diminished or not after saba 3) if there is an activity that reproducibly causes the symptoms, try the saba 15 min before the activity on alternate days   If in fact the saba really does help, then fine to continue to use it prn but advised may need to look closer at the maintenance regimen being used to achieve better control of airways  disease with exertion.    F/u  22m sooner prn          Each maintenance medication was reviewed in detail including emphasizing most importantly the difference between maintenance and prns and under what circumstances the prns are to be triggered using an action plan format where appropriate.  Total time for H and P, chart review, counseling, reviewing hfa device(s) and generating customized AVS unique to this office visit / same day charting  > 40 min with pt new to me        -     Sandrea Hughs, MD 08/08/2023

## 2023-08-08 ENCOUNTER — Encounter (HOSPITAL_COMMUNITY): Payer: Self-pay

## 2023-08-08 ENCOUNTER — Ambulatory Visit (HOSPITAL_COMMUNITY): Payer: Medicare Other | Admitting: Psychiatry

## 2023-08-08 ENCOUNTER — Ambulatory Visit (INDEPENDENT_AMBULATORY_CARE_PROVIDER_SITE_OTHER): Payer: Medicare Other | Admitting: Internal Medicine

## 2023-08-08 ENCOUNTER — Encounter: Payer: Self-pay | Admitting: Internal Medicine

## 2023-08-08 VITALS — BP 134/73 | HR 102 | Ht 65.0 in | Wt 153.0 lb

## 2023-08-08 DIAGNOSIS — J45991 Cough variant asthma: Secondary | ICD-10-CM | POA: Diagnosis not present

## 2023-08-08 MED ORDER — BUDESONIDE-FORMOTEROL FUMARATE 80-4.5 MCG/ACT IN AERO: INHALATION_SPRAY | RESPIRATORY_TRACT | 12 refills | Status: DC

## 2023-08-08 NOTE — Progress Notes (Signed)
   [address] [place] [address] Dept: [phone]  FOLLOW UP NOTE  Patient ID: Lauren [name], female    DOB: February 28, 1951  Age: 72 y.o. MRN: [id number] Date of Office Visit: 08/09/2023  Assessment  Chief Complaint: No chief complaint on file.  HPI Lauren [name] is a 72 year old female who presents to the clinic for follow-up visit.  She was last seen in this clinic on 04/05/2023 by Thermon Leyland FNP, for evaluation of asthma COPD overlap syndrome, allergic rhinitis, and reflux.   Her environmental allergy testing on 02/03/2023 was positive to indoor mold, outdoor mold, dog, and cat.  Discussed the use of AI scribe software for clinical note transcription with the patient, who gave verbal consent to proceed.  History of Present Illness             Drug Allergies:  Allergies  Allergen Reactions  . Elavil [Amitriptyline] Other (See Comments)    Vivid dreams and almost suicidal  . Flagyl [Metronidazole] Itching  . Vistaril [Hydroxyzine Hcl] Other (See Comments)    Got higher than a kite on too much of this.  Earnestine Leys [Ziprasidone Hydrochloride] Other (See Comments)    Blacked out  . Lactose Intolerance (Gi) Other (See Comments)    GI upset  . Latex Other (See Comments)    Red at site  . Other Other (See Comments)    Physical Exam: There were no vitals taken for this visit.   Physical Exam  Diagnostics:    Assessment and Plan: No diagnosis found.  No orders of the defined types were placed in this encounter.   There are no Patient Instructions on file for this visit.  No follow-ups on file.    Thank you for the opportunity to care for this patient.  Please do not hesitate to contact me with questions.  Thermon Leyland, FNP [facility]

## 2023-08-08 NOTE — Patient Instructions (Incomplete)
Allergic rhinitis Continue allergen avoidance measures directed toward indoor molds, outdoor molds, dog, and cat as listed below Continue azelastine nasal spray 2 sprays in each nostril up to twice a day as needed for a runny nose or sneezing Continue Flonase 2 sprays in each nostril once a day as needed for a stuffy nose.  In the right nostril, point the applicator out toward the right ear. In the left nostril, point the applicator out toward the left ear Consider saline nasal rinses as needed for nasal symptoms. Use this before any medicated nasal sprays for best result  Asthma/COPD overlap Continue montelukast 10 mg once a day to prevent cough or wheeze Continue Trelegy 200-1 puff once a day to prevent cough or wheeze Continue albuterol 2 puffs once every 4 hours as needed for cough or wheeze You may use albuterol 2 puffs 15 minutes before activity to decrease cough or wheeze  Reflux Continue dietary and lifestyle modifications as listed below  Call the clinic if this treatment plan is not working well for you.  Follow up in 4 months or sooner if needed.  Control of Mold Allergen Mold and fungi can grow on a variety of surfaces provided certain temperature and moisture conditions exist.  Outdoor molds grow on plants, decaying vegetation and soil.  The major outdoor mold, Alternaria and Cladosporium, are found in very high numbers during hot and dry conditions.  Generally, a late Summer - Fall peak is seen for common outdoor fungal spores.  Rain will temporarily lower outdoor mold spore count, but counts rise rapidly when the rainy period ends.  The most important indoor molds are Aspergillus and Penicillium.  Dark, humid and poorly ventilated basements are ideal sites for mold growth.  The next most common sites of mold growth are the bathroom and the kitchen.  Outdoor [employer] Use air conditioning and keep windows closed Avoid exposure to decaying vegetation. Avoid leaf  raking. Avoid grain handling. Consider wearing a face mask if working in moldy areas.  Indoor Mold Control Maintain humidity below 50%. Clean washable surfaces with 5% bleach solution. Remove sources e.g. Contaminated carpets.  Control of Dog or Cat Allergen Avoidance is the best way to manage a dog or cat allergy. If you have a dog or cat and are allergic to dog or cats, consider removing the dog or cat from the home. If you have a dog or cat but don't want to find it a new home, or if your family wants a pet even though someone in the household is allergic, here are some strategies that may help keep symptoms at bay:  Keep the pet out of your bedroom and restrict it to only a few rooms. Be advised that keeping the dog or cat in only one room will not limit the allergens to that room. Don't pet, hug or kiss the dog or cat; if you do, wash your hands with soap and water. High-efficiency particulate air (HEPA) cleaners run continuously in a bedroom or living room can reduce allergen levels over time. Regular use of a high-efficiency vacuum cleaner or a central vacuum can reduce allergen levels. Giving your dog or cat a bath at least once a week can reduce airborne allergen.

## 2023-08-08 NOTE — Patient Instructions (Addendum)
Plan A = Automatic = Always=   Symbicort 80 Take 2 puffs first thing in am and then another 2 puffs about 12 hours later.    Work on inhaler technique:  relax and gently blow all the way out then take a nice smooth full deep breath back in, triggering the inhaler at same time you start breathing in.  Hold breath in for at least  5 seconds if you can. Blow out Symbicort 80  thru nose. Rinse and gargle with water when done.  If mouth or throat bother you at all,  try brushing teeth/gums/tongue with arm and hammer toothpaste/ make a slurry and gargle and spit out.      Plan B = Backup (to supplement plan A, not to replace it) Only use your albuterol inhaler as a rescue medication to be used if you can't catch your breath by resting or doing a relaxed purse lip breathing pattern.  - The less you use it, the better it will work when you need it. - Ok to use the inhaler up to 2 puffs  every 4 hours if you must but call for appointment if use goes up over your usual need - Don't leave home without it !!  (think of it like the spare tire for your car)      Also  Ok to try albuterol 15 min before an activity (on alternating days)  that you know would usually make you short of breath and see if it makes any difference and if makes none then don't take albuterol after activity unless you can't catch your breath as this means it's the resting that helps, not the albuterol.   Please schedule a follow up visit in 3 months but call sooner if needed - bring inhalers

## 2023-08-09 ENCOUNTER — Ambulatory Visit (INDEPENDENT_AMBULATORY_CARE_PROVIDER_SITE_OTHER): Payer: Medicare Other | Admitting: Family Medicine

## 2023-08-09 ENCOUNTER — Encounter: Payer: Self-pay | Admitting: Family Medicine

## 2023-08-09 VITALS — BP 136/70 | HR 107 | Temp 97.6°F | Resp 14 | Wt 153.2 lb

## 2023-08-09 DIAGNOSIS — J302 Other seasonal allergic rhinitis: Secondary | ICD-10-CM

## 2023-08-09 DIAGNOSIS — J45991 Cough variant asthma: Secondary | ICD-10-CM | POA: Insufficient documentation

## 2023-08-09 DIAGNOSIS — J3089 Other allergic rhinitis: Secondary | ICD-10-CM

## 2023-08-09 DIAGNOSIS — K219 Gastro-esophageal reflux disease without esophagitis: Secondary | ICD-10-CM | POA: Diagnosis not present

## 2023-08-09 DIAGNOSIS — J4489 Other specified chronic obstructive pulmonary disease: Secondary | ICD-10-CM | POA: Diagnosis not present

## 2023-08-09 MED ORDER — CROMOLYN SODIUM 4 % OP SOLN: 2.0000 [drp] | Freq: Four times a day (QID) | OPHTHALMIC | 5 refills | Status: AC

## 2023-08-09 NOTE — Assessment & Plan Note (Addendum)
Onset in her 24s with assoc rhinitis/ dog allergy (still has one) - 08/08/2023  After extensive coaching inhaler device,  effectiveness =    75% from a baseline of < 25% >>> try symbicort 80 2 bid   DDX of  difficult airways management almost all start with A and  include Adherence, Ace Inhibitors, Acid Reflux, Active Sinus Disease, Alpha 1 Antitripsin deficiency, Anxiety masquerading as Airways dz,  ABPA,  Allergy(esp in young), Aspiration (esp in elderly), Adverse effects of meds,  Active smoking or vaping, A bunch of PE's (a small clot burden can't cause this syndrome unless there is already severe underlying pulm or vascular dz with poor reserve) plus two Bs  = Bronchiectasis and Beta blocker use..and one C= CHF   Most likely dx bolded  Adherence is always the initial "prime suspect" and is a multilayered concern that requires a "trust but verify" approach in every patient - starting with knowing how to use medications, especially inhalers, correctly, keeping up with refills and understanding the fundamental difference between maintenance and prns vs those medications only taken for a very short course and then stopped and not refilled.  - see hfa teaching  - return with all meds in hand using a trust but verify approach to confirm accurate Medication  Reconciliation The principal here is that until we are certain that the  patients are doing what we've asked, it makes no sense to ask them to do more.   ? Acid (or non-acid) GERD > always difficult to exclude as up to 75% of pts in some series report no assoc GI/ Heartburn symptoms> rec continue max (24h)  acid suppression and diet restrictions/ reviewed     ? Adverse effects of meds > stop DPI  ?allergy > minimize dog in Bedroom/ try low dose ICS in form of symbicort 80 2bid  Re doe:  Re SABA :  I spent extra time with pt today reviewing appropriate use of albuterol for prn use on exertion with the following points: 1) saba is for relief of sob  that does not improve by walking a slower pace or resting but rather if the pt does not improve after trying this first. 2) If the pt is convinced, as many are, that saba helps recover from activity faster then it's easy to tell if this is the case by re-challenging : ie stop, take the inhaler, then p 5 minutes try the exact same activity (intensity of workload) that just caused the symptoms and see if they are substantially diminished or not after saba 3) if there is an activity that reproducibly causes the symptoms, try the saba 15 min before the activity on alternate days   If in fact the saba really does help, then fine to continue to use it prn but advised may need to look closer at the maintenance regimen being used to achieve better control of airways disease with exertion.    F/u  65m sooner prn          Each maintenance medication was reviewed in detail including emphasizing most importantly the difference between maintenance and prns and under what circumstances the prns are to be triggered using an action plan format where appropriate.  Total time for H and P, chart review, counseling, reviewing hfa device(s) and generating customized AVS unique to this office visit / same day charting  > 40 min with pt new to me        -

## 2023-08-10 ENCOUNTER — Encounter: Payer: Self-pay | Admitting: Family Medicine

## 2023-08-16 ENCOUNTER — Other Ambulatory Visit: Payer: Self-pay | Admitting: Internal Medicine

## 2023-08-16 ENCOUNTER — Ambulatory Visit (HOSPITAL_COMMUNITY): Payer: Medicare Other | Admitting: Psychiatry

## 2023-08-24 ENCOUNTER — Ambulatory Visit (HOSPITAL_COMMUNITY): Payer: Medicare Other | Admitting: Psychiatry

## 2023-08-25 ENCOUNTER — Telehealth: Payer: Self-pay

## 2023-08-25 ENCOUNTER — Other Ambulatory Visit: Payer: Self-pay | Admitting: Gastroenterology

## 2023-08-25 DIAGNOSIS — K580 Irritable bowel syndrome with diarrhea: Secondary | ICD-10-CM

## 2023-08-25 MED ORDER — DICYCLOMINE HCL 10 MG PO CAPS: 10.0000 mg | ORAL_CAPSULE | Freq: Three times a day (TID) | ORAL | 3 refills | Status: AC

## 2023-08-25 NOTE — Telephone Encounter (Signed)
Rx sent 

## 2023-08-25 NOTE — Telephone Encounter (Signed)
Refill request was received via fax from [place] for Dicyclomine 10 mg Capsule Qty: 360. Pt was last seen on 05/26/2023.

## 2023-08-26 ENCOUNTER — Other Ambulatory Visit: Payer: Self-pay | Admitting: Gastroenterology

## 2023-08-26 DIAGNOSIS — R197 Diarrhea, unspecified: Secondary | ICD-10-CM

## 2023-08-31 NOTE — Telephone Encounter (Signed)
Rx printed. Will need to be faxed to pharmacy.

## 2023-09-11 ENCOUNTER — Encounter (HOSPITAL_COMMUNITY): Payer: Self-pay | Admitting: Psychiatry

## 2023-09-11 ENCOUNTER — Telehealth (HOSPITAL_COMMUNITY): Payer: Medicare Other | Admitting: Psychiatry

## 2023-09-11 DIAGNOSIS — F332 Major depressive disorder, recurrent severe without psychotic features: Secondary | ICD-10-CM

## 2023-09-11 DIAGNOSIS — F411 Generalized anxiety disorder: Secondary | ICD-10-CM | POA: Diagnosis not present

## 2023-09-11 MED ORDER — VENLAFAXINE HCL ER 150 MG PO CP24: ORAL_CAPSULE | ORAL | 1 refills | Status: DC

## 2023-09-11 MED ORDER — LAMOTRIGINE 100 MG PO TABS: 100.0000 mg | ORAL_TABLET | Freq: Two times a day (BID) | ORAL | 2 refills | Status: DC

## 2023-09-11 MED ORDER — LORAZEPAM 0.5 MG PO TABS: 0.2500 mg | ORAL_TABLET | Freq: Every day | ORAL | 2 refills | Status: DC | PRN

## 2023-09-11 NOTE — Progress Notes (Signed)
 Virtual Visit via Video Note  I connected with Lauren [name] on 09/11/23 at  1:20 PM EST by a video enabled telemedicine application and verified that I am speaking with the correct person using two identifiers.  Location: Patient: home Provider: office   I discussed the limitations of evaluation and management by telemedicine and the availability of in person appointments. The patient expressed understanding and agreed to proceed.     I discussed the assessment and treatment plan with the patient. The patient was provided an opportunity to ask questions and all were answered. The patient agreed with the plan and demonstrated an understanding of the instructions.   The patient was advised to call back or seek an in-person evaluation if the symptoms worsen or if the condition fails to improve as anticipated.  I provided 20 minutes of non-face-to-face time during this encounter.   Barnie Gull, MD  [facility] MD/PA/NP OP Progress Note  09/11/2023 1:39 PM Lauren [name]  MRN:  [id number]  Chief Complaint:  Chief Complaint  Patient presents with   Depression   Anxiety   Follow-up   HPI: This patient is a 73 year old widowed white female who lives alone in [place]. She has 1 stepchild. Another son killed himself several years ago. The patient is on disability for fibromyalgia.   The patient returns for follow-up after 3 months regarding her depression anxiety and insomnia.  She states overall she has been doing well.  She contracted COVID back in the fall but has gotten over it.  She states that she is trying to stay away from groups of people.  Nevertheless she is staying busy in her house cleaning things out and reading.  Her mood has been good and she denies significant depression anxiety or manic symptoms such as irritability or racing thoughts.  She is sleeping well. Visit Diagnosis:    ICD-10-CM   1. Major depressive disorder, recurrent, severe without psychotic features (HCC)   F33.2     2. Generalized anxiety disorder  F41.1       Past Psychiatric History: Long history of depression and mood instability.  She was hospitalized about 8 years ago for suicidal ideation  Past Medical History:  Past Medical History:  Diagnosis Date   Anxiety    Chronic pain    COPD (chronic obstructive pulmonary disease) (HCC)    Depression    Diabetes mellitus without complication (HCC)    Fibromyalgia    Fibromyalgia    GERD (gastroesophageal reflux disease)    Headache    Hepatitis C    History of blood transfusion    History of bronchitis    IBS (irritable bowel syndrome)    Osteoarthritis    Pneumonia    Prediabetes    Shortness of breath dyspnea    allergies; increased pain;    Vertigo    Wears glasses     Past Surgical History:  Procedure Laterality Date   BIOPSY  09/16/2020   Procedure: BIOPSY;  Surgeon: Eartha Angelia Sieving, MD;  [facility];  Service: Gastroenterology;;   BIOPSY  07/11/2022   Procedure: BIOPSY;  Surgeon: Cindie Carlin POUR, DO;  [facility];  Service: Endoscopy;;   CATARACT EXTRACTION W/PHACO Right 08/01/2017   Procedure: CATARACT EXTRACTION PHACO AND INTRAOCULAR LENS PLACEMENT RIGHT EYE;  Surgeon: Roz Anes, MD;  Location: [facility];  Service: Ophthalmology;  Laterality: Right;  CDE: 3.30   CATARACT EXTRACTION W/PHACO Left 08/15/2017   Procedure: CATARACT EXTRACTION PHACO AND  INTRAOCULAR LENS PLACEMENT (IOC);  Surgeon: Roz Anes, MD;  Location: [facility];  Service: Ophthalmology;  Laterality: Left;  CDE: 4.00   CHOLECYSTECTOMY     COLONOSCOPY     COLONOSCOPY N/A 10/23/2014   Procedure: COLONOSCOPY;  Surgeon: Claudis RAYMOND Rivet, MD;  [facility];  Service: Endoscopy;  Laterality: N/A;  1030   COLONOSCOPY WITH PROPOFOL  N/A 07/11/2022   Procedure: COLONOSCOPY WITH PROPOFOL ;  Surgeon: Cindie Carlin POUR, DO;  [facility];  Service: Endoscopy;  Laterality: N/A;  2:30pm, asa 3    ESOPHAGOGASTRODUODENOSCOPY N/A 10/23/2014   Procedure: ESOPHAGOGASTRODUODENOSCOPY (EGD);  Surgeon: Claudis RAYMOND Rivet, MD;  [facility];  Service: Endoscopy;  Laterality: N/A;   ESOPHAGOGASTRODUODENOSCOPY (EGD) WITH PROPOFOL  N/A 09/16/2020   Procedure: ESOPHAGOGASTRODUODENOSCOPY (EGD) WITH PROPOFOL ;  Surgeon: Eartha Angelia Sieving, MD;  [facility];  Service: Gastroenterology;  Laterality: N/A;  7:30   KNEE ARTHROSCOPY Left 10/21/2015   Procedure: ARTHROSCOPY LEFT KNEE WITH MENICAL DEBRIDEMENT, Chondroplasty;  Surgeon: Dempsey Moan, MD;  Location: [facility];  Service: Orthopedics;  Laterality: Left;   MANDIBLE SURGERY  09/06/1979   ORIF WRIST FRACTURE Right 07/23/2013   Procedure: OPEN REDUCTION INTERNAL FIXATION (ORIF) WRIST FRACTURE;  Surgeon: Elsie Mussel, MD;  Location: [facility];  Service: Orthopedics;  Laterality: Right;   SAVORY DILATION  09/16/2020   Procedure: SAVORY DILATION;  Surgeon: Eartha Angelia, Sieving, MD;  [facility];  Service: Gastroenterology;;   TONSILLECTOMY     TOTAL KNEE ARTHROPLASTY     2011 rt knee   TOTAL KNEE ARTHROPLASTY Left 04/16/2018   Procedure: LEFT TOTAL KNEE ARTHROPLASTY;  Surgeon: Moan Dempsey, MD;  Location: [facility];  Service: Orthopedics;  Laterality: Left;   UPPER GASTROINTESTINAL ENDOSCOPY      Family Psychiatric History: See below  Family History:  Family History  Problem Relation Age of Onset   Alcohol abuse Father    Diabetes Father    Stroke Father    Depression Father    Hypertension Father    Heart attack Father    High Cholesterol Father    Alcohol abuse Maternal Grandfather    Alcohol abuse Maternal Grandmother    Hypertension Maternal Grandmother    Alcohol abuse Paternal Grandfather    Alcohol abuse Paternal Grandmother    Diabetes Paternal Grandmother    Stroke Mother    Irritable bowel syndrome Mother    Sexual abuse Mother    Hypertension Mother    Breast cancer Mother    Diabetes Brother     Healthy Brother    Diabetes Brother    Heart disease Brother    Sarcoidosis Brother    Heart failure Brother    Anxiety disorder Paternal Uncle    Alcohol abuse Paternal Uncle    Alcohol abuse Cousin    Anxiety disorder Maternal Uncle    ADD / ADHD Neg Hx    Bipolar disorder Neg Hx    Dementia Neg Hx    Drug abuse Neg Hx    Paranoid behavior Neg Hx    Schizophrenia Neg Hx    Seizures Neg Hx    Physical abuse Neg Hx     Social History:  Social History   Socioeconomic History   Marital status: Widowed    Spouse name: Not on file   Number of children: Not on file   Years of education: Not on file   Highest education level: Not on file  Occupational History   Not on  file  Tobacco Use   Smoking status: Former    Current packs/day: 0.00    Average packs/day: 2.0 packs/day for 15.0 years (30.0 ttl pk-yrs)    Types: Cigarettes    Start date: 09/06/1967    Quit date: 09/05/1982    Years since quitting: 41.0    Passive exposure: Never   Smokeless tobacco: Never  Vaping Use   Vaping status: Never Used  Substance and Sexual Activity   Alcohol use: No    Alcohol/week: 0.0 standard drinks of alcohol    Comment: history of alcoholism; pt states has been sober 28 years   Drug use: No   Sexual activity: Never  Other Topics Concern   Not on file  Social History Narrative   Not on file   Social Drivers of Health   Financial Resource Strain: Not on file  Food Insecurity: Not on file  Transportation Needs: Not on file  Physical Activity: Not on file  Stress: Not on file  Social Connections: Not on file    Allergies:  Allergies  Allergen Reactions   Elavil  [Amitriptyline ] Other (See Comments)    Vivid dreams and almost suicidal   Flagyl  [Metronidazole ] Itching   Vistaril  [Hydroxyzine  Hcl] Other (See Comments)    Got higher than a kite on too much of this.   Geodon [Ziprasidone Hydrochloride] Other (See Comments)    Blacked out   Lactose Intolerance (Gi) Other (See  Comments)    GI upset   Latex Other (See Comments)    Red at site   Other Other (See Comments)    Metabolic Disorder Labs: Lab Results  Component Value Date   HGBA1C 6.1 (H) 10/13/2015   MPG 128 10/13/2015   No results found for: PROLACTIN No results found for: CHOL, TRIG, HDL, CHOLHDL, VLDL, LDLCALC Lab Results  Component Value Date   TSH 2.400 03/19/2014    Therapeutic Level Labs: No results found for: LITHIUM No results found for: VALPROATE No results found for: CBMZ  Current Medications: Current Outpatient Medications  Medication Sig Dispense Refill   acetaminophen  (TYLENOL ) 500 MG tablet Take 500 mg by mouth every 6 (six) hours as needed for moderate pain.     albuterol  (VENTOLIN  HFA) 108 (90 Base) MCG/ACT inhaler INHALE TWO PUFFS BY MOUTH INTO LUNGS EVERY 4 HOURS AS NEEDED FOR SHORTNESS OF BREATH OR wheezing 8.5 g 3   azelastine  (ASTELIN ) 0.1 % nasal spray Place 2 sprays into both nostrils 2 (two) times daily as needed (runny nose or sneezing). 30 mL 5   Azelastine  HCl 0.15 % SOLN SPRAY 2 SPRAYS INTO EACH NOSTRIL EVERY DAY 30 mL 5   budesonide -formoterol  (SYMBICORT ) 80-4.5 MCG/ACT inhaler Take 2 puffs first thing in am and then another 2 puffs about 12 hours later. 1 each 12   CALCIUM  MAGNESIUM  ZINC PO Take 1 tablet by mouth daily.     Cholecalciferol (VITAMIN D -3) 25 MCG (1000 UT) CAPS Take 1,000 Units by mouth daily.     clobetasol (TEMOVATE) 0.05 % external solution Apply 1 Application topically 2 (two) times daily as needed.     cromolyn  (OPTICROM ) 4 % ophthalmic solution Place 2 drops into both eyes 4 (four) times daily. 10 mL 5   [employer] Omega-3 Krill Oil 350 MG CAPS Take 350 mg by mouth daily.     Cyanocobalamin  (B-12 PO) Place 1 drop under the tongue daily.     dexlansoprazole  (DEXILANT ) 60 MG capsule Take 1 capsule (60 mg total) by mouth daily. 30  capsule 11   diclofenac  sodium (VOLTAREN ) 1 % GEL Apply 2 g topically 4 (four) times daily as needed  (pain).     dicyclomine  (BENTYL ) 10 MG capsule Take 1 capsule (10 mg total) by mouth 4 (four) times daily -  before meals and at bedtime. 360 capsule 3   diphenoxylate -atropine  (LOMOTIL ) 2.5-0.025 MG tablet TAKE (1) TABLET BY MOUTH (3) TIMES DAILY AS NEEDED. 60 tablet 1   fluticasone  (FLONASE ) 50 MCG/ACT nasal spray Place 2 sprays into both nostrils daily as needed (for a stuffy nose). 16 g 5   folic acid  (FOLVITE ) 1 MG tablet Take 1 mg by mouth daily.     gabapentin  (NEURONTIN ) 100 MG capsule Take 200-300 mg by mouth See admin instructions. Take 200mg  by mouth every day in the morning and take 300mg  by mouth at bedtime     Glucosamine-Chondroitin (GLUCOSAMINE CHONDR COMPLEX PO) Take 6 tablets by mouth daily. Glucosamine 1500mg  and Chondroitin sulfate 1200mg      lamoTRIgine  (LAMICTAL ) 100 MG tablet Take 1 tablet (100 mg total) by mouth 2 (two) times daily. 180 tablet 2   LORazepam  (ATIVAN ) 0.5 MG tablet Take 0.5 tablets (0.25 mg total) by mouth daily as needed for anxiety. 15 tablet 2   losartan  (COZAAR ) 50 MG tablet Take by mouth daily. Taking 1.5 Tablets Daily     methotrexate (RHEUMATREX) 2.5 MG tablet Take 15 mg by mouth once a week.     METHOTREXATE PO Take 6 tablets by mouth once a week.     metroNIDAZOLE  (METROGEL ) 1 % gel Apply 1 application  topically at bedtime.     montelukast  (SINGULAIR ) 10 MG tablet Take 1 tablet (10 mg total) by mouth at bedtime. 30 tablet 0   Multiple Vitamins-Minerals (HAIR SKIN AND NAILS FORMULA) TABS Take 2 tablets by mouth daily.     omega-3 acid ethyl esters (LOVAZA) 1 g capsule Take 1 capsule by mouth 2 (two) times daily.     ONETOUCH VERIO test strip 1 each by Other route as needed.     rosuvastatin (CRESTOR) 20 MG tablet Take 20 mg by mouth daily.     venlafaxine  XR (EFFEXOR -XR) 150 MG 24 hr capsule TAKE 2 CAPSULES BY MOUTH EVERY DAY 180 capsule 1   vitamin E 400 UNIT capsule Take 400 Units daily by mouth.     No current facility-administered medications  for this visit.     Musculoskeletal: Strength & Muscle Tone: within normal limits Gait & Station: normal Patient leans: N/A  Psychiatric Specialty Exam: Review of Systems  Musculoskeletal:  Positive for arthralgias.  All other systems reviewed and are negative.   There were no vitals taken for this visit.There is no height or weight on file to calculate BMI.  General Appearance: Casual and Fairly Groomed  Eye Contact:  Good  Speech:  Clear and Coherent  Volume:  Normal  Mood:  Euthymic  Affect:  Congruent  Thought Process:  Goal Directed  Orientation:  Full (Time, Place, and Person)  Thought Content: WDL   Suicidal Thoughts:  No  Homicidal Thoughts:  No  Memory:  Immediate;   Good Recent;   Good Remote;   Good  Judgement:  Good  Insight:  Good  Psychomotor Activity:  Normal  Concentration:  Concentration: Good and Attention Span: Good  Recall:  Good  Fund of Knowledge: Good  Language: Good  Akathisia:  No  Handed:  Right  AIMS (if indicated): not done  Assets:  Communication Skills Desire for Improvement  Resilience Social Support Talents/Skills  ADL's:  Intact  Cognition: WNL  Sleep:  Good   Screenings: AUDIT    Flowsheet Row [facility]  Alcohol Use Disorder Identification Test Final Score (AUDIT) 0      GAD-7    Flowsheet Row Office Visit from 05/09/2023 in [facility] [place] Counselor from 02/23/2023 in [facility] [place] Office Visit from 02/09/2023 in [facility] [place] Counselor from 10/21/2020 in [facility] [place] Counselor from 06/20/2018 in [facility] [place]  Total GAD-7 Score 11 11 16 10 16       PHQ2-9    Flowsheet Row Counselor from 07/10/2023 in [facility]  [place] Counselor from 06/26/2023 in [facility] [place] Office Visit from 05/09/2023 in [facility] [place] Counselor from 02/23/2023 in [facility] [place] Office Visit from 02/09/2023 in [facility] Total Score 6 4 5 3 4   PHQ-9 Total Score 22 18 20 13 19       Flowsheet Row Counselor from 02/23/2023 in [facility] [place] Admission (Discharged) from 07/11/2022 in [facility] Pre-Admission Testing 60 from 07/06/2022 in [facility]  C-SSRS RISK CATEGORY Low Risk No Risk No Risk        Assessment and Plan: This patient is a 73 year old female with a history depression anxiety and possible bipolar disorder.  She is doing well on her current regimen.  She will continue Lamictal  100 mg twice daily for mood stabilization and Effexor  XR 300 mg daily for depression.  She no longer uses hydroxyzine  because of excessive drowsiness.  She will continue lorazepam  0.25 mg as needed for anxiety or sleep.  She will return to see me in 3 months  Collaboration of Care: Collaboration of Care: Referral or follow-up with counselor/therapist AEB patient will continue therapy with Winton Rubinstein in our office  Patient/Guardian was advised Release of Information must be obtained prior to any record release in order to collaborate their care with an outside provider. Patient/Guardian was advised if they have not already done so to contact the registration department to sign all necessary forms in order for us  to release information regarding their care.   Consent: Patient/Guardian gives verbal consent for treatment and assignment of benefits for services provided during this visit. Patient/Guardian expressed understanding and agreed to proceed.    Barnie Gull, MD 09/11/2023, 1:39 PM

## 2023-09-12 ENCOUNTER — Ambulatory Visit (HOSPITAL_COMMUNITY): Payer: Medicare Other | Admitting: Psychiatry

## 2023-09-12 ENCOUNTER — Encounter (HOSPITAL_COMMUNITY): Payer: Self-pay

## 2023-09-16 ENCOUNTER — Other Ambulatory Visit: Payer: Self-pay | Admitting: Internal Medicine

## 2023-09-19 DIAGNOSIS — M810 Age-related osteoporosis without current pathological fracture: Secondary | ICD-10-CM | POA: Diagnosis not present

## 2023-09-19 DIAGNOSIS — M1990 Unspecified osteoarthritis, unspecified site: Secondary | ICD-10-CM | POA: Diagnosis not present

## 2023-09-19 DIAGNOSIS — E119 Type 2 diabetes mellitus without complications: Secondary | ICD-10-CM | POA: Diagnosis not present

## 2023-09-19 DIAGNOSIS — Z79899 Other long term (current) drug therapy: Secondary | ICD-10-CM | POA: Diagnosis not present

## 2023-09-19 DIAGNOSIS — M1380 Other specified arthritis, unspecified site: Secondary | ICD-10-CM | POA: Diagnosis not present

## 2023-09-26 ENCOUNTER — Ambulatory Visit (INDEPENDENT_AMBULATORY_CARE_PROVIDER_SITE_OTHER): Payer: Medicare Other | Admitting: Psychiatry

## 2023-09-26 DIAGNOSIS — F332 Major depressive disorder, recurrent severe without psychotic features: Secondary | ICD-10-CM | POA: Diagnosis not present
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
## 2023-10-10 ENCOUNTER — Ambulatory Visit (INDEPENDENT_AMBULATORY_CARE_PROVIDER_SITE_OTHER): Payer: Medicare Other | Admitting: Psychiatry

## 2023-10-10 DIAGNOSIS — F332 Major depressive disorder, recurrent severe without psychotic features: Secondary | ICD-10-CM

## 2023-10-10 DIAGNOSIS — F411 Generalized anxiety disorder: Secondary | ICD-10-CM | POA: Diagnosis not present
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
## 2023-10-31 ENCOUNTER — Encounter (HOSPITAL_COMMUNITY): Payer: Self-pay

## 2023-10-31 ENCOUNTER — Ambulatory Visit (HOSPITAL_COMMUNITY)
Admission: RE | Admit: 2023-10-31 | Discharge: 2023-10-31 | Disposition: A | Discharge status: Home / Self Care | Payer: Medicare Other | Source: Ambulatory Visit | Attending: Family Medicine | Admitting: Family Medicine

## 2023-10-31 ENCOUNTER — Ambulatory Visit (HOSPITAL_COMMUNITY)
Admission: RE | Admit: 2023-10-31 | Discharge: 2023-10-31 | Disposition: A | Discharge status: Home / Self Care | Payer: Medicare Other | Source: Ambulatory Visit | Attending: Family Medicine

## 2023-10-31 DIAGNOSIS — N644 Mastodynia: Secondary | ICD-10-CM | POA: Insufficient documentation

## 2023-10-31 DIAGNOSIS — R92323 Mammographic fibroglandular density, bilateral breasts: Secondary | ICD-10-CM | POA: Diagnosis not present

## 2023-10-31 DIAGNOSIS — N6320 Unspecified lump in the left breast, unspecified quadrant: Secondary | ICD-10-CM | POA: Insufficient documentation

## 2023-10-31 DIAGNOSIS — N63 Unspecified lump in unspecified breast: Secondary | ICD-10-CM | POA: Diagnosis present

## 2023-11-01 DIAGNOSIS — E1169 Type 2 diabetes mellitus with other specified complication: Secondary | ICD-10-CM | POA: Diagnosis not present

## 2023-11-01 DIAGNOSIS — E7800 Pure hypercholesterolemia, unspecified: Secondary | ICD-10-CM | POA: Diagnosis not present

## 2023-11-01 DIAGNOSIS — M797 Fibromyalgia: Secondary | ICD-10-CM | POA: Diagnosis not present

## 2023-11-01 DIAGNOSIS — I10 Essential (primary) hypertension: Secondary | ICD-10-CM | POA: Diagnosis not present
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
## 2023-11-06 ENCOUNTER — Ambulatory Visit: Payer: Medicare Other | Admitting: Internal Medicine

## 2023-11-06 ENCOUNTER — Telehealth: Payer: Self-pay | Admitting: Internal Medicine

## 2023-11-06 DIAGNOSIS — D225 Melanocytic nevi of trunk: Secondary | ICD-10-CM | POA: Diagnosis not present

## 2023-11-06 DIAGNOSIS — Z1283 Encounter for screening for malignant neoplasm of skin: Secondary | ICD-10-CM | POA: Diagnosis not present
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
## 2023-11-08 ENCOUNTER — Other Ambulatory Visit: Payer: Self-pay | Admitting: Internal Medicine

## 2023-11-08 ENCOUNTER — Encounter: Payer: Self-pay | Admitting: Internal Medicine

## 2023-11-08 MED ORDER — BUDESONIDE-FORMOTEROL FUMARATE 80-4.5 MCG/ACT IN AERO: INHALATION_SPRAY | RESPIRATORY_TRACT | 12 refills | Status: AC

## 2023-11-08 NOTE — Telephone Encounter (Signed)
 Let pt know I sent it

## 2023-11-09 ENCOUNTER — Telehealth: Payer: Self-pay

## 2023-11-09 ENCOUNTER — Other Ambulatory Visit (HOSPITAL_COMMUNITY): Payer: Self-pay

## 2023-11-09 NOTE — Telephone Encounter (Signed)
 If can't afford any of the above just use saba prn until ov and we'll regroup then

## 2023-11-09 NOTE — Telephone Encounter (Signed)
 Test results for alternatives in the same class are as follows.  Advair HFA (Brand)  non preferred  (Generic) non preferred Breo Ellipta  $409.51 Wixela Inhub  $82.52 Advair Diskus (Brand) non preferred   (Generic)  $82.52 Dulera  $344.66 Symbicort (Brand)  $235.92  (Generic) non preferred Breyna non preferred

## 2023-11-09 NOTE — Telephone Encounter (Signed)
 Pharm team- can you please help with what the covered alternatives for Wixela are? Thanks!

## 2023-11-09 NOTE — Telephone Encounter (Signed)
 Pharmacy Patient Advocate Encounter  Asked for alternatives in ICS+LABA class.  Test results for alternatives in the same class are as follows.  Advair HFA (Brand)  non preferred  (Generic) non preferred Breo Ellipta  $409.51 Wixela Inhub  $82.52 Advair Diskus (Brand) non preferred   (Generic)  $82.52 Dulera  $344.66 Symbicort (Brand)  $235.92  (Generic) non preferred Breyna non preferred

## 2023-11-10 ENCOUNTER — Other Ambulatory Visit: Payer: Self-pay

## 2023-11-10 MED ORDER — BUDESONIDE-FORMOTEROL FUMARATE 80-4.5 MCG/ACT IN AERO: INHALATION_SPRAY | RESPIRATORY_TRACT | 12 refills | Status: DC

## 2023-11-13 NOTE — Progress Notes (Unsigned)
 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  Work on inhaler technique:   Plan B = Backup (to supplement plan A, not to replace it) Only use your albuterol inhaler as a rescue medication  Also  Ok to try albuterol 15 min before an activity (on alternating days)  that you know would usually make you short of breath Please schedule a follow up visit in 3 months but call sooner if needed - bring inhalers     11/14/2023  f/u ov/[place] office/Lauren [name] re: cough variant asthma  maint on symbicort 80 started on 11/11/23  and dog now out of bedroom>>>  less saba use  Chief Complaint  Patient presents with   Follow-up    Follow up ,  Dyspnea:  [employer] ok /  Cough: more than usual x 2 weeks  Sleeping: flat bed 2pillows s    resp cc  SABA use: not needing now  02: not     No obvious day to day or daytime variability or assoc excess/ purulent sputum or mucus plugs or hemoptysis or cp or chest tightness, subjective wheeze or overt  hb symptoms.    Also denies any obvious fluctuation of symptoms with weather or environmental changes or other aggravating or alleviating factors except as outlined above   No unusual  exposure hx or h/o childhood pna/ asthma or knowledge of premature birth.  Current Allergies, Complete Past Medical History, Past Surgical History, Family History, and Social History were reviewed in [employer] record.  ROS  The following are not active complaints unless bolded Hoarseness, sore throat, dysphagia, dental problems, itching, sneezing,  nasal congestion or discharge of excess mucus or purulent secretions, ear ache,   fever, chills, sweats, unintended wt loss or wt gain, classically pleuritic or exertional cp,  orthopnea pnd or arm/hand swelling  or leg swelling, presyncope, palpitations, abdominal pain, anorexia, nausea, vomiting, diarrhea  or change in bowel habits or change in bladder habits, change in stools or change in urine, dysuria, hematuria,  rash, arthralgias, visual complaints, headache, numbness, weakness or ataxia or problems with walking or coordination,  change in mood or  memory.        Current Meds  Medication Sig   acetaminophen (TYLENOL) 500 MG tablet Take 500 mg by mouth every 6 (six) hours as needed for moderate pain.   albuterol (VENTOLIN HFA) 108 (90 Base) MCG/ACT inhaler INHALE TWO PUFFS BY MOUTH INTO LUNGS EVERY 4 HOURS AS NEEDED FOR SHORTNESS OF BREATH OR wheezing  Azelastine HCl 0.15 % SOLN SPRAY 2 SPRAYS INTO EACH NOSTRIL EVERY DAY   budesonide-formoterol (SYMBICORT) 80-4.5 MCG/ACT inhaler Take 2 puffs first thing in am and then another 2 puffs about 12 hours later.   CALCIUM MAGNESIUM ZINC PO Take 1 tablet by mouth daily.   Cholecalciferol (VITAMIN D-3) 25 MCG (1000 UT) CAPS Take 1,000 Units by mouth daily.   clobetasol (TEMOVATE) 0.05 % external solution Apply 1 Application topically 2 (two) times daily as needed.   cromolyn (OPTICROM) 4 % ophthalmic solution Place 2 drops into both eyes 4 (four) times daily.   [employer] Omega-3 Krill Oil 350 MG CAPS Take 350 mg by mouth daily.   Cyanocobalamin (B-12 PO) Place 1 drop under the tongue  daily.   dexlansoprazole (DEXILANT) 60 MG capsule Take 1 capsule (60 mg total) by mouth daily.   diclofenac sodium (VOLTAREN) 1 % GEL Apply 2 g topically 4 (four) times daily as needed (pain).   dicyclomine (BENTYL) 10 MG capsule Take 1 capsule (10 mg total) by mouth 4 (four) times daily -  before meals and at bedtime.   diphenoxylate-atropine (LOMOTIL) 2.5-0.025 MG tablet TAKE (1) TABLET BY MOUTH (3) TIMES DAILY AS NEEDED.   fluticasone (FLONASE) 50 MCG/ACT nasal spray Place 2 sprays into both nostrils daily as needed (for a stuffy nose).   folic acid (FOLVITE) 1 MG tablet Take 1 mg by mouth daily.   gabapentin (NEURONTIN) 100 MG capsule Take 200-300 mg by mouth See admin instructions. Take 200mg  by mouth every day in the morning and take 300mg  by mouth at bedtime   Glucosamine-Chondroitin (GLUCOSAMINE CHONDR COMPLEX PO) Take 6 tablets by mouth daily. Glucosamine 1500mg  and Chondroitin sulfate 1200mg    lamoTRIgine (LAMICTAL) 100 MG tablet Take 1 tablet (100 mg total) by mouth 2 (two) times daily.   LORazepam (ATIVAN) 0.5 MG tablet Take 0.5 tablets (0.25 mg total) by mouth daily as needed for anxiety.   losartan (COZAAR) 50 MG tablet Take by mouth daily. Taking 1.5 Tablets Daily   methotrexate (RHEUMATREX) 2.5 MG tablet Take 15 mg by mouth once a week.   METHOTREXATE PO Take 6 tablets by mouth once a week.   metroNIDAZOLE (METROGEL) 1 % gel Apply 1 application  topically at bedtime.   montelukast (SINGULAIR) 10 MG tablet Take 1 tablet (10 mg total) by mouth at bedtime.   Multiple Vitamins-Minerals (HAIR SKIN AND NAILS FORMULA) TABS Take 2 tablets by mouth daily.   omega-3 acid ethyl esters (LOVAZA) 1 g capsule Take 1 capsule by mouth 2 (two) times daily.   ONETOUCH VERIO test strip 1 each by Other route as needed.   rosuvastatin (CRESTOR) 20 MG tablet Take 20 mg by mouth daily.   venlafaxine XR (EFFEXOR-XR) 150 MG 24 hr capsule TAKE 2 CAPSULES BY MOUTH EVERY DAY   vitamin E 400 UNIT capsule Take  400 Units daily by mouth.           Past Medical History:  Diagnosis Date   Anxiety    Chronic pain    COPD (chronic obstructive pulmonary disease) (HCC)    Depression    Diabetes mellitus without complication (HCC)    Fibromyalgia    Fibromyalgia    GERD (gastroesophageal reflux disease)    Headache    Hepatitis C    History of blood transfusion    History of bronchitis    IBS (irritable bowel syndrome)    Osteoarthritis    Pneumonia    Prediabetes    Shortness  of breath dyspnea    allergies; increased pain;    Vertigo    Wears glasses       Objective:    Wts  11/14/2023        155   08/09/23 153 lb 4 oz (69.5 kg)  08/08/23 153 lb (69.4 kg)  05/26/23 149 lb 6.4 oz (67.8 kg)      Vital signs reviewed  11/14/2023  - Note at rest 02 sats  91% on RA   General appearance:    amb pleasant wf with dry cough    HEENT : Oropharynx  stl mucoid pnd     Nasal turbinates mild edema    NECK :  without  apparent JVD/ palpable Nodes/TM    LUNGS: no acc muscle use,  Nl contour chest which is clear to A and P bilaterally without cough on insp or exp maneuvers   CV:  RRR  no s3 or murmur or increase in P2, and no edema   ABD:  soft and nontender   MS:  Gait nl   ext warm without deformities Or obvious joint restrictions  calf tenderness, cyanosis or clubbing    SKIN: warm and dry without lesions    NEURO:  alert, approp, nl sensorium with  no motor or cerebellar deficits apparent.             Assessment

## 2023-11-14 ENCOUNTER — Encounter: Payer: Self-pay | Admitting: Internal Medicine

## 2023-11-14 ENCOUNTER — Ambulatory Visit: Admitting: Internal Medicine

## 2023-11-14 VITALS — BP 149/73 | HR 82 | Ht 65.0 in | Wt 155.2 lb

## 2023-11-14 DIAGNOSIS — Z87891 Personal history of nicotine dependence: Secondary | ICD-10-CM

## 2023-11-14 DIAGNOSIS — J45991 Cough variant asthma: Secondary | ICD-10-CM | POA: Diagnosis not present

## 2023-11-14 MED ORDER — METHYLPREDNISOLONE ACETATE 80 MG/ML IJ SUSP
120.0000 mg | Freq: Once | INTRAMUSCULAR | Status: AC
  Administered 2023-11-14: 120 mg via INTRAMUSCULAR

## 2023-11-14 NOTE — Assessment & Plan Note (Addendum)
 Onset in her 25s with assoc rhinitis/ dog allergy (still has one) - 08/08/2023    try symbicort 80  2bid > did not start until 11/11/23 - 11/14/2023  After extensive coaching inhaler device,  effectiveness =    60% from a baseline of 30%   So needs to continue to work on optimal hfa technique and pet dander avoidance and approp use of saba:  Re SABA :  I spent extra time with pt today reviewing appropriate use of albuterol for prn use on exertion with the following points: 1) saba is for relief of sob that does not improve by walking a slower pace or resting but rather if the pt does not improve after trying this first. 2) If the pt is convinced, as many are, that saba helps recover from activity faster then it's easy to tell if this is the case by re-challenging : ie stop, take the inhaler, then p 5 minutes try the exact same activity (intensity of workload) that just caused the symptoms and see if they are substantially diminished or not after saba 3) if there is an activity that reproducibly causes the symptoms, try the saba 15 min before the activity on alternate days   If in fact the saba really does help, then fine to continue to use it prn but advised may need to look closer at the maintenance regimen being used to achieve better control of airways disease with exertion.   F/u 6 m, sooner prn          Each maintenance medication was reviewed in detail including emphasizing most importantly the difference between maintenance and prns and under what circumstances the prns are to be triggered using an action plan format where appropriate.  Total time for H and P, chart review, counseling, reviewing hfa  device(s) and generating customized AVS unique to this office visit / same day charting = 30 min

## 2023-11-14 NOTE — Addendum Note (Signed)
 Addended by: Winn Jock on: 11/14/2023 04:35 PM   Modules accepted: Orders

## 2023-11-14 NOTE — Patient Instructions (Addendum)
 Depomedrol 120 mg IM today   For itching/ sneezing / runny nose > zyrtec 10 mg at bedtime (can take during the day instead if doesn't make you sleepy  .Work on inhaler technique:  relax and gently blow all the way out then take a nice smooth full deep breath back in, triggering the inhaler at same time you start breathing in.  Hold breath in for at least  5 seconds if you can. Blow out symbicort 80  thru nose. Rinse and gargle with water when done.  If mouth or throat bother you at all,  try brushing teeth/gums/tongue with arm and hammer toothpaste/ make a slurry and gargle and spit out.    Please schedule a follow up visit in 6 months but call sooner if needed - bring your inhalers

## 2023-11-16 ENCOUNTER — Telehealth (HOSPITAL_COMMUNITY): Payer: Self-pay | Admitting: Psychiatry

## 2023-11-16 ENCOUNTER — Encounter (HOSPITAL_COMMUNITY): Payer: Self-pay

## 2023-11-16 ENCOUNTER — Ambulatory Visit (HOSPITAL_COMMUNITY): Payer: Medicare Other | Admitting: Psychiatry

## 2023-11-16 NOTE — Telephone Encounter (Signed)
Therapist attempted to contact patient via phone regarding scheduled in office appointment and received voicemail recording.  Therapist left message indicating attempt and requesting patient call office. 

## 2023-11-20 ENCOUNTER — Telehealth: Payer: Self-pay | Admitting: Cardiovascular Disease

## 2023-11-20 NOTE — Telephone Encounter (Signed)
 Pt said she needs to see someone.  Says she had a pain in her hand, felt like it was moving and thought she may have a clot.  She is also having trouble breathing and this scares her (teary eyed when saying this).  She says when she exercises she "gets out of breath".  Looking though hx pt advised to contact PCP or go to ED for evaluation.  Aware that nothing she has described/explained means heart related.  Aware that coronary CT she had end of 2023 not concerning at that time.  Aware that with her hx she should start with her PCP for evaluation or go to ED.  Informed pt that she is overdue for follow up with cardiology.  Aware will send to schedulers to make an appt but that it may be several months before getting her scheduled to see someone. Patient verbalized understanding and agreeable to plan.

## 2023-11-20 NOTE — Telephone Encounter (Signed)
   Pt c/o of Chest Pain: STAT if active CP, including tightness, pressure, jaw pain, radiating pain to shoulder/upper arm/back, CP unrelieved by Nitro. Symptoms reported of SOB, nausea, vomiting, sweating.  1. Are you having CP right now? No     2. Are you experiencing any other symptoms (ex. SOB, nausea, vomiting, sweating)? Sharp left arm pain, SOB with exertion (went to see Pulmonary about this issue, no SOB currently)   3. Is your CP continuous or coming and going? Just once    4. Have you taken Nitroglycerin? No    5. How long have you been experiencing CP? Just once last night     6. If NO CP at time of call then end call with telling Pt to call back or call 911 if Chest pain returns prior to return call from triage team.

## 2023-11-22 ENCOUNTER — Ambulatory Visit: Admitting: Physician Assistant

## 2023-11-22 DIAGNOSIS — E7800 Pure hypercholesterolemia, unspecified: Secondary | ICD-10-CM | POA: Insufficient documentation

## 2023-11-22 DIAGNOSIS — I10 Essential (primary) hypertension: Secondary | ICD-10-CM | POA: Insufficient documentation

## 2023-11-22 DIAGNOSIS — I2510 Atherosclerotic heart disease of native coronary artery without angina pectoris: Secondary | ICD-10-CM | POA: Insufficient documentation

## 2023-11-22 NOTE — Progress Notes (Deleted)
   Cardiology Office Note:    Date:  11/22/2023  ID:  Lauren [name], DOB [birth year], MRN [id number] PCP: Aliene Beams, MD  [facility] Providers Cardiologist:  Charlton Haws, MD { Click to update primary MD,subspecialty MD or APP then REFRESH:1}    {Click to Open Review  :1}   Patient Profile:      Coronary artery disease  Stress MPI 03/16/18: EF 70, normal perfusion, low risk TTE 09/25/19: EF 60-65, mild LVH, Gr 1 DD, no RWMA, mild LAE, RAP 3  CCTA 07/01/22: mild nonobstructive CAD [LAD prox and mid 25-49; LCx < 25; RCA prox < 25]; CAC score 581 (92nd percentile) Chronic Obstructive Pulmonary Disease  Diabetes mellitus  Hep C Hyperlipidemia  Hypertension  Fibromyalgia  Hx of ETOH abuse           Discussed the use of AI scribe software for clinical note transcription with the patient, who gave verbal consent to proceed.  History of Present Illness Lauren [name] is a 73 y.o. female who returns for evaluation of L arm pain and shortness of breath. She last saw Dr. Eden Emms in 11/2020. Last OV was in 06/2022 with Manson Passey, PA-C. She noted symptoms of chest pain and dyspnea on exertion. CCTA was obtained and demonstrated an elevated calcium score and mild nonobstructive CAD. She called in recently with L arm pain and dyspnea on exertion.   ROS-See HPI***    Studies Reviewed:       *** Results    Risk Assessment/Calculations:   {Does this patient have ATRIAL FIBRILLATION?:[id number]} No BP recorded.  {Refresh Note OR Click here to enter BP  :1}***       Physical Exam:   VS:  There were no vitals taken for this visit.   Wt Readings from Last 3 Encounters:  11/14/23 155 lb 3.2 oz (70.4 kg)  08/09/23 153 lb 4 oz (69.5 kg)  08/08/23 153 lb (69.4 kg)    Physical Exam***     Assessment and Plan:   Assessment & Plan Coronary artery disease involving native coronary artery of native heart with angina pectoris (HCC)  Essential hypertension  Pure  hypercholesterolemia  Asthma-COPD overlap syndrome (HCC)  Assessment and Plan Assessment & Plan    {      :1}    {Are you ordering a CV Procedure (e.g. stress test, cath, DCCV, TEE, etc)?   Press F2        :[id number]}  Dispo:  No follow-ups on file.  Signed, Tereso Newcomer, PA-C

## 2023-11-30 ENCOUNTER — Ambulatory Visit (HOSPITAL_COMMUNITY): Payer: Medicare Other | Admitting: Psychiatry

## 2023-12-14 ENCOUNTER — Ambulatory Visit (INDEPENDENT_AMBULATORY_CARE_PROVIDER_SITE_OTHER): Payer: Medicare Other | Admitting: Psychiatry

## 2023-12-14 DIAGNOSIS — F411 Generalized anxiety disorder: Secondary | ICD-10-CM | POA: Diagnosis not present

## 2023-12-14 DIAGNOSIS — F332 Major depressive disorder, recurrent severe without psychotic features: Secondary | ICD-10-CM
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
## 2023-12-15 ENCOUNTER — Encounter: Payer: Self-pay | Admitting: Cardiovascular Disease

## 2023-12-19 ENCOUNTER — Ambulatory Visit: Admitting: Emergency Medicine

## 2023-12-28 ENCOUNTER — Ambulatory Visit (INDEPENDENT_AMBULATORY_CARE_PROVIDER_SITE_OTHER): Payer: Medicare Other | Admitting: Psychiatry

## 2023-12-28 DIAGNOSIS — F332 Major depressive disorder, recurrent severe without psychotic features: Secondary | ICD-10-CM | POA: Diagnosis not present

## 2023-12-28 DIAGNOSIS — F411 Generalized anxiety disorder: Secondary | ICD-10-CM | POA: Diagnosis not present
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
## 2024-01-03 ENCOUNTER — Encounter: Payer: Self-pay | Admitting: Emergency Medicine

## 2024-01-03 ENCOUNTER — Ambulatory Visit: Attending: Emergency Medicine | Admitting: Emergency Medicine

## 2024-01-03 VITALS — BP 152/92 | HR 93 | Ht 64.0 in | Wt 154.0 lb

## 2024-01-03 DIAGNOSIS — E785 Hyperlipidemia, unspecified: Secondary | ICD-10-CM | POA: Diagnosis not present

## 2024-01-03 DIAGNOSIS — I25119 Atherosclerotic heart disease of native coronary artery with unspecified angina pectoris: Secondary | ICD-10-CM

## 2024-01-03 DIAGNOSIS — J4489 Other specified chronic obstructive pulmonary disease: Secondary | ICD-10-CM

## 2024-01-03 DIAGNOSIS — I10 Essential (primary) hypertension: Secondary | ICD-10-CM

## 2024-01-03 DIAGNOSIS — R079 Chest pain, unspecified: Secondary | ICD-10-CM

## 2024-01-03 DIAGNOSIS — E118 Type 2 diabetes mellitus with unspecified complications: Secondary | ICD-10-CM

## 2024-01-03 LAB — CBC
Hematocrit: 36.1 % (ref 34.0–46.6)
Hemoglobin: 12.1 g/dL (ref 11.1–15.9)
MCH: 29.4 pg (ref 26.6–33.0)
MCHC: 33.5 g/dL (ref 31.5–35.7)
MCV: 88 fL (ref 79–97)
Platelets: 239 10*3/uL (ref 150–450)
RBC: 4.11 x10E6/uL (ref 3.77–5.28)
RDW: 12.2 % (ref 11.7–15.4)
WBC: 7.1 10*3/uL (ref 3.4–10.8)

## 2024-01-03 MED ORDER — LOSARTAN POTASSIUM 100 MG PO TABS: 100.0000 mg | ORAL_TABLET | Freq: Every day | ORAL | 2 refills | Status: AC

## 2024-01-03 NOTE — Patient Instructions (Addendum)
 Medication Instructions:  INCREASE YOUR LOSARTAN TO 100 MG DAILY.   Lab Work: Nutritional therapist AND CBC TO BE DONE TODAY.   Testing/Procedures: Your physician has requested that you have a lexiscan  myoview . For further information please visit [email or web address] Please follow instruction sheet, as given.    Your physician has requested that you have an echocardiogram. Echocardiography is a painless test that uses sound waves to create images of your heart. It provides your doctor with information about the size and shape of your heart and how well your heart's chambers and valves are working. This procedure takes approximately one hour. There are no restrictions for this procedure. Please do NOT wear cologne, perfume, aftershave, or lotions (deodorant is allowed). Please arrive 15 minutes prior to your appointment time.  Please note: We ask at that you not bring children with you during ultrasound (echo/ vascular) testing. Due to room size and safety concerns, children are not allowed in the ultrasound rooms during exams. Our front office staff cannot provide observation of children in our lobby area while testing is being conducted. An adult accompanying a patient to their appointment will only be allowed in the ultrasound room at the discretion of the ultrasound technician under special circumstances. We apologize for any inconvenience.  Follow-Up: At [facility], you and your health needs are our priority.  As part of our continuing mission to provide you with exceptional heart care, our providers are all part of one team.  This team includes your primary Cardiologist (physician) and Advanced Practice Providers or APPs (Physician Assistants and Nurse Practitioners) who all work together to provide you with the care you need, when you need it.  Your next appointment:   6 WEEKS POST TESTING  Provider:   MADISON FOUNTAIN, DNP

## 2024-01-03 NOTE — Progress Notes (Signed)
 Cardiology Office Note:    Date:  01/03/2024  ID:  [name], DOB 1950-12-29, MRN [id number] PCP: Dorena Gander, MD  [facility] Providers Cardiologist:  Janelle Mediate, MD       Patient Profile:      Chief Complaint: Acute visit for left arm and jaw pain History of Present Illness:  Lauren [name] is a 73 y.o. female with visit-pertinent history of chronic pain, COPD, anxiety, hypertension, hyperlipidemia, GERD, exertional dyspnea, fibromyalgia rheumatoid arthritis, osteoarthritis, asthma  She had an echocardiogram and Myoview  in 2019 which were both normal.  She is on disability for history of fibromyalgia.  Previous EtOH abuse, sober for 26 years.  Patient was last seen in office on 06/07/2022.  It was noted that she was a very poor historian.  She had noted ongoing but worsening chronic dyspnea on exertion and chest pressure.  Coronary CTA was ordered and completed on 07/01/2022 showing coronary Score of 581 (92nd Percentile), Mild Calcified Plaque in the Proximal Mid LAD 25-29% and minimal CAD less than 25% RCA/LCx.  She recently called into the nurse triage line on 11/20/2023 with complaints of left arm pain and trouble breathing.   Discussed the use of AI scribe software for clinical note transcription with the patient, who gave verbal consent to proceed.  History of Present Illness Lauren SCHAPIRO "Truett Gab" is a 73 year old female with coronary artery disease who presents with left arm and jaw pain.  She experiences left arm and jaw pain, occurring twice in the past two months, each episode lasting about five minutes to 10 minutes and triggered by stress. No chest pain accompanies these episodes, though she has a history of frequent chest pains which she relates to her gastric reflux.  She notes chest pains usually related to food intake.  She notes that Tums usually resolves the pain.  She denied any exertional chest pain.  Her family history includes heart disease,  with her father and brother having died from heart-related issues in their 18s, raising concerns about her genetic predisposition.  She manages diabetes through diet due to intolerance to metformin . Her blood pressure, monitored daily, has been elevated recently, reaching 180 after taking prednisone  for rheumatoid arthritis pain. She is currently on losartan for hypertension.  Her social history includes increased consumption of processed foods following the loss of her husband four years ago. She is aware of the need to reduce sodium intake due to her high blood pressure and varicose veins.  She denies shortness of breath, lower extremity edema, fatigue, palpitations, melena, hematuria, hemoptysis, diaphoresis, weakness, presyncope, syncope, orthopnea, and PND.  Review of systems:  Please see the history of present illness. All other systems are reviewed and otherwise negative.     Home Medications:    Current Meds  Medication Sig   acetaminophen  (TYLENOL ) 500 MG tablet Take 500 mg by mouth every 6 (six) hours as needed for moderate pain.   albuterol  (VENTOLIN  HFA) 108 (90 Base) MCG/ACT inhaler INHALE TWO PUFFS BY MOUTH INTO LUNGS EVERY 4 HOURS AS NEEDED FOR SHORTNESS OF BREATH OR wheezing   budesonide -formoterol  (SYMBICORT ) 80-4.5 MCG/ACT inhaler Take 2 puffs first thing in am and then another 2 puffs about 12 hours later.   CALCIUM  MAGNESIUM  ZINC PO Take 1 tablet by mouth daily.   Cholecalciferol (VITAMIN D -3) 25 MCG (1000 UT) CAPS Take 1,000 Units by mouth daily.   clobetasol (TEMOVATE) 0.05 % external solution Apply 1 Application topically 2 (two) times daily  as needed.   cromolyn  (OPTICROM ) 4 % ophthalmic solution Place 2 drops into both eyes 4 (four) times daily.   [employer] Omega-3 Krill Oil 350 MG CAPS Take 350 mg by mouth daily.   Cyanocobalamin  (B-12 PO) Place 1 drop under the tongue daily.   dexlansoprazole  (DEXILANT ) 60 MG capsule Take 1 capsule (60 mg total) by mouth daily.    diclofenac  sodium (VOLTAREN ) 1 % GEL Apply 2 g topically 4 (four) times daily as needed (pain).   dicyclomine  (BENTYL ) 10 MG capsule Take 1 capsule (10 mg total) by mouth 4 (four) times daily -  before meals and at bedtime.   diphenoxylate -atropine  (LOMOTIL ) 2.5-0.025 MG tablet TAKE (1) TABLET BY MOUTH (3) TIMES DAILY AS NEEDED.   fluticasone  (FLONASE ) 50 MCG/ACT nasal spray Place 2 sprays into both nostrils daily as needed (for a stuffy nose).   folic acid  (FOLVITE ) 1 MG tablet Take 1 mg by mouth daily.   gabapentin  (NEURONTIN ) 100 MG capsule Take 200-300 mg by mouth See admin instructions. Take 200mg  by mouth every day in the morning and take 300mg  by mouth at bedtime   Glucosamine-Chondroitin (GLUCOSAMINE CHONDR COMPLEX PO) Take 6 tablets by mouth daily. Glucosamine 1500mg  and Chondroitin sulfate 1200mg    lamoTRIgine  (LAMICTAL ) 100 MG tablet Take 1 tablet (100 mg total) by mouth 2 (two) times daily.   LORazepam  (ATIVAN ) 0.5 MG tablet Take 0.5 tablets (0.25 mg total) by mouth daily as needed for anxiety.   losartan (COZAAR) 100 MG tablet Take 1 tablet (100 mg total) by mouth daily.   methotrexate (RHEUMATREX) 2.5 MG tablet Take 15 mg by mouth once a week.   METHOTREXATE PO Take 6 tablets by mouth once a week.   metroNIDAZOLE  (METROGEL ) 1 % gel Apply 1 application  topically at bedtime.   montelukast  (SINGULAIR ) 10 MG tablet Take 1 tablet (10 mg total) by mouth at bedtime.   Multiple Vitamins-Minerals (HAIR SKIN AND NAILS FORMULA) TABS Take 2 tablets by mouth daily.   omega-3 acid ethyl esters (LOVAZA) 1 g capsule Take 1 capsule by mouth 2 (two) times daily.   ONETOUCH VERIO test strip 1 each by Other route as needed.   rosuvastatin (CRESTOR) 20 MG tablet Take 20 mg by mouth daily.   venlafaxine  XR (EFFEXOR -XR) 150 MG 24 hr capsule TAKE 2 CAPSULES BY MOUTH EVERY DAY   vitamin E 400 UNIT capsule Take 400 Units daily by mouth.   [DISCONTINUED] losartan (COZAAR) 50 MG tablet Take by mouth daily.  Taking 1.5 Tablets Daily   Studies Reviewed:   EKG Interpretation Date/Time:  Wednesday January 03 2024 13:52:45 EDT Ventricular Rate:  93 PR Interval:  112 QRS Duration:  108 QT Interval:  360 QTC Calculation: 447 R Axis:   72  Text Interpretation: Normal sinus rhythm Normal ECG When compared with ECG of 24-Sep-2019 20:06, PREVIOUS ECG IS PRESENT Confirmed by Palmer Bobo [phone]) on 01/03/2024 2:03:37 PM    Coronary CTA 07/01/2022 IMPRESSION: 1. Coronary calcium  score of 581. This was 92nd percentile for age-, sex, and race-matched controls.   2. Normal coronary origin with right dominance.   3. Mild calcified plaque in the proximal and mid LAD (25-49%).   4. Minimal CAD (<25%) in the RCA/LCX.   RECOMMENDATIONS: 1. Mild non-obstructive CAD (25-49%). Consider non-atherosclerotic causes of chest pain. Consider preventive therapy and risk factor modification.  Echocardiogram 09/25/2019 1. Left ventricular ejection fraction, by visual estimation, is 60 to  65%. The left ventricle has normal function. There is mildly  increased  left ventricular hypertrophy.   2. Left ventricular diastolic parameters are consistent with Grade I  diastolic dysfunction (impaired relaxation).   3. The left ventricle has no regional wall motion abnormalities.   4. Global right ventricle has normal systolic function.The right  ventricular size is normal. No increase in right ventricular wall  thickness.   5. Left atrial size was mildly dilated.   6. Right atrial size was normal.   7. The mitral valve is grossly normal. No evidence of mitral valve  regurgitation.   8. The tricuspid valve is grossly normal.   9. The tricuspid valve is grossly normal. Tricuspid valve regurgitation  is not demonstrated.  10. The aortic valve is tricuspid. Aortic valve regurgitation is not  visualized.  11. The pulmonic valve was grossly normal. Pulmonic valve regurgitation is  not visualized.  12. The inferior  vena cava is normal in size with greater than 50%  respiratory variability, suggesting right atrial pressure of 3 mmHg.   Risk Assessment/Calculations:     HYPERTENSION CONTROL Vitals:   01/03/24 1355 01/03/24 1640  BP: (!) 146/88 (!) 152/92    The patient's blood pressure is elevated above target today.  In order to address the patient's elevated BP: A current anti-hypertensive medication was adjusted today.          Physical Exam:   VS:  BP (!) 152/92 (BP Location: Left Arm, Patient Position: Sitting, Cuff Size: Normal)   Pulse 93   Ht 5\' 4"  (1.626 m)   Wt 154 lb (69.9 kg)   SpO2 92%   BMI 26.43 kg/m    Wt Readings from Last 3 Encounters:  01/03/24 154 lb (69.9 kg)  11/14/23 155 lb 3.2 oz (70.4 kg)  08/09/23 153 lb 4 oz (69.5 kg)    GEN: Well nourished, well developed in no acute distress NECK: No JVD; No carotid bruits CARDIAC: RRR, no murmurs, rubs, gallops RESPIRATORY:  Clear to auscultation without rales, wheezing or rhonchi  ABDOMEN: Soft, non-tender, non-distended EXTREMITIES:  No edema; No acute deformity     Assessment and Plan:  Coronary artery disease Coronary CTA 07/01/2022 with coronary count score of 581 (92nd percentile) with mild nonobstructive CAD 25-49% She has had 2 episodes over the past 2 months of left-sided arm and jaw pain that occurred during times of stress without evidence of chest pain or dyspnea lasting a total duration of 10 minutes.  However, she will get chest pains frequently throughout the month however relates this to her history of GERD as it is normally associated with food and relieved by Tums. Denies exertional chest pains  - EKG today showed NSR without acute ischemic changes - We long discussion over further ischemic evaluation for which the patient has opted to do so - Lexiscan  Myoview  ordered today for further ischemic testing - Echocardiogram ordered to reevaluate LV function and for valvular abnormalities - CBC and BMET  today - Continue rosuvastatin 20 mg daily  Hyperlipidemia LDL 61, HDL 58, TC 143 on 4/69/6295 LDL under excellent control and under goal less than 70 - Consider repeating fasting lipid panel follow-up visit - Continue rosuvastatin 20 mg daily  Hypertension Blood pressure today is 146/88 and repeat 152/92 Notes she takes her blood pressure at home fairly consistently and has noted blood pressure to be normally greater than 130/80 - Plan to increase losartan from 50 mg to 100 mg daily - Maintain home blood pressure log - Work on weight loss and heart healthy  dieting  Asthma / COPD Well-controlled with no recent recurrent exacerbation - Controlled on Symbicort , montelukast  and albuterol   Type 2 diabetes A1c 6.3% on 10/2023 Patient attempting to lower A1c by diet alone - Management per PCP - Recommend DASH diet (high in vegetables, fruits, low-fat dairy products, whole grains, poultry, fish, and nuts and low in sweets, sugar-sweetened beverages, and red meats), salt restriction and increase physical activity.     Informed Consent   Shared Decision Making/Informed Consent The risks [chest pain, shortness of breath, cardiac arrhythmias, dizziness, blood pressure fluctuations, myocardial infarction, stroke/transient ischemic attack, nausea, vomiting, allergic reaction, radiation exposure, metallic taste sensation and life-threatening complications (estimated to be 1 in 10,000)], benefits (risk stratification, diagnosing coronary artery disease, treatment guidance) and alternatives of a nuclear stress test were discussed in detail with Ms. Laver and she agrees to proceed.     Dispo:  Return in about 6 weeks (around 02/14/2024).  Signed, Ava Boatman, NP

## 2024-01-04 LAB — BASIC METABOLIC PANEL WITH GFR
BUN/Creatinine Ratio: 21 (ref 12–28)
BUN: 18 mg/dL (ref 8–27)
CO2: 25 mmol/L (ref 20–29)
Calcium: 9.8 mg/dL (ref 8.7–10.3)
Chloride: 98 mmol/L (ref 96–106)
Creatinine, Ser: 0.84 mg/dL (ref 0.57–1.00)
Glucose: 98 mg/dL (ref 70–99)
Potassium: 5 mmol/L (ref 3.5–5.2)
Sodium: 137 mmol/L (ref 134–144)
eGFR: 73 mL/min/{1.73_m2} (ref >59)

## 2024-01-10 ENCOUNTER — Other Ambulatory Visit: Payer: Self-pay | Admitting: Internal Medicine

## 2024-01-17 ENCOUNTER — Ambulatory Visit (INDEPENDENT_AMBULATORY_CARE_PROVIDER_SITE_OTHER): Admitting: Psychiatry

## 2024-01-17 DIAGNOSIS — F411 Generalized anxiety disorder: Secondary | ICD-10-CM

## 2024-01-17 DIAGNOSIS — F332 Major depressive disorder, recurrent severe without psychotic features: Secondary | ICD-10-CM
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
## 2024-01-18 DIAGNOSIS — R1013 Epigastric pain: Secondary | ICD-10-CM | POA: Diagnosis not present

## 2024-01-18 DIAGNOSIS — E119 Type 2 diabetes mellitus without complications: Secondary | ICD-10-CM | POA: Diagnosis not present

## 2024-01-18 DIAGNOSIS — M1380 Other specified arthritis, unspecified site: Secondary | ICD-10-CM | POA: Diagnosis not present

## 2024-01-18 DIAGNOSIS — M79643 Pain in unspecified hand: Secondary | ICD-10-CM | POA: Diagnosis not present

## 2024-01-18 DIAGNOSIS — Z79899 Other long term (current) drug therapy: Secondary | ICD-10-CM | POA: Diagnosis not present

## 2024-01-18 DIAGNOSIS — M1990 Unspecified osteoarthritis, unspecified site: Secondary | ICD-10-CM | POA: Diagnosis not present

## 2024-01-18 DIAGNOSIS — M810 Age-related osteoporosis without current pathological fracture: Secondary | ICD-10-CM | POA: Diagnosis not present

## 2024-01-19 ENCOUNTER — Ambulatory Visit (HOSPITAL_BASED_OUTPATIENT_CLINIC_OR_DEPARTMENT_OTHER): Payer: Self-pay | Admitting: *Deleted

## 2024-01-25 DIAGNOSIS — S50862A Insect bite (nonvenomous) of left forearm, initial encounter: Secondary | ICD-10-CM | POA: Diagnosis not present

## 2024-01-25 DIAGNOSIS — Z85820 Personal history of malignant melanoma of skin: Secondary | ICD-10-CM | POA: Diagnosis not present

## 2024-01-25 DIAGNOSIS — L718 Other rosacea: Secondary | ICD-10-CM | POA: Diagnosis not present

## 2024-01-25 DIAGNOSIS — L820 Inflamed seborrheic keratosis: Secondary | ICD-10-CM | POA: Diagnosis not present

## 2024-01-25 DIAGNOSIS — Z08 Encounter for follow-up examination after completed treatment for malignant neoplasm: Secondary | ICD-10-CM | POA: Diagnosis not present

## 2024-01-31 ENCOUNTER — Other Ambulatory Visit: Payer: Self-pay | Admitting: Emergency Medicine

## 2024-01-31 DIAGNOSIS — R079 Chest pain, unspecified: Secondary | ICD-10-CM

## 2024-02-01 ENCOUNTER — Telehealth (HOSPITAL_COMMUNITY): Payer: Self-pay | Admitting: *Deleted

## 2024-02-01 NOTE — Telephone Encounter (Signed)
 Pt given instructions for MPI study.

## 2024-02-08 ENCOUNTER — Ambulatory Visit (HOSPITAL_COMMUNITY)
Admission: RE | Admit: 2024-02-08 | Discharge: 2024-02-08 | Disposition: A | Discharge status: Home / Self Care | Source: Ambulatory Visit | Attending: Internal Medicine | Admitting: Internal Medicine

## 2024-02-08 ENCOUNTER — Ambulatory Visit (HOSPITAL_COMMUNITY)
Admission: RE | Admit: 2024-02-08 | Discharge: 2024-02-08 | Disposition: A | Discharge status: Home / Self Care | Source: Ambulatory Visit | Attending: Emergency Medicine | Admitting: Emergency Medicine

## 2024-02-08 DIAGNOSIS — R079 Chest pain, unspecified: Secondary | ICD-10-CM | POA: Diagnosis not present

## 2024-02-08 LAB — MYOCARDIAL PERFUSION IMAGING
Base ST Depression (mm): 0 mm
LV dias vol: 59 mL (ref 46–106)
LV sys vol: 7 mL
Nuc Stress EF: 88 %
Peak HR: 90 {beats}/min
Rest HR: 76 {beats}/min
Rest Nuclear Isotope Dose: 11 mCi
SDS: 0
SRS: 1
SSS: 0
ST Depression (mm): 0 mm
Stress Nuclear Isotope Dose: 32.8 mCi
TID: 0.86

## 2024-02-08 LAB — ECHOCARDIOGRAM COMPLETE
Area-P 1/2: 4.19 cm2
S' Lateral: 2.4 cm

## 2024-02-08 MED ORDER — REGADENOSON 0.4 MG/5ML IV SOLN
0.4000 mg | Freq: Once | INTRAVENOUS | Status: AC
  Administered 2024-02-08: 0.4 mg via INTRAVENOUS

## 2024-02-08 MED ORDER — TECHNETIUM TC 99M TETROFOSMIN IV KIT
11.0000 | PACK | Freq: Once | INTRAVENOUS | Status: AC | PRN
  Administered 2024-02-08: 11 via INTRAVENOUS

## 2024-02-08 MED ORDER — TECHNETIUM TC 99M TETROFOSMIN IV KIT
32.8000 | PACK | Freq: Once | INTRAVENOUS | Status: AC | PRN
  Administered 2024-02-08: 32.8 via INTRAVENOUS

## 2024-02-08 MED ORDER — REGADENOSON 0.4 MG/5ML IV SOLN
INTRAVENOUS | Status: AC
  Filled 2024-02-08: qty 5

## 2024-02-09 ENCOUNTER — Encounter: Payer: Self-pay | Admitting: Allergy & Immunology

## 2024-02-09 ENCOUNTER — Telehealth: Payer: Self-pay | Admitting: Emergency Medicine

## 2024-02-09 ENCOUNTER — Other Ambulatory Visit: Payer: Self-pay

## 2024-02-09 ENCOUNTER — Ambulatory Visit (INDEPENDENT_AMBULATORY_CARE_PROVIDER_SITE_OTHER): Payer: Medicare Other | Admitting: Allergy & Immunology

## 2024-02-09 VITALS — BP 120/70 | HR 94 | Temp 97.9°F | Resp 20

## 2024-02-09 DIAGNOSIS — J302 Other seasonal allergic rhinitis: Secondary | ICD-10-CM

## 2024-02-09 DIAGNOSIS — K219 Gastro-esophageal reflux disease without esophagitis: Secondary | ICD-10-CM | POA: Diagnosis not present

## 2024-02-09 DIAGNOSIS — J3089 Other allergic rhinitis: Secondary | ICD-10-CM

## 2024-02-09 DIAGNOSIS — J4489 Other specified chronic obstructive pulmonary disease: Secondary | ICD-10-CM

## 2024-02-09 NOTE — Telephone Encounter (Signed)
 Pt returning call for echo and stress test results

## 2024-02-09 NOTE — Telephone Encounter (Signed)
 Spoke with pt regarding her results. Pt already received a call going over the echo. Stress test results were reviewed with pt. Pt verbalized understanding. All questions if any were answered.

## 2024-02-09 NOTE — Patient Instructions (Addendum)
 Allergic rhinitis - planning to restat at the next visit  - Continue allergen avoidance measures directed toward indoor molds, outdoor molds, dog, and cat as listed below - Continue azelastine  nasal spray 2 sprays in each nostril up to twice a day as needed for a runny nose or sneezing - Continue Flonase  2 sprays in each nostril once a day as needed for a stuffy nose.  In the right nostril, point the applicator out toward the right ear. In the left nostril, point the applicator out toward the left ear - Consider saline nasal rinses as needed for nasal symptoms. Use this before any medicated nasal sprays for best result  Asthma/COPD overlap - Continue Symbicort  80-2 puffs twice a day with a spacer to prevent cough or wheeze.  Call the clinic if your asthma is not well-controlled with Symbicort  - Continue montelukast  10 mg once a day to prevent cough or wheeze - Continue albuterol  2 puffs once every 4 hours as needed for cough or wheeze - You may use albuterol  2 puffs 15 minutes before activity to decrease cough or wheeze - We are getting some labs to look for serious difficult to control causes of asthma.   Reflux - Continue dietary and lifestyle modifications as listed below - Continue to follow-up with your gastrointestinal specialist as recommended  Allergic conjunctivitis - Begin Cromolyn  eye drops 2 drops in each eye up to 4 times a day.  - Some over the counter eye drops include Pataday one drop in each eye once a day as needed for red, itchy eyes OR Zaditor one drop in each eye twice a day as needed for red itchy eyes. - Avoid eye drops that say red eye relief as they may contain medications that dry out your eyes.   Return in about 2 weeks (around 02/23/2024) for SKIN TESTING (1-55) . You can have the follow up appointment with Dr. Idolina Maker or a Nurse Practicioner (our Nurse Practitioners are excellent and always have Physician oversight!).    Please inform us  of any Emergency  Department visits, hospitalizations, or changes in symptoms. Call us  before going to the ED for breathing or allergy  symptoms since we might be able to fit you in for a sick visit. Feel free to contact us  anytime with any questions, problems, or concerns.  It was a pleasure to meet you today!  Websites that have reliable patient information: 1. American Academy of Asthma, Allergy , and Immunology: www.aaaai.org 2. Food Allergy  Research and Education (FARE): foodallergy.org 3. Mothers of Asthmatics: http://www.asthmacommunitynetwork.org 4. [employer] of Allergy , Asthma, and Immunology: www.acaai.org      "Like" us  on Facebook and Instagram for our latest updates!      A healthy democracy works best when [employer] participate! Make sure you are registered to vote! If you have moved or changed any of your contact information, you will need to get this updated before voting! Scan the QR codes below to learn more!

## 2024-02-09 NOTE — Progress Notes (Signed)
 FOLLOW UP  Date of Service/Encounter:  02/09/24   Assessment:   Chronic rhinitis - with worsening symptoms (planning for repeat testing at the next visit since she is convinced that she did not hold her antihistamines)   Asthma-COPD overlap syndrome   Irritable bowel syndrome    Rosacea   Depression  Plan/Recommendations:   Allergic rhinitis - planning to restat at the next visit  - Continue allergen avoidance measures directed toward indoor molds, outdoor molds, dog, and cat as listed below - Continue azelastine  nasal spray 2 sprays in each nostril up to twice a day as needed for a runny nose or sneezing - Continue Flonase  2 sprays in each nostril once a day as needed for a stuffy nose.  In the right nostril, point the applicator out toward the right ear. In the left nostril, point the applicator out toward the left ear - Consider saline nasal rinses as needed for nasal symptoms. Use this before any medicated nasal sprays for best result  Asthma/COPD overlap - Continue Symbicort  80-2 puffs twice a day with a spacer to prevent cough or wheeze.  Call the clinic if your asthma is not well-controlled with Symbicort  - Continue montelukast  10 mg once a day to prevent cough or wheeze - Continue albuterol  2 puffs once every 4 hours as needed for cough or wheeze - You may use albuterol  2 puffs 15 minutes before activity to decrease cough or wheeze - We are getting some labs to look for serious difficult to control causes of asthma.   Reflux - Continue dietary and lifestyle modifications as listed below - Continue to follow-up with your gastrointestinal specialist as recommended  Allergic conjunctivitis - Begin Cromolyn  eye drops 2 drops in each eye up to 4 times a day.  - Some over the counter eye drops include Pataday one drop in each eye once a day as needed for red, itchy eyes OR Zaditor one drop in each eye twice a day as needed for red itchy eyes. - Avoid eye drops that say  red eye relief as they may contain medications that dry out your eyes.   Return in about 2 weeks (around 02/23/2024) for SKIN TESTING (1-55) . You can have the follow up appointment with Dr. Idolina Maker or a Nurse Practicioner (our Nurse Practitioners are excellent and always have Physician oversight!).   Subjective:   Lauren [name] is a 73 y.o. female presenting today for follow up of  Chief Complaint  Patient presents with   Follow-up    Asthma Having issues   Breathing Problem    Lauren [name] has a history of the following: Patient Active Problem List   Diagnosis Date Noted   CAD (coronary artery disease) 11/22/2023   Essential hypertension 11/22/2023   Pure hypercholesterolemia 11/22/2023   Cough variant asthma 08/09/2023   Seasonal and perennial allergic rhinitis 02/03/2023   Dysphagia 08/06/2020   Anxiety    Asthma-COPD overlap syndrome (HCC)    AKI (acute kidney injury) (HCC)    Acute medial meniscal tear 10/21/2015   GERD (gastroesophageal reflux disease) 01/21/2015   Fibromyalgia 09/11/2014   MDD (major depressive disorder) 03/19/2014   Pain 07/25/2012   Insomnia due to mental disorder 07/25/2012   OA (osteoarthritis) of knee 02/14/2012   Hepatitis C 02/14/2012   IBS (irritable bowel syndrome) 02/14/2012   Depression 12/27/2011    History obtained from: chart review and patient.  Discussed the use of AI scribe software for clinical note transcription with  the patient and/or guardian, who gave verbal consent to proceed.  Lauren [name] is a 73 y.o. female presenting for a follow up visit.  She was last seen in December 2024.  At that time, we continue with Astelin  as well as Flonase .  For her asthma COPD overlap, we continue with Symbicort  80 mcg 2 puffs twice daily as well as montelukast  and albuterol .  For her reflux, she continue with dietary and lifestyle modifications.  She was started on cromolyn  eyedrops for her allergic conjunctivitis.  Since last visit, she  has done somewhat well.  Asthma/Respiratory Symptom History: She has been experiencing worsening asthma symptoms, particularly nocturnally, despite using an air purifier with a HEPA filter in her bedroom. She recently added a second unit to improve air quality, but significant respiratory issues persist.  She has undergone heart-related evaluations, including a recent echocardiogram and myocardial perfusion test, both normal, alleviating concerns about heart-related symptoms.  Allergic Rhinitis Symptom History: She has a known allergy  to dust, identified in her fifties, and recently undertook a major deep cleaning of her house, which may have exacerbated her symptoms. Previous allergy  testing included indoor and outdoor allergens, as well as cats and dogs. She is currently using Astelin  and Flonase  for her symptoms and is concerned that antihistamines may have affected her last allergy  test results. She is interested in retesting to confirm her allergies.  She has diabetes and a history of COVID-19, contracted in January, and is frustrated with the impact of these conditions on her health and lifestyle.   Otherwise, there have been no changes to her past medical history, surgical history, family history, or social history.    Review of systems otherwise negative other than that mentioned in the HPI.    Objective:   Blood pressure 120/70, pulse 94, temperature 97.9 F (36.6 C), resp. rate 20, SpO2 96%. There is no height or weight on file to calculate BMI.    Physical Exam Vitals reviewed.  Constitutional:      Appearance: She is well-developed.  HENT:     Head: Normocephalic and atraumatic.     Right Ear: Tympanic membrane, ear canal and external ear normal. No drainage, swelling or tenderness. Tympanic membrane is not injected, scarred, erythematous, retracted or bulging.     Left Ear: Tympanic membrane, ear canal and external ear normal. No drainage, swelling or tenderness.  Tympanic membrane is not injected, scarred, erythematous, retracted or bulging.     Nose: Mucosal edema and rhinorrhea present. No nasal deformity or septal deviation.     Right Turbinates: Enlarged, swollen and pale.     Left Turbinates: Enlarged, swollen and pale.     Right Sinus: No maxillary sinus tenderness or frontal sinus tenderness.     Left Sinus: No maxillary sinus tenderness or frontal sinus tenderness.     Mouth/Throat:     Mouth: Mucous membranes are not pale and not dry.     Pharynx: Uvula midline.  Eyes:     General:        Right eye: No discharge.        Left eye: No discharge.     Conjunctiva/sclera: Conjunctivae normal.     Right eye: Right conjunctiva is not injected. No chemosis.    Left eye: Left conjunctiva is not injected. No chemosis.    Pupils: Pupils are equal, round, and reactive to light.  Cardiovascular:     Rate and Rhythm: Normal rate and regular rhythm.     Heart sounds: Normal heart  sounds.  Pulmonary:     Effort: Pulmonary effort is normal. No tachypnea, accessory muscle usage or respiratory distress.     Breath sounds: Normal breath sounds. No decreased breath sounds, wheezing, rhonchi or rales.  Chest:     Chest wall: No tenderness.  Lymphadenopathy:     Head:     Right side of head: No submandibular, tonsillar or occipital adenopathy.     Left side of head: No submandibular, tonsillar or occipital adenopathy.     Cervical: No cervical adenopathy.  Skin:    Coloration: Skin is not pale.     Findings: No abrasion, erythema, petechiae or rash. Rash is not papular, urticarial or vesicular.  Neurological:     Mental Status: She is alert.  Psychiatric:        Behavior: Behavior is cooperative.      Diagnostic studies: none       Drexel Gentles, MD  [facility] 

## 2024-02-21 ENCOUNTER — Ambulatory Visit (INDEPENDENT_AMBULATORY_CARE_PROVIDER_SITE_OTHER): Admitting: Psychiatry

## 2024-02-21 DIAGNOSIS — F332 Major depressive disorder, recurrent severe without psychotic features: Secondary | ICD-10-CM | POA: Diagnosis not present

## 2024-02-21 DIAGNOSIS — F411 Generalized anxiety disorder: Secondary | ICD-10-CM | POA: Diagnosis not present
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
## 2024-02-23 ENCOUNTER — Encounter: Payer: Self-pay | Admitting: Allergy & Immunology

## 2024-02-23 ENCOUNTER — Ambulatory Visit (INDEPENDENT_AMBULATORY_CARE_PROVIDER_SITE_OTHER): Admitting: Allergy & Immunology

## 2024-02-23 DIAGNOSIS — J4489 Other specified chronic obstructive pulmonary disease: Secondary | ICD-10-CM | POA: Diagnosis not present

## 2024-02-23 DIAGNOSIS — J302 Other seasonal allergic rhinitis: Secondary | ICD-10-CM | POA: Diagnosis not present

## 2024-02-23 DIAGNOSIS — J3089 Other allergic rhinitis: Secondary | ICD-10-CM

## 2024-02-23 NOTE — Patient Instructions (Signed)
 Allergic rhinitis - Testing today showed: grasses, ragweed, weeds, trees, indoor molds, outdoor molds, dust mites, cat, dog, and cockroach - Copy of test results provided.  - Continue with: Singulair  (montelukast ) 10mg  daily and Flonase  (fluticasone ) one spray per nostril daily (AIM FOR EAR ON EACH SIDE) - You can use an extra dose of the antihistamine, if needed, for breakthrough symptoms.  - Consider nasal saline rinses 1-2 times daily to remove allergens from the nasal cavities as well as help with mucous clearance (this is especially helpful to do before the nasal sprays are given) - We will start allergy  shots as a means of long-term control. - Allergy  shots re-train and reset the immune system to ignore environmental allergens and decrease the resulting immune response to those allergens (sneezing, itchy watery eyes, runny nose, nasal congestion, etc).    - Allergy  shots improve symptoms in 75-85% of patients.  - Make an appointment to start.   Asthma/COPD overlap - Continue Symbicort  80-2 puffs twice a day with a spacer to prevent cough or wheeze.  Call the clinic if your asthma is not well-controlled with Symbicort  - Continue montelukast  10 mg once a day to prevent cough or wheeze - Continue albuterol  2 puffs once every 4 hours as needed for cough or wheeze - You may use albuterol  2 puffs 15 minutes before activity to decrease cough or wheeze - We are getting some labs to look for serious difficult to control causes of asthma.   Reflux - Continue dietary and lifestyle modifications as listed below - Continue to follow-up with your gastrointestinal specialist as recommended  Allergic conjunctivitis - Begin Cromolyn  eye drops 2 drops in each eye up to 4 times a day.  - Some over the counter eye drops include Pataday one drop in each eye once a day as needed for red, itchy eyes OR Zaditor one drop in each eye twice a day as needed for red itchy eyes. - Avoid eye drops that say red  eye relief as they may contain medications that dry out your eyes.   Return in about 3 months (around 05/25/2024). You can have the follow up appointment with Dr. Idolina Maker or a Nurse Practicioner (our Nurse Practitioners are excellent and always have Physician oversight!).    Please inform us  of any Emergency Department visits, hospitalizations, or changes in symptoms. Call us  before going to the ED for breathing or allergy  symptoms since we might be able to fit you in for a sick visit. Feel free to contact us  anytime with any questions, problems, or concerns.  It was a pleasure to meet you today!  Websites that have reliable patient information: 1. American Academy of Asthma, Allergy , and Immunology: www.aaaai.org 2. Food Allergy  Research and Education (FARE): foodallergy.org 3. Mothers of Asthmatics: http://www.asthmacommunitynetwork.org 4. American College of Allergy , Asthma, and Immunology: www.acaai.org      "Like" us  on Facebook and Instagram for our latest updates!      A healthy democracy works best when [employer] participate! Make sure you are registered to vote! If you have moved or changed any of your contact information, you will need to get this updated before voting! Scan the QR codes below to learn more!        [facility] - 02/23/24 1406     Time Antigen Placed 1407    Allergen Manufacturer Floyd Hutchinson    Location Back    Number of Test 55    Panel 1 Select    1. Control-Buffer  50% Glycerol Negative    2. Control-Histamine 2+    3. [place] 3+    4. [place] 3+    5. Johnson 3+    6. [place]  Blue 3+    7. Meadow Fescue Negative    8. Perennial Rye Negative    9. Timothy Negative    10. Ragweed Mix Negative    11. Cocklebur 2+    12. Plantain,  English 2+    13. Baccharis 2+    14. Dog Fennel Negative    15. Russian Thistle Negative    16. Lamb's Quarters Negative    17. Sheep Sorrell 2+    18. Rough Pigweed 2+    19. Marsh Elder, Rough 2+    20.  Mugwort, Common 2+    21. Box, Elder Negative    22. Cedar, red 2+    23. Sweet Gum 2+    24. Pecan Pollen 3+    25. Pine Mix 2+    26. Walnut, Black Pollen Negative    27. Red Mulberry 2+    28. Ash Mix Negative    29. Birch Mix 3+    30. Beech American 2+    31. Cottonwood, [place] 2+    32. Hickory, White 3+    33. Maple Mix 2+    34. Oak, [place] Mix Negative    35. Sycamore Eastern 2+    36. Alternaria Alternata 2+    37. Cladosporium Herbarum 2+    38. Aspergillus Mix Negative    39. Penicillium Mix 2+    40. Bipolaris Sorokiniana (Helminthosporium) Negative    41. Drechslera Spicifera (Curvularia) Negative    42. Mucor Plumbeus 3+    43. Fusarium Moniliforme Negative    44. Aureobasidium Pullulans (pullulara) 3+    45. Rhizopus Oryzae Negative    46. Botrytis Cinera Negative    47. Epicoccum Nigrum Negative    48. Phoma Betae 3+    49. Dust Mite Mix 3+    50. Cat Hair 10,000 BAU/ml 3+    51.  Dog Epithelia 2+    52. Mixed Feathers Negative    53. Horse Epithelia Negative    54. Cockroach, German 2+    55. Tobacco Leaf Negative          Reducing Pollen Exposure  The American Academy of Allergy , Asthma and Immunology suggests the following steps to reduce your exposure to pollen during allergy  seasons.    Do not hang sheets or clothing out to dry; pollen may collect on these items. Do not mow lawns or spend time around freshly cut grass; mowing stirs up pollen. Keep windows closed at night.  Keep car windows closed while driving. Minimize morning activities outdoors, a time when pollen counts are usually at their highest. Stay indoors as much as possible when pollen counts or humidity is high and on windy days when pollen tends to remain in the air longer. Use air conditioning when possible.  Many air conditioners have filters that trap the pollen spores. Use a HEPA room air filter to remove pollen form the indoor air you breathe.  Control of Mold Allergen    Mold and fungi can grow on a variety of surfaces provided certain temperature and moisture conditions exist.  Outdoor molds grow on plants, decaying vegetation and soil.  The major outdoor mold, Alternaria and Cladosporium, are found in very high numbers during hot and dry conditions.  Generally, a late Summer - Fall peak is seen  for common outdoor fungal spores.  Rain will temporarily lower outdoor mold spore count, but counts rise rapidly when the rainy period ends.  The most important indoor molds are Aspergillus and Penicillium.  Dark, humid and poorly ventilated basements are ideal sites for mold growth.  The next most common sites of mold growth are the bathroom and the kitchen.  Outdoor (Seasonal) Mold Control   Use air conditioning and keep windows closed Avoid exposure to decaying vegetation. Avoid leaf raking. Avoid grain handling. Consider wearing a face mask if working in moldy areas.    Indoor (Perennial) Mold Control    Maintain humidity below 50%. Clean washable surfaces with 5% bleach solution. Remove sources e.g. contaminated carpets.    Control of Dust Mite Allergen    Dust mites play a major role in allergic asthma and rhinitis.  They occur in environments with high humidity wherever human skin is found.  Dust mites absorb humidity from the atmosphere (ie, they do not drink) and feed on organic matter (including shed human and animal skin).  Dust mites are a microscopic type of insect that you cannot see with the naked eye.  High levels of dust mites have been detected from mattresses, pillows, carpets, upholstered furniture, bed covers, clothes, soft toys and any woven material.  The principal allergen of the dust mite is found in its feces.  A gram of dust may contain 1,000 mites and 250,000 fecal particles.  Mite antigen is easily measured in the air during house cleaning activities.  Dust mites do not bite and do not cause harm to humans, other than by triggering  allergies/asthma.    Ways to decrease your exposure to dust mites in your home:  Encase mattresses, box springs and pillows with a mite-impermeable barrier or cover   Wash sheets, blankets and drapes weekly in hot water  (130 F) with detergent and dry them in a dryer on the hot setting.  Have the room cleaned frequently with a vacuum cleaner and a damp dust-mop.  For carpeting or rugs, vacuuming with a vacuum cleaner equipped with a high-efficiency particulate air (HEPA) filter.  The dust mite allergic individual should not be in a room which is being cleaned and should wait 1 hour after cleaning before going into the room. Do not sleep on upholstered furniture (eg, couches).   If possible removing carpeting, upholstered furniture and drapery from the home is ideal.  Horizontal blinds should be eliminated in the rooms where the person spends the most time (bedroom, study, television room).  Washable vinyl, roller-type shades are optimal. Remove all non-washable stuffed toys from the bedroom.  Wash stuffed toys weekly like sheets and blankets above.   Reduce indoor humidity to less than 50%.  Inexpensive humidity monitors can be purchased at most hardware stores.  Do not use a humidifier as can make the problem worse and are not recommended.  Control of Cockroach Allergen  Cockroach allergen has been identified as an important cause of acute attacks of asthma, especially in urban settings.  There are fifty-five species of cockroach that exist in the [place] , however only three, the [place], [place] and [place] species produce allergen that can affect patients with Asthma.  Allergens can be obtained from fecal particles, egg casings and secretions from cockroaches.    Remove food sources. Reduce access to water . Seal access and entry points. Spray runways with 0.5-1% Diazinon or Chlorpyrifos Blow boric acid power under stoves and refrigerator. Place bait stations (hydramethylnon) at feeding  sites.  Control of Dog or Cat Allergen  Avoidance is the best way to manage a dog or cat allergy . If you have a dog or cat and are allergic to dog or cats, consider removing the dog or cat from the home. If you have a dog or cat but don't want to find it a new home, or if your family wants a pet even though someone in the household is allergic, here are some strategies that may help keep symptoms at bay:  Keep the pet out of your bedroom and restrict it to only a few rooms. Be advised that keeping the dog or cat in only one room will not limit the allergens to that room. Don't pet, hug or kiss the dog or cat; if you do, wash your hands with soap and water . High-efficiency particulate air (HEPA) cleaners run continuously in a bedroom or living room can reduce allergen levels over time. Regular use of a high-efficiency vacuum cleaner or a central vacuum can reduce allergen levels. Giving your dog or cat a bath at least once a week can reduce airborne allergen.  Allergy  Shots  Allergies are the result of a chain reaction that starts in the immune system. Your immune system controls how your body defends itself. For instance, if you have an allergy  to pollen, your immune system identifies pollen as an invader or allergen. Your immune system overreacts by producing antibodies called Immunoglobulin E (IgE). These antibodies travel to cells that release chemicals, causing an allergic reaction.  The concept behind allergy  immunotherapy, whether it is received in the form of shots or tablets, is that the immune system can be desensitized to specific allergens that trigger allergy  symptoms. Although it requires time and patience, the payback can be long-term relief. Allergy  injections contain a dilute solution of those substances that you are allergic to based upon your skin testing and allergy  history.   How Do Allergy  Shots Work?  Allergy  shots work much like a vaccine. Your body responds to injected  amounts of a particular allergen given in increasing doses, eventually developing a resistance and tolerance to it. Allergy  shots can lead to decreased, minimal or no allergy  symptoms.  There generally are two phases: build-up and maintenance. Build-up often ranges from three to six months and involves receiving injections with increasing amounts of the allergens. The shots are typically given once or twice a week, though more rapid build-up schedules are sometimes used.  The maintenance phase begins when the most effective dose is reached. This dose is different for each person, depending on how allergic you are and your response to the build-up injections. Once the maintenance dose is reached, there are longer periods between injections, typically two to four weeks.  Occasionally doctors give cortisone-type shots that can temporarily reduce allergy  symptoms. These types of shots are different and should not be confused with allergy  immunotherapy shots.  Who Can Be Treated with Allergy  Shots?  Allergy  shots may be a good treatment approach for people with allergic rhinitis (hay fever), allergic asthma, conjunctivitis (eye allergy ) or stinging insect allergy .   Before deciding to begin allergy  shots, you should consider:   The length of allergy  season and the severity of your symptoms  Whether medications and/or changes to your environment can control your symptoms  Your desire to avoid long-term medication use  Time: allergy  immunotherapy requires a major time commitment  Cost: may vary depending on your insurance coverage  Allergy  shots for children age 85 and older  are effective and often well tolerated. They might prevent the onset of new allergen sensitivities or the progression to asthma.  Allergy  shots are not started on patients who are pregnant but can be continued on patients who become pregnant while receiving them. In some patients with other medical conditions or who take certain  common medications, allergy  shots may be of risk. It is important to mention other medications you talk to your allergist.   What are the two types of build-ups offered:   RUSH or Rapid Desensitization -- one day of injections lasting from 8:30-4:30pm, injections every 1 hour.  Approximately half of the build-up process is completed in that one day.  The following week, normal build-up is resumed, and this entails ~16 visits either weekly or twice weekly, until reaching your "maintenance dose" which is continued weekly until eventually getting spaced out to every month for a duration of 3 to 5 years. The regular build-up appointments are nurse visits where the injections are administered, followed by required monitoring for 30 minutes.    Traditional build-up -- weekly visits for 6 -12 months until reaching "maintenance dose", then continue weekly until eventually spacing out to every 4 weeks as above. At these appointments, the injections are administered, followed by required monitoring for 30 minutes.     Either way is acceptable, and both are equally effective. With the rush protocol, the advantage is that less time is spent here for injections overall AND you would also reach maintenance dosing faster (which is when the clinical benefit starts to become more apparent). Not everyone is a candidate for rapid desensitization.   IF we proceed with the RUSH protocol, there are premedications which must be taken the day before and the day after the rush only (this includes antihistamines, steroids, and Singulair ).  After the rush day, no prednisone  or Singulair  is required, and we just recommend antihistamines taken on your injection day.  What Is An Estimate of the Costs?  If you are interested in starting allergy  injections, please check with your insurance company about your coverage for both allergy  vial sets and allergy  injections.  Please do so prior to making the appointment to start  injections.  The following are CPT codes to give to your insurance company. These are the amounts we BILL to the insurance company, but the amount YOU WILL PAY and WE RECEIVE IS SUBSTANTIALLY LESS and depends on the contracts we have with different insurance companies.   Amount Billed to Insurance One allergy  vial set  CPT 95165   $ 1200     Two allergy  vial set  CPT 95165   $ 2400     Three allergy  vial set  CPT 95165   $ 3600     One injection   CPT 95115   $ 35  Two injections   CPT 95117   $ 40 RUSH (Rapid Desensitization) CPT 95180 x 8 hours $500/hour  Regarding the allergy  injections, your co-pay may or may not apply with each injection, so please confirm this with your insurance company. When you start allergy  injections, 1 or 2 sets of vials are made based on your allergies.  Not all patients can be on one set of vials. A set of vials lasts 6 months to a year depending on how quickly you can proceed with your build-up of your allergy  injections. Vials are personalized for each patient depending on their specific allergens.  How often are allergy  injection given during the build-up period?  Injections are given at least weekly during the build-up period until your maintenance dose is achieved. Per the doctor's discretion, you may have the option of getting allergy  injections two times per week during the build-up period. However, there must be at least 48 hours between injections. The build-up period is usually completed within 6-12 months depending on your ability to schedule injections and for adjustments for reactions. When maintenance dose is reached, your injection schedule is gradually changed to every two weeks and later to every three weeks. Injections will then continue every 4 weeks. Usually, injections are continued for a total of 3-5 years.   When Will I Feel Better?  Some may experience decreased allergy  symptoms during the build-up phase. For others, it may take as long as 12  months on the maintenance dose. If there is no improvement after a year of maintenance, your allergist will discuss other treatment options with you.  If you aren't responding to allergy  shots, it may be because there is not enough dose of the allergen in your vaccine or there are missing allergens that were not identified during your allergy  testing. Other reasons could be that there are high levels of the allergen in your environment or major exposure to non-allergic triggers like tobacco smoke.  What Is the Length of Treatment?  Once the maintenance dose is reached, allergy  shots are generally continued for three to five years. The decision to stop should be discussed with your allergist at that time. Some people may experience a permanent reduction of allergy  symptoms. Others may relapse and a longer course of allergy  shots can be considered.  What Are the Possible Reactions?  The two types of adverse reactions that can occur with allergy  shots are local and systemic. Common local reactions include very mild redness and swelling at the injection site, which can happen immediately or several hours after. Report a delayed reaction from your last injection. These include arm swelling or runny nose, watery eyes or cough that occurs within 12-24 hours after injection. A systemic reaction, which is less common, affects the entire body or a particular body system. They are usually mild and typically respond quickly to medications. Signs include increased allergy  symptoms such as sneezing, a stuffy nose or hives.   Rarely, a serious systemic reaction called anaphylaxis can develop. Symptoms include swelling in the throat, wheezing, a feeling of tightness in the chest, nausea or dizziness. Most serious systemic reactions develop within 30 minutes of allergy  shots. This is why it is strongly recommended you wait in your doctor's office for 30 minutes after your injections. Your allergist is trained to watch  for reactions, and his or her staff is trained and equipped with the proper medications to identify and treat them.   Report to the nurse immediately if you experience any of the following symptoms: swelling, itching or redness of the skin, hives, watery eyes/nose, breathing difficulty, excessive sneezing, coughing, stomach pain, diarrhea, or light headedness. These symptoms may occur within 15-20 minutes after injection and may require medication.   Who Should Administer Allergy  Shots?  The preferred location for receiving shots is your prescribing allergist's office. Injections can sometimes be given at another facility where the physician and staff are trained to recognize and treat reactions, and have received instructions by your prescribing allergist.  What if I am late for an injection?   Injection dose will be adjusted depending upon how many days or weeks you are late for your injection.   What  if I am sick?   Please report any illness to the nurse before receiving injections. She may adjust your dose or postpone injections depending on your symptoms. If you have fever, flu, sinus infection or chest congestion it is best to postpone allergy  injections until you are better. Never get an allergy  injection if your asthma is causing you problems. If your symptoms persist, seek out medical care to get your health problem under control.  What If I am or Become Pregnant:  Women that become pregnant should schedule an appointment with [facility] before receiving any further allergy  injections.

## 2024-02-23 NOTE — Progress Notes (Signed)
 FOLLOW UP  Date of Service/Encounter:  02/23/24   Assessment:   Perennial and seasonal allergic rhinitis (grasses, ragweed, weeds, trees, indoor molds, outdoor molds, dust mites, cat, dog, and cockroach) - interested in starting rush immunotherapy   Asthma-COPD overlap syndrome   Irritable bowel syndrome    Rosacea   Depression  Plan/Recommendations:   Allergic rhinitis - Testing today showed: grasses, ragweed, weeds, trees, indoor molds, outdoor molds, dust mites, cat, dog, and cockroach - Copy of test results provided.  - Continue with: Singulair  (montelukast ) 10mg  daily and Flonase  (fluticasone ) one spray per nostril daily (AIM FOR EAR ON EACH SIDE) - You can use an extra dose of the antihistamine, if needed, for breakthrough symptoms.  - Consider nasal saline rinses 1-2 times daily to remove allergens from the nasal cavities as well as help with mucous clearance (this is especially helpful to do before the nasal sprays are given) - We will start allergy  shots as a means of long-term control. - Allergy  shots re-train and reset the immune system to ignore environmental allergens and decrease the resulting immune response to those allergens (sneezing, itchy watery eyes, runny nose, nasal congestion, etc).    - Allergy  shots improve symptoms in 75-85% of patients.  - Make an appointment to start.   Asthma/COPD overlap - Continue Symbicort  80-2 puffs twice a day with a spacer to prevent cough or wheeze.  Call the clinic if your asthma is not well-controlled with Symbicort  - Continue montelukast  10 mg once a day to prevent cough or wheeze - Continue albuterol  2 puffs once every 4 hours as needed for cough or wheeze - You may use albuterol  2 puffs 15 minutes before activity to decrease cough or wheeze - We are getting some labs to look for serious difficult to control causes of asthma.   Reflux - Continue dietary and lifestyle modifications as listed below - Continue to  follow-up with your gastrointestinal specialist as recommended  Allergic conjunctivitis - Begin Cromolyn  eye drops 2 drops in each eye up to 4 times a day.  - Some over the counter eye drops include Pataday one drop in each eye once a day as needed for red, itchy eyes OR Zaditor one drop in each eye twice a day as needed for red itchy eyes. - Avoid eye drops that say red eye relief as they may contain medications that dry out your eyes.   Return in about 3 months (around 05/25/2024). You can have the follow up appointment with Dr. Idolina Maker or a Nurse Practicioner (our Nurse Practitioners are excellent and always have Physician oversight!).   Subjective:   Lauren [name] is a 73 y.o. female presenting today for follow up of No chief complaint on file.   Lauren [name] has a history of the following: Patient Active Problem List   Diagnosis Date Noted   CAD (coronary artery disease) 11/22/2023   Essential hypertension 11/22/2023   Pure hypercholesterolemia 11/22/2023   Cough variant asthma 08/09/2023   Seasonal and perennial allergic rhinitis 02/03/2023   Dysphagia 08/06/2020   Anxiety    Asthma-COPD overlap syndrome (HCC)    AKI (acute kidney injury) (HCC)    Acute medial meniscal tear 10/21/2015   GERD (gastroesophageal reflux disease) 01/21/2015   Fibromyalgia 09/11/2014   MDD (major depressive disorder) 03/19/2014   Pain 07/25/2012   Insomnia due to mental disorder 07/25/2012   OA (osteoarthritis) of knee 02/14/2012   Hepatitis C 02/14/2012   IBS (irritable bowel syndrome) 02/14/2012  Depression 12/27/2011    History obtained from: chart review and patient.  Discussed the use of AI scribe software for clinical note transcription with the patient and/or guardian, who gave verbal consent to proceed.  Lauren [name] is a 73 y.o. female presenting for skin testing. She was last seen on June 6th. We could not do testing because her insurance company does not cover testing on the  same day as a New Patient visit. She has been off of all antihistamines 3 days in anticipation of the testing.   At that visit, we continue with Astelin  and Flonase .  For her asthma COPD overlap, we continue with Symbicort  80 mcg 2 puffs twice daily, montelukast , and albuterol  as needed.  For her reflux, she continue to follow with her GI doctor.  Otherwise, there have been no changes to her past medical history, surgical history, family history, or social history.    Review of systems otherwise negative other than that mentioned in the HPI.    Objective:   There were no vitals taken for this visit. There is no height or weight on file to calculate BMI.    Physical exam deferred since this was a skin testing appointment only.   Diagnostic studies:   Allergy  Studies:     Airborne Adult Perc - 02/23/24 1406     Time Antigen Placed 1407    Allergen Manufacturer Floyd Hutchinson    Location Back    Number of Test 55    Panel 1 Select    1. Control-Buffer 50% Glycerol Negative    2. Control-Histamine 2+    3. Bahia 3+    4. [place] 3+    5. Johnson 3+    6. [place]  Blue 3+    7. Meadow Fescue Negative    8. Perennial Rye Negative    9. Timothy Negative    10. Ragweed Mix Negative    11. Cocklebur 2+    12. Plantain,  English 2+    13. Baccharis 2+    14. Dog Fennel Negative    15. Russian Thistle Negative    16. Lamb's Quarters Negative    17. Sheep Sorrell 2+    18. Rough Pigweed 2+    19. Marsh Elder, Rough 2+    20. Mugwort, Common 2+    21. Box, Elder Negative    22. Cedar, red 2+    23. Sweet Gum 2+    24. Pecan Pollen 3+    25. Pine Mix 2+    26. Walnut, Black Pollen Negative    27. Red Mulberry 2+    28. Ash Mix Negative    29. Birch Mix 3+    30. Beech American 2+    31. Cottonwood, [place] 2+    32. Hickory, White 3+    33. Maple Mix 2+    34. Oak, [place] Mix Negative    35. Sycamore Eastern 2+    36. Alternaria Alternata 2+    37. Cladosporium Herbarum  2+    38. Aspergillus Mix Negative    39. Penicillium Mix 2+    40. Bipolaris Sorokiniana (Helminthosporium) Negative    41. Drechslera Spicifera (Curvularia) Negative    42. Mucor Plumbeus 3+    43. Fusarium Moniliforme Negative    44. Aureobasidium Pullulans (pullulara) 3+    45. Rhizopus Oryzae Negative    46. Botrytis Cinera Negative    47. Epicoccum Nigrum Negative    48. Phoma Betae 3+    49. Dust  Mite Mix 3+    50. Cat Hair 10,000 BAU/ml 3+    51.  Dog Epithelia 2+    52. Mixed Feathers Negative    53. Horse Epithelia Negative    54. Cockroach, German 2+    55. Tobacco Leaf Negative          Allergy  testing results were read and interpreted by myself, documented by clinical staff.      Drexel Gentles, MD  [facility] 

## 2024-02-24 ENCOUNTER — Other Ambulatory Visit (HOSPITAL_COMMUNITY): Payer: Self-pay | Admitting: Psychiatry

## 2024-02-28 LAB — CBC WITH DIFFERENTIAL/PLATELET
Basophils Absolute: 0 10*3/uL (ref 0.0–0.2)
Basos: 1 %
EOS (ABSOLUTE): 0.3 10*3/uL (ref 0.0–0.4)
Eos: 3 %
Hematocrit: 36.8 % (ref 34.0–46.6)
Hemoglobin: 12 g/dL (ref 11.1–15.9)
Immature Grans (Abs): 0.1 10*3/uL (ref 0.0–0.1)
Immature Granulocytes: 1 %
Lymphocytes Absolute: 1.7 10*3/uL (ref 0.7–3.1)
Lymphs: 19 %
MCH: 29.8 pg (ref 26.6–33.0)
MCHC: 32.6 g/dL (ref 31.5–35.7)
MCV: 91 fL (ref 79–97)
Monocytes Absolute: 0.7 10*3/uL (ref 0.1–0.9)
Monocytes: 8 %
Neutrophils Absolute: 6 10*3/uL (ref 1.4–7.0)
Neutrophils: 68 %
Platelets: 262 10*3/uL (ref 150–450)
RBC: 4.03 x10E6/uL (ref 3.77–5.28)
RDW: 13.2 % (ref 11.7–15.4)
WBC: 8.7 10*3/uL (ref 3.4–10.8)

## 2024-02-28 LAB — IGE: IgE (Immunoglobulin E), Serum: 71 [IU]/mL (ref 6–495)

## 2024-02-28 LAB — ASPERGILLUS PRECIPITINS

## 2024-02-28 LAB — ALPHA-1-ANTITRYPSIN: A-1 Antitrypsin: 155 mg/dL (ref 101–187)

## 2024-02-28 LAB — ANCA TITERS

## 2024-03-04 ENCOUNTER — Ambulatory Visit: Payer: Self-pay | Admitting: Allergy & Immunology

## 2024-03-04 DIAGNOSIS — J4550 Severe persistent asthma, uncomplicated: Secondary | ICD-10-CM | POA: Diagnosis not present

## 2024-03-04 DIAGNOSIS — J4520 Mild intermittent asthma, uncomplicated: Secondary | ICD-10-CM | POA: Diagnosis not present

## 2024-03-04 DIAGNOSIS — E7800 Pure hypercholesterolemia, unspecified: Secondary | ICD-10-CM | POA: Diagnosis not present

## 2024-03-04 DIAGNOSIS — E1169 Type 2 diabetes mellitus with other specified complication: Secondary | ICD-10-CM | POA: Diagnosis not present

## 2024-03-05 ENCOUNTER — Other Ambulatory Visit: Payer: Self-pay | Admitting: Internal Medicine

## 2024-03-05 ENCOUNTER — Other Ambulatory Visit: Payer: Self-pay

## 2024-03-05 DIAGNOSIS — J45991 Cough variant asthma: Secondary | ICD-10-CM

## 2024-03-05 MED ORDER — ALBUTEROL SULFATE HFA 108 (90 BASE) MCG/ACT IN AERS
2.0000 | INHALATION_SPRAY | RESPIRATORY_TRACT | 3 refills | Status: AC | PRN
Start: 2024-03-05 — End: ?

## 2024-03-05 NOTE — Telephone Encounter (Unsigned)
 Copied from CRM [id number]. Topic: Clinical - Medication Refill >> Mar 05, 2024  1:16 PM Chantha C wrote: Most Recent Pulmonary Care Visit:  Provider: Dr. Ozell America Department: [facility] Pulmonary Care Date: 11/14/23   Medication(s): albuterol  (VENTOLIN  HFA) 108 (90 Base) MCG/ACT inhaler    Has the patient contacted their pharmacy? No (Agent: If no, request that the patient contact the pharmacy for the refill. If patient does not wish to contact the pharmacy document the reason why and proceed with request.) (Agent: If yes, when and what did the pharmacy advise?)  This is the patient's preferred pharmacy:  [id number] - [place], [place] - [address] AT [facility] [address] [place] [address] Phone: [phone] Fax: [phone]  Is this the correct pharmacy for this prescription? No If no, delete pharmacy and type the correct one.   Has the prescription been filled recently? No  Is the patient out of the medication? Yes, patient seem to misplace the medication and does not have any medication. Patient is asking for the refill to be sent urgently today because the holiday this Friday.    Has the patient been seen for an appointment in the last year OR does the patient have an upcoming appointment? Yes  Can we respond through MyChart? No, please call [phone]  Agent: Please be advised that Rx refills may take up to 3 business days. We ask that you follow-up with your pharmacy.

## 2024-03-06 ENCOUNTER — Telehealth (HOSPITAL_COMMUNITY): Payer: Self-pay | Admitting: *Deleted

## 2024-03-06 ENCOUNTER — Encounter (HOSPITAL_COMMUNITY): Payer: Self-pay

## 2024-03-06 ENCOUNTER — Ambulatory Visit (HOSPITAL_COMMUNITY): Admitting: Psychiatry

## 2024-03-06 NOTE — Telephone Encounter (Signed)
 provider out today. Called patient and lmom informing pt that provider is off today and need to call office to schedule one more appt

## 2024-03-22 DIAGNOSIS — J3081 Allergic rhinitis due to animal (cat) (dog) hair and dander: Secondary | ICD-10-CM | POA: Diagnosis not present

## 2024-03-22 NOTE — Progress Notes (Signed)
 Aeroallergen Immunotherapy  Ordering Provider: Dr. Marty Shaggy  Patient Details Name: Lauren [name] MRN: [id number] Date of Birth: Feb 02, 1951  Order 1 of 2  Vial Label: G/W/T/C  0.3 ml (Volume)  BAU Concentration -- 7 Grass Mix* 100,000 ([place]  [place], Cassel, Teaticket, Perennial Rye, RedTop, Sweet Vernal, Timothy) 0.2 ml (Volume)  1:20 Concentration -- Bahia 0.3 ml (Volume)  BAU Concentration -- [place] Southern Territ[place] 10,000 0.2 ml (Volume)  1:20 Concentration -- Johnson 0.2 ml (Volume)  1:20 Concentration -- Cocklebur 0.2 ml (Volume)  1:40 Concentration -- Baccharis 0.5 ml (Volume)  1:20 Concentration -- Weed Mix* 0.5 ml (Volume)  1:20 Concentration -- Eastern 10 Tree Mix (also Sweet Gum) 0.2 ml (Volume)  1:10 Concentration -- Cedar, red 0.2 ml (Volume)  1:10 Concentration -- Pecan Pollen 0.2 ml (Volume)  1:20 Concentration -- Red Mulberry 0.5 ml (Volume)  1:10 Concentration -- Cat Hair   3.5  ml Extract Subtotal 1.5  ml Diluent  5.0  ml Maintenance Total  Schedule:  B  Blue Vial (1:100,000): Schedule B (6 doses) Yellow Vial (1:10,000): Schedule B (6 doses) Green Vial (1:1,000): Schedule B (6 doses) Red Vial (1:100): Schedule A (12 doses)  Special Instructions: After completion of the first Red Vial, please space to every two weeks. After completion of the second Red Vial, please space to every 4 weeks. Ok to up dose new vials at 0.35mL --> 0.3 mL --> 0.5 mL. Ok to come twice weekly, if desired, as long as there is 48 hours between injections.

## 2024-03-22 NOTE — Progress Notes (Signed)
 VIALS MADE ON 03/22/24

## 2024-03-22 NOTE — Progress Notes (Signed)
 Aeroallergen Immunotherapy  Ordering Provider: Dr. Marty Shaggy  Patient Details Name: Lauren [name] MRN: [id number] Date of Birth: 1950/09/10  Order 2 of 2  Vial Label: RW/Molds/DM/CR  0.6 ml (Volume)  1:20 Concentration -- Ragweed Mix 0.2 ml (Volume)  1:20 Concentration -- Alternaria alternata 0.2 ml (Volume)  1:20 Concentration -- Cladosporium herbarum 0.2 ml (Volume)  1:10 Concentration -- Penicillium mix 0.2 ml (Volume)  1:10 Concentration -- Mucor plumbeus 0.2 ml (Volume)  1:40 Concentration -- Aureobasidium pullulans 0.2 ml (Volume)  1:40 Concentration -- Phoma betae 0.3 ml (Volume)  1:20 Concentration -- Cockroach, German 0.5 ml (Volume)   AU Concentration -- Mite Mix (DF 5,000 & DP 5,000)   2.6  ml Extract Subtotal 2.4  ml Diluent  5.0  ml Maintenance Total  Schedule:  B  Blue Vial (1:100,000): Schedule B (6 doses) Yellow Vial (1:10,000): Schedule B (6 doses) Green Vial (1:1,000): Schedule B (6 doses) Red Vial (1:100): Schedule A (12 doses)  Special Instructions: After completion of the first Red Vial, please space to every two weeks. After completion of the second Red Vial, please space to every 4 weeks. Ok to up dose new vials at 0.50mL --> 0.3 mL --> 0.5 mL. Ok to come twice weekly, if desired, as long as there is 48 hours between injections.

## 2024-03-22 NOTE — Addendum Note (Signed)
 Addended by: IVA MARTY SALTNESS on: 03/22/2024 01:55 PM   Modules accepted: Orders

## 2024-03-25 DIAGNOSIS — J3089 Other allergic rhinitis: Secondary | ICD-10-CM | POA: Diagnosis not present

## 2024-03-26 ENCOUNTER — Ambulatory Visit: Admitting: Emergency Medicine

## 2024-03-26 NOTE — Progress Notes (Deleted)
  Cardiology Office Note:    Date:  03/26/2024  ID:  Lauren [name], DOB October 23, 1950, MRN [id number] PCP: Rolinda Millman, MD  [facility] Providers Cardiologist:  Maude Emmer, MD { Click to update primary MD,subspecialty MD or APP then REFRESH:1}    {Click to Open Review  :1}   Patient Profile:       Chief Complaint: *** History of Present Illness:  Lauren [name] is a 73 y.o. female with visit-pertinent history of chronic pain, COPD, anxiety, hypertension, hyperlipidemia, GERD, exertional dyspnea, fibromyalgia rheumatoid arthritis, osteoarthritis, asthma   She had an echocardiogram and Myoview  in 2019 which were both normal.  She is on disability for history of fibromyalgia.  Previous EtOH abuse, sober for 26 years.   Patient was seen in office on 06/07/2022.  It was noted that she was a very poor historian.  She had noted ongoing but worsening chronic dyspnea on exertion and chest pressure.  Coronary CTA was ordered and completed on 07/01/2022 showing coronary Score of 581 (92nd Percentile), Mild Calcified Plaque in the Proximal Mid LAD 25-29% and minimal CAD less than 25% RCA/LCx.   She recently called into the nurse triage line on 11/20/2023 with complaints of left arm pain and trouble breathing.  Patient was last seen in office on 01/03/2024.  She presented with left arm and jaw pain that occurred twice within the last 2 months with each episode lasting 5 to 10 minutes and triggered by stress.  Her blood pressure was elevated at 152/92 and her losartan  was increased from 50 to 100 mg daily.  Echocardiogram was ordered and completed on 6/5 showed LVEF 60 to 65%, no RWMA, mild LVH, grade 1 DD, RV function and size normal, normal PASP, trivial MR.  Lexiscan  Myoview  was completed on 6/5 showing no evidence of ischemia or infarction and low risk study.  Discussed the use of AI scribe software for clinical note transcription with the patient, who gave verbal consent to  proceed.  History of Present Illness     Review of systems:  Please see the history of present illness. All other systems are reviewed and otherwise negative. ***      Studies Reviewed:        ***  Risk Assessment/Calculations:   {Does this patient have ATRIAL FIBRILLATION?[phone]} No BP recorded.  {Refresh Note OR Click here to enter BP  :1}***        Physical Exam:   VS:  There were no vitals taken for this visit.   Wt Readings from Last 3 Encounters:  02/08/24 154 lb (69.9 kg)  01/03/24 154 lb (69.9 kg)  11/14/23 155 lb 3.2 oz (70.4 kg)    GEN: Well nourished, well developed in no acute distress NECK: No JVD; No carotid bruits CARDIAC: ***RRR, no murmurs, rubs, gallops RESPIRATORY:  Clear to auscultation without rales, wheezing or rhonchi  ABDOMEN: Soft, non-tender, non-distended EXTREMITIES:  No edema; No acute deformity ***      Assessment and Plan:    Assessment and Plan Assessment & Plan      {Are you ordering a CV Procedure (e.g. stress test, cath, DCCV, TEE, etc)?   Press F2        :[id number]}  Dispo:  No follow-ups on file.  Signed, Lum LITTIE Louis, NP

## 2024-03-27 ENCOUNTER — Telehealth: Payer: Self-pay | Admitting: *Deleted

## 2024-03-27 NOTE — Telephone Encounter (Signed)
 Tried to call patient to discuss Lauren [name] but unable to leave message due to voicemail full. She did call back but was cut off

## 2024-03-27 NOTE — Telephone Encounter (Signed)
 Spoke to patient and will mail PAP form for Fasenra

## 2024-03-27 NOTE — Telephone Encounter (Signed)
 Spoke to patient regarding PAP for Lauren [name] will mail PAP form to patient to start process

## 2024-04-02 NOTE — Telephone Encounter (Signed)
 Thanks, Tammy.

## 2024-04-04 DIAGNOSIS — J4520 Mild intermittent asthma, uncomplicated: Secondary | ICD-10-CM | POA: Diagnosis not present

## 2024-04-04 DIAGNOSIS — J4550 Severe persistent asthma, uncomplicated: Secondary | ICD-10-CM | POA: Diagnosis not present

## 2024-04-04 DIAGNOSIS — E7800 Pure hypercholesterolemia, unspecified: Secondary | ICD-10-CM | POA: Diagnosis not present

## 2024-04-04 DIAGNOSIS — E1169 Type 2 diabetes mellitus with other specified complication: Secondary | ICD-10-CM | POA: Diagnosis not present

## 2024-04-08 ENCOUNTER — Other Ambulatory Visit: Payer: Self-pay | Admitting: Internal Medicine

## 2024-04-15 ENCOUNTER — Telehealth: Payer: Self-pay

## 2024-04-15 ENCOUNTER — Ambulatory Visit (INDEPENDENT_AMBULATORY_CARE_PROVIDER_SITE_OTHER)

## 2024-04-15 DIAGNOSIS — J309 Allergic rhinitis, unspecified: Secondary | ICD-10-CM | POA: Diagnosis not present

## 2024-04-15 DIAGNOSIS — J302 Other seasonal allergic rhinitis: Secondary | ICD-10-CM

## 2024-04-15 NOTE — Telephone Encounter (Signed)
 Patient came in today and started her immunotherapy injections. Patient was alarmed as she did not see that dog was in her vials. She expressed that she has a dog and that's why she started injections because of her reactions. Please advise on if dogs should be added and how to add into her vials that here in [place] that she's already began. Thank you!

## 2024-04-15 NOTE — Progress Notes (Signed)
 Immunotherapy   Patient Details  Name: Lauren [name] MRN: [id number] Date of Birth: Oct 07, 1950  04/15/2024  [name] started injections for  grasses, ragweed's, weeds, trees, cats, molds, dust mites, and cockroaches.  Following schedule: B  Frequency:2 times per week Epi-Pen:Prescription for Epi-Pen given Consent signed previously and patient instructions given. Patient sat in the lobby for thirty minutes without an issue.    Santana DELENA Eck 04/15/2024, 2:33 PM

## 2024-04-16 NOTE — Telephone Encounter (Signed)
 Lendell - I clearly thought my scribble on the skin testing results was a mark out instead of a 2+. Totally my fault.  Let's make a third vial with dog and we can add to her pollen/dander vial when she needs a new vial.   Let's do only dog at the next visit for her shots and then she will be on the same dosing for all three vials after that.   Please apologize profusely for me.   Janet/Damita - can we mix pronto and get this vial to [facility]?   Marty Shaggy, MD [facility] 

## 2024-04-16 NOTE — Addendum Note (Signed)
 Addended by: IVA MARTY SALTNESS on: 04/16/2024 08:27 AM   Modules accepted: Orders

## 2024-04-17 DIAGNOSIS — J3081 Allergic rhinitis due to animal (cat) (dog) hair and dander: Secondary | ICD-10-CM | POA: Diagnosis not present

## 2024-04-17 NOTE — Progress Notes (Signed)
 Aeroallergen Immunotherapy  Ordering Provider: Dr. Marty Shaggy  Patient Details Name: Lauren [name] MRN: [id number] Date of Birth: 01-Nov-1950  Order 3 of 3  Vial Label: DOG  0.5 ml (Volume)  1:10 Concentration -- Dog Epithelia   0.5  ml Extract Subtotal 4.5  ml Diluent  5.0  ml Maintenance Total  Schedule:  B  Blue Vial (1:100,000): Schedule B (6 doses) Yellow Vial (1:10,000): Schedule B (6 doses) Green Vial (1:1,000): Schedule B (6 doses) Red Vial (1:100): Schedule A (12 doses)  Special Instructions: After completion of the first Red Vial, please space to every two weeks. After completion of the second Red Vial, please space to every 4 weeks. Ok to up dose new vials at 0.7mL --> 0.3 mL --> 0.5 mL. Ok to come twice weekly, if desired, as long as there is 48 hours between injections.

## 2024-04-17 NOTE — Progress Notes (Signed)
 VIALS MADE 04-17-24

## 2024-04-19 ENCOUNTER — Ambulatory Visit (INDEPENDENT_AMBULATORY_CARE_PROVIDER_SITE_OTHER)

## 2024-04-19 DIAGNOSIS — J309 Allergic rhinitis, unspecified: Secondary | ICD-10-CM | POA: Diagnosis not present

## 2024-04-19 MED ORDER — EPINEPHRINE 0.3 MG/0.3ML IJ SOAJ: 0.3000 mg | INTRAMUSCULAR | 1 refills | Status: AC | PRN

## 2024-04-22 ENCOUNTER — Other Ambulatory Visit: Payer: Self-pay | Admitting: *Deleted

## 2024-04-22 MED ORDER — FASENRA PEN 30 MG/ML ~~LOC~~ SOAJ: 30.0000 mg | SUBCUTANEOUS | 9 refills | Status: DC

## 2024-04-22 NOTE — Telephone Encounter (Signed)
 Tried to call patient to advised [place] approval for Fasenra  but unable to leave message due to voicemail not set up

## 2024-04-24 ENCOUNTER — Ambulatory Visit (INDEPENDENT_AMBULATORY_CARE_PROVIDER_SITE_OTHER)

## 2024-04-24 DIAGNOSIS — Z79899 Other long term (current) drug therapy: Secondary | ICD-10-CM | POA: Diagnosis not present

## 2024-04-24 DIAGNOSIS — J309 Allergic rhinitis, unspecified: Secondary | ICD-10-CM

## 2024-04-24 DIAGNOSIS — M1990 Unspecified osteoarthritis, unspecified site: Secondary | ICD-10-CM | POA: Diagnosis not present

## 2024-04-24 DIAGNOSIS — M1380 Other specified arthritis, unspecified site: Secondary | ICD-10-CM | POA: Diagnosis not present

## 2024-04-24 DIAGNOSIS — M797 Fibromyalgia: Secondary | ICD-10-CM | POA: Diagnosis not present

## 2024-04-24 DIAGNOSIS — M810 Age-related osteoporosis without current pathological fracture: Secondary | ICD-10-CM | POA: Diagnosis not present

## 2024-04-24 MED ORDER — FASENRA PEN 30 MG/ML ~~LOC~~ SOAJ: 30.0000 mg | SUBCUTANEOUS | 9 refills | Status: DC

## 2024-04-24 NOTE — Addendum Note (Signed)
 Addended by: OTHA MADELIN HERO on: 04/24/2024 08:17 AM   Modules accepted: Orders

## 2024-04-26 ENCOUNTER — Ambulatory Visit (INDEPENDENT_AMBULATORY_CARE_PROVIDER_SITE_OTHER)

## 2024-04-26 DIAGNOSIS — J309 Allergic rhinitis, unspecified: Secondary | ICD-10-CM

## 2024-04-30 ENCOUNTER — Ambulatory Visit (INDEPENDENT_AMBULATORY_CARE_PROVIDER_SITE_OTHER): Admitting: Psychiatry

## 2024-04-30 DIAGNOSIS — F339 Major depressive disorder, recurrent, unspecified: Secondary | ICD-10-CM | POA: Diagnosis not present

## 2024-04-30 DIAGNOSIS — F411 Generalized anxiety disorder: Secondary | ICD-10-CM
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
## 2024-05-01 ENCOUNTER — Other Ambulatory Visit: Payer: Self-pay | Admitting: Internal Medicine

## 2024-05-01 ENCOUNTER — Ambulatory Visit (INDEPENDENT_AMBULATORY_CARE_PROVIDER_SITE_OTHER)

## 2024-05-01 DIAGNOSIS — J309 Allergic rhinitis, unspecified: Secondary | ICD-10-CM | POA: Diagnosis not present

## 2024-05-03 ENCOUNTER — Ambulatory Visit (INDEPENDENT_AMBULATORY_CARE_PROVIDER_SITE_OTHER)

## 2024-05-03 ENCOUNTER — Encounter: Payer: Self-pay | Admitting: Internal Medicine

## 2024-05-03 DIAGNOSIS — J309 Allergic rhinitis, unspecified: Secondary | ICD-10-CM | POA: Diagnosis not present

## 2024-05-05 DIAGNOSIS — J4550 Severe persistent asthma, uncomplicated: Secondary | ICD-10-CM | POA: Diagnosis not present

## 2024-05-05 DIAGNOSIS — J4520 Mild intermittent asthma, uncomplicated: Secondary | ICD-10-CM | POA: Diagnosis not present

## 2024-05-05 DIAGNOSIS — E1169 Type 2 diabetes mellitus with other specified complication: Secondary | ICD-10-CM | POA: Diagnosis not present

## 2024-05-05 DIAGNOSIS — E7800 Pure hypercholesterolemia, unspecified: Secondary | ICD-10-CM | POA: Diagnosis not present

## 2024-05-08 DIAGNOSIS — K219 Gastro-esophageal reflux disease without esophagitis: Secondary | ICD-10-CM | POA: Diagnosis not present

## 2024-05-08 DIAGNOSIS — Z79899 Other long term (current) drug therapy: Secondary | ICD-10-CM | POA: Diagnosis not present

## 2024-05-08 DIAGNOSIS — M1380 Other specified arthritis, unspecified site: Secondary | ICD-10-CM | POA: Diagnosis not present

## 2024-05-08 DIAGNOSIS — M797 Fibromyalgia: Secondary | ICD-10-CM | POA: Diagnosis not present

## 2024-05-08 DIAGNOSIS — I10 Essential (primary) hypertension: Secondary | ICD-10-CM | POA: Diagnosis not present

## 2024-05-08 DIAGNOSIS — J4550 Severe persistent asthma, uncomplicated: Secondary | ICD-10-CM | POA: Diagnosis not present

## 2024-05-08 DIAGNOSIS — E1169 Type 2 diabetes mellitus with other specified complication: Secondary | ICD-10-CM | POA: Diagnosis not present

## 2024-05-08 DIAGNOSIS — E7800 Pure hypercholesterolemia, unspecified: Secondary | ICD-10-CM | POA: Diagnosis not present

## 2024-05-08 DIAGNOSIS — Z0000 Encounter for general adult medical examination without abnormal findings: Secondary | ICD-10-CM | POA: Diagnosis not present

## 2024-05-08 DIAGNOSIS — J302 Other seasonal allergic rhinitis: Secondary | ICD-10-CM | POA: Diagnosis not present

## 2024-05-09 DIAGNOSIS — Z79899 Other long term (current) drug therapy: Secondary | ICD-10-CM | POA: Diagnosis not present

## 2024-05-09 DIAGNOSIS — E1169 Type 2 diabetes mellitus with other specified complication: Secondary | ICD-10-CM | POA: Diagnosis not present

## 2024-05-09 DIAGNOSIS — I10 Essential (primary) hypertension: Secondary | ICD-10-CM | POA: Diagnosis not present

## 2024-05-09 DIAGNOSIS — Z23 Encounter for immunization: Secondary | ICD-10-CM | POA: Diagnosis not present

## 2024-05-09 DIAGNOSIS — M797 Fibromyalgia: Secondary | ICD-10-CM | POA: Diagnosis not present

## 2024-05-09 DIAGNOSIS — E7800 Pure hypercholesterolemia, unspecified: Secondary | ICD-10-CM | POA: Diagnosis not present

## 2024-05-14 ENCOUNTER — Ambulatory Visit (HOSPITAL_COMMUNITY): Admitting: Psychiatry

## 2024-05-14 DIAGNOSIS — F411 Generalized anxiety disorder: Secondary | ICD-10-CM

## 2024-05-14 DIAGNOSIS — F339 Major depressive disorder, recurrent, unspecified: Secondary | ICD-10-CM | POA: Diagnosis not present

## 2024-05-14 DIAGNOSIS — F332 Major depressive disorder, recurrent severe without psychotic features: Secondary | ICD-10-CM

## 2024-05-14 NOTE — Progress Notes (Unsigned)
 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  Work on inhaler technique:   Plan B = Backup (to supplement plan A, not to replace it) Only use your albuterol  inhaler as a rescue medication  Also  Ok to try albuterol  15 min before an activity (on alternating days)  that you know would usually make you short of breath Please schedule a follow up visit in 3 months but call sooner if needed - bring inhalers     11/14/2023  f/u ov/[facility]/Torian Thoennes re: cough variant asthma  maint on symbicort  80 started on 11/11/23  and dog now out of bedroom>>>  less saba use  Chief Complaint  Patient presents with   Follow-up    Follow up ,  Dyspnea:  food lion ok /  Cough: more than usual x 2 weeks  Sleeping: flat bed 2 pillows  with usual  noct cough  SABA use: not needing now  02: not  Rec Depomedrol 120 mg IM today  For itching/ sneezing / runny nose > zyrtec 10 mg at bedtime (can take during the day instead if doesn't make you sleepy Work on inhaler technique:   Please schedule a follow up visit in 6 months but call sooner if needed - bring your inhalers     05/15/2024  f/u ov/[facility]/Nickol Collister re: cough variant  asthma  maint on symbicort  80/ allergy  shots/ singulair   [facility]   Chief Complaint  Patient presents with   Asthma    Shob   Dyspnea:  better since cooler Cough: some am cough > white mucus only  Sleeping: flat bed 2 pillows   wakes up 3/7  nights due to drainage sensation  SABA use: not now  02: none     No obvious day to day or daytime variability or assoc   purulent sputum or mucus plugs or hemoptysis or cp or chest tightness, subjective wheeze or overt  hb symptoms.    Also denies any obvious fluctuation of symptoms with weather or environmental changes or other aggravating or alleviating factors except as outlined above   No unusual exposure hx or h/o childhood pna/ asthma or knowledge of premature birth.  Current Allergies, Complete Past Medical History, Past Surgical History, Family History, and Social History were reviewed in [employer] record.  ROS  The following are not active complaints unless bolded Hoarseness, sore throat/globus sensation, dysphagia, dental problems, itching sneezing ,  nasal congestion or sensation of discharge of excess mucus or purulent secretions, ear ache,  fever, chills, sweats, unintended wt loss or wt gain, classically pleuritic or exertional cp,  orthopnea pnd or arm/hand swelling  or leg swelling, presyncope, palpitations, abdominal pain, anorexia, nausea, vomiting, diarrhea  or change in bowel habits or change in bladder habits, change in stools or change in urine, dysuria, hematuria,  rash, arthralgias, visual complaints, headache, numbness, weakness or ataxia or problems with walking or coordination,  change in mood or  memory.        Current Meds  Medication Sig   acetaminophen  (TYLENOL ) 500 MG tablet Take 500 mg by mouth every 6 (six) hours as needed for moderate pain.   albuterol  (VENTOLIN  HFA) 108 (90 Base) MCG/ACT inhaler Inhale 2 puffs into the lungs every 4 (four) hours as needed for wheezing or shortness of  breath.   benralizumab  (FASENRA  PEN) 30 MG/ML prefilled autoinjector Inject 1 mL (30 mg total) into the skin every 28 (twenty-eight) days. For 3 doses then every 8 weeks   budesonide -formoterol  (SYMBICORT ) 80-4.5 MCG/ACT inhaler Take 2 puffs first thing in am and then another 2 puffs about 12 hours later.   CALCIUM  MAGNESIUM  ZINC PO Take 1 tablet by mouth daily.   Cholecalciferol (VITAMIN D -3) 25 MCG (1000 UT) CAPS Take 1,000 Units by mouth daily.   clobetasol (TEMOVATE) 0.05 % external solution Apply 1 Application topically 2 (two) times daily as needed.   cromolyn  (OPTICROM ) 4 % ophthalmic solution Place 2 drops into both eyes 4 (four) times daily.   [employer] Omega-3 Krill Oil 350 MG CAPS Take 350 mg by mouth daily.   Cyanocobalamin  (B-12 PO) Place 1 drop under the tongue daily.   dexlansoprazole  (DEXILANT ) 60 MG capsule TAKE 1 CAPSULE(60 MG) BY MOUTH DAILY   diclofenac  sodium (VOLTAREN ) 1 % GEL Apply 2 g topically 4 (four) times daily as needed (pain).   dicyclomine  (BENTYL ) 10 MG capsule Take 1 capsule (10 mg total) by mouth 4 (four) times daily -  before meals and at bedtime.   diphenoxylate -atropine  (LOMOTIL ) 2.5-0.025 MG tablet TAKE (1) TABLET BY MOUTH (3) TIMES DAILY AS NEEDED.   EPINEPHrine  (EPIPEN  2-PAK) 0.3 mg/0.3 mL IJ SOAJ injection Inject 0.3 mg into the muscle as needed for anaphylaxis.   fluticasone  (FLONASE ) 50 MCG/ACT nasal spray Place 2 sprays into both nostrils daily as needed (for a stuffy nose).   folic acid  (FOLVITE ) 1 MG tablet Take 1 mg by mouth daily.   gabapentin  (NEURONTIN ) 100 MG capsule Take 200-300 mg by mouth See admin instructions. Take 200mg  by mouth every day in the morning and take 300mg  by mouth at bedtime   Glucosamine-Chondroitin (GLUCOSAMINE CHONDR COMPLEX PO) Take 6 tablets by mouth daily. Glucosamine 1500mg  and Chondroitin sulfate 1200mg    lamoTRIgine  (LAMICTAL ) 100 MG tablet Take 1 tablet (100 mg total) by mouth 2 (two) times daily.   LORazepam  (ATIVAN ) 0.5 MG  tablet Take 0.5 tablets (0.25 mg total) by mouth daily as needed for anxiety.   losartan  (COZAAR ) 100 MG tablet Take 1 tablet (100 mg total) by mouth daily.   METHOTREXATE PO Take 6 tablets by mouth once a week.   metroNIDAZOLE  (METROGEL ) 1 % gel Apply 1 application  topically at bedtime.   montelukast  (SINGULAIR ) 10 MG tablet Take 1 tablet (10 mg total) by mouth at bedtime.   Multiple Vitamins-Minerals (HAIR SKIN AND NAILS FORMULA) TABS Take 2 tablets by mouth daily.   omega-3 acid ethyl esters (LOVAZA) 1 g capsule Take 1 capsule by mouth 2 (two) times daily.   ONETOUCH VERIO  test strip 1 each by Other route as needed.   rosuvastatin (CRESTOR) 20 MG tablet Take 20 mg by mouth daily.   venlafaxine  XR (EFFEXOR -XR) 150 MG 24 hr capsule TAKE 2 CAPSULES BY MOUTH EVERY DAY   vitamin E 400 UNIT capsule Take 400 Units daily by mouth.               Past Medical History:  Diagnosis Date   Anxiety    Chronic pain    COPD (chronic obstructive pulmonary disease) (HCC)    Depression    Diabetes mellitus without complication (HCC)    Fibromyalgia    Fibromyalgia    GERD (gastroesophageal reflux disease)    Headache    Hepatitis C    History of blood transfusion    History of bronchitis    IBS (irritable bowel syndrome)    Osteoarthritis    Pneumonia    Prediabetes    Shortness of breath dyspnea    allergies; increased pain;    Vertigo    Wears glasses       Objective:    Wts  05/15/2024        153  11/14/2023        155   08/09/23 153 lb 4 oz (69.5 kg)  08/08/23 153 lb (69.4 kg)  05/26/23 149 lb 6.4 oz (67.8 kg)    Vital signs reviewed  05/15/2024  - Note at rest 02 sats  96% on RA   General appearance:    amb wf/ dry coughing fits onset with inspiration on laughing     HEENT : Oropharynx  clear - no cobblestoning or pnds      Nasal turbinates nl    NECK :  without  apparent JVD/ palpable Nodes/TM    LUNGS: no acc muscle use,  Nl contour chest which is clear to A and P  bilaterally     CV:  RRR  no s3 or murmur or increase in P2, and no edema   ABD:  soft and nontender   MS:  Gait nl   ext warm without deformities Or obvious joint restrictions  calf tenderness, cyanosis or clubbing    SKIN: warm and dry without lesions    NEURO:  alert, approp, nl sensorium with  no motor or cerebellar deficits apparent.          I personally reviewed images and agree with radiology impression as follows:   Chest CT coronary    02/08/24  lung cuts with Biapical pleuroparenchymal scarring o/w nl    Assessment   Assessment & Plan Cough variant asthma Onset in her 58s with assoc rhinitis/ dog allergy  (still has one) - Spirometry  04/12/23 FEV1 1.87 (83%)  Ratio 0.84   - 08/08/2023    try symbicort  80  2bid > did not start until 11/11/23 - 11/14/2023  After extensive coaching inhaler device,  effectiveness =    60% from a baseline of 30%  - started allergy  shots Aug 2025   - 05/15/2024  After extensive coaching inhaler device,  effectiveness =    80% hfa from baseline of 50% short ti  - 05/15/2024 add h2 and h1 at hs   Some of her symptoms are more suggestive of UACS than asthma  Upper airway cough syndrome (previously labeled PNDS),  is so named because it's frequently impossible to sort out how much is  CR/sinusitis with freq throat clearing (which can be related to primary GERD)   vs  causing  secondary ( extra esophageal)  GERD from wide swings in gastric pressure that occur with throat clearing, often  promoting self use of mint and menthol  lozenges that reduce the lower esophageal sphincter tone and exacerbate the problem further in a cyclical fashion.   These are the same pts (now being labeled as having irritable larynx syndrome by some cough centers) who not infrequently have a history of having failed to tolerate ace inhibitors,  dry powder inhalers(trelegy esp high doses so using symbicort  in low doses)  or biphosphonates or report having  atypical/extraesophageal reflux(LPR)  symptoms that don't respond to standard doses of PPI  and are easily confused as having aecopd or asthma flares by even experienced allergists/ pulmonologists (myself included).   >>>> rec add h1 and h2 blockers q pm and use hard rock candy to suppress urge to clear throat  - see avs for instructions unique to this ov - next step may be adding low doses of gabapentin   F/u in 6 weeks with all meds in hand using a trust but verify approach to confirm accurate Medication  Reconciliation The principal here is that until we are certain that the  patients are doing what we've asked, it makes no sense to ask them to do more.          Each maintenance medication was reviewed in detail including emphasizing most importantly the difference between maintenance and prns and under what circumstances the prns are to be triggered using an action plan format where appropriate.  Total time for H and P, chart review, counseling, reviewing hfa  device(s) and generating customized AVS unique to this office visit / same day charting = 34 min          AVS  Patient Instructions  Work on inhaler technique:  relax and gently blow all the way out then take a nice smooth full deep breath back in, triggering the inhaler at same time you start breathing in.  Hold breath in for at least  5 seconds if you can. Blow out symbort  thru nose. Rinse and gargle with water  when done.  If mouth or throat bother you at all,  try brushing teeth/gums/tongue with arm and hammer toothpaste/ make a slurry and gargle and spit out.    Pepcid  20 (famotidine ) mg one hour before bedtime   Use ludens (with PECTIN only )  cough drops are ok)  -  no mint or menthol  or chocolate products   For drainage / throat tickle try take CHLORPHENIRAMINE  4 mg  start with just a dose or two an hour before bedtime and see how you tolerate it before trying in daytime.    Please schedule a follow up office visit in 6  weeks, call sooner if needed - bring inhalers            Ozell America, MD 05/18/2024
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
## 2024-05-14 NOTE — Progress Notes (Signed)
 Lauren [name]

## 2024-05-15 ENCOUNTER — Ambulatory Visit (INDEPENDENT_AMBULATORY_CARE_PROVIDER_SITE_OTHER)

## 2024-05-15 ENCOUNTER — Ambulatory Visit: Admitting: Internal Medicine

## 2024-05-15 ENCOUNTER — Encounter: Payer: Self-pay | Admitting: Internal Medicine

## 2024-05-15 VITALS — BP 132/73 | HR 100 | Ht 64.0 in | Wt 153.0 lb

## 2024-05-15 DIAGNOSIS — J309 Allergic rhinitis, unspecified: Secondary | ICD-10-CM

## 2024-05-15 DIAGNOSIS — J45991 Cough variant asthma: Secondary | ICD-10-CM

## 2024-05-15 DIAGNOSIS — Z87891 Personal history of nicotine dependence: Secondary | ICD-10-CM

## 2024-05-15 NOTE — Patient Instructions (Addendum)
 Work on inhaler technique:  relax and gently blow all the way out then take a nice smooth full deep breath back in, triggering the inhaler at same time you start breathing in.  Hold breath in for at least  5 seconds if you can. Blow out symbort  thru nose. Rinse and gargle with water  when done.  If mouth or throat bother you at all,  try brushing teeth/gums/tongue with arm and hammer toothpaste/ make a slurry and gargle and spit out.    Pepcid  20 (famotidine ) mg one hour before bedtime   Use ludens (with PECTIN only )  cough drops are ok)  -  no mint or menthol  or chocolate products   For drainage / throat tickle try take CHLORPHENIRAMINE  4 mg  start with just a dose or two an hour before bedtime and see how you tolerate it before trying in daytime.    Please schedule a follow up office visit in 6 weeks, call sooner if needed - bring inhalers

## 2024-05-15 NOTE — Assessment & Plan Note (Signed)
 Onset in her 8s with assoc rhinitis/ dog allergy  (still has one) - Spirometry  04/12/23 FEV1 1.87 (83%)  Ratio 0.84   - 08/08/2023    try symbicort  80  2bid > did not start until 11/11/23 - 11/14/2023  After extensive coaching inhaler device,  effectiveness =    60% from a baseline of 30%  - started allergy  shots Aug 2025  - 05/15/2024  After extensive coaching inhaler device,  effectiveness =    80% hfa from baseline of 50% short ti  - 05/15/2024 add h2 and h1 at hs

## 2024-05-17 ENCOUNTER — Ambulatory Visit (INDEPENDENT_AMBULATORY_CARE_PROVIDER_SITE_OTHER)

## 2024-05-17 DIAGNOSIS — J309 Allergic rhinitis, unspecified: Secondary | ICD-10-CM | POA: Diagnosis not present

## 2024-05-20 DIAGNOSIS — L718 Other rosacea: Secondary | ICD-10-CM | POA: Diagnosis not present

## 2024-05-20 DIAGNOSIS — Z1283 Encounter for screening for malignant neoplasm of skin: Secondary | ICD-10-CM | POA: Diagnosis not present

## 2024-05-20 DIAGNOSIS — L218 Other seborrheic dermatitis: Secondary | ICD-10-CM | POA: Diagnosis not present

## 2024-05-20 DIAGNOSIS — D225 Melanocytic nevi of trunk: Secondary | ICD-10-CM | POA: Diagnosis not present

## 2024-05-22 ENCOUNTER — Ambulatory Visit: Admitting: Allergy & Immunology

## 2024-05-22 DIAGNOSIS — E119 Type 2 diabetes mellitus without complications: Secondary | ICD-10-CM | POA: Diagnosis not present

## 2024-05-22 DIAGNOSIS — H40013 Open angle with borderline findings, low risk, bilateral: Secondary | ICD-10-CM | POA: Diagnosis not present

## 2024-05-22 DIAGNOSIS — Z961 Presence of intraocular lens: Secondary | ICD-10-CM | POA: Diagnosis not present

## 2024-05-24 ENCOUNTER — Ambulatory Visit (INDEPENDENT_AMBULATORY_CARE_PROVIDER_SITE_OTHER): Payer: Self-pay

## 2024-05-24 DIAGNOSIS — J309 Allergic rhinitis, unspecified: Secondary | ICD-10-CM

## 2024-05-28 ENCOUNTER — Ambulatory Visit (HOSPITAL_COMMUNITY): Admitting: Psychiatry

## 2024-05-28 DIAGNOSIS — F411 Generalized anxiety disorder: Secondary | ICD-10-CM

## 2024-05-28 DIAGNOSIS — F332 Major depressive disorder, recurrent severe without psychotic features: Secondary | ICD-10-CM | POA: Diagnosis not present
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
## 2024-05-29 ENCOUNTER — Ambulatory Visit (INDEPENDENT_AMBULATORY_CARE_PROVIDER_SITE_OTHER)

## 2024-05-29 DIAGNOSIS — J309 Allergic rhinitis, unspecified: Secondary | ICD-10-CM | POA: Diagnosis not present

## 2024-05-31 ENCOUNTER — Ambulatory Visit (INDEPENDENT_AMBULATORY_CARE_PROVIDER_SITE_OTHER): Payer: Self-pay

## 2024-05-31 DIAGNOSIS — J309 Allergic rhinitis, unspecified: Secondary | ICD-10-CM | POA: Diagnosis not present

## 2024-06-04 DIAGNOSIS — J4520 Mild intermittent asthma, uncomplicated: Secondary | ICD-10-CM | POA: Diagnosis not present

## 2024-06-04 DIAGNOSIS — E7800 Pure hypercholesterolemia, unspecified: Secondary | ICD-10-CM | POA: Diagnosis not present

## 2024-06-04 DIAGNOSIS — E1169 Type 2 diabetes mellitus with other specified complication: Secondary | ICD-10-CM | POA: Diagnosis not present

## 2024-06-04 DIAGNOSIS — J4550 Severe persistent asthma, uncomplicated: Secondary | ICD-10-CM | POA: Diagnosis not present

## 2024-06-05 ENCOUNTER — Ambulatory Visit: Admitting: Allergy & Immunology

## 2024-06-10 NOTE — Patient Instructions (Incomplete)
 Allergic rhinitis Continue allergen avoidance measures directed toward indoor molds, outdoor molds, dog, and cat as listed below Continue azelastine nasal spray 2 sprays in each nostril up to twice a day as needed for a runny nose or sneezing Continue Flonase 2 sprays in each nostril once a day as needed for a stuffy nose.  In the right nostril, point the applicator out toward the right ear. In the left nostril, point the applicator out toward the left ear Consider saline nasal rinses as needed for nasal symptoms. Use this before any medicated nasal sprays for best result  Asthma/COPD overlap Continue montelukast 10 mg once a day to prevent cough or wheeze Continue Trelegy 200-1 puff once a day to prevent cough or wheeze Continue albuterol 2 puffs once every 4 hours as needed for cough or wheeze You may use albuterol 2 puffs 15 minutes before activity to decrease cough or wheeze  Reflux Continue dietary and lifestyle modifications as listed below  Call the clinic if this treatment plan is not working well for you.  Follow up in 4 months or sooner if needed.  Control of Mold Allergen Mold and fungi can grow on a variety of surfaces provided certain temperature and moisture conditions exist.  Outdoor molds grow on plants, decaying vegetation and soil.  The major outdoor mold, Alternaria and Cladosporium, are found in very high numbers during hot and dry conditions.  Generally, a late Summer - Fall peak is seen for common outdoor fungal spores.  Rain will temporarily lower outdoor mold spore count, but counts rise rapidly when the rainy period ends.  The most important indoor molds are Aspergillus and Penicillium.  Dark, humid and poorly ventilated basements are ideal sites for mold growth.  The next most common sites of mold growth are the bathroom and the kitchen.  Outdoor [employer] Use air conditioning and keep windows closed Avoid exposure to decaying vegetation. Avoid leaf  raking. Avoid grain handling. Consider wearing a face mask if working in moldy areas.  Indoor Mold Control Maintain humidity below 50%. Clean washable surfaces with 5% bleach solution. Remove sources e.g. Contaminated carpets.  Control of Dog or Cat Allergen Avoidance is the best way to manage a dog or cat allergy. If you have a dog or cat and are allergic to dog or cats, consider removing the dog or cat from the home. If you have a dog or cat but don't want to find it a new home, or if your family wants a pet even though someone in the household is allergic, here are some strategies that may help keep symptoms at bay:  Keep the pet out of your bedroom and restrict it to only a few rooms. Be advised that keeping the dog or cat in only one room will not limit the allergens to that room. Don't pet, hug or kiss the dog or cat; if you do, wash your hands with soap and water. High-efficiency particulate air (HEPA) cleaners run continuously in a bedroom or living room can reduce allergen levels over time. Regular use of a high-efficiency vacuum cleaner or a central vacuum can reduce allergen levels. Giving your dog or cat a bath at least once a week can reduce airborne allergen.

## 2024-06-10 NOTE — Progress Notes (Signed)
 [address] [place] [address] Dept: [phone]  FOLLOW UP NOTE  Patient ID: Lauren [name], female    DOB: [birth year]  Age: 73 y.o. MRN: [id number] Date of Office Visit: 06/12/2024  Assessment  Chief Complaint: Allergic Rhinitis  (Pt states allergy  shots have been helping. She was sick last [name] and said she had a virus. She Lauren [name] due to virus and has noticed an increase of allergy  symptoms but over all since last visit allergy  symptoms have improved/ remained stable. ) and Asthma (Pt states she has not had any symptoms or flare ups. She is not having issues breathing and is starting to enjoy the cooler weather. )  HPI [name] is a 73 year old female who presents to the clinic for follow-up visit.  She was last seen in this clinic on 02/23/2024 by Dr. Iva for evaluation of asthma COPD overlap on Fasenra , allergic rhinitis, allergic conjunctivitis, and reflux.  Discussed the use of AI scribe software for clinical note transcription with the patient, who gave verbal consent to proceed.  History of Present Illness Lauren [name] is a 73 year old female with asthma and allergies who presents for follow-up on her allergy  and asthma management.  She takes Symbicort  twice daily, albuterol  as needed, and montelukast  for her asthma. She experiences increased use of albuterol  on humid days. She Lauren her allergy  shots for a [name], leading to increased sensitivity to allergens, especially when outdoors or around her dog. She has not yet received her Fasenra  injections due to a delay in receiving the medication.  She has had a cough for the past [name], which she attributes to her gastric reflux. The cough is exacerbated by eating chocolate. Occasionally, the cough produces a small amount of sputum in the morning. She uses Dexilant  for her reflux control, which she finds effective, and takes it as prescribed by her  gastroenterologist.  Her allergy  symptoms include a runny, stuffy nose, sneezing, and itchy eyes. She manages these symptoms with allergen immunotherapy, cromolyn  eye drops, Flonase , and Astelin  nasal sprays, along with saline rinses. She recently received a COVID-19 vaccine and a flu shot. She wears a mask in public settings, especially around children, to avoid illness. She recounts a recent stomach virus that did not affect her breathing.  Her current medications are listed in the chart.    Drug Allergies:  Allergies  Allergen Reactions   Elavil  [Amitriptyline ] Other (See Comments)    Vivid dreams and almost suicidal   Flagyl  [Metronidazole ] Itching   Vistaril  [Hydroxyzine  Hcl] Other (See Comments)    Got higher than a kite on too much of this.   Geodon [Ziprasidone Hydrochloride] Other (See Comments)    Blacked out   Lactose Intolerance (Gi) Other (See Comments)    GI upset   Latex Other (See Comments)    Red at site   Other Other (See Comments)    Physical Exam: BP 118/70 (BP Location: Left Arm, Patient Position: Sitting, Cuff Size: Normal)   Pulse 92   Temp (!) 97.3 F (36.3 C) (Temporal)   Wt 153 lb (69.4 kg)   SpO2 95%   BMI 26.26 kg/m    Physical Exam Vitals reviewed.  Constitutional:      Appearance: Normal appearance.  HENT:     Head: Normocephalic and atraumatic.     Right Ear: Tympanic membrane normal.     Left Ear: Ear canal normal.     Nose:  Comments: Bilateral nares slightly erythematous with thin clear nasal drainage noted. Pharynx normal. Ears normal. Eyes normal.    Mouth/Throat:     Pharynx: Oropharynx is clear.  Eyes:     Conjunctiva/sclera: Conjunctivae normal.  Cardiovascular:     Rate and Rhythm: Normal rate and regular rhythm.     Heart sounds: Normal heart sounds. No murmur heard. Pulmonary:     Effort: Pulmonary effort is normal.     Breath sounds: Normal breath sounds.     Comments: Lungs clear to auscultation Musculoskeletal:         General: Normal range of motion.     Cervical back: Normal range of motion and neck supple.  Skin:    General: Skin is warm and dry.  Neurological:     Mental Status: She is alert and oriented to person, place, and time.  Psychiatric:        Mood and Affect: Mood normal.        Behavior: Behavior normal.        Thought Content: Thought content normal.        Judgment: Judgment normal.      Assessment and Plan: 1. Seasonal and perennial allergic rhinitis   2. Asthma-COPD overlap syndrome (HCC)   3. Gastroesophageal reflux disease, unspecified whether esophagitis present   4. Seasonal allergic conjunctivitis     No orders of the defined types were placed in this encounter.   Patient Instructions   Allergic rhinitis Continue allergen avoidance measures directed toward indoor molds, outdoor molds, dog, and cat as listed below Continue azelastine  nasal spray 2 sprays in each nostril up to twice a day as needed for a runny nose or sneezing Continue Flonase  2 sprays in each nostril once a day as needed for a stuffy nose.  In the right nostril, point the applicator out toward the right ear. In the left nostril, point the applicator out toward the left ear Consider saline nasal rinses as needed for nasal symptoms. Use this before any medicated nasal sprays for best result We will submit to your insurance company for Fasenra  to control your asthma. You will next hear from our Fasenra  coordinator, Tammy, with next steps for Fasenra   Asthma/COPD overlap Continue Symbicort  80-2 puffs twice a day with a spacer to prevent cough or wheeze.  Call the clinic if your asthma is not well-controlled with Symbicort  Continue montelukast  10 mg once a day to prevent cough or wheeze Continue albuterol  2 puffs once every 4 hours as needed for cough or wheeze You may use albuterol  2 puffs 15 minutes before activity to decrease cough or wheeze  Reflux Continue dietary and lifestyle modifications as  listed below Continue to follow-up with your gastrointestinal specialist as recommended  Allergic conjunctivitis Continue Cromolyn  eye drops 2 drops in each eye up to 4 times a day.  Some over the counter eye drops include Pataday one drop in each eye once a day as needed for red, itchy eyes OR Zaditor one drop in each eye twice a day as needed for red itchy eyes. Avoid eye drops that say red eye relief as they may contain medications that dry out your eyes.   Call the clinic if this treatment plan is not working well for you.  Follow up in 4 months or sooner if needed.  Return in about 4 months (around 10/13/2024), or if symptoms worsen or fail to improve.    Thank you for the opportunity to care for this patient.  Please do not hesitate to contact me with questions.  Arlean Mutter, FNP [facility] 

## 2024-06-11 ENCOUNTER — Ambulatory Visit (HOSPITAL_COMMUNITY): Admitting: Psychiatry

## 2024-06-12 ENCOUNTER — Encounter: Payer: Self-pay | Admitting: Family Medicine

## 2024-06-12 ENCOUNTER — Ambulatory Visit: Admitting: Family Medicine

## 2024-06-12 VITALS — BP 118/70 | HR 92 | Temp 97.3°F | Wt 153.0 lb

## 2024-06-12 DIAGNOSIS — J3089 Other allergic rhinitis: Secondary | ICD-10-CM

## 2024-06-12 DIAGNOSIS — H1010 Acute atopic conjunctivitis, unspecified eye: Secondary | ICD-10-CM

## 2024-06-12 DIAGNOSIS — K219 Gastro-esophageal reflux disease without esophagitis: Secondary | ICD-10-CM

## 2024-06-12 DIAGNOSIS — J4489 Other specified chronic obstructive pulmonary disease: Secondary | ICD-10-CM | POA: Diagnosis not present

## 2024-06-12 DIAGNOSIS — H1013 Acute atopic conjunctivitis, bilateral: Secondary | ICD-10-CM | POA: Diagnosis not present

## 2024-06-12 DIAGNOSIS — J302 Other seasonal allergic rhinitis: Secondary | ICD-10-CM | POA: Diagnosis not present

## 2024-06-14 ENCOUNTER — Ambulatory Visit (INDEPENDENT_AMBULATORY_CARE_PROVIDER_SITE_OTHER): Payer: Self-pay

## 2024-06-14 DIAGNOSIS — J309 Allergic rhinitis, unspecified: Secondary | ICD-10-CM

## 2024-06-18 ENCOUNTER — Other Ambulatory Visit: Payer: Self-pay | Admitting: *Deleted

## 2024-06-18 MED ORDER — FASENRA PEN 30 MG/ML ~~LOC~~ SOAJ: 30.0000 mg | SUBCUTANEOUS | 9 refills | Status: DC

## 2024-06-19 ENCOUNTER — Encounter (INDEPENDENT_AMBULATORY_CARE_PROVIDER_SITE_OTHER): Payer: Self-pay | Admitting: Gastroenterology

## 2024-06-19 ENCOUNTER — Ambulatory Visit (INDEPENDENT_AMBULATORY_CARE_PROVIDER_SITE_OTHER)

## 2024-06-19 DIAGNOSIS — J309 Allergic rhinitis, unspecified: Secondary | ICD-10-CM

## 2024-06-21 ENCOUNTER — Ambulatory Visit

## 2024-06-21 DIAGNOSIS — J309 Allergic rhinitis, unspecified: Secondary | ICD-10-CM

## 2024-06-26 ENCOUNTER — Ambulatory Visit (INDEPENDENT_AMBULATORY_CARE_PROVIDER_SITE_OTHER)

## 2024-06-26 DIAGNOSIS — J309 Allergic rhinitis, unspecified: Secondary | ICD-10-CM

## 2024-07-03 ENCOUNTER — Ambulatory Visit (INDEPENDENT_AMBULATORY_CARE_PROVIDER_SITE_OTHER)

## 2024-07-03 DIAGNOSIS — J4489 Other specified chronic obstructive pulmonary disease: Secondary | ICD-10-CM | POA: Diagnosis not present

## 2024-07-03 DIAGNOSIS — E7800 Pure hypercholesterolemia, unspecified: Secondary | ICD-10-CM | POA: Diagnosis not present

## 2024-07-03 DIAGNOSIS — I10 Essential (primary) hypertension: Secondary | ICD-10-CM | POA: Diagnosis not present

## 2024-07-03 DIAGNOSIS — Z79899 Other long term (current) drug therapy: Secondary | ICD-10-CM | POA: Diagnosis not present

## 2024-07-03 DIAGNOSIS — J309 Allergic rhinitis, unspecified: Secondary | ICD-10-CM

## 2024-07-03 DIAGNOSIS — R6889 Other general symptoms and signs: Secondary | ICD-10-CM | POA: Diagnosis not present

## 2024-07-03 DIAGNOSIS — K219 Gastro-esophageal reflux disease without esophagitis: Secondary | ICD-10-CM | POA: Diagnosis not present

## 2024-07-03 DIAGNOSIS — E1169 Type 2 diabetes mellitus with other specified complication: Secondary | ICD-10-CM | POA: Diagnosis not present

## 2024-07-03 DIAGNOSIS — J302 Other seasonal allergic rhinitis: Secondary | ICD-10-CM | POA: Diagnosis not present

## 2024-07-05 ENCOUNTER — Ambulatory Visit (INDEPENDENT_AMBULATORY_CARE_PROVIDER_SITE_OTHER): Payer: Self-pay

## 2024-07-05 DIAGNOSIS — J309 Allergic rhinitis, unspecified: Secondary | ICD-10-CM | POA: Diagnosis not present

## 2024-07-06 ENCOUNTER — Other Ambulatory Visit (HOSPITAL_COMMUNITY): Payer: Self-pay | Admitting: Psychiatry

## 2024-07-08 ENCOUNTER — Telehealth: Payer: Self-pay | Admitting: Internal Medicine

## 2024-07-08 NOTE — Telephone Encounter (Signed)
Called to schedule follow up no answer left vm

## 2024-07-08 NOTE — Telephone Encounter (Signed)
 Call for appt

## 2024-07-08 NOTE — Telephone Encounter (Signed)
 Returned the pt's call and LMOVM regarding seeing her PCP because she has not been seen in over a year and it may not be GI related. Will wait to see if pt calls back

## 2024-07-08 NOTE — Telephone Encounter (Signed)
 Patient called the office to schedule a recall appointment.  She said she is out of the diarrhea medication and needs to get a refill.  She said that [employer] have any.  She said she had a rough weekend without it

## 2024-07-10 ENCOUNTER — Ambulatory Visit (INDEPENDENT_AMBULATORY_CARE_PROVIDER_SITE_OTHER)

## 2024-07-10 DIAGNOSIS — J309 Allergic rhinitis, unspecified: Secondary | ICD-10-CM

## 2024-07-10 DIAGNOSIS — J3089 Other allergic rhinitis: Secondary | ICD-10-CM

## 2024-07-11 ENCOUNTER — Telehealth: Payer: Self-pay | Admitting: *Deleted

## 2024-07-11 NOTE — Telephone Encounter (Signed)
-----   Message from Arlean Mutter sent at 06/12/2024 10:02 PM EDT ----- Hi there Lauren [name], This patient is interested in starting Fasenra . I saw that you were unable to reach her by phone. Is she still eligible for Fasenra  for asthma control? Thank you

## 2024-07-11 NOTE — Telephone Encounter (Signed)
 Called patient and explained issue with her PAP approval and wrong dob trying to fix after couple weeks got same done and delivery today scheduled for inj on Monday

## 2024-07-15 ENCOUNTER — Ambulatory Visit

## 2024-07-15 DIAGNOSIS — J4489 Other specified chronic obstructive pulmonary disease: Secondary | ICD-10-CM

## 2024-07-15 MED ORDER — BENRALIZUMAB 30 MG/ML ~~LOC~~ SOSY
30.0000 mg | PREFILLED_SYRINGE | SUBCUTANEOUS | Status: AC
  Administered 2024-07-15 – 2024-09-13 (×3): 30 mg via SUBCUTANEOUS

## 2024-07-15 NOTE — Progress Notes (Signed)
 Immunotherapy   Patient Details  Name: Lauren [name] MRN: [id number] Date of Birth: 1950/11/15  07/15/2024  Lauren [name] started Fasenra  injections. Patient waited 15 minutes after administration with no issues.  Frequency:every 28 days X 3 doses then every 8 weeks after   Consent signed and patient instructions given.   Lauren [name] 07/15/2024, 2:24 PM

## 2024-07-17 ENCOUNTER — Ambulatory Visit (INDEPENDENT_AMBULATORY_CARE_PROVIDER_SITE_OTHER)

## 2024-07-17 DIAGNOSIS — J3089 Other allergic rhinitis: Secondary | ICD-10-CM

## 2024-07-17 DIAGNOSIS — J309 Allergic rhinitis, unspecified: Secondary | ICD-10-CM

## 2024-07-19 ENCOUNTER — Other Ambulatory Visit (HOSPITAL_COMMUNITY): Payer: Self-pay | Admitting: Psychiatry

## 2024-07-19 ENCOUNTER — Ambulatory Visit (INDEPENDENT_AMBULATORY_CARE_PROVIDER_SITE_OTHER)

## 2024-07-19 DIAGNOSIS — J3089 Other allergic rhinitis: Secondary | ICD-10-CM

## 2024-07-19 DIAGNOSIS — J309 Allergic rhinitis, unspecified: Secondary | ICD-10-CM

## 2024-07-19 NOTE — Telephone Encounter (Signed)
 Call for appt

## 2024-07-24 ENCOUNTER — Ambulatory Visit (INDEPENDENT_AMBULATORY_CARE_PROVIDER_SITE_OTHER)

## 2024-07-24 DIAGNOSIS — J302 Other seasonal allergic rhinitis: Secondary | ICD-10-CM | POA: Diagnosis not present

## 2024-07-24 DIAGNOSIS — J309 Allergic rhinitis, unspecified: Secondary | ICD-10-CM | POA: Diagnosis not present

## 2024-07-30 ENCOUNTER — Ambulatory Visit (INDEPENDENT_AMBULATORY_CARE_PROVIDER_SITE_OTHER): Admitting: Psychiatry

## 2024-07-30 DIAGNOSIS — F339 Major depressive disorder, recurrent, unspecified: Secondary | ICD-10-CM | POA: Diagnosis not present

## 2024-07-30 DIAGNOSIS — F411 Generalized anxiety disorder: Secondary | ICD-10-CM
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
## 2024-07-31 ENCOUNTER — Ambulatory Visit (INDEPENDENT_AMBULATORY_CARE_PROVIDER_SITE_OTHER)

## 2024-07-31 ENCOUNTER — Ambulatory Visit (HOSPITAL_COMMUNITY): Admitting: Psychiatry

## 2024-07-31 DIAGNOSIS — J3089 Other allergic rhinitis: Secondary | ICD-10-CM

## 2024-07-31 DIAGNOSIS — J309 Allergic rhinitis, unspecified: Secondary | ICD-10-CM

## 2024-08-07 ENCOUNTER — Ambulatory Visit

## 2024-08-07 DIAGNOSIS — J309 Allergic rhinitis, unspecified: Secondary | ICD-10-CM

## 2024-08-07 DIAGNOSIS — J302 Other seasonal allergic rhinitis: Secondary | ICD-10-CM | POA: Diagnosis not present

## 2024-08-08 ENCOUNTER — Other Ambulatory Visit (HOSPITAL_COMMUNITY): Payer: Self-pay | Admitting: Psychiatry

## 2024-08-08 NOTE — Telephone Encounter (Signed)
 Call for appt

## 2024-08-12 ENCOUNTER — Ambulatory Visit

## 2024-08-13 ENCOUNTER — Ambulatory Visit (HOSPITAL_COMMUNITY): Admitting: Psychiatry

## 2024-08-13 DIAGNOSIS — F411 Generalized anxiety disorder: Secondary | ICD-10-CM

## 2024-08-13 DIAGNOSIS — F332 Major depressive disorder, recurrent severe without psychotic features: Secondary | ICD-10-CM
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
## 2024-08-14 ENCOUNTER — Ambulatory Visit (INDEPENDENT_AMBULATORY_CARE_PROVIDER_SITE_OTHER)

## 2024-08-14 DIAGNOSIS — J302 Other seasonal allergic rhinitis: Secondary | ICD-10-CM | POA: Diagnosis not present

## 2024-08-14 DIAGNOSIS — J309 Allergic rhinitis, unspecified: Secondary | ICD-10-CM

## 2024-08-15 ENCOUNTER — Ambulatory Visit: Admitting: Gastroenterology

## 2024-08-15 ENCOUNTER — Encounter: Payer: Self-pay | Admitting: Gastroenterology

## 2024-08-15 VITALS — BP 132/66 | HR 98 | Temp 97.6°F | Ht 65.0 in | Wt 155.0 lb

## 2024-08-15 DIAGNOSIS — R151 Fecal smearing: Secondary | ICD-10-CM | POA: Diagnosis not present

## 2024-08-15 DIAGNOSIS — K219 Gastro-esophageal reflux disease without esophagitis: Secondary | ICD-10-CM | POA: Diagnosis not present

## 2024-08-15 DIAGNOSIS — K582 Mixed irritable bowel syndrome: Secondary | ICD-10-CM | POA: Diagnosis not present

## 2024-08-15 NOTE — Progress Notes (Signed)
 GI Office Note    Referring Provider: Rolinda Millman, MD Primary Care Physician:  Rolinda Millman, MD Primary Gastroenterologist: Carlin POUR. Cindie, DO  Date:  08/15/2024  ID:  Lauren [name], DOB 21-Jun-1951, MRN [id number]  Chief Complaint   Chief Complaint  Patient presents with   Follow-up    Follow up. No problems    History of Present Illness  Lauren [name] is a 73 y.o. female with a history of IBS-D, GERD, hepatitis C s/p treatment with Harvoni presenting today for follow-up without any complaints.  EGD January 2022: - Small hiatal hernia - Finding suspicious for Barrett's esophagus however biopsies negative - Duodenal biopsies negative - Empiric esophageal dilation performed  Colonoscopy November 2023 - 15 mm cecal polyp (sessile serrated) -Random colonic biopsies negative for microscopic colitis - Recommended follow-up in 3 years  Last office visit September 2024.  IBS-D noted be well-controlled with dicyclomine , taking 1-2 doses daily and doing well.  If she eats something that she knows usually causes diarrhea she will take an extra dose.  Only uses Lomotil  as needed.  Denied any melena or BRBPR.  Dexilant  60 mg daily controlling reflux well without nausea, vomiting, or dysphagia.  Only has discomfort if she overeats.  Denies continued excellent up to 4 times daily and Dexilant  60 mg daily along with Lomotil  as needed follow-up in 1 year or sooner if needed.  Today:  Discussed the use of AI scribe software for clinical note transcription with the patient, who gave verbal consent to proceed.  She experiences intermittent diarrhea and bowel irregularities, including episodes of mushy stools and occasional fecal incontinence. She follows a FODMAP diet and has identified lactose intolerance and peanuts as triggers. She uses almond milk and avoids sugar substitutes like xylitol and stevia, which she suspects may contribute to her symptoms. Stress and certain foods,  including processed foods and nuts, exacerbate her diarrhea.  She has irregular bowel movements, with hard little balls and mushy stools. She sometimes feels like she has emptied her bowels completely, but later finds stool in her underwear without feeling it. She wears a pad to manage these episodes. She takes probiotics and Metamucil for fiber, and she eats whole grain bread and fruits. She is concerned about the inconsistency of her bowel movements and the impact on her daily life.  She manages diabetes and asthma alongside her bowel issues. She is cautious about her immune system due to these conditions, which makes her wary of COVID-19 exposure. She takes allergy  shots and antihistamines for allergies, including dust, dander, trees, and weeds.  She has a family history of various health issues, mentioning that she has inherited 'the worst' from both her parents. She has had polyps discovered during a previous colonoscopy.      Wt Readings from Last 6 Encounters:  08/15/24 155 lb (70.3 kg)  06/12/24 153 lb (69.4 kg)  05/15/24 153 lb (69.4 kg)  02/08/24 154 lb (69.9 kg)  01/03/24 154 lb (69.9 kg)  11/14/23 155 lb 3.2 oz (70.4 kg)   Body mass index is 25.79 kg/m.  Current Outpatient Medications  Medication Sig Dispense Refill   acetaminophen  (TYLENOL ) 500 MG tablet Take 500 mg by mouth every 6 (six) hours as needed for moderate pain.     albuterol  (VENTOLIN  HFA) 108 (90 Base) MCG/ACT inhaler Inhale 2 puffs into the lungs every 4 (four) hours as needed for wheezing or shortness of breath. 8.5 g 3   benralizumab  (FASENRA  PEN) 30 MG/ML  prefilled autoinjector Inject 1 mL (30 mg total) into the skin every 28 (twenty-eight) days. For 3 doses then every 8 weeks 1 mL 9   budesonide -formoterol  (SYMBICORT ) 80-4.5 MCG/ACT inhaler Take 2 puffs first thing in am and then another 2 puffs about 12 hours later. 1 each 12   CALCIUM  MAGNESIUM  ZINC PO Take 1 tablet by mouth daily.     Cholecalciferol  (VITAMIN D -3) 25 MCG (1000 UT) CAPS Take 1,000 Units by mouth daily.     clobetasol (TEMOVATE) 0.05 % external solution Apply 1 Application topically 2 (two) times daily as needed.     cromolyn  (OPTICROM ) 4 % ophthalmic solution Place 2 drops into both eyes 4 (four) times daily. 10 mL 5   [employer] Omega-3 Krill Oil 350 MG CAPS Take 350 mg by mouth daily.     Cyanocobalamin  (B-12 PO) Place 1 drop under the tongue daily.     dexlansoprazole  (DEXILANT ) 60 MG capsule TAKE 1 CAPSULE(60 MG) BY MOUTH DAILY 30 capsule 5   diclofenac  sodium (VOLTAREN ) 1 % GEL Apply 2 g topically 4 (four) times daily as needed (pain).     dicyclomine  (BENTYL ) 10 MG capsule Take 1 capsule (10 mg total) by mouth 4 (four) times daily -  before meals and at bedtime. 360 capsule 3   diphenoxylate -atropine  (LOMOTIL ) 2.5-0.025 MG tablet TAKE (1) TABLET BY MOUTH (3) TIMES DAILY AS NEEDED. 60 tablet 1   EPINEPHrine  (EPIPEN  2-PAK) 0.3 mg/0.3 mL IJ SOAJ injection Inject 0.3 mg into the muscle as needed for anaphylaxis. 2 each 1   fluticasone  (FLONASE ) 50 MCG/ACT nasal spray Place 2 sprays into both nostrils daily as needed (for a stuffy nose). 16 g 5   folic acid  (FOLVITE ) 1 MG tablet Take 1 mg by mouth daily.     gabapentin  (NEURONTIN ) 100 MG capsule Take 200-300 mg by mouth See admin instructions. Take 200mg  by mouth every day in the morning and take 300mg  by mouth at bedtime     Glucosamine-Chondroitin (GLUCOSAMINE CHONDR COMPLEX PO) Take 6 tablets by mouth daily. Glucosamine 1500mg  and Chondroitin sulfate 1200mg      lamoTRIgine  (LAMICTAL ) 100 MG tablet TAKE (1) TABLET BY MOUTH TWICE DAILY. 180 tablet 2   LORazepam  (ATIVAN ) 0.5 MG tablet Take 0.5 tablets (0.25 mg total) by mouth daily as needed for anxiety. 15 tablet 2   losartan  (COZAAR ) 100 MG tablet Take 1 tablet (100 mg total) by mouth daily. 90 tablet 2   metroNIDAZOLE  (METROGEL ) 1 % gel Apply 1 application  topically at bedtime.     montelukast  (SINGULAIR ) 10 MG tablet Take 1  tablet (10 mg total) by mouth at bedtime. 30 tablet 11   Multiple Vitamins-Minerals (HAIR SKIN AND NAILS FORMULA) TABS Take 2 tablets by mouth daily.     omega-3 acid ethyl esters (LOVAZA) 1 g capsule Take 1 capsule by mouth 2 (two) times daily.     ONETOUCH VERIO test strip 1 each by Other route as needed.     rosuvastatin (CRESTOR) 20 MG tablet Take 20 mg by mouth daily.     venlafaxine  XR (EFFEXOR -XR) 150 MG 24 hr capsule TAKE 2 CAPSULES BY MOUTH EVERY DAY 180 capsule 1   vitamin E 400 UNIT capsule Take 400 Units daily by mouth.     METHOTREXATE PO Take 6 tablets by mouth once a week.     Current Facility-Administered Medications  Medication Dose Route Frequency Provider Last Rate Last Admin   benralizumab  (FASENRA ) prefilled syringe 30 mg  30  mg Subcutaneous Q28 days Iva Marty Saltness, MD   30 mg at 07/15/24 1613    Past Medical History:  Diagnosis Date   Anxiety    Chronic pain    COPD (chronic obstructive pulmonary disease) (HCC)    Depression    Diabetes mellitus without complication (HCC)    Fibromyalgia    Fibromyalgia    GERD (gastroesophageal reflux disease)    Headache    Hepatitis C    History of blood transfusion    History of bronchitis    IBS (irritable bowel syndrome)    Osteoarthritis    Pneumonia    Prediabetes    Shortness of breath dyspnea    allergies; increased pain;    Vertigo    Wears glasses     Past Surgical History:  Procedure Laterality Date   BIOPSY  09/16/2020   Procedure: BIOPSY;  Surgeon: Eartha Angelia Sieving, MD;  [facility]: [facility];  Service: Gastroenterology;;   BIOPSY  07/11/2022   Procedure: BIOPSY;  Surgeon: Cindie Carlin POUR, DO;  [facility]: [facility];  Service: Endoscopy;;   CATARACT EXTRACTION W/PHACO Right 08/01/2017   Procedure: CATARACT EXTRACTION PHACO AND INTRAOCULAR LENS PLACEMENT RIGHT EYE;  Surgeon: Roz Anes, MD;  Location: [facility];  Service: Ophthalmology;  Laterality: Right;  CDE: 3.30   CATARACT  EXTRACTION W/PHACO Left 08/15/2017   Procedure: CATARACT EXTRACTION PHACO AND INTRAOCULAR LENS PLACEMENT (IOC);  Surgeon: Roz Anes, MD;  Location: [facility];  Service: Ophthalmology;  Laterality: Left;  CDE: 4.00   CHOLECYSTECTOMY     COLONOSCOPY     COLONOSCOPY N/A 10/23/2014   Procedure: COLONOSCOPY;  Surgeon: Claudis RAYMOND Rivet, MD;  [facility]: [facility];  Service: Endoscopy;  Laterality: N/A;  1030   COLONOSCOPY WITH PROPOFOL  N/A 07/11/2022   Procedure: COLONOSCOPY WITH PROPOFOL ;  Surgeon: Cindie Carlin POUR, DO;  [facility]: [facility];  Service: Endoscopy;  Laterality: N/A;  2:30pm, asa 3   ESOPHAGOGASTRODUODENOSCOPY N/A 10/23/2014   Procedure: ESOPHAGOGASTRODUODENOSCOPY (EGD);  Surgeon: Claudis RAYMOND Rivet, MD;  [facility]: [facility];  Service: Endoscopy;  Laterality: N/A;   ESOPHAGOGASTRODUODENOSCOPY (EGD) WITH PROPOFOL  N/A 09/16/2020   Procedure: ESOPHAGOGASTRODUODENOSCOPY (EGD) WITH PROPOFOL ;  Surgeon: Eartha Angelia Sieving, MD;  [facility]: [facility];  Service: Gastroenterology;  Laterality: N/A;  7:30   KNEE ARTHROSCOPY Left 10/21/2015   Procedure: ARTHROSCOPY LEFT KNEE WITH MENICAL DEBRIDEMENT, Chondroplasty;  Surgeon: Dempsey Moan, MD;  Location: [facility];  Service: Orthopedics;  Laterality: Left;   MANDIBLE SURGERY  09/06/1979   ORIF WRIST FRACTURE Right 07/23/2013   Procedure: OPEN REDUCTION INTERNAL FIXATION (ORIF) WRIST FRACTURE;  Surgeon: Elsie Mussel, MD;  Location: [facility];  Service: Orthopedics;  Laterality: Right;   SAVORY DILATION  09/16/2020   Procedure: SAVORY DILATION;  Surgeon: Eartha Angelia, Sieving, MD;  [facility]: [facility];  Service: Gastroenterology;;   TONSILLECTOMY     TOTAL KNEE ARTHROPLASTY     2011 rt knee   TOTAL KNEE ARTHROPLASTY Left 04/16/2018   Procedure: LEFT TOTAL KNEE ARTHROPLASTY;  Surgeon: Moan Dempsey, MD;  Location: [facility];  Service: Orthopedics;  Laterality: Left;   UPPER GASTROINTESTINAL ENDOSCOPY      Family History  Problem  Relation Age of Onset   Alcohol abuse Father    Diabetes Father    Stroke Father    Depression Father    Hypertension Father    Heart attack Father    High Cholesterol Father    Alcohol abuse Maternal Grandfather  Alcohol abuse Maternal Grandmother    Hypertension Maternal Grandmother    Alcohol abuse Paternal Grandfather    Alcohol abuse Paternal Grandmother    Diabetes Paternal Grandmother    Stroke Mother    Irritable bowel syndrome Mother    Sexual abuse Mother    Hypertension Mother    Breast cancer Mother    Diabetes Brother    Healthy Brother    Diabetes Brother    Heart disease Brother    Sarcoidosis Brother    Heart failure Brother    Anxiety disorder Paternal Uncle    Alcohol abuse Paternal Uncle    Alcohol abuse Cousin    Anxiety disorder Maternal Uncle    ADD / ADHD Neg Hx    Bipolar disorder Neg Hx    Dementia Neg Hx    Drug abuse Neg Hx    Paranoid behavior Neg Hx    Schizophrenia Neg Hx    Seizures Neg Hx    Physical abuse Neg Hx     Allergies as of 08/15/2024 - Review Complete 08/15/2024  Allergen Reaction Noted   Elavil  [amitriptyline ] Other (See Comments) 10/10/2012   Flagyl  [metronidazole ] Itching 02/14/2012   Vistaril  [hydroxyzine  hcl] Other (See Comments) 07/25/2012   Geodon [ziprasidone hydrochloride] Other (See Comments) 07/19/2011   Lactose intolerance (gi) Other (See Comments) 07/19/2011   Latex Other (See Comments) 10/12/2015   Other Other (See Comments) 10/19/2020    Social History   Socioeconomic History   Marital status: Widowed    Spouse name: Not on file   Number of children: Not on file   Years of education: Not on file   Highest education level: Not on file  Occupational History   Not on file  Tobacco Use   Smoking status: Former    Current packs/day: 0.00    Average packs/day: 2.0 packs/day for 15.0 years (30.0 ttl pk-yrs)    Types: Cigarettes    Start date: 09/06/1967    Quit date: 09/05/1982    Years since quitting:  41.9    Passive exposure: Never   Smokeless tobacco: Never  Vaping Use   Vaping status: Never Used  Substance and Sexual Activity   Alcohol use: No    Alcohol/week: 0.0 standard drinks of alcohol    Comment: history of alcoholism; pt states has been sober 28 years   Drug use: No   Sexual activity: Never  Other Topics Concern   Not on file  Social History Narrative   Not on file   Social Drivers of Health   Tobacco Use: Medium Risk (08/15/2024)   Patient History    Smoking Tobacco Use: Former    Smokeless Tobacco Use: Never    Passive Exposure: Never  Programmer, Applications: Not on [employer] Insecurity: Not on file  Transportation Needs: Not on file  Physical Activity: Not on file  Stress: Not on file  Social Connections: Not on file  Depression (PHQ2-9): High Risk (05/14/2024)   Depression (PHQ2-9)    PHQ-2 Score: 11  Alcohol Screen: Not on file  Housing: Not on file  Utilities: Not on file  Health Literacy: Not on file    Review of Systems   Gen: Denies fever, chills, anorexia. Denies fatigue, weakness, weight loss.  CV: Denies chest pain, palpitations, syncope, peripheral edema, and claudication. Resp: Denies dyspnea at rest, cough, wheezing, coughing up blood, and pleurisy. GI: See HPI Derm: + itching. Denies rash, dry skin Psych: Denies depression, anxiety, memory loss, confusion.  Heme: Denies bruising, bleeding, and enlarged lymph nodes.  Physical Exam   BP 132/66 (BP Location: Right Arm, Patient Position: Sitting, Cuff Size: Normal)   Pulse 98   Temp 97.6 F (36.4 C) (Temporal)   Ht 5' 5 (1.651 m)   Wt 155 lb (70.3 kg)   BMI 25.79 kg/m   General:   Alert and oriented. No distress noted. Pleasant and cooperative.  Head:  Normocephalic and atraumatic. Eyes:  Conjuctiva clear without scleral icterus. Mouth:  UTA - mask in place.  Abdomen:  +BS, soft, non-tender and non-distended. No rebound or guarding. No HSM or masses noted. Rectal:  deferred Msk:  Symmetrical without gross deformities. Normal posture. Extremities:  Without edema. Neurologic:  Alert and  oriented x4 Psych:  Alert and cooperative. Normal mood and affect.  Assessment & Plan  Lauren [name] is a 73 y.o. female presenting today for follow-up of her IBS and discuss some fecal smearing    Mixed irritable bowel syndrome Intermittent diarrhea and constipation with episodes of mushy stools and occasional hard stools. Symptoms exacerbated by dietary triggers such as xylitol and stress. Current management includes dietary modifications and fiber supplementation. Probiotics and dicyclomine  used as needed for symptom control. Discussed the potential for alternating constipation and diarrhea, and the role of fiber in managing both symptoms. Consideration of cholestyramine or colestipol if fiber is insufficient, but advised against due to risk of severe constipation. - Continue dietary modifications to avoid known triggers such as xylitol. - Increase psyllium fiber supplementation to daily, starting with a tablespoon and increasing to twice daily if tolerated. - Use Miralax  nightly for constipation and adjusting as necessary. - Use dicyclomine  as needed for cramping and diarrhea, but avoid frequent use to prevent constipation. - Avoid Lomotil  unless severe diarrhea occurs. - Consider steaming or cooking raw vegetables to aid digestion.  Fecal smearing Likely due to retention of stool in the rectal vault and possible hemorrhoidal tissue. Symptoms include unexpected bowel movements and concern about incontinence/smearing. Discussed the role of fiber in bulkier stools to reduce smearing and the potential use of cholestyramine or colestipol for diarrhea if fiber is insufficient. - Increase psyllium fiber supplementation to bulk stools and reduce smearing. - Use Miralax  as needed to improve stool consistency and reduce smearing while increasing frequency. - Monitor for  improvement in symptoms with increased fiber intake.     GERD Well controlled on Dexilant  60 mg once daily.    Follow up   Follow up 3 months.    Charmaine Melia, MSN, FNP-BC, AGACNP-BC [facility]

## 2024-08-15 NOTE — Patient Instructions (Addendum)
 Continue to only use dicyclomine  as needed when having abdominal cramping or severe diarrhea.  Frequent high-dose use of dicyclomine  can cause constipation therefore would you to be aware of this.  Given you have been able to control most of your diarrhea with avoidance of dietary triggers like sugar substitutes and dairy allergy  to continue to follow those and keep notes of anything that is commonly causing episodes of diarrhea.  Based on your reported symptoms today I feel as though you may be experiencing more constipation than diarrhea therefore I want you to start increasing your intake of fiber and if you are having small little balls of stool and having to strain for bowel movements or having smearing in her underwear then I want you to add MiraLAX  17 g (1 capful) nightly for several nights in a row.  If you find this helpful without causing severe diarrhea I want you to continue using it.  Please do not use any Lomotil  unless you are having more than 6 bowel movements in a day along with abdominal pain.  For your reflux please continue taking Dexilant  60 mg once daily.  Will plan to follow-up in 3 months.  It was a pleasure to see you today. I want to create trusting relationships with patients. If you receive a survey regarding your visit,  I greatly appreciate you taking time to fill this out on paper or through your MyChart. I value your feedback.  Charmaine Melia, MSN, FNP-BC, AGACNP-BC [facility]

## 2024-08-16 ENCOUNTER — Ambulatory Visit

## 2024-08-16 DIAGNOSIS — J4489 Other specified chronic obstructive pulmonary disease: Secondary | ICD-10-CM | POA: Diagnosis not present

## 2024-08-20 ENCOUNTER — Other Ambulatory Visit: Payer: Self-pay | Admitting: Gastroenterology

## 2024-08-21 ENCOUNTER — Ambulatory Visit (INDEPENDENT_AMBULATORY_CARE_PROVIDER_SITE_OTHER): Admitting: Psychiatry

## 2024-08-21 ENCOUNTER — Encounter (HOSPITAL_COMMUNITY): Payer: Self-pay | Admitting: Psychiatry

## 2024-08-21 VITALS — BP 120/76 | HR 81 | Ht 66.0 in | Wt 151.2 lb

## 2024-08-21 DIAGNOSIS — F411 Generalized anxiety disorder: Secondary | ICD-10-CM | POA: Diagnosis not present

## 2024-08-21 DIAGNOSIS — F332 Major depressive disorder, recurrent severe without psychotic features: Secondary | ICD-10-CM | POA: Diagnosis not present

## 2024-08-21 MED ORDER — LAMOTRIGINE 100 MG PO TABS: 100.0000 mg | ORAL_TABLET | Freq: Two times a day (BID) | ORAL | 2 refills | Status: AC

## 2024-08-21 MED ORDER — LORAZEPAM 0.5 MG PO TABS: 0.2500 mg | ORAL_TABLET | Freq: Every day | ORAL | 2 refills | Status: AC | PRN

## 2024-08-21 MED ORDER — VENLAFAXINE HCL ER 150 MG PO CP24: 300.0000 mg | ORAL_CAPSULE | Freq: Every day | ORAL | 1 refills | Status: AC

## 2024-08-21 NOTE — Progress Notes (Signed)
 [facility] MD/PA/NP OP Progress Note  08/21/2024 1:42 PM Lauren [name]  MRN:  [id number]  Chief Complaint:  Chief Complaint  Patient presents with   Depression   Anxiety   Follow-up   HPI: This patient is a 73 year old widowed white female who lives alone in [place]. She has 1 stepchild. Another son killed himself several years ago. The patient is on disability for fibromyalgia.   The patient returns for follow-up after about 11 months regarding her major depression generalized anxiety and insomnia.  She states overall she is doing well.  She is trying to stay away from crowds as she does not want to catch COVID.  However she does visit with her neighbor and talks to friends through [place].  She is also sponsoring 2 people.  She states that her mood has been stable and she denies significant depression anxiety thoughts of self-harm or suicide.  Her affect is bright.  She is eating and sleeping well. Visit Diagnosis:    ICD-10-CM   1. Generalized anxiety disorder  F41.1     2. Major depressive disorder, recurrent, severe without psychotic features (HCC)  F33.2       Past Psychiatric History: Long history of depression and mood instability.  She was hospitalized about 9 years ago for suicidal ideation  Past Medical History:  Past Medical History:  Diagnosis Date   Anxiety    Chronic pain    COPD (chronic obstructive pulmonary disease) (HCC)    Depression    Diabetes mellitus without complication (HCC)    Fibromyalgia    Fibromyalgia    GERD (gastroesophageal reflux disease)    Headache    Hepatitis C    History of blood transfusion    History of bronchitis    IBS (irritable bowel syndrome)    Osteoarthritis    Pneumonia    Prediabetes    Shortness of breath dyspnea    allergies; increased pain;    Vertigo    Wears glasses     Past Surgical History:  Procedure Laterality Date   BIOPSY  09/16/2020   Procedure: BIOPSY;  Surgeon: Eartha Angelia Sieving, MD;  [facility];  Service: Gastroenterology;;   BIOPSY  07/11/2022   Procedure: BIOPSY;  Surgeon: Cindie Carlin POUR, DO;  [facility];  Service: Endoscopy;;   CATARACT EXTRACTION W/PHACO Right 08/01/2017   Procedure: CATARACT EXTRACTION PHACO AND INTRAOCULAR LENS PLACEMENT RIGHT EYE;  Surgeon: Roz Anes, MD;  Location: [facility];  Service: Ophthalmology;  Laterality: Right;  CDE: 3.30   CATARACT EXTRACTION W/PHACO Left 08/15/2017   Procedure: CATARACT EXTRACTION PHACO AND INTRAOCULAR LENS PLACEMENT (IOC);  Surgeon: Roz Anes, MD;  Location: [facility];  Service: Ophthalmology;  Laterality: Left;  CDE: 4.00   CHOLECYSTECTOMY     COLONOSCOPY     COLONOSCOPY N/A 10/23/2014   Procedure: COLONOSCOPY;  Surgeon: Claudis RAYMOND Rivet, MD;  [facility];  Service: Endoscopy;  Laterality: N/A;  1030   COLONOSCOPY WITH PROPOFOL  N/A 07/11/2022   Procedure: COLONOSCOPY WITH PROPOFOL ;  Surgeon: Cindie Carlin POUR, DO;  [facility];  Service: Endoscopy;  Laterality: N/A;  2:30pm, asa 3   ESOPHAGOGASTRODUODENOSCOPY N/A 10/23/2014   Procedure: ESOPHAGOGASTRODUODENOSCOPY (EGD);  Surgeon: Claudis RAYMOND Rivet, MD;  [facility];  Service: Endoscopy;  Laterality: N/A;   ESOPHAGOGASTRODUODENOSCOPY (EGD) WITH PROPOFOL  N/A 09/16/2020   Procedure: ESOPHAGOGASTRODUODENOSCOPY (EGD) WITH PROPOFOL ;  Surgeon: Eartha Angelia Sieving, MD;  [facility];  Service: Gastroenterology;  Laterality: N/A;  7:30   KNEE ARTHROSCOPY Left 10/21/2015   Procedure: ARTHROSCOPY LEFT KNEE WITH MENICAL DEBRIDEMENT, Chondroplasty;  Surgeon: Dempsey Moan, MD;  Location: [facility];  Service: Orthopedics;  Laterality: Left;   MANDIBLE SURGERY  09/06/1979   ORIF WRIST FRACTURE Right 07/23/2013   Procedure: OPEN REDUCTION INTERNAL FIXATION (ORIF) WRIST FRACTURE;  Surgeon: Elsie Mussel, MD;  Location: [facility];  Service: Orthopedics;  Laterality: Right;   SAVORY DILATION  09/16/2020   Procedure: SAVORY DILATION;   Surgeon: Eartha Flavors, Toribio, MD;  [facility];  Service: Gastroenterology;;   TONSILLECTOMY     TOTAL KNEE ARTHROPLASTY     2011 rt knee   TOTAL KNEE ARTHROPLASTY Left 04/16/2018   Procedure: LEFT TOTAL KNEE ARTHROPLASTY;  Surgeon: Moan Dempsey, MD;  [facility];  Service: Orthopedics;  Laterality: Left;   UPPER GASTROINTESTINAL ENDOSCOPY      Family Psychiatric History: See below  Family History:  Family History  Problem Relation Age of Onset   Alcohol abuse Father    Diabetes Father    Stroke Father    Depression Father    Hypertension Father    Heart attack Father    High Cholesterol Father    Alcohol abuse Maternal Grandfather    Alcohol abuse Maternal Grandmother    Hypertension Maternal Grandmother    Alcohol abuse Paternal Grandfather    Alcohol abuse Paternal Grandmother    Diabetes Paternal Grandmother    Stroke Mother    Irritable bowel syndrome Mother    Sexual abuse Mother    Hypertension Mother    Breast cancer Mother    Diabetes Brother    Healthy Brother    Diabetes Brother    Heart disease Brother    Sarcoidosis Brother    Heart failure Brother    Anxiety disorder Paternal Uncle    Alcohol abuse Paternal Uncle    Alcohol abuse Cousin    Anxiety disorder Maternal Uncle    ADD / ADHD Neg Hx    Bipolar disorder Neg Hx    Dementia Neg Hx    Drug abuse Neg Hx    Paranoid behavior Neg Hx    Schizophrenia Neg Hx    Seizures Neg Hx    Physical abuse Neg Hx     Social History:  Social History   Socioeconomic History   Marital status: Widowed    Spouse name: Not on file   Number of children: Not on file   Years of education: Not on file   Highest education level: Not on file  Occupational History   Not on file  Tobacco Use   Smoking status: Former    Current packs/day: 0.00    Average packs/day: 2.0 packs/day for 15.0 years (30.0 ttl pk-yrs)    Types: Cigarettes    Start date: 09/06/1967    Quit date: 09/05/1982    Years  since quitting: 41.9    Passive exposure: Never   Smokeless tobacco: Never  Vaping Use   Vaping status: Never Used  Substance and Sexual Activity   Alcohol use: No    Alcohol/week: 0.0 standard drinks of alcohol    Comment: history of alcoholism; pt states has been sober 28 years   Drug use: No   Sexual activity: Never  Other Topics Concern   Not on file  Social History Narrative   Not on file   Social Drivers of Health   Tobacco Use: Medium Risk (08/21/2024)   Patient History  Smoking Tobacco Use: Former    Smokeless Tobacco Use: Never    Passive Exposure: Never  Programmer, Applications: Not on file  Food Insecurity: Not on file  Transportation Needs: Not on file  Physical Activity: Not on file  Stress: Not on file  Social Connections: Not on file  Depression (PHQ2-9): High Risk (05/14/2024)   Depression (PHQ2-9)    PHQ-2 Score: 11  Alcohol Screen: Not on file  Housing: Not on file  Utilities: Not on file  Health Literacy: Not on file    Allergies: Allergies[1]  Metabolic Disorder Labs: Lab Results  Component Value Date   HGBA1C 6.1 (H) 10/13/2015   MPG 128 10/13/2015   No results found for: PROLACTIN No results found for: CHOL, TRIG, HDL, CHOLHDL, VLDL, LDLCALC Lab Results  Component Value Date   TSH 2.400 03/19/2014    Therapeutic Level Labs: No results found for: LITHIUM No results found for: VALPROATE No results found for: CBMZ  Current Medications: Current Outpatient Medications  Medication Sig Dispense Refill   acetaminophen  (TYLENOL ) 500 MG tablet Take 500 mg by mouth every 6 (six) hours as needed for moderate pain.     albuterol  (VENTOLIN  HFA) 108 (90 Base) MCG/ACT inhaler Inhale 2 puffs into the lungs every 4 (four) hours as needed for wheezing or shortness of breath. 8.5 g 3   benralizumab  (FASENRA  PEN) 30 MG/ML prefilled autoinjector Inject 1 mL (30 mg total) into the skin every 28 (twenty-eight) days. For 3 doses then  every 8 weeks 1 mL 9   budesonide -formoterol  (SYMBICORT ) 80-4.5 MCG/ACT inhaler Take 2 puffs first thing in am and then another 2 puffs about 12 hours later. 1 each 12   CALCIUM  MAGNESIUM  ZINC PO Take 1 tablet by mouth daily.     Cholecalciferol (VITAMIN D -3) 25 MCG (1000 UT) CAPS Take 1,000 Units by mouth daily.     clobetasol (TEMOVATE) 0.05 % external solution Apply 1 Application topically 2 (two) times daily as needed.     cromolyn  (OPTICROM ) 4 % ophthalmic solution Place 2 drops into both eyes 4 (four) times daily. 10 mL 5   [employer] Omega-3 Krill Oil 350 MG CAPS Take 350 mg by mouth daily.     Cyanocobalamin  (B-12 PO) Place 1 drop under the tongue daily.     dexlansoprazole  (DEXILANT ) 60 MG capsule TAKE 1 CAPSULE(60 MG) BY MOUTH DAILY 90 capsule 3   diclofenac sodium (VOLTAREN) 1 % GEL Apply 2 g topically 4 (four) times daily as needed (pain).     dicyclomine  (BENTYL ) 10 MG capsule Take 1 capsule (10 mg total) by mouth 4 (four) times daily -  before meals and at bedtime. 360 capsule 3   diphenoxylate -atropine  (LOMOTIL ) 2.5-0.025 MG tablet TAKE (1) TABLET BY MOUTH (3) TIMES DAILY AS NEEDED. 60 tablet 1   EPINEPHrine  (EPIPEN  2-PAK) 0.3 mg/0.3 mL IJ SOAJ injection Inject 0.3 mg into the muscle as needed for anaphylaxis. 2 each 1   fluticasone  (FLONASE ) 50 MCG/ACT nasal spray Place 2 sprays into both nostrils daily as needed (for a stuffy nose). 16 g 5   folic acid  (FOLVITE ) 1 MG tablet Take 1 mg by mouth daily.     gabapentin  (NEURONTIN ) 100 MG capsule Take 200-300 mg by mouth See admin instructions. Take 200mg  by mouth every day in the morning and take 300mg  by mouth at bedtime     Glucosamine-Chondroitin (GLUCOSAMINE CHONDR COMPLEX PO) Take 6 tablets by mouth daily. Glucosamine 1500mg  and Chondroitin sulfate 1200mg   losartan  (COZAAR ) 100 MG tablet Take 1 tablet (100 mg total) by mouth daily. 90 tablet 2   METHOTREXATE PO Take 6 tablets by mouth once a week.     metroNIDAZOLE (METROGEL) 1 % gel  Apply 1 application  topically at bedtime.     montelukast  (SINGULAIR ) 10 MG tablet Take 1 tablet (10 mg total) by mouth at bedtime. 30 tablet 11   Multiple Vitamins-Minerals (HAIR SKIN AND NAILS FORMULA) TABS Take 2 tablets by mouth daily.     omega-3 acid ethyl esters (LOVAZA) 1 g capsule Take 1 capsule by mouth 2 (two) times daily.     ONETOUCH VERIO test strip 1 each by Other route as needed.     rosuvastatin (CRESTOR) 20 MG tablet Take 20 mg by mouth daily.     vitamin E 400 UNIT capsule Take 400 Units daily by mouth.     lamoTRIgine  (LAMICTAL ) 100 MG tablet Take 1 tablet (100 mg total) by mouth 2 (two) times daily. 180 tablet 2   LORazepam  (ATIVAN ) 0.5 MG tablet Take 0.5 tablets (0.25 mg total) by mouth daily as needed for anxiety. 30 tablet 2   venlafaxine  XR (EFFEXOR -XR) 150 MG 24 hr capsule Take 2 capsules (300 mg total) by mouth daily. 180 capsule 1   Current Facility-Administered Medications  Medication Dose Route Frequency Provider Last Rate Last Admin   benralizumab  (FASENRA ) prefilled syringe 30 mg  30 mg Subcutaneous Q28 days Iva Marty Saltness, MD   30 mg at 08/16/24 1356     Musculoskeletal: Strength & Muscle Tone: within normal limits Gait & Station: normal Patient leans: N/A  Psychiatric Specialty Exam: Review of Systems  All other systems reviewed and are negative.   Blood pressure 120/76, pulse 81, height 5' 6 (1.676 m), weight 151 lb 3.2 oz (68.6 kg), SpO2 93%.Body mass index is 24.4 kg/m.  General Appearance: Casual and Fairly Groomed  Eye Contact:  Good  Speech:  Clear and Coherent  Volume:  Normal  Mood:  Euthymic  Affect:  Congruent  Thought Process:  Goal Directed  Orientation:  Full (Time, Place, and Person)  Thought Content: WDL   Suicidal Thoughts:  No  Homicidal Thoughts:  No  Memory:  Immediate;   Good Recent;   Good Remote;   Good  Judgement:  Good  Insight:  Good  Psychomotor Activity:  Normal  Concentration:  Concentration: Good and  Attention Span: Good  Recall:  Good  Fund of Knowledge: Good  Language: Good  Akathisia:  No  Handed:  Right  AIMS (if indicated): not done  Assets:  Communication Skills Desire for Improvement Physical Health Resilience Social Support Talents/Skills  ADL's:  Intact  Cognition: WNL  Sleep:  Good   Screenings: AUDIT    Flowsheet Row [facility]  Alcohol Use Disorder Identification Test Final Score (AUDIT) 0   GAD-7    Flowsheet Row Counselor from 05/14/2024 in [facility] [place] Counselor from 04/30/2024 in [facility] [place] Office Visit from 05/09/2023 in [facility] [place] Counselor from 02/23/2023 in [facility] [place] Office Visit from 02/09/2023 in [facility] [place]  Total GAD-7 Score 11 5 11 11 16    PHQ2-9    Flowsheet Row Counselor from 05/14/2024 in [facility] [place] Counselor from 04/30/2024 in [facility]  [place] Counselor from 02/21/2024 in [facility] [place] Counselor from 12/28/2023 in [facility] [place] Counselor from 12/14/2023 in [facility] [place] Total Score 2 2 4 1 3   PHQ-9 Total Score 11 8 14 8 15    Flowsheet Row Counselor from 05/14/2024 in [facility] [place] Counselor from 02/23/2023 in [facility] [place] Admission (Discharged) from 07/11/2022 in [facility]  C-SSRS RISK CATEGORY Low Risk Low Risk No Risk     Assessment and Plan: This patient is a 73 year old female with a history of major depression generalized anxiety and possible bipolar disorder.  She is  doing well on her current regimen.  She will continue Lamictal  100 mg twice daily for mood stabilization, Effexor  XR 300 mg daily for major depression and lorazepam  0.25 mg daily as needed for anxiety or sleep.  She will return to see me in 6 months  Collaboration of Care: Collaboration of Care: Referral or follow-up with counselor/therapist AEB patient will continue therapy with Winton Rubinstein in our office  Patient/Guardian was advised Release of Information must be obtained prior to any record release in order to collaborate their care with an outside provider. Patient/Guardian was advised if they have not already done so to contact the registration department to sign all necessary forms in order for us  to release information regarding their care.   Consent: Patient/Guardian gives verbal consent for treatment and assignment of benefits for services provided during this visit. Patient/Guardian expressed understanding and agreed to proceed.    Barnie Gull, MD 08/21/2024, 1:42 PM     [1]  Allergies Allergen Reactions   Elavil  [Amitriptyline ] Other (See Comments)    Vivid dreams and almost suicidal   Flagyl [Metronidazole] Itching   Vistaril  [Hydroxyzine  Hcl] Other (See Comments)    Got higher than a kite on too much of this.   Geodon [Ziprasidone Hydrochloride] Other (See Comments)    Blacked out   Lactose Intolerance (Gi) Other (See Comments)    GI upset   Latex Other (See Comments)    Red at site   Other Other (See Comments)

## 2024-08-23 ENCOUNTER — Ambulatory Visit (INDEPENDENT_AMBULATORY_CARE_PROVIDER_SITE_OTHER)

## 2024-08-23 DIAGNOSIS — J302 Other seasonal allergic rhinitis: Secondary | ICD-10-CM | POA: Diagnosis not present

## 2024-08-23 DIAGNOSIS — J309 Allergic rhinitis, unspecified: Secondary | ICD-10-CM

## 2024-08-27 ENCOUNTER — Ambulatory Visit (HOSPITAL_COMMUNITY): Admitting: Psychiatry

## 2024-08-27 DIAGNOSIS — F411 Generalized anxiety disorder: Secondary | ICD-10-CM

## 2024-08-27 DIAGNOSIS — F339 Major depressive disorder, recurrent, unspecified: Secondary | ICD-10-CM

## 2024-08-27 DIAGNOSIS — F332 Major depressive disorder, recurrent severe without psychotic features: Secondary | ICD-10-CM
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
## 2024-09-04 ENCOUNTER — Other Ambulatory Visit: Payer: Self-pay | Admitting: *Deleted

## 2024-09-04 MED ORDER — FASENRA PEN 30 MG/ML ~~LOC~~ SOAJ: 30.0000 mg | SUBCUTANEOUS | 6 refills | Status: AC

## 2024-09-12 ENCOUNTER — Ambulatory Visit (INDEPENDENT_AMBULATORY_CARE_PROVIDER_SITE_OTHER): Admitting: Psychiatry

## 2024-09-12 DIAGNOSIS — F332 Major depressive disorder, recurrent severe without psychotic features: Secondary | ICD-10-CM

## 2024-09-12 DIAGNOSIS — F411 Generalized anxiety disorder: Secondary | ICD-10-CM

## 2024-09-12 DIAGNOSIS — F339 Major depressive disorder, recurrent, unspecified: Secondary | ICD-10-CM
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
## 2024-09-13 ENCOUNTER — Ambulatory Visit

## 2024-09-13 ENCOUNTER — Encounter: Payer: Self-pay | Admitting: Internal Medicine

## 2024-09-13 DIAGNOSIS — J302 Other seasonal allergic rhinitis: Secondary | ICD-10-CM

## 2024-09-13 DIAGNOSIS — J4489 Other specified chronic obstructive pulmonary disease: Secondary | ICD-10-CM | POA: Diagnosis not present

## 2024-09-20 ENCOUNTER — Ambulatory Visit (INDEPENDENT_AMBULATORY_CARE_PROVIDER_SITE_OTHER)

## 2024-09-20 DIAGNOSIS — J302 Other seasonal allergic rhinitis: Secondary | ICD-10-CM | POA: Diagnosis not present

## 2024-09-26 ENCOUNTER — Ambulatory Visit (HOSPITAL_COMMUNITY): Admitting: Psychiatry

## 2024-09-26 DIAGNOSIS — F411 Generalized anxiety disorder: Secondary | ICD-10-CM

## 2024-09-26 DIAGNOSIS — F332 Major depressive disorder, recurrent severe without psychotic features: Secondary | ICD-10-CM

## 2024-09-26 DIAGNOSIS — F339 Major depressive disorder, recurrent, unspecified: Secondary | ICD-10-CM | POA: Diagnosis not present
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
## 2024-09-27 ENCOUNTER — Ambulatory Visit (INDEPENDENT_AMBULATORY_CARE_PROVIDER_SITE_OTHER)

## 2024-09-27 DIAGNOSIS — J302 Other seasonal allergic rhinitis: Secondary | ICD-10-CM

## 2024-10-11 ENCOUNTER — Ambulatory Visit

## 2024-10-11 DIAGNOSIS — J302 Other seasonal allergic rhinitis: Secondary | ICD-10-CM

## 2024-10-23 ENCOUNTER — Ambulatory Visit (HOSPITAL_COMMUNITY): Admitting: Psychiatry

## 2024-11-06 ENCOUNTER — Ambulatory Visit (HOSPITAL_COMMUNITY): Admitting: Psychiatry

## 2024-11-08 ENCOUNTER — Ambulatory Visit

## 2024-11-14 ENCOUNTER — Ambulatory Visit: Admitting: Gastroenterology

## 2024-11-20 ENCOUNTER — Ambulatory Visit (HOSPITAL_COMMUNITY): Admitting: Psychiatry

## 2024-12-04 ENCOUNTER — Ambulatory Visit (HOSPITAL_COMMUNITY): Admitting: Psychiatry

## 2024-12-25 ENCOUNTER — Ambulatory Visit: Admitting: Allergy & Immunology

## 2025-02-19 ENCOUNTER — Ambulatory Visit (HOSPITAL_COMMUNITY): Admitting: Psychiatry
